# Patient Record
Sex: Male | Born: 1940 | Race: Black or African American | Hispanic: No | Smoking: Never smoker
Health system: Southern US, Community
[De-identification: ages and names within clinical notes are randomized; demographics above are authoritative.]

## PROBLEM LIST (undated history)

## (undated) DIAGNOSIS — Z992 Dependence on renal dialysis: Secondary | ICD-10-CM

## (undated) DIAGNOSIS — I10 Essential (primary) hypertension: Secondary | ICD-10-CM

## (undated) DIAGNOSIS — N189 Chronic kidney disease, unspecified: Secondary | ICD-10-CM

## (undated) DIAGNOSIS — N289 Disorder of kidney and ureter, unspecified: Secondary | ICD-10-CM

## (undated) DIAGNOSIS — C801 Malignant (primary) neoplasm, unspecified: Secondary | ICD-10-CM

## (undated) DIAGNOSIS — E119 Type 2 diabetes mellitus without complications: Secondary | ICD-10-CM

## (undated) DIAGNOSIS — A480 Gas gangrene: Principal | ICD-10-CM

## (undated) HISTORY — PX: PERITONEAL CATHETER INSERTION: SHX2223

## (undated) HISTORY — PX: EYE SURGERY: SHX253

## (undated) HISTORY — DX: Gas gangrene: A48.0

---

## 2011-01-28 ENCOUNTER — Inpatient Hospital Stay: Payer: Self-pay | Admitting: Internal Medicine

## 2011-12-01 ENCOUNTER — Ambulatory Visit: Payer: Self-pay | Admitting: Oncology

## 2011-12-06 LAB — CBC CANCER CENTER
Basophil #: 0.2 x10 3/mm — ABNORMAL HIGH (ref 0.0–0.1)
Basophil %: 0.7 %
Eosinophil #: 0.1 x10 3/mm (ref 0.0–0.7)
Eosinophil %: 0.4 %
HCT: 35.4 % — ABNORMAL LOW (ref 40.0–52.0)
HGB: 11.4 g/dL — ABNORMAL LOW (ref 13.0–18.0)
Lymphocyte #: 19.6 x10 3/mm — ABNORMAL HIGH (ref 1.0–3.6)
Lymphocyte %: 80.8 %
MCH: 28.5 pg (ref 26.0–34.0)
MCHC: 32.2 g/dL (ref 32.0–36.0)
MCV: 89 fL (ref 80–100)
Monocyte #: 0.9 x10 3/mm (ref 0.2–1.0)
Monocyte %: 3.7 %
Neutrophil #: 3.5 x10 3/mm (ref 1.4–6.5)
Neutrophil %: 14.4 %
Platelet: 114 x10 3/mm — ABNORMAL LOW (ref 150–440)
RBC: 4 10*6/uL — ABNORMAL LOW (ref 4.40–5.90)
RDW: 13.8 % (ref 11.5–14.5)
WBC: 24.3 x10 3/mm — ABNORMAL HIGH (ref 3.8–10.6)

## 2011-12-06 LAB — LACTATE DEHYDROGENASE: LDH: 320 U/L — ABNORMAL HIGH (ref 87–241)

## 2011-12-12 ENCOUNTER — Ambulatory Visit: Payer: Self-pay | Admitting: Nephrology

## 2011-12-12 LAB — COMPREHENSIVE METABOLIC PANEL
Albumin: 3.4 g/dL (ref 3.4–5.0)
Alkaline Phosphatase: 97 U/L (ref 50–136)
Anion Gap: 9 (ref 7–16)
BUN: 39 mg/dL — ABNORMAL HIGH (ref 7–18)
Bilirubin,Total: 0.3 mg/dL (ref 0.2–1.0)
Calcium, Total: 8.5 mg/dL (ref 8.5–10.1)
Chloride: 111 mmol/L — ABNORMAL HIGH (ref 98–107)
Co2: 22 mmol/L (ref 21–32)
Creatinine: 2.28 mg/dL — ABNORMAL HIGH (ref 0.60–1.30)
EGFR (African American): 32 — ABNORMAL LOW
EGFR (Non-African Amer.): 28 — ABNORMAL LOW
Glucose: 262 mg/dL — ABNORMAL HIGH (ref 65–99)
Osmolality: 302 (ref 275–301)
Potassium: 5 mmol/L (ref 3.5–5.1)
SGOT(AST): 36 U/L (ref 15–37)
SGPT (ALT): 54 U/L
Sodium: 142 mmol/L (ref 136–145)
Total Protein: 6.4 g/dL (ref 6.4–8.2)

## 2011-12-12 LAB — URINALYSIS, COMPLETE
Bacteria: NONE SEEN
Bilirubin,UR: NEGATIVE
Glucose,UR: 150 mg/dL (ref 0–75)
Granular Cast: 1
Hyaline Cast: 6
Ketone: NEGATIVE
Leukocyte Esterase: NEGATIVE
Nitrite: NEGATIVE
Ph: 5 (ref 4.5–8.0)
Protein: 100
RBC,UR: 4 /HPF (ref 0–5)
Specific Gravity: 1.011 (ref 1.003–1.030)
Squamous Epithelial: NONE SEEN
WBC UR: 1 /HPF (ref 0–5)

## 2011-12-12 LAB — CBC WITH DIFFERENTIAL/PLATELET
Basophil #: 0 10*3/uL (ref 0.0–0.1)
Basophil %: 0.1 %
Eosinophil #: 0.1 10*3/uL (ref 0.0–0.7)
Eosinophil %: 0.5 %
HCT: 34 % — ABNORMAL LOW (ref 40.0–52.0)
HGB: 10.8 g/dL — ABNORMAL LOW (ref 13.0–18.0)
Lymphocyte #: 21.2 10*3/uL — ABNORMAL HIGH (ref 1.0–3.6)
Lymphocyte %: 78.1 %
MCH: 28.2 pg (ref 26.0–34.0)
MCHC: 31.7 g/dL — ABNORMAL LOW (ref 32.0–36.0)
MCV: 89 fL (ref 80–100)
Monocyte #: 0.9 x10 3/mm (ref 0.2–1.0)
Monocyte %: 3.3 %
Neutrophil #: 4.9 10*3/uL (ref 1.4–6.5)
Neutrophil %: 18 %
Platelet: 115 10*3/uL — ABNORMAL LOW (ref 150–440)
RBC: 3.82 10*6/uL — ABNORMAL LOW (ref 4.40–5.90)
RDW: 13.6 % (ref 11.5–14.5)
WBC: 27.1 10*3/uL — ABNORMAL HIGH (ref 3.8–10.6)

## 2011-12-12 LAB — PROTEIN / CREATININE RATIO, URINE
Creatinine, Urine: 110.1 mg/dL (ref 30.0–125.0)
Protein, Random Urine: 136 mg/dL — ABNORMAL HIGH (ref 0–12)
Protein/Creat. Ratio: 1235 mg/gCREAT — ABNORMAL HIGH (ref 0–200)

## 2011-12-12 LAB — APTT: Activated PTT: 29.3 secs (ref 23.6–35.9)

## 2011-12-12 LAB — PROTIME-INR
INR: 1
Prothrombin Time: 13.5 secs (ref 11.5–14.7)

## 2011-12-14 ENCOUNTER — Observation Stay: Payer: Self-pay | Admitting: Nephrology

## 2011-12-14 LAB — HEMOGLOBIN: HGB: 10.1 g/dL — ABNORMAL LOW (ref 13.0–18.0)

## 2011-12-15 LAB — BASIC METABOLIC PANEL
Anion Gap: 10 (ref 7–16)
BUN: 52 mg/dL — ABNORMAL HIGH (ref 7–18)
Calcium, Total: 8 mg/dL — ABNORMAL LOW (ref 8.5–10.1)
Chloride: 116 mmol/L — ABNORMAL HIGH (ref 98–107)
Co2: 21 mmol/L (ref 21–32)
Creatinine: 2.76 mg/dL — ABNORMAL HIGH (ref 0.60–1.30)
EGFR (African American): 26 — ABNORMAL LOW
EGFR (Non-African Amer.): 22 — ABNORMAL LOW
Glucose: 157 mg/dL — ABNORMAL HIGH (ref 65–99)
Osmolality: 310 (ref 275–301)
Potassium: 5.6 mmol/L — ABNORMAL HIGH (ref 3.5–5.1)
Sodium: 147 mmol/L — ABNORMAL HIGH (ref 136–145)

## 2011-12-15 LAB — URINALYSIS, COMPLETE
Bacteria: NONE SEEN
Bilirubin,UR: NEGATIVE
Glucose,UR: 50 mg/dL (ref 0–75)
Ketone: NEGATIVE
Leukocyte Esterase: NEGATIVE
Nitrite: NEGATIVE
Ph: 5 (ref 4.5–8.0)
Protein: 100
RBC,UR: NONE SEEN /HPF (ref 0–5)
Specific Gravity: 1.01 (ref 1.003–1.030)
Squamous Epithelial: NONE SEEN
WBC UR: 1 /HPF (ref 0–5)

## 2011-12-15 LAB — CBC WITH DIFFERENTIAL/PLATELET
Comment - H1-Com1: NORMAL
Comment - H1-Com2: NORMAL
HCT: 28 % — ABNORMAL LOW (ref 40.0–52.0)
HGB: 9.2 g/dL — ABNORMAL LOW (ref 13.0–18.0)
Lymphocytes: 57 %
MCH: 28.9 pg (ref 26.0–34.0)
MCHC: 32.8 g/dL (ref 32.0–36.0)
MCV: 88 fL (ref 80–100)
Monocytes: 7 %
Platelet: 92 10*3/uL — ABNORMAL LOW (ref 150–440)
RBC: 3.17 10*6/uL — ABNORMAL LOW (ref 4.40–5.90)
RDW: 13.4 % (ref 11.5–14.5)
Segmented Neutrophils: 8 %
Variant Lymphocyte - H1-Rlymph: 28 %
WBC: 20.2 10*3/uL — ABNORMAL HIGH (ref 3.8–10.6)

## 2011-12-16 ENCOUNTER — Ambulatory Visit: Payer: Self-pay | Admitting: Oncology

## 2012-01-15 ENCOUNTER — Ambulatory Visit: Payer: Self-pay | Admitting: Oncology

## 2012-11-13 ENCOUNTER — Ambulatory Visit: Payer: Self-pay | Admitting: Oncology

## 2012-11-14 ENCOUNTER — Ambulatory Visit: Payer: Self-pay | Admitting: Oncology

## 2012-12-11 LAB — CANCER CENTER HEMOGLOBIN: HGB: 9.3 g/dL — ABNORMAL LOW (ref 13.0–18.0)

## 2012-12-11 LAB — IRON AND TIBC
Iron Bind.Cap.(Total): 272 ug/dL (ref 250–450)
Iron Saturation: 25 %
Iron: 69 ug/dL (ref 65–175)
Unbound Iron-Bind.Cap.: 203 ug/dL

## 2012-12-11 LAB — FOLATE: Folic Acid: 11 ng/mL (ref 3.1–100.0)

## 2012-12-11 LAB — FERRITIN: Ferritin (ARMC): 50 ng/mL (ref 8–388)

## 2012-12-15 ENCOUNTER — Ambulatory Visit: Payer: Self-pay | Admitting: Oncology

## 2012-12-29 LAB — BASIC METABOLIC PANEL
Anion Gap: 6 — ABNORMAL LOW (ref 7–16)
BUN: 48 mg/dL — ABNORMAL HIGH (ref 7–18)
Calcium, Total: 8.2 mg/dL — ABNORMAL LOW (ref 8.5–10.1)
Chloride: 116 mmol/L — ABNORMAL HIGH (ref 98–107)
Co2: 22 mmol/L (ref 21–32)
Creatinine: 3.65 mg/dL — ABNORMAL HIGH (ref 0.60–1.30)
EGFR (African American): 18 — ABNORMAL LOW
EGFR (Non-African Amer.): 16 — ABNORMAL LOW
Glucose: 139 mg/dL — ABNORMAL HIGH (ref 65–99)
Osmolality: 302 (ref 275–301)
Potassium: 5 mmol/L (ref 3.5–5.1)
Sodium: 144 mmol/L (ref 136–145)

## 2012-12-29 LAB — CBC
HCT: 29 % — ABNORMAL LOW (ref 40.0–52.0)
HGB: 9.4 g/dL — ABNORMAL LOW (ref 13.0–18.0)
MCH: 27.6 pg (ref 26.0–34.0)
MCHC: 32.3 g/dL (ref 32.0–36.0)
MCV: 86 fL (ref 80–100)
Platelet: 118 10*3/uL — ABNORMAL LOW (ref 150–440)
RBC: 3.39 10*6/uL — ABNORMAL LOW (ref 4.40–5.90)
RDW: 15.6 % — ABNORMAL HIGH (ref 11.5–14.5)
WBC: 20.3 10*3/uL — ABNORMAL HIGH (ref 3.8–10.6)

## 2012-12-29 LAB — TROPONIN I: Troponin-I: 0.06 ng/mL — ABNORMAL HIGH

## 2012-12-29 LAB — CK TOTAL AND CKMB (NOT AT ARMC)
CK, Total: 675 U/L — ABNORMAL HIGH (ref 35–232)
CK-MB: 13.6 ng/mL — ABNORMAL HIGH (ref 0.5–3.6)

## 2012-12-30 ENCOUNTER — Observation Stay: Payer: Self-pay | Admitting: Internal Medicine

## 2012-12-30 LAB — CBC WITH DIFFERENTIAL/PLATELET
Basophil #: 0.1 10*3/uL (ref 0.0–0.1)
Basophil %: 0.3 %
Eosinophil #: 0.1 10*3/uL (ref 0.0–0.7)
Eosinophil %: 0.6 %
HCT: 27.9 % — ABNORMAL LOW (ref 40.0–52.0)
HGB: 9 g/dL — ABNORMAL LOW (ref 13.0–18.0)
Lymphocyte #: 17.9 10*3/uL — ABNORMAL HIGH (ref 1.0–3.6)
Lymphocyte %: 77 %
MCH: 27.8 pg (ref 26.0–34.0)
MCHC: 32.4 g/dL (ref 32.0–36.0)
MCV: 86 fL (ref 80–100)
Monocyte #: 1.1 x10 3/mm — ABNORMAL HIGH (ref 0.2–1.0)
Monocyte %: 4.9 %
Neutrophil #: 4 10*3/uL (ref 1.4–6.5)
Neutrophil %: 17.2 %
Platelet: 110 10*3/uL — ABNORMAL LOW (ref 150–440)
RBC: 3.24 10*6/uL — ABNORMAL LOW (ref 4.40–5.90)
RDW: 15.5 % — ABNORMAL HIGH (ref 11.5–14.5)
WBC: 23.2 10*3/uL — ABNORMAL HIGH (ref 3.8–10.6)

## 2012-12-30 LAB — BASIC METABOLIC PANEL
Anion Gap: 6 — ABNORMAL LOW (ref 7–16)
Anion Gap: 6 — ABNORMAL LOW (ref 7–16)
BUN: 45 mg/dL — ABNORMAL HIGH (ref 7–18)
BUN: 41 mg/dL — ABNORMAL HIGH (ref 7–18)
Calcium, Total: 8.1 mg/dL — ABNORMAL LOW (ref 8.5–10.1)
Calcium, Total: 8.2 mg/dL — ABNORMAL LOW (ref 8.5–10.1)
Chloride: 117 mmol/L — ABNORMAL HIGH (ref 98–107)
Chloride: 116 mmol/L — ABNORMAL HIGH (ref 98–107)
Co2: 21 mmol/L (ref 21–32)
Co2: 20 mmol/L — ABNORMAL LOW (ref 21–32)
Creatinine: 3.42 mg/dL — ABNORMAL HIGH (ref 0.60–1.30)
Creatinine: 3.19 mg/dL — ABNORMAL HIGH (ref 0.60–1.30)
EGFR (African American): 20 — ABNORMAL LOW
EGFR (African American): 21 — ABNORMAL LOW
EGFR (Non-African Amer.): 17 — ABNORMAL LOW
EGFR (Non-African Amer.): 18 — ABNORMAL LOW
Glucose: 124 mg/dL — ABNORMAL HIGH (ref 65–99)
Glucose: 145 mg/dL — ABNORMAL HIGH (ref 65–99)
Osmolality: 300 (ref 275–301)
Osmolality: 296 (ref 275–301)
Potassium: 4.9 mmol/L (ref 3.5–5.1)
Potassium: 4.9 mmol/L (ref 3.5–5.1)
Sodium: 144 mmol/L (ref 136–145)
Sodium: 142 mmol/L (ref 136–145)

## 2012-12-30 LAB — URINALYSIS, COMPLETE
Bacteria: NONE SEEN
Bilirubin,UR: NEGATIVE
Glucose,UR: 50 mg/dL (ref 0–75)
Ketone: NEGATIVE
Leukocyte Esterase: NEGATIVE
Nitrite: NEGATIVE
Ph: 5 (ref 4.5–8.0)
Protein: >=500
RBC,UR: 6 /HPF (ref 0–5)
Specific Gravity: 1.012 (ref 1.003–1.030)
Squamous Epithelial: <1
WBC UR: <1 /HPF (ref 0–5)

## 2012-12-30 LAB — TROPONIN I
Troponin-I: 0.05 ng/mL
Troponin-I: 0.04 ng/mL

## 2012-12-30 LAB — CK TOTAL AND CKMB (NOT AT ARMC)
CK, Total: 616 U/L — ABNORMAL HIGH (ref 35–232)
CK, Total: 626 U/L — ABNORMAL HIGH (ref 35–232)
CK-MB: 13.3 ng/mL — ABNORMAL HIGH (ref 0.5–3.6)
CK-MB: 13.8 ng/mL — ABNORMAL HIGH (ref 0.5–3.6)

## 2012-12-31 LAB — BASIC METABOLIC PANEL
Anion Gap: 8 (ref 7–16)
Anion Gap: 5 — ABNORMAL LOW (ref 7–16)
BUN: 45 mg/dL — ABNORMAL HIGH (ref 7–18)
BUN: 43 mg/dL — ABNORMAL HIGH (ref 7–18)
Calcium, Total: 8.3 mg/dL — ABNORMAL LOW (ref 8.5–10.1)
Calcium, Total: 8.2 mg/dL — ABNORMAL LOW (ref 8.5–10.1)
Chloride: 114 mmol/L — ABNORMAL HIGH (ref 98–107)
Chloride: 114 mmol/L — ABNORMAL HIGH (ref 98–107)
Co2: 18 mmol/L — ABNORMAL LOW (ref 21–32)
Co2: 21 mmol/L (ref 21–32)
Creatinine: 3.34 mg/dL — ABNORMAL HIGH (ref 0.60–1.30)
Creatinine: 3.23 mg/dL — ABNORMAL HIGH (ref 0.60–1.30)
EGFR (African American): 20 — ABNORMAL LOW
EGFR (African American): 21 — ABNORMAL LOW
EGFR (Non-African Amer.): 17 — ABNORMAL LOW
EGFR (Non-African Amer.): 18 — ABNORMAL LOW
Glucose: 149 mg/dL — ABNORMAL HIGH (ref 65–99)
Glucose: 177 mg/dL — ABNORMAL HIGH (ref 65–99)
Osmolality: 294 (ref 275–301)
Osmolality: 295 (ref 275–301)
Potassium: 5.4 mmol/L — ABNORMAL HIGH (ref 3.5–5.1)
Potassium: 5.3 mmol/L — ABNORMAL HIGH (ref 3.5–5.1)
Sodium: 140 mmol/L (ref 136–145)
Sodium: 140 mmol/L (ref 136–145)

## 2012-12-31 LAB — PRO B NATRIURETIC PEPTIDE: B-Type Natriuretic Peptide: 1574 pg/mL — ABNORMAL HIGH (ref 0–125)

## 2013-01-01 LAB — BASIC METABOLIC PANEL
Anion Gap: 7 (ref 7–16)
BUN: 41 mg/dL — ABNORMAL HIGH (ref 7–18)
Calcium, Total: 8.2 mg/dL — ABNORMAL LOW (ref 8.5–10.1)
Chloride: 114 mmol/L — ABNORMAL HIGH (ref 98–107)
Co2: 23 mmol/L (ref 21–32)
Creatinine: 3.28 mg/dL — ABNORMAL HIGH (ref 0.60–1.30)
EGFR (African American): 21 — ABNORMAL LOW
EGFR (Non-African Amer.): 18 — ABNORMAL LOW
Glucose: 122 mg/dL — ABNORMAL HIGH (ref 65–99)
Osmolality: 298 (ref 275–301)
Potassium: 4.7 mmol/L (ref 3.5–5.1)
Sodium: 144 mmol/L (ref 136–145)

## 2013-01-14 ENCOUNTER — Ambulatory Visit: Payer: Self-pay | Admitting: Oncology

## 2013-01-27 ENCOUNTER — Observation Stay: Payer: Self-pay | Admitting: Internal Medicine

## 2013-01-27 LAB — URINALYSIS, COMPLETE
Bilirubin,UR: NEGATIVE
Glucose,UR: 150 mg/dL (ref 0–75)
Hyaline Cast: 22
Ketone: NEGATIVE
Leukocyte Esterase: NEGATIVE
Nitrite: NEGATIVE
Ph: 5 (ref 4.5–8.0)
Protein: >=500
RBC,UR: 15 /HPF (ref 0–5)
Specific Gravity: 1.013 (ref 1.003–1.030)
Squamous Epithelial: NONE SEEN
WBC UR: 2 /HPF (ref 0–5)

## 2013-01-27 LAB — COMPREHENSIVE METABOLIC PANEL
Albumin: 3 g/dL — ABNORMAL LOW (ref 3.4–5.0)
Alkaline Phosphatase: 70 U/L (ref 50–136)
Anion Gap: 3 — ABNORMAL LOW (ref 7–16)
BUN: 48 mg/dL — ABNORMAL HIGH (ref 7–18)
Bilirubin,Total: 0.3 mg/dL (ref 0.2–1.0)
Calcium, Total: 8.4 mg/dL — ABNORMAL LOW (ref 8.5–10.1)
Chloride: 109 mmol/L — ABNORMAL HIGH (ref 98–107)
Co2: 27 mmol/L (ref 21–32)
Creatinine: 4.28 mg/dL — ABNORMAL HIGH (ref 0.60–1.30)
EGFR (African American): 15 — ABNORMAL LOW
EGFR (Non-African Amer.): 13 — ABNORMAL LOW
Glucose: 205 mg/dL — ABNORMAL HIGH (ref 65–99)
Osmolality: 296 (ref 275–301)
Potassium: 5.6 mmol/L — ABNORMAL HIGH (ref 3.5–5.1)
SGOT(AST): 15 U/L (ref 15–37)
SGPT (ALT): 25 U/L (ref 12–78)
Sodium: 139 mmol/L (ref 136–145)
Total Protein: 5.9 g/dL — ABNORMAL LOW (ref 6.4–8.2)

## 2013-01-27 LAB — TROPONIN I: Troponin-I: 0.04 ng/mL

## 2013-01-27 LAB — CBC
HCT: 32.8 % — ABNORMAL LOW (ref 40.0–52.0)
HGB: 10.4 g/dL — ABNORMAL LOW (ref 13.0–18.0)
MCH: 26.9 pg (ref 26.0–34.0)
MCHC: 31.8 g/dL — ABNORMAL LOW (ref 32.0–36.0)
MCV: 85 fL (ref 80–100)
Platelet: 120 10*3/uL — ABNORMAL LOW (ref 150–440)
RBC: 3.87 10*6/uL — ABNORMAL LOW (ref 4.40–5.90)
RDW: 15.3 % — ABNORMAL HIGH (ref 11.5–14.5)
WBC: 22.3 10*3/uL — ABNORMAL HIGH (ref 3.8–10.6)

## 2013-01-27 LAB — HEMOGLOBIN A1C: Hemoglobin A1C: 7.5 % — ABNORMAL HIGH (ref 4.2–6.3)

## 2013-01-27 LAB — CK TOTAL AND CKMB (NOT AT ARMC)
CK, Total: 307 U/L — ABNORMAL HIGH (ref 35–232)
CK-MB: 7.3 ng/mL — ABNORMAL HIGH (ref 0.5–3.6)

## 2013-01-27 LAB — LIPASE, BLOOD: Lipase: 362 U/L (ref 73–393)

## 2013-01-28 LAB — BASIC METABOLIC PANEL
Anion Gap: 4 — ABNORMAL LOW (ref 7–16)
Anion Gap: 6 — ABNORMAL LOW (ref 7–16)
BUN: 49 mg/dL — ABNORMAL HIGH (ref 7–18)
BUN: 48 mg/dL — ABNORMAL HIGH (ref 7–18)
Calcium, Total: 7.9 mg/dL — ABNORMAL LOW (ref 8.5–10.1)
Calcium, Total: 8.1 mg/dL — ABNORMAL LOW (ref 8.5–10.1)
Chloride: 109 mmol/L — ABNORMAL HIGH (ref 98–107)
Chloride: 108 mmol/L — ABNORMAL HIGH (ref 98–107)
Co2: 26 mmol/L (ref 21–32)
Co2: 24 mmol/L (ref 21–32)
Creatinine: 4.13 mg/dL — ABNORMAL HIGH (ref 0.60–1.30)
Creatinine: 3.82 mg/dL — ABNORMAL HIGH (ref 0.60–1.30)
EGFR (African American): 16 — ABNORMAL LOW
EGFR (African American): 17 — ABNORMAL LOW
EGFR (Non-African Amer.): 13 — ABNORMAL LOW
EGFR (Non-African Amer.): 15 — ABNORMAL LOW
Glucose: 152 mg/dL — ABNORMAL HIGH (ref 65–99)
Glucose: 169 mg/dL — ABNORMAL HIGH (ref 65–99)
Osmolality: 293 (ref 275–301)
Osmolality: 292 (ref 275–301)
Potassium: 5.6 mmol/L — ABNORMAL HIGH (ref 3.5–5.1)
Potassium: 5.4 mmol/L — ABNORMAL HIGH (ref 3.5–5.1)
Sodium: 139 mmol/L (ref 136–145)
Sodium: 138 mmol/L (ref 136–145)

## 2013-01-28 LAB — CBC WITH DIFFERENTIAL/PLATELET
Comment - H1-Com2: NORMAL
Eosinophil: 2 %
HCT: 29 % — ABNORMAL LOW (ref 40.0–52.0)
HGB: 9.5 g/dL — ABNORMAL LOW (ref 13.0–18.0)
Lymphocytes: 77 %
MCH: 27.4 pg (ref 26.0–34.0)
MCHC: 32.6 g/dL (ref 32.0–36.0)
MCV: 84 fL (ref 80–100)
Monocytes: 2 %
Platelet: 100 10*3/uL — ABNORMAL LOW (ref 150–440)
RBC: 3.45 10*6/uL — ABNORMAL LOW (ref 4.40–5.90)
RDW: 15.2 % — ABNORMAL HIGH (ref 11.5–14.5)
Segmented Neutrophils: 15 %
Variant Lymphocyte - H1-Rlymph: 4 %
WBC: 18.9 10*3/uL — ABNORMAL HIGH (ref 3.8–10.6)

## 2013-01-28 LAB — MAGNESIUM: Magnesium: 2.2 mg/dL

## 2013-01-29 LAB — BASIC METABOLIC PANEL
Anion Gap: 5 — ABNORMAL LOW (ref 7–16)
BUN: 47 mg/dL — ABNORMAL HIGH (ref 7–18)
Calcium, Total: 8 mg/dL — ABNORMAL LOW (ref 8.5–10.1)
Chloride: 111 mmol/L — ABNORMAL HIGH (ref 98–107)
Co2: 25 mmol/L (ref 21–32)
Creatinine: 3.65 mg/dL — ABNORMAL HIGH (ref 0.60–1.30)
EGFR (African American): 18 — ABNORMAL LOW
EGFR (Non-African Amer.): 16 — ABNORMAL LOW
Glucose: 133 mg/dL — ABNORMAL HIGH (ref 65–99)
Osmolality: 295 (ref 275–301)
Potassium: 4.9 mmol/L (ref 3.5–5.1)
Sodium: 141 mmol/L (ref 136–145)

## 2013-02-13 ENCOUNTER — Ambulatory Visit: Payer: Self-pay | Admitting: Family Medicine

## 2013-05-06 ENCOUNTER — Ambulatory Visit: Payer: Self-pay | Admitting: Oncology

## 2013-05-06 LAB — CBC CANCER CENTER
Basophil #: 0 x10 3/mm (ref 0.0–0.1)
Basophil %: 0.3 %
Eosinophil #: 0.1 x10 3/mm (ref 0.0–0.7)
Eosinophil %: 0.9 %
HCT: 20.6 % — ABNORMAL LOW (ref 40.0–52.0)
HGB: 6.6 g/dL — ABNORMAL LOW (ref 13.0–18.0)
Lymphocyte #: 10.6 x10 3/mm — ABNORMAL HIGH (ref 1.0–3.6)
Lymphocyte %: 70.5 %
MCH: 27.9 pg (ref 26.0–34.0)
MCHC: 32.3 g/dL (ref 32.0–36.0)
MCV: 87 fL (ref 80–100)
Monocyte #: 1 x10 3/mm (ref 0.2–1.0)
Monocyte %: 6.4 %
Neutrophil #: 3.3 x10 3/mm (ref 1.4–6.5)
Neutrophil %: 21.9 %
Platelet: 134 x10 3/mm — ABNORMAL LOW (ref 150–440)
RBC: 2.38 10*6/uL — ABNORMAL LOW (ref 4.40–5.90)
RDW: 17.6 % — ABNORMAL HIGH (ref 11.5–14.5)
WBC: 15 x10 3/mm — ABNORMAL HIGH (ref 3.8–10.6)

## 2013-05-06 LAB — IRON AND TIBC
Iron Bind.Cap.(Total): 257 ug/dL (ref 250–450)
Iron Saturation: 23 %
Iron: 60 ug/dL — ABNORMAL LOW (ref 65–175)
Unbound Iron-Bind.Cap.: 197 ug/dL

## 2013-05-06 LAB — FERRITIN: Ferritin (ARMC): 120 ng/mL (ref 8–388)

## 2013-05-07 ENCOUNTER — Ambulatory Visit: Payer: Self-pay | Admitting: Family Medicine

## 2013-05-08 ENCOUNTER — Ambulatory Visit: Payer: Self-pay | Admitting: Vascular Surgery

## 2013-05-08 LAB — CBC
HCT: 24.7 % — ABNORMAL LOW (ref 40.0–52.0)
HGB: 8.1 g/dL — ABNORMAL LOW (ref 13.0–18.0)
MCH: 28 pg (ref 26.0–34.0)
MCHC: 32.7 g/dL (ref 32.0–36.0)
MCV: 85 fL (ref 80–100)
Platelet: 135 10*3/uL — ABNORMAL LOW (ref 150–440)
RBC: 2.89 10*6/uL — ABNORMAL LOW (ref 4.40–5.90)
RDW: 18.1 % — ABNORMAL HIGH (ref 11.5–14.5)
WBC: 17.4 10*3/uL — ABNORMAL HIGH (ref 3.8–10.6)

## 2013-05-08 LAB — BASIC METABOLIC PANEL
Anion Gap: 7 (ref 7–16)
BUN: 65 mg/dL — ABNORMAL HIGH (ref 7–18)
Calcium, Total: 8.5 mg/dL (ref 8.5–10.1)
Chloride: 112 mmol/L — ABNORMAL HIGH (ref 98–107)
Co2: 23 mmol/L (ref 21–32)
Creatinine: 4.79 mg/dL — ABNORMAL HIGH (ref 0.60–1.30)
EGFR (African American): 13 — ABNORMAL LOW
EGFR (Non-African Amer.): 11 — ABNORMAL LOW
Glucose: 190 mg/dL — ABNORMAL HIGH (ref 65–99)
Osmolality: 307 (ref 275–301)
Potassium: 5 mmol/L (ref 3.5–5.1)
Sodium: 142 mmol/L (ref 136–145)

## 2013-05-14 ENCOUNTER — Ambulatory Visit: Payer: Self-pay | Admitting: Vascular Surgery

## 2013-05-17 ENCOUNTER — Ambulatory Visit: Payer: Self-pay | Admitting: Oncology

## 2013-05-19 LAB — CBC CANCER CENTER
Basophil #: 0.1 x10 3/mm (ref 0.0–0.1)
Basophil %: 0.5 %
Eosinophil #: 0.1 x10 3/mm (ref 0.0–0.7)
Eosinophil %: 0.7 %
HCT: 24.1 % — ABNORMAL LOW (ref 40.0–52.0)
HGB: 7.9 g/dL — ABNORMAL LOW (ref 13.0–18.0)
Lymphocyte #: 11.6 x10 3/mm — ABNORMAL HIGH (ref 1.0–3.6)
Lymphocyte %: 67.3 %
MCH: 27.9 pg (ref 26.0–34.0)
MCHC: 32.5 g/dL (ref 32.0–36.0)
MCV: 86 fL (ref 80–100)
Monocyte #: 1 x10 3/mm (ref 0.2–1.0)
Monocyte %: 5.7 %
Neutrophil #: 4.5 x10 3/mm (ref 1.4–6.5)
Neutrophil %: 25.8 %
Platelet: 127 x10 3/mm — ABNORMAL LOW (ref 150–440)
RBC: 2.81 10*6/uL — ABNORMAL LOW (ref 4.40–5.90)
RDW: 16.7 % — ABNORMAL HIGH (ref 11.5–14.5)
WBC: 17.3 x10 3/mm — ABNORMAL HIGH (ref 3.8–10.6)

## 2013-06-03 ENCOUNTER — Inpatient Hospital Stay: Payer: Self-pay | Admitting: Internal Medicine

## 2013-06-03 LAB — COMPREHENSIVE METABOLIC PANEL
Albumin: 3 g/dL — ABNORMAL LOW (ref 3.4–5.0)
Alkaline Phosphatase: 75 U/L (ref 50–136)
Anion Gap: 7 (ref 7–16)
BUN: 83 mg/dL — ABNORMAL HIGH (ref 7–18)
Bilirubin,Total: 0.3 mg/dL (ref 0.2–1.0)
Calcium, Total: 8.1 mg/dL — ABNORMAL LOW (ref 8.5–10.1)
Chloride: 113 mmol/L — ABNORMAL HIGH (ref 98–107)
Co2: 20 mmol/L — ABNORMAL LOW (ref 21–32)
Creatinine: 6.3 mg/dL — ABNORMAL HIGH (ref 0.60–1.30)
EGFR (African American): 9 — ABNORMAL LOW
EGFR (Non-African Amer.): 8 — ABNORMAL LOW
Glucose: 186 mg/dL — ABNORMAL HIGH (ref 65–99)
Osmolality: 309 (ref 275–301)
Potassium: 6.7 mmol/L (ref 3.5–5.1)
SGOT(AST): 24 U/L (ref 15–37)
SGPT (ALT): 28 U/L (ref 12–78)
Sodium: 140 mmol/L (ref 136–145)
Total Protein: 5.7 g/dL — ABNORMAL LOW (ref 6.4–8.2)

## 2013-06-03 LAB — CBC WITH DIFFERENTIAL/PLATELET
Basophil #: 0.1 10*3/uL (ref 0.0–0.1)
Basophil %: 1.1 %
Eosinophil #: 0.1 10*3/uL (ref 0.0–0.7)
Eosinophil %: 0.9 %
HCT: 23.7 % — ABNORMAL LOW (ref 40.0–52.0)
HGB: 7.9 g/dL — ABNORMAL LOW (ref 13.0–18.0)
Lymphocyte #: 8.9 10*3/uL — ABNORMAL HIGH (ref 1.0–3.6)
Lymphocyte %: 67.4 %
MCH: 28.9 pg (ref 26.0–34.0)
MCHC: 33.4 g/dL (ref 32.0–36.0)
MCV: 87 fL (ref 80–100)
Monocyte #: 0.7 x10 3/mm (ref 0.2–1.0)
Monocyte %: 5.1 %
Neutrophil #: 3.4 10*3/uL (ref 1.4–6.5)
Neutrophil %: 25.5 %
Platelet: 119 10*3/uL — ABNORMAL LOW (ref 150–440)
RBC: 2.74 10*6/uL — ABNORMAL LOW (ref 4.40–5.90)
RDW: 17.3 % — ABNORMAL HIGH (ref 11.5–14.5)
WBC: 13.2 10*3/uL — ABNORMAL HIGH (ref 3.8–10.6)

## 2013-06-03 LAB — URINALYSIS, COMPLETE
Bacteria: NONE SEEN
Bilirubin,UR: NEGATIVE
Glucose,UR: 50 mg/dL (ref 0–75)
Hyaline Cast: 3
Ketone: NEGATIVE
Leukocyte Esterase: NEGATIVE
Nitrite: NEGATIVE
Ph: 5 (ref 4.5–8.0)
Protein: 100
RBC,UR: 2 /HPF (ref 0–5)
Specific Gravity: 1.009 (ref 1.003–1.030)
Squamous Epithelial: <1
WBC UR: 1 /HPF (ref 0–5)

## 2013-06-03 LAB — TROPONIN I
Troponin-I: 0.04 ng/mL
Troponin-I: 0.04 ng/mL

## 2013-06-03 LAB — POTASSIUM: Potassium: 6 mmol/L — ABNORMAL HIGH (ref 3.5–5.1)

## 2013-06-03 LAB — PRO B NATRIURETIC PEPTIDE: B-Type Natriuretic Peptide: 11011 pg/mL — ABNORMAL HIGH (ref 0–125)

## 2013-06-04 LAB — CBC WITH DIFFERENTIAL/PLATELET
Basophil #: 0.1 10*3/uL (ref 0.0–0.1)
Basophil %: 0.6 %
Eosinophil #: 0.1 10*3/uL (ref 0.0–0.7)
Eosinophil %: 0.6 %
HCT: 24.7 % — ABNORMAL LOW (ref 40.0–52.0)
HGB: 8 g/dL — ABNORMAL LOW (ref 13.0–18.0)
Lymphocyte #: 10.7 10*3/uL — ABNORMAL HIGH (ref 1.0–3.6)
Lymphocyte %: 68.4 %
MCH: 27.9 pg (ref 26.0–34.0)
MCHC: 32.2 g/dL (ref 32.0–36.0)
MCV: 87 fL (ref 80–100)
Monocyte #: 1 x10 3/mm (ref 0.2–1.0)
Monocyte %: 6.3 %
Neutrophil #: 3.8 10*3/uL (ref 1.4–6.5)
Neutrophil %: 24.1 %
Platelet: 120 10*3/uL — ABNORMAL LOW (ref 150–440)
RBC: 2.85 10*6/uL — ABNORMAL LOW (ref 4.40–5.90)
RDW: 16.9 % — ABNORMAL HIGH (ref 11.5–14.5)
WBC: 15.6 10*3/uL — ABNORMAL HIGH (ref 3.8–10.6)

## 2013-06-04 LAB — BASIC METABOLIC PANEL
Anion Gap: 7 (ref 7–16)
BUN: 81 mg/dL — ABNORMAL HIGH (ref 7–18)
Calcium, Total: 8.2 mg/dL — ABNORMAL LOW (ref 8.5–10.1)
Chloride: 114 mmol/L — ABNORMAL HIGH (ref 98–107)
Co2: 21 mmol/L (ref 21–32)
Creatinine: 6.27 mg/dL — ABNORMAL HIGH (ref 0.60–1.30)
EGFR (African American): 9 — ABNORMAL LOW
EGFR (Non-African Amer.): 8 — ABNORMAL LOW
Glucose: 120 mg/dL — ABNORMAL HIGH (ref 65–99)
Osmolality: 309 (ref 275–301)
Potassium: 5.8 mmol/L — ABNORMAL HIGH (ref 3.5–5.1)
Sodium: 142 mmol/L (ref 136–145)

## 2013-06-04 LAB — HEMOGLOBIN A1C: Hemoglobin A1C: 5.9 % (ref 4.2–6.3)

## 2013-06-04 LAB — LIPID PANEL
Cholesterol: 198 mg/dL (ref 0–200)
HDL Cholesterol: 44 mg/dL (ref 40–60)
Ldl Cholesterol, Calc: 137 mg/dL — ABNORMAL HIGH (ref 0–100)
Triglycerides: 87 mg/dL (ref 0–200)
VLDL Cholesterol, Calc: 17 mg/dL (ref 5–40)

## 2013-06-04 LAB — IRON AND TIBC
Iron Bind.Cap.(Total): 246 ug/dL — ABNORMAL LOW (ref 250–450)
Iron Saturation: 26 %
Iron: 63 ug/dL — ABNORMAL LOW (ref 65–175)
Unbound Iron-Bind.Cap.: 183 ug/dL

## 2013-06-04 LAB — FERRITIN: Ferritin (ARMC): 88 ng/mL (ref 8–388)

## 2013-06-04 LAB — PHOSPHORUS: Phosphorus: 5.5 mg/dL — ABNORMAL HIGH (ref 2.5–4.9)

## 2013-06-04 LAB — URIC ACID: Uric Acid: 6.7 mg/dL (ref 3.5–7.2)

## 2013-06-04 LAB — TROPONIN I: Troponin-I: 0.04 ng/mL

## 2013-06-05 LAB — BASIC METABOLIC PANEL
Anion Gap: 7 (ref 7–16)
BUN: 79 mg/dL — ABNORMAL HIGH (ref 7–18)
Calcium, Total: 8.3 mg/dL — ABNORMAL LOW (ref 8.5–10.1)
Chloride: 112 mmol/L — ABNORMAL HIGH (ref 98–107)
Co2: 23 mmol/L (ref 21–32)
Creatinine: 6.44 mg/dL — ABNORMAL HIGH (ref 0.60–1.30)
EGFR (African American): 9 — ABNORMAL LOW
EGFR (Non-African Amer.): 8 — ABNORMAL LOW
Glucose: 100 mg/dL — ABNORMAL HIGH (ref 65–99)
Osmolality: 307 (ref 275–301)
Potassium: 5.1 mmol/L (ref 3.5–5.1)
Sodium: 142 mmol/L (ref 136–145)

## 2013-06-16 ENCOUNTER — Ambulatory Visit: Payer: Self-pay | Admitting: Oncology

## 2013-07-07 ENCOUNTER — Ambulatory Visit: Payer: Self-pay | Admitting: Vascular Surgery

## 2013-07-17 HISTORY — PX: AV FISTULA PLACEMENT: SHX1204

## 2013-07-25 ENCOUNTER — Ambulatory Visit: Payer: Self-pay | Admitting: Vascular Surgery

## 2014-03-02 DIAGNOSIS — E11319 Type 2 diabetes mellitus with unspecified diabetic retinopathy without macular edema: Secondary | ICD-10-CM | POA: Insufficient documentation

## 2014-03-02 DIAGNOSIS — H4311 Vitreous hemorrhage, right eye: Secondary | ICD-10-CM | POA: Insufficient documentation

## 2014-04-11 ENCOUNTER — Inpatient Hospital Stay: Payer: Self-pay | Admitting: Internal Medicine

## 2014-04-11 LAB — URINALYSIS, COMPLETE
Bacteria: NONE SEEN
Bilirubin,UR: NEGATIVE
Glucose,UR: 150 mg/dL (ref 0–75)
Ketone: NEGATIVE
Leukocyte Esterase: NEGATIVE
Nitrite: NEGATIVE
Ph: 7 (ref 4.5–8.0)
Protein: >=500
RBC,UR: 7 /HPF (ref 0–5)
Specific Gravity: 1.012 (ref 1.003–1.030)
Squamous Epithelial: NONE SEEN
WBC UR: <1 /HPF (ref 0–5)

## 2014-04-11 LAB — CBC WITH DIFFERENTIAL/PLATELET
Basophil #: 0.2 10*3/uL — ABNORMAL HIGH (ref 0.0–0.1)
Basophil %: 0.8 %
Eosinophil #: 0.1 10*3/uL (ref 0.0–0.7)
Eosinophil %: 0.8 %
HCT: 38.1 % — ABNORMAL LOW (ref 40.0–52.0)
HGB: 12.1 g/dL — ABNORMAL LOW (ref 13.0–18.0)
Lymphocyte #: 11.8 10*3/uL — ABNORMAL HIGH (ref 1.0–3.6)
Lymphocyte %: 63.9 %
MCH: 29.7 pg (ref 26.0–34.0)
MCHC: 31.7 g/dL — ABNORMAL LOW (ref 32.0–36.0)
MCV: 94 fL (ref 80–100)
Monocyte #: 1.2 x10 3/mm — ABNORMAL HIGH (ref 0.2–1.0)
Monocyte %: 6.3 %
Neutrophil #: 5.2 10*3/uL (ref 1.4–6.5)
Neutrophil %: 28.2 %
Platelet: 149 10*3/uL — ABNORMAL LOW (ref 150–440)
RBC: 4.07 10*6/uL — ABNORMAL LOW (ref 4.40–5.90)
RDW: 19.2 % — ABNORMAL HIGH (ref 11.5–14.5)
WBC: 18.6 10*3/uL — ABNORMAL HIGH (ref 3.8–10.6)

## 2014-04-11 LAB — COMPREHENSIVE METABOLIC PANEL
Albumin: 3.2 g/dL — ABNORMAL LOW (ref 3.4–5.0)
Alkaline Phosphatase: 91 U/L
Anion Gap: 8 (ref 7–16)
BUN: 79 mg/dL — ABNORMAL HIGH (ref 7–18)
Bilirubin,Total: 0.6 mg/dL (ref 0.2–1.0)
Calcium, Total: 7.5 mg/dL — ABNORMAL LOW (ref 8.5–10.1)
Chloride: 109 mmol/L — ABNORMAL HIGH (ref 98–107)
Co2: 24 mmol/L (ref 21–32)
Creatinine: 10.4 mg/dL — ABNORMAL HIGH (ref 0.60–1.30)
EGFR (African American): 6 — ABNORMAL LOW
EGFR (Non-African Amer.): 5 — ABNORMAL LOW
Glucose: 113 mg/dL — ABNORMAL HIGH (ref 65–99)
Osmolality: 306 (ref 275–301)
Potassium: 5.1 mmol/L (ref 3.5–5.1)
SGOT(AST): 39 U/L — ABNORMAL HIGH (ref 15–37)
SGPT (ALT): 50 U/L
Sodium: 141 mmol/L (ref 136–145)
Total Protein: 6.4 g/dL (ref 6.4–8.2)

## 2014-04-11 LAB — CK-MB
CK-MB: 19.1 ng/mL — ABNORMAL HIGH (ref 0.5–3.6)
CK-MB: 16.8 ng/mL — ABNORMAL HIGH (ref 0.5–3.6)

## 2014-04-11 LAB — TROPONIN I
Troponin-I: 0.17 ng/mL — ABNORMAL HIGH
Troponin-I: 0.15 ng/mL — ABNORMAL HIGH

## 2014-04-11 LAB — LIPASE, BLOOD: Lipase: 173 U/L (ref 73–393)

## 2014-04-12 LAB — CBC WITH DIFFERENTIAL/PLATELET
Bands: 3 %
Comment - H1-Com2: NORMAL
Eosinophil: 1 %
HCT: 33.5 % — ABNORMAL LOW (ref 40.0–52.0)
HGB: 10.6 g/dL — ABNORMAL LOW (ref 13.0–18.0)
Lymphocytes: 69 %
MCH: 29.9 pg (ref 26.0–34.0)
MCHC: 31.8 g/dL — ABNORMAL LOW (ref 32.0–36.0)
MCV: 94 fL (ref 80–100)
Monocytes: 4 %
Platelet: 123 10*3/uL — ABNORMAL LOW (ref 150–440)
RBC: 3.56 10*6/uL — ABNORMAL LOW (ref 4.40–5.90)
RDW: 19.2 % — ABNORMAL HIGH (ref 11.5–14.5)
Segmented Neutrophils: 23 %
WBC: 17.6 10*3/uL — ABNORMAL HIGH (ref 3.8–10.6)

## 2014-04-12 LAB — BASIC METABOLIC PANEL
Anion Gap: 8 (ref 7–16)
BUN: 83 mg/dL — ABNORMAL HIGH (ref 7–18)
Calcium, Total: 7.2 mg/dL — ABNORMAL LOW (ref 8.5–10.1)
Chloride: 112 mmol/L — ABNORMAL HIGH (ref 98–107)
Co2: 22 mmol/L (ref 21–32)
Creatinine: 10.57 mg/dL — ABNORMAL HIGH (ref 0.60–1.30)
EGFR (African American): 6 — ABNORMAL LOW
EGFR (Non-African Amer.): 5 — ABNORMAL LOW
Glucose: 80 mg/dL (ref 65–99)
Osmolality: 307 (ref 275–301)
Potassium: 5.6 mmol/L — ABNORMAL HIGH (ref 3.5–5.1)
Sodium: 142 mmol/L (ref 136–145)

## 2014-04-12 LAB — CLOSTRIDIUM DIFFICILE(ARMC)

## 2014-04-12 LAB — TROPONIN I: Troponin-I: 0.16 ng/mL — ABNORMAL HIGH

## 2014-04-12 LAB — CK-MB: CK-MB: 13.7 ng/mL — ABNORMAL HIGH (ref 0.5–3.6)

## 2014-04-12 LAB — POTASSIUM: Potassium: 5.2 mmol/L — ABNORMAL HIGH (ref 3.5–5.1)

## 2014-04-13 LAB — BASIC METABOLIC PANEL
Anion Gap: 7 (ref 7–16)
BUN: 79 mg/dL — ABNORMAL HIGH (ref 7–18)
Calcium, Total: 6.8 mg/dL — CL (ref 8.5–10.1)
Chloride: 109 mmol/L — ABNORMAL HIGH (ref 98–107)
Co2: 24 mmol/L (ref 21–32)
Creatinine: 9.83 mg/dL — ABNORMAL HIGH (ref 0.60–1.30)
EGFR (African American): 7 — ABNORMAL LOW
EGFR (Non-African Amer.): 6 — ABNORMAL LOW
Glucose: 139 mg/dL — ABNORMAL HIGH (ref 65–99)
Osmolality: 305 (ref 275–301)
Potassium: 4.6 mmol/L (ref 3.5–5.1)
Sodium: 140 mmol/L (ref 136–145)

## 2014-04-15 LAB — STOOL CULTURE

## 2014-07-13 DIAGNOSIS — I10 Essential (primary) hypertension: Secondary | ICD-10-CM | POA: Insufficient documentation

## 2014-07-13 DIAGNOSIS — E1122 Type 2 diabetes mellitus with diabetic chronic kidney disease: Secondary | ICD-10-CM | POA: Insufficient documentation

## 2014-07-13 DIAGNOSIS — N186 End stage renal disease: Secondary | ICD-10-CM | POA: Insufficient documentation

## 2014-07-13 DIAGNOSIS — E1142 Type 2 diabetes mellitus with diabetic polyneuropathy: Secondary | ICD-10-CM | POA: Insufficient documentation

## 2014-07-13 DIAGNOSIS — N185 Chronic kidney disease, stage 5: Secondary | ICD-10-CM | POA: Insufficient documentation

## 2014-07-13 DIAGNOSIS — Z992 Dependence on renal dialysis: Secondary | ICD-10-CM

## 2014-11-06 NOTE — Consult Note (Signed)
Referring Physician:  Loletha Grayer :   Primary Care Physician:  [name] :   Reason for Consult: Admit Date: 03-Jun-2013  Chief Complaint: tremor  Reason for Consult: tremor   History of Present Illness: History of Present Illness:   74 year old man who is going to be started on peritoneal dialysis for ESRD presents with lethargy and fatigue and was found to have severe hyperkalemia with a potassium level at 6.7.  His Cr was also found to be elevated at 6.3.  Neurology asked to consult regarding tremor.  Patient says tremor in right hand worse than left.  Worse when using hand, such as using utensil or hold glass.  No resting tremor.  No head tremor.  Tremors come and go with activity.  Nothing makes them better in particular.  Each tremor is short lasting and lasts as long as the postural hand activity is going on for.  Denies family history of similar tremor.  No pain in the arms.  No recent arm injuries.  Not sure how long his tremor has been going on for.   MEDICAL HISTORY: End-stage renal disease. hyperparathyroidism, acidosis,shingles, of chronic disease.  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 with his wife who has cancer.  HISTORY: 61, living, with diabetes and hypertension. died at 26 of COPD. He was a smoker.  calcitriol 0.25 mcg daily, 12.5 mg twice a day, 0.3 mg twice a day, 20 mg daily,ER 90 mg at bedtime, 5/325 mg 1 to 2 tablets every 6 hours as needed, bicarb 650 mg 2 tablets twice a day.     ROS:  General denies complaints   HEENT no complaints   Lungs no complaints   Cardiac no complaints   GI no complaints   GU no complaints   Musculoskeletal no complaints   Extremities no complaints   Skin no complaints   Endocrine no complaints   Psych no complaints   Past Medical/Surgical Hx:  kidne:   elevated wbc:   ulcers:   anxiety:   HTN:   Diabetes:   Home Medications: Medication  Instructions Last Modified Date/Time  sodium bicarbonate 650 mg oral tablet 2 tab(s) orally 2 times a day 18-Nov-14 15:29  calcitriol 0.25 mcg oral capsule 1 cap(s) orally once a day (in the morning) 18-Nov-14 15:29  cloNIDine 0.3 mg oral tablet 1 tab(s) orally 2 times a day 18-Nov-14 15:29  NIFEdipine extended release 90 mg oral tablet, extended release 1 tab(s) orally every other day (at bedtime) 18-Nov-14 15:29  carvedilol 12.5 mg oral tablet 1 tab(s) orally 2 times a day 18-Nov-14 15:29  furosemide 20 mg oral tablet 1 tab(s) orally once a day (in the morning) 18-Nov-14 15:29   [facility] Current Meds:   Acetaminophen * tablet, ( Tylenol (325 mg) tablet)  650 mg Oral q4h PRN for pain or temp. greater than 100.4  - Indication: Pain/Fever  Ondansetron injection, ( Zofran injection )  4 mg, IV push, q4h PRN for Nausea/Vomiting  Indication: Nausea/ Vomiting  Acetaminophen-oxyCODONE 325/5 mg tablet, ( Tylox 5-325)  1 to 2 tablet(s) Oral q6h PRN for pain  - Indication: Pain  Instructions:  [Med Admin Window: 30 mins before or after scheduled dose]  cloNIDine  tablet, ( Catapres)  0.3 mg Oral bid  - Indication: Hypertension/ Withdrawal Symptoms  Sodium Bicarbonate tablet, ( Sod Bicarbonte)  1300 mg Oral bid  Calcitriol capsule, ( Rocaltrol)  0.25 mcg Oral daily  Carvedilol tablet, ( Coreg)  12.5 mg Oral bid  - Indication: CHF/ Hypertension  NIFEdipine ER tablet, ( Adalat CC)  90 mg Oral daily  - Indication: Angina/ Hypertension  Instructions:  DO NOT CRUSH  Furosemide injection,  ( Lasix injection )  80 mg, IV push, bid  Indication: Diuresis  Insulin SS -Novolog injection, Subcutaneous, FSBS before meals and at bedtime  0 units if FSBS 0-150     2 unit(s) if FSBS 151 - 200     4 unit(s) if FSBS 201 - 250     6 unit(s) if FSBS 251 - 300     8 unit(s) if FSBS 301 - 350     10 unit(s) if FSBS 351 - 400  Call MD if FSBS is greater than 400, [Waste Code: Black]  Nursing Saline Flush,  3 to 6 ml, IV push, Q1M PRN for IV Maintenance  ALPRAZolam tablet, 0.25 mg Oral q8h PRN for anxiety  - Indication: Anxiety/ Depression/ Panic  Instructions:  for anxiety  Epoetin Alfa injection,  ( Procrit injection )  20000 unit(s), Subcutaneous, once  Indication: Colony Stimulating Factor  Gentamicin 0.1% Cream, ( Garamycin 0.1% cream )  Apply to affected area once  Instructions: to be applied by PD nurse to PD cathter exit site.  Allergies:  losartan: Swelling  Vital Signs: **Vital Signs.:   19-Nov-14 13:06  Vital Signs Type Routine  Temperature Temperature (F) 97.7  Celsius 36.5  Pulse Pulse 68  Respirations Respirations 20  Systolic BP Systolic BP 294  Diastolic BP (mmHg) Diastolic BP (mmHg) 87  Mean BP 117  Pulse Ox % Pulse Ox % 98  Pulse Ox Activity Level  At rest  Oxygen Delivery 2L   EXAM: GENERAL: Pleasant.  NAD.  Normocephalic and atraumatic.  EYES: Funduscopic exam shows normal disc size, appearance and C/D ratio without clear evidence of papilledema.  CARDIOVASCULAR: S1 and S2 sounds are within normal limits, without murmurs, gallops, or rubs.  MUSCULOSKELETAL: Tremors - worse with posture and when doing fine motor skills, worse in RUE compared with LUE.  No resting tremor.  No head tremor.   Bulk - Normal Tone - Normal Pronator Drift - Absent bilaterally. Ambulation - Testing deferred as patient on falls precautions.  R/L 5/5    Shoulder abduction (deltoid/supraspinatus, axillary/suprascapular n, C5) 5/5    Elbow flexion (biceps brachii, musculoskeletal n, C5-6) 5/5    Elbow extension (triceps, radial n, C7) 5/5    Finger adduction (interossei, ulnar n, T1)   5/5    Hip flexion (iliopsoas, L1/L2) 5/5    Knee flexion (hamstrings, sciatic n, L5/S1) 5/5    Knee extension (quadriceps, femoral n, L3/4) 5/5    Ankle dorsiflexion (tibialis anterior, deep fibular n, L4/5) 5/5    Ankle plantarflexion (gastroc, tibial n, S1)  NEUROLOGICAL: MENTAL  STATUS: Patient is oriented to person, place and time.  Recent and remote memory are intact.  Attention span and concentration are intact.  Naming, repetition, comprehension and expressive speech are within normal limits.  Patient's fund of knowledge is within normal limits for educational level.  CRANIAL NERVES: Normal    CN II (normal visual acuity and visual fields) Normal    CN III, IV, VI (extraocular muscles are intact) Normal    CN V (facial sensation is intact bilaterally) Normal    CN VII (facial strength is intact bilaterally) Normal    CN VIII (hearing is intact bilaterally) Normal    CN IX/X (palate elevates  midline, normal phonation) Normal    CN XI (shoulder shrug strength is normal and symmetric) Normal    CN XII (tongue protrudes midline)   SENSATION: Intact to pain and temp bilaterally (spinothalamic tracts) Intact to position and vibration bilaterally (dorsal columns)   REFLEXES: R/L 2+/2+    Biceps 2+/2+    Brachioradialis   2+/2+    Patellar 2+/2+    Achilles   COORDINATION/CEREBELLAR: Finger to nose testing is within normal limits..  Lab Results:  Hepatic:  18-Nov-14 10:35   Bilirubin, Total 0.3  Alkaline Phosphatase 75  SGPT (ALT) 28  SGOT (AST) 24  Total Protein, Serum  5.7  Albumin, Serum  3.0  Routine Chem:  18-Nov-14 10:35   Result Comment POTASSIUM - RESULTS VERIFIED BY REPEAT TESTING.  - NOTIFIED OF CRITICAL VALUE  - C/MISTY GREEN AT 1113 06/03/13-DAS  - READ-BACK PROCESS PERFORMED.  Result(s) reported on 03 Jun 2013 at 12:25PM.  B-Type Natriuretic Peptide [facility])  11011 (Result(s) reported on 03 Jun 2013 at 11:11AM.)  19-Nov-14 05:45   Ferritin ([facility]) 88 (Result(s) reported on 04 Jun 2013 at 06:27AM.)  Iron Binding Capacity (TIBC)  246  Unbound Iron Binding Capacity 183  Iron, Serum  63  Iron Saturation 26 (Result(s) reported on 04 Jun 2013 at 07:17AM.)  Phosphorus, Serum  5.5 (Result(s) reported on 04 Jun 2013 at 06:27AM.)  Uric Acid,  Serum 6.7 (Result(s) reported on 04 Jun 2013 at 06:27AM.)  Hemoglobin A1c [facility]) 5.9 (The American Diabetes Association recommends that a primary goal of therapy should be <7% and that physicians should reevaluate the treatment regimen in patients with HbA1c values consistently >8%.)  Glucose, Serum  120  BUN  81  Creatinine (comp)  6.27  Sodium, Serum 142  Potassium, Serum  5.8  Chloride, Serum  114  CO2, Serum 21  Calcium (Total), Serum  8.2  Anion Gap 7  Osmolality (calc) 309  eGFR (African American)  9  eGFR (Non-African American)  8 (eGFR values <52m/min/1.73 m2 may be an indication of chronic kidney disease (CKD). Calculated eGFR is useful in patients with stable renal function. The eGFR calculation will not be reliable in acutely ill patients when serum creatinine is changing rapidly. It is not useful in  patients on dialysis. The eGFR calculation may not be applicable to patients at the low and high extremes of body sizes, pregnant women, and vegetarians.)  Cholesterol, Serum 198  Triglycerides, Serum 87  HDL (INHOUSE) 44  VLDL Cholesterol Calculated 17  LDL Cholesterol Calculated  137 (Result(s) reported on 04 Jun 2013 at 0[facility])  Cardiac:  18-Nov-14 23:36   Troponin I 0.04 (0.00-0.05 0.05 ng/mL or less: NEGATIVE  Repeat testing in 3-6 hrs  if clinically indicated. >0.05 ng/mL: POTENTIAL  MYOCARDIAL INJURY. Repeat  testing in 3-6 hrs if  clinically indicated. NOTE: An increase or decrease  of 30% or more on serial  testing suggests a  clinically important change)  Routine UA:  18-Nov-14 10:35   Color (UA) Straw  Clarity (UA) Clear  Glucose (UA) 50 mg/dL  Bilirubin (UA) Negative  Ketones (UA) Negative  Specific Gravity (UA) 1.009  Blood (UA) 2+  pH (UA) 5.0  Protein (UA) 100 mg/dL  Nitrite (UA) Negative  Leukocyte Esterase (UA) Negative (Result(s) reported on 03 Jun 2013 at 04:46PM.)  RBC (UA) 2 /HPF  WBC (UA) 1 /HPF  Bacteria (UA) NONE SEEN   Epithelial Cells (UA) <1 /HPF  Hyaline Cast (UA) 3 /LPF (Result(s) reported on 03 Jun 2013 at 04:46PM.)  Routine Hem:  19-Nov-14 05:45   WBC (CBC)  15.6  RBC (CBC)  2.85  Hemoglobin (CBC)  8.0  Hematocrit (CBC)  24.7  Platelet Count (CBC)  120  MCV 87  MCH 27.9  MCHC 32.2  RDW  16.9  Neutrophil % 24.1  Lymphocyte % 68.4  Monocyte % 6.3  Eosinophil % 0.6  Basophil % 0.6  Neutrophil # 3.8  Lymphocyte #  10.7  Monocyte # 1.0  Eosinophil # 0.1  Basophil # 0.1 (Result(s) reported on 04 Jun 2013 at 07:20AM.)   Impression/Recommendations: Recommendations:   74 year old man who is going to be started on peritoneal dialysis for ESRD presents with lethargy and fatigue and was found to have severe hyperkalemia with a potassium level at 6.7.  Neurology asked to consult regarding tremor.    Patients tremor symptoms most consistent with essential tremor, given bilateral nature and postural/action component.  Does not appear to represent negative myoclonus as sometimes seen in patients with liver injury or elevated ammonia levels.  No resting tremor to suggest parkinsons disease.  No meications to suggest medication induced tremor.  Since already on coreg, would not use propranolol.  Would give trial to use of low dose gabapentin, renally dosed per nephrology, to see if this improves his essential tremor symptoms.  If gabapentin is ineffective, then renally dosed primidone could also be considered instead. have reviewed the results of the most recent imaging studies, tests and labs as outlined above and answered all related questions.  and coordinated plan of care with hospitalist and nephrologist.   Electronic Signatures: Anabel Bene (MD)  (Signed 20-Nov-14 01:13)  Authored: REFERRING PHYSICIAN, Primary Care Physician, Consult, History of Present Illness, Review of Systems, PAST MEDICAL/SURGICAL HISTORY, HOME MEDICATIONS, Current Medications, ALLERGIES, NURSING VITAL SIGNS, Physical  Exam-, LAB RESULTS, Recommendations   Last Updated: 20-Nov-14 01:13 by Anabel Bene (MD)

## 2014-11-06 NOTE — Discharge Summary (Signed)
PATIENT NAME:  Jonathan Mcgrath, Jonathan Mcgrath MR#:  [id number] DATE OF BIRTH:  May 20, 1941  DATE OF ADMISSION:  12/30/2012 DATE OF DISCHARGE:  01/01/2013  PRIMARY CARE PHYSICIAN: Dr. [name].   NEPHROLOGIST: Dr. Anthonette Legato.   FINAL DIAGNOSES:  1. Chronic kidney disease stage IV, hyperkalemia.  2. Elevated troponin, left shoulder and neck pain. This was not a myocardial infarction.  3. Malignant hypertension.  4. Diabetes.  5. Chronic lymphocytic leukemia.  6. Anemia.   MEDICATIONS ON DISCHARGE: Include metoprolol ER 100 mg daily, doxazosin 4 mg daily, nifedipine ER 60 mg daily, calcitriol 0.25 mcg 1 tablet daily, sodium bicarb 650 mg twice a day, acetaminophen/oxycodone 325/5 one tablet every 6 hours as needed for pain, furosemide 20 mg 3 tablets daily, clonidine 0.2 mg twice a day. Stop hydrochlorothiazide. Stop enalapril. Stop glipizide.  DIET: Low-sodium diet, carbohydrate-controlled diet, regular consistency.   FOLLOWUP: Keep appointment with Dr. Holley Raring on Thursday, 1 to 2 weeks with Dr. [name].    HOSPITAL COURSE: The patient was admitted 12/30/2012 and discharged 01/01/2013. Came in with left arm and neck pain. Was found to have a borderline troponin. Was admitted as an observation.   LABORATORY AND RADIOLOGICAL DATA: Included a CPK of 675, CK-MB 13.6, troponin 0.06. Glucose 139, BUN 48, creatinine 3.65, sodium 144, potassium 5.0, chloride 116, CO2 22, calcium 8.2. White blood cell count 20.3, H and H 9.4 and 29.0, platelet count of 118. EKG showed a normal sinus rhythm, LVH. Chest x-ray negative. Next troponin normalized at 0.05. Next creatinine 3.42 and potassium 4.9. Urinalysis 2+ blood, 50 mg/dL of glucose, greater than 500 mg/dL of protein; otherwise negative. Next troponin normal at 0.04. STRESS TEST: Low risk scan, EF 60%, left ventricular global function normal. Creatinine in the evening of June 16th was 3.19, in the morning of June 17th was 3.34 with a potassium of 5.4. In the  evening of June 17th, potassium was 5.3 with a creatinine of 3.23. In the morning of June 18th, potassium 4.7 with a creatinine of 3.28.   HOSPITAL COURSE PER PROBLEM LIST:  1. For the patient's chronic kidney disease stage IV, the patient developed hyperkalemia while here. Initially, we thought this was acute renal failure, but in speaking with Dr. Candiss Norse, covering for Dr. Holley Raring, his creatinine as outpatient has been in the 3 range, so this is chronic kidney disease stage IV. The patient kept in the hospital longer than expected secondary to hyperkalemia which required repeated doses of Kayexalate while here. The patient's enalapril was stopped. The patient's hydrochlorothiazide was stopped. This is not a good medication with chronic kidney disease. The patient's Lasix dose was increased. Close clinical followup as outpatient with Dr. Holley Raring needed in order to avoid dialysis as long as possible. The patient was advised to stay on the sodium bicarb which may help out with the hyperkalemia. May end up needing intermittent Kayexalate doses with laboratory data.  2. Elevated troponin and left shoulder and neck pain. This is not a myocardial infarction. The patient's stress test was negative. The troponins actually normalized. The patient does have neck pain. I did give him a few doses of prednisone here which improved his pain.  3. Malignant hypertension at home. Blood pressure up in the 200s. Since I stopped the enalapril and the hydrochlorothiazide, I needed to go up to 0.2 mg twice a day on the clonidine. Blood pressure still a little bit borderline here, but I am hesitant on 3 medications that could actually slow heart rate  down. Close clinical followup as outpatient with Dr. Holley Raring.  4. Diabetes: With the patient's chronic kidney disease, I am hesitant on the glipizide which can cause prolonged hypoglycemia in people with chronic kidney disease. I stopped his glipizide. His sugars have been good without  medications here, so I told him to go with the diet. If his sugars go above 200, he can take 1/2 pill of glipizide.  5. CLL: He has a chronically elevated white blood cell count.  6. Anemia: I did give him a dose of Procrit while he was here.  7. Thrombocytopenia is chronic, also.   TIME SPENT ON DISCHARGE: 35 minutes.   ____________________________ Tana Conch. Leslye Peer, MD rjw:gb D: 01/01/2013 16:29:06 ET T: 01/01/2013 22:52:35 ET JOB#: [id number]  cc: Tana Conch. Leslye Peer, MD, <Dictator> Darla Lesches, MD Mun[place] Lilian Kapur, MD  Marisue Brooklyn MD ELECTRONICALLY SIGNED 01/02/2013 13:17

## 2014-11-06 NOTE — H&P (Signed)
PATIENT NAME:  Jonathan Mcgrath, BRUCKMAN MR#:  [id number] DATE OF BIRTH:  [birth year]  DATE OF ADMISSION:  06/03/2013  PRIMARY CARE PHYSICIAN: Dr. Derenda Mis [facility])  NEPHROLOGIST: Dr. Holley Raring.   CHIEF COMPLAINT: Sleeping a lot.   HISTORY OF PRESENT ILLNESS: This is a 74 year old man who recently had a PD catheter placed to start peritoneal dialysis 3 weeks ago. He has been sleeping a lot. He has been shaky and fatigued, and his feet are swelling, and he has not noticed any weight gain. He came to the ER for further evaluation and was found to have severe hyperkalemia with a potassium of 6.7, a creatinine up at 6.3. Hospitalist services were contacted for further evaluation. No complaints of shortness of breath or chest pain.   PAST MEDICAL HISTORY: Chronic kidney disease progressing towards end-stage renal disease. Was going to start PD as outpatient, hypertension, secondary hyperparathyroidism, metabolic acidosis, history of shingles, eye problem of the retina requiring injections, anemia of chronic disease.   PAST SURGICAL HISTORY: PD catheter 3 weeks ago, eye injections as per his ophthalmologist.   ALLERGIES: LOSARTAN.   MEDICATIONS: Include calcitriol 0.25 mcg daily, Carvedilol 12.5 mg twice a day, clonidine 0.3 mg twice a day, Lasix 20 mg daily, nifedipine ER 90 mg at bedtime, Percocet 5/325 mg 1 to 2 tablets every 6 hours as needed, sodium bicarb 650 mg 2 tablets twice a day.   SOCIAL HISTORY: Is a truck Geophysicist/field seismologist. No smoking. No alcohol. No drug use. Lives with his wife.   FAMILY HISTORY: Mother 68, living, with diabetes and hypertension. Father died at 50 of COPD. He was a smoker.   REVIEW OF SYSTEMS.  CONSTITUTIONAL: No fever. No chills. Positive for cold feeling. Positive for fatigue.  EYES: He does have visual problems requiring injections for his retina.  EARS, NOSE, MOUTH, AND THROAT: Decreased hearing. Positive for runny nose with the oxygen that was given.  CARDIOVASCULAR: No chest  pain. No palpitations.  RESPIRATORY: No shortness of breath. No cough. No sputum. No hemoptysis.  GASTROINTESTINAL: No nausea. No vomiting. No abdominal pain. No diarrhea. No constipation. No bright red blood per rectum. No melena.  GENITOURINARY: No burning on urination. No hematuria.  MUSCULOSKELETAL: No joint pain.  INTEGUMENT Positive for itching where his shingles was.  NEUROLOGICAL: Feeling sleepy a lot and shakiness.  ENDOCRINE: No thyroid problems.  HEMATOLOGIC AND LYMPHATIC: Positive for anemia of chronic disease.   PHYSICAL EXAMINATION:  VITAL SIGNS: Temperature 98.1, pulse 64, respirations 22, blood pressure 126/59, pulse ox 95% on room air.  GENERAL: No respiratory distress.  EYES: Conjunctivae on the left bloodshot. Conjunctivae on the right normal. Lids normal. Pupils equal, round, and reactive to light. Extraocular muscles intact. No nystagmus. EARS, NOSE, MOUTH, AND THROAT: Tympanic membranes: No erythema. Nasal mucosa: No erythema. Throat: No erythema. No exudate seen. Lips and gums: No lesions.  NECK: No JVD. No bruits. No lymphadenopathy. No thyromegaly. No thyroid nodules palpated.  RESPIRATORY: Lungs clear to auscultation. No use of accessory muscles to breathe. No rhonchi, rales, or wheeze heard.  CARDIOVASCULAR: S1 and S2 normal. No gallops, rubs, or murmurs heard. Carotid upstroke 2+ bilaterally. No bruits.  EXTREMITIES: Dorsalis pedis pulses 2+ bilaterally and 3+ edema bilateral lower extremities.  ABDOMEN: Soft, nontender. No organosplenomegaly. Normoactive bowel sounds. No masses felt.  LYMPHATIC: No lymph nodes in the neck.  MUSCULOSKELETAL: No clubbing, 3+ edema, no cyanosis.  SKIN: No ulcers seen on area of the back of previous shingles.  NEUROLOGIC: Cranial nerves II  through XII grossly intact. Deep tendon reflexes 1+ bilateral lower extremities.  PSYCHIATRIC: The patient is oriented to person, place, and time.   LABORATORY AND RADIOLOGICAL DATA: EKG shows a  normal sinus rhythm, flipped T waves laterally. White blood cell count 13.2, H and H 7.9 and 23.7, platelet count 119. Glucose 186, BUN 83, creatinine 6.3, sodium 140, potassium 6.7, chloride 113, CO2 20, calcium 8.1. Liver function tests: Albumin low at 3. BNP 11,011.   ASSESSMENT AND PLAN: 1.  Severe hyperkalemia. Treatment with calcium chloride, Kayexalate, and sodium bicarbonate. We will also add insulin and D50 and Lasix. Recheck a potassium a little bit later. The patient will be started on peritoneal dialysis, which should handle the high potassium. We will put on telemetry monitoring. This potassium is severely elevated and could be life-threatening.  2.  Chronic kidney disease progressing to end-stage renal disease, likely with uremic symptoms. Peritoneal dialysis and teaching to be initiated here in the hospital.  3.  Edema and fluid overload. I believe this is secondary to his chronic kidney disease progressing to end-stage renal. His lungs are clear. I do not believe this is congestive heart failure, but I will give IV Lasix to get rid of the fluid in the legs and also to help out with the high potassium.  4.  Anemia of chronic disease. Nephrology can consider Procrit.  5.  Hypertension. We will continue his usual medications.  6.  Secondary hyperparathyroidism. Continue Sensipar. 7.  Thrombocytopenia. Will continue to monitor while here.  8.  Impaired fasting glucose. Will check a Hemoglobin A1c and put on sliding scale for right now.  The patient has critically high potassium.   TIME SPENT ON ADMISSION: 55 minutes.   ____________________________ Tana Conch. Leslye Peer, MD rjw:sb D: 06/03/2013 13:28:43 ET T: 06/03/2013 13:49:29 ET JOB#: [id number]  cc: Tana Conch. Leslye Peer, MD, <Dictator> Darla Lesches, MD [place] Lilian Kapur, MD  Marisue Brooklyn MD ELECTRONICALLY SIGNED 06/03/2013 18:57

## 2014-11-06 NOTE — H&P (Signed)
PATIENT NAME:  Jonathan Mcgrath, Jonathan Mcgrath MR#:  [id number] DATE OF BIRTH:  12-23-1940  DATE OF ADMISSION:  01/27/2013  PRIMARY CARE PHYSICIAN:  Dr. [name].   NEPHROLOGY:  Dr. Anthonette Legato.   CHIEF COMPLAINT:  Weakness, dizziness, nausea, vomiting.   HISTORY OF PRESENT ILLNESS:  The patient is a 74 year old male with a known history of CKD stage IV, diabetes, CLL. Is being admitted for acute on chronic kidney disease stage IV, likely due to nausea, vomiting and poor p.o. intake. The patient claims losing about 30 pounds in the last 1 month, as he is not able to eat or drink much. He feels very nauseated and vomiting the last 2 to 3 weeks, which he claims due to valacyclovir which was prescribed for his shingles on his left shoulder, although he only took 1 pill and had to be stopped. We changed to famciclovir, which he finished about a 7-day course yesterday. He went to see his kidney doctor at [place] who requested him to come to the Emergency Department, as he was feeling very dizzy and weak. While in the ED he was found to have worsening renal failure with creatinine of 4.2 weight. The last creatinine on 18th of June was 3.28. The patient also has hyperkalemia with potassium of 5.6 and is being admitted for further evaluation and management.   PAST MEDICAL HISTORY:  Chronic kidney disease stage IV with a baseline creatinine of 2.5,  Diabetes, hypertension.   PAST SURGICAL HISTORY:  None.   ALLERGIES:  LOSARTAN.   SOCIAL HISTORY:  No smoking, alcohol or illicit drug. He is married, lives with his wife. He works as a Administrator.   FAMILY HISTORY:  Brother died of massive MI.   MEDICATIONS AT HOME: 1.  Acetaminophen/oxycodone 325/5 mg, one tablet every 6 hours as needed.  2.  Calcitriol 0.25 mcg once daily.  3.  Clonidine 0.2 mg p.o. b.i.d.  4.  Doxazosin 4 mg p.o. daily.  5.  Lasix 20 mg 2 tablets p.o. daily.  6.  Metoprolol 100 mg p.o. daily.  7.  Nifedipine 60 mg p.o.  daily.  8.  Sodium bicarbonate 650 mg 2 tablets p.o. b.i.d.  9.  Vitamin D2 50,000 International Units p.o. once a week.   REVIEW OF SYSTEM: CONSTITUTIONAL:  No fever. Positive for fatigue and weakness. Also, positive for weight loss of about 30 pounds in the last 1 month.  EYES:  No blurry or double vision.  ENT:  No tinnitus or ear pain.  RESPIRATORY:  No cough, wheezing, hemoptysis.  CARDIOVASCULAR:  No chest pain, orthopnea, edema.  GASTROINTESTINAL:  Positive for nausea and vomiting. No diarrhea. Has very poor p.o. intake.  GENITOURINARY:  Has no dysuria, hematuria. He is a predialysis stage, not yet dialysis patient. He is following with a kidney doctor for predialysis workup.  ENDOCRINE:  No polyuria or nocturia.  HEMATOLOGY:  Anemia of chronic disease present with stable hemodynamics. No easy bruising or bleeding.  SKIN:  No rash or lesion. He does have healing lesions of shingles on his left shoulder.  MUSCULOSKELETAL:  No arthritis or muscle cramp.  NEUROLOGICALLY:  No tingling, numbness, weakness.  PSYCHIATRIC:  No history of anxiety or depression.   PHYSICAL EXAMINATION: VITAL SIGNS:  Temperature 98.2, heart rate 65 per minute, respiration 18 per minute, blood pressure 121/63 mmHg. He is saturating 96% on room air.  GENERAL:  The patient is a 74 year old male lying in the bed comfortably without any acute  distress.  EYES:  Pupils equal, round, reactive to light and accommodation. No scleral icterus. Extraocular muscles intact.  HEENT:  Head atraumatic, normocephalic. Oropharynx and nasopharynx clear.  NECK:  Supple. No jugular venous distention. No thyroid enlargement or tenderness.  LUNGS:  Clear to auscultation bilaterally. No wheezing, rales or crepitation.  CARDIOVASCULAR:  S1, S2 normal. No murmurs.  ABDOMEN:  Soft, nontender, nondistended. Bowel sounds present. No organomegaly, masses.  EXTREMITIES:  No pretibial edema, cyanosis or clubbing.  SKIN:  He has a healing  black pigmented lesions on his left shoulder from previous shingle.   MUSCULOSKELETAL:  Good range of motion of all extremities. No joint effusion.  NEUROLOGIC:  Cranial nerve II through XII intact.  Muscle strength 5/5 in all extremities. Sensation intact.  PSYCHIATRIC:  The patient is alert and oriented x 3.   LABORATORY PANEL:  Sodium 139, potassium 5.6, chloride 109, CO2 of 27, BUN 48, creatinine 4.28, blood sugar 205. Lipase of 362. Normal liver function tests.   CK of 307. MB fraction 7.3. Troponin 0.04. CBC showed white count of 22.3, hemoglobin 10.4, hematocrit 32.8, platelet 120.   EKG shows normal sinus rhythm, LVH. No major ST-T changes.   IMPRESSION AND PLAN: 1.  Acute on chronic kidney disease, stage IV, with a baseline creatinine of 2.5 to 2.7. Now, creatinine is 3.2. Will consult nephrology. This is likely prerenal, avoiding any nephrotoxin. Hold off Lasix. Monitor his kidney function.  2.  Hyperkalemia, likely due to renal failure: He does not have any EKG changes from hyperkalemia. Will monitor his potassium. Consider Kayexalate if not better.  3.  Diabetes mellitus. Not on any medication. Will put him on sliding scale insulin. Check hemoglobin A1c.  4.  Nausea, vomiting and weakness with some 30-pound weight loss. Will monitor daily weights. We will hold off any narcotics. This could be viral illness also.               5.  CODE STATUS:  Full code.   Total time taking care of this patient is 55 minutes.   ____________________________ Lucina Mellow. Manuella Ghazi, MD vss:kc D: 01/27/2013 16:27:50 ET T: 01/27/2013 17:21:17 ET JOB#: [id number]  cc: Markeeta Scalf S. Manuella Ghazi, MD, <Dictator> Darla Lesches, MD [place] Lilian Kapur, MD  Lucina Mellow [facility] MD ELECTRONICALLY SIGNED 01/30/2013 16:07

## 2014-11-06 NOTE — Discharge Summary (Signed)
PATIENT NAME:  Jonathan Mcgrath, Jonathan Mcgrath MR#:  [id number] DATE OF BIRTH:  11-26-1940  DATE OF ADMISSION:  06/03/2013 DATE OF DISCHARGE:  06/05/2013  PRIMARY CARE PHYSICIAN: [name], MD, in [place].   NEPHROLOGIST: Dr. Holley Raring.   FINAL DIAGNOSES:  1.  Severe hyperkalemia.  2.  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 Started on peritoneal dialysis.  3.  Fluid overload and edema.  4.  Accelerated hypertension.  5.  Essential tremor.  6.  Vitamin D deficiency.  7.  Anemia of chronic disease.   MEDICATIONS ON DISCHARGE: Include calcitriol 0.25 mcg one capsule daily, clonidine 0.3 mg twice a day, gabapentin 100 mg at bedtime, Lasix 40 mg daily, vitamin D 50,000 units once a week, labetalol 200 mg twice a day.   DIET: Renal diet.   ACTIVITY: As tolerated.   REFERRAL: Peritoneal dialysis training daily at [facility] [place]. Follow-up in 1 to 2 weeks with Dr. [name].   HOSPITAL COURSE: The patient was admitted June 03, 2013, discharged on June 05, 2013. Came in with sleeping a lot; was admitted with severe hyperkalemia; was treated with calcium chloride, Kayexalate, sodium bicarb, insulin, D50, and Lasix; and started on peritoneal dialysis for his chronic kidney disease, progressed to end-stage renal disease with uremic symptoms. The patient was started on peritoneal dialysis for his edema and fluid overload, likely secondary to chronic kidney disease.   His lungs were clear.   For his anemia of chronic disease, he was given Procrit.   For his hypertension, initially continued on his usual medications.   For his secondary hyperparathyroidism, he was continued on Sensipar.   For his thrombocytopenia, we just monitored.   LABORATORY AND RADIOLOGICAL DATA DURING THE HOSPITAL COURSE: Urinalysis negative. BNP 11,011. Glucose 186, BUN 83, creatinine 6.3, sodium 140, potassium 6.7, chloride 113, CO2 20, calcium 8.1. Liver function tests normal range.  White blood cell count 13.2, H and H 7.9 and 23.7, platelet count 119.   EKG: Sinus, arrhythmia, first degree AV block,   CHEST X-RAY: Enlargement of the cardiac silhouette with pulmonary vascular congestion.   Troponin negative x3. Transferrin saturation 197. Vitamin D 16.4. Uric acid 6.7. Phosphorus 5.5. Parathyroid hormone intact 279. Ferritin 88. Hemoglobin A1c 5.9. LDL 137. Creatinine upon discharge 6.44, potassium upon discharge 5.1. Hemoglobin upon discharge 8.0.   HOSPITAL COURSE PER PROBLEM LIST:  1.  For the patient's severe hyperkalemia, this was treated in the Emergency Room by ER physician and me, started on peritoneal dialysis by Dr. Candiss Norse, nephrology. Potassium upon discharge is improved to 5.1.  2.  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 The patient started on peritoneal dialysis here in the hospital. Will continue with peritoneal dialysis on a daily basis. Teaching at the [place] dialysis center.  3.  For the patient's fluid overload and edema, he was started on IV Lasix here in the hospital and switched to Lasix orally upon discharge, 40 mg. His Adalat CC was stopped.  4.  For his malignant hypertension, the patient was started on hydralazine and he vomited up that pill right before discharge, so I did give him an extra dose of clonidine and I switched his Coreg over to labetalol. Blood pressure upon discharge 160/90. Continue to follow up as outpatient for hypertension and titration of meds.  5.  For essential tremor, neurology recommended. Neurontin low dose started.  6.  Vitamin D deficiency. Oral supplementation weekly.  7.  Anemia of chronic disease. Dr. Candiss Norse  gave a dose of Procrit in the hospital.  TIME SPENT ON DISCHARGE: 35 minutes.    ____________________________ Tana Conch. Leslye Peer, MD rjw:np D: 06/05/2013 17:27:21 ET T: 06/05/2013 21:39:25 ET JOB#: [id number]  cc: Tana Conch. Leslye Peer, MD, <Dictator> Darla Lesches, MD [place] Lilian Kapur,  MD [place] MD ELECTRONICALLY SIGNED 06/10/2013 14:15

## 2014-11-06 NOTE — H&P (Signed)
PATIENT NAME:  Jonathan Mcgrath, Jonathan Mcgrath MR#:  [id number] DATE OF BIRTH:  [birth year]  DATE OF ADMISSION:  12/30/2012  PRIMARY CARE PHYSICIAN: [name].   REFERRING PHYSICIAN: Dr. Lenise Arena.   CHIEF COMPLAINT: Left arm and neck pain.   HISTORY OF PRESENT ILLNESS: Jonathan Mcgrath is a 74 year old, pleasant, African American male with a history of hypertension, diabetes mellitus, chronic kidney disease. Presented to the Emergency Department with complaint of 2 days of neck, left shoulder pain. This is sharp in nature. No exacerbating or relieving factors. This is associated with diaphoresis and nausea. Has been having significant decreased appetite for the last 2 days. Concerning this, came to the Emergency Department. On workup in the Emergency Department, the patient was found to have elevated CK and CK-MB with mild elevation of the troponin of 0.09. The patient's baseline creatinine is about 2.5; however, it was significantly worsened to 3.7. The patient follows up with Dr. Holley Raring for the kidney function. The patient is well functional at baseline. The patient was also found to have anemia with a hemoglobin of 9.6.   PAST MEDICAL HISTORY:  1. Hypertension.  2. Diabetes mellitus.  3. Chronic kidney disease.   PAST SURGICAL HISTORY: None.   ALLERGIES: LOSARTAN.   HOME MEDICATIONS:  1. Metoprolol succinate 100 mg once a day.  2. Hydrochlorothiazide 25 mg once a day.  3. Glipizide 5 mg once a day.  4.  0.1 mg 2 tablets at bedtime.   SOCIAL HISTORY: No history of smoking, drinking alcohol or using illicit drugs. Married, lives with his wife. The patient works as a Administrator.   FAMILY HISTORY: Brother just died of massive MI about 1 week back.   REVIEW OF SYSTEMS:   CONSTITUTIONAL: Generalized weakness.  EYES: No change in vision.  EARS: No change in hearing. No tinnitus. No sore throat.  CHEST: Has no focal tenderness.  CARDIOVASCULAR: Shortness of breath. Concerned about chest  pain.  GASTROINTESTINAL: Has nausea. No diarrhea or vomiting.  GENITOURINARY: No dysuria or hematuria.   SKIN: No rash or lesions.  ENDOCRINE: Has diagnosis of diabetes mellitus.  NEUROLOGIC: The patient does not have any weakness or numbness in any part of the body.   PHYSICAL EXAMINATION:  GENERAL: This is a well-built, well-nourished, age-appropriate male lying down in the bed, not in distress.  VITAL SIGNS: Temperature 98, pulse 71, blood pressure 145/72, respiratory rate of 20, oxygen saturation is 95% on room air.  HEENT: Head normocephalic, atraumatic. There is no sclerae icterus. Conjunctivae normal. Pupils equal and react to light. Mucous membranes moist.  NECK: Supple. No lymphadenopathy. No JVD. No carotid bruit. No thyromegaly.  CHEST: Has palpable tenderness on the left shoulder and left side of the neck. Lungs bilaterally clear to auscultation.  HEART: S1 and S2 regular. No murmurs are heard. No pedal edema. Pulses 2+.  ABDOMEN: Bowel sounds present. Soft, nontender, nondistended. No hepatosplenomegaly  SKIN: No rash or lesions.   MUSCULOSKELETAL: Good range of motion in all of the extremities.  NEUROLOGIC: The patient is alert, oriented to place, person and time. Cranial nerves II through XII intact. No motor and sensory deficits.   LABS: CBC: WBC of 20.3, hemoglobin 9.4, platelet count of 118. CMP: BUN 48, creatinine of 3.65, potassium 5, bicarb 22. The rest of the values are within normal limits. Troponin . CK 675. CK-MB 13.6.   EKG, 12-LEAD: Normal sinus rhythm. No ST-T wave abnormalities. Occasional PVCs.   ASSESSMENT AND PLAN: Jonathan Mcgrath is a 74 year old  male who comes to the Emergency Department with 2 days of left arm pain.  1. Left arm pain: Concern about the patient's associated symptoms of diaphoresis, nausea, wobbliness. Admit the patient to a monitored bed. Continue to cycle cardiac enzymes x3. If negative, the patient could be discharged home. Will also keep  the patient on aspirin and lipid profile.  2. Chronic kidney disease: Significant worsening from the baseline. The patient is on hydrochlorothiazide. Will hold it. Continue with gentle hydration and follow up.  3. Elevated troponin: This is secondary to elevated CK and CK-MB.  4. Diabetes mellitus: Will hold the glipizide. Continue sliding scale insulin.  5. Give the patient deep vein thrombosis prophylaxis with heparin.   TIME SPENT: 40 minutes.    ____________________________ Monica Becton, MD pv:gb D: 12/29/2012 23:43:16 ET T: 12/30/2012 00:56:06 ET JOB#: [id number]  cc: Monica Becton, MD, <Dictator> Darla Lesches, MD Monica Becton MD ELECTRONICALLY SIGNED 12/31/2012 0:50

## 2014-11-06 NOTE — Discharge Summary (Signed)
PATIENT NAME:  Jonathan Mcgrath, Jonathan Mcgrath MR#:  [id number] DATE OF BIRTH:  Mar 26, 1941  DATE OF ADMISSION:  01/27/2013 DATE OF DISCHARGE:  01/29/2013  DISCHARGE DIAGNOSES: 1.  Acute on chronic kidney disease, stage IV at baseline.  2.  Hyperkalemia, now resolved.  3.  Hypotension, likely due to dehydration, now resolved with IV fluids.  4.  Nausea and vomiting to the medication, now resolved.  5.  Right lower lobe atelectasis.  6.  Shingles pain started on leg, improving.   SECONDARY DIAGNOSES: 1.  Chronic kidney disease stage IV.  2.  Diabetes.  3.  Hypertension.   CONSULTANTS: Nephrology, Dr. Candiss Norse.   LABORATORY, DIAGNOSTIC AND RADIOLOGIC DATA: Chest x-ray on July 15, showed elevated left side hemidiaphragm with patchy density, concerning for an infiltrate or atelectasis.   Chest x-ray on July 16, showed persistent atelectasis in the left lower hemithorax.   Bilateral  kidney ultrasound on July 15, showed simple left renal cyst in the upper pole. Posterior enlargement. No obstructive uropathy.   Major laboratory panel: Urinalysis on admission showed trace bacteria and 2 WBCs, otherwise negative.   HISTORY AND SHORT HOSPITAL COURSE: The patient is a 74 year old male with above-mentioned medical problems, who was admitted for acute on chronic kidney disease stage IV with hyperkalemia. Nephrology consultation was obtained with Dr. Candiss Norse, who recommended hydration and monitoring blood pressure as the patient was found to be hypotensive at nephrology office. The patient's potassium improved and hypotension also resolved. He was feeling much better and was discharged home in stable condition on July 16. On the date of discharge, his vital signs are as follows: Temperature 98.1, heart rate 68 per minute, respirations 20 per minute, blood pressure 162/82 and he was saturating 92% on room air.   PERTINENT PHYSICAL EXAMINATION ON THE DATE OF DISCHARGE:  CARDIOVASCULAR: S1, S2 normal. No murmur, rubs or  gallops.  LUNGS: Clear to auscultation bilaterally. No wheezing, rales, rhonchi, or crepitation.  ABDOMEN: Soft, benign.  NEUROLOGIC: Nonfocal examination.  All other physical examination remained at baseline.   DISCHARGE MEDICATIONS: 1.  Metoprolol 100 mg p.o. daily.  2.  Nifedipine 60 mg p.o. daily.  3.  Calcitrol 0.25 mcg p.o. daily.  4.  Sodium bicarbonate 650 mg 2 tablets p.o. b.i.d.  5.  Vitamin D2 50,000 international units once a week.  6.  Clonidine 0.2 mg p.o. b.i.d.  7.  Lyrica 75 mg p.o. b.i.d.  8.  Protonix 40 mg p.o. daily.   DISCHARGE DIET: Low sodium, low fat, low cholesterol, 1800 ADA.   DISCHARGE ACTIVITY: As tolerated.   Discharge instructions and follow-up: The patient was instructed to follow up with his primary care physician, Dr. [name] in 2 to 3 days. He will need follow-up with Dr. Anthonette Legato  in 1 to 2 weeks.   TOTAL TIME DISCHARGING THIS PATIENT: 56.    and decided to come to [place] [facility] he has Senokot.   M.D. [facility]   ____________________________ Lucina Mellow. Manuella Ghazi, MD vss:cc D: 01/29/2013 16:09:26 ET T: 01/29/2013 17:39:10 ET JOB#: [id number]  cc: Latoshia Monrroy S. Manuella Ghazi, MD, <Dictator> Darla Lesches, MD [place] Lilian Kapur, MD Lucina Mellow [facility] MD ELECTRONICALLY SIGNED 01/30/2013 16:21

## 2014-11-06 NOTE — Op Note (Signed)
PATIENT NAME:  Jonathan Mcgrath, Jonathan Mcgrath MR#:  [id number] DATE OF BIRTH:  1941-04-20  DATE OF PROCEDURE:  05/14/2013  PREOPERATIVE DIAGNOSES: Stage IV renal insufficiency.   POSTOPERATIVE DIAGNOSIS:  Stage IV renal insufficiency.  PROCEDURE PERFORMED:  Laparoscopic-assisted insertion of a peritoneal dialysis catheter.   SURGEON:  Dr. Delana Meyer.  ANESTHESIA: General by endotracheal intubation.   FLUIDS: Per anesthesia record.   ESTIMATED BLOOD LOSS: Minimal.   SPECIMEN: None.   INDICATIONS: Mr. Guzzetta is a 74 year old gentleman who will be requiring hemodialysis and has elected to proceed with peritoneal dialysis. The risks and benefits for catheter insertion were reviewed. All questions answered. The patient has agreed to proceed.   DESCRIPTION OF PROCEDURE: The patient is taken to the operating room, placed in supine position. After adequate general anesthesia is induced and appropriate invasive monitors are placed, he is positioned supine, and his abdomen is prepped and draped in a sterile fashion. Appropriate timeout is called.   Vertical incision is made supraumbilically in the midline, carried down to the fascia, which is grasped with a bone hook, elevated, and a Veress needle is introduced without difficulty. Saline test verifies peritoneal placement, and low flow insufflation is performed. High flow insufflation is then performed to a total abdominal pressure of 15 mmHg.  The viscera is then inspected. No significant abnormalities are identified. No scarring is identified. Visualized portions all appear normal. Area in the left lower quadrant is selected, and a small incision is created. Dissection is carried down to expose the fascia. A Veress needle is introduced under direct laparoscopic visualization, and the wire, followed by the dilator and peel-away sheath, is inserted. Peritoneal catheter is then advanced under direct visualization and positioned deeply within the pelvis on the left  side. The cuff is then advanced deep to the anterior sheath and is secured with a pursestring 0 Vicryl suture. The catheter is then pulled subcutaneously and exiting out an incision 2 fingerbreadths below the costal margin; 300 mL of saline is infused easily and the return of over 200 is noted. The lower quadrant incision is then closed in several layers with running 3-0 Vicryl, followed by 4-0 Monocryl subcuticular. The midline incision is closed with a figure-of-eight 0 Vicryl for fascia, followed by several layers of 3-0 Vicryl, followed by 4-0 Monocryl subcuticular. Dermabond is applied. Sterile dressing is applied to the exit site.   The patient tolerated the procedure well. There were no immediate complications. Sponge and needle counts were correct, and he was taken to the recovery area in excellent condition.     ____________________________ Katha Cabal, MD ggs:dmm D: 05/14/2013 13:06:44 ET T: 05/14/2013 13:23:05 ET JOB#: [id number]  cc: Katha Cabal, MD, <Dictator> Katha Cabal MD ELECTRONICALLY SIGNED 05/23/2013 8:15

## 2014-11-07 NOTE — Discharge Summary (Signed)
PATIENT NAME:  Jonathan Mcgrath, PALLEY MR#:  [id number] DATE OF BIRTH:  May 14, 1941  DATE OF ADMISSION:  04/11/2014 DATE OF DISCHARGE:  04/13/2014  PRIMARY CARE PHYSICIAN: Dr. Derenda Mis.   PRIMARY NEPHROLOGIST: Dr. Holley Raring.   CHIEF COMPLAINT AT THE TIME OF ADMISSION: Nausea and vomiting.   ADMITTING DIAGNOSES:  1. Acute gastroenteritis, probably viral.  2. Hyperkalemia.  3. End-stage renal disease.   DISCHARGE DIAGNOSES:  1. Acute gastroenteritis, probably viral gastroenteritis versus food poisoning from salmon.  2. Hyperkalemia resolved.  3. End-stage renal disease. Continue peritoneal dialysis.  4. 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 The patient is asymptomatic. 5. Hypertension, elevated.   CONSULTATIONS: Nephrology.   PROCEDURES: Peritoneal dialysis was continued.   BRIEF HISTORY OF PRESENT ILLNESS AND HOSPITAL COURSE: The patient is a 74 year old African American male, who came into the ED with a chief complaint of nausea and vomiting. The patient was having a 24 hour history of nausea, vomiting and diarrhea. Please review the history and physical for details. The patient was admitted to the hospital with a chief complaint of acute gastroenteritis.    For acute gastroenteritis, the patient was initially made n.p.o. A peritoneal dialysis was held the day before because of his acute gastroenteritis. The patient admitted that he ate some salmon which could have caused acute gastroenteritis. Stool for Clostridium difficile toxin was negative. The patient was given gentle hydration. Clear liquid diet was started, and slowly it was advanced as the patient was feeling better with gentle hydration with IV Zofran. Stool tests were negative and his symptoms have subsequently resolved in no time. The patient started feeling better.   End-stage renal disease. Peritoneal dialysis was resumed yesterday night.   Hyperkalemia. One dose of Kayexalate was given. Gentle hydration was  given, and after peritoneal dialysis last night, his potassium trended down to 4.6.   Elevated troponin with no significant trend. This elevated troponin was deemed to be from uncontrolled hypertension. The patient was asymptomatic with no chest pain or shortness of breath. Aspirin and statin were continued. The plan is to continue Lipitor 10 mg p.o. once daily at bedtime.   Hypertension, elevated. The plan is to continue labetalol and his home medication, losartan, was resumed and the dose is increased to 100 mg p.o. at bedtime. Further management in titrating medications by nephrology.   Neurontin 300 mg p.o. at bedtime is added for neuropathy.   OVERALL CONDITION: Stable.   DISCHARGE INSTRUCTIONS: We will discharge the patient home. Continue peritoneal dialysis as recommended by nephrology.   FOLLOW-UP APPOINTMENTS: With primary care physician in a week and nephrology in a week.   ACTIVITY: As tolerated.   DIET: Renal diet.   LABORATORY AND IMAGING STUDIES: Glucose 159, BUN 39, creatinine 9.83. Sodium, potassium are normal.   Troponin 0.17, 0.15, 0.16.   White count is trending down, initially 18.6, subsequently 17.6. The patient is clinically afebrile.   Stool for Clostridium difficile toxin is negative.   DISCHARGE MEDICATIONS: 1. Calcitriol 0.25 mcg p.o. once daily. 2. Labetalol 200 mg p.o. b.i.d. 3. Furosemide 40 mg p.o. once a day in a.m.  4. Clonidine 0.3 mg p.o. 2 times a day. 5. Clonazepam 0.5 mg half-tablet 2 times a day as needed.  6. Norco 325/5 one to two tablets p.o. every 6 hours as needed for pain. 7. Gabapentin 300 mg p.o. at bedtime. 8. Aspirin 325 mg once daily. 9. Lipitor 10 mg p.o. at bedtime. 10. Losartan 100 mg p.o. once daily at bedtime.  The plan of care was discussed in detail with the patient and his wife at bedside. They both verbalized understanding of the plan.   TOTAL TIME SPENT: 45 minutes.    ____________________________ Nicholes Mango,  MD ag:JT D: 04/13/2014 13:16:29 ET T: 04/13/2014 14:55:13 ET JOB#: [id number]  cc: Nicholes Mango, MD, <Dictator> Darla Lesches, MD [place] Lilian Kapur, MD Nicholes Mango MD ELECTRONICALLY SIGNED 04/17/2014 11:17

## 2014-11-07 NOTE — H&P (Signed)
PATIENT NAME:  Jonathan Mcgrath, Jonathan Mcgrath MR#:  [id number] DATE OF BIRTH:  1940/08/30  DATE OF ADMISSION:  04/11/2014  EMERGENCY ROOM PHYSICIAN:  Conni Slipper, MD.  PRIMARY CARE PHYSICIAN: Darla Lesches, MD.  PRIMARY NEPHROLOGIST:  Dr. Candiss Norse.   CHIEF COMPLAINT: Nausea and vomiting.   HISTORY OF PRESENT ILLNESS: This very pleasant 74 year old man with end-stage renal disease and hypertension presents today with greater than 24 hours of nausea, vomiting and diarrhea. He reports that he has lost count of how many episodes he has had. He has not been able to keep anything down for 24 hours. He skipped peritoneal dialysis last night due to these symptoms. He did not take his antihypertensive medications this morning due to these symptoms. He denies any fevers or chills. He cannot identify any foods that may have triggered his symptoms. He has had no hematemesis or hematochezia. He has had no abdominal pain or chest pain. No shortness of breath. He had a brief cough prior to starting vomiting.   PAST MEDICAL HISTORY:  1.  End-stage renal disease on peritoneal dialysis, follows with Dr. Candiss Norse.  2.  Hypertension  3.  Secondary hyperparathyroidism.  4.  Anemia of chronic disease.   PAST SURGICAL HISTORY:  1.  Peritoneal dialysis catheter placement.  2.  Eye injections.  3.  Creation of left arm brachiocephalic fistula January [id number].   ALLERGIES:  LOSARTAN.   MEDICATIONS:   1.  Norco 325/5 mg oral tablet 1-2 tablets by mouth every 6 hours as needed for pain.  2.  Labetalol 200 mg 1 tablet twice a day.  3.  Gabapentin 100 mg 1 capsule orally once a day at bedtime.  4.  Furosemide 40 mg 1 tablet once a day in the morning.  5.  Clonidine 0.3 mg 1 tablet twice a day.  6.  Clonazepam 0.5 mg 0.5-1 tablet 2 times a day as needed for anxiety.  7.  Calcitriol 0.25 mg 1 tablet once a day in the morning.   FAMILY HISTORY: Mother with diabetes and hypertension. Father had COPD, was a smoker.   SOCIAL HISTORY:  The patient is a Manufacturing systems engineer. He does not smoke cigarettes or drink alcohol. He lives with his wife. He does not use any home equipment for ambulation. He is on peritoneal dialysis.   REVIEW OF SYSTEMS: GENERAL: Positive for weakness and fatigue. Negative for weight change, fevers or chills.  HEENT: No pain in the eyes, ears. No change in vision or hearing. No pain in the mouth, no difficulty swallowing.  PULMONARY: No shortness of breath, wheezing, cough, sputum, hemoptysis.  CARDIOVASCULAR: No chest pain, palpitations, orthopnea, syncope.  ABDOMEN: Positive as noted above for nausea, vomiting, diarrhea. No abdominal pain, no hematochezia, melena, hematemesis.  MUSCULOSKELETAL: No tender or swollen joints. No change in range of motion. No weakness.  NEUROLOGIC: No focal numbness or weakness. No confusion, no syncope, seizure, headache.  GENITOURINARY: No frequency or dysuria, the patient reports that he does make a significant amount of urine daily.  PSYCHIATRIC: No uncontrolled anxiety or depression.   PHYSICAL EXAMINATION:  VITAL SIGNS: Temperature 98.1, pulse 81, respirations 18, blood pressure 181/81, oxygenation 97% on room air.  GENERAL: The patient is uncomfortable, very still in the exam bed.  HEENT: Pupils are equal, round, and reactive to light. Conjunctivae are clear with no icterus, no injection. Extraocular motion is intact. Oral mucous membranes are pink and dry. Oropharynx is clear with no lesions, no exudate, no edema. Trachea is  midline. There is no cervical lymphadenopathy. Thyroid is nontender.  PULMONARY: Lungs are clear to auscultation bilaterally with good air movement.  CARDIOVASCULAR: Regular rate and rhythm, no murmurs, rubs, or gallops, no peripheral edema. Peripheral pulses are 1+. ABDOMEN: Bowel sounds are increased. Abdomen is distended, there is no abdominal tenderness, no hepatosplenomegaly. No guarding, no rebound, no mass.  SKIN: No rash, no wounds  noted.  MUSCULOSKELETAL: No tender or swollen joints. No effusions. Range of motion is normal, strength is 5 out of 5 throughout.  NEUROLOGIC: Cranial nerves II-through II-XII are grossly intact. Sensation and strength are intact, nonfocal neurologic examination.  PSYCHIATRIC: The patient is calm, alert and oriented x 4. Good insight. No signs of uncontrolled depression or anxiety.   LABORATORY DATA: Troponin is 0.17, white blood cell count 18.6, hemoglobin 12.1, platelets 149,000, MCV is 94, sodium 141, potassium 5.1, chloride 109, CO2 24, BUN 79, creatinine 10.4, glucose 113, LFTs are normal, serum albumin is low at 3.2, lipase is 173.  IMAGING: No imaging.    ASSESSMENT AND PLAN:  1.  Nausea and vomiting with leukocytosis: Likely viral gastroenteritis. We will start with Zofran and gentle hydration. We will check stool cultures for Clostridium difficile and general culture.  Clear diet advance as tolerated.  3.  End-stage renal disease on peritoneal dialysis. I have discussed the patient with Dr. Holley Raring tonight. We will hold peritoneal dialysis again due to ongoing fluid loss.  4.  Hyperkalemia is mild at this point. We will monitor the patient on telemetry and recheck in the morning. Gentle hydration will help.  5.  Elevated troponin: This rise is likely due to strain of uncontrolled hypertension in the setting of end-stage renal disease and dehydration. The patient has had no chest pain. We will start aspirin, statin and will have nitroglycerin available p.r.n. If cardiac enzymes increase, EKG change or chest pain develops would consult cardiology.  6.  Leukocytosis: Most likely due to problem #1.   7.  Prophylaxis:  Heparin.   Time spent on admission: 45 minutes.    ____________________________ Earleen Newport. Volanda Napoleon, MD cpw:lt D: 04/11/2014 22:15:03 ET T: 04/11/2014 23:39:22 ET JOB#: [id number]  cc: Earleen Newport. Volanda Napoleon, MD, <Dictator> Aldean Jewett MD ELECTRONICALLY SIGNED 04/12/2014  15:11

## 2014-11-07 NOTE — Op Note (Signed)
PATIENT NAME:  MONTERRIO, GAUGH MR#:  [id number] DATE OF BIRTH:  1941/06/16  DATE OF PROCEDURE:  07/25/2013  PREOPERATIVE DIAGNOSES:  1.  End-stage renal disease requiring hemodialysis.  2.  Hypertension.    POSTOPERATIVE DIAGNOSIS:  1.  End-stage renal disease requiring hemodialysis.  2.  Hypertension.   PROCEDURE PERFORMED: Creation of a left arm brachiocephalic fistula.   SURGEON: Hortencia Pilar, MD.   ANESTHESIA: General by LMA.   FLUIDS: Per anesthesia record.   ESTIMATED BLOOD LOSS: 25 mL.   SPECIMEN: None.   INDICATIONS: Mr. Kuligowski is a 74 year old gentleman who is transitioning from peritoneal dialysis to hemodialysis. He, therefore, requires upper extremity access. Vein mapping as well as arterial studies have demonstrated adequate potential in the left arm. He is right hand dominant and therefore has elected to proceed with creation of a fistula. The risks and benefits were reviewed. All questions answered. The patient agrees to proceed.   DESCRIPTION OF PROCEDURE: The patient is taken to the operating room and placed in the supine position. After adequate general anesthesia is induced and appropriate invasive monitors are placed, he is positioned supine with his left arm extended palm upward. The left arm is prepped and draped in a sterile fashion. Appropriate timeout is called.   The cephalic vein is easily visible after induction of anesthesia, and this is marked on the skin with a surgical marker. Brachial impulse is noted and a curvilinear incision is created across the antecubital fossa. Dissection is carried down and the brachial artery is identified. It is looped proximally and distally with Silastic vessel loops. The vein is then exposed through the same incision and dissected circumferentially proximally and distally. Several tributaries are ligated with 3-0 silk ties. The vein is then marked with a surgical marker.   The vein is transected distally after  ligating with a 2-0 silk tie. It is then dilated and irrigated with a Marx tip using heparinized saline and a bulldog is applied proximally. It is approximated to the artery and found to be adequate length.   The artery is then controlled proximally and distally with Silastic vessel loops. Arteriotomy is made and an 11 blade extended with Potts scissors. Stay suture of 6-0 Prolene is placed and subsequently an end vein to side brachial artery anastomosis is fashioned using running 6-0 Prolene. Flushing maneuvers are performed and flow was established through the fistula and then to the hand. A 2+ radial pulse is noted. Excellent thrill is noted with good dilation of the vein.   The wound is irrigated and then closed in layers using interrupted 3-0 Vicryl, followed by 4-0 Monocryl subcuticular and Dermabond. The patient tolerated the procedure well and there were no immediate complications. Sponge and needle counts are correct and he was taken to the recovery area in excellent condition.   ____________________________ Katha Cabal, MD ggs:dp D: 07/25/2013 09:07:31 ET T: 07/25/2013 09:49:04 ET JOB#: [id number]  cc: Katha Cabal, MD, <Dictator> Darla Lesches, MD [place] MD ELECTRONICALLY SIGNED 07/31/2013 9:47

## 2014-12-17 ENCOUNTER — Telehealth: Payer: Self-pay | Admitting: *Deleted

## 2014-12-17 NOTE — Telephone Encounter (Signed)
Will not interfere with CLL Jonathan Mcgrath informed

## 2015-01-07 ENCOUNTER — Inpatient Hospital Stay: Admission: RE | Admit: 2015-01-07 | Payer: Self-pay | Source: Ambulatory Visit

## 2015-01-12 ENCOUNTER — Encounter
Admission: RE | Admit: 2015-01-12 | Discharge: 2015-01-12 | Disposition: A | Discharge status: Home / Self Care | Payer: Medicare HMO | Source: Ambulatory Visit | Attending: Vascular Surgery | Admitting: Vascular Surgery

## 2015-01-12 DIAGNOSIS — I120 Hypertensive chronic kidney disease with stage 5 chronic kidney disease or end stage renal disease: Secondary | ICD-10-CM | POA: Diagnosis not present

## 2015-01-12 DIAGNOSIS — N186 End stage renal disease: Secondary | ICD-10-CM | POA: Diagnosis not present

## 2015-01-12 DIAGNOSIS — Z992 Dependence on renal dialysis: Secondary | ICD-10-CM | POA: Diagnosis not present

## 2015-01-12 DIAGNOSIS — Z4902 Encounter for fitting and adjustment of peritoneal dialysis catheter: Secondary | ICD-10-CM | POA: Diagnosis not present

## 2015-01-12 DIAGNOSIS — Z87891 Personal history of nicotine dependence: Secondary | ICD-10-CM | POA: Diagnosis not present

## 2015-01-12 DIAGNOSIS — Z79899 Other long term (current) drug therapy: Secondary | ICD-10-CM | POA: Diagnosis not present

## 2015-01-12 DIAGNOSIS — E1122 Type 2 diabetes mellitus with diabetic chronic kidney disease: Secondary | ICD-10-CM | POA: Diagnosis not present

## 2015-01-12 HISTORY — DX: Essential (primary) hypertension: I10

## 2015-01-12 HISTORY — DX: Chronic kidney disease, unspecified: N18.9

## 2015-01-12 LAB — PROTIME-INR
INR: 1.13
Prothrombin Time: 14.7 seconds (ref 11.4–15.0)

## 2015-01-12 LAB — BASIC METABOLIC PANEL
Anion gap: 10 (ref 5–15)
BUN: 40 mg/dL — ABNORMAL HIGH (ref 6–20)
CO2: 28 mmol/L (ref 22–32)
Calcium: 7.5 mg/dL — ABNORMAL LOW (ref 8.9–10.3)
Chloride: 100 mmol/L — ABNORMAL LOW (ref 101–111)
Creatinine, Ser: 5.94 mg/dL — ABNORMAL HIGH (ref 0.61–1.24)
GFR calc Af Amer: 10 mL/min — ABNORMAL LOW (ref >60)
GFR calc non Af Amer: 8 mL/min — ABNORMAL LOW (ref >60)
Glucose, Bld: 224 mg/dL — ABNORMAL HIGH (ref 65–99)
Potassium: 4.6 mmol/L (ref 3.5–5.1)
Sodium: 138 mmol/L (ref 135–145)

## 2015-01-12 LAB — APTT: aPTT: 34 seconds (ref 24–36)

## 2015-01-12 LAB — CBC
HCT: 32 % — ABNORMAL LOW (ref 40.0–52.0)
Hemoglobin: 10.3 g/dL — ABNORMAL LOW (ref 13.0–18.0)
MCH: 30.3 pg (ref 26.0–34.0)
MCHC: 32.1 g/dL (ref 32.0–36.0)
MCV: 94.5 fL (ref 80.0–100.0)
Platelets: 169 10*3/uL (ref 150–440)
RBC: 3.38 MIL/uL — ABNORMAL LOW (ref 4.40–5.90)
RDW: 17.9 % — ABNORMAL HIGH (ref 11.5–14.5)
WBC: 16.8 10*3/uL — ABNORMAL HIGH (ref 3.8–10.6)

## 2015-01-12 NOTE — Patient Instructions (Signed)
  Your procedure is scheduled on: Thursday January 14, 2015. Report to Same Day Surgery. To find out your arrival time please call [phone] between 1PM - 3PM on Wednesday January 13, 2015.  Remember: Instructions that are not followed completely may result in serious medical risk, up to and including death, or upon the discretion of your surgeon and anesthesiologist your surgery may need to be rescheduled.    __x__ 1. Do not eat food or drink liquids after midnight. No gum chewing or hard candies.     ____ 2. No Alcohol for 24 hours before or after surgery.   ____ 3. Bring all medications with you on the day of surgery if instructed.    __x__ 4. Notify your doctor if there is any change in your medical condition     (cold, fever, infections).     Do not wear jewelry, make-up, hairpins, clips or nail polish.  Do not wear lotions, powders, or perfumes. You may wear deodorant.  Do not shave 48 hours prior to surgery. Men may shave face and neck.  Do not bring valuables to the hospital.    [facility] is not responsible for any belongings or valuables.               Contacts, dentures or bridgework may not be worn into surgery.  Leave your suitcase in the car. After surgery it may be brought to your room.  For patients admitted to the hospital, discharge time is determined by your  treatment team.   Patients discharged the day of surgery will not be allowed to drive home.    Please read over the following fact sheets that you were given:   [facility] Preparing for Surgery  _x_ Take these medicines the morning of surgery with A SIP OF WATER:    1. cloNIDine (CATAPRES)  2. labetalol (NORMODYNE)  3. cinacalcet (SENSIPAR)   ____ Fleet Enema (as directed)   _x___ Use CHG Soap as directed  ____ Use inhalers on the day of surgery  ____ Stop metformin 2 days prior to surgery    ____ Take 1/2 of usual insulin dose the night before surgery and none on the morning of surgery.   ____  Stop Coumadin/Plavix/aspirin on does not apply.  ____ Stop Anti-inflammatories on does not apply.  Tylenol OK to take for pain.   ____ Stop supplements until after surgery.    ____ Bring C-Pap to the hospital.

## 2015-01-14 ENCOUNTER — Encounter
Admission: RE | Disposition: A | Discharge status: Home / Self Care | Payer: Self-pay | Source: Ambulatory Visit | Attending: Vascular Surgery

## 2015-01-14 ENCOUNTER — Ambulatory Visit: Payer: Medicare HMO | Admitting: Anesthesiology

## 2015-01-14 ENCOUNTER — Encounter: Payer: Self-pay | Admitting: Vascular Surgery

## 2015-01-14 ENCOUNTER — Ambulatory Visit
Admission: RE | Admit: 2015-01-14 | Discharge: 2015-01-14 | Disposition: A | Discharge status: Home / Self Care | Payer: Medicare HMO | Source: Ambulatory Visit | Attending: Vascular Surgery | Admitting: Vascular Surgery

## 2015-01-14 DIAGNOSIS — Z79899 Other long term (current) drug therapy: Secondary | ICD-10-CM | POA: Insufficient documentation

## 2015-01-14 DIAGNOSIS — Z4902 Encounter for fitting and adjustment of peritoneal dialysis catheter: Secondary | ICD-10-CM | POA: Diagnosis not present

## 2015-01-14 DIAGNOSIS — N186 End stage renal disease: Secondary | ICD-10-CM | POA: Insufficient documentation

## 2015-01-14 DIAGNOSIS — I120 Hypertensive chronic kidney disease with stage 5 chronic kidney disease or end stage renal disease: Secondary | ICD-10-CM | POA: Insufficient documentation

## 2015-01-14 DIAGNOSIS — Z992 Dependence on renal dialysis: Secondary | ICD-10-CM | POA: Insufficient documentation

## 2015-01-14 DIAGNOSIS — E1122 Type 2 diabetes mellitus with diabetic chronic kidney disease: Secondary | ICD-10-CM | POA: Insufficient documentation

## 2015-01-14 DIAGNOSIS — Z87891 Personal history of nicotine dependence: Secondary | ICD-10-CM | POA: Insufficient documentation

## 2015-01-14 HISTORY — PX: REMOVAL OF A DIALYSIS CATHETER: SHX6053

## 2015-01-14 LAB — GLUCOSE, CAPILLARY: Glucose-Capillary: 99 mg/dL (ref 65–99)

## 2015-01-14 LAB — POTASSIUM: Potassium, serum: 4.4

## 2015-01-14 SURGERY — REMOVAL, DIALYSIS CATHETER
Anesthesia: General | Wound class: Clean

## 2015-01-14 MED ORDER — FAMOTIDINE 20 MG PO TABS
ORAL_TABLET | ORAL | Status: AC
  Filled 2015-01-14: qty 1

## 2015-01-14 MED ORDER — FAMOTIDINE 20 MG PO TABS
20.0000 mg | ORAL_TABLET | Freq: Once | ORAL | Status: AC
  Administered 2015-01-14: 20 mg via ORAL

## 2015-01-14 MED ORDER — PROPOFOL 10 MG/ML IV BOLUS
INTRAVENOUS | Status: DC | PRN
  Administered 2015-01-14: 20 mg via INTRAVENOUS

## 2015-01-14 MED ORDER — LIDOCAINE HCL (PF) 1 % IJ SOLN
INTRAMUSCULAR | Status: AC
  Filled 2015-01-14: qty 30

## 2015-01-14 MED ORDER — BUPIVACAINE HCL (PF) 0.5 % IJ SOLN
INTRAMUSCULAR | Status: AC
  Filled 2015-01-14: qty 30

## 2015-01-14 MED ORDER — CEFAZOLIN SODIUM 1-5 GM-% IV SOLN
1.0000 g | Freq: Once | INTRAVENOUS | Status: AC
  Administered 2015-01-14: 1 g via INTRAVENOUS

## 2015-01-14 MED ORDER — FENTANYL CITRATE (PF) 100 MCG/2ML IJ SOLN
INTRAMUSCULAR | Status: DC | PRN
  Administered 2015-01-14: 100 ug via INTRAVENOUS

## 2015-01-14 MED ORDER — SODIUM CHLORIDE 0.9 % IV SOLN
INTRAVENOUS | Status: DC
  Administered 2015-01-14: 13:00:00 via INTRAVENOUS

## 2015-01-14 MED ORDER — CEFAZOLIN SODIUM 1-5 GM-% IV SOLN
INTRAVENOUS | Status: AC
  Filled 2015-01-14: qty 50

## 2015-01-14 MED ORDER — HYDROCODONE-ACETAMINOPHEN 5-325 MG PO TABS: 1.0000 | ORAL_TABLET | Freq: Four times a day (QID) | ORAL | Status: DC | PRN

## 2015-01-14 MED ORDER — ONDANSETRON HCL 4 MG/2ML IJ SOLN: 4.0000 mg | Freq: Once | INTRAMUSCULAR | Status: DC | PRN

## 2015-01-14 MED ORDER — FENTANYL CITRATE (PF) 100 MCG/2ML IJ SOLN: 25.0000 ug | INTRAMUSCULAR | Status: DC | PRN

## 2015-01-14 MED ORDER — LIDOCAINE HCL (PF) 1 % IJ SOLN
INTRAMUSCULAR | Status: DC | PRN
  Administered 2015-01-14: 20 mL

## 2015-01-14 MED ORDER — MIDAZOLAM HCL 2 MG/2ML IJ SOLN
INTRAMUSCULAR | Status: DC | PRN
  Administered 2015-01-14: 1 mg via INTRAVENOUS

## 2015-01-14 SURGICAL SUPPLY — 21 items
CANISTER SUCT 1200ML W/VALVE (MISCELLANEOUS) ×2 IMPLANT
CHLORAPREP W/TINT 26ML (MISCELLANEOUS) ×2 IMPLANT
DRAPE LAPAROTOMY TRNSV 106X77 (MISCELLANEOUS) ×2 IMPLANT
DRSG TEGADERM 4X4.75 (GAUZE/BANDAGES/DRESSINGS) ×2 IMPLANT
DRSG VAC ATS MED SENSATRAC (GAUZE/BANDAGES/DRESSINGS) ×2 IMPLANT
GAUZE SPONGE 4X4 12PLY STRL (GAUZE/BANDAGES/DRESSINGS) ×2 IMPLANT
GLOVE BIO SURGEON STRL SZ7 (GLOVE) ×8 IMPLANT
GOWN STRL REUS W/ TWL LRG LVL3 (GOWN DISPOSABLE) ×3 IMPLANT
GOWN STRL REUS W/ TWL XL LVL3 (GOWN DISPOSABLE) ×1 IMPLANT
GOWN STRL REUS W/TWL LRG LVL3 (GOWN DISPOSABLE) ×3
GOWN STRL REUS W/TWL XL LVL3 (GOWN DISPOSABLE) ×1
KIT RM TURNOVER STRD PROC AR (KITS) ×2 IMPLANT
LABEL OR SOLS (LABEL) ×2 IMPLANT
LIQUID BAND (GAUZE/BANDAGES/DRESSINGS) ×2 IMPLANT
NDL SAFETY 25GX1.5 (NEEDLE) ×2 IMPLANT
NS IRRIG 500ML POUR BTL (IV SOLUTION) ×2 IMPLANT
PACK BASIN MINOR ARMC (MISCELLANEOUS) ×2 IMPLANT
PAD GROUND ADULT SPLIT (MISCELLANEOUS) ×2 IMPLANT
SUT MNCRL AB 4-0 PS2 18 (SUTURE) ×2 IMPLANT
SUT VICRYL+ 3-0 36IN CT-1 (SUTURE) ×2 IMPLANT
SYRINGE 10CC LL (SYRINGE) ×2 IMPLANT

## 2015-01-14 NOTE — Op Note (Signed)
[facility]   OPERATIVE NOTE  DATE: 01/14/2015  PRE-OPERATIVE DIAGNOSIS: End-stage renal disease now using a fistula for hemodialysis no longer doing peritoneal dialysis  POST-OPERATIVE DIAGNOSIS: Same as above  PROCEDURE: 1.   Removal of peritoneal dialysis catheter  SURGEON: Elin Seats  ASSISTANT(S): Hezzie Bump PAC  ANESTHESIA: Mac  ESTIMATED BLOOD LOSS: Minimal cc  FINDING(S): 1.  None  SPECIMEN(S):  Picture of peritoneal dialysis catheter taken as removed explant  INDICATIONS:   Jonathan Mcgrath is a 74 y.o. male who presents with end-stage renal disease no longer using his peritoneal dialysis catheter. He is now getting hemodialysis through his fistula and this is working well. He desires to have his peritoneal dialysis catheter removed.  DESCRIPTION: After obtaining full informed written consent, the patient was brought back to the operating room and placed supine upon the operating table.  The patient received IV antibiotics prior to induction.  After obtaining adequate anesthesia, the patient was prepped and draped in the standard fashion for removal of his catheter. We anesthetized the area copiously with a combination of 1% lidocaine and quarter percent Marcaine. Hemostats were then used to help dissect out the superficial cuff from around the exit site. The catheter was then removed with gentle traction without difficulty from the exit site without counter incision. Sterile dressing was placed. The patient was taken to the recovery room in stable condition having tolerated the procedure well.  COMPLICATIONS: None  CONDITION: Stable  Mishayla Sliwinski  01/14/2015, 3:07 PM

## 2015-01-14 NOTE — Discharge Instructions (Addendum)
No heavy lifting or driving for 3 days.       Call or contact our office with fever >101,  wound redness or drainage, severe pain, or other issues.AMBULATORY SURGERY     DISCHARGE INSTRUCTIONS   1) The drugs that you were given will stay in your system until tomorrow so for the next 24 hours you should not:  A) Drive an automobile B) Make any legal decisions C) Drink any alcoholic beverage   2) You may resume regular meals tomorrow.  Today it is better to start with liquids and gradually work up to solid foods.  You may eat anything you prefer, but it is better to start with liquids, then soup and crackers, and gradually work up to solid foods.   3) Please notify your doctor immediately if you have any unusual bleeding, trouble breathing, redness and pain at the surgery site, drainage, fever, or pain not relieved by medication. 4)   5) Your post-operative visit with Dr.                                     is: Date:                        Time:    Please call to schedule your post-operative visit.  6) Additional Instructions:

## 2015-01-14 NOTE — H&P (Signed)
[facility] History & Physical Update  The patient was interviewed and re-examined.  The patient's previous History and Physical has been reviewed and is unchanged.  There is no change in the plan of care. We plan to proceed with the scheduled procedure.  Lyle Niblett, MD  01/14/2015, 2:02 PM

## 2015-01-14 NOTE — Anesthesia Preprocedure Evaluation (Addendum)
Anesthesia Evaluation  Patient identified by MRN, date of birth, ID band Patient awake    Reviewed: Allergy & Precautions, NPO status , Patient's Chart, lab work & pertinent test results  History of Anesthesia Complications Negative for: history of anesthetic complications  Airway Mallampati: III  TM Distance: >3 FB Neck ROM: Full    Dental  (+) Partial Upper   Pulmonary neg pulmonary ROS,  breath sounds clear to auscultation  Pulmonary exam normal       Cardiovascular Exercise Tolerance: Good hypertension, Pt. on medications negative cardio ROS Normal cardiovascular examRhythm:Regular Rate:Normal     Neuro/Psych negative neurological ROS  negative psych ROS   GI/Hepatic negative GI ROS, Neg liver ROS,   Endo/Other  negative endocrine ROS  Renal/GU CRFRenal disease  negative genitourinary   Musculoskeletal negative musculoskeletal ROS (+)   Abdominal   Peds negative pediatric ROS (+)  Hematology negative hematology ROS (+)   Anesthesia Other Findings   Reproductive/Obstetrics negative OB ROS                            Anesthesia Physical Anesthesia Plan  ASA: III  Anesthesia Plan: General   Post-op Pain Management:    Induction: Intravenous  Airway Management Planned: Simple Face Mask  Additional Equipment:   Intra-op Plan:   Post-operative Plan:   Informed Consent: I have reviewed the patients History and Physical, chart, labs and discussed the procedure including the risks, benefits and alternatives for the proposed anesthesia with the patient or authorized representative who has indicated his/her understanding and acceptance.   Dental advisory given  Plan Discussed with: CRNA and Surgeon  Anesthesia Plan Comments:         Anesthesia Quick Evaluation

## 2015-01-14 NOTE — Anesthesia Postprocedure Evaluation (Signed)
  Anesthesia Post-op Note  [name]  Procedure(s) Performed: Procedure(s): REMOVAL OF A  PERITONEAL DIALYSIS CATHETER (N/A)  Anesthesia type:General  Patient location: PACU  Post pain: Pain level controlled  Post assessment: Post-op Vital signs reviewed, Patient's Cardiovascular Status Stable, Respiratory Function Stable, Patent Airway and No signs of Nausea or vomiting  Post vital signs: Reviewed and stable  Last Vitals:  Filed Vitals:   01/14/15 1533  BP:   Pulse:   Temp: 36.7 C  Resp:     Level of consciousness: awake, alert  and patient cooperative  Complications: No apparent anesthesia complications

## 2015-01-14 NOTE — Transfer of Care (Signed)
Immediate Anesthesia Transfer of Care Note  [name]  Procedure(s) Performed: Procedure(s): REMOVAL OF A DIALYSIS CATHETER (N/A)  Patient Location: PACU  Anesthesia Type:MAC  Level of Consciousness: awake, alert , oriented and patient cooperative  Airway & Oxygen Therapy: Patient Spontanous Breathing and Patient connected to nasal cannula oxygen  Post-op Assessment: Report given to RN and Post -op Vital signs reviewed and stable  Post vital signs: Reviewed and stable  Last Vitals:  Filed Vitals:   01/14/15 1515  BP: 164/76  Pulse: 69  Temp: 36.7 C  Resp: 16    Complications: No apparent anesthesia complications

## 2015-02-21 ENCOUNTER — Emergency Department (HOSPITAL_COMMUNITY)
Admission: EM | Admit: 2015-02-21 | Discharge: 2015-02-21 | Disposition: A | Discharge status: Home / Self Care | Payer: Medicare HMO | Attending: Emergency Medicine | Admitting: Emergency Medicine

## 2015-02-21 ENCOUNTER — Encounter (HOSPITAL_COMMUNITY): Payer: Self-pay | Admitting: Emergency Medicine

## 2015-02-21 ENCOUNTER — Emergency Department (HOSPITAL_COMMUNITY): Payer: Medicare HMO

## 2015-02-21 DIAGNOSIS — I120 Hypertensive chronic kidney disease with stage 5 chronic kidney disease or end stage renal disease: Secondary | ICD-10-CM | POA: Insufficient documentation

## 2015-02-21 DIAGNOSIS — Z992 Dependence on renal dialysis: Secondary | ICD-10-CM | POA: Diagnosis not present

## 2015-02-21 DIAGNOSIS — R55 Syncope and collapse: Secondary | ICD-10-CM | POA: Diagnosis not present

## 2015-02-21 DIAGNOSIS — N186 End stage renal disease: Secondary | ICD-10-CM | POA: Diagnosis not present

## 2015-02-21 DIAGNOSIS — Z79899 Other long term (current) drug therapy: Secondary | ICD-10-CM | POA: Diagnosis not present

## 2015-02-21 LAB — BASIC METABOLIC PANEL
Anion gap: 12 (ref 5–15)
BUN: 54 mg/dL — ABNORMAL HIGH (ref 6–20)
CO2: 28 mmol/L (ref 22–32)
Calcium: 8.4 mg/dL — ABNORMAL LOW (ref 8.9–10.3)
Chloride: 98 mmol/L — ABNORMAL LOW (ref 101–111)
Creatinine, Ser: 8.61 mg/dL — ABNORMAL HIGH (ref 0.61–1.24)
GFR calc Af Amer: 6 mL/min — ABNORMAL LOW (ref >60)
GFR calc non Af Amer: 5 mL/min — ABNORMAL LOW (ref >60)
Glucose, Bld: 162 mg/dL — ABNORMAL HIGH (ref 65–99)
Potassium: 4.6 mmol/L (ref 3.5–5.1)
Sodium: 138 mmol/L (ref 135–145)

## 2015-02-21 LAB — CBC WITH DIFFERENTIAL/PLATELET
Basophils Absolute: 0 10*3/uL (ref 0.0–0.1)
Basophils Relative: 0 % (ref 0–1)
Eosinophils Absolute: 0.2 10*3/uL (ref 0.0–0.7)
Eosinophils Relative: 1 % (ref 0–5)
HCT: 36.2 % — ABNORMAL LOW (ref 39.0–52.0)
Hemoglobin: 12 g/dL — ABNORMAL LOW (ref 13.0–17.0)
Lymphocytes Relative: 74 % — ABNORMAL HIGH (ref 12–46)
Lymphs Abs: 11.4 10*3/uL — ABNORMAL HIGH (ref 0.7–4.0)
MCH: 30.2 pg (ref 26.0–34.0)
MCHC: 33.1 g/dL (ref 30.0–36.0)
MCV: 91 fL (ref 78.0–100.0)
Monocytes Absolute: 0.8 10*3/uL (ref 0.1–1.0)
Monocytes Relative: 5 % (ref 3–12)
Neutro Abs: 3.1 10*3/uL (ref 1.7–7.7)
Neutrophils Relative %: 20 % — ABNORMAL LOW (ref 43–77)
Platelets: 113 10*3/uL — ABNORMAL LOW (ref 150–400)
RBC: 3.98 MIL/uL — ABNORMAL LOW (ref 4.22–5.81)
RDW: 14.6 % (ref 11.5–15.5)
WBC: 15.5 10*3/uL — ABNORMAL HIGH (ref 4.0–10.5)

## 2015-02-21 LAB — I-STAT TROPONIN, ED: Troponin i, poc: 0.03 ng/mL (ref 0.00–0.08)

## 2015-02-21 MED ORDER — SODIUM CHLORIDE 0.9 % IV BOLUS (SEPSIS)
1000.0000 mL | Freq: Once | INTRAVENOUS | Status: AC
  Administered 2015-02-21: 1000 mL via INTRAVENOUS

## 2015-02-21 NOTE — ED Notes (Signed)
OSVS obtained prior to administration of saline

## 2015-02-21 NOTE — Discharge Instructions (Signed)
Follow up with your Primary Care Physician.    Near-Syncope Near-syncope (commonly known as near fainting) is sudden weakness, dizziness, or feeling like you might pass out. During an episode of near-syncope, you may also develop pale skin, have tunnel vision, or feel sick to your stomach (nauseous). Near-syncope may occur when getting up after sitting or while standing for a long time. It is caused by a sudden decrease in blood flow to the brain. This decrease can result from various causes or triggers, most of which are not serious. However, because near-syncope can sometimes be a sign of something serious, a medical evaluation is required. The specific cause is often not determined. HOME CARE INSTRUCTIONS  Monitor your condition for any changes. The following actions may help to alleviate any discomfort you are experiencing:  Have someone stay with you until you feel stable.  Lie down right away and prop your feet up if you start feeling like you might faint. Breathe deeply and steadily. Wait until all the symptoms have passed. Most of these episodes last only a few minutes. You may feel tired for several hours.   Drink enough fluids to keep your urine clear or pale yellow.   If you are taking blood pressure or heart medicine, get up slowly when seated or lying down. Take several minutes to sit and then stand. This can reduce dizziness.  Follow up with your health care provider as directed. SEEK IMMEDIATE MEDICAL CARE IF:   You have a severe headache.   You have unusual pain in the chest, abdomen, or back.   You are bleeding from the mouth or rectum, or you have black or tarry stool.   You have an irregular or very fast heartbeat.   You have repeated fainting or have seizure-like jerking during an episode.   You faint when sitting or lying down.   You have confusion.   You have difficulty walking.   You have severe weakness.   You have vision problems.  MAKE SURE  YOU:   Understand these instructions.  Will watch your condition.  Will get help right away if you are not doing well or get worse. Document Released: 07/03/2005 Document Revised: 07/08/2013 Document Reviewed: 12/06/2012 [facility] Patient Information 2015 [place], [place]. This information is not intended to replace advice given to you by your health care provider. Make sure you discuss any questions you have with your health care provider.

## 2015-02-21 NOTE — ED Provider Notes (Signed)
CSN: [id number]     Arrival date & time 02/21/15  1154 History   First MD Initiated Contact with Patient 02/21/15 1157     Chief Complaint  Patient presents with  . Near Syncope     (Consider location/radiation/quality/duration/timing/severity/associated sxs/prior Treatment) HPI Comments: Patient presents today with a chief complaint of near syncope.  He states that he was in his normal state of health this morning and went to church.  While at church when he stood up he felt lightheaded and fell to the ground.  He reports falling unto his buttocks and states that he did not hit his head.  He denies LOC.  He denies any HA, chest pain, SOB, back pain, neck pain, vision changes, focal weakness, numbness, tingling, facial asymmetry, or difficulty swallowing.  He reports that his dizziness has resolved at this time.  He states that he currently takes Labetalol 200 mg and Clonidine 0.3 mg  for HTN.  He reports taking both of these medications this morning.  He states that she had been taking the medications twice daily, but his dose was recently decreased to once daily 1 week ago due to hypotension.  He is a dialysis patient.  He reports last dialysis was two days ago.  The history is provided by the patient.    Past Medical History  Diagnosis Date  . Chronic kidney disease   . Hypertension    Past Surgical History  Procedure Laterality Date  . Av fistula placement Left 2015    arm  . Peritoneal catheter insertion N/A   . Removal of a dialysis catheter N/A 01/14/2015    Procedure: REMOVAL OF A  PERITONEAL DIALYSIS CATHETER;  Surgeon: Algernon Huxley, MD;  Location: [facility] ORS;  Service: Vascular;  Laterality: N/A;   No family history on file. History  Substance Use Topics  . Smoking status: Never Smoker   . Smokeless tobacco: Not on file  . Alcohol Use: No    Review of Systems  All other systems reviewed and are negative.     Allergies  Review of patient's allergies indicates no known  allergies.  Home Medications   Prior to Admission medications   Medication Sig Start Date End Date Taking? Authorizing Provider  cinacalcet (SENSIPAR) 30 MG tablet Take 30 mg by mouth every morning.    Historical Provider, MD  cloNIDine (CATAPRES) 0.3 MG tablet Take 0.3 mg by mouth 2 (two) times daily.    Historical Provider, MD  furosemide (LASIX) 20 MG tablet Take 20 mg by mouth every morning.    Historical Provider, MD  HYDROcodone-acetaminophen (NORCO) 5-325 MG per tablet Take 1 tablet by mouth every 6 (six) hours as needed for moderate pain. 01/14/15   Algernon Huxley, MD  labetalol (NORMODYNE) 200 MG tablet Take 200 mg by mouth every morning.    Historical Provider, MD   BP 138/66 mmHg  Pulse 69  Temp(Src) 97.5 F (36.4 C) (Axillary)  Resp 15  Wt 200 lb 9.9 oz (91 kg)  SpO2 100% Physical Exam  Constitutional: He appears well-developed and well-nourished.  HENT:  Head: Normocephalic and atraumatic.  Mouth/Throat: Oropharynx is clear and moist.  Eyes: EOM are normal. Pupils are equal, round, and reactive to light.  Neck: Normal range of motion. Neck supple.  Cardiovascular: Normal rate, regular rhythm and normal heart sounds.   Pulmonary/Chest: Effort normal and breath sounds normal.  Abdominal: Soft. There is no tenderness.  Musculoskeletal: Normal range of motion.  Full ROM of  all extremities without pain No spinal tenderness to palpation.  Neurological: He is alert. He has normal strength. No cranial nerve deficit or sensory deficit. Coordination and gait normal.  Skin: Skin is warm and dry.  Psychiatric: He has a normal mood and affect.  Nursing note and vitals reviewed.   ED Course  Procedures (including critical care time) Labs Review Labs Reviewed  CBC WITH DIFFERENTIAL/PLATELET  BASIC METABOLIC PANEL  Randolm Idol, ED    Imaging Review Dg Chest 2 View  02/21/2015   CLINICAL DATA:  Near syncopal episode. History of hypertension and chronic kidney disease.  Initial encounter.  EXAM: CHEST  2 VIEW  COMPARISON:  None.  FINDINGS: The heart is mildly enlarged. The mediastinal contours are normal. The lungs are clear. There is no pleural effusion or pneumothorax. No acute osseous findings demonstrated. Asymmetric glenohumeral degenerative changes are present on the right. Multiple telemetry leads overlie the chest.  IMPRESSION: Cardiomegaly.  No acute cardiopulmonary process.   Electronically Signed   By: Richardean Sale M.D.   On: 02/21/2015 13:19     EKG Interpretation None     2:00 PM Reassessed patient.  Patient stood up and ambulated without dizziness or difficulty. MDM   Final diagnoses:  None   Patient presents today with near syncope that occurred while at church this morning.  He states that he felt dizzy and then fell to the ground, but never completely loss consciousness.  Patient is a dialysis patient.  He was found to orthostatic initially in the ED.  Symptoms improved after given IVF.  Normal neurological exam.  No signs of head trauma.  Therefore, do not feel that a CT head is indicated.  He denies any chest pain or any other pain.  Full ROM of all extremities.  Labs today unremarkable.  No ischemic changes on EKG. Initially a code STEMI was called by EMS in the field.  However, this was canceled by Dr. Ellyn Hack with Cardiology.  Patient with no chest pain, SOB, jaw pain, neck pain, or arm pain.   Troponin negative.  Patient able to ambulate in the ED.  Feel that the patient is stable for discharge.  Return precautions given.  Patient also evaluated by Dr. Venora Maples who is in agreement with the plan.    Hyman Bible, PA-C 02/21/15 [place], MD 02/21/15 [phone]

## 2015-02-21 NOTE — ED Notes (Signed)
PA at bedside.

## 2015-02-21 NOTE — ED Notes (Signed)
Patient transported to X-ray 

## 2015-02-21 NOTE — ED Notes (Signed)
Cancelled Stemi per Dr. Ellyn Hack

## 2015-02-21 NOTE — ED Notes (Signed)
Per [facility]: near syncopal episode, never lost conciousness,  Was at church, just had stood up felt dizzy and had fallen, denies hitting head, denies pain.  Brought in as a possible stemi because of ecg changes.  Stemi was cancelled upon arrival to facility.  Had dialysis on Friday, full treatment.  91 kg dry weight.   Has felt weak since that dialysis treatment.  No changes other than the weakness, denies cp, arm pain, back pain, neck pain, nausea, sob.  VSS, CBG wnl.

## 2015-02-23 LAB — PATHOLOGIST SMEAR REVIEW

## 2016-01-06 ENCOUNTER — Other Ambulatory Visit: Payer: Self-pay | Admitting: Vascular Surgery

## 2016-01-12 ENCOUNTER — Ambulatory Visit
Admission: RE | Admit: 2016-01-12 | Discharge: 2016-01-12 | Disposition: A | Discharge status: Home / Self Care | Payer: Medicare HMO | Source: Ambulatory Visit | Attending: Vascular Surgery | Admitting: Vascular Surgery

## 2016-01-12 ENCOUNTER — Encounter
Admission: RE | Disposition: A | Discharge status: Home / Self Care | Payer: Self-pay | Source: Ambulatory Visit | Attending: Vascular Surgery

## 2016-01-12 DIAGNOSIS — I120 Hypertensive chronic kidney disease with stage 5 chronic kidney disease or end stage renal disease: Secondary | ICD-10-CM | POA: Insufficient documentation

## 2016-01-12 DIAGNOSIS — Y832 Surgical operation with anastomosis, bypass or graft as the cause of abnormal reaction of the patient, or of later complication, without mention of misadventure at the time of the procedure: Secondary | ICD-10-CM | POA: Insufficient documentation

## 2016-01-12 DIAGNOSIS — Z992 Dependence on renal dialysis: Secondary | ICD-10-CM | POA: Diagnosis not present

## 2016-01-12 DIAGNOSIS — T82858A Stenosis of vascular prosthetic devices, implants and grafts, initial encounter: Secondary | ICD-10-CM | POA: Insufficient documentation

## 2016-01-12 DIAGNOSIS — N186 End stage renal disease: Secondary | ICD-10-CM | POA: Diagnosis not present

## 2016-01-12 HISTORY — PX: PERIPHERAL VASCULAR CATHETERIZATION: SHX172C

## 2016-01-12 LAB — POTASSIUM (ARMC VASCULAR LAB ONLY): Potassium (ARMC vascular lab): 3.4 — ABNORMAL LOW (ref 3.5–5.1)

## 2016-01-12 SURGERY — A/V SHUNTOGRAM/FISTULAGRAM
Anesthesia: Moderate Sedation | Site: Arm Upper

## 2016-01-12 MED ORDER — FENTANYL CITRATE (PF) 100 MCG/2ML IJ SOLN
INTRAMUSCULAR | Status: AC
Start: 2016-01-12 — End: 2016-01-12
  Filled 2016-01-12: qty 4

## 2016-01-12 MED ORDER — HEPARIN (PORCINE) IN NACL 2-0.9 UNIT/ML-% IJ SOLN
INTRAMUSCULAR | Status: AC
  Filled 2016-01-12: qty 500

## 2016-01-12 MED ORDER — DEXTROSE 5 % IV SOLN
1.5000 g | INTRAVENOUS | Status: AC
  Administered 2016-01-12: 1.5 g via INTRAVENOUS

## 2016-01-12 MED ORDER — MIDAZOLAM HCL 5 MG/5ML IJ SOLN
INTRAMUSCULAR | Status: AC
  Filled 2016-01-12: qty 5

## 2016-01-12 MED ORDER — FENTANYL CITRATE (PF) 100 MCG/2ML IJ SOLN
INTRAMUSCULAR | Status: DC | PRN
  Administered 2016-01-12: 25 ug via INTRAVENOUS
  Administered 2016-01-12: 50 ug via INTRAVENOUS

## 2016-01-12 MED ORDER — ONDANSETRON HCL 4 MG/2ML IJ SOLN: 4.0000 mg | Freq: Four times a day (QID) | INTRAMUSCULAR | Status: DC | PRN

## 2016-01-12 MED ORDER — OXYCODONE-ACETAMINOPHEN 5-325 MG PO TABS: 1.0000 | ORAL_TABLET | ORAL | Status: DC | PRN

## 2016-01-12 MED ORDER — ACETAMINOPHEN 325 MG RE SUPP
325.0000 mg | RECTAL | Status: DC | PRN
  Filled 2016-01-12: qty 2

## 2016-01-12 MED ORDER — MIDAZOLAM HCL 2 MG/2ML IJ SOLN
INTRAMUSCULAR | Status: DC | PRN
  Administered 2016-01-12: 1 mg via INTRAVENOUS
  Administered 2016-01-12: 2 mg via INTRAVENOUS

## 2016-01-12 MED ORDER — HYDRALAZINE HCL 20 MG/ML IJ SOLN: 5.0000 mg | INTRAMUSCULAR | Status: DC | PRN

## 2016-01-12 MED ORDER — HEPARIN SODIUM (PORCINE) 1000 UNIT/ML IJ SOLN
INTRAMUSCULAR | Status: AC
Start: 2016-01-12 — End: 2016-01-12
  Filled 2016-01-12: qty 1

## 2016-01-12 MED ORDER — ALUM & MAG HYDROXIDE-SIMETH 200-200-20 MG/5ML PO SUSP: 15.0000 mL | ORAL | Status: DC | PRN

## 2016-01-12 MED ORDER — GUAIFENESIN-DM 100-10 MG/5ML PO SYRP: 15.0000 mL | ORAL_SOLUTION | ORAL | Status: DC | PRN

## 2016-01-12 MED ORDER — HEPARIN SODIUM (PORCINE) 1000 UNIT/ML IJ SOLN
INTRAMUSCULAR | Status: DC | PRN
  Administered 2016-01-12: 3000 [IU] via INTRAVENOUS

## 2016-01-12 MED ORDER — LABETALOL HCL 5 MG/ML IV SOLN: 10.0000 mg | INTRAVENOUS | Status: DC | PRN

## 2016-01-12 MED ORDER — LIDOCAINE-EPINEPHRINE (PF) 1 %-1:200000 IJ SOLN
INTRAMUSCULAR | Status: AC
Start: 2016-01-12 — End: 2016-01-12
  Filled 2016-01-12: qty 30

## 2016-01-12 MED ORDER — SODIUM CHLORIDE 0.9 % IV SOLN: INTRAVENOUS | Status: DC

## 2016-01-12 MED ORDER — FAMOTIDINE 20 MG PO TABS: 40.0000 mg | ORAL_TABLET | ORAL | Status: DC | PRN

## 2016-01-12 MED ORDER — ACETAMINOPHEN 325 MG PO TABS: 325.0000 mg | ORAL_TABLET | ORAL | Status: DC | PRN

## 2016-01-12 MED ORDER — HYDROMORPHONE HCL 1 MG/ML IJ SOLN: 1.0000 mg | Freq: Once | INTRAMUSCULAR | Status: DC

## 2016-01-12 MED ORDER — IOPAMIDOL (ISOVUE-300) INJECTION 61%
INTRAVENOUS | Status: DC | PRN
  Administered 2016-01-12: 50 mL via INTRA_ARTERIAL

## 2016-01-12 MED ORDER — METOPROLOL TARTRATE 5 MG/5ML IV SOLN: 2.0000 mg | INTRAVENOUS | Status: DC | PRN

## 2016-01-12 MED ORDER — PHENOL 1.4 % MT LIQD: 1.0000 | OROMUCOSAL | Status: DC | PRN

## 2016-01-12 MED ORDER — MORPHINE SULFATE (PF) 4 MG/ML IV SOLN: 2.0000 mg | INTRAVENOUS | Status: DC | PRN

## 2016-01-12 MED ORDER — METHYLPREDNISOLONE SODIUM SUCC 125 MG IJ SOLR: 125.0000 mg | INTRAMUSCULAR | Status: DC | PRN

## 2016-01-12 MED ORDER — SODIUM CHLORIDE 0.9 % IV SOLN
500.0000 mL | Freq: Once | INTRAVENOUS | Status: DC | PRN
Start: 2016-01-12 — End: 2016-01-12

## 2016-01-12 SURGICAL SUPPLY — 15 items
BALLN DORADO 10X40X80 (BALLOONS) ×3
BALLN DORADO 7X60X80 (BALLOONS) ×3
BALLN ULTRVRSE 10X60X75 (BALLOONS) ×3
BALLOON DORADO 10X40X80 (BALLOONS) ×2 IMPLANT
BALLOON DORADO 7X60X80 (BALLOONS) ×2 IMPLANT
BALLOON ULTRVRSE 10X60X75 (BALLOONS) ×2 IMPLANT
CANNULA 5F STIFF (CANNULA) ×3 IMPLANT
DEVICE PRESTO INFLATION (MISCELLANEOUS) ×3 IMPLANT
DRAPE BRACHIAL (DRAPES) ×3 IMPLANT
PACK ANGIOGRAPHY (CUSTOM PROCEDURE TRAY) ×3 IMPLANT
SHEATH 9FRX11 (SHEATH) ×3 IMPLANT
SHEATH BRITE TIP 6FRX5.5 (SHEATH) ×3 IMPLANT
STENT VIABAHN 9X50X120 (Permanent Stent) ×6 IMPLANT
TOWEL OR 17X26 4PK STRL BLUE (TOWEL DISPOSABLE) ×3 IMPLANT
WIRE MAGIC TOR.035 180C (WIRE) ×3 IMPLANT

## 2016-01-12 NOTE — H&P (Signed)
[facility] Admission History & Physical  MRN : [id number]  Jonathan Mcgrath is a 75 y.o. ([birth year]) male who presents with chief complaint of No chief complaint on file. Marland Kitchen  History of Present Illness: Patient is sent over from his dialysis access center due to poor flows and diminished function of his left arm access. He has not had any systemic complaints. He has no fevers or chills. He has no chest pain or shortness of breath. He reports no prolonged bleeding or difficulties with gaining access.  Current Facility-Administered Medications  Medication Dose Route Frequency Provider Last Rate Last Dose  . 0.9 %  sodium chloride infusion   Intravenous Continuous Kimberly A Stegmayer, PA-C      . cefUROXime (ZINACEF) 1.5 g in dextrose 5 % 50 mL IVPB  1.5 g Intravenous 30 min Pre-Op Kimberly A Stegmayer, PA-C      . famotidine (PEPCID) tablet 40 mg  40 mg Oral PRN Janalyn Harder Stegmayer, PA-C      . HYDROmorphone (DILAUDID) injection 1 mg  1 mg Intravenous Once [employer], PA-C      . methylPREDNISolone sodium succinate (SOLU-MEDROL) 125 mg/2 mL injection 125 mg  125 mg Intravenous PRN Kimberly A Stegmayer, PA-C      . ondansetron (ZOFRAN) injection 4 mg  4 mg Intravenous Q6H PRN [name], PA-C        Past Medical History  Diagnosis Date  . Chronic kidney disease   . Hypertension     Past Surgical History  Procedure Laterality Date  . Av fistula placement Left 2015    arm  . Peritoneal catheter insertion N/A   . Removal of a dialysis catheter N/A 01/14/2015    Procedure: REMOVAL OF A  PERITONEAL DIALYSIS CATHETER;  Surgeon: Algernon Huxley, MD;  Location: [facility] ORS;  Service: Vascular;  Laterality: N/A;    Social History Social History  Substance Use Topics  . Smoking status: Never Smoker   . Smokeless tobacco: Not on file  . Alcohol Use: No  No IVDU  Family History No bleeding disorders, clotting disorders, aneurysms, or autoimmune  diseases  No Known Allergies   REVIEW OF SYSTEMS (Negative unless checked)  Constitutional: [] Weight loss  [] Fever  [] Chills Cardiac: [] Chest pain   [] Chest pressure   [] Palpitations   [] Shortness of breath when laying flat   [] Shortness of breath at rest   [] Shortness of breath with exertion. Vascular:  [] Pain in legs with walking   [] Pain in legs at rest   [] Pain in legs when laying flat   [] Claudication   [] Pain in feet when walking  [] Pain in feet at rest  [] Pain in feet when laying flat   [] History of DVT   [] Phlebitis   [] Swelling in legs   [] Varicose veins   [] Non-healing ulcers Pulmonary:   [] Uses home oxygen   [] Productive cough   [] Hemoptysis   [] Wheeze  [] COPD   [] Asthma Neurologic:  [] Dizziness  [] Blackouts   [] Seizures   [] History of stroke   [] History of TIA  [] Aphasia   [] Temporary blindness   [] Dysphagia   [] Weakness or numbness in arms   [] Weakness or numbness in legs Musculoskeletal:  [] Arthritis   [] Joint swelling   [] Joint pain   [] Low back pain Hematologic:  [] Easy bruising  [] Easy bleeding   [] Hypercoagulable state   [] Anemic  [] Hepatitis Gastrointestinal:  [] Blood in stool   [] Vomiting blood  [] Gastroesophageal reflux/heartburn   [] Difficulty swallowing. Genitourinary:  [  x]Chronic kidney disease   [] Difficult urination  [] Frequent urination  [] Burning with urination   [] Blood in urine Skin:  [] Rashes   [] Ulcers   [] Wounds Psychological:  [] History of anxiety   []  History of major depression.  Physical Examination  Filed Vitals:   01/12/16 1214  BP: 174/75  Pulse: 69  Temp: 97.7 F (36.5 C)  Resp: 16  Height: 6\' 1"  (1.854 m)  Weight: 95.255 kg (210 lb)  SpO2: 100%   Body mass index is 27.71 kg/(m^2). Gen: WD/WN, NAD Head: [place]/AT, No temporalis wasting. Prominent temp pulse not noted. Ear/Nose/Throat: Hearing grossly intact, nares w/o erythema or drainage, oropharynx w/o Erythema/Exudate,  Eyes: PERRLA, EOMI.  Neck: Supple, no nuchal rigidity.  No JVD.   Pulmonary:  Good air movement, equal bilaterally, no use of accessory muscles.  Cardiac: RRR, normal S1, S2 Vascular: Thrill and bruit present in access in the left arm Vessel Right Left  Radial Palpable Palpable                                   Gastrointestinal: soft, non-tender/non-distended. No guarding/reflex.  Musculoskeletal: M/S 5/5 throughout.  Extremities without ischemic changes.  No deformity or atrophy.  Neurologic: CN 2-12 intact. Pain and light touch intact in extremities.  Symmetrical.  Speech is fluent. Motor exam as listed above. Psychiatric: Judgment intact, Mood & affect appropriate for pt's clinical situation. Dermatologic: No rashes or ulcers noted.  No cellulitis or open wounds. Lymph : No Cervical, Axillary, or Inguinal lymphadenopathy.      CBC Lab Results  Component Value Date   WBC 15.5* 02/21/2015   HGB 12.0* 02/21/2015   HCT 36.2* 02/21/2015   MCV 91.0 02/21/2015   PLT 113* 02/21/2015    BMET    Component Value Date/Time   NA 138 02/21/2015 1224   NA 140 04/13/2014 0507   K 4.6 02/21/2015 1224   K 4.6 04/13/2014 0507   CL 98* 02/21/2015 1224   CL 109* 04/13/2014 0507   CO2 28 02/21/2015 1224   CO2 24 04/13/2014 0507   GLUCOSE 162* 02/21/2015 1224   GLUCOSE 139* 04/13/2014 0507   BUN 54* 02/21/2015 1224   BUN 79* 04/13/2014 0507   CREATININE 8.61* 02/21/2015 1224   CREATININE 9.83* 04/13/2014 0507   CALCIUM 8.4* 02/21/2015 1224   CALCIUM 6.8* 04/13/2014 0507   GFRNONAA 5* 02/21/2015 1224   GFRNONAA 6* 04/13/2014 0507   GFRNONAA 8* 06/05/2013 0604   GFRAA 6* 02/21/2015 1224   GFRAA 7* 04/13/2014 0507   GFRAA 9* 06/05/2013 0604   CrCl cannot be calculated (Patient has no serum creatinine result on file.).  COAG Lab Results  Component Value Date   INR 1.13 01/12/2015   INR 1.0 12/12/2011    Radiology No results found.    Assessment/Plan 1. Dysfunction of dialysis access. Dialysis center has requested a fistulogram  which we will perform today. Risks and benefits discussed. 2. End-stage renal disease. Try to improve his access function as above. 3. Hypertension. Stable outpatient medications.   Corsica Franson, MD  01/12/2016 12:25 PM

## 2016-01-12 NOTE — Op Note (Signed)
[facility]    OPERATIVE NOTE   PROCEDURE: 1.   Left brachiocephalic arteriovenous fistula cannulation under ultrasound guidance 2.   Left arm fistulagram including central venogram 3.   Percutaneous transluminal angioplasty of the mid upper arm cephalic vein with 7 mm and 10 mm diameter high pressure angioplasty balloon 4.   Percutaneous transluminal angioplasty of the cephalic vein/subclavian vein confluence 7 mm and 10 mm diameter high pressure angioplasty balloons 5.   Viabahn covered stent placement to the cephalic vein subclavian vein confluence with 29 mm diameter by 5 cm length balloons for high-grade residual stenosis after angioplasty  PRE-OPERATIVE DIAGNOSIS: 1. ESRD 2. Poorly functional left brachiocephalic AVF  POST-OPERATIVE DIAGNOSIS: same as above   SURGEON: Leotis Pain, MD  ANESTHESIA: local with MCS  ESTIMATED BLOOD LOSS: 30 cc  FINDING(S): 1. 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 male who presents with malfunctioning  left brachiocephalic arteriovenous fistula.  The patient is scheduled for  left arm fistulagram.  The patient is aware the risks include but are not limited to: bleeding, infection, thrombosis of the cannulated access, and possible anaphylactic reaction to the contrast.  The patient is aware of the risks of the procedure and elects to proceed forward.  DESCRIPTION: After full informed written consent was obtained, the patient was brought back to the angiography suite and placed supine upon the angiography table.  The patient was connected to monitoring equipment. Moderate conscious sedation was administered with a face to face  encounter with the patient throughout the procedure with my supervision of the RN administering medicines and monitoring the patient's vital signs and mental status throughout from the start of the procedure until the patient was taken to the recovery room. The  left arm was prepped and draped in the standard fashion for a percutaneous access intervention.  Under ultrasound guidance, the  left brachiocephalic arteriovenous fistula was cannulated with a micropuncture needle under direct ultrasound guidance and a permanent image was performed.  The microwire was advanced into the fistula and the needle was exchanged for the a microsheath.  I then upsized to a 6 Fr Sheath and imaging was performed.  Hand injections were completed to image the access including the central venous system. 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  Based on the images, this patient will need intervention to both areas. I then gave the patient 3000 units of intravenous heparin.  I then crossed the stenoses with a Magic Tourqe wire.  Based on the imaging, a 7 mm x 6 cm  high pressure angioplasty balloon was selected.  The balloon was centered around the mid upper arm cephalic vein stenosis and inflated to 20 ATM for 1 minute(s).  This was quite undersized, and upsized to a 10 mm diameter by 4 cm length high pressure angioplasty balloon for this area. This was inflated to 16 atm for 1 minute. On completion imaging, a 30-35 % residual stenosis was present.   I started by treating the cephalic vein subclavian vein confluence stenosis as well with a 7 mm diameter by 6 cm  length high pressure angioplasty balloon. This was inflated to 20 atm but was clearly undersized. I then upsized to the 10 mm diameter by 4 cm length high pressure angioplasty balloon. The distal tip of the balloon was in the cephalic vein with the proximal portion into the  subclavian vein. This was inflated to 16 atm for 1 minute. Completion angiogram showed slight extravasation but high-grade residual stenosis that had not really improved with intervention and so I had to proceed with stent placement. This required upsizing to a 9 [place] sheath. A 9 mm diameter by 5 cm length Viabahn covered stent at the proximal portion put about a half centimeter into the subclavian vein and encompassing the distal cephalic vein. This did not entirely encompass the lesion and a second 9 mm diameter by 5 cm length stent had to be extended about 2 cm towards the shoulder to entirely treat the lesion. About 3 cm of overlap were present. The stents were postdilated with a 10 mm balloon with excellent angiographic completion result and no significant residual stenosis.  Based on the completion imaging, no further intervention is necessary.  The wire and balloon were removed from the sheath.  A 4-0 Monocryl purse-string suture was sewn around the sheath.  The sheath was removed while tying down the suture.  A sterile bandage was applied to the puncture site.  COMPLICATIONS: None  CONDITION: Stable   Jonathan Mcgrath  01/12/2016 2:09 PM

## 2016-01-12 NOTE — OR Nursing (Signed)
Upon arrival post procedure"+ bruit + thrill present. Site stable with bandaide on

## 2016-01-13 ENCOUNTER — Encounter: Payer: Self-pay | Admitting: Vascular Surgery

## 2016-08-12 ENCOUNTER — Emergency Department
Admission: EM | Admit: 2016-08-12 | Discharge: 2016-08-12 | Disposition: A | Discharge status: Home / Self Care | Payer: Medicare HMO | Attending: Emergency Medicine | Admitting: Emergency Medicine

## 2016-08-12 ENCOUNTER — Encounter: Payer: Self-pay | Admitting: *Deleted

## 2016-08-12 DIAGNOSIS — R0981 Nasal congestion: Secondary | ICD-10-CM | POA: Diagnosis present

## 2016-08-12 DIAGNOSIS — Z79899 Other long term (current) drug therapy: Secondary | ICD-10-CM | POA: Diagnosis not present

## 2016-08-12 DIAGNOSIS — J069 Acute upper respiratory infection, unspecified: Secondary | ICD-10-CM | POA: Diagnosis not present

## 2016-08-12 DIAGNOSIS — N189 Chronic kidney disease, unspecified: Secondary | ICD-10-CM | POA: Insufficient documentation

## 2016-08-12 DIAGNOSIS — I129 Hypertensive chronic kidney disease with stage 1 through stage 4 chronic kidney disease, or unspecified chronic kidney disease: Secondary | ICD-10-CM | POA: Insufficient documentation

## 2016-08-12 MED ORDER — BENZONATATE 200 MG PO CAPS: 200.0000 mg | ORAL_CAPSULE | Freq: Three times a day (TID) | ORAL | 0 refills | Status: DC | PRN

## 2016-08-12 MED ORDER — ONDANSETRON 4 MG PO TBDP: 4.0000 mg | ORAL_TABLET | Freq: Three times a day (TID) | ORAL | 0 refills | Status: DC | PRN

## 2016-08-12 MED ORDER — FLUTICASONE PROPIONATE 50 MCG/ACT NA SUSP: 1.0000 | Freq: Two times a day (BID) | NASAL | 0 refills | Status: DC

## 2016-08-12 NOTE — ED Provider Notes (Signed)
[facility] Emergency Department Provider Note  ____________________________________________  Time seen: Approximately 5:03 PM  I have reviewed the triage vital signs and the nursing notes.   HISTORY  Chief Complaint Cough and Nausea    HPI Jonathan Mcgrath is a 76 y.o. male who presents emergency department complaining of nasal congestion, scratchy throat, dry nonproductive cough, and nausea times one day. Patient reports that symptoms began gradually and are mild in nature. Patient does have a history of chronic any disease and is on dialysis. Patient states that he mentions symptoms yesterday to staff at dialysis and they were unconcerned.He did have extensive laboratory yesterday and states that all results were in the normal range. Patient states that he has had no headache, visual changes, neck stiffness, chest pain, shortness of breath, abdominal pain, vomiting, diarrhea, constipation. Patient reports that symptoms of nasal congestion, scratchy throat, cough are very mild in nature. He will experience mild nausea after a coughing spell.   Past Medical History:  Diagnosis Date  . Chronic kidney disease   . Hypertension     There are no active problems to display for this patient.   Past Surgical History:  Procedure Laterality Date  . AV FISTULA PLACEMENT Left 2015   arm  . PERIPHERAL VASCULAR CATHETERIZATION Left 01/12/2016   Procedure: A/V Shuntogram/Fistulagram;  Surgeon: Algernon Huxley, MD;  Location: [place];  Service: Cardiovascular;  Laterality: Left;  . PERIPHERAL VASCULAR CATHETERIZATION N/A 01/12/2016   Procedure: A/V Shunt Intervention;  Surgeon: Algernon Huxley, MD;  Location: [place] [facility];  Service: Cardiovascular;  Laterality: N/A;  . PERITONEAL CATHETER INSERTION N/A   . REMOVAL OF A DIALYSIS CATHETER N/A 01/14/2015   Procedure: REMOVAL OF A  PERITONEAL DIALYSIS CATHETER;  Surgeon: Algernon Huxley, MD;  Location: [facility];   Service: Vascular;  Laterality: N/A;    Prior to Admission medications   Medication Sig Start Date End Date Taking? Authorizing Provider  benzonatate (TESSALON) 200 MG capsule Take 1 capsule (200 mg total) by mouth 3 (three) times daily as needed for cough. 08/12/16   Charline Bills Cuthriell, PA-C  cinacalcet (SENSIPAR) 30 MG tablet Take 30 mg by mouth every morning.    Historical Provider, MD  fluticasone (FLONASE) 50 MCG/ACT nasal spray Place 1 spray into both nostrils 2 (two) times daily. 08/12/16   Charline Bills Cuthriell, PA-C  multivitamin (RENA-VIT) TABS tablet Take 1 tablet by mouth daily. 10/12/15   Historical Provider, MD  ondansetron (ZOFRAN-ODT) 4 MG disintegrating tablet Take 1 tablet (4 mg total) by mouth every 8 (eight) hours as needed for nausea or vomiting. 08/12/16   Charline Bills Cuthriell, PA-C  RENVELA 800 MG tablet Take 1,600 mg by mouth daily. 10/12/15   Historical Provider, MD    Allergies Patient has no known allergies.  History reviewed. No pertinent family history.  Social History Social History  Substance Use Topics  . Smoking status: Never Smoker  . Smokeless tobacco: Never Used  . Alcohol use No     Review of Systems  Constitutional: No fever/chills Eyes: No visual changes. No discharge ENT: Positive for nasal congestion and scratchy throat. Cardiovascular: no chest pain. Respiratory: Intermittent dry cough. No SOB. Gastrointestinal: No abdominal pain.  Intermittent nausea but no vomiting.  No diarrhea.  No constipation. Musculoskeletal: Negative for musculoskeletal pain. Skin: Negative for rash, abrasions, lacerations, ecchymosis. Neurological: Negative for headaches, focal weakness or numbness. 10-point ROS otherwise negative.  ____________________________________________   PHYSICAL EXAM:  VITAL SIGNS: ED Triage Vitals [08/12/16 1631]  Enc Vitals Group     BP (!) 139/58     Pulse Rate 79     Resp 18     Temp 99.9 F (37.7 C)     Temp Source Oral      SpO2 98 %     Weight 210 lb (95.3 kg)     Height 6\' 1"  (1.854 m)     Head Circumference      Peak Flow      Pain Score      Pain Loc      Pain Edu?      Excl. in [place]?      Constitutional: Alert and oriented. Well appearing and in no acute distress. Eyes: Conjunctivae are normal. PERRL. EOMI. Head: Atraumatic. ENT:      Ears: EACs and TMs are unremarkable bilaterally.      Nose: Mild clear congestion/rhinnorhea.      Mouth/Throat: Mucous membranes are moist. Her pharynx is nonerythematous and nonedematous. Uvula is midline.  Neck: No stridor. Neck is supple with full range of motion Hematological/Lymphatic/Immunilogical: Scattered, nontender, mobile, anterior cervical lymphadenopathy. Cardiovascular: Normal rate, regular rhythm. Normal S1 and S2.  Good peripheral circulation. Respiratory: Normal respiratory effort without tachypnea or retractions. Lungs CTAB. Good air entry to the bases with no decreased or absent breath sounds. Gastrointestinal: Bowel sounds 4 quadrants. Soft and nontender to palpation. No guarding or rigidity. No palpable masses. No distention. No CVA tenderness Musculoskeletal: Full range of motion to all extremities. No gross deformities appreciated. Neurologic:  Normal speech and language. No gross focal neurologic deficits are appreciated.  Skin:  Skin is warm, dry and intact. No rash noted. Psychiatric: Mood and affect are normal. Speech and behavior are normal. Patient exhibits appropriate insight and judgement.   ____________________________________________   LABS (all labs ordered are listed, but only abnormal results are displayed)  Labs Reviewed - No data to display ____________________________________________  EKG   ____________________________________________  RADIOLOGY   No results found.  ____________________________________________    PROCEDURES  Procedure(s) performed:    Procedures    Medications - No data to  display   ____________________________________________   INITIAL IMPRESSION / ASSESSMENT AND PLAN / ED COURSE  Pertinent labs & imaging results that were available during my care of the patient were reviewed by me and considered in my medical decision making (see chart for details).  Review of the [place] CSRS was performed in accordance of the [place] prior to dispensing any controlled drugs.     Patient's diagnosis is consistent with viral upper respiratory illness. Patient's symptoms are consistent with viral upper respiratory illness, however due to patient's medical history, I offered patient repeat labs, chest x-ray, influenza testing. Patient reports that at this time "if I could just get medications to help with the symptoms I'd be okay." Patient declines any further testing of this time. Again, symptoms are mild and are consistent with viral upper respiratory illness and at this time no imaging or labs will be ordered. Exam is reassuring.. Patient will be discharged home with prescriptions for symptom control. Patient is to follow up with nephrologist, primary care as needed or otherwise directed. Patient is given ED precautions to return to the ED for any worsening or new symptoms.     ____________________________________________  FINAL CLINICAL IMPRESSION(S) / ED DIAGNOSES  Final diagnoses:  Viral upper respiratory tract infection      NEW MEDICATIONS STARTED DURING THIS VISIT:  New  Prescriptions   BENZONATATE (TESSALON) 200 MG CAPSULE    Take 1 capsule (200 mg total) by mouth 3 (three) times daily as needed for cough.   FLUTICASONE (FLONASE) 50 MCG/ACT NASAL SPRAY    Place 1 spray into both nostrils 2 (two) times daily.   ONDANSETRON (ZOFRAN-ODT) 4 MG DISINTEGRATING TABLET    Take 1 tablet (4 mg total) by mouth every 8 (eight) hours as needed for nausea or vomiting.        This chart was dictated using voice recognition software/Dragon. Despite best efforts to proofread,  errors can occur which can change the meaning. Any change was purely unintentional.    Darletta Moll, PA-C 08/12/16 [place] Quigley, MD 08/12/16 [phone]

## 2016-08-12 NOTE — ED Notes (Signed)
Pt verbalized understanding of discharge instructions. NAD at this time. 

## 2016-08-12 NOTE — ED Triage Notes (Addendum)
PT arrived to ED reporting having a sore throat and non productive cough beginning yesterday and nausea beginning today. No vomiting or diarrhea reported. No SOB or abd pain reported. Pt in NAD at this time and verbalized, "I just want to be checked out"

## 2016-08-16 ENCOUNTER — Encounter: Payer: Self-pay | Admitting: Emergency Medicine

## 2016-08-16 ENCOUNTER — Inpatient Hospital Stay
Admission: EM | Admit: 2016-08-16 | Discharge: 2016-08-18 | DRG: 193 | Disposition: A | Discharge status: Home / Self Care | Payer: Medicare HMO | Attending: Internal Medicine | Admitting: Internal Medicine

## 2016-08-16 ENCOUNTER — Emergency Department: Payer: Medicare HMO

## 2016-08-16 DIAGNOSIS — E1122 Type 2 diabetes mellitus with diabetic chronic kidney disease: Secondary | ICD-10-CM | POA: Diagnosis present

## 2016-08-16 DIAGNOSIS — J159 Unspecified bacterial pneumonia: Secondary | ICD-10-CM | POA: Diagnosis not present

## 2016-08-16 DIAGNOSIS — Z8249 Family history of ischemic heart disease and other diseases of the circulatory system: Secondary | ICD-10-CM

## 2016-08-16 DIAGNOSIS — N186 End stage renal disease: Secondary | ICD-10-CM | POA: Diagnosis present

## 2016-08-16 DIAGNOSIS — J189 Pneumonia, unspecified organism: Secondary | ICD-10-CM

## 2016-08-16 DIAGNOSIS — K219 Gastro-esophageal reflux disease without esophagitis: Secondary | ICD-10-CM | POA: Diagnosis present

## 2016-08-16 DIAGNOSIS — Z833 Family history of diabetes mellitus: Secondary | ICD-10-CM

## 2016-08-16 DIAGNOSIS — R05 Cough: Secondary | ICD-10-CM | POA: Diagnosis not present

## 2016-08-16 DIAGNOSIS — D631 Anemia in chronic kidney disease: Secondary | ICD-10-CM | POA: Diagnosis present

## 2016-08-16 DIAGNOSIS — Z79899 Other long term (current) drug therapy: Secondary | ICD-10-CM

## 2016-08-16 DIAGNOSIS — I120 Hypertensive chronic kidney disease with stage 5 chronic kidney disease or end stage renal disease: Secondary | ICD-10-CM | POA: Diagnosis present

## 2016-08-16 DIAGNOSIS — Z992 Dependence on renal dialysis: Secondary | ICD-10-CM

## 2016-08-16 DIAGNOSIS — N2581 Secondary hyperparathyroidism of renal origin: Secondary | ICD-10-CM | POA: Diagnosis present

## 2016-08-16 HISTORY — DX: Dependence on renal dialysis: Z99.2

## 2016-08-16 LAB — COMPREHENSIVE METABOLIC PANEL
ALT: 20 U/L (ref 17–63)
AST: 28 U/L (ref 15–41)
Albumin: 3.3 g/dL — ABNORMAL LOW (ref 3.5–5.0)
Alkaline Phosphatase: 42 U/L (ref 38–126)
Anion gap: 10 (ref 5–15)
BUN: 25 mg/dL — ABNORMAL HIGH (ref 6–20)
CO2: 30 mmol/L (ref 22–32)
Calcium: 8.3 mg/dL — ABNORMAL LOW (ref 8.9–10.3)
Chloride: 98 mmol/L — ABNORMAL LOW (ref 101–111)
Creatinine, Ser: 6.75 mg/dL — ABNORMAL HIGH (ref 0.61–1.24)
GFR calc Af Amer: 8 mL/min — ABNORMAL LOW (ref >60)
GFR calc non Af Amer: 7 mL/min — ABNORMAL LOW (ref >60)
Glucose, Bld: 100 mg/dL — ABNORMAL HIGH (ref 65–99)
Potassium: 3.7 mmol/L (ref 3.5–5.1)
Sodium: 138 mmol/L (ref 135–145)
Total Bilirubin: 0.8 mg/dL (ref 0.3–1.2)
Total Protein: 6.1 g/dL — ABNORMAL LOW (ref 6.5–8.1)

## 2016-08-16 LAB — CBC WITH DIFFERENTIAL/PLATELET
Basophils Absolute: 0.1 10*3/uL (ref 0–0.1)
Basophils Relative: 1 %
Eosinophils Absolute: 0 10*3/uL (ref 0–0.7)
Eosinophils Relative: 0 %
HCT: 32.7 % — ABNORMAL LOW (ref 40.0–52.0)
Hemoglobin: 11 g/dL — ABNORMAL LOW (ref 13.0–18.0)
Lymphocytes Relative: 52 %
Lymphs Abs: 6.9 10*3/uL — ABNORMAL HIGH (ref 1.0–3.6)
MCH: 30.9 pg (ref 26.0–34.0)
MCHC: 33.7 g/dL (ref 32.0–36.0)
MCV: 91.7 fL (ref 80.0–100.0)
Monocytes Absolute: 0.8 10*3/uL (ref 0.2–1.0)
Monocytes Relative: 6 %
Neutro Abs: 5.5 10*3/uL (ref 1.4–6.5)
Neutrophils Relative %: 41 %
Platelets: 88 10*3/uL — ABNORMAL LOW (ref 150–440)
RBC: 3.57 MIL/uL — ABNORMAL LOW (ref 4.40–5.90)
RDW: 13.2 % (ref 11.5–14.5)
WBC: 13.3 10*3/uL — ABNORMAL HIGH (ref 3.8–10.6)

## 2016-08-16 LAB — INFLUENZA PANEL BY PCR (TYPE A & B)
Influenza A By PCR: NEGATIVE
Influenza B By PCR: NEGATIVE

## 2016-08-16 MED ORDER — IPRATROPIUM-ALBUTEROL 0.5-2.5 (3) MG/3ML IN SOLN
3.0000 mL | Freq: Once | RESPIRATORY_TRACT | Status: AC
  Administered 2016-08-17: 3 mL via RESPIRATORY_TRACT
  Filled 2016-08-16: qty 3

## 2016-08-16 NOTE — ED Notes (Addendum)
Patient reports that for a few days he has had chills and a "pins and needle pain" in his back. Patient is a dialysis patient. Patient also has a cough x 1 week worse with laying down. Also reports vomiting that started today. Reports 2 episodes today.  Patient had dialysis today and thought the chills was from the facility being so cold but he states he just can't get warm.

## 2016-08-16 NOTE — ED Notes (Signed)
ED Provider at bedside. 

## 2016-08-16 NOTE — ED Provider Notes (Signed)
[facility] Emergency Department Provider Note   First MD Initiated Contact with Patient 08/16/16 2256     (approximate)  I have reviewed the triage vital signs and the nursing notes.   HISTORY  Chief Complaint Chills; Back Pain; and Cough   HPI Jonathan Mcgrath is a 76 y.o. male with bullous chronic medical conditions presents to the emergency department with chills cough congestion pins and needles in his back progress over the course of the week. Patient also admits to one episode of nonbloody emesis today. Patient denies any abdominal pain along with the vomiting.   Past Medical History:  Diagnosis Date  . Chronic kidney disease   . Hemodialysis access site with arteriovenous graft ([place])   . Hypertension     There are no active problems to display for this patient.   Past Surgical History:  Procedure Laterality Date  . AV FISTULA PLACEMENT Left 2015   arm  . PERIPHERAL VASCULAR CATHETERIZATION Left 01/12/2016   Procedure: A/V Shuntogram/Fistulagram;  Surgeon: Algernon Huxley, MD;  Location: [place] [facility];  Service: Cardiovascular;  Laterality: Left;  . PERIPHERAL VASCULAR CATHETERIZATION N/A 01/12/2016   Procedure: A/V Shunt Intervention;  Surgeon: Algernon Huxley, MD;  Location: [place] [facility];  Service: Cardiovascular;  Laterality: N/A;  . PERITONEAL CATHETER INSERTION N/A   . REMOVAL OF A DIALYSIS CATHETER N/A 01/14/2015   Procedure: REMOVAL OF A  PERITONEAL DIALYSIS CATHETER;  Surgeon: Algernon Huxley, MD;  Location: [facility];  Service: Vascular;  Laterality: N/A;    Prior to Admission medications   Medication Sig Start Date End Date Taking? Authorizing Provider  benzonatate (TESSALON) 200 MG capsule Take 1 capsule (200 mg total) by mouth 3 (three) times daily as needed for cough. 08/12/16   Charline Bills Cuthriell, PA-C  cinacalcet (SENSIPAR) 30 MG tablet Take 30 mg by mouth every morning.    Historical Provider, MD  fluticasone (FLONASE)  50 MCG/ACT nasal spray Place 1 spray into both nostrils 2 (two) times daily. 08/12/16   Charline Bills Cuthriell, PA-C  multivitamin (RENA-VIT) TABS tablet Take 1 tablet by mouth daily. 10/12/15   Historical Provider, MD  ondansetron (ZOFRAN-ODT) 4 MG disintegrating tablet Take 1 tablet (4 mg total) by mouth every 8 (eight) hours as needed for nausea or vomiting. 08/12/16   Charline Bills Cuthriell, PA-C  RENVELA 800 MG tablet Take 1,600 mg by mouth daily. 10/12/15   Historical Provider, MD    Allergies Patient has no known allergies.  History reviewed. No pertinent family history.  Social History Social History  Substance Use Topics  . Smoking status: Never Smoker  . Smokeless tobacco: Never Used  . Alcohol use No    Review of Systems Constitutional: No fever/chills Eyes: No visual changes. ENT: No sore throat. Cardiovascular: Denies chest pain. Respiratory: Denies shortness of breath. Gastrointestinal: No abdominal pain.  No nausea, no vomiting.  No diarrhea.  No constipation. Genitourinary: Negative for dysuria. Musculoskeletal: Negative for back pain. Skin: Negative for rash. Neurological: Negative for headaches, focal weakness or numbness.  10-point ROS otherwise negative.  ____________________________________________   PHYSICAL EXAM:  VITAL SIGNS: ED Triage Vitals [08/16/16 1829]  Enc Vitals Group     BP (!) 131/55     Pulse Rate 86     Resp 18     Temp 99.1 F (37.3 C)     Temp Source Oral     SpO2 (!) 89 %     Weight  210 lb (95.3 kg)     Height 6\' 1"  (1.854 m)     Head Circumference      Peak Flow      Pain Score 2     Pain Loc      Pain Edu?      Excl. in [place]?     Constitutional: Alert and oriented. Well appearing and in no acute distress. Eyes: Conjunctivae are normal. PERRL. EOMI. Head: Atraumatic. Ears:  Healthy appearing ear canals and TMs bilaterally Nose: No congestion/rhinnorhea. Mouth/Throat: Mucous membranes are moist.  Oropharynx  non-erythematous. Neck: No stridor. Cardiovascular: Normal rate, regular rhythm. Good peripheral circulation. Grossly normal heart sounds. Respiratory: Normal respiratory effort.  No retractions. Bibasilar rhonchi Gastrointestinal: Soft and nontender. No distention.  Musculoskeletal: No lower extremity tenderness nor edema. No gross deformities of extremities. Neurologic:  Normal speech and language. No gross focal neurologic deficits are appreciated.  Skin:  Skin is warm, dry and intact. No rash noted. Psychiatric: Mood and affect are normal. Speech and behavior are normal.  ____________________________________________   LABS (all labs ordered are listed, but only abnormal results are displayed)  Labs Reviewed  CBC WITH DIFFERENTIAL/PLATELET - Abnormal; Notable for the following:       Result Value   WBC 13.3 (*)    RBC 3.57 (*)    Hemoglobin 11.0 (*)    HCT 32.7 (*)    Platelets 88 (*)    Lymphs Abs 6.9 (*)    All other components within normal limits  COMPREHENSIVE METABOLIC PANEL - Abnormal; Notable for the following:    Chloride 98 (*)    Glucose, Bld 100 (*)    BUN 25 (*)    Creatinine, Ser 6.75 (*)    Calcium 8.3 (*)    Total Protein 6.1 (*)    Albumin 3.3 (*)    GFR calc non Af Amer 7 (*)    GFR calc Af Amer 8 (*)    All other components within normal limits  INFLUENZA PANEL BY PCR (TYPE A & B)   ____________________________________________  EKG  ED ECG REPORT I, [place] N BROWN, the attending physician, personally viewed and interpreted this ECG.   Date: 08/16/2016  EKG Time: 6:45 PM  Rate: 84  Rhythm: Normal sinus rhythm  Axis: Normal  Intervals: Normal  ST&T Change: None  ____________________________________________  RADIOLOGY I, [place] N BROWN, personally viewed and evaluated these images (plain radiographs) as part of my medical decision making, as well as reviewing the written report by the radiologist.  Dg Chest 2 View  Result Date:  08/16/2016 CLINICAL DATA:  Sore throat and nonproductive cough onset yesterday. Nausea today. EXAM: CHEST  2 VIEW COMPARISON:  02/21/2015 FINDINGS: Minor linear lung base opacities likely represent minimal atelectasis, accentuated by a shallow degree of inspiration. The lungs are otherwise clear. There is no pleural effusion. The pulmonary vasculature is normal. Heart size is normal. Hilar and mediastinal contours are unremarkable and unchanged. IMPRESSION: Minimal linear atelectatic opacities in the lung bases, left greater than right. No consolidation or effusion. Electronically Signed   By: Andreas Newport M.D.   On: 08/16/2016 19:12     Procedures     INITIAL IMPRESSION / ASSESSMENT AND PLAN / ED COURSE  Pertinent labs & imaging results that were available during my care of the patient were reviewed by me and considered in my medical decision making (see chart for details).  76 year old male presenting with history of physical exam concerning for possible pneumonia.  Chest x-ray revealed bilateral lower lobe atelectatic opacities. Rhonchi noted in this area on exam. Patient is hypoxic with an oxygen saturation 89% on room air despite DuoNeb treatment in the emergency department. Patient will receive Levaquin 500 milligrams. Patient discussed with Dr.pyreddy or hospital admission for immediate quadrant pneumonia with hypoxia      ____________________________________________  FINAL CLINICAL IMPRESSION(S) / ED DIAGNOSES  Final diagnoses:  Community acquired pneumonia, unspecified laterality     MEDICATIONS GIVEN DURING THIS VISIT:  Medications  ipratropium-albuterol (DUONEB) 0.5-2.5 (3) MG/3ML nebulizer solution 3 mL (not administered)     NEW OUTPATIENT MEDICATIONS STARTED DURING THIS VISIT:  New Prescriptions   No medications on file    Modified Medications   No medications on file    Discontinued Medications   No medications on file     Note:  This document was  prepared using Dragon voice recognition software and may include unintentional dictation errors.    Gregor Hams, MD 08/17/16 [phone]

## 2016-08-16 NOTE — ED Notes (Signed)
Pt placed on 2L via [place], O2 sats 93%.

## 2016-08-17 ENCOUNTER — Encounter: Payer: Self-pay | Admitting: Internal Medicine

## 2016-08-17 DIAGNOSIS — E1122 Type 2 diabetes mellitus with diabetic chronic kidney disease: Secondary | ICD-10-CM | POA: Diagnosis present

## 2016-08-17 DIAGNOSIS — Z79899 Other long term (current) drug therapy: Secondary | ICD-10-CM | POA: Diagnosis not present

## 2016-08-17 DIAGNOSIS — K219 Gastro-esophageal reflux disease without esophagitis: Secondary | ICD-10-CM | POA: Diagnosis present

## 2016-08-17 DIAGNOSIS — J189 Pneumonia, unspecified organism: Secondary | ICD-10-CM | POA: Diagnosis present

## 2016-08-17 DIAGNOSIS — R05 Cough: Secondary | ICD-10-CM | POA: Diagnosis present

## 2016-08-17 DIAGNOSIS — D631 Anemia in chronic kidney disease: Secondary | ICD-10-CM | POA: Diagnosis present

## 2016-08-17 DIAGNOSIS — I120 Hypertensive chronic kidney disease with stage 5 chronic kidney disease or end stage renal disease: Secondary | ICD-10-CM | POA: Diagnosis present

## 2016-08-17 DIAGNOSIS — J159 Unspecified bacterial pneumonia: Secondary | ICD-10-CM | POA: Diagnosis present

## 2016-08-17 DIAGNOSIS — Z833 Family history of diabetes mellitus: Secondary | ICD-10-CM | POA: Diagnosis not present

## 2016-08-17 DIAGNOSIS — N186 End stage renal disease: Secondary | ICD-10-CM | POA: Diagnosis present

## 2016-08-17 DIAGNOSIS — Z992 Dependence on renal dialysis: Secondary | ICD-10-CM | POA: Diagnosis not present

## 2016-08-17 DIAGNOSIS — N2581 Secondary hyperparathyroidism of renal origin: Secondary | ICD-10-CM | POA: Diagnosis present

## 2016-08-17 DIAGNOSIS — Z8249 Family history of ischemic heart disease and other diseases of the circulatory system: Secondary | ICD-10-CM | POA: Diagnosis not present

## 2016-08-17 MED ORDER — SODIUM CHLORIDE 0.9% FLUSH: 3.0000 mL | INTRAVENOUS | Status: DC | PRN

## 2016-08-17 MED ORDER — ACETAMINOPHEN 325 MG PO TABS: 650.0000 mg | ORAL_TABLET | Freq: Four times a day (QID) | ORAL | Status: DC | PRN

## 2016-08-17 MED ORDER — SODIUM CHLORIDE 0.9 % IV SOLN: 250.0000 mL | INTRAVENOUS | Status: DC | PRN

## 2016-08-17 MED ORDER — DEXTROSE 5 % IV SOLN
500.0000 mg | INTRAVENOUS | Status: DC
  Administered 2016-08-17: 500 mg via INTRAVENOUS
  Filled 2016-08-17 (×2): qty 500

## 2016-08-17 MED ORDER — LEVOFLOXACIN IN D5W 500 MG/100ML IV SOLN
500.0000 mg | Freq: Once | INTRAVENOUS | Status: AC
  Administered 2016-08-17: 500 mg via INTRAVENOUS
  Filled 2016-08-17: qty 100

## 2016-08-17 MED ORDER — HEPARIN SODIUM (PORCINE) 5000 UNIT/ML IJ SOLN
5000.0000 [IU] | Freq: Three times a day (TID) | INTRAMUSCULAR | Status: DC
  Administered 2016-08-17 – 2016-08-18 (×4): 5000 [IU] via SUBCUTANEOUS
  Filled 2016-08-17 (×4): qty 1

## 2016-08-17 MED ORDER — ONDANSETRON HCL 4 MG/2ML IJ SOLN
4.0000 mg | Freq: Four times a day (QID) | INTRAMUSCULAR | Status: DC | PRN
  Administered 2016-08-18: 4 mg via INTRAVENOUS
  Filled 2016-08-17: qty 2

## 2016-08-17 MED ORDER — ENOXAPARIN SODIUM 100 MG/ML ~~LOC~~ SOLN
SUBCUTANEOUS | Status: AC
  Filled 2016-08-17: qty 1

## 2016-08-17 MED ORDER — SEVELAMER CARBONATE 800 MG PO TABS
1600.0000 mg | ORAL_TABLET | Freq: Every day | ORAL | Status: DC
  Administered 2016-08-18: 1600 mg via ORAL
  Filled 2016-08-17: qty 2

## 2016-08-17 MED ORDER — SEVELAMER CARBONATE 800 MG PO TABS: 1600.0000 mg | ORAL_TABLET | Freq: Every day | ORAL | Status: DC

## 2016-08-17 MED ORDER — ACETAMINOPHEN 650 MG RE SUPP: 650.0000 mg | Freq: Four times a day (QID) | RECTAL | Status: DC | PRN

## 2016-08-17 MED ORDER — ONDANSETRON HCL 4 MG PO TABS
4.0000 mg | ORAL_TABLET | Freq: Four times a day (QID) | ORAL | Status: DC | PRN
  Administered 2016-08-18: 4 mg via ORAL
  Filled 2016-08-17: qty 1

## 2016-08-17 MED ORDER — CEFTRIAXONE SODIUM-DEXTROSE 2-2.22 GM-% IV SOLR
2.0000 g | INTRAVENOUS | Status: DC
  Administered 2016-08-17: 2 g via INTRAVENOUS
  Filled 2016-08-17 (×3): qty 50

## 2016-08-17 MED ORDER — SODIUM CHLORIDE 0.9% FLUSH
3.0000 mL | Freq: Two times a day (BID) | INTRAVENOUS | Status: DC
  Administered 2016-08-17 – 2016-08-18 (×3): 3 mL via INTRAVENOUS

## 2016-08-17 MED ORDER — CINACALCET HCL 30 MG PO TABS
30.0000 mg | ORAL_TABLET | Freq: Every day | ORAL | Status: DC
  Administered 2016-08-17 – 2016-08-18 (×2): 30 mg via ORAL
  Filled 2016-08-17 (×2): qty 1

## 2016-08-17 NOTE — Progress Notes (Signed)
Pharmacy Antibiotic Note  Jonathan Mcgrath is a 76 y.o. male admitted on 08/16/2016 with CAP.  Pharmacy has been consulted for ceftriaxone dosing.  Plan: Ceftriaxone 2 gm IV Q24H  Height: 6\' 1"  (185.4 cm) Weight: 210 lb (95.3 kg) IBW/kg (Calculated) : 79.9  Temp (24hrs), Avg:98.8 F (37.1 C), Min:98.5 F (36.9 C), Max:99.1 F (37.3 C)   Recent Labs Lab 08/16/16 1848  WBC 13.3*  CREATININE 6.75*    Estimated Creatinine Clearance: 10.5 mL/min (by C-G formula based on SCr of 6.75 mg/dL (H)).    No Known Allergies  Thank you for allowing pharmacy to be a part of this patient's care.  Laural Benes, Pharm.D., BCPS Clinical Pharmacist 08/17/2016 3:07 AM

## 2016-08-17 NOTE — Progress Notes (Signed)
[facility] NAME: Jonathan Mcgrath    MR#:  [id number]  DATE OF BIRTH:  10-01-1940  SUBJECTIVE:  CHIEF COMPLAINT:  Patient is reporting cough and congestion  REVIEW OF SYSTEMS:  CONSTITUTIONAL: No fever, fatigue or weakness.  EYES: No blurred or double vision.  EARS, NOSE, AND THROAT: No tinnitus or ear pain.  RESPIRATORY: Reports some cough, shortness of breath, denies wheezing or hemoptysis.  CARDIOVASCULAR: No chest pain, orthopnea, edema.  GASTROINTESTINAL: No nausea, vomiting, diarrhea or abdominal pain.  GENITOURINARY: No dysuria, hematuria.  ENDOCRINE: No polyuria, nocturia,  HEMATOLOGY: No anemia, easy bruising or bleeding SKIN: No rash or lesion. MUSCULOSKELETAL: No joint pain or arthritis.   NEUROLOGIC: No tingling, numbness, weakness.  PSYCHIATRY: No anxiety or depression.   DRUG ALLERGIES:  No Known Allergies  VITALS:  Blood pressure (!) 150/69, pulse 66, temperature 98.2 F (36.8 C), temperature source Oral, resp. rate 18, height 6\' 1"  (1.854 m), weight 95.3 kg (210 lb), SpO2 94 %.  PHYSICAL EXAMINATION:  GENERAL:  76 y.o.-year-old patient lying in the bed with no acute distress.  EYES: Pupils equal, round, reactive to light and accommodation. No scleral icterus. Extraocular muscles intact.  HEENT: Head atraumatic, normocephalic. Oropharynx and nasopharynx clear.  NECK:  Supple, no jugular venous distention. No thyroid enlargement, no tenderness.  LUNGS: Mod breath sounds bilaterally, no wheezing, rales,rhonchi or crepitation. No use of accessory muscles of respiration.  CARDIOVASCULAR: S1, S2 normal. No murmurs, rubs, or gallops.  ABDOMEN: Soft, nontender, nondistended. Bowel sounds present. No organomegaly or mass.  EXTREMITIES: No pedal edema, cyanosis, or clubbing.  NEUROLOGIC: Cranial nerves II through XII are intact. Muscle strength 5/5 in all extremities. Sensation intact. Gait not checked.   PSYCHIATRIC: The patient is alert and oriented x 3.  SKIN: No obvious rash, lesion, or ulcer.    LABORATORY PANEL:   CBC  Recent Labs Lab 08/16/16 1848  WBC 13.3*  HGB 11.0*  HCT 32.7*  PLT 88*   ------------------------------------------------------------------------------------------------------------------  Chemistries   Recent Labs Lab 08/16/16 1848  NA 138  K 3.7  CL 98*  CO2 30  GLUCOSE 100*  BUN 25*  CREATININE 6.75*  CALCIUM 8.3*  AST 28  ALT 20  ALKPHOS 42  BILITOT 0.8   ------------------------------------------------------------------------------------------------------------------  Cardiac Enzymes No results for input(s): TROPONINI in the last 168 hours. ------------------------------------------------------------------------------------------------------------------  RADIOLOGY:  Dg Chest 2 View  Result Date: 08/16/2016 CLINICAL DATA:  Sore throat and nonproductive cough onset yesterday. Nausea today. EXAM: CHEST  2 VIEW COMPARISON:  02/21/2015 FINDINGS: Minor linear lung base opacities likely represent minimal atelectasis, accentuated by a shallow degree of inspiration. The lungs are otherwise clear. There is no pleural effusion. The pulmonary vasculature is normal. Heart size is normal. Hilar and mediastinal contours are unremarkable and unchanged. IMPRESSION: Minimal linear atelectatic opacities in the lung bases, left greater than right. No consolidation or effusion. Electronically Signed   By: Andreas Newport M.D.   On: 08/16/2016 19:12    EKG:   Orders placed or performed during the hospital encounter of 08/16/16  . ED EKG  . ED EKG  . EKG 12-Lead  . EKG 12-Lead    ASSESSMENT AND PLAN:    #Community acquired pneumonia Clinically improving. Continue IV Rocephin and azithromycin. Flu test is negative  #End stage renal disease on hemodialysis on Monday, Wednesday and Friday. Follow-up by nephrology. Anticipating dialysis in a.m.  Tomorrow  #Essential hypertension-blood pressure elevated Continue home  medications and titrate as needed  #Leukocytosis from pneumonia monitor closely    All the records are reviewed and case discussed with Care Management/Social Workerr. Management plans discussed with the patient, family and they are in agreement.  CODE STATUS: fc   TOTAL TIME TAKING CARE OF THIS PATIENT: 45  minutes.   POSSIBLE D/C IN am DAYS, DEPENDING ON CLINICAL CONDITION.  Note: This dictation was prepared with Dragon dictation along with smaller phrase technology. Any transcriptional errors that result from this process are unintentional.   Nicholes Mango M.D on 08/17/2016 at 3:48 PM  Between 7am to 6pm - Pager - [phone] After 6pm go to [email or web address] - password [email or web address] [facility]  Office  [phone]  CC: Primary care physician; [name], MD

## 2016-08-17 NOTE — ED Notes (Signed)
Transporting patient to room 203-2C with EDT Judson Roch

## 2016-08-17 NOTE — ED Notes (Signed)
Patient given meal tray and ginger ale

## 2016-08-17 NOTE — Progress Notes (Signed)
[facility]  ROUNDING NOTE   Subjective:  Patient well known to [place] as an outpatient. He presents now with pneumonia. He's had a cough at home that has been rather nonproductive. He denies having fevers. He did go to dialysis yesterday.   Objective:  Vital signs in last 24 hours:  Temp:  [98.1 F (36.7 C)-99.1 F (37.3 C)] 98.2 F (36.8 C) (02/01 0806) Pulse Rate:  [64-86] 66 (02/01 0806) Resp:  [18] 18 (02/01 0806) BP: (131-168)/(55-97) 150/69 (02/01 0806) SpO2:  [89 %-97 %] 94 % (02/01 0806) Weight:  [95.3 kg (210 lb)] 95.3 kg (210 lb) (01/31 1829)  Weight change:  Filed Weights   08/16/16 1829  Weight: 95.3 kg (210 lb)    Intake/Output: I/O last 3 completed shifts: In: 100 [IV Piggyback:100] Out: -    Intake/Output this shift:  Total I/O In: 120 [P.O.:120] Out: 200 [Urine:200]  Physical Exam: General: No acute distress  Head: Normocephalic, atraumatic. Moist oral mucosal membranes  Eyes: Anicteric  Neck: Supple, trachea midline  Lungs:  Bibasilar rales, normal effort   Heart: S1S2 no rubs  Abdomen:  Soft, nontender, Bowel sounds present   Extremities: No peripheral edema.  Neurologic: Nonfocal, moving all four extremities  Skin: No lesions  Access: LUE AVF    Basic Metabolic Panel:  Recent Labs Lab 08/16/16 1848  NA 138  K 3.7  CL 98*  CO2 30  GLUCOSE 100*  BUN 25*  CREATININE 6.75*  CALCIUM 8.3*    Liver Function Tests:  Recent Labs Lab 08/16/16 1848  AST 28  ALT 20  ALKPHOS 42  BILITOT 0.8  PROT 6.1*  ALBUMIN 3.3*   No results for input(s): LIPASE, AMYLASE in the last 168 hours. No results for input(s): AMMONIA in the last 168 hours.  CBC:  Recent Labs Lab 08/16/16 1848  WBC 13.3*  NEUTROABS 5.5  HGB 11.0*  HCT 32.7*  MCV 91.7  PLT 88*    Cardiac Enzymes: No results for input(s): CKTOTAL, CKMB, CKMBINDEX, TROPONINI in the last 168 hours.  BNP: Invalid input(s): POCBNP  CBG: No results for  input(s): GLUCAP in the last 168 hours.  Microbiology: Results for orders placed or performed in visit on 04/11/14  Stool culture     Status: None   Collection Time: 04/12/14 12:49 PM  Result Value Ref Range Status   Micro Text Report   Final       COMMENT                   NO SALMONELLA OR SHIGELLA ISOLATED   COMMENT                   NO PATHOGENIC E.COLI DETECTED   COMMENT                   NO CAMPYLOBACTER ANTIGEN DETECTED   ANTIBIOTIC                                                      Clostridium Difficile [facility])     Status: None   Collection Time: 04/12/14 12:49 PM  Result Value Ref Range Status   Micro Text Report   Final       C.DIFFICILE ANTIGEN       C.DIFFICILE GDH ANTIGEN : NEGATIVE  C.DIFFICILE TOXIN A/B     C.DIFFICILE TOXINS A AND B : NEGATIVE   INTERPRETATION            Negative for C. difficile.    ANTIBIOTIC                                                        Coagulation Studies: No results for input(s): LABPROT, INR in the last 72 hours.  Urinalysis: No results for input(s): COLORURINE, LABSPEC, PHURINE, GLUCOSEU, HGBUR, BILIRUBINUR, KETONESUR, PROTEINUR, UROBILINOGEN, NITRITE, LEUKOCYTESUR in the last 72 hours.  Invalid input(s): APPERANCEUR    Imaging: Dg Chest 2 View  Result Date: 08/16/2016 CLINICAL DATA:  Sore throat and nonproductive cough onset yesterday. Nausea today. EXAM: CHEST  2 VIEW COMPARISON:  02/21/2015 FINDINGS: Minor linear lung base opacities likely represent minimal atelectasis, accentuated by a shallow degree of inspiration. The lungs are otherwise clear. There is no pleural effusion. The pulmonary vasculature is normal. Heart size is normal. Hilar and mediastinal contours are unremarkable and unchanged. IMPRESSION: Minimal linear atelectatic opacities in the lung bases, left greater than right. No consolidation or effusion. Electronically Signed   By: Andreas Newport M.D.   On: 08/16/2016 19:12     Medications:    .  azithromycin  500 mg Intravenous Q24H  . cefTRIAXone  2 g Intravenous Q24H  . cinacalcet  30 mg Oral Q breakfast  . enoxaparin      . heparin  5,000 Units Subcutaneous Q8H  . sevelamer carbonate  1,600 mg Oral Daily  . sodium chloride flush  3 mL Intravenous Q12H   sodium chloride, acetaminophen **OR** acetaminophen, ondansetron **OR** ondansetron (ZOFRAN) IV, sodium chloride flush  Assessment/ Plan:  76 y.o. male with past medical history of ESRD, hypertension, diabetes mellitus, GERD, lower extremity edema, anemia of CKD, SHPTH, transitioned off PD to HD 6/16.  [facility] [place]/MWF  1. ESRD on HD MWF.  Patient completed hemodialysis yesterday. No acute indication for dialysis today. We will plan for dialysis again tomorrow.  2. Anemia of chronic kidney disease. Hemoglobin currently 11.0 and at target. Hold off on Epogen for now.  3. Secondary hyperparathyroidism. Patient will be maintained on Renvela as well as Sensipar 30 mg by mouth daily. Continue to monitor bone mineral metabolism parameters as an inpatient.  4. Bacterial pneumonia. Patient has been started on ceftriaxone as well as azithromycin. Continue to monitor his respiratory status.     LOS: 0  Brinsley Wence 2/1/201811:13 AM

## 2016-08-17 NOTE — H&P (Addendum)
[facility] [place] NAME: Jonathan Mcgrath    MR#:  [id number]  DATE OF BIRTH:  10/23/1940  DATE OF ADMISSION:  08/16/2016  PRIMARY CARE PHYSICIAN: [name], MD   REQUESTING/REFERRING PHYSICIAN:   CHIEF COMPLAINT:   Chief Complaint  Patient presents with  . Chills  . Back Pain  . Cough    HISTORY OF PRESENT ILLNESS: Camillo Quadros  is a 76 y.o. male with a known history of End-stage renal disease on dialysis, hypertension presented to the emergency room with cough and low-grade fever. Patient had cough productive relation phlegm since yesterday morning. He also had generalized body aches and chills. Patient gets dialyzed on Monday Wednesday and Friday and follows up with nephrology in the community clinic. No complaints of any chest pain. Patient was evaluated in the emergency room was found to have pneumonia and was started on IV antibiotics. No history of any recent travel. No history of any fall or head injury. Hospitalist service was consulted for further care of the patient.  PAST MEDICAL HISTORY:   Past Medical History:  Diagnosis Date  . Chronic kidney disease   . Hemodialysis access site with arteriovenous graft ([place])   . Hypertension     PAST SURGICAL HISTORY: Past Surgical History:  Procedure Laterality Date  . AV FISTULA PLACEMENT Left 2015   arm  . PERIPHERAL VASCULAR CATHETERIZATION Left 01/12/2016   Procedure: A/V Shuntogram/Fistulagram;  Surgeon: Algernon Huxley, MD;  Location: [facility];  Service: Cardiovascular;  Laterality: Left;  . PERIPHERAL VASCULAR CATHETERIZATION N/A 01/12/2016   Procedure: A/V Shunt Intervention;  Surgeon: Algernon Huxley, MD;  Location: [facility];  Service: Cardiovascular;  Laterality: N/A;  . PERITONEAL CATHETER INSERTION N/A   . REMOVAL OF A DIALYSIS CATHETER N/A 01/14/2015   Procedure: REMOVAL OF A  PERITONEAL DIALYSIS CATHETER;  Surgeon: Algernon Huxley, MD;  Location:  [facility];  Service: Vascular;  Laterality: N/A;    SOCIAL HISTORY:  Social History  Substance Use Topics  . Smoking status: Never Smoker  . Smokeless tobacco: Never Used  . Alcohol use No    FAMILY HISTORY:  Family History  Problem Relation Age of Onset  . Diabetes Mother   . Heart disease Father     DRUG ALLERGIES: No Known Allergies  REVIEW OF SYSTEMS:   CONSTITUTIONAL: Has fever,  weakness.  EYES: No blurred or double vision.  EARS, NOSE, AND THROAT: No tinnitus or ear pain.  RESPIRATORY: Has cough, shortness of breath  No wheezing or hemoptysis.  CARDIOVASCULAR: No chest pain, orthopnea, edema.  GASTROINTESTINAL: No nausea, vomiting, diarrhea or abdominal pain.  GENITOURINARY: No dysuria, hematuria.  ENDOCRINE: No polyuria, nocturia,  HEMATOLOGY: No anemia, easy bruising or bleeding SKIN: No rash or lesion. MUSCULOSKELETAL: No joint pain or arthritis.   NEUROLOGIC: No tingling, numbness, weakness.  PSYCHIATRY: No anxiety or depression.   MEDICATIONS AT HOME:  Prior to Admission medications   Medication Sig Start Date End Date Taking? Authorizing Provider  benzonatate (TESSALON) 200 MG capsule Take 1 capsule (200 mg total) by mouth 3 (three) times daily as needed for cough. 08/12/16  Yes Roderic Palau D Cuthriell, PA-C  cinacalcet (SENSIPAR) 30 MG tablet Take 30 mg by mouth every morning.   Yes Historical Provider, MD  multivitamin (RENA-VIT) TABS tablet Take 1 tablet by mouth daily. 10/12/15  Yes Historical Provider, MD  ondansetron (ZOFRAN-ODT) 4 MG disintegrating tablet Take 1 tablet (4 mg  total) by mouth every 8 (eight) hours as needed for nausea or vomiting. 08/12/16   Charline Bills Cuthriell, PA-C  RENVELA 800 MG tablet Take 1,600 mg by mouth daily. 10/12/15   Historical Provider, MD      PHYSICAL EXAMINATION:   VITAL SIGNS: Blood pressure 139/62, pulse 68, temperature 98.5 F (36.9 C), temperature source Oral, resp. rate 18, height 6\' 1"  (1.854 m), weight 95.3 kg (210  lb), SpO2 96 %.  GENERAL:  76 y.o.-year-old patient lying in the bed with no acute distress.  EYES: Pupils equal, round, reactive to light and accommodation. No scleral icterus. Extraocular muscles intact.  HEENT: Head atraumatic, normocephalic. Oropharynx dry and nasopharynx clear.  NECK:  Supple, no jugular venous distention. No thyroid enlargement, no tenderness.  LUNGS: Decreased breath sounds bilaterally, no wheezing, rales heard in left lung . No use of accessory muscles of respiration.  CARDIOVASCULAR: S1, S2 normal. No murmurs, rubs, or gallops.  ABDOMEN: Soft, nontender, nondistended. Bowel sounds present. No organomegaly or mass.  EXTREMITIES: No pedal edema, cyanosis, or clubbing.  NEUROLOGIC: Cranial nerves II through XII are intact. Muscle strength 5/5 in all extremities. Sensation intact. Gait not checked.  PSYCHIATRIC: The patient is alert and oriented x 3.  SKIN: No obvious rash, lesion, or ulcer.   LABORATORY PANEL:   CBC  Recent Labs Lab 08/16/16 1848  WBC 13.3*  HGB 11.0*  HCT 32.7*  PLT 88*  MCV 91.7  MCH 30.9  MCHC 33.7  RDW 13.2  LYMPHSABS 6.9*  MONOABS 0.8  EOSABS 0.0  BASOSABS 0.1   ------------------------------------------------------------------------------------------------------------------  Chemistries   Recent Labs Lab 08/16/16 1848  NA 138  K 3.7  CL 98*  CO2 30  GLUCOSE 100*  BUN 25*  CREATININE 6.75*  CALCIUM 8.3*  AST 28  ALT 20  ALKPHOS 42  BILITOT 0.8   ------------------------------------------------------------------------------------------------------------------ estimated creatinine clearance is 10.5 mL/min (by C-G formula based on SCr of 6.75 mg/dL (H)). ------------------------------------------------------------------------------------------------------------------ No results for input(s): TSH, T4TOTAL, T3FREE, THYROIDAB in the last 72 hours.  Invalid input(s): FREET3   Coagulation profile No results for  input(s): INR, PROTIME in the last 168 hours. ------------------------------------------------------------------------------------------------------------------- No results for input(s): DDIMER in the last 72 hours. -------------------------------------------------------------------------------------------------------------------  Cardiac Enzymes No results for input(s): CKMB, TROPONINI, MYOGLOBIN in the last 168 hours.  Invalid input(s): CK ------------------------------------------------------------------------------------------------------------------ Invalid input(s): POCBNP  ---------------------------------------------------------------------------------------------------------------  Urinalysis    Component Value Date/Time   COLORURINE Yellow 04/11/2014 2115   APPEARANCEUR Clear 04/11/2014 2115   LABSPEC 1.012 04/11/2014 2115   PHURINE 7.0 04/11/2014 2115   GLUCOSEU 150 mg/dL 04/11/2014 2115   HGBUR 2+ 04/11/2014 2115   BILIRUBINUR Negative 04/11/2014 2115   KETONESUR Negative 04/11/2014 2115   PROTEINUR >=500 04/11/2014 2115   NITRITE Negative 04/11/2014 2115   LEUKOCYTESUR Negative 04/11/2014 2115     RADIOLOGY: Dg Chest 2 View  Result Date: 08/16/2016 CLINICAL DATA:  Sore throat and nonproductive cough onset yesterday. Nausea today. EXAM: CHEST  2 VIEW COMPARISON:  02/21/2015 FINDINGS: Minor linear lung base opacities likely represent minimal atelectasis, accentuated by a shallow degree of inspiration. The lungs are otherwise clear. There is no pleural effusion. The pulmonary vasculature is normal. Heart size is normal. Hilar and mediastinal contours are unremarkable and unchanged. IMPRESSION: Minimal linear atelectatic opacities in the lung bases, left greater than right. No consolidation or effusion. Electronically Signed   By: Andreas Newport M.D.   On: 08/16/2016 19:12    EKG: Orders placed or performed during the  hospital encounter of 08/16/16  . ED EKG  . ED  EKG  . EKG 12-Lead  . EKG 12-Lead    IMPRESSION AND PLAN: 76 year old male patient with history of hypertension, end-stage renal disease on dialysis presented to the emergency room with low-grade fever, chills and cough. Admitting diagnosis 1. Community-acquired pneumonia 2. Leukocytosis secondary to pneumonia 3. End-stage renal disease on dialysis 4. Hypertension Treatment plan Admit patient to medical floor Start patient on IV Rocephin and Zithromax IV antibiotics Follow-up WBC count Nephrology consultation for dialysis Heparin subcutaneously for DVT prophylaxis Supportive care. All the records are reviewed and case discussed with ED provider. Management plans discussed with the patient, family and they are in agreement.  CODE STATUS:FULL CODE Surrogate decision maker : Daughter Code Status History    Date Active Date Inactive Code Status Order ID Comments User Context   01/12/2016  2:17 PM 01/12/2016  5:54 PM Full Code [id number]  Algernon Huxley, MD Inpatient       TOTAL TIME TAKING CARE OF THIS PATIENT: 54 minutes.    Saundra Shelling M.D on 08/17/2016 at 3:07 AM  Between 7am to 6pm - Pager - [phone]  After 6pm go to [email or web address] - password [email or web address] [facility]  Office  [phone]  CC: Primary care physician; [name], MD

## 2016-08-18 LAB — CBC
HCT: 30.1 % — ABNORMAL LOW (ref 40.0–52.0)
Hemoglobin: 10.1 g/dL — ABNORMAL LOW (ref 13.0–18.0)
MCH: 31 pg (ref 26.0–34.0)
MCHC: 33.7 g/dL (ref 32.0–36.0)
MCV: 92.2 fL (ref 80.0–100.0)
Platelets: 94 10*3/uL — ABNORMAL LOW (ref 150–440)
RBC: 3.27 MIL/uL — ABNORMAL LOW (ref 4.40–5.90)
RDW: 13.2 % (ref 11.5–14.5)
WBC: 12.7 10*3/uL — ABNORMAL HIGH (ref 3.8–10.6)

## 2016-08-18 LAB — PHOSPHORUS: Phosphorus: 3.1 mg/dL (ref 2.5–4.6)

## 2016-08-18 MED ORDER — ALBUTEROL SULFATE HFA 108 (90 BASE) MCG/ACT IN AERS: 2.0000 | INHALATION_SPRAY | Freq: Four times a day (QID) | RESPIRATORY_TRACT | 2 refills | Status: DC | PRN

## 2016-08-18 MED ORDER — AMOXICILLIN-POT CLAVULANATE 875-125 MG PO TABS: 1.0000 | ORAL_TABLET | Freq: Two times a day (BID) | ORAL | 0 refills | Status: DC

## 2016-08-18 MED ORDER — SODIUM CHLORIDE 0.9 % IV SOLN: 100.0000 mL | INTRAVENOUS | Status: DC | PRN

## 2016-08-18 MED ORDER — SACCHAROMYCES BOULARDII 250 MG PO CAPS: 250.0000 mg | ORAL_CAPSULE | Freq: Two times a day (BID) | ORAL | 0 refills | Status: DC

## 2016-08-18 MED ORDER — AZITHROMYCIN 250 MG PO TABS: ORAL_TABLET | ORAL | 0 refills | Status: DC

## 2016-08-18 MED ORDER — ACETAMINOPHEN 325 MG PO TABS: 650.0000 mg | ORAL_TABLET | Freq: Four times a day (QID) | ORAL | Status: DC | PRN

## 2016-08-18 MED ORDER — PENTAFLUOROPROP-TETRAFLUOROETH EX AERO: 1.0000 "application " | INHALATION_SPRAY | CUTANEOUS | Status: DC | PRN

## 2016-08-18 MED ORDER — LIDOCAINE-PRILOCAINE 2.5-2.5 % EX CREA: 1.0000 "application " | TOPICAL_CREAM | CUTANEOUS | Status: DC | PRN

## 2016-08-18 MED ORDER — ALTEPLASE 2 MG IJ SOLR: 2.0000 mg | Freq: Once | INTRAMUSCULAR | Status: DC | PRN

## 2016-08-18 MED ORDER — HEPARIN SODIUM (PORCINE) 1000 UNIT/ML DIALYSIS: 1000.0000 [IU] | INTRAMUSCULAR | Status: DC | PRN

## 2016-08-18 MED ORDER — LIDOCAINE HCL (PF) 1 % IJ SOLN: 5.0000 mL | INTRAMUSCULAR | Status: DC | PRN

## 2016-08-18 NOTE — Progress Notes (Signed)
HD STARTED  

## 2016-08-18 NOTE — Progress Notes (Signed)
Patient discharged to home, Presciptions given to patient as ordered. Vitals signs stable follow up apointments given as ordered.

## 2016-08-18 NOTE — Progress Notes (Signed)
PRE DIALYSIS ASSESSMENT 

## 2016-08-18 NOTE — Progress Notes (Signed)
HD COMPLETED  

## 2016-08-18 NOTE — Discharge Summary (Signed)
[place] [facility] [place] NAME: Jonathan Mcgrath    MR#:  [id number]  DATE OF BIRTH:  1941-01-09  DATE OF ADMISSION:  08/16/2016 ADMITTING PHYSICIAN: Saundra Shelling, MD  DATE OF DISCHARGE: 08/18/16 PRIMARY CARE PHYSICIAN: [name], MD    ADMISSION DIAGNOSIS:  Community acquired pneumonia, unspecified laterality [J18.9]  DISCHARGE DIAGNOSIS:  Principal Problem:   CAP (community acquired pneumonia)  ESRD -HD - M/W/F SECONDARY DIAGNOSIS:   Past Medical History:  Diagnosis Date  . Chronic kidney disease   . Hemodialysis access site with arteriovenous graft ([place])   . Hypertension     HOSPITAL COURSE:  HISTORY OF PRESENT ILLNESS: Jonathan Mcgrath  is a 76 y.o. male with a known history of End-stage renal disease on dialysis, hypertension presented to the emergency room with cough and low-grade fever. Patient had cough productive relation phlegm since yesterday morning. He also had generalized body aches and chills. Patient gets dialyzed on Monday Wednesday and Friday and follows up with nephrology in the community clinic. No complaints of any chest pain. Patient was evaluated in the emergency room was found to have pneumonia and was started on IV antibiotics. No history of any recent travel. No history of any fall or head injury. Hospitalist service was consulted for further care of the patient.  Hospital course   #Community acquired pneumonia Clinically improving. Improving with IV Rocephin and azithromycin.Marland Kitchen Discharge patient home with by mouth Augmentin and azithromycin after hemodialysis today Flu test is negative  #End stage renal disease on hemodialysis on Monday, Wednesday and Friday. Follow-up by nephrology. Hemodialysis today  #Essential hypertension-blood pressure elevated Continue home medications and titrate as needed  #Leukocytosis from pneumonia monitor closely, trending down     DISCHARGE CONDITIONS:  STABLE    CONSULTS OBTAINED:  Treatment Team:  Munsoor Holley Raring, MD   PROCEDURES NONE   DRUG ALLERGIES:  No Known Allergies  DISCHARGE MEDICATIONS:   Current Discharge Medication List    START taking these medications   Details  acetaminophen (TYLENOL) 325 MG tablet Take 2 tablets (650 mg total) by mouth every 6 (six) hours as needed for mild pain (or Fever >/= 101).    albuterol (PROVENTIL HFA;VENTOLIN HFA) 108 (90 Base) MCG/ACT inhaler Inhale 2 puffs into the lungs every 6 (six) hours as needed for wheezing or shortness of breath. Qty: 1 Inhaler, Refills: 2    amoxicillin-clavulanate (AUGMENTIN) 875-125 MG tablet Take 1 tablet by mouth 2 (two) times daily. Qty: 14 tablet, Refills: 0    azithromycin (ZITHROMAX) 250 MG tablet Take one tab po once daily Qty: 4 each, Refills: 0    saccharomyces boulardii (FLORASTOR) 250 MG capsule Take 1 capsule (250 mg total) by mouth 2 (two) times daily. Qty: 30 capsule, Refills: 0      CONTINUE these medications which have NOT CHANGED   Details  benzonatate (TESSALON) 200 MG capsule Take 1 capsule (200 mg total) by mouth 3 (three) times daily as needed for cough. Qty: 30 capsule, Refills: 0    cinacalcet (SENSIPAR) 30 MG tablet Take 30 mg by mouth every morning.    multivitamin (RENA-VIT) TABS tablet Take 1 tablet by mouth daily. Refills: 11    ondansetron (ZOFRAN-ODT) 4 MG disintegrating tablet Take 1 tablet (4 mg total) by mouth every 8 (eight) hours as needed for nausea or vomiting. Qty: 20 tablet, Refills: 0    RENVELA 800 MG tablet Take 1,600 mg by mouth daily. Refills: 12  DISCHARGE INSTRUCTIONS:   Follow-up with primary care physician in a week Continue hemodialysis on Monday, Wednesday and Friday Follow-up with nephrology in 2-3 days  DIET:  Renal diet  DISCHARGE CONDITION:  Stable  ACTIVITY:  Activity as tolerated  OXYGEN:  Home Oxygen: No.   Oxygen Delivery: room air  DISCHARGE LOCATION:  home   If  you experience worsening of your admission symptoms, develop shortness of breath, life threatening emergency, suicidal or homicidal thoughts you must seek medical attention immediately by calling 911 or calling your MD immediately  if symptoms less severe.  You Must read complete instructions/literature along with all the possible adverse reactions/side effects for all the Medicines you take and that have been prescribed to you. Take any new Medicines after you have completely understood and accpet all the possible adverse reactions/side effects.   Please note  You were cared for by a hospitalist during your hospital stay. If you have any questions about your discharge medications or the care you received while you were in the hospital after you are discharged, you can call the unit and asked to speak with the hospitalist on call if the hospitalist that took care of you is not available. Once you are discharged, your primary care physician will handle any further medical issues. Please note that NO REFILLS for any discharge medications will be authorized once you are discharged, as it is imperative that you return to your primary care physician (or establish a relationship with a primary care physician if you do not have one) for your aftercare needs so that they can reassess your need for medications and monitor your lab values.     Today  Chief Complaint  Patient presents with  . Chills  . Back Pain  . Cough   Patient is feeling much better. Shortness of breath resolved. Ambulating in the hallway without any difficulty Wants to go home after dialysis today  ROS: CONSTITUTIONAL: Denies fevers, chills. Denies any fatigue, weakness.  EYES: Denies blurry vision, double vision, eye pain. EARS, NOSE, THROAT: Denies tinnitus, ear pain, hearing loss. RESPIRATORY: Denies cough, wheeze, shortness of breath.  CARDIOVASCULAR: Denies chest pain, palpitations, edema.  GASTROINTESTINAL: Denies nausea,  vomiting, diarrhea, abdominal pain. Denies bright red blood per rectum. GENITOURINARY: Denies dysuria, hematuria. ENDOCRINE: Denies nocturia or thyroid problems. HEMATOLOGIC AND LYMPHATIC: Denies easy bruising or bleeding. SKIN: Denies rash or lesion. MUSCULOSKELETAL: Denies pain in neck, back, shoulder, knees, hips or arthritic symptoms.  NEUROLOGIC: Denies paralysis, paresthesias.  PSYCHIATRIC: Denies anxiety or depressive symptoms.   VITAL SIGNS:  Blood pressure (!) 147/72, pulse 65, temperature 98.1 F (36.7 C), temperature source Oral, resp. rate 18, height 6\' 1"  (1.854 m), weight 95.3 kg (210 lb), SpO2 100 %.  I/O:    Intake/Output Summary (Last 24 hours) at 08/18/16 1051 Last data filed at 08/18/16 0947  Gross per 24 hour  Intake              830 ml  Output              250 ml  Net              580 ml    PHYSICAL EXAMINATION:  GENERAL:  76 y.o.-year-old patient lying in the bed with no acute distress.  EYES: Pupils equal, round, reactive to light and accommodation. No scleral icterus. Extraocular muscles intact.  HEENT: Head atraumatic, normocephalic. Oropharynx and nasopharynx clear.  NECK:  Supple, no jugular venous distention. No thyroid enlargement, no  tenderness.  LUNGS: nml breath sounds bilaterally, no wheezing, rales,rhonchi or crepitation. No use of accessory muscles of respiration.  CARDIOVASCULAR: S1, S2 normal. No murmurs, rubs, or gallops.  ABDOMEN: Soft, non-tender, non-distended. Bowel sounds present. No organomegaly or mass.  EXTREMITIES: No pedal edema, cyanosis, or clubbing.  NEUROLOGIC: Cranial nerves II through XII are intact. Muscle strength 5/5 in all extremities. Sensation intact. Gait not checked.  PSYCHIATRIC: The patient is alert and oriented x 3.  SKIN: No obvious rash, lesion, or ulcer.   DATA REVIEW:   CBC  Recent Labs Lab 08/18/16 0414  WBC 12.7*  HGB 10.1*  HCT 30.1*  PLT 94*    Chemistries   Recent Labs Lab 08/16/16 1848   NA 138  K 3.7  CL 98*  CO2 30  GLUCOSE 100*  BUN 25*  CREATININE 6.75*  CALCIUM 8.3*  AST 28  ALT 20  ALKPHOS 42  BILITOT 0.8    Cardiac Enzymes No results for input(s): TROPONINI in the last 168 hours.  Microbiology Results  Results for orders placed or performed in visit on 04/11/14  Stool culture     Status: None   Collection Time: 04/12/14 12:49 PM  Result Value Ref Range Status   Micro Text Report   Final       COMMENT                   NO SALMONELLA OR SHIGELLA ISOLATED   COMMENT                   NO PATHOGENIC E.COLI DETECTED   COMMENT                   NO CAMPYLOBACTER ANTIGEN DETECTED   ANTIBIOTIC                                                      Clostridium Difficile [facility])     Status: None   Collection Time: 04/12/14 12:49 PM  Result Value Ref Range Status   Micro Text Report   Final       C.DIFFICILE ANTIGEN       C.DIFFICILE GDH ANTIGEN : NEGATIVE   C.DIFFICILE TOXIN A/B     C.DIFFICILE TOXINS A AND B : NEGATIVE   INTERPRETATION            Negative for C. difficile.    ANTIBIOTIC                                                        RADIOLOGY:  Dg Chest 2 View  Result Date: 08/16/2016 CLINICAL DATA:  Sore throat and nonproductive cough onset yesterday. Nausea today. EXAM: CHEST  2 VIEW COMPARISON:  02/21/2015 FINDINGS: Minor linear lung base opacities likely represent minimal atelectasis, accentuated by a shallow degree of inspiration. The lungs are otherwise clear. There is no pleural effusion. The pulmonary vasculature is normal. Heart size is normal. Hilar and mediastinal contours are unremarkable and unchanged. IMPRESSION: Minimal linear atelectatic opacities in the lung bases, left greater than right. No consolidation or effusion. Electronically Signed   By: Andreas Newport M.D.   On:  08/16/2016 19:12    EKG:   Orders placed or performed during the hospital encounter of 08/16/16  . ED EKG  . ED EKG  . EKG 12-Lead  . EKG 12-Lead       Management plans discussed with the patient, family and they are in agreement.  CODE STATUS:     Code Status Orders        Start     Ordered   08/17/16 0353  Full code  Continuous     08/17/16 0352    Code Status History    Date Active Date Inactive Code Status Order ID Comments User Context   01/12/2016  2:17 PM 01/12/2016  5:54 PM Full Code [id number]  Algernon Huxley, MD Inpatient      TOTAL TIME TAKING CARE OF THIS PATIENT: 45  minutes.   Note: This dictation was prepared with Dragon dictation along with smaller phrase technology. Any transcriptional errors that result from this process are unintentional.   @[facility] @  on 08/18/2016 at 10:51 AM  Between 7am to 6pm - Pager - [phone]  After 6pm go to [email or web address] - password [email or web address] [facility]  Office  [phone]  CC: Primary care physician; [name], MD

## 2016-08-18 NOTE — Care Management (Signed)
HD info faxed to [facility] HD liaison

## 2016-08-18 NOTE — Progress Notes (Signed)
Patient off floor to dialysis, patient is alert and oriented, no acute distress noted.

## 2016-08-18 NOTE — Progress Notes (Signed)
POST DIALYSIS ASSESSMENT 

## 2016-08-18 NOTE — Care Management (Signed)
[facility] notified of discharge

## 2016-08-18 NOTE — Progress Notes (Signed)
[facility]  ROUNDING NOTE   Subjective:  Patient overall feeling better. He is due for hemodialysis today. Patient's son at bedside.   Objective:  Vital signs in last 24 hours:  Temp:  [98 F (36.7 C)-98.3 F (36.8 C)] 98.1 F (36.7 C) (02/02 0835) Pulse Rate:  [65-70] 65 (02/02 0835) Resp:  [16-18] 18 (02/02 0835) BP: (147-149)/(62-72) 147/72 (02/02 0835) SpO2:  [92 %-100 %] 100 % (02/02 0835)  Weight change:  Filed Weights   08/16/16 1829  Weight: 95.3 kg (210 lb)    Intake/Output: I/O last 3 completed shifts: In: 930 [P.O.:480; IV Piggyback:450] Out: 450 [Urine:450]   Intake/Output this shift:  Total I/O In: 120 [P.O.:120] Out: -   Physical Exam: General: No acute distress  Head: Normocephalic, atraumatic. Moist oral mucosal membranes  Eyes: Anicteric  Neck: Supple, trachea midline  Lungs:  Bibasilar rales, normal effort   Heart: S1S2 no rubs  Abdomen:  Soft, nontender, Bowel sounds present   Extremities: No peripheral edema.  Neurologic: Nonfocal, moving all four extremities  Skin: No lesions  Access: LUE AVF    Basic Metabolic Panel:  Recent Labs Lab 08/16/16 1848  NA 138  K 3.7  CL 98*  CO2 30  GLUCOSE 100*  BUN 25*  CREATININE 6.75*  CALCIUM 8.3*    Liver Function Tests:  Recent Labs Lab 08/16/16 1848  AST 28  ALT 20  ALKPHOS 42  BILITOT 0.8  PROT 6.1*  ALBUMIN 3.3*   No results for input(s): LIPASE, AMYLASE in the last 168 hours. No results for input(s): AMMONIA in the last 168 hours.  CBC:  Recent Labs Lab 08/16/16 1848 08/18/16 0414  WBC 13.3* 12.7*  NEUTROABS 5.5  --   HGB 11.0* 10.1*  HCT 32.7* 30.1*  MCV 91.7 92.2  PLT 88* 94*    Cardiac Enzymes: No results for input(s): CKTOTAL, CKMB, CKMBINDEX, TROPONINI in the last 168 hours.  BNP: Invalid input(s): POCBNP  CBG: No results for input(s): GLUCAP in the last 168 hours.  Microbiology: Results for orders placed or performed in visit on  04/11/14  Stool culture     Status: None   Collection Time: 04/12/14 12:49 PM  Result Value Ref Range Status   Micro Text Report   Final       COMMENT                   NO SALMONELLA OR SHIGELLA ISOLATED   COMMENT                   NO PATHOGENIC E.COLI DETECTED   COMMENT                   NO CAMPYLOBACTER ANTIGEN DETECTED   ANTIBIOTIC                                                      Clostridium Difficile [facility])     Status: None   Collection Time: 04/12/14 12:49 PM  Result Value Ref Range Status   Micro Text Report   Final       C.DIFFICILE ANTIGEN       C.DIFFICILE GDH ANTIGEN : NEGATIVE   C.DIFFICILE TOXIN A/B     C.DIFFICILE TOXINS A AND B : NEGATIVE   INTERPRETATION  Negative for C. difficile.    ANTIBIOTIC                                                        Coagulation Studies: No results for input(s): LABPROT, INR in the last 72 hours.  Urinalysis: No results for input(s): COLORURINE, LABSPEC, PHURINE, GLUCOSEU, HGBUR, BILIRUBINUR, KETONESUR, PROTEINUR, UROBILINOGEN, NITRITE, LEUKOCYTESUR in the last 72 hours.  Invalid input(s): APPERANCEUR    Imaging: Dg Chest 2 View  Result Date: 08/16/2016 CLINICAL DATA:  Sore throat and nonproductive cough onset yesterday. Nausea today. EXAM: CHEST  2 VIEW COMPARISON:  02/21/2015 FINDINGS: Minor linear lung base opacities likely represent minimal atelectasis, accentuated by a shallow degree of inspiration. The lungs are otherwise clear. There is no pleural effusion. The pulmonary vasculature is normal. Heart size is normal. Hilar and mediastinal contours are unremarkable and unchanged. IMPRESSION: Minimal linear atelectatic opacities in the lung bases, left greater than right. No consolidation or effusion. Electronically Signed   By: Andreas Newport M.D.   On: 08/16/2016 19:12     Medications:    . azithromycin  500 mg Intravenous Q24H  . cefTRIAXone  2 g Intravenous Q24H  . cinacalcet  30 mg Oral Q  breakfast  . heparin  5,000 Units Subcutaneous Q8H  . sevelamer carbonate  1,600 mg Oral Q breakfast  . sodium chloride flush  3 mL Intravenous Q12H   sodium chloride, sodium chloride, sodium chloride, acetaminophen **OR** acetaminophen, alteplase, heparin, lidocaine (PF), lidocaine-prilocaine, ondansetron **OR** ondansetron (ZOFRAN) IV, pentafluoroprop-tetrafluoroeth, sodium chloride flush  Assessment/ Plan:  76 y.o. male with past medical history of ESRD, hypertension, diabetes mellitus, GERD, lower extremity edema, anemia of CKD, SHPTH, transitioned off PD to HD 6/16.  [facility]/MWF  1. ESRD on HD MWF.  Patient due for hemodialysis today per his normal schedule. Orders have been prepared.  2. Anemia of chronic kidney disease. Hemoglobin currently 10.1. Likely dilutional. Continue to monitor as an outpatient.  3. Secondary hyperparathyroidism. Continue Renvela and Sensipar. Measure serum phosphorus with dialysis today.  4. Bacterial pneumonia. Patient currently maintained on ceftriaxone and azithromycin. Defer outpatient antibiotic selection to hospitalist.     LOS: 1  Murphy Bundick 2/2/201811:33 AM

## 2016-08-19 LAB — PARATHYROID HORMONE, INTACT (NO CA): PTH: 141 pg/mL — ABNORMAL HIGH (ref 15–65)

## 2016-08-31 ENCOUNTER — Other Ambulatory Visit (INDEPENDENT_AMBULATORY_CARE_PROVIDER_SITE_OTHER): Payer: Self-pay | Admitting: Vascular Surgery

## 2016-08-31 ENCOUNTER — Ambulatory Visit (INDEPENDENT_AMBULATORY_CARE_PROVIDER_SITE_OTHER): Payer: Self-pay | Admitting: Vascular Surgery

## 2016-08-31 ENCOUNTER — Ambulatory Visit (INDEPENDENT_AMBULATORY_CARE_PROVIDER_SITE_OTHER): Payer: Medicare HMO

## 2016-08-31 DIAGNOSIS — Z992 Dependence on renal dialysis: Secondary | ICD-10-CM | POA: Diagnosis not present

## 2016-08-31 DIAGNOSIS — N186 End stage renal disease: Secondary | ICD-10-CM

## 2016-08-31 DIAGNOSIS — T829XXA Unspecified complication of cardiac and vascular prosthetic device, implant and graft, initial encounter: Secondary | ICD-10-CM

## 2017-04-30 DIAGNOSIS — D689 Coagulation defect, unspecified: Secondary | ICD-10-CM | POA: Insufficient documentation

## 2017-04-30 DIAGNOSIS — Z992 Dependence on renal dialysis: Secondary | ICD-10-CM | POA: Insufficient documentation

## 2017-04-30 DIAGNOSIS — N189 Chronic kidney disease, unspecified: Secondary | ICD-10-CM | POA: Insufficient documentation

## 2017-04-30 DIAGNOSIS — D631 Anemia in chronic kidney disease: Secondary | ICD-10-CM | POA: Insufficient documentation

## 2017-07-17 DIAGNOSIS — C801 Malignant (primary) neoplasm, unspecified: Secondary | ICD-10-CM

## 2017-07-17 HISTORY — DX: Malignant (primary) neoplasm, unspecified: C80.1

## 2017-08-17 HISTORY — PX: CATARACT EXTRACTION W/ INTRAOCULAR LENS  IMPLANT, BILATERAL: SHX1307

## 2017-08-27 ENCOUNTER — Ambulatory Visit: Payer: Medicare HMO | Admitting: Physical Therapy

## 2017-09-10 DIAGNOSIS — Z111 Encounter for screening for respiratory tuberculosis: Secondary | ICD-10-CM | POA: Insufficient documentation

## 2017-09-24 ENCOUNTER — Emergency Department
Admission: EM | Admit: 2017-09-24 | Discharge: 2017-09-24 | Disposition: A | Discharge status: Home / Self Care | Payer: Medicare HMO | Attending: Emergency Medicine | Admitting: Emergency Medicine

## 2017-09-24 ENCOUNTER — Other Ambulatory Visit: Payer: Self-pay

## 2017-09-24 ENCOUNTER — Emergency Department: Payer: Medicare HMO

## 2017-09-24 DIAGNOSIS — I129 Hypertensive chronic kidney disease with stage 1 through stage 4 chronic kidney disease, or unspecified chronic kidney disease: Secondary | ICD-10-CM | POA: Diagnosis not present

## 2017-09-24 DIAGNOSIS — R05 Cough: Secondary | ICD-10-CM | POA: Diagnosis not present

## 2017-09-24 DIAGNOSIS — N189 Chronic kidney disease, unspecified: Secondary | ICD-10-CM | POA: Diagnosis not present

## 2017-09-24 DIAGNOSIS — R112 Nausea with vomiting, unspecified: Secondary | ICD-10-CM | POA: Diagnosis present

## 2017-09-24 DIAGNOSIS — J09X9 Influenza due to identified novel influenza A virus with other manifestations: Secondary | ICD-10-CM | POA: Insufficient documentation

## 2017-09-24 DIAGNOSIS — Z79899 Other long term (current) drug therapy: Secondary | ICD-10-CM | POA: Diagnosis not present

## 2017-09-24 DIAGNOSIS — R197 Diarrhea, unspecified: Secondary | ICD-10-CM | POA: Insufficient documentation

## 2017-09-24 DIAGNOSIS — Z992 Dependence on renal dialysis: Secondary | ICD-10-CM | POA: Insufficient documentation

## 2017-09-24 DIAGNOSIS — R059 Cough, unspecified: Secondary | ICD-10-CM

## 2017-09-24 DIAGNOSIS — J101 Influenza due to other identified influenza virus with other respiratory manifestations: Secondary | ICD-10-CM

## 2017-09-24 LAB — LIPASE, BLOOD: Lipase: 33 U/L (ref 11–51)

## 2017-09-24 LAB — CBC
HCT: 33.4 % — ABNORMAL LOW (ref 40.0–52.0)
Hemoglobin: 11 g/dL — ABNORMAL LOW (ref 13.0–18.0)
MCH: 30.5 pg (ref 26.0–34.0)
MCHC: 33 g/dL (ref 32.0–36.0)
MCV: 92.3 fL (ref 80.0–100.0)
Platelets: 111 10*3/uL — ABNORMAL LOW (ref 150–440)
RBC: 3.61 MIL/uL — ABNORMAL LOW (ref 4.40–5.90)
RDW: 14.1 % (ref 11.5–14.5)
WBC: 14.1 10*3/uL — ABNORMAL HIGH (ref 3.8–10.6)

## 2017-09-24 LAB — COMPREHENSIVE METABOLIC PANEL
ALT: 19 U/L (ref 17–63)
AST: 28 U/L (ref 15–41)
Albumin: 4.1 g/dL (ref 3.5–5.0)
Alkaline Phosphatase: 135 U/L — ABNORMAL HIGH (ref 38–126)
Anion gap: 16 — ABNORMAL HIGH (ref 5–15)
BUN: 48 mg/dL — ABNORMAL HIGH (ref 6–20)
CO2: 25 mmol/L (ref 22–32)
Calcium: 8 mg/dL — ABNORMAL LOW (ref 8.9–10.3)
Chloride: 101 mmol/L (ref 101–111)
Creatinine, Ser: 9.71 mg/dL — ABNORMAL HIGH (ref 0.61–1.24)
GFR calc Af Amer: 5 mL/min — ABNORMAL LOW (ref >60)
GFR calc non Af Amer: 5 mL/min — ABNORMAL LOW (ref >60)
Glucose, Bld: 123 mg/dL — ABNORMAL HIGH (ref 65–99)
Potassium: 4 mmol/L (ref 3.5–5.1)
Sodium: 142 mmol/L (ref 135–145)
Total Bilirubin: 0.9 mg/dL (ref 0.3–1.2)
Total Protein: 6.9 g/dL (ref 6.5–8.1)

## 2017-09-24 LAB — TROPONIN I
Troponin I: 0.08 ng/mL (ref <0.03)
Troponin I: 0.09 ng/mL (ref <0.03)

## 2017-09-24 LAB — INFLUENZA PANEL BY PCR (TYPE A & B)
Influenza A By PCR: POSITIVE — AB
Influenza B By PCR: NEGATIVE

## 2017-09-24 MED ORDER — METOCLOPRAMIDE HCL 5 MG/ML IJ SOLN
INTRAMUSCULAR | Status: AC
  Administered 2017-09-24: 10 mg
  Filled 2017-09-24: qty 2

## 2017-09-24 MED ORDER — SODIUM CHLORIDE 0.9 % IV BOLUS (SEPSIS)
500.0000 mL | Freq: Once | INTRAVENOUS | Status: AC
  Administered 2017-09-24: 500 mL via INTRAVENOUS

## 2017-09-24 MED ORDER — ONDANSETRON HCL 4 MG/2ML IJ SOLN
4.0000 mg | Freq: Once | INTRAMUSCULAR | Status: AC
  Administered 2017-09-24: 4 mg via INTRAVENOUS
  Filled 2017-09-24: qty 2

## 2017-09-24 MED ORDER — METOCLOPRAMIDE HCL 10 MG PO TABS: 10.0000 mg | ORAL_TABLET | Freq: Three times a day (TID) | ORAL | 0 refills | Status: DC | PRN

## 2017-09-24 MED ORDER — OSELTAMIVIR PHOSPHATE 30 MG PO CAPS: 30.0000 mg | ORAL_CAPSULE | Freq: Every day | ORAL | 0 refills | Status: AC

## 2017-09-24 MED ORDER — OSELTAMIVIR PHOSPHATE 30 MG PO CAPS
30.0000 mg | ORAL_CAPSULE | Freq: Once | ORAL | Status: AC
  Administered 2017-09-24: 30 mg via ORAL
  Filled 2017-09-24: qty 1

## 2017-09-24 NOTE — ED Notes (Signed)
Pt up to bathroom and back to bed without assistance. Daughter at bedside.

## 2017-09-24 NOTE — ED Notes (Signed)
Dialysis on Monday, Wednesday, and Friday in [place]. Patient presents with vomiting and chills. Presents with son.

## 2017-09-24 NOTE — ED Notes (Signed)
Pharm called for med 

## 2017-09-24 NOTE — ED Provider Notes (Signed)
Second troponin not significantly changed, appropriate for discharge at this point   Lavonia Drafts, MD 09/24/17 [phone]

## 2017-09-24 NOTE — ED Notes (Signed)
Patient transported to X-ray 

## 2017-09-24 NOTE — ED Triage Notes (Signed)
Patient reports having vomiting, cough and chills.

## 2017-09-24 NOTE — ED Provider Notes (Signed)
[facility] Emergency Department Provider Note   ____________________________________________   First MD Initiated Contact with Patient 09/24/17 [phone]     (approximate)  I have reviewed the triage vital signs and the nursing notes.   HISTORY  Chief Complaint Emesis and Cough    HPI Jonathan Mcgrath is a 77 y.o. male who comes into the hospital today with some vomiting and chills.  The patient states that it started on Saturday morning around 2 AM.  He has vomited at least 12 times.  He states that his emesis is dark in appearance and has been whatever food he tried to eat.  The patient denies any abdominal pain or fevers but he has had some chills.  The patient states that he has had a little bit of diarrhea.  He has had no known sick contacts.  He is also had a cough which started as well.  The patient denies any shortness of breath chest pain dizziness or lightheadedness.  The patient is here today for evaluation and treatment of his symptoms.  Past Medical History:  Diagnosis Date  . Chronic kidney disease   . Hemodialysis access site with arteriovenous graft ([place])   . Hypertension     Patient Active Problem List   Diagnosis Date Noted  . CAP (community acquired pneumonia) 08/17/2016    Past Surgical History:  Procedure Laterality Date  . AV FISTULA PLACEMENT Left 2015   arm  . PERIPHERAL VASCULAR CATHETERIZATION Left 01/12/2016   Procedure: A/V Shuntogram/Fistulagram;  Surgeon: Algernon Huxley, MD;  Location: [place] [facility];  Service: Cardiovascular;  Laterality: Left;  . PERIPHERAL VASCULAR CATHETERIZATION N/A 01/12/2016   Procedure: A/V Shunt Intervention;  Surgeon: Algernon Huxley, MD;  Location: [place] [facility];  Service: Cardiovascular;  Laterality: N/A;  . PERITONEAL CATHETER INSERTION N/A   . REMOVAL OF A DIALYSIS CATHETER N/A 01/14/2015   Procedure: REMOVAL OF A  PERITONEAL DIALYSIS CATHETER;  Surgeon: Algernon Huxley, MD;  Location: [facility]  ORS;  Service: Vascular;  Laterality: N/A;    Prior to Admission medications   Medication Sig Start Date End Date Taking? Authorizing Provider  acetaminophen (TYLENOL) 325 MG tablet Take 2 tablets (650 mg total) by mouth every 6 (six) hours as needed for mild pain (or Fever >/= 101). 08/18/16   Gouru, Illene Silver, MD  albuterol (PROVENTIL HFA;VENTOLIN HFA) 108 (90 Base) MCG/ACT inhaler Inhale 2 puffs into the lungs every 6 (six) hours as needed for wheezing or shortness of breath. 08/18/16   Nicholes Mango, MD  amoxicillin-clavulanate (AUGMENTIN) 875-125 MG tablet Take 1 tablet by mouth 2 (two) times daily. 08/18/16   Nicholes Mango, MD  azithromycin (ZITHROMAX) 250 MG tablet Take one tab po once daily 08/18/16   Gouru, Aruna, MD  benzonatate (TESSALON) 200 MG capsule Take 1 capsule (200 mg total) by mouth 3 (three) times daily as needed for cough. 08/12/16   Cuthriell, Charline Bills, PA-C  cinacalcet (SENSIPAR) 30 MG tablet Take 30 mg by mouth every morning.    [provider]  metoCLOPramide (REGLAN) 10 MG tablet Take 1 tablet (10 mg total) by mouth every 8 (eight) hours as needed. 09/24/17   Loney Hering, MD  multivitamin (RENA-VIT) TABS tablet Take 1 tablet by mouth daily. 10/12/15   [provider]  ondansetron (ZOFRAN-ODT) 4 MG disintegrating tablet Take 1 tablet (4 mg total) by mouth every 8 (eight) hours as needed for nausea or vomiting. 08/12/16   Cuthriell, Roderic Palau  D, PA-C  oseltamivir (TAMIFLU) 30 MG capsule Take 1 capsule (30 mg total) by mouth daily for 5 days. 09/24/17 09/29/17  Loney Hering, MD  RENVELA 800 MG tablet Take 1,600 mg by mouth daily. 10/12/15   [provider]  saccharomyces boulardii (FLORASTOR) 250 MG capsule Take 1 capsule (250 mg total) by mouth 2 (two) times daily. 08/18/16   Nicholes Mango, MD    Allergies Patient has no known allergies.  Family History  Problem Relation Age of Onset  . Diabetes Mother   . Heart disease Father     Social  History Social History   Tobacco Use  . Smoking status: Never Smoker  . Smokeless tobacco: Never Used  Substance Use Topics  . Alcohol use: No  . Drug use: No    Review of Systems  Constitutional: chills Eyes: No visual changes. ENT: No sore throat. Cardiovascular: Denies chest pain. Respiratory: Denies shortness of breath. Gastrointestinal: Nausea vomiting and diarrhea with no abdominal pain.  No constipation. Genitourinary: Negative for dysuria. Musculoskeletal: Negative for back pain. Skin: Negative for rash. Neurological: Negative for headaches, focal weakness or numbness.   ____________________________________________   PHYSICAL EXAM:  VITAL SIGNS: ED Triage Vitals  Enc Vitals Group     BP 09/24/17 0511 (!) 169/75     Pulse Rate 09/24/17 0511 95     Resp 09/24/17 0511 20     Temp 09/24/17 0511 98.7 F (37.1 C)     Temp Source 09/24/17 0511 Oral     SpO2 09/24/17 0511 98 %     Weight 09/24/17 0509 210 lb (95.3 kg)     Height 09/24/17 0509 6\' 1"  (1.854 m)     Head Circumference --      Peak Flow --      Pain Score --      Pain Loc --      Pain Edu? --      Excl. in [place]? --     Constitutional: Alert and oriented. Well appearing and in mild distress. Eyes: Conjunctivae are normal. PERRL. EOMI. Head: Atraumatic. Nose: No congestion/rhinnorhea. Mouth/Throat: Mucous membranes are moist.  Oropharynx non-erythematous. Cardiovascular: Normal rate, regular rhythm.  Systolic murmur good peripheral circulation. Respiratory: Normal respiratory effort.  No retractions. Lungs CTAB. Gastrointestinal: Soft and nontender. No distention.  Positive bowel sounds Musculoskeletal: No lower extremity tenderness nor edema.   Neurologic:  Normal speech and language.  Skin:  Skin is warm, dry and intact.  Psychiatric: Mood and affect are normal.   ____________________________________________   LABS (all labs ordered are listed, but only abnormal results are  displayed)  Labs Reviewed  COMPREHENSIVE METABOLIC PANEL - Abnormal; Notable for the following components:      Result Value   Glucose, Bld 123 (*)    BUN 48 (*)    Creatinine, Ser 9.71 (*)    Calcium 8.0 (*)    Alkaline Phosphatase 135 (*)    GFR calc non Af Amer 5 (*)    GFR calc Af Amer 5 (*)    Anion gap 16 (*)    All other components within normal limits  CBC - Abnormal; Notable for the following components:   WBC 14.1 (*)    RBC 3.61 (*)    Hemoglobin 11.0 (*)    HCT 33.4 (*)    Platelets 111 (*)    All other components within normal limits  INFLUENZA PANEL BY PCR (TYPE A & B) - Abnormal; Notable for the following components:  Influenza A By PCR POSITIVE (*)    All other components within normal limits  TROPONIN I - Abnormal; Notable for the following components:   Troponin I 0.08 (*)    All other components within normal limits  LIPASE, BLOOD  TROPONIN I   ____________________________________________  EKG  none ____________________________________________  RADIOLOGY  ED MD interpretation:  CXR: Cardiomegaly and pulmonary vascular congestion, no focal consolidation.  Official radiology report(s): Dg Chest 2 View  Result Date: 09/24/2017 CLINICAL DATA:  77 y/o M; vomiting dark fluid, cough, and chills for 24 hours. EXAM: CHEST - 2 VIEW COMPARISON:  08/16/2016 chest radiograph. FINDINGS: Mild cardiomegaly. Aortic atherosclerosis with calcification. Pulmonary venous hypertension. No focal consolidation. No pleural effusion or pneumothorax. No acute osseous abnormality is evident. IMPRESSION: Cardiomegaly and pulmonary vascular congestion. No focal consolidation. Electronically Signed   By: Kristine Garbe M.D.   On: 09/24/2017 06:19    ____________________________________________   PROCEDURES  Procedure(s) performed: None  Procedures  Critical Care performed: No  ____________________________________________   INITIAL IMPRESSION / ASSESSMENT  AND PLAN / ED COURSE  As part of my medical decision making, I reviewed the following data within the electronic MEDICAL RECORD NUMBER Notes from prior ED visits and [place] Controlled Substance Database   This is a 77 year old male who comes into the hospital today with some vomiting and chills.  He has been having this for about 24 hours.  My differential diagnosis includes influenza, pneumonia, gastroenteritis  We did check a CBC and a CMP on the patient.  We also check a lipase.  The patient's anion gap is 16 and his creatinine is 9.71.  His potassium is normal and he is due for dialysis today.  The patient has a white blood cell count of 14.1.  We at this time are awaiting the results of the chest x-ray and the influenza test.  I will give the patient a 500 mm bolus of normal saline as well as some Zofran.  He will be reassessed.     I did receive the results of the patient's blood work.  It appears that he does have a positive influenza A.  The patient's CMP is at his baseline and he does have a white blood cell count of 14,000.  I did check a troponin the patient's troponin was 0.08.  I gave the patient 500 mL's of normal saline.  He is not having any respiratory distress any tachypnea any hypoxia or any other concerns.  The patient's chest x-ray is negative.  Did give the patient a dose of Tamiflu 30 mg which is the hemodialysis dosage.  I will repeat the patient's troponin and 30 minutes and if his troponin is the same he will be discharged to follow-up with his primary care physician.  The patient should still attempt to receive dialysis today.  If any of his vitals change or there is any other concern for his state we will admit him to the hospital.  The patient's care will be signed out to Dr. Corky Downs ____________________________________________   FINAL CLINICAL IMPRESSION(S) / ED DIAGNOSES  Final diagnoses:  Cough  Nausea vomiting and diarrhea  Influenza A     ED Discharge Orders         Ordered    oseltamivir (TAMIFLU) 30 MG capsule  Daily     09/24/17 0730    metoCLOPramide (REGLAN) 10 MG tablet  Every 8 hours PRN     09/24/17 0730       Note:  This document was prepared using Dragon voice recognition software and may include unintentional dictation errors.    Loney Hering, MD 09/24/17 [phone]

## 2017-09-24 NOTE — ED Notes (Signed)
Pt taken to [place] via Wheelchair with daughter. VSS. NAD. Pt was steady on feet while ambulating to WC. Discharge instructions, RX and follow up discussed.

## 2017-09-24 NOTE — Discharge Instructions (Signed)
Please follow-up with your primary care physician.  You need to take your Tamiflu daily after dialysis these return with any fevers, shortness of breath, weakness dizziness syncope or any other concerns.

## 2017-09-24 NOTE — ED Notes (Signed)
Pt reports vomiting dark fluids 10-12 in the last 24 hours with cough and chills, pt denies Diarrhea, abdominal pain, fever, SOB, CP or dizziness  Pt is dialysis pt M, W and F, left upper arm fistula thrilling

## 2018-04-12 ENCOUNTER — Emergency Department: Payer: Medicare HMO

## 2018-04-12 ENCOUNTER — Inpatient Hospital Stay
Admission: EM | Admit: 2018-04-12 | Discharge: 2018-04-20 | DRG: 871 | Disposition: A | Discharge status: Home Health | Payer: Medicare HMO | Attending: Internal Medicine | Admitting: Internal Medicine

## 2018-04-12 ENCOUNTER — Encounter: Payer: Self-pay | Admitting: Emergency Medicine

## 2018-04-12 ENCOUNTER — Other Ambulatory Visit: Payer: Self-pay

## 2018-04-12 DIAGNOSIS — A414 Sepsis due to anaerobes: Secondary | ICD-10-CM | POA: Diagnosis present

## 2018-04-12 DIAGNOSIS — N2581 Secondary hyperparathyroidism of renal origin: Secondary | ICD-10-CM | POA: Diagnosis present

## 2018-04-12 DIAGNOSIS — R109 Unspecified abdominal pain: Secondary | ICD-10-CM | POA: Diagnosis present

## 2018-04-12 DIAGNOSIS — D122 Benign neoplasm of ascending colon: Secondary | ICD-10-CM

## 2018-04-12 DIAGNOSIS — C189 Malignant neoplasm of colon, unspecified: Secondary | ICD-10-CM | POA: Diagnosis present

## 2018-04-12 DIAGNOSIS — R011 Cardiac murmur, unspecified: Secondary | ICD-10-CM | POA: Diagnosis present

## 2018-04-12 DIAGNOSIS — D125 Benign neoplasm of sigmoid colon: Secondary | ICD-10-CM

## 2018-04-12 DIAGNOSIS — Z992 Dependence on renal dialysis: Secondary | ICD-10-CM | POA: Diagnosis not present

## 2018-04-12 DIAGNOSIS — K8020 Calculus of gallbladder without cholecystitis without obstruction: Secondary | ICD-10-CM | POA: Diagnosis present

## 2018-04-12 DIAGNOSIS — Z833 Family history of diabetes mellitus: Secondary | ICD-10-CM

## 2018-04-12 DIAGNOSIS — D649 Anemia, unspecified: Secondary | ICD-10-CM | POA: Diagnosis not present

## 2018-04-12 DIAGNOSIS — I120 Hypertensive chronic kidney disease with stage 5 chronic kidney disease or end stage renal disease: Secondary | ICD-10-CM | POA: Diagnosis present

## 2018-04-12 DIAGNOSIS — D123 Benign neoplasm of transverse colon: Secondary | ICD-10-CM

## 2018-04-12 DIAGNOSIS — K6389 Other specified diseases of intestine: Secondary | ICD-10-CM

## 2018-04-12 DIAGNOSIS — R700 Elevated erythrocyte sedimentation rate: Secondary | ICD-10-CM | POA: Diagnosis present

## 2018-04-12 DIAGNOSIS — N186 End stage renal disease: Secondary | ICD-10-CM | POA: Diagnosis present

## 2018-04-12 DIAGNOSIS — R7982 Elevated C-reactive protein (CRP): Secondary | ICD-10-CM | POA: Diagnosis present

## 2018-04-12 DIAGNOSIS — R197 Diarrhea, unspecified: Secondary | ICD-10-CM

## 2018-04-12 DIAGNOSIS — Z8249 Family history of ischemic heart disease and other diseases of the circulatory system: Secondary | ICD-10-CM | POA: Diagnosis not present

## 2018-04-12 DIAGNOSIS — E1122 Type 2 diabetes mellitus with diabetic chronic kidney disease: Secondary | ICD-10-CM | POA: Diagnosis present

## 2018-04-12 DIAGNOSIS — D696 Thrombocytopenia, unspecified: Secondary | ICD-10-CM | POA: Diagnosis present

## 2018-04-12 DIAGNOSIS — M25562 Pain in left knee: Secondary | ICD-10-CM | POA: Diagnosis present

## 2018-04-12 DIAGNOSIS — Z66 Do not resuscitate: Secondary | ICD-10-CM | POA: Diagnosis present

## 2018-04-12 DIAGNOSIS — B967 Clostridium perfringens [C. perfringens] as the cause of diseases classified elsewhere: Secondary | ICD-10-CM | POA: Diagnosis not present

## 2018-04-12 DIAGNOSIS — D490 Neoplasm of unspecified behavior of digestive system: Secondary | ICD-10-CM

## 2018-04-12 DIAGNOSIS — K219 Gastro-esophageal reflux disease without esophagitis: Secondary | ICD-10-CM | POA: Diagnosis present

## 2018-04-12 DIAGNOSIS — D631 Anemia in chronic kidney disease: Secondary | ICD-10-CM | POA: Diagnosis present

## 2018-04-12 DIAGNOSIS — R1084 Generalized abdominal pain: Secondary | ICD-10-CM | POA: Diagnosis not present

## 2018-04-12 DIAGNOSIS — Z79899 Other long term (current) drug therapy: Secondary | ICD-10-CM

## 2018-04-12 DIAGNOSIS — A419 Sepsis, unspecified organism: Secondary | ICD-10-CM

## 2018-04-12 DIAGNOSIS — R1900 Intra-abdominal and pelvic swelling, mass and lump, unspecified site: Secondary | ICD-10-CM | POA: Diagnosis not present

## 2018-04-12 DIAGNOSIS — B9620 Unspecified Escherichia coli [E. coli] as the cause of diseases classified elsewhere: Secondary | ICD-10-CM | POA: Diagnosis present

## 2018-04-12 DIAGNOSIS — K529 Noninfective gastroenteritis and colitis, unspecified: Secondary | ICD-10-CM | POA: Diagnosis present

## 2018-04-12 DIAGNOSIS — A048 Other specified bacterial intestinal infections: Secondary | ICD-10-CM | POA: Diagnosis not present

## 2018-04-12 DIAGNOSIS — D72829 Elevated white blood cell count, unspecified: Secondary | ICD-10-CM | POA: Diagnosis not present

## 2018-04-12 DIAGNOSIS — K3189 Other diseases of stomach and duodenum: Secondary | ICD-10-CM

## 2018-04-12 DIAGNOSIS — A4189 Other specified sepsis: Secondary | ICD-10-CM | POA: Diagnosis not present

## 2018-04-12 DIAGNOSIS — I776 Arteritis, unspecified: Secondary | ICD-10-CM | POA: Diagnosis present

## 2018-04-12 DIAGNOSIS — R7881 Bacteremia: Secondary | ICD-10-CM | POA: Diagnosis not present

## 2018-04-12 DIAGNOSIS — A0472 Enterocolitis due to Clostridium difficile, not specified as recurrent: Secondary | ICD-10-CM | POA: Diagnosis not present

## 2018-04-12 DIAGNOSIS — K635 Polyp of colon: Secondary | ICD-10-CM

## 2018-04-12 HISTORY — DX: Disorder of kidney and ureter, unspecified: N28.9

## 2018-04-12 LAB — COMPREHENSIVE METABOLIC PANEL
ALT: 17 U/L (ref 0–44)
AST: 24 U/L (ref 15–41)
Albumin: 3.7 g/dL (ref 3.5–5.0)
Alkaline Phosphatase: 77 U/L (ref 38–126)
Anion gap: 17 — ABNORMAL HIGH (ref 5–15)
BUN: 50 mg/dL — ABNORMAL HIGH (ref 8–23)
CO2: 27 mmol/L (ref 22–32)
Calcium: 8.8 mg/dL — ABNORMAL LOW (ref 8.9–10.3)
Chloride: 97 mmol/L — ABNORMAL LOW (ref 98–111)
Creatinine, Ser: 10.26 mg/dL — ABNORMAL HIGH (ref 0.61–1.24)
GFR calc Af Amer: 5 mL/min — ABNORMAL LOW (ref >60)
GFR calc non Af Amer: 4 mL/min — ABNORMAL LOW (ref >60)
Glucose, Bld: 114 mg/dL — ABNORMAL HIGH (ref 70–99)
Potassium: 4.5 mmol/L (ref 3.5–5.1)
Sodium: 141 mmol/L (ref 135–145)
Total Bilirubin: 1.2 mg/dL (ref 0.3–1.2)
Total Protein: 6.7 g/dL (ref 6.5–8.1)

## 2018-04-12 LAB — CBC WITH DIFFERENTIAL/PLATELET
Basophils Absolute: 0 10*3/uL (ref 0–0.1)
Basophils Relative: 0 %
Eosinophils Absolute: 0 10*3/uL (ref 0–0.7)
Eosinophils Relative: 0 %
HCT: 34.3 % — ABNORMAL LOW (ref 40.0–52.0)
Hemoglobin: 11.7 g/dL — ABNORMAL LOW (ref 13.0–18.0)
Lymphocytes Relative: 45 %
Lymphs Abs: 11 10*3/uL — ABNORMAL HIGH (ref 1.0–3.6)
MCH: 31 pg (ref 26.0–34.0)
MCHC: 34.1 g/dL (ref 32.0–36.0)
MCV: 90.9 fL (ref 80.0–100.0)
Monocytes Absolute: 1.3 10*3/uL — ABNORMAL HIGH (ref 0.2–1.0)
Monocytes Relative: 5 %
Neutro Abs: 12 10*3/uL — ABNORMAL HIGH (ref 1.4–6.5)
Neutrophils Relative %: 50 %
Platelets: 135 10*3/uL — ABNORMAL LOW (ref 150–440)
RBC: 3.77 MIL/uL — ABNORMAL LOW (ref 4.40–5.90)
RDW: 14.4 % (ref 11.5–14.5)
WBC: 24.3 10*3/uL — ABNORMAL HIGH (ref 3.8–10.6)

## 2018-04-12 LAB — GASTROINTESTINAL PANEL BY PCR, STOOL (REPLACES STOOL CULTURE)

## 2018-04-12 LAB — LACTIC ACID, PLASMA
Lactic Acid, Venous: 2.4 mmol/L (ref 0.5–1.9)
Lactic Acid, Venous: 1.3 mmol/L (ref 0.5–1.9)

## 2018-04-12 LAB — C DIFFICILE QUICK SCREEN W PCR REFLEX
C Diff antigen: POSITIVE — AB
C Diff toxin: NEGATIVE

## 2018-04-12 LAB — PROTIME-INR
INR: 1.34
Prothrombin Time: 16.5 seconds — ABNORMAL HIGH (ref 11.4–15.2)

## 2018-04-12 LAB — APTT: aPTT: 37 seconds — ABNORMAL HIGH (ref 24–36)

## 2018-04-12 LAB — MRSA PCR SCREENING: MRSA by PCR: NEGATIVE

## 2018-04-12 LAB — HEPARIN LEVEL (UNFRACTIONATED)
Heparin Unfractionated: <0.1 IU/mL — ABNORMAL LOW (ref 0.30–0.70)
Heparin Unfractionated: 0.33 IU/mL (ref 0.30–0.70)

## 2018-04-12 LAB — CLOSTRIDIUM DIFFICILE BY PCR, REFLEXED: Toxigenic C. Difficile by PCR: NEGATIVE

## 2018-04-12 LAB — PHOSPHORUS: Phosphorus: 5.5 mg/dL — ABNORMAL HIGH (ref 2.5–4.6)

## 2018-04-12 LAB — TSH: TSH: 1.539 u[IU]/mL (ref 0.350–4.500)

## 2018-04-12 MED ORDER — ACETAMINOPHEN 650 MG RE SUPP: 650.0000 mg | Freq: Four times a day (QID) | RECTAL | Status: DC | PRN

## 2018-04-12 MED ORDER — ONDANSETRON HCL 4 MG/2ML IJ SOLN
4.0000 mg | Freq: Once | INTRAMUSCULAR | Status: AC
  Administered 2018-04-12: 4 mg via INTRAVENOUS
  Filled 2018-04-12: qty 2

## 2018-04-12 MED ORDER — HYDROCODONE-ACETAMINOPHEN 5-325 MG PO TABS
1.0000 | ORAL_TABLET | ORAL | Status: DC | PRN
  Filled 2018-04-12: qty 1

## 2018-04-12 MED ORDER — HEPARIN BOLUS VIA INFUSION
6000.0000 [IU] | Freq: Once | INTRAVENOUS | Status: AC
  Administered 2018-04-12: 6000 [IU] via INTRAVENOUS
  Filled 2018-04-12: qty 6000

## 2018-04-12 MED ORDER — CHLORHEXIDINE GLUCONATE CLOTH 2 % EX PADS
6.0000 | MEDICATED_PAD | Freq: Every day | CUTANEOUS | Status: DC
  Administered 2018-04-13 – 2018-04-18 (×4): 6 via TOPICAL
  Filled 2018-04-12: qty 6

## 2018-04-12 MED ORDER — METRONIDAZOLE IN NACL 5-0.79 MG/ML-% IV SOLN: 500.0000 mg | Freq: Three times a day (TID) | INTRAVENOUS | Status: DC

## 2018-04-12 MED ORDER — SODIUM CHLORIDE 0.9 % IV SOLN
2.0000 g | Freq: Once | INTRAVENOUS | Status: AC
  Administered 2018-04-12: 2 g via INTRAVENOUS
  Filled 2018-04-12: qty 2

## 2018-04-12 MED ORDER — HYDRALAZINE HCL 20 MG/ML IJ SOLN
10.0000 mg | Freq: Four times a day (QID) | INTRAMUSCULAR | Status: DC | PRN
  Filled 2018-04-12 (×2): qty 1

## 2018-04-12 MED ORDER — ACETAMINOPHEN 500 MG PO TABS
1000.0000 mg | ORAL_TABLET | Freq: Once | ORAL | Status: AC
  Administered 2018-04-12: 1000 mg via ORAL
  Filled 2018-04-12: qty 2

## 2018-04-12 MED ORDER — SODIUM CHLORIDE 0.9% FLUSH
3.0000 mL | Freq: Two times a day (BID) | INTRAVENOUS | Status: DC
  Administered 2018-04-12 – 2018-04-20 (×12): 3 mL via INTRAVENOUS

## 2018-04-12 MED ORDER — METRONIDAZOLE IN NACL 5-0.79 MG/ML-% IV SOLN
500.0000 mg | Freq: Three times a day (TID) | INTRAVENOUS | Status: DC
  Administered 2018-04-12 – 2018-04-13 (×4): 500 mg via INTRAVENOUS
  Filled 2018-04-12 (×9): qty 100

## 2018-04-12 MED ORDER — ACETAMINOPHEN 325 MG PO TABS: 650.0000 mg | ORAL_TABLET | Freq: Four times a day (QID) | ORAL | Status: DC | PRN

## 2018-04-12 MED ORDER — ONDANSETRON HCL 4 MG/2ML IJ SOLN
4.0000 mg | Freq: Four times a day (QID) | INTRAMUSCULAR | Status: DC | PRN
  Administered 2018-04-12 – 2018-04-17 (×5): 4 mg via INTRAVENOUS
  Filled 2018-04-12 (×5): qty 2

## 2018-04-12 MED ORDER — SODIUM CHLORIDE 0.9% FLUSH: 3.0000 mL | INTRAVENOUS | Status: DC | PRN

## 2018-04-12 MED ORDER — ONDANSETRON HCL 4 MG PO TABS
4.0000 mg | ORAL_TABLET | Freq: Four times a day (QID) | ORAL | Status: DC | PRN
  Administered 2018-04-14: 4 mg via ORAL
  Filled 2018-04-12: qty 1

## 2018-04-12 MED ORDER — HEPARIN (PORCINE) IN NACL 100-0.45 UNIT/ML-% IJ SOLN
1800.0000 [IU]/h | INTRAMUSCULAR | Status: DC
  Administered 2018-04-12 (×2): 1600 [IU]/h via INTRAVENOUS
  Filled 2018-04-12 (×3): qty 250

## 2018-04-12 MED ORDER — VANCOMYCIN HCL IN DEXTROSE 1-5 GM/200ML-% IV SOLN
1000.0000 mg | Freq: Once | INTRAVENOUS | Status: AC
  Administered 2018-04-12: 1000 mg via INTRAVENOUS
  Filled 2018-04-12: qty 200

## 2018-04-12 MED ORDER — SODIUM CHLORIDE 0.9 % IV SOLN
250.0000 mL | INTRAVENOUS | Status: DC | PRN
  Administered 2018-04-14 – 2018-04-15 (×2): 250 mL via INTRAVENOUS
  Administered 2018-04-19: 08:00:00 via INTRAVENOUS

## 2018-04-12 NOTE — Progress Notes (Signed)
[place], [place] 04/12/18  Subjective:   Patient known to our practice from outpatient dialysis.  He presents to the hospital this time for 3 days of nausea, vomiting and diarrhea.  Today he reports left knee pain.  Appetite is poor.  He also felt like he was shivering.  He received his flu shot this past Wednesday.  Given empiric treatment with vancomycin He also complains of abdominal pain.  Noncontrast CT scan of the abdomen showed inflammatory changes surrounding the inferior mesenteric artery and inferior mesenteric vein.  Localized venous thrombosis was suspected.  Vascular surgery evaluation is pending.  Objective:  Vital signs in last 24 hours:  Temp:  [98 F (36.7 C)-102.1 F (38.9 C)] 98.1 F (36.7 C) (09/27 1311) Pulse Rate:  [84-104] 85 (09/27 1311) Resp:  [12-25] 17 (09/27 1100) BP: (128-182)/(58-73) 159/73 (09/27 1311) SpO2:  [76 %-100 %] 97 % (09/27 1311) Weight:  [90.8 kg] 90.8 kg (09/27 0628)  Weight change:  Filed Weights   04/12/18 0628  Weight: 90.8 kg    Intake/Output:   No intake or output data in the 24 hours ending 04/12/18 1442   Physical Exam: General:  No acute distress, laying in the bed  HEENT  anicteric, moist oral mucous membranes  Neck  supple  Pulm/lungs  normal breathing effort, room air  CVS/Heart  regular  Abdomen:  , Nontender  Extremities:  Peripheral edema  Neurologic:  Alert, oriented  Skin:  No acute rashes  Access:  Left forearm AV fistula       Basic Metabolic Panel:  Recent Labs  Lab 04/12/18 0639 04/12/18 1323  NA 141  --   K 4.5  --   CL 97*  --   CO2 27  --   GLUCOSE 114*  --   BUN 50*  --   CREATININE 10.26*  --   CALCIUM 8.8*  --   PHOS  --  5.5*     CBC: Recent Labs  Lab 04/12/18 0639  WBC 24.3*  NEUTROABS 12.0*  HGB 11.7*  HCT 34.3*  MCV 90.9  PLT 135*     No results found for: HEPBSAG, HEPBSAB, HEPBIGM    Microbiology:  No results found for this or any  previous visit (from the past 240 hour(s)).  Coagulation Studies: Recent Labs    04/12/18 0803  LABPROT 16.5*  INR 1.34    Urinalysis: No results for input(s): COLORURINE, LABSPEC, PHURINE, GLUCOSEU, HGBUR, BILIRUBINUR, KETONESUR, PROTEINUR, UROBILINOGEN, NITRITE, LEUKOCYTESUR in the last 72 hours.  Invalid input(s): APPERANCEUR    Imaging: Ct Abdomen Pelvis Wo Contrast  Result Date: 04/12/2018 CLINICAL DATA:  Fever and chills, nausea and diarrhea EXAM: CT ABDOMEN AND PELVIS WITHOUT CONTRAST TECHNIQUE: Multidetector CT imaging of the abdomen and pelvis was performed following the standard protocol without IV contrast. COMPARISON:  01/28/2011. FINDINGS: Lower chest: Lung bases are clear. Moderate-sized hiatal hernia is noted. Hepatobiliary: Tiny dependent gallstones are noted. The liver is within normal limits. Pancreas: Unremarkable. No pancreatic ductal dilatation or surrounding inflammatory changes. Spleen: Normal in size without focal abnormality. Adrenals/Urinary Tract: Adrenal glands are within normal limits. The kidneys are well visualized and demonstrate tiny renal calculi bilaterally. Some vascular calcifications are noted as well. In the upper pole of the left kidney there is a vague hypodensity which measures 3.2 cm in greatest dimension. This was seen on a prior CT examination measuring 2.6 cm. Bladder is well distended. No obstructive changes are seen. Stomach/Bowel: Minimal diverticular change  of the colon is noted. No obstructive or inflammatory changes of the colon are seen. The appendix is well visualized and within normal limits. No obstructive small bowel changes are seen. Vascular/Lymphatic: Aortic calcifications are noted. No significant lymphadenopathy is identified. Reproductive: Prostate is unremarkable. Other: No free fluid is noted within the abdomen or pelvis. Along the anterior aspect of the distal aorta and proximal common iliac artery there is some vague inflammatory  changes surrounding the mesenteric vessels. No significant lymphadenopathy is noted. No focal fluid collection is seen. Musculoskeletal: Degenerative changes of lumbar spine are noted. IMPRESSION: Mild inflammatory changes surrounding the inferior mesenteric artery and inferior mesenteric vein along the anterior aspect of the aorta and left common iliac artery. No focal fluid collection is noted. The etiology is uncertain as no inflamed loops of bowel are noted adjacent to these changes. This may be related to some localized venous thrombosis. Moderate-sized hiatal hernia. Cholelithiasis without complicating factors. Hypodensity in the upper pole of the left kidney which is larger than that seen in 2012 but likely represents a complicated cyst. Nonemergent ultrasound may be helpful for further evaluation. Electronically Signed   By: Inez Catalina M.D.   On: 04/12/2018 07:52   Dg Chest 2 View  Result Date: 04/12/2018 CLINICAL DATA:  77 year old male with a history of nausea vomiting EXAM: CHEST - 2 VIEW COMPARISON:  09/24/2017 FINDINGS: Cardiomediastinal silhouette unchanged in size and contour. Mild interlobular septal thickening. No pleural effusion or pneumothorax. No confluent airspace disease. Stent within left upper extremity vasculature. No displaced fracture.  Degenerative changes of the spine. IMPRESSION: Mild interlobular septal thickening may represent chronic changes in the setting of prior episodes of CHF, and/or mild acute edema. Electronically Signed   By: Corrie Mckusick D.O.   On: 04/12/2018 07:08     Medications:   . sodium chloride    . heparin 1,600 Units/hr (04/12/18 0921)  . metronidazole 500 mg (04/12/18 1410)   . Chlorhexidine Gluconate Cloth  6 each Topical Q0600  . sodium chloride flush  3 mL Intravenous Q12H   sodium chloride, acetaminophen **OR** acetaminophen, hydrALAZINE, HYDROcodone-acetaminophen, ondansetron **OR** ondansetron (ZOFRAN) IV, sodium chloride  flush  Assessment/ Plan:  77 y.o. male  ESRD, hypertension, diabetes mellitus, GERD, anemia of CKD, SHPTH, transitioned off PD to HD 6/16.  [facility]  1.  End-stage renal disease -Arrange for hemodialysis today.  Patient appears euvolemic.  No UF  2.  Anemia of CKD -Hemoglobin 11.7, above goal -Hold Epogen for now  3.  Secondary hyperparathyroidism -Monitor phosphorus, currently 5.5 -Continue home dose of binders when able to eat a normal diet  4. Acute Gastroenteritis - appears to be viral- Nausea, vomiting, diarrhea.  Patient currently on C. difficile precautions. 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 Not definitive.     LOS: 0 Vonzell Lindblad Candiss Norse 9/27/20192:42 PM  [place], [place]  Note: This note was prepared with Dragon dictation. Any transcription errors are unintentional

## 2018-04-12 NOTE — ED Notes (Signed)
Pt A&Ox3; reports went to dialysis this am "shivering" and was sent to hospital prior to receiving such; st has had N/V/D since Wed after receiving a flu shot; denies any pain; resp even/unlab, lungs clear, apical audible & regular, +BS, abd soft/nondist/nontender, +PP, -edema; card monitor in place; Dr Owens Shark at bedside

## 2018-04-12 NOTE — Plan of Care (Signed)
Toxogenic PCR CDIFF screen was negative. Removing contact precautions.

## 2018-04-12 NOTE — Progress Notes (Signed)
Hd completed 

## 2018-04-12 NOTE — Progress Notes (Signed)
Hd started  

## 2018-04-12 NOTE — Progress Notes (Signed)
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  No Known Allergies  Patient Measurements: Height: 6\' 1"  (185.4 cm) Weight: 200 lb 3.2 oz (90.8 kg) IBW/kg (Calculated) : 79.9  Vital Signs: Temp: 100 F (37.8 C) (09/27 1700) Temp Source: Oral (09/27 1700) BP: 160/73 (09/27 1830) Pulse Rate: 90 (09/27 1830)  Labs: Recent Labs    04/12/18 0639 04/12/18 0803 04/12/18 0828 04/12/18 1759  HGB 11.7*  --   --   --   HCT 34.3*  --   --   --   PLT 135*  --   --   --   APTT  --  37*  --   --   LABPROT  --  16.5*  --   --   INR  --  1.34  --   --   HEPARINUNFRC  --   --  <0.10* 0.33  CREATININE 10.26*  --   --   --     Estimated Creatinine Clearance: 6.8 mL/min (A) (by C-G formula based on SCr of 10.26 mg/dL (H)).   Assessment: 77 year old male w/ ESRD on HD w/ possible mesenteric artery and inferior mesenteric vein inflammation that may be due to venous thrombosis. Dr Posey Pronto is discussing with the radiologist to see if there is any other further evaluations can be done to evaluate. Until ruled out we will dose appropriately. Except for aPTT, baseline labs have resulted: H/H 11.7/34.3, INR 1.34 (recent CT shows no chronic liver abnormalities). He is on no anticoagulants PTA  Heparin Course: 9/27: Give 6000 units bolus x 1, Start heparin infusion at 1600 units/hr   Goal of Therapy:  Heparin level 0.3-0.7 units/ml Monitor platelets by anticoagulation protocol: Yes   Plan:  HL at 1800 = 0.33, therapeutic. Continue heparin drip at current rate. Recheck HL in 8h. CBC in AM. Continue to monitor H&H and platelets  Pharmacy will continue to follow  Rocky Morel, PharmD 04/12/2018,6:37 PM

## 2018-04-12 NOTE — ED Triage Notes (Signed)
Pt to room 4 via EMS from dialysis; N/V/D since Wed

## 2018-04-12 NOTE — ED Notes (Addendum)
Pt to xray via stretcher accomp by xray tech; verified with Dr Owens Shark, no IVFs to be admin at this time

## 2018-04-12 NOTE — ED Provider Notes (Signed)
[facility] Emergency Department Provider Note    First MD Initiated Contact with Patient 04/12/18 [phone]     (approximate)  I have reviewed the triage vital signs and the nursing notes.   HISTORY  Chief Complaint Emesis and Diarrhea    HPI Jonathan Mcgrath is a 77 y.o. male with history of chronic kidney disease currently on dialysis Monday Wednesday Friday presents to the emergency department with a 3-day history of nausea vomiting and diarrhea.  Patient presented via EMS with concern for possible code sepsis given fever and end-tidal CO2 of 25.  On arrival to the emergency department patient's temperature 102.1 tachypneic with respiratory rate of 22 however hypertensive.   Past Medical History:  Diagnosis Date  . Chronic kidney disease   . Hemodialysis access site with arteriovenous graft ([place])   . Hypertension     Patient Active Problem List   Diagnosis Date Noted  . CAP (community acquired pneumonia) 08/17/2016    Past Surgical History:  Procedure Laterality Date  . AV FISTULA PLACEMENT Left 2015   arm  . PERIPHERAL VASCULAR CATHETERIZATION Left 01/12/2016   Procedure: A/V Shuntogram/Fistulagram;  Surgeon: Algernon Huxley, MD;  Location: [facility];  Service: Cardiovascular;  Laterality: Left;  . PERIPHERAL VASCULAR CATHETERIZATION N/A 01/12/2016   Procedure: A/V Shunt Intervention;  Surgeon: Algernon Huxley, MD;  Location: [facility];  Service: Cardiovascular;  Laterality: N/A;  . PERITONEAL CATHETER INSERTION N/A   . REMOVAL OF A DIALYSIS CATHETER N/A 01/14/2015   Procedure: REMOVAL OF A  PERITONEAL DIALYSIS CATHETER;  Surgeon: Algernon Huxley, MD;  Location: [facility];  Service: Vascular;  Laterality: N/A;    Prior to Admission medications   Medication Sig Start Date End Date Taking? Authorizing Provider  acetaminophen (TYLENOL) 325 MG tablet Take 2 tablets (650 mg total) by mouth every 6 (six) hours as needed for mild pain (or  Fever >/= 101). 08/18/16   Gouru, Illene Silver, MD  albuterol (PROVENTIL HFA;VENTOLIN HFA) 108 (90 Base) MCG/ACT inhaler Inhale 2 puffs into the lungs every 6 (six) hours as needed for wheezing or shortness of breath. 08/18/16   Nicholes Mango, MD  amoxicillin-clavulanate (AUGMENTIN) 875-125 MG tablet Take 1 tablet by mouth 2 (two) times daily. 08/18/16   Nicholes Mango, MD  azithromycin (ZITHROMAX) 250 MG tablet Take one tab po once daily 08/18/16   Gouru, Aruna, MD  benzonatate (TESSALON) 200 MG capsule Take 1 capsule (200 mg total) by mouth 3 (three) times daily as needed for cough. 08/12/16   Cuthriell, Charline Bills, PA-C  cinacalcet (SENSIPAR) 30 MG tablet Take 30 mg by mouth every morning.    [provider]  metoCLOPramide (REGLAN) 10 MG tablet Take 1 tablet (10 mg total) by mouth every 8 (eight) hours as needed. 09/24/17   Loney Hering, MD  multivitamin (RENA-VIT) TABS tablet Take 1 tablet by mouth daily. 10/12/15   [provider]  ondansetron (ZOFRAN-ODT) 4 MG disintegrating tablet Take 1 tablet (4 mg total) by mouth every 8 (eight) hours as needed for nausea or vomiting. 08/12/16   Cuthriell, Charline Bills, PA-C  RENVELA 800 MG tablet Take 1,600 mg by mouth daily. 10/12/15   [provider]  saccharomyces boulardii (FLORASTOR) 250 MG capsule Take 1 capsule (250 mg total) by mouth 2 (two) times daily. 08/18/16   Nicholes Mango, MD    Allergies No known drug allergies  Family History  Problem Relation Age of Onset  .  Diabetes Mother   . Heart disease Father     Social History Social History   Tobacco Use  . Smoking status: Never Smoker  . Smokeless tobacco: Never Used  Substance Use Topics  . Alcohol use: No  . Drug use: No    Review of Systems Constitutional: No fever/chills Eyes: No visual changes. ENT: No sore throat. Cardiovascular: Denies chest pain. Respiratory: Denies shortness of breath. Gastrointestinal: No abdominal pain.  No nausea, no vomiting.  No  diarrhea.  No constipation. Genitourinary: Negative for dysuria. Musculoskeletal: Negative for neck pain.  Negative for back pain. Integumentary: Negative for rash. Neurological: Negative for headaches, focal weakness or numbness.   ____________________________________________   PHYSICAL EXAM:  VITAL SIGNS: ED Triage Vitals  Enc Vitals Group     BP 04/12/18 0700 (!) 182/70     Pulse Rate 04/12/18 0627 (!) 104     Resp 04/12/18 0627 (!) 22     Temp 04/12/18 0627 98 F (36.7 C)     Temp Source 04/12/18 0627 Oral     SpO2 04/12/18 0627 100 %     Weight 04/12/18 0628 90.8 kg (200 lb 3.2 oz)     Height 04/12/18 0628 1.854 m (6\' 1" )     Head Circumference --      Peak Flow --      Pain Score 04/12/18 0626 0     Pain Loc --      Pain Edu? --      Excl. in [place]? --     Constitutional: Alert and oriented. Well appearing and in no acute distress. Eyes: Conjunctivae are normal. PERRL. EOMI. Head: Atraumatic. Mouth/Throat: Mucous membranes are moist. Oropharynx non-erythematous. Neck: No stridor.   Cardiovascular: Tachycardia, regular rhythm. Good peripheral circulation. Grossly normal heart sounds. Respiratory: Normal respiratory effort.  No retractions. Lungs CTAB. Gastrointestinal: Soft and nontender. No distention.  Musculoskeletal: No lower extremity tenderness nor edema. No gross deformities of extremities. Neurologic:  Normal speech and language. No gross focal neurologic deficits are appreciated.  Skin:  Skin is hot, dry and intact. No rash noted. Psychiatric: Mood and affect are normal. Speech and behavior are normal.  ____________________________________________   LABS (all labs ordered are listed, but only abnormal results are displayed)  Labs Reviewed  COMPREHENSIVE METABOLIC PANEL - Abnormal; Notable for the following components:      Result Value   Chloride 97 (*)    Glucose, Bld 114 (*)    BUN 50 (*)    Creatinine, Ser 10.26 (*)    Calcium 8.8 (*)    GFR  calc non Af Amer 4 (*)    GFR calc Af Amer 5 (*)    Anion gap 17 (*)    All other components within normal limits  LACTIC ACID, PLASMA - Abnormal; Notable for the following components:   Lactic Acid, Venous 2.4 (*)    All other components within normal limits  CBC WITH DIFFERENTIAL/PLATELET - Abnormal; Notable for the following components:   WBC 24.3 (*)    RBC 3.77 (*)    Hemoglobin 11.7 (*)    HCT 34.3 (*)    Platelets 135 (*)    Neutro Abs 12.0 (*)    Lymphs Abs 11.0 (*)    Monocytes Absolute 1.3 (*)    All other components within normal limits  CULTURE, BLOOD (ROUTINE X 2)  CULTURE, BLOOD (ROUTINE X 2)  GASTROINTESTINAL PANEL BY PCR, STOOL (REPLACES STOOL CULTURE)  C DIFFICILE QUICK SCREEN W PCR  REFLEX  LACTIC ACID, PLASMA  URINALYSIS, COMPLETE (UACMP) WITH MICROSCOPIC  PROTIME-INR   ____________________________________________  EKG  ED ECG REPORT I, [place] Tarius Stangelo, the attending physician, personally viewed and interpreted this ECG.   Date: 04/12/2018  EKG Time: 6:28 AM  Rate: 105  Rhythm: Sinus tachycardia  Axis: Normal  Intervals: Normal  ST&T Change: None  ____________________________________________  RADIOLOGY I,  N Kaelynne Christley, personally viewed and evaluated these images (plain radiographs) as part of my medical decision making, as well as reviewing the written report by the radiologist.  ED MD interpretation:    Official radiology report(s): Ct Abdomen Pelvis Wo Contrast  Result Date: 04/12/2018 CLINICAL DATA:  Fever and chills, nausea and diarrhea EXAM: CT ABDOMEN AND PELVIS WITHOUT CONTRAST TECHNIQUE: Multidetector CT imaging of the abdomen and pelvis was performed following the standard protocol without IV contrast. COMPARISON:  01/28/2011. FINDINGS: Lower chest: Lung bases are clear. Moderate-sized hiatal hernia is noted. Hepatobiliary: Tiny dependent gallstones are noted. The liver is within normal limits. Pancreas: Unremarkable. No pancreatic  ductal dilatation or surrounding inflammatory changes. Spleen: Normal in size without focal abnormality. Adrenals/Urinary Tract: Adrenal glands are within normal limits. The kidneys are well visualized and demonstrate tiny renal calculi bilaterally. Some vascular calcifications are noted as well. In the upper pole of the left kidney there is a vague hypodensity which measures 3.2 cm in greatest dimension. This was seen on a prior CT examination measuring 2.6 cm. Bladder is well distended. No obstructive changes are seen. Stomach/Bowel: Minimal diverticular change of the colon is noted. No obstructive or inflammatory changes of the colon are seen. The appendix is well visualized and within normal limits. No obstructive small bowel changes are seen. Vascular/Lymphatic: Aortic calcifications are noted. No significant lymphadenopathy is identified. Reproductive: Prostate is unremarkable. Other: No free fluid is noted within the abdomen or pelvis. Along the anterior aspect of the distal aorta and proximal common iliac artery there is some vague inflammatory changes surrounding the mesenteric vessels. No significant lymphadenopathy is noted. No focal fluid collection is seen. Musculoskeletal: Degenerative changes of lumbar spine are noted. IMPRESSION: Mild inflammatory changes surrounding the inferior mesenteric artery and inferior mesenteric vein along the anterior aspect of the aorta and left common iliac artery. No focal fluid collection is noted. The etiology is uncertain as no inflamed loops of bowel are noted adjacent to these changes. This may be related to some localized venous thrombosis. Moderate-sized hiatal hernia. Cholelithiasis without complicating factors. Hypodensity in the upper pole of the left kidney which is larger than that seen in 2012 but likely represents a complicated cyst. Nonemergent ultrasound may be helpful for further evaluation. Electronically Signed   By: Inez Catalina M.D.   On: 04/12/2018  07:52   Dg Chest 2 View  Result Date: 04/12/2018 CLINICAL DATA:  77 year old male with a history of nausea vomiting EXAM: CHEST - 2 VIEW COMPARISON:  09/24/2017 FINDINGS: Cardiomediastinal silhouette unchanged in size and contour. Mild interlobular septal thickening. No pleural effusion or pneumothorax. No confluent airspace disease. Stent within left upper extremity vasculature. No displaced fracture.  Degenerative changes of the spine. IMPRESSION: Mild interlobular septal thickening may represent chronic changes in the setting of prior episodes of CHF, and/or mild acute edema. Electronically Signed   By: Corrie Mckusick D.O.   On: 04/12/2018 07:08    _______________ Critical Care performed:  .Critical Care Performed by: Gregor Hams, MD Authorized by: Gregor Hams, MD   Critical care provider statement:  Critical care time (minutes):  45   Critical care time was exclusive of:  Separately billable procedures and treating other patients and teaching time   Critical care was necessary to treat or prevent imminent or life-threatening deterioration of the following conditions:  Sepsis   Critical care was time spent personally by me on the following activities:  Development of treatment plan with patient or surrogate, discussions with consultants, evaluation of patient's response to treatment, examination of patient, obtaining history from patient or surrogate, ordering and performing treatments and interventions, ordering and review of laboratory studies, ordering and review of radiographic studies, pulse oximetry, re-evaluation of patient's condition and review of old charts   I assumed direction of critical care for this patient from another provider in my specialty: no       ____________________________________________   INITIAL IMPRESSION / ASSESSMENT AND PLAN / ED COURSE  As part of my medical decision making, I reviewed the following data within the electronic MEDICAL RECORD NUMBER    77 year old male presenting to the emergency department with history and physical exam concerning for sepsis.  Code sepsis initiated patient given broad-spectrum antibiotic coverage.  Patient tachycardic tachypneic febrile with a leukocytosis of 24.  Awaiting CT scan results as well as stool cultures.  Patient's care transferred to Dr. Clearnce Hasten with plan for admission. ____________________________________________  FINAL CLINICAL IMPRESSION(S) / ED DIAGNOSES  Final diagnoses:  Sepsis, due to unspecified organism     MEDICATIONS GIVEN DURING THIS VISIT:  Medications  metroNIDAZOLE (FLAGYL) IVPB 500 mg (500 mg Intravenous New Bag/Given 04/12/18 0759)  ceFEPIme (MAXIPIME) 2 g in sodium chloride 0.9 % 100 mL IVPB (0 g Intravenous Stopped 04/12/18 0755)  vancomycin (VANCOCIN) IVPB 1000 mg/200 mL premix (0 mg Intravenous Stopped 04/12/18 0756)  acetaminophen (TYLENOL) tablet 1,000 mg (1,000 mg Oral Given 04/12/18 0705)  ondansetron (ZOFRAN) injection 4 mg (4 mg Intravenous Given 04/12/18 8101)     ED Discharge Orders    None       Note:  This document was prepared using Dragon voice recognition software and may include unintentional dictation errors.    Gregor Hams, MD 04/12/18 [id number]

## 2018-04-12 NOTE — ED Notes (Signed)
Blue top tube recollected and sent to lab

## 2018-04-12 NOTE — Progress Notes (Signed)
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   Medical History: Past Medical History:  Diagnosis Date  . Chronic kidney disease   . Hemodialysis access site with arteriovenous graft ([place])   . 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 Dr Posey Pronto is discussing with the radiologist to see if there is any other further evaluations can be done to evaluate. Until ruled out we will dose appropriately. Except for aPTT, baseline labs have resulted: H/H 11.7/34.3, INR 1.34 (recent CT shows no chronic liver abnormalities). He is on no anticoagulants PTA  Goal of Therapy:  Heparin level 0.3-0.7 units/ml Monitor platelets by anticoagulation protocol: Yes   Plan:  Give 6000 units bolus x 1 Start heparin infusion at 1600 units/hr Check anti-Xa level in 8 hours and daily while on heparin Continue to monitor H&H and platelets  Dallie Piles, PharmD 04/12/2018,8:28 AM

## 2018-04-12 NOTE — ED Notes (Signed)
Called bed placement to assign bed  1221

## 2018-04-12 NOTE — ED Notes (Signed)
Dr. Patel at bedside 

## 2018-04-12 NOTE — ED Notes (Signed)
Heparin bolus and dose verified by this RN

## 2018-04-12 NOTE — Consult Note (Signed)
[facility] Consult Note  MRN : [id number]  Jonathan Mcgrath is a 77 y.o. (01/04/1941) male who presents with chief complaint of  Chief Complaint  Patient presents with  . Emesis  . Diarrhea   History of Present Illness:  The patient is a 77 year old male with past medical history of end-stage renal disease on chronic hemodialysis and hypertension who presented to the [facility] emergency department with a chief complaint of abdominal pain.  The patient endorses a progressively worsening 3-day history of nausea, nonbilious vomiting and nonbloody diarrhea.  The patient also notes a fever of approximately 102.1.  The patient also noted experiencing shortness of breath and felt his heart racing which prompted him to call EMS.  EMS then transferred him to [facility] ED for further evaluation.  During the patient's work-up, a CT of the abdomen without contrast was notable for "mild inflammatory changes surrounding the inferior mesenteric artery and inferior mesenteric vein along the anterior aspect of the aorta and left common iliac artery. No focal fluid collection is noted. The etiology is uncertain as no inflamed loops of bowel are noted adjacent to these changes. This may be related to some localized venous thrombosis."  Vascular surgery was consulted by Dr. Posey Pronto for evaluation of possible venous thrombosis.  Current Facility-Administered Medications  Medication Dose Route Frequency Provider Last Rate Last Dose  . Chlorhexidine Gluconate Cloth 2 % PADS 6 each  6 each Topical Q0600 Singh, Harmeet, MD      . heparin ADULT infusion 100 units/mL (25000 units/240mL sodium chloride 0.45%)  1,600 Units/hr Intravenous Continuous Dustin Flock, MD 16 mL/hr at 04/12/18 0921 1,600 Units/hr at 04/12/18 0921  . hydrALAZINE (APRESOLINE) injection 10 mg  10 mg Intravenous Q6H PRN Dustin Flock, MD      . metroNIDAZOLE  (FLAGYL) IVPB 500 mg  500 mg Intravenous Q8H Gregor Hams, MD   Stopped at 04/12/18 0932   Current Outpatient Medications  Medication Sig Dispense Refill  . cinacalcet (SENSIPAR) 30 MG tablet Take 30 mg by mouth every morning.    . ferric citrate (AURYXIA) 1 GM 210 MG(Fe) tablet Take 1 tablet by mouth daily.    . multivitamin (RENA-VIT) TABS tablet Take 1 tablet by mouth daily.  11  . acetaminophen (TYLENOL) 325 MG tablet Take 2 tablets (650 mg total) by mouth every 6 (six) hours as needed for mild pain (or Fever >/= 101). (Patient not taking: Reported on 04/12/2018)    . albuterol (PROVENTIL HFA;VENTOLIN HFA) 108 (90 Base) MCG/ACT inhaler Inhale 2 puffs into the lungs every 6 (six) hours as needed for wheezing or shortness of breath. (Patient not taking: Reported on 04/12/2018) 1 Inhaler 2  . benzonatate (TESSALON) 200 MG capsule Take 1 capsule (200 mg total) by mouth 3 (three) times daily as needed for cough. (Patient not taking: Reported on 04/12/2018) 30 capsule 0  . metoCLOPramide (REGLAN) 10 MG tablet Take 1 tablet (10 mg total) by mouth every 8 (eight) hours as needed. (Patient not taking: Reported on 04/12/2018) 20 tablet 0  . ondansetron (ZOFRAN-ODT) 4 MG disintegrating tablet Take 1 tablet (4 mg total) by mouth every 8 (eight) hours as needed for nausea or vomiting. (Patient not taking: Reported on 04/12/2018) 20 tablet 0  . saccharomyces boulardii (FLORASTOR) 250 MG capsule Take 1 capsule (250 mg total) by mouth 2 (two) times daily. (Patient not taking: Reported on 04/12/2018) 30 capsule 0   Past Medical History:  Diagnosis Date  . Chronic kidney disease   . Hemodialysis access site with arteriovenous graft ([place])   . Hypertension    Past Surgical History:  Procedure Laterality Date  . AV FISTULA PLACEMENT Left 2015   arm  . PERIPHERAL VASCULAR CATHETERIZATION Left 01/12/2016   Procedure: A/V Shuntogram/Fistulagram;  Surgeon: Algernon Huxley, MD;  Location: [facility];   Service: Cardiovascular;  Laterality: Left;  . PERIPHERAL VASCULAR CATHETERIZATION N/A 01/12/2016   Procedure: A/V Shunt Intervention;  Surgeon: Algernon Huxley, MD;  Location: [facility] [facility];  Service: Cardiovascular;  Laterality: N/A;  . PERITONEAL CATHETER INSERTION N/A   . REMOVAL OF A DIALYSIS CATHETER N/A 01/14/2015   Procedure: REMOVAL OF A  PERITONEAL DIALYSIS CATHETER;  Surgeon: Algernon Huxley, MD;  Location: [facility];  Service: Vascular;  Laterality: N/A;   Social History Social History   Tobacco Use  . Smoking status: Never Smoker  . Smokeless tobacco: Never Used  Substance Use Topics  . Alcohol use: No  . Drug use: No   Family History Family History  Problem Relation Age of Onset  . Diabetes Mother   . Heart disease Father   Denies a family history of any peripheral artery disease, venous disease or bleeding/clotting disorders.  No Known Allergies  REVIEW OF SYSTEMS (Negative unless checked)  Constitutional: [] Weight loss  [x] Fever  [x] Chills Cardiac: [] Chest pain   [] Chest pressure   [x] Palpitations   [x] Shortness of breath when laying flat   [x] Shortness of breath at rest   [x] Shortness of breath with exertion. Vascular:  [] Pain in legs with walking   [] Pain in legs at rest   [] Pain in legs when laying flat   [] Claudication   [] Pain in feet when walking  [] Pain in feet at rest  [] Pain in feet when laying flat   [] History of DVT   [] Phlebitis   [] Swelling in legs   [] Varicose veins   [] Non-healing ulcers Pulmonary:   [] Uses home oxygen   [] Productive cough   [] Hemoptysis   [] Wheeze  [] COPD   [] Asthma Neurologic:  [] Dizziness  [] Blackouts   [] Seizures   [] History of stroke   [] History of TIA  [] Aphasia   [] Temporary blindness   [] Dysphagia   [] Weakness or numbness in arms   [] Weakness or numbness in legs Musculoskeletal:  [] Arthritis   [] Joint swelling   [] Joint pain   [] Low back pain Hematologic:  [] Easy bruising  [] Easy bleeding   [] Hypercoagulable state   [] Anemic   [] Hepatitis Gastrointestinal:  [] Blood in stool   [] Vomiting blood  [] Gastroesophageal reflux/heartburn   [] Difficulty swallowing. Genitourinary:  [x] Chronic kidney disease   [] Difficult urination  [] Frequent urination  [] Burning with urination   [] Blood in urine Skin:  [] Rashes   [] Ulcers   [] Wounds Psychological:  [] History of anxiety   []  History of major depression.  Physical Examination  Vitals:   04/12/18 0916 04/12/18 0927 04/12/18 0930 04/12/18 1100  BP:   136/68 (!) 152/71  Pulse:   86 87  Resp:   12 17  Temp:  (!) 100.9 F (38.3 C)    TempSrc:  Rectal    SpO2: 97%  98% 97%  Weight:      Height:       Body mass index is 26.41 kg/m. Gen:  WD/WN, NAD Head: /AT, No temporalis wasting. Prominent temp pulse not noted. Ear/Nose/Throat: Hearing grossly intact, nares w/o erythema or drainage, oropharynx w/o Erythema/Exudate Eyes: Sclera non-icteric, conjunctiva clear Neck: Trachea midline.  No JVD.  Pulmonary:  Good air movement, respirations not labored, equal bilaterally.  Cardiac: RRR, normal S1, S2. Vascular:  Vessel Right Left  Radial Palpable Palpable  Ulnar Palpable Palpable  Brachial Palpable Palpable  Carotid Palpable, without bruit Palpable, without bruit  Aorta Not palpable N/A  Femoral Palpable Palpable  Popliteal Palpable Palpable  PT Faint Faint  DP Faint Faint   Left Upper Extremity: Good bruit and thrill. Skin intact.   Gastrointestinal: Soft, mild distention, mildly tender to palpation, (+) bowel sounds  Musculoskeletal: M/S 5/5 throughout.  Extremities without ischemic changes.  No deformity or atrophy. Mild edema. Neurologic: Sensation grossly intact in extremities.  Symmetrical.  Speech is fluent. Motor exam as listed above. Psychiatric: Judgment intact, Mood & affect appropriate for pt's clinical situation. Dermatologic: No rashes or ulcers noted.  No cellulitis or open wounds. Lymph : No Cervical, Axillary, or Inguinal  lymphadenopathy.  CBC Lab Results  Component Value Date   WBC 24.3 (H) 04/12/2018   HGB 11.7 (L) 04/12/2018   HCT 34.3 (L) 04/12/2018   MCV 90.9 04/12/2018   PLT 135 (L) 04/12/2018   BMET    Component Value Date/Time   NA 141 04/12/2018 0639   NA 140 04/13/2014 0507   K 4.5 04/12/2018 0639   K 4.6 04/13/2014 0507   CL 97 (L) 04/12/2018 0639   CL 109 (H) 04/13/2014 0507   CO2 27 04/12/2018 0639   CO2 24 04/13/2014 0507   GLUCOSE 114 (H) 04/12/2018 0639   GLUCOSE 139 (H) 04/13/2014 0507   BUN 50 (H) 04/12/2018 0639   BUN 79 (H) 04/13/2014 0507   CREATININE 10.26 (H) 04/12/2018 0639   CREATININE 9.83 (H) 04/13/2014 0507   CALCIUM 8.8 (L) 04/12/2018 0639   CALCIUM 6.8 (LL) 04/13/2014 0507   GFRNONAA 4 (L) 04/12/2018 0639   GFRNONAA 6 (L) 04/13/2014 0507   GFRNONAA 8 (L) 06/05/2013 0604   GFRAA 5 (L) 04/12/2018 0639   GFRAA 7 (L) 04/13/2014 0507   GFRAA 9 (L) 06/05/2013 0604   Estimated Creatinine Clearance: 6.8 mL/min (A) (by C-G formula based on SCr of 10.26 mg/dL (H)).  COAG Lab Results  Component Value Date   INR 1.34 04/12/2018   INR 1.13 01/12/2015   INR 1.0 12/12/2011   Radiology Ct Abdomen Pelvis Wo Contrast  Result Date: 04/12/2018 CLINICAL DATA:  Fever and chills, nausea and diarrhea EXAM: CT ABDOMEN AND PELVIS WITHOUT CONTRAST TECHNIQUE: Multidetector CT imaging of the abdomen and pelvis was performed following the standard protocol without IV contrast. COMPARISON:  01/28/2011. FINDINGS: Lower chest: Lung bases are clear. Moderate-sized hiatal hernia is noted. Hepatobiliary: Tiny dependent gallstones are noted. The liver is within normal limits. Pancreas: Unremarkable. No pancreatic ductal dilatation or surrounding inflammatory changes. Spleen: Normal in size without focal abnormality. Adrenals/Urinary Tract: Adrenal glands are within normal limits. The kidneys are well visualized and demonstrate tiny renal calculi bilaterally. Some vascular calcifications are  noted as well. In the upper pole of the left kidney there is a vague hypodensity which measures 3.2 cm in greatest dimension. This was seen on a prior CT examination measuring 2.6 cm. Bladder is well distended. No obstructive changes are seen. Stomach/Bowel: Minimal diverticular change of the colon is noted. No obstructive or inflammatory changes of the colon are seen. The appendix is well visualized and within normal limits. No obstructive small bowel changes are seen. Vascular/Lymphatic: Aortic calcifications are noted. No significant lymphadenopathy is identified. Reproductive: Prostate is unremarkable. Other: No free fluid  is noted within the abdomen or pelvis. Along the anterior aspect of the distal aorta and proximal common iliac artery there is some vague inflammatory changes surrounding the mesenteric vessels. No significant lymphadenopathy is noted. No focal fluid collection is seen. Musculoskeletal: Degenerative changes of lumbar spine are noted. IMPRESSION: Mild inflammatory changes surrounding the inferior mesenteric artery and inferior mesenteric vein along the anterior aspect of the aorta and left common iliac artery. No focal fluid collection is noted. The etiology is uncertain as no inflamed loops of bowel are noted adjacent to these changes. This may be related to some localized venous thrombosis. Moderate-sized hiatal hernia. Cholelithiasis without complicating factors. Hypodensity in the upper pole of the left kidney which is larger than that seen in 2012 but likely represents a complicated cyst. Nonemergent ultrasound may be helpful for further evaluation. Electronically Signed   By: Inez Catalina M.D.   On: 04/12/2018 07:52   Dg Chest 2 View  Result Date: 04/12/2018 CLINICAL DATA:  77 year old male with a history of nausea vomiting EXAM: CHEST - 2 VIEW COMPARISON:  09/24/2017 FINDINGS: Cardiomediastinal silhouette unchanged in size and contour. Mild interlobular septal thickening. No pleural  effusion or pneumothorax. No confluent airspace disease. Stent within left upper extremity vasculature. No displaced fracture.  Degenerative changes of the spine. IMPRESSION: Mild interlobular septal thickening may represent chronic changes in the setting of prior episodes of CHF, and/or mild acute edema. Electronically Signed   By: Corrie Mckusick D.O.   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  Would consider anti-inflammatories and antibiotics.  Recommend imaging in the next few days with a CTA.  Vascular surgery will sign off at this time. 2. ESRD: The patient denies any issues with his dialysis access at this time.  He is able to dialyze appropriately and adequately.  No indication for endovascular/surgical intervention at this time. 3. Hypertension: Encouraged good control as its slows the progression of atherosclerotic disease  Discussed with Dr. Francene Castle, PA-C  04/12/2018 12:23 PM  This note was created with Dragon medical transcription system.  Any error is purely unintentional.

## 2018-04-12 NOTE — ED Notes (Signed)
Patient transported to CT 

## 2018-04-12 NOTE — Progress Notes (Signed)
Advanced care plan.  Purpose of the Encounter: CODE STATUS  Parties in Attendance: Patient himself  Patient's Decision Capacity: Intact  Subjective/Patient's story: Patient is a 77 year old with end-stage renal disease admitted with abdominal pain    Objective/Medical story I discussed with the patient regarding his desires for cardiac and pulmonary resuscitation   Goals of care determination:  Patient states that he would like to be DNR.  Not want cardiac or pulmonary resuscitation   CODE STATUS:  DNR  Time spent discussing advanced care planning: 16 minutes

## 2018-04-12 NOTE — H&P (Signed)
[place] [facility] [place] NAME: Jonathan Mcgrath    MR#:  [id number]  DATE OF BIRTH:  [birth year]  DATE OF ADMISSION:  04/12/2018  PRIMARY CARE PHYSICIAN: [name], MD   REQUESTING/REFERRING PHYSICIAN: Gregor Hams, MD  CHIEF COMPLAINT:   Chief Complaint  Patient presents with  . Emesis  . Diarrhea    HISTORY OF PRESENT ILLNESS: Jonathan Mcgrath  is a 77 y.o. male with a known history of end-stage renal disease, hypertension who is presenting to the hospital complaining of diarrhea and emesis.  Patient also noticed to have fever in the emergency room.  He is also having some epigastric discomfort.  Patient has a history of constipation and took a Fleet enema on Monday.  In the ER patient is noted to have possible mesenteric thrombosis as well as inflammation noted.  He otherwise denies any chest pains palpitations.  He is noted to have diarrhea which he states that is loose but not foul-smelling.  PAST MEDICAL HISTORY:   Past Medical History:  Diagnosis Date  . Chronic kidney disease   . Hemodialysis access site with arteriovenous graft ([place])   . Hypertension     PAST SURGICAL HISTORY:  Past Surgical History:  Procedure Laterality Date  . AV FISTULA PLACEMENT Left 2015   arm  . PERIPHERAL VASCULAR CATHETERIZATION Left 01/12/2016   Procedure: A/V Shuntogram/Fistulagram;  Surgeon: Algernon Huxley, MD;  Location: [place] [facility];  Service: Cardiovascular;  Laterality: Left;  . PERIPHERAL VASCULAR CATHETERIZATION N/A 01/12/2016   Procedure: A/V Shunt Intervention;  Surgeon: Algernon Huxley, MD;  Location: [place];  Service: Cardiovascular;  Laterality: N/A;  . PERITONEAL CATHETER INSERTION N/A   . REMOVAL OF A DIALYSIS CATHETER N/A 01/14/2015   Procedure: REMOVAL OF A  PERITONEAL DIALYSIS CATHETER;  Surgeon: Algernon Huxley, MD;  Location: [facility];  Service: Vascular;  Laterality: N/A;    SOCIAL HISTORY:  Social History    Tobacco Use  . Smoking status: Never Smoker  . Smokeless tobacco: Never Used  Substance Use Topics  . Alcohol use: No    FAMILY HISTORY:  Family History  Problem Relation Age of Onset  . Diabetes Mother   . Heart disease Father     DRUG ALLERGIES: No Known Allergies  REVIEW OF SYSTEMS:   CONSTITUTIONAL: No fever, fatigue or weakness.  EYES: No blurred or double vision.  EARS, NOSE, AND THROAT: No tinnitus or ear pain.  RESPIRATORY: No cough, shortness of breath, wheezing or hemoptysis.  CARDIOVASCULAR: No chest pain, orthopnea, edema.  GASTROINTESTINAL: No nausea, positive vomiting, positive diarrhea or abdominal pain.  GENITOURINARY: No dysuria, hematuria.  ENDOCRINE: No polyuria, nocturia,  HEMATOLOGY: No anemia, easy bruising or bleeding SKIN: No rash or lesion. MUSCULOSKELETAL: No joint pain or arthritis.   NEUROLOGIC: No tingling, numbness, weakness.  PSYCHIATRY: No anxiety or depression.   MEDICATIONS AT HOME:  Prior to Admission medications   Medication Sig Start Date End Date Taking? Authorizing Provider  acetaminophen (TYLENOL) 325 MG tablet Take 2 tablets (650 mg total) by mouth every 6 (six) hours as needed for mild pain (or Fever >/= 101). 08/18/16   Gouru, Illene Silver, MD  albuterol (PROVENTIL HFA;VENTOLIN HFA) 108 (90 Base) MCG/ACT inhaler Inhale 2 puffs into the lungs every 6 (six) hours as needed for wheezing or shortness of breath. 08/18/16   Nicholes Mango, MD  amoxicillin-clavulanate (AUGMENTIN) 875-125 MG tablet Take 1 tablet by mouth 2 (two) times  daily. 08/18/16   Nicholes Mango, MD  azithromycin (ZITHROMAX) 250 MG tablet Take one tab po once daily 08/18/16   Gouru, Aruna, MD  benzonatate (TESSALON) 200 MG capsule Take 1 capsule (200 mg total) by mouth 3 (three) times daily as needed for cough. 08/12/16   Cuthriell, Charline Bills, PA-C  cinacalcet (SENSIPAR) 30 MG tablet Take 30 mg by mouth every morning.    [provider]  metoCLOPramide (REGLAN) 10 MG tablet  Take 1 tablet (10 mg total) by mouth every 8 (eight) hours as needed. 09/24/17   Loney Hering, MD  multivitamin (RENA-VIT) TABS tablet Take 1 tablet by mouth daily. 10/12/15   [provider]  ondansetron (ZOFRAN-ODT) 4 MG disintegrating tablet Take 1 tablet (4 mg total) by mouth every 8 (eight) hours as needed for nausea or vomiting. 08/12/16   Cuthriell, Charline Bills, PA-C  RENVELA 800 MG tablet Take 1,600 mg by mouth daily. 10/12/15   [provider]  saccharomyces boulardii (FLORASTOR) 250 MG capsule Take 1 capsule (250 mg total) by mouth 2 (two) times daily. 08/18/16   Gouru, Illene Silver, MD      PHYSICAL EXAMINATION:   VITAL SIGNS: Blood pressure (!) 182/70, pulse 99, temperature (!) 102.1 F (38.9 C), temperature source Rectal, resp. rate 17, height 6\' 1"  (1.854 m), weight 90.8 kg, SpO2 99 %.  GENERAL:  77 y.o.-year-old patient lying in the bed with no acute distress.  EYES: Pupils equal, round, reactive to light and accommodation. No scleral icterus. Extraocular muscles intact.  HEENT: Head atraumatic, normocephalic. Oropharynx and nasopharynx clear.  NECK:  Supple, no jugular venous distention. No thyroid enlargement, no tenderness.  LUNGS: Normal breath sounds bilaterally, no wheezing, rales,rhonchi or crepitation. No use of accessory muscles of respiration.  CARDIOVASCULAR: S1, S2 normal. No murmurs, rubs, or gallops.  ABDOMEN: Soft, nontender, nondistended. Bowel sounds present. No organomegaly or mass.  EXTREMITIES: No pedal edema, cyanosis, or clubbing.  NEUROLOGIC: Cranial nerves II through XII are intact. Muscle strength 5/5 in all extremities. Sensation intact. Gait not checked.  PSYCHIATRIC: The patient is alert and oriented x 3.  SKIN: No obvious rash, lesion, or ulcer.   LABORATORY PANEL:   CBC No results for input(s): WBC, HGB, HCT, PLT, MCV, MCH, MCHC, RDW, LYMPHSABS, MONOABS, EOSABS, BASOSABS, BANDABS in the last 168 hours.  Invalid input(s): NEUTRABS,  BANDSABD ------------------------------------------------------------------------------------------------------------------  Chemistries  No results for input(s): NA, K, CL, CO2, GLUCOSE, BUN, CREATININE, CALCIUM, MG, AST, ALT, ALKPHOS, BILITOT in the last 168 hours.  Invalid input(s): GFRCGP ------------------------------------------------------------------------------------------------------------------ CrCl cannot be calculated (Patient's most recent lab result is older than the maximum 21 days allowed.). ------------------------------------------------------------------------------------------------------------------ No results for input(s): TSH, T4TOTAL, T3FREE, THYROIDAB in the last 72 hours.  Invalid input(s): FREET3   Coagulation profile No results for input(s): INR, PROTIME in the last 168 hours. ------------------------------------------------------------------------------------------------------------------- No results for input(s): DDIMER in the last 72 hours. -------------------------------------------------------------------------------------------------------------------  Cardiac Enzymes No results for input(s): CKMB, TROPONINI, MYOGLOBIN in the last 168 hours.  Invalid input(s): CK ------------------------------------------------------------------------------------------------------------------ Invalid input(s): POCBNP  ---------------------------------------------------------------------------------------------------------------  Urinalysis    Component Value Date/Time   COLORURINE Yellow 04/11/2014 2115   APPEARANCEUR Clear 04/11/2014 2115   LABSPEC 1.012 04/11/2014 2115   PHURINE 7.0 04/11/2014 2115   GLUCOSEU 150 mg/dL 04/11/2014 2115   HGBUR 2+ 04/11/2014 2115   BILIRUBINUR Negative 04/11/2014 2115   KETONESUR Negative 04/11/2014 2115   PROTEINUR >=500 04/11/2014 2115   NITRITE Negative 04/11/2014 2115   LEUKOCYTESUR Negative 04/11/2014 2115  RADIOLOGY: Dg Chest 2 View  Result Date: 04/12/2018 CLINICAL DATA:  77 year old male with a history of nausea vomiting EXAM: CHEST - 2 VIEW COMPARISON:  09/24/2017 FINDINGS: Cardiomediastinal silhouette unchanged in size and contour. Mild interlobular septal thickening. No pleural effusion or pneumothorax. No confluent airspace disease. Stent within left upper extremity vasculature. No displaced fracture.  Degenerative changes of the spine. IMPRESSION: Mild interlobular septal thickening may represent chronic changes in the setting of prior episodes of CHF, and/or mild acute edema. Electronically Signed   By: Corrie Mckusick D.O.   On: 04/12/2018 07:08    EKG: Orders placed or performed during the hospital encounter of 04/12/18  . EKG 12-Lead  . EKG 12-Lead  . EKG 12-Lead  . EKG 12-Lead  . ED EKG 12-Lead  . ED EKG 12-Lead    IMPRESSION AND PLAN: Patient 77 year old presenting with diarrhea nausea vomiting  1.  Diarrhea nausea vomiting with possible mesenteric artery and inferior mesenteric vein inflammation this may be due to venous thrombosis I will place patient on heparin drip for now.  I will discuss with the radiologist to see if there is any other further evaluations can be done to evaluate  2.  Leukocytosis-stool has been sent for C. Difficile as well as GI panel to further evaluate I will continue Flagyl for now  3.  End-stage renal disease hemodialysis Monday Wednesday Friday  4.  Hypertension accelerated use PRN IV hydralazine will obtain patient's home medication list  5.  Miscellaneous heparin DVT prophylaxis     All the records are reviewed and case discussed with ED provider. Management plans discussed with the patient, family and they are in agreement.  CODE STATUS: Code Status History    Date Active Date Inactive Code Status Order ID Comments User Context   08/17/2016 [id number] 08/18/2016 2236 Full Code [id number]  Saundra Shelling, MD Inpatient   01/12/2016 1417  01/12/2016 1754 Full Code [id number]  Algernon Huxley, MD Inpatient       TOTAL TIME TAKING CARE OF THIS PATIENT: 55 minutes.    Dustin Flock M.D on 04/12/2018 at 7:54 AM  Between 7am to 6pm - Pager - [phone]  After 6pm go to [email or web address] - password [email or web address]  [facility] Office  [phone]  CC: Primary care physician; [name], MD

## 2018-04-12 NOTE — ED Notes (Signed)
Pt returned from CT °

## 2018-04-12 NOTE — Progress Notes (Signed)
CODE SEPSIS - PHARMACY COMMUNICATION  **Broad Spectrum Antibiotics should be administered within 1 hour of Sepsis diagnosis**  Time Code Sepsis Called/Page Received: 0629  Antibiotics Ordered: vanc/cefepime  Time of 1st antibiotic administration: [phone]  Additional action taken by pharmacy:   If necessary, Name of Provider/Nurse Contacted:     Tobie Lords ,PharmD Clinical Pharmacist  04/12/2018  7:05 AM

## 2018-04-13 LAB — BASIC METABOLIC PANEL
Anion gap: 9 (ref 5–15)
BUN: 23 mg/dL (ref 8–23)
CO2: 32 mmol/L (ref 22–32)
Calcium: 8.4 mg/dL — ABNORMAL LOW (ref 8.9–10.3)
Chloride: 98 mmol/L (ref 98–111)
Creatinine, Ser: 5.89 mg/dL — ABNORMAL HIGH (ref 0.61–1.24)
GFR calc Af Amer: 10 mL/min — ABNORMAL LOW (ref >60)
GFR calc non Af Amer: 8 mL/min — ABNORMAL LOW (ref >60)
Glucose, Bld: 98 mg/dL (ref 70–99)
Potassium: 3.8 mmol/L (ref 3.5–5.1)
Sodium: 139 mmol/L (ref 135–145)

## 2018-04-13 LAB — CBC
HCT: 32.5 % — ABNORMAL LOW (ref 40.0–52.0)
HCT: 31.3 % — ABNORMAL LOW (ref 40.0–52.0)
Hemoglobin: 10.8 g/dL — ABNORMAL LOW (ref 13.0–18.0)
Hemoglobin: 10.6 g/dL — ABNORMAL LOW (ref 13.0–18.0)
MCH: 30.7 pg (ref 26.0–34.0)
MCH: 31.1 pg (ref 26.0–34.0)
MCHC: 33.3 g/dL (ref 32.0–36.0)
MCHC: 33.9 g/dL (ref 32.0–36.0)
MCV: 91.9 fL (ref 80.0–100.0)
MCV: 91.8 fL (ref 80.0–100.0)
Platelets: 118 10*3/uL — ABNORMAL LOW (ref 150–440)
Platelets: 116 10*3/uL — ABNORMAL LOW (ref 150–440)
RBC: 3.54 MIL/uL — ABNORMAL LOW (ref 4.40–5.90)
RBC: 3.41 MIL/uL — ABNORMAL LOW (ref 4.40–5.90)
RDW: 14.1 % (ref 11.5–14.5)
RDW: 14.5 % (ref 11.5–14.5)
WBC: 20.9 10*3/uL — ABNORMAL HIGH (ref 3.8–10.6)
WBC: 21.9 10*3/uL — ABNORMAL HIGH (ref 3.8–10.6)

## 2018-04-13 LAB — HEPARIN LEVEL (UNFRACTIONATED): Heparin Unfractionated: 0.22 IU/mL — ABNORMAL LOW (ref 0.30–0.70)

## 2018-04-13 MED ORDER — VANCOMYCIN 50 MG/ML ORAL SOLUTION
125.0000 mg | Freq: Four times a day (QID) | ORAL | Status: DC
  Administered 2018-04-13 (×2): 125 mg via ORAL
  Filled 2018-04-13 (×3): qty 2.5

## 2018-04-13 MED ORDER — VANCOMYCIN HCL IN DEXTROSE 1-5 GM/200ML-% IV SOLN
1000.0000 mg | INTRAVENOUS | Status: DC
  Administered 2018-04-15: 1000 mg via INTRAVENOUS
  Filled 2018-04-13 (×2): qty 200

## 2018-04-13 MED ORDER — RENA-VITE PO TABS
1.0000 | ORAL_TABLET | Freq: Every day | ORAL | Status: DC
  Administered 2018-04-14 – 2018-04-20 (×5): 1 via ORAL
  Filled 2018-04-13 (×7): qty 1

## 2018-04-13 MED ORDER — CINACALCET HCL 30 MG PO TABS
30.0000 mg | ORAL_TABLET | ORAL | Status: DC
  Administered 2018-04-14: 30 mg via ORAL
  Filled 2018-04-13: qty 1

## 2018-04-13 MED ORDER — DEXTROSE IN LACTATED RINGERS 5 % IV SOLN
INTRAVENOUS | Status: AC
  Administered 2018-04-13: 14:00:00 via INTRAVENOUS

## 2018-04-13 MED ORDER — HEPARIN BOLUS VIA INFUSION
1350.0000 [IU] | Freq: Once | INTRAVENOUS | Status: AC
  Administered 2018-04-13: 1350 [IU] via INTRAVENOUS
  Filled 2018-04-13: qty 1350

## 2018-04-13 MED ORDER — VANCOMYCIN HCL 10 G IV SOLR
2000.0000 mg | Freq: Once | INTRAVENOUS | Status: AC
  Administered 2018-04-14: 2000 mg via INTRAVENOUS
  Filled 2018-04-13 (×2): qty 2000

## 2018-04-13 MED ORDER — CIPROFLOXACIN IN D5W 400 MG/200ML IV SOLN
400.0000 mg | INTRAVENOUS | Status: DC
  Administered 2018-04-13 – 2018-04-17 (×5): 400 mg via INTRAVENOUS
  Filled 2018-04-13 (×5): qty 200

## 2018-04-13 NOTE — Progress Notes (Signed)
[place], [place] 04/13/18  Subjective:   Patient known to our practice from outpatient dialysis.  He presents to the hospital this time for 3 days of nausea, vomiting and diarrhea.  Stool studies show infection.  Enteropathogenic E. coli.  He is getting treated with broad-spectrum antibiotics.  Today, he reports that he is able to eat some but still gets nauseous when he gets up.  Potassium 3.8. Dialysis done yesterday.  Tolerated well  Objective:  Vital signs in last 24 hours:  Temp:  [98.8 F (37.1 C)-100 F (37.8 C)] 98.8 F (37.1 C) (09/28 0810) Pulse Rate:  [87-97] 90 (09/28 0810) Resp:  [16-18] 16 (09/28 0810) BP: (153-187)/(67-82) 168/82 (09/28 0810) SpO2:  [97 %-100 %] 99 % (09/28 0810)  Weight change:  Filed Weights   04/12/18 0628  Weight: 90.8 kg    Intake/Output:    Intake/Output Summary (Last 24 hours) at 04/13/2018 1321 Last data filed at 04/13/2018 0609 Gross per 24 hour  Intake 596.14 ml  Output 0 ml  Net 596.14 ml     Physical Exam: General:  No acute distress, laying in the bed  HEENT  anicteric, moist oral mucous membranes  Neck  supple  Pulm/lungs  normal breathing effort, room air  CVS/Heart  regular  Abdomen:  , Nontender  Extremities:  Peripheral edema  Neurologic:  Alert, oriented  Skin:  No acute rashes  Access:  Left forearm AV fistula       Basic Metabolic Panel:  Recent Labs  Lab 04/12/18 0639 04/12/18 1323 04/13/18 0156  NA 141  --  139  K 4.5  --  3.8  CL 97*  --  98  CO2 27  --  32  GLUCOSE 114*  --  98  BUN 50*  --  23  CREATININE 10.26*  --  5.89*  CALCIUM 8.8*  --  8.4*  PHOS  --  5.5*  --      CBC: Recent Labs  Lab 04/12/18 0639 04/13/18 0156 04/13/18 0511  WBC 24.3* 20.9* 21.9*  NEUTROABS 12.0*  --   --   HGB 11.7* 10.8* 10.6*  HCT 34.3* 32.5* 31.3*  MCV 90.9 91.9 91.8  PLT 135* 118* 116*     No results found for: HEPBSAG, HEPBSAB,  HEPBIGM    Microbiology:  Recent Results (from the past 240 hour(s))  Culture, blood (Routine x 2)     Status: None (Preliminary result)   Collection Time: 04/12/18  6:37 AM  Result Value Ref Range Status   Specimen Description BLOOD RIGHT ARM  Final   Special Requests   Final    BOTTLES DRAWN AEROBIC AND ANAEROBIC Blood Culture adequate volume   Culture  Setup Time   Final    GRAM POSITIVE RODS ANAEROBIC BOTTLE ONLY CRITICAL RESULT CALLED TO, READ BACK BY AND VERIFIED WITH: DAVID BESANTI AT 2358 04/12/18.PMH Performed at [facility], [address] [place], [place] [address]    Culture GRAM POSITIVE RODS  Final   Report Status PENDING  Incomplete  Culture, blood (Routine x 2)     Status: None (Preliminary result)   Collection Time: 04/12/18  6:39 AM  Result Value Ref Range Status   Specimen Description BLOOD RIGHT HAND  Final   Special Requests   Final    BOTTLES DRAWN AEROBIC AND ANAEROBIC Blood Culture adequate volume   Culture  Setup Time   Final    ANAEROBIC BOTTLE ONLY GRAM POSITIVE RODS CRITICAL  VALUE NOTED.  VALUE IS CONSISTENT WITH PREVIOUSLY REPORTED AND CALLED VALUE. Performed at [facility], [address] [place], [place] [address]    Culture GRAM POSITIVE RODS  Final   Report Status PENDING  Incomplete  Gastrointestinal Panel by PCR , Stool     Status: Abnormal   Collection Time: 04/12/18  7:02 AM  Result Value Ref Range Status   Campylobacter species NOT DETECTED NOT DETECTED Final   Plesimonas shigelloides NOT DETECTED NOT DETECTED Final   Salmonella species NOT DETECTED NOT DETECTED Final   Yersinia enterocolitica NOT DETECTED NOT DETECTED Final   Vibrio species NOT DETECTED NOT DETECTED Final   Vibrio cholerae NOT DETECTED NOT DETECTED Final   Enteroaggregative E coli (EAEC) NOT DETECTED NOT DETECTED Final   Enteropathogenic E coli (EPEC) DETECTED (A) NOT DETECTED Final    Comment: RESULT CALLED TO, READ BACK BY AND VERIFIED  WITH: MATTHEW WRIGHT 04/12/18 @ 1751  MLK    Enterotoxigenic E coli (ETEC) NOT DETECTED NOT DETECTED Final   Shiga like toxin producing E coli (STEC) NOT DETECTED NOT DETECTED Final   Shigella/Enteroinvasive E coli (EIEC) NOT DETECTED NOT DETECTED Final   Cryptosporidium NOT DETECTED NOT DETECTED Final   Cyclospora cayetanensis NOT DETECTED NOT DETECTED Final   Entamoeba histolytica NOT DETECTED NOT DETECTED Final   Giardia lamblia NOT DETECTED NOT DETECTED Final   Adenovirus F40/41 NOT DETECTED NOT DETECTED Final   Astrovirus NOT DETECTED NOT DETECTED Final   Norovirus GI/GII NOT DETECTED NOT DETECTED Final   Rotavirus A NOT DETECTED NOT DETECTED Final   Sapovirus (I, II, IV, and V) NOT DETECTED NOT DETECTED Final    Comment: Performed at [facility], [address] [place], [place] [address]  C difficile quick scan w PCR reflex     Status: Abnormal   Collection Time: 04/12/18  7:02 AM  Result Value Ref Range Status   C Diff antigen POSITIVE (A) NEGATIVE Final   C Diff toxin NEGATIVE NEGATIVE Final   C Diff interpretation Results are indeterminate. See PCR results.  Final    Comment: Performed at [facility], [address] [place], [place] [address]  C. Diff by PCR, Reflexed     Status: None   Collection Time: 04/12/18  7:02 AM  Result Value Ref Range Status   Toxigenic C. Difficile by PCR NEGATIVE NEGATIVE Final    Comment: Patient is colonized with non toxigenic C. difficile. May not need treatment unless significant symptoms are present. Performed at [facility], [address] [place], [place] [address]   MRSA PCR Screening     Status: None   Collection Time: 04/12/18  7:00 PM  Result Value Ref Range Status   MRSA by PCR NEGATIVE NEGATIVE Final    Comment:        The GeneXpert MRSA Assay (FDA approved for NASAL specimens only), is one component of a comprehensive MRSA colonization surveillance program. It is not intended to diagnose  MRSA infection nor to guide or monitor treatment for MRSA infections. Performed at [facility], [address] [place], [place] [address]     Coagulation Studies: Recent Labs    04/12/18 0803  LABPROT 16.5*  INR 1.34    Urinalysis: No results for input(s): COLORURINE, LABSPEC, PHURINE, GLUCOSEU, HGBUR, BILIRUBINUR, KETONESUR, PROTEINUR, UROBILINOGEN, NITRITE, LEUKOCYTESUR in the last 72 hours.  Invalid input(s): APPERANCEUR    Imaging: Ct Abdomen Pelvis Wo Contrast  Result Date: 04/12/2018 CLINICAL DATA:  Fever and chills, nausea and diarrhea EXAM: CT ABDOMEN AND PELVIS WITHOUT CONTRAST TECHNIQUE: Multidetector CT imaging of the abdomen and pelvis was performed following the standard protocol without IV contrast. COMPARISON:  01/28/2011. FINDINGS: Lower chest: Lung bases are clear. Moderate-sized hiatal hernia is noted. Hepatobiliary: Tiny dependent gallstones are noted. The liver is within normal limits. Pancreas: Unremarkable. No pancreatic ductal dilatation or surrounding inflammatory changes. Spleen: Normal in size without focal abnormality. Adrenals/Urinary Tract: Adrenal glands are within normal limits. The kidneys are well visualized and demonstrate tiny renal calculi bilaterally. Some vascular calcifications are noted as well. In the upper pole of the left kidney there is a vague hypodensity which measures 3.2 cm in greatest dimension. This was seen on a prior CT examination measuring 2.6 cm. Bladder is well distended. No obstructive changes are seen. Stomach/Bowel: Minimal diverticular change of the colon is noted. No obstructive or inflammatory changes of the colon are seen. The appendix is well visualized and within normal limits. No obstructive small bowel changes are seen. Vascular/Lymphatic: Aortic calcifications are noted. No significant lymphadenopathy is identified. Reproductive: Prostate is unremarkable. Other: No free fluid is noted within the abdomen or  pelvis. Along the anterior aspect of the distal aorta and proximal common iliac artery there is some vague inflammatory changes surrounding the mesenteric vessels. No significant lymphadenopathy is noted. No focal fluid collection is seen. Musculoskeletal: Degenerative changes of lumbar spine are noted. IMPRESSION: Mild inflammatory changes surrounding the inferior mesenteric artery and inferior mesenteric vein along the anterior aspect of the aorta and left common iliac artery. No focal fluid collection is noted. The etiology is uncertain as no inflamed loops of bowel are noted adjacent to these changes. This may be related to some localized venous thrombosis. Moderate-sized hiatal hernia. Cholelithiasis without complicating factors. Hypodensity in the upper pole of the left kidney which is larger than that seen in 2012 but likely represents a complicated cyst. Nonemergent ultrasound may be helpful for further evaluation. Electronically Signed   By: Inez Catalina M.D.   On: 04/12/2018 07:52   Dg Chest 2 View  Result Date: 04/12/2018 CLINICAL DATA:  77 year old male with a history of nausea vomiting EXAM: CHEST - 2 VIEW COMPARISON:  09/24/2017 FINDINGS: Cardiomediastinal silhouette unchanged in size and contour. Mild interlobular septal thickening. No pleural effusion or pneumothorax. No confluent airspace disease. Stent within left upper extremity vasculature. No displaced fracture.  Degenerative changes of the spine. IMPRESSION: Mild interlobular septal thickening may represent chronic changes in the setting of prior episodes of CHF, and/or mild acute edema. Electronically Signed   By: Corrie Mckusick D.O.   On: 04/12/2018 07:08     Medications:   . sodium chloride    . ciprofloxacin 400 mg (04/13/18 0930)  . dextrose 5% lactated ringers     . Chlorhexidine Gluconate Cloth  6 each Topical Q0600  . [START ON 04/14/2018] cinacalcet  30 mg Oral BH-q7a  . [START ON 04/14/2018] multivitamin  1 tablet Oral  Daily  . sodium chloride flush  3 mL Intravenous Q12H   sodium chloride, acetaminophen **OR** acetaminophen, hydrALAZINE, HYDROcodone-acetaminophen, ondansetron **OR** ondansetron (ZOFRAN) IV, sodium chloride flush  Assessment/ Plan:  77 y.o. male  ESRD, hypertension, diabetes mellitus, GERD, anemia of CKD, SHPTH, transitioned off PD to HD 6/16.  [facility]MWF  1.  End-stage renal disease -We will continue hemodialysis on his regular schedule during inpatient stay -No fluid removed yesterday with dialysis  2.  Anemia of CKD -Hemoglobin 11.7, above goal -Hold  Epogen for now  3.  Secondary hyperparathyroidism -Monitor phosphorus, currently 5.5 -Continue home dose of binders when able to eat a normal diet  4. Acute Gastroenteritis -C. difficile antigen positive but toxin negative. Stool positive for enteropathogenic E. coli Treated with Cipro    LOS: 1 Shiann Kam 9/28/20191:21 PM  [place], [place]  Note: This note was prepared with Dragon dictation. Any transcription errors are unintentional

## 2018-04-13 NOTE — Consult Note (Addendum)
Pharmacy Antibiotic Note  Jonathan Mcgrath is a 77 y.o. male admitted on 04/12/2018 with Intra-Abdominal Infection .  GI Panel by PCR showing Enteropathogenic E Coli (EPEC) Pharmacy has been consulted for Cipro dosing. Patient has PMH of ESRD.  Plan: Start Ciprofloxacin 400mg  every 24 hours.   Height: 6\' 1"  (185.4 cm) Weight: 200 lb 3.2 oz (90.8 kg) IBW/kg (Calculated) : 79.9  Temp (24hrs), Avg:99.5 F (37.5 C), Min:98.1 F (36.7 C), Max:100.9 F (38.3 C)  Recent Labs  Lab 04/12/18 0639 04/12/18 0842 04/13/18 0156 04/13/18 0511  WBC 24.3*  --  20.9* 21.9*  CREATININE 10.26*  --  5.89*  --   LATICACIDVEN 2.4* 1.3  --   --     Estimated Creatinine Clearance: 11.9 mL/min (A) (by C-G formula based on SCr of 5.89 mg/dL (H)).    No Known Allergies  Antimicrobials this admission: 9/28 ciprofloxacin >>    Microbiology results: 9/27  BCx: GPR 1 of 4  9/27 GI Panel by NOT:RRNH 9/27 MRSA PCR: negative   Thank you for allowing pharmacy to be a part of this patient's care.  Pernell Dupre, PharmD, BCPS Clinical Pharmacist 04/13/2018 7:37 AM

## 2018-04-13 NOTE — Progress Notes (Signed)
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26*  --   --   --  5.89*    Estimated Creatinine Clearance: 11.9 mL/min (A) (by C-G formula based on SCr of 5.89 mg/dL (H)).   Assessment: 77 year old male w/ ESRD on HD w/ possible mesenteric artery and inferior mesenteric vein inflammation that may be due to venous thrombosis. Dr Posey Pronto is discussing with the radiologist to see if there is any other further evaluations can be done to evaluate. Until ruled out we will dose appropriately. Except for aPTT, baseline labs have resulted: H/H 11.7/34.3, INR 1.34 (recent CT shows no chronic liver abnormalities). He is on no anticoagulants PTA  Heparin Course: 9/27: Give 6000 units bolus x 1, Start heparin infusion at 1600 units/hr   Goal of Therapy:  Heparin level 0.3-0.7 units/ml Monitor platelets by anticoagulation protocol: Yes   Plan:  09/28 @ 0200 HL 0.22 subtherapeutic. Will rebolus w/ heparin 1350 units IV x 1 and increase rate to 1800 units/hr and will recheck HL @ 1200, CBC has been trending down, will monitor w/ am labs.  Tobie Lords, PharmD, BCPS Clinical Pharmacist 04/13/2018

## 2018-04-13 NOTE — Progress Notes (Signed)
Assumed care of patient at 1300. Patient slept the entire shift. Family here to visit patient. No BMs. Enteric precautions maintained.

## 2018-04-13 NOTE — Progress Notes (Signed)
PHARMACY - PHYSICIAN COMMUNICATION CRITICAL VALUE ALERT - BLOOD CULTURE IDENTIFICATION (BCID)  Jonathan Mcgrath is an 77 y.o. male who presented to [facility] on 04/12/2018 with a chief complaint of emesis, diarrhea  Assessment:  Tmax 102.1, WBC 24.3,  CT abdomen:  Mild inflammatory changes surrounding the inferior mesenteric artery and inferior mesenteric vein along the anterior aspect of the aorta and left common iliac artery. No focal fluid collection is noted. The etiology is uncertain as no inflamed loops of bowel are noted adjacent to these changes. This may be related to some localized venous thrombosis.  GI panel: Enterpathogenic E. Coli + C. Diff antigen +, PCR -, Toxin -  1/4 GPR BCID not run on  Name of physician (or Provider) Contacted: Amelia Jo  Current antibiotics: metronidazole IV 500 mg q8h  Changes to prescribed antibiotics recommended:  Recommendations accepted by provider -- result is likely a contaminant, but given the symptoms and meeting 3/4 SIRS initially will add PO vancomycin 125 mg q6h for C. Diff.  No results found for this or any previous visit.  Tobie Lords, PharmD, BCPS Clinical Pharmacist 04/13/2018

## 2018-04-13 NOTE — Progress Notes (Signed)
Pharmacy Antibiotic Note  Jonathan Mcgrath is a 77 y.o. male admitted on 04/12/2018 with GPR growing in blood.    Pharmacy has been consulted for Vancomycin dosing.  Plan: Pt on HD MWF.  Pt received 1 gm IV vancomycin on 9/27 @ 0800 but nothing since then. Will order Vancomycin 2 gm IV X 1 loading dose for 9/28 followed by vancomycin 1 gm IV Q MWF - HD. Will draw 1st Vanc trough before 3rd HD session on 10/4.   Height: 6\' 1"  (185.4 cm) Weight: 200 lb 3.2 oz (90.8 kg) IBW/kg (Calculated) : 79.9  Temp (24hrs), Avg:99 F (37.2 C), Min:98.8 F (37.1 C), Max:99.4 F (37.4 C)  Recent Labs  Lab 04/12/18 0639 04/12/18 0842 04/13/18 0156 04/13/18 0511  WBC 24.3*  --  20.9* 21.9*  CREATININE 10.26*  --  5.89*  --   LATICACIDVEN 2.4* 1.3  --   --     Estimated Creatinine Clearance: 11.9 mL/min (A) (by C-G formula based on SCr of 5.89 mg/dL (H)).    No Known Allergies  Antimicrobials this admission:   >>    >>   Dose adjustments this admission:   Microbiology results:  BCx:   GPR in 1 of 4 bottles (09/28)   UCx:    Sputum:    MRSA PCR:   Thank you for allowing pharmacy to be a part of this patient's care.  Kallon Caylor D 04/13/2018 6:16 PM

## 2018-04-13 NOTE — Progress Notes (Signed)
Patient ID: Jonathan Mcgrath, male   DOB: July 15, 1941, 77 y.o.   MRN: [id number]  [facility] PROGRESS NOTE  Jonathan Mcgrath:[id number] DOB: 24-Nov-1940 DOA: 04/12/2018 PCP: [name], MD  HPI/Subjective: Patient feeling a little bit better.  Having some abdominal pain but less than yesterday.  Previously had nausea vomiting and diarrhea but no further nausea or vomiting.  Bowel movements starting to form.  Objective: Vitals:   04/13/18 0810 04/13/18 1635  BP: (!) 168/82 (!) 155/75  Pulse: 90 81  Resp: 16 18  Temp: 98.8 F (37.1 C) 98.9 F (37.2 C)  SpO2: 99% 95%    Filed Weights   04/12/18 0628  Weight: 90.8 kg    ROS: Review of Systems  Constitutional: Negative for chills and fever.  Eyes: Negative for blurred vision.  Respiratory: Negative for cough and shortness of breath.   Cardiovascular: Negative for chest pain.  Gastrointestinal: Positive for abdominal pain. Negative for constipation, diarrhea, nausea and vomiting.  Genitourinary: Negative for dysuria.  Musculoskeletal: Negative for joint pain.  Neurological: Negative for dizziness and headaches.   Exam: Physical Exam  Constitutional: He is oriented to person, place, and time.  HENT:  Nose: No mucosal edema.  Mouth/Throat: No oropharyngeal exudate or posterior oropharyngeal edema.  Eyes: Pupils are equal, round, and reactive to light. Conjunctivae, EOM and lids are normal.  Neck: No JVD present. Carotid bruit is not present. No edema present. No thyroid mass and no thyromegaly present.  Cardiovascular: S1 normal and S2 normal. Exam reveals no gallop.  No murmur heard. Pulses:      Dorsalis pedis pulses are 2+ on the right side, and 2+ on the left side.  Respiratory: No respiratory distress. He has no wheezes. He has no rhonchi. He has no rales.  GI: Soft. Bowel sounds are normal. There is tenderness.  Musculoskeletal:       Right ankle: He exhibits swelling.       Left ankle: He exhibits no  swelling.  Lymphadenopathy:    He has no cervical adenopathy.  Neurological: He is alert and oriented to person, place, and time. No cranial nerve deficit.  Skin: Skin is warm. No rash noted. Nails show no clubbing.  Psychiatric: He has a normal mood and affect.      Data Reviewed: Basic Metabolic Panel: Recent Labs  Lab 04/12/18 0639 04/12/18 1323 04/13/18 0156  NA 141  --  139  K 4.5  --  3.8  CL 97*  --  98  CO2 27  --  32  GLUCOSE 114*  --  98  BUN 50*  --  23  CREATININE 10.26*  --  5.89*  CALCIUM 8.8*  --  8.4*  PHOS  --  5.5*  --    Liver Function Tests: Recent Labs  Lab 04/12/18 0639  AST 24  ALT 17  ALKPHOS 77  BILITOT 1.2  PROT 6.7  ALBUMIN 3.7   No results for input(s): LIPASE, AMYLASE in the last 168 hours. No results for input(s): AMMONIA in the last 168 hours. CBC: Recent Labs  Lab 04/12/18 0639 04/13/18 0156 04/13/18 0511  WBC 24.3* 20.9* 21.9*  NEUTROABS 12.0*  --   --   HGB 11.7* 10.8* 10.6*  HCT 34.3* 32.5* 31.3*  MCV 90.9 91.9 91.8  PLT 135* 118* 116*     Recent Results (from the past 240 hour(s))  Culture, blood (Routine x 2)     Status: None (Preliminary result)   Collection  Time: 04/12/18  6:37 AM  Result Value Ref Range Status   Specimen Description BLOOD RIGHT ARM  Final   Special Requests   Final    BOTTLES DRAWN AEROBIC AND ANAEROBIC Blood Culture adequate volume   Culture  Setup Time   Final    GRAM POSITIVE RODS ANAEROBIC BOTTLE ONLY CRITICAL RESULT CALLED TO, READ BACK BY AND VERIFIED WITH: DAVID BESANTI AT 2358 04/12/18.PMH Performed at [facility], [address] [place], [place] [address]    Culture GRAM POSITIVE RODS  Final   Report Status PENDING  Incomplete  Culture, blood (Routine x 2)     Status: None (Preliminary result)   Collection Time: 04/12/18  6:39 AM  Result Value Ref Range Status   Specimen Description BLOOD RIGHT HAND  Final   Special Requests   Final    BOTTLES DRAWN AEROBIC AND  ANAEROBIC Blood Culture adequate volume   Culture  Setup Time   Final    ANAEROBIC BOTTLE ONLY GRAM POSITIVE RODS CRITICAL VALUE NOTED.  VALUE IS CONSISTENT WITH PREVIOUSLY REPORTED AND CALLED VALUE. Performed at [facility], [address] [place], [place] [address]    Culture GRAM POSITIVE RODS  Final   Report Status PENDING  Incomplete  Gastrointestinal Panel by PCR , Stool     Status: Abnormal   Collection Time: 04/12/18  7:02 AM  Result Value Ref Range Status   Campylobacter species NOT DETECTED NOT DETECTED Final   Plesimonas shigelloides NOT DETECTED NOT DETECTED Final   Salmonella species NOT DETECTED NOT DETECTED Final   Yersinia enterocolitica NOT DETECTED NOT DETECTED Final   Vibrio species NOT DETECTED NOT DETECTED Final   Vibrio cholerae NOT DETECTED NOT DETECTED Final   Enteroaggregative E coli (EAEC) NOT DETECTED NOT DETECTED Final   Enteropathogenic E coli (EPEC) DETECTED (A) NOT DETECTED Final    Comment: RESULT CALLED TO, READ BACK BY AND VERIFIED WITH: MATTHEW WRIGHT 04/12/18 @ 1751  MLK    Enterotoxigenic E coli (ETEC) NOT DETECTED NOT DETECTED Final   Shiga like toxin producing E coli (STEC) NOT DETECTED NOT DETECTED Final   Shigella/Enteroinvasive E coli (EIEC) NOT DETECTED NOT DETECTED Final   Cryptosporidium NOT DETECTED NOT DETECTED Final   Cyclospora cayetanensis NOT DETECTED NOT DETECTED Final   Entamoeba histolytica NOT DETECTED NOT DETECTED Final   Giardia lamblia NOT DETECTED NOT DETECTED Final   Adenovirus F40/41 NOT DETECTED NOT DETECTED Final   Astrovirus NOT DETECTED NOT DETECTED Final   Norovirus GI/GII NOT DETECTED NOT DETECTED Final   Rotavirus A NOT DETECTED NOT DETECTED Final   Sapovirus (I, II, IV, and V) NOT DETECTED NOT DETECTED Final    Comment: Performed at [facility], [address] [place], [place] [address]  C difficile quick scan w PCR reflex     Status: Abnormal   Collection Time: 04/12/18  7:02 AM  Result  Value Ref Range Status   C Diff antigen POSITIVE (A) NEGATIVE Final   C Diff toxin NEGATIVE NEGATIVE Final   C Diff interpretation Results are indeterminate. See PCR results.  Final    Comment: Performed at [facility], [address] [place], [place] [address]  C. Diff by PCR, Reflexed     Status: None   Collection Time: 04/12/18  7:02 AM  Result Value Ref Range Status   Toxigenic C. Difficile by PCR NEGATIVE NEGATIVE Final    Comment: Patient is colonized with non toxigenic C. difficile. May not  need treatment unless significant symptoms are present. Performed at [facility], [address] [place], [place] [address]   MRSA PCR Screening     Status: None   Collection Time: 04/12/18  7:00 PM  Result Value Ref Range Status   MRSA by PCR NEGATIVE NEGATIVE Final    Comment:        The GeneXpert MRSA Assay (FDA approved for NASAL specimens only), is one component of a comprehensive MRSA colonization surveillance program. It is not intended to diagnose MRSA infection nor to guide or monitor treatment for MRSA infections. Performed at [facility], [address] [place], [place] [address]      Studies: Ct Abdomen Pelvis Wo Contrast  Result Date: 04/12/2018 CLINICAL DATA:  Fever and chills, nausea and diarrhea EXAM: CT ABDOMEN AND PELVIS WITHOUT CONTRAST TECHNIQUE: Multidetector CT imaging of the abdomen and pelvis was performed following the standard protocol without IV contrast. COMPARISON:  01/28/2011. FINDINGS: Lower chest: Lung bases are clear. Moderate-sized hiatal hernia is noted. Hepatobiliary: Tiny dependent gallstones are noted. The liver is within normal limits. Pancreas: Unremarkable. No pancreatic ductal dilatation or surrounding inflammatory changes. Spleen: Normal in size without focal abnormality. Adrenals/Urinary Tract: Adrenal glands are within normal limits. The kidneys are well visualized and demonstrate tiny renal calculi  bilaterally. Some vascular calcifications are noted as well. In the upper pole of the left kidney there is a vague hypodensity which measures 3.2 cm in greatest dimension. This was seen on a prior CT examination measuring 2.6 cm. Bladder is well distended. No obstructive changes are seen. Stomach/Bowel: Minimal diverticular change of the colon is noted. No obstructive or inflammatory changes of the colon are seen. The appendix is well visualized and within normal limits. No obstructive small bowel changes are seen. Vascular/Lymphatic: Aortic calcifications are noted. No significant lymphadenopathy is identified. Reproductive: Prostate is unremarkable. Other: No free fluid is noted within the abdomen or pelvis. Along the anterior aspect of the distal aorta and proximal common iliac artery there is some vague inflammatory changes surrounding the mesenteric vessels. No significant lymphadenopathy is noted. No focal fluid collection is seen. Musculoskeletal: Degenerative changes of lumbar spine are noted. IMPRESSION: Mild inflammatory changes surrounding the inferior mesenteric artery and inferior mesenteric vein along the anterior aspect of the aorta and left common iliac artery. No focal fluid collection is noted. The etiology is uncertain as no inflamed loops of bowel are noted adjacent to these changes. This may be related to some localized venous thrombosis. Moderate-sized hiatal hernia. Cholelithiasis without complicating factors. Hypodensity in the upper pole of the left kidney which is larger than that seen in 2012 but likely represents a complicated cyst. Nonemergent ultrasound may be helpful for further evaluation. Electronically Signed   By: Inez Catalina M.D.   On: 04/12/2018 07:52   Dg Chest 2 View  Result Date: 04/12/2018 CLINICAL DATA:  77 year old male with a history of nausea vomiting EXAM: CHEST - 2 VIEW COMPARISON:  09/24/2017 FINDINGS: Cardiomediastinal silhouette unchanged in size and contour.  Mild interlobular septal thickening. No pleural effusion or pneumothorax. No confluent airspace disease. Stent within left upper extremity vasculature. No displaced fracture.  Degenerative changes of the spine. IMPRESSION: Mild interlobular septal thickening may represent chronic changes in the setting of prior episodes of CHF, and/or mild acute edema. Electronically Signed   By: Corrie Mckusick D.O.   On: 04/12/2018 07:08    Scheduled Meds: . Chlorhexidine Gluconate Cloth  6 each Topical Q0600  . [START  ON 04/14/2018] cinacalcet  30 mg Oral [id number]  . [START ON 04/14/2018] multivitamin  1 tablet Oral Daily  . sodium chloride flush  3 mL Intravenous Q12H   Continuous Infusions: . sodium chloride    . ciprofloxacin 400 mg (04/13/18 0930)  . dextrose 5% lactated ringers 50 mL/hr at 04/13/18 1349    Assessment/Plan:  1. Abdominal pain.  Stool culture growing out enteropathogenic E. Coli.  I switched antibiotics over to Cipro this morning. 2. Positive blood culture of gram positive rod and leukocytosis.  Unclear what to make of this at this point.  Cover with IV vancomycin. 3. End-stage renal disease on hemodialysis Monday Wednesday Friday 4. 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  Vascular surgery recommended anti-inflammatories and/or antibiotics and recommend imaging with a CTA in a few days.  Code Status:     Code Status Orders  (From admission, onward)         Start     Ordered   04/12/18 1311  Do not attempt resuscitation (DNR)  Continuous    Question Answer Comment  In the event of cardiac or respiratory ARREST Do not call a "code blue"   In the event of cardiac or respiratory ARREST Do not perform Intubation, CPR, defibrillation or ACLS   In the event of cardiac or respiratory ARREST Use medication by any route, position, wound care, and other measures to relive pain and suffering. May use oxygen, suction and manual treatment of airway obstruction as needed for comfort.       04/12/18 1310        Code Status History    Date Active Date Inactive Code Status Order ID Comments User Context   04/12/2018 0827 04/12/2018 1310 DNR [id number]  Dustin Flock, MD ED   08/17/2016 0352 08/18/2016 2236 Full Code [id number]  Saundra Shelling, MD Inpatient   01/12/2016 1417 01/12/2016 1754 Full Code [id number]  Algernon Huxley, MD Inpatient      Disposition Plan: To be determined based on clinical course  Consultants:  Vascular surgery  Nephrology  Time spent: 28 minutes  [place]

## 2018-04-14 LAB — CBC
HCT: 29.9 % — ABNORMAL LOW (ref 40.0–52.0)
Hemoglobin: 10.7 g/dL — ABNORMAL LOW (ref 13.0–18.0)
MCH: 32.4 pg (ref 26.0–34.0)
MCHC: 35.6 g/dL (ref 32.0–36.0)
MCV: 91.1 fL (ref 80.0–100.0)
Platelets: 112 10*3/uL — ABNORMAL LOW (ref 150–440)
RBC: 3.29 MIL/uL — ABNORMAL LOW (ref 4.40–5.90)
RDW: 14.2 % (ref 11.5–14.5)
WBC: 17.4 10*3/uL — ABNORMAL HIGH (ref 3.8–10.6)

## 2018-04-14 MED ORDER — EPOETIN ALFA 4000 UNIT/ML IJ SOLN
4000.0000 [IU] | INTRAMUSCULAR | Status: DC
  Administered 2018-04-17: 4000 [IU] via INTRAVENOUS
  Filled 2018-04-14 (×3): qty 1

## 2018-04-14 MED ORDER — FAMOTIDINE IN NACL 20-0.9 MG/50ML-% IV SOLN
20.0000 mg | INTRAVENOUS | Status: DC
  Administered 2018-04-14 – 2018-04-18 (×4): 20 mg via INTRAVENOUS
  Filled 2018-04-14 (×5): qty 50

## 2018-04-14 NOTE — Progress Notes (Signed)
Patient ID: Jonathan Mcgrath, male   DOB: 12-10-1940, 77 y.o.   MRN: [id number]  [facility] PROGRESS NOTE  Jonathan Mcgrath MCN:[id number] DOB: [birth year] DOA: 04/12/2018 PCP: [name], MD  HPI/Subjective: Patient feeling a little bit better.  Having some abdominal pain but less than yesterday.  Previously had nausea vomiting and diarrhea but no further nausea or vomiting.  Bowel movements starting to form.  Objective: Vitals:   04/14/18 0508 04/14/18 0802  BP: (!) 152/71 (!) 166/81  Pulse: 79 75  Resp: 18   Temp: 99 F (37.2 C) 98.4 F (36.9 C)  SpO2: 93% 96%    Filed Weights   04/12/18 0628  Weight: 90.8 kg    ROS: Review of Systems  Constitutional: Negative for chills and fever.  Eyes: Negative for blurred vision.  Respiratory: Negative for cough and shortness of breath.   Cardiovascular: Negative for chest pain.  Gastrointestinal: Positive for abdominal pain and nausea. Negative for constipation, diarrhea and vomiting.  Genitourinary: Negative for dysuria.  Musculoskeletal: Negative for joint pain.  Neurological: Negative for dizziness and headaches.   Exam: Physical Exam  Constitutional: He is oriented to person, place, and time.  HENT:  Nose: No mucosal edema.  Mouth/Throat: No oropharyngeal exudate or posterior oropharyngeal edema.  Eyes: Pupils are equal, round, and reactive to light. Conjunctivae, EOM and lids are normal.  Neck: No JVD present. Carotid bruit is not present. No edema present. No thyroid mass and no thyromegaly present.  Cardiovascular: S1 normal and S2 normal. Exam reveals no gallop.  No murmur heard. Pulses:      Dorsalis pedis pulses are 2+ on the right side, and 2+ on the left side.  Respiratory: No respiratory distress. He has no wheezes. He has no rhonchi. He has no rales.  GI: Soft. Bowel sounds are normal. There is tenderness.  Musculoskeletal:       Right ankle: He exhibits swelling.       Left ankle: He exhibits no  swelling.  Lymphadenopathy:    He has no cervical adenopathy.  Neurological: He is alert and oriented to person, place, and time. No cranial nerve deficit.  Skin: Skin is warm. No rash noted. Nails show no clubbing.  Psychiatric: He has a normal mood and affect.      Data Reviewed: Basic Metabolic Panel: Recent Labs  Lab 04/12/18 0639 04/12/18 1323 04/13/18 0156  NA 141  --  139  K 4.5  --  3.8  CL 97*  --  98  CO2 27  --  32  GLUCOSE 114*  --  98  BUN 50*  --  23  CREATININE 10.26*  --  5.89*  CALCIUM 8.8*  --  8.4*  PHOS  --  5.5*  --    Liver Function Tests: Recent Labs  Lab 04/12/18 0639  AST 24  ALT 17  ALKPHOS 77  BILITOT 1.2  PROT 6.7  ALBUMIN 3.7   No results for input(s): LIPASE, AMYLASE in the last 168 hours. No results for input(s): AMMONIA in the last 168 hours. CBC: Recent Labs  Lab 04/12/18 0639 04/13/18 0156 04/13/18 0511 04/14/18 0457  WBC 24.3* 20.9* 21.9* 17.4*  NEUTROABS 12.0*  --   --   --   HGB 11.7* 10.8* 10.6* 10.7*  HCT 34.3* 32.5* 31.3* 29.9*  MCV 90.9 91.9 91.8 91.1  PLT 135* 118* 116* 112*     Recent Results (from the past 240 hour(s))  Culture, blood (Routine x 2)  Status: None (Preliminary result)   Collection Time: 04/12/18  6:37 AM  Result Value Ref Range Status   Specimen Description   Final    BLOOD RIGHT ARM Performed at [facility], [address], [place], [place] [address]    Special Requests   Final    BOTTLES DRAWN AEROBIC AND ANAEROBIC Blood Culture adequate volume Performed at [facility], [address] [place], [place] [address]    Culture  Setup Time   Final    GRAM POSITIVE RODS ANAEROBIC BOTTLE ONLY CRITICAL RESULT CALLED TO, READ BACK BY AND VERIFIED WITH: DAVID BESANTI AT 2358 04/12/18.PMH Performed at [facility], [address] [place], [place] [address]    Culture   Final    Lonell Grandchild POSITIVE RODS IDENTIFICATION TO FOLLOW Performed at [place], [place] [address] [place], [place] [address]    Report Status PENDING  Incomplete  Culture, blood (Routine x 2)     Status: None (Preliminary result)   Collection Time: 04/12/18  6:39 AM  Result Value Ref Range Status   Specimen Description BLOOD RIGHT HAND  Final   Special Requests   Final    BOTTLES DRAWN AEROBIC AND ANAEROBIC Blood Culture adequate volume   Culture  Setup Time   Final    ANAEROBIC BOTTLE ONLY GRAM POSITIVE RODS CRITICAL VALUE NOTED.  VALUE IS CONSISTENT WITH PREVIOUSLY REPORTED AND CALLED VALUE. Performed at [facility], [address] [place], [place] [address]    Culture GRAM POSITIVE RODS  Final   Report Status PENDING  Incomplete  Gastrointestinal Panel by PCR , Stool     Status: Abnormal   Collection Time: 04/12/18  7:02 AM  Result Value Ref Range Status   Campylobacter species NOT DETECTED NOT DETECTED Final   Plesimonas shigelloides NOT DETECTED NOT DETECTED Final   Salmonella species NOT DETECTED NOT DETECTED Final   Yersinia enterocolitica NOT DETECTED NOT DETECTED Final   Vibrio species NOT DETECTED NOT DETECTED Final   Vibrio cholerae NOT DETECTED NOT DETECTED Final   Enteroaggregative E coli (EAEC) NOT DETECTED NOT DETECTED Final   Enteropathogenic E coli (EPEC) DETECTED (A) NOT DETECTED Final    Comment: RESULT CALLED TO, READ BACK BY AND VERIFIED WITH: MATTHEW WRIGHT 04/12/18 @ 1751  MLK    Enterotoxigenic E coli (ETEC) NOT DETECTED NOT DETECTED Final   Shiga like toxin producing E coli (STEC) NOT DETECTED NOT DETECTED Final   Shigella/Enteroinvasive E coli (EIEC) NOT DETECTED NOT DETECTED Final   Cryptosporidium NOT DETECTED NOT DETECTED Final   Cyclospora cayetanensis NOT DETECTED NOT DETECTED Final   Entamoeba histolytica NOT DETECTED NOT DETECTED Final   Giardia lamblia NOT DETECTED NOT DETECTED Final   Adenovirus F40/41 NOT DETECTED NOT DETECTED Final   Astrovirus NOT DETECTED NOT DETECTED Final   Norovirus GI/GII NOT DETECTED  NOT DETECTED Final   Rotavirus A NOT DETECTED NOT DETECTED Final   Sapovirus (I, II, IV, and V) NOT DETECTED NOT DETECTED Final    Comment: Performed at [facility], [address] [place],  [address]  C difficile quick scan w PCR reflex     Status: Abnormal   Collection Time: 04/12/18  7:02 AM  Result Value Ref Range Status   C Diff antigen POSITIVE (A) NEGATIVE Final   C Diff toxin NEGATIVE NEGATIVE Final   C Diff interpretation Results are indeterminate. See PCR results.  Final    Comment: Performed at [facility], [address], [place], [place] [address]  C. Diff by PCR, Reflexed     Status: None   Collection Time: 04/12/18  7:02 AM  Result Value Ref Range Status   Toxigenic C. Difficile by PCR NEGATIVE NEGATIVE Final    Comment: Patient is colonized with non toxigenic C. difficile. May not need treatment unless significant symptoms are present. Performed at [facility], [address] [place], [place] [address]   MRSA PCR Screening     Status: None   Collection Time: 04/12/18  7:00 PM  Result Value Ref Range Status   MRSA by PCR NEGATIVE NEGATIVE Final    Comment:        The GeneXpert MRSA Assay (FDA approved for NASAL specimens only), is one component of a comprehensive MRSA colonization surveillance program. It is not intended to diagnose MRSA infection nor to guide or monitor treatment for MRSA infections. Performed at [facility], [address] [place], [place] [address]       Scheduled Meds: . Chlorhexidine Gluconate Cloth  6 each Topical Q0600  . [START ON 04/15/2018] epoetin (EPOGEN/PROCRIT) injection  4,000 Units Intravenous Q M,W,F-HD  . multivitamin  1 tablet Oral Daily  . sodium chloride flush  3 mL Intravenous Q12H   Continuous Infusions: . sodium chloride    . ciprofloxacin 400 mg (04/14/18 0741)  . vancomycin    . [START ON 04/15/2018] vancomycin      Assessment/Plan:  1. Abdominal pain,  nausea.  Stool culture growing out enteropathogenic E. Coli.  I switched antibiotics over to Cipro.  Empiric Pepcid. 2. Positive blood culture of gram positive rod and leukocytosis.  Unclear what to make of this at this point.  Cover with IV vancomycin. 3. End-stage renal disease on hemodialysis Monday Wednesday Friday 4. 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  Vascular surgery recommended anti-inflammatories and/or antibiotics and recommend imaging with a CTA in a few days.  Code Status:     Code Status Orders  (From admission, onward)         Start     Ordered   04/12/18 1311  Do not attempt resuscitation (DNR)  Continuous    Question Answer Comment  In the event of cardiac or respiratory ARREST Do not call a "code blue"   In the event of cardiac or respiratory ARREST Do not perform Intubation, CPR, defibrillation or ACLS   In the event of cardiac or respiratory ARREST Use medication by any route, position, wound care, and other measures to relive pain and suffering. May use oxygen, suction and manual treatment of airway obstruction as needed for comfort.      04/12/18 1310        Code Status History    Date Active Date Inactive Code Status Order ID Comments User Context   04/12/2018 0827 04/12/2018 1310 DNR [id number]  Dustin Flock, MD ED   08/17/2016 0352 08/18/2016 2236 Full Code [id number]  Saundra Shelling, MD Inpatient   01/12/2016 1417 01/12/2016 1754 Full Code [id number]  Algernon Huxley, MD Inpatient      Disposition Plan: To be determined based on clinical course  Consultants:  Vascular surgery  Nephrology  Time spent: 27 minutes. Spoke with patient's son and daughter on the phone  [place]

## 2018-04-14 NOTE — Progress Notes (Signed)
Received report from [place], [place]. Pt resting in bed, no complaints of pain. No needs at this time.

## 2018-04-14 NOTE — Progress Notes (Signed)
[place], [place] 04/14/18  Subjective:   Patient known to our practice from outpatient dialysis.  He presents to the hospital this time for 3 days of nausea, vomiting and diarrhea.  Stool studies show infection with Enteropathogenic E. coli.  He is getting treated with broad-spectrum antibiotics.  Today, he reports that he is able to eat some.  He reports that he had loose stools in the middle of night.  Objective:  Vital signs in last 24 hours:  Temp:  [98.4 F (36.9 C)-99 F (37.2 C)] 98.4 F (36.9 C) (09/29 0802) Pulse Rate:  [75-81] 75 (09/29 0802) Resp:  [18] 18 (09/29 0508) BP: (152-169)/(60-81) 166/81 (09/29 0802) SpO2:  [93 %-97 %] 96 % (09/29 0802)  Weight change:  Filed Weights   04/12/18 0628  Weight: 90.8 kg    Intake/Output:    Intake/Output Summary (Last 24 hours) at 04/14/2018 1257 Last data filed at 04/13/2018 2356 Gross per 24 hour  Intake 708.5 ml  Output -  Net 708.5 ml     Physical Exam: General:  No acute distress, laying in the bed  HEENT  anicteric, moist oral mucous membranes  Neck  supple  Pulm/lungs  normal breathing effort, room air  CVS/Heart  regular  Abdomen:   Nontender  Extremities:  Peripheral edema  Neurologic:  Alert, oriented  Skin:  No acute rashes  Access:  Left forearm AV fistula       Basic Metabolic Panel:  Recent Labs  Lab 04/12/18 0639 04/12/18 1323 04/13/18 0156  NA 141  --  139  K 4.5  --  3.8  CL 97*  --  98  CO2 27  --  32  GLUCOSE 114*  --  98  BUN 50*  --  23  CREATININE 10.26*  --  5.89*  CALCIUM 8.8*  --  8.4*  PHOS  --  5.5*  --      CBC: Recent Labs  Lab 04/12/18 0639 04/13/18 0156 04/13/18 0511 04/14/18 0457  WBC 24.3* 20.9* 21.9* 17.4*  NEUTROABS 12.0*  --   --   --   HGB 11.7* 10.8* 10.6* 10.7*  HCT 34.3* 32.5* 31.3* 29.9*  MCV 90.9 91.9 91.8 91.1  PLT 135* 118* 116* 112*     No results found for: HEPBSAG, HEPBSAB,  HEPBIGM    Microbiology:  Recent Results (from the past 240 hour(s))  Culture, blood (Routine x 2)     Status: None (Preliminary result)   Collection Time: 04/12/18  6:37 AM  Result Value Ref Range Status   Specimen Description   Final    BLOOD RIGHT ARM Performed at [facility], [address], [place], [place] [address]    Special Requests   Final    BOTTLES DRAWN AEROBIC AND ANAEROBIC Blood Culture adequate volume Performed at [facility] , [address] [place], [place] [address]    Culture  Setup Time   Final    GRAM POSITIVE RODS ANAEROBIC BOTTLE ONLY CRITICAL RESULT CALLED TO, READ BACK BY AND VERIFIED WITH: DAVID BESANTI AT 2358 04/12/18.PMH Performed at [facility], [address], [place], [place] [address]    Culture   Final    Lonell Grandchild POSITIVE RODS IDENTIFICATION TO FOLLOW Performed at [facility], [place] [address], [place],  [address]    Report Status PENDING  Incomplete  Culture, blood (Routine x 2)     Status: None (Preliminary result)   Collection Time: 04/12/18  6:39 AM  Result Value Ref Range Status   Specimen Description BLOOD RIGHT HAND  Final   Special Requests   Final    BOTTLES DRAWN AEROBIC AND ANAEROBIC Blood Culture adequate volume   Culture  Setup Time   Final    ANAEROBIC BOTTLE ONLY GRAM POSITIVE RODS CRITICAL VALUE NOTED.  VALUE IS CONSISTENT WITH PREVIOUSLY REPORTED AND CALLED VALUE. Performed at [facility], [address] [place], [place] [address]    Culture GRAM POSITIVE RODS  Final   Report Status PENDING  Incomplete  Gastrointestinal Panel by PCR , Stool     Status: Abnormal   Collection Time: 04/12/18  7:02 AM  Result Value Ref Range Status   Campylobacter species NOT DETECTED NOT DETECTED Final   Plesimonas shigelloides NOT DETECTED NOT DETECTED Final   Salmonella species NOT DETECTED NOT DETECTED Final   Yersinia enterocolitica NOT DETECTED NOT DETECTED Final   Vibrio  species NOT DETECTED NOT DETECTED Final   Vibrio cholerae NOT DETECTED NOT DETECTED Final   Enteroaggregative E coli (EAEC) NOT DETECTED NOT DETECTED Final   Enteropathogenic E coli (EPEC) DETECTED (A) NOT DETECTED Final    Comment: RESULT CALLED TO, READ BACK BY AND VERIFIED WITH: MATTHEW WRIGHT 04/12/18 @ 1751  MLK    Enterotoxigenic E coli (ETEC) NOT DETECTED NOT DETECTED Final   Shiga like toxin producing E coli (STEC) NOT DETECTED NOT DETECTED Final   Shigella/Enteroinvasive E coli (EIEC) NOT DETECTED NOT DETECTED Final   Cryptosporidium NOT DETECTED NOT DETECTED Final   Cyclospora cayetanensis NOT DETECTED NOT DETECTED Final   Entamoeba histolytica NOT DETECTED NOT DETECTED Final   Giardia lamblia NOT DETECTED NOT DETECTED Final   Adenovirus F40/41 NOT DETECTED NOT DETECTED Final   Astrovirus NOT DETECTED NOT DETECTED Final   Norovirus GI/GII NOT DETECTED NOT DETECTED Final   Rotavirus A NOT DETECTED NOT DETECTED Final   Sapovirus (I, II, IV, and V) NOT DETECTED NOT DETECTED Final    Comment: Performed at [facility], [address] [place], [place] [address]  C difficile quick scan w PCR reflex     Status: Abnormal   Collection Time: 04/12/18  7:02 AM  Result Value Ref Range Status   C Diff antigen POSITIVE (A) NEGATIVE Final   C Diff toxin NEGATIVE NEGATIVE Final   C Diff interpretation Results are indeterminate. See PCR results.  Final    Comment: Performed at [facility], [address] [place], [place] [address]  C. Diff by PCR, Reflexed     Status: None   Collection Time: 04/12/18  7:02 AM  Result Value Ref Range Status   Toxigenic C. Difficile by PCR NEGATIVE NEGATIVE Final    Comment: Patient is colonized with non toxigenic C. difficile. May not need treatment unless significant symptoms are present. Performed at [facility], [address] [place], [place] [address]   MRSA PCR Screening     Status: None   Collection Time: 04/12/18   7:00 PM  Result Value Ref Range Status   MRSA by PCR NEGATIVE NEGATIVE Final    Comment:        The GeneXpert MRSA Assay (FDA approved for NASAL specimens only), is one component of a comprehensive MRSA colonization surveillance program. It is not intended to diagnose MRSA infection nor to guide or monitor treatment for MRSA infections. Performed at [facility] [address] [place], [place] [address]     Coagulation Studies: Recent Labs  04/12/18 0803  LABPROT 16.5*  INR 1.34    Urinalysis: No results for input(s): COLORURINE, LABSPEC, PHURINE, GLUCOSEU, HGBUR, BILIRUBINUR, KETONESUR, PROTEINUR, UROBILINOGEN, NITRITE, LEUKOCYTESUR in the last 72 hours.  Invalid input(s): APPERANCEUR    Imaging: No results found.   Medications:   . sodium chloride    . ciprofloxacin 400 mg (04/14/18 0741)  . vancomycin    . [START ON 04/15/2018] vancomycin     . Chlorhexidine Gluconate Cloth  6 each Topical Q0600  . cinacalcet  30 mg Oral BH-q7a  . multivitamin  1 tablet Oral Daily  . sodium chloride flush  3 mL Intravenous Q12H   sodium chloride, acetaminophen **OR** acetaminophen, hydrALAZINE, HYDROcodone-acetaminophen, ondansetron **OR** ondansetron (ZOFRAN) IV, sodium chloride flush  Assessment/ Plan:  77 y.o. male  ESRD, hypertension, diabetes mellitus, GERD, anemia of CKD, SHPTH, transitioned off PD to HD 6/16.  [facility]  1.  End-stage renal disease -We will continue hemodialysis on his regular schedule during inpatient stay - no uf with HD as euvolemic  2.  Anemia of CKD -Hemoglobin 10.7 -  Epogen with HD  3.  Secondary hyperparathyroidism -Monitor phosphorus, currently 5.5 -Resume cinacalcet and binders at home dose when able to eat a normal diet Hold for now  4. Acute Gastroenteritis -C. difficile antigen positive but toxin negative. Stool positive for enteropathogenic E. coli Treated with Cipro    LOS: 2 Deloyd Handy  Candiss Norse 9/29/201912:57 PM  [place], [place]  Note: This note was prepared with Dragon dictation. Any transcription errors are unintentional

## 2018-04-15 ENCOUNTER — Inpatient Hospital Stay: Payer: Medicare HMO

## 2018-04-15 DIAGNOSIS — A419 Sepsis, unspecified organism: Secondary | ICD-10-CM

## 2018-04-15 LAB — PHOSPHORUS: Phosphorus: 5.5 mg/dL — ABNORMAL HIGH (ref 2.5–4.6)

## 2018-04-15 LAB — CBC
HCT: 31 % — ABNORMAL LOW (ref 40.0–52.0)
Hemoglobin: 10.3 g/dL — ABNORMAL LOW (ref 13.0–18.0)
MCH: 30.9 pg (ref 26.0–34.0)
MCHC: 33.1 g/dL (ref 32.0–36.0)
MCV: 93.4 fL (ref 80.0–100.0)
Platelets: 130 10*3/uL — ABNORMAL LOW (ref 150–440)
RBC: 3.31 MIL/uL — ABNORMAL LOW (ref 4.40–5.90)
RDW: 14.6 % — ABNORMAL HIGH (ref 11.5–14.5)
WBC: 14.6 10*3/uL — ABNORMAL HIGH (ref 3.8–10.6)

## 2018-04-15 LAB — SEDIMENTATION RATE: Sed Rate: 63 mm/hr — ABNORMAL HIGH (ref 0–20)

## 2018-04-15 MED ORDER — METRONIDAZOLE IN NACL 5-0.79 MG/ML-% IV SOLN
500.0000 mg | Freq: Three times a day (TID) | INTRAVENOUS | Status: DC
  Administered 2018-04-15 – 2018-04-17 (×6): 500 mg via INTRAVENOUS
  Filled 2018-04-15 (×8): qty 100

## 2018-04-15 MED ORDER — AMLODIPINE BESYLATE 5 MG PO TABS
5.0000 mg | ORAL_TABLET | Freq: Every day | ORAL | Status: DC
  Administered 2018-04-15 – 2018-04-20 (×6): 5 mg via ORAL
  Filled 2018-04-15 (×6): qty 1

## 2018-04-15 MED ORDER — IOPAMIDOL (ISOVUE-370) INJECTION 76%
100.0000 mL | Freq: Once | INTRAVENOUS | Status: AC | PRN
  Administered 2018-04-15: 100 mL via INTRAVENOUS

## 2018-04-15 NOTE — Progress Notes (Signed)
HD tx end    04/15/18 1930  Vital Signs  Pulse Rate 81  Pulse Rate Source Monitor  Resp 19  BP (!) 134/104  BP Location Right Arm  BP Method Automatic  Patient Position (if appropriate) Lying  Oxygen Therapy  SpO2 96 %  O2 Device Room Air  During Hemodialysis Assessment  Dialysis Fluid Bolus Normal Saline  Bolus Amount (mL) 250 mL  Intra-Hemodialysis Comments Tx completed

## 2018-04-15 NOTE — Progress Notes (Signed)
Post HD assessment    04/15/18 1937  Neurological  Level of Consciousness Alert  Orientation Level Oriented X4  Respiratory  Respiratory Pattern Regular;Unlabored  Chest Assessment Chest expansion symmetrical  Cardiac  Pulse Regular  ECG Monitor Yes  Cardiac Rhythm NSR  Vascular  R Radial Pulse +2  L Radial Pulse +2  Edema Generalized;Left lower extremity  Integumentary  Integumentary (WDL) X  Skin Color Appropriate for ethnicity  Musculoskeletal  Musculoskeletal (WDL) X  Generalized Weakness Yes  Assistive Device None  GU Assessment  Genitourinary (WDL) X  Genitourinary Symptoms  (HD)  Psychosocial  Psychosocial (WDL) WDL

## 2018-04-15 NOTE — Progress Notes (Signed)
HD tx start    04/15/18 1550  Vital Signs  Pulse Rate 71  Pulse Rate Source Monitor  Resp 15  BP (!) 163/78  BP Location Right Arm  BP Method Automatic  Patient Position (if appropriate) Lying  Oxygen Therapy  SpO2 99 %  O2 Device Room Air  During Hemodialysis Assessment  Blood Flow Rate (mL/min) 400 mL/min  Arterial Pressure (mmHg) -140 mmHg  Venous Pressure (mmHg) 230 mmHg  Transmembrane Pressure (mmHg) 50 mmHg  Ultrafiltration Rate (mL/min) 140 mL/min  Dialysate Flow Rate (mL/min) 500 ml/min  Conductivity: Machine  13.5  HD Safety Checks Performed Yes  Dialysis Fluid Bolus Normal Saline  Bolus Amount (mL) 250 mL  Intra-Hemodialysis Comments Tx initiated  Fistula / Graft Left Upper arm  No Placement Date or Time found.   Orientation: Left  Access Location: Upper arm  Status Accessed  Needle Size 15

## 2018-04-15 NOTE — Progress Notes (Signed)
[facility], [place] 04/15/18  Subjective:  Patient due for hemodialysis today.   Sitting up in chair at the moment.   Objective:  Vital signs in last 24 hours:  Temp:  [97.8 F (36.6 C)-98.9 F (37.2 C)] 97.8 F (36.6 C) (09/30 0804) Pulse Rate:  [72-78] 76 (09/30 0804) Resp:  [18] 18 (09/30 0804) BP: (155-196)/(72-83) 171/79 (09/30 0804) SpO2:  [93 %-97 %] 93 % (09/30 0804)  Weight change:  Filed Weights   04/12/18 0628  Weight: 90.8 kg    Intake/Output:    Intake/Output Summary (Last 24 hours) at 04/15/2018 1418 Last data filed at 04/15/2018 1300 Gross per 24 hour  Intake 304.08 ml  Output -  Net 304.08 ml     Physical Exam: General:  No acute distress  HEENT  anicteric, moist oral mucous membranes  Neck  supple  Pulm/lungs  normal breathing effort, room air, CTAB  CVS/Heart  regular  Abdomen:   Nontender, BS present  Extremities:  trace peripheral edema  Neurologic:  Alert, oriented, follows commands  Skin:  No acute rashes  Access:  Left forearm AV fistula       Basic Metabolic Panel:  Recent Labs  Lab 04/12/18 0639 04/12/18 1323 04/13/18 0156  NA 141  --  139  K 4.5  --  3.8  CL 97*  --  98  CO2 27  --  32  GLUCOSE 114*  --  98  BUN 50*  --  23  CREATININE 10.26*  --  5.89*  CALCIUM 8.8*  --  8.4*  PHOS  --  5.5*  --      CBC: Recent Labs  Lab 04/12/18 0639 04/13/18 0156 04/13/18 0511 04/14/18 0457  WBC 24.3* 20.9* 21.9* 17.4*  NEUTROABS 12.0*  --   --   --   HGB 11.7* 10.8* 10.6* 10.7*  HCT 34.3* 32.5* 31.3* 29.9*  MCV 90.9 91.9 91.8 91.1  PLT 135* 118* 116* 112*     No results found for: HEPBSAG, HEPBSAB, HEPBIGM    Microbiology:  Recent Results (from the past 240 hour(s))  Culture, blood (Routine x 2)     Status: None (Preliminary result)   Collection Time: 04/12/18  6:37 AM  Result Value Ref Range Status   Specimen Description   Final    BLOOD RIGHT ARM Performed at [facility], [place] [address], [place], [place] [address]    Special Requests   Final    BOTTLES DRAWN AEROBIC AND ANAEROBIC Blood Culture adequate volume Performed at [facility], [address] [place], [place] [address]    Culture  Setup Time   Final    GRAM POSITIVE RODS ANAEROBIC BOTTLE ONLY CRITICAL RESULT CALLED TO, READ BACK BY AND VERIFIED WITH: DAVID BESANTI AT 2358 04/12/18.PMH Performed at [facility], [address] [place], [place] [address]    Culture   Final    Lonell Grandchild POSITIVE RODS IDENTIFICATION TO FOLLOW Performed at [facility], [place] [address], [place], [place] [address]    Report Status PENDING  Incomplete  Culture, blood (Routine x 2)     Status: None (Preliminary result)   Collection Time: 04/12/18  6:39 AM  Result Value Ref Range Status   Specimen Description BLOOD RIGHT HAND  Final   Special Requests   Final    BOTTLES DRAWN AEROBIC AND ANAEROBIC Blood Culture adequate volume   Culture  Setup Time   Final  ANAEROBIC BOTTLE ONLY GRAM POSITIVE RODS CRITICAL VALUE NOTED.  VALUE IS CONSISTENT WITH PREVIOUSLY REPORTED AND CALLED VALUE. Performed at [facility], [address] [place], [place] [address]    Culture GRAM POSITIVE RODS  Final   Report Status PENDING  Incomplete  Gastrointestinal Panel by PCR , Stool     Status: Abnormal   Collection Time: 04/12/18  7:02 AM  Result Value Ref Range Status   Campylobacter species NOT DETECTED NOT DETECTED Final   Plesimonas shigelloides NOT DETECTED NOT DETECTED Final   Salmonella species NOT DETECTED NOT DETECTED Final   Yersinia enterocolitica NOT DETECTED NOT DETECTED Final   Vibrio species NOT DETECTED NOT DETECTED Final   Vibrio cholerae NOT DETECTED NOT DETECTED Final   Enteroaggregative E coli (EAEC) NOT DETECTED NOT DETECTED Final   Enteropathogenic E coli (EPEC) DETECTED (A) NOT DETECTED Final    Comment: RESULT CALLED TO, READ BACK BY AND VERIFIED WITH: MATTHEW WRIGHT  04/12/18 @ 1751  MLK    Enterotoxigenic E coli (ETEC) NOT DETECTED NOT DETECTED Final   Shiga like toxin producing E coli (STEC) NOT DETECTED NOT DETECTED Final   Shigella/Enteroinvasive E coli (EIEC) NOT DETECTED NOT DETECTED Final   Cryptosporidium NOT DETECTED NOT DETECTED Final   Cyclospora cayetanensis NOT DETECTED NOT DETECTED Final   Entamoeba histolytica NOT DETECTED NOT DETECTED Final   Giardia lamblia NOT DETECTED NOT DETECTED Final   Adenovirus F40/41 NOT DETECTED NOT DETECTED Final   Astrovirus NOT DETECTED NOT DETECTED Final   Norovirus GI/GII NOT DETECTED NOT DETECTED Final   Rotavirus A NOT DETECTED NOT DETECTED Final   Sapovirus (I, II, IV, and V) NOT DETECTED NOT DETECTED Final    Comment: Performed at [facility], [address] [place], [place] [address]  C difficile quick scan w PCR reflex     Status: Abnormal   Collection Time: 04/12/18  7:02 AM  Result Value Ref Range Status   C Diff antigen POSITIVE (A) NEGATIVE Final   C Diff toxin NEGATIVE NEGATIVE Final   C Diff interpretation Results are indeterminate. See PCR results.  Final    Comment: Performed at [facility], [address] [place], [place] [address]  C. Diff by PCR, Reflexed     Status: None   Collection Time: 04/12/18  7:02 AM  Result Value Ref Range Status   Toxigenic C. Difficile by PCR NEGATIVE NEGATIVE Final    Comment: Patient is colonized with non toxigenic C. difficile. May not need treatment unless significant symptoms are present. Performed at [facility], [address] [place], [place] [address]   MRSA PCR Screening     Status: None   Collection Time: 04/12/18  7:00 PM  Result Value Ref Range Status   MRSA by PCR NEGATIVE NEGATIVE Final    Comment:        The GeneXpert MRSA Assay (FDA approved for NASAL specimens only), is one component of a comprehensive MRSA colonization surveillance program. It is not intended to diagnose MRSA infection nor to  guide or monitor treatment for MRSA infections. Performed at [facility], [address] [place], [place] [address]     Coagulation Studies: No results for input(s): LABPROT, INR in the last 72 hours.  Urinalysis: No results for input(s): COLORURINE, LABSPEC, PHURINE, GLUCOSEU, HGBUR, BILIRUBINUR, KETONESUR, PROTEINUR, UROBILINOGEN, NITRITE, LEUKOCYTESUR in the last 72 hours.  Invalid input(s): APPERANCEUR    Imaging: Ct Angio Abd/pel W/ And/or W/o  Result Date:  04/15/2018 CLINICAL DATA:  77 year old male with nausea, vomiting, diarrhea and stool cultures positive for enteropathogenic E coli. EXAM: CT ANGIOGRAPHY ABDOMEN AND PELVIS WITH CONTRAST AND WITHOUT CONTRAST TECHNIQUE: Multidetector CT imaging of the abdomen and pelvis was performed using the standard protocol during bolus administration of intravenous contrast. Multiplanar reconstructed images and MIPs were obtained and reviewed to evaluate the vascular anatomy. CONTRAST:  166mL ISOVUE-370 IOPAMIDOL (ISOVUE-370) INJECTION 76% COMPARISON:  CT scan of the abdomen and pelvis 04/12/2018 FINDINGS: VASCULAR Aorta: Mild heterogeneous atherosclerotic plaque. No evidence of aneurysm or dissection. Celiac: Patent without evidence of aneurysm, dissection, vasculitis or significant stenosis. SMA: Patent without evidence of aneurysm, dissection, vasculitis or significant stenosis. Renals: Both renal arteries are patent without evidence of aneurysm, dissection, vasculitis, fibromuscular dysplasia or significant stenosis. IMA: Patent without evidence of aneurysm, dissection, vasculitis or significant stenosis. Inflow: Circumferential arterial wall thickening beginning at the aortic bifurcation and extending along both common iliac arteries, particularly the left. On the right, the inflammatory changes. Prior to the bifurcation. On the left, the inflammatory changes extend beyond the bifurcation and along the external iliac artery for a short  distance. No significant stenosis, dissection or aneurysm. There is mild atherosclerotic plaque and irregularity along the left common iliac artery. Moderate focal plaque along the right common iliac artery. The external iliac arteries are relatively spared from disease. Proximal Outflow: Bilateral common femoral and visualized portions of the superficial and profunda femoral arteries are patent without evidence of aneurysm, dissection, vasculitis or significant stenosis. Veins: Venous opacification of the IVC and iliac veins is relatively limited on the portal venous phase imaging. No definite thrombus or asymmetry identified. The hepatic, portal and renal veins are all widely patent as are the splanchnic veins. Review of the MIP images confirms the above findings. NON-VASCULAR Lower chest: Incompletely imaged aortic valvular calcifications. Cardiomegaly. No pericardial effusion. Trace left-sided pleural effusion and associated mild atelectasis. No pulmonary edema or focal airspace consolidation. Mild scarring in the inferior right middle lobe. Moderately large sliding hiatal hernia. Hepatobiliary: Geographic hypoattenuation in the left hemi-liver adjacent to the fissure for the falciform ligament is nonspecific but most suggestive of benign focal fatty infiltration. Otherwise, no solid hepatic lesion. Subtle gallstones were better visualized on the recent noncontrast enhanced CT scan. No intra or extrahepatic biliary ductal dilatation. Pancreas: Unremarkable. No pancreatic ductal dilatation or surrounding inflammatory changes. Spleen: Normal in size without focal abnormality. Adrenals/Urinary Tract: Normal adrenal glands. 2.3 cm nonenhancing simple cyst centrally in the upper pole of the left kidney. Punctate nonobstructing right lower pole nephrolithiasis again noted. No evidence of hydronephrosis. The left ureter passes through the inflammatory changes overlying the left iliac vessels without evidence of  external compression. The right ureter is unremarkable. The bladder is within normal limits. Stomach/Bowel: No evidence of obstruction or focal bowel wall thickening. Normal appendix in the right lower quadrant. The terminal ileum is unremarkable. Lymphatic: No suspicious lymphadenopathy. Reproductive: Prostatomegaly. Other: Small fat containing umbilical hernia. a No abdominopelvic ascites. Musculoskeletal: No acute fracture or aggressive appearing lytic or blastic osseous lesion. Multilevel degenerative disc disease. There is a linear tract extending from the skin surface inferiorly over the left anterior abdominal wall. A small peripherally high density nodular region is present just superficial to the left rectus abdominus muscle. Given the configuration, this likely represents chronic changes related to the presence of a prior peritoneal dialysis catheter. IMPRESSION: VASCULAR 1. Circumferential arterial wall thickening beginning at the aortic bifurcation and extending along the left greater than right  common iliac arteries. There are associated inflammatory changes with inflammatory stranding in the immediately adjacent retroperitoneal fat and left pelvic sidewall. 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 Recommend vascular surgery consultation if not already obtained. 2. No evidence of deep venous thrombosis at this time. 3. Aortic Atherosclerosis (ICD10-170.0). 4. Cardiomegaly. 5. Aortic valvular calcifications. NON-VASCULAR 1. No acute nonvascular abnormality within the abdomen or pelvis. 2. Nonobstructing right lower pole nephrolithiasis. 3. Left upper pole simple renal cyst. 4. Trace left-sided pleural effusion. 5. Multilevel degenerative disc disease. 6. Small umbilical hernia. 7. Moderate hiatal hernia. 8. Additional ancillary findings as above. Signed, Criselda Peaches, MD, [facility] Radiology Electronically Signed   By: Jacqulynn Cadet M.D.   On: 04/15/2018 09:55     Medications:   . sodium chloride Stopped (04/15/18 0300)  . ciprofloxacin 400 mg (04/15/18 0940)  . famotidine (PEPCID) IV 20 mg (04/14/18 1412)  . vancomycin 1,000 mg (04/15/18 1308)   . amLODipine  5 mg Oral Daily  . Chlorhexidine Gluconate Cloth  6 each Topical Q0600  . epoetin (EPOGEN/PROCRIT) injection  4,000 Units Intravenous Q M,W,F-HD  . multivitamin  1 tablet Oral Daily  . sodium chloride flush  3 mL Intravenous Q12H   sodium chloride, acetaminophen **OR** acetaminophen, hydrALAZINE, HYDROcodone-acetaminophen, ondansetron **OR** ondansetron (ZOFRAN) IV, sodium chloride flush  Assessment/ Plan:  77 y.o. male  ESRD, hypertension, diabetes mellitus, GERD, anemia of CKD, SHPTH, transitioned off PD to HD 6/16.  [facility]  1.  End-stage renal disease -Patient due for hemodialysis today.  Orders have been prepared.  2.  Anemia of CKD -Continue Epogen 4000 units IV with dialysis.  3.  Secondary hyperparathyroidism -Continue to monitor bone mineral metabolism parameters.  Recently has not been taking binders or Sensipar as an outpatient.  4. Acute Gastroenteritis -C. difficile antigen positive but toxin negative. Stool positive for enteropathogenic E. coli Treated with Cipro    LOS: 3 Kahiau Schewe 9/30/20192:18 PM  [place], [place]  Note: This note was prepared with Dragon dictation. Any transcription errors are unintentional

## 2018-04-15 NOTE — Progress Notes (Signed)
PT Cancellation Note  Patient Details Name: Jonathan Mcgrath MRN: [id number] DOB: July 25, 1940   Cancelled Treatment:     Pt out of room for imaging at this time. Will re-attempt at a later time when pt is available.    Ernie Avena, SPT 04/15/2018, 9:09 AM

## 2018-04-15 NOTE — Progress Notes (Signed)
Pre HD assessment    04/15/18 1539  Vital Signs  Temp 98.6 F (37 C)  Temp Source Oral  Pulse Rate 71  Pulse Rate Source Monitor  Resp 17  BP (!) 177/73  BP Location Right Arm  BP Method Automatic  Patient Position (if appropriate) Lying  Oxygen Therapy  SpO2 97 %  O2 Device Room Air  Pain Assessment  Pain Scale 0-10  Pain Score 0  Dialysis Weight  Weight 95.8 kg  Type of Weight Pre-Dialysis  Time-Out for Hemodialysis  What Procedure? HD  Pt Identifiers(min of two) First/Last Name;MRN/Account#  Correct Site? Yes  Correct Side? Yes  Correct Procedure? Yes  Consents Verified? Yes  Rad Studies Available? N/A  Safety Precautions Reviewed? Yes  Engineer, civil (consulting) Number  (3A)  Station Number 3  UF/Alarm Test Passed  Conductivity: Meter 13.6  Conductivity: Machine  13.5  pH 7.6  Reverse Osmosis main  Normal Saline Lot Number [id number]  Dialyzer Lot Number [id number]  Disposable Set Lot Number [id number]  Machine Temperature 98.6 F (37 C)  Musician and Audible Yes  Blood Lines Intact and Secured Yes  Pre Treatment Patient Checks  Vascular access used during treatment Fistula  Hepatitis B Surface Antigen Results Negative  Date Hepatitis B Surface Antigen Drawn 09/05/17  Hepatitis B Surface Antibody 336 (>10)  Date Hepatitis B Surface Antibody Drawn 09/05/17  Hemodialysis Consent Verified Yes  Hemodialysis Standing Orders Initiated Yes  ECG (Telemetry) Monitor On Yes  Prime Ordered Normal Saline  Length of  DialysisTreatment -hour(s) 3.5 Hour(s)  Dialyzer Elisio 17H NR  Dialysate 3K, 2.5 Ca  Dialysis Anticoagulant None  Dialysate Flow Ordered 800  Blood Flow Rate Ordered 400 mL/min  Ultrafiltration Goal 0 Liters  Pre Treatment Labs Phosphorus;CBC  Dialysis Blood Pressure Support Ordered Normal Saline  Education / Care Plan  Dialysis Education Provided Yes  Documented Education in Care Plan Yes  Fistula / Graft Left Upper arm  No Placement Date or  Time found.   Orientation: Left  Access Location: Upper arm  Site Condition No complications  Fistula / Graft Assessment Present;Thrill;Bruit  Drainage Description None

## 2018-04-15 NOTE — Progress Notes (Signed)
ID Pt was in dialysis when I went to see him Chart reviewed Admitted with diarrhea and fever Clostridium species in blood X 2 sets Concerning for colon ca Add metronidazole IV Will see the patient tomorrow

## 2018-04-15 NOTE — Progress Notes (Signed)
Pt to Dialysis

## 2018-04-15 NOTE — Care Management Important Message (Signed)
Important Message  Patient Details  Name: Jonathan Mcgrath MRN: [id number] Date of Birth: July 11, 1941   Medicare Important Message Given:  Yes    [name] 04/15/2018, 2:10 PM

## 2018-04-15 NOTE — Evaluation (Signed)
Physical Therapy Evaluation Patient Details Name: Jonathan Mcgrath MRN: [id number] DOB: 03/11/1941 Today's Date: 04/15/2018   History of Present Illness  Pt is a 77 y.o. male with history of chronic kidney disease currently on dialysis. Pt presents to the emergency department with a 3-day history of nausea vomiting and diarrhea with generalized weakness.   Clinical Impression  Upon arrival pt awake and enthused to work with PT indicating he would enjoy getting up and moving. Pt currently requiring no physical assistance to perform basic bed mobility and was quick to get into sitting from supine. Pt has previously used no AD and was able to ambulate 160 feet with 1 UE on IV pole, however minimal use of IV pole for support. Pt demonstrating occasional staggering and sway due to imbalance, however no lightheadedness or dizziness noted. Pt also demonstrating difficulty during balance testing as noted below. Minimal ability to maintain feet together eyes closed and tandem stance before LOB. HR in mid 80's beginning of session and reaching mid 90's with exertion. Pt would benefit from a [facility] for stability and safety in addition to HHPT to improve overall balance and generalized weakness caused by most recent hospital stay.     Follow Up Recommendations Home health PT    Equipment Recommendations  Cane    Recommendations for Other Services       Precautions / Restrictions Precautions Precautions: Fall Restrictions Weight Bearing Restrictions: No      Mobility  Bed Mobility Overal bed mobility: Independent                Transfers Overall transfer level: Independent                  Ambulation/Gait Ambulation/Gait assistance: Min guard Gait Distance (Feet): 160 Feet Assistive device: IV Pole       General Gait Details: Occasional staggering of gait and mild deviation noted, pt reporting feeling unsteady, normal gait speed, minimal use of IV pole, no physical assist  needed to maintain balance  Stairs            Wheelchair Mobility    Modified Rankin (Stroke Patients Only)       Balance Overall balance assessment: Needs assistance   Sitting balance-Leahy Scale: Normal       Standing balance-Leahy Scale: Fair Standing balance comment: tandem and feet together eyes closed pt having most difficulty with     Tandem Stance - Right Leg: 3(LOB) Tandem Stance - Left Leg: 5(LOB) Rhomberg - Eyes Opened: 20 Rhomberg - Eyes Closed: 10(LOB)                 Pertinent Vitals/Pain Pain Assessment: No/denies pain(pt reports some mild abdominal pain this morning but not currently)    Home Living Family/patient expects to be discharged to:: Private residence Living Arrangements: Children Available Help at Discharge: Family Type of Home: House Home Access: Stairs to enter Entrance Stairs-Rails: Right Entrance Stairs-Number of Steps: 2 Home Layout: One level Home Equipment: None      Prior Function Level of Independence: Independent               Hand Dominance        Extremity/Trunk Assessment   Upper Extremity Assessment Upper Extremity Assessment: Overall WFL for tasks assessed    Lower Extremity Assessment Lower Extremity Assessment: Overall WFL for tasks assessed       Communication   Communication: No difficulties  Cognition Arousal/Alertness: Awake/alert Behavior During Therapy: WFL for tasks assessed/performed  Overall Cognitive Status: Within Functional Limits for tasks assessed                                        General Comments      Exercises     Assessment/Plan    PT Assessment Patient needs continued PT services  PT Problem List Decreased activity tolerance;Decreased balance;Decreased mobility;Decreased knowledge of use of DME;Decreased safety awareness;Decreased strength       PT Treatment Interventions DME instruction;Balance training;Gait training;Functional mobility  training;Therapeutic activities;Therapeutic exercise;Patient/family education    PT Goals (Current goals can be found in the Care Plan section)  Acute Rehab PT Goals Patient Stated Goal: improve balance  PT Goal Formulation: With patient/family Time For Goal Achievement: 04/29/18 Potential to Achieve Goals: Good    Frequency Min 2X/week   Barriers to discharge        Co-evaluation               AM-PAC PT "6 Clicks" Daily Activity  Outcome Measure Difficulty turning over in bed (including adjusting bedclothes, sheets and blankets)?: None Difficulty moving from lying on back to sitting on the side of the bed? : None Difficulty sitting down on and standing up from a chair with arms (e.g., wheelchair, bedside commode, etc,.)?: A Little Help needed moving to and from a bed to chair (including a wheelchair)?: None Help needed walking in hospital room?: A Little Help needed climbing 3-5 steps with a railing? : A Little 6 Click Score: 21    End of Session Equipment Utilized During Treatment: Gait belt Activity Tolerance: Patient tolerated treatment well Patient left: in chair;with call bell/phone within reach;with chair alarm set Nurse Communication: Mobility status PT Visit Diagnosis: Unsteadiness on feet (R26.81);Other abnormalities of gait and mobility (R26.89);Muscle weakness (generalized) (M62.81)    Time: 9357-0177 PT Time Calculation (min) (ACUTE ONLY): 23 min   Charges:              Ernie Avena, SPT 04/15/2018, 12:54 PM

## 2018-04-15 NOTE — Progress Notes (Signed)
Post HD assessment. Pt tolerated tx well without c/o or complication. Net UF 8ml, goal met.    04/15/18 1938  Vital Signs  Temp 98.6 F (37 C)  Temp Source Oral  Pulse Rate 83  Pulse Rate Source Monitor  Resp 19  BP (!) 191/81  BP Location Right Arm  BP Method Automatic  Patient Position (if appropriate) Lying  Oxygen Therapy  SpO2 96 %  O2 Device Room Air  Dialysis Weight  Weight 96 kg  Type of Weight Post-Dialysis  Post-Hemodialysis Assessment  Rinseback Volume (mL) 250 mL  KECN 72.2 V  Dialyzer Clearance Lightly streaked  Duration of HD Treatment -hour(s) 3.5 hour(s)  Hemodialysis Intake (mL) 500 mL  UF Total -Machine (mL) 508 mL  Net UF (mL) 8 mL  Tolerated HD Treatment Yes  AVG/AVF Arterial Site Held (minutes) 10 minutes  AVG/AVF Venous Site Held (minutes) 10 minutes  Education / Care Plan  Dialysis Education Provided Yes  Documented Education in Care Plan Yes  Fistula / Graft Left Upper arm  No Placement Date or Time found.   Orientation: Left  Access Location: Upper arm  Site Condition No complications  Fistula / Graft Assessment Present;Thrill;Bruit  Status Deaccessed  Drainage Description None   

## 2018-04-15 NOTE — Progress Notes (Signed)
Patient ID: Jonathan Mcgrath, male   DOB: 07/15/1941, 77 y.o.   MRN: [id number]  [employer] PROGRESS NOTE  Jonathan Mcgrath TGG:[id number] DOB: February 17, 1941 DOA: 04/12/2018 PCP: [name], MD  HPI/Subjective: Patient feeling a little bit better.  Complains of slight discomfort in his abdomen and a little nausea.  No vomiting.  Tolerating diet.  Objective: Vitals:   04/14/18 2333 04/15/18 0804  BP: (!) 155/76 (!) 171/79  Pulse: 78 76  Resp: 18 18  Temp: 98.9 F (37.2 C) 97.8 F (36.6 C)  SpO2: 97% 93%    Filed Weights   04/12/18 0628  Weight: 90.8 kg    ROS: Review of Systems  Constitutional: Negative for chills and fever.  Eyes: Negative for blurred vision.  Respiratory: Negative for cough and shortness of breath.   Cardiovascular: Negative for chest pain.  Gastrointestinal: Positive for abdominal pain and nausea. Negative for constipation, diarrhea and vomiting.  Genitourinary: Negative for dysuria.  Musculoskeletal: Negative for joint pain.  Neurological: Negative for dizziness and headaches.   Exam: Physical Exam  Constitutional: He is oriented to person, place, and time.  HENT:  Nose: No mucosal edema.  Mouth/Throat: No oropharyngeal exudate or posterior oropharyngeal edema.  Eyes: Pupils are equal, round, and reactive to light. Conjunctivae, EOM and lids are normal.  Neck: No JVD present. Carotid bruit is not present. No edema present. No thyroid mass and no thyromegaly present.  Cardiovascular: S1 normal and S2 normal. Exam reveals no gallop.  No murmur heard. Pulses:      Dorsalis pedis pulses are 2+ on the right side, and 2+ on the left side.  Respiratory: No respiratory distress. He has no wheezes. He has no rhonchi. He has no rales.  GI: Soft. Bowel sounds are normal. There is tenderness.  Musculoskeletal:       Right ankle: He exhibits swelling.       Left ankle: He exhibits swelling.  Lymphadenopathy:    He has no cervical adenopathy.   Neurological: He is alert and oriented to person, place, and time. No cranial nerve deficit.  Skin: Skin is warm. No rash noted. Nails show no clubbing.  Psychiatric: He has a normal mood and affect.      Data Reviewed: Basic Metabolic Panel: Recent Labs  Lab 04/12/18 0639 04/12/18 1323 04/13/18 0156  NA 141  --  139  K 4.5  --  3.8  CL 97*  --  98  CO2 27  --  32  GLUCOSE 114*  --  98  BUN 50*  --  23  CREATININE 10.26*  --  5.89*  CALCIUM 8.8*  --  8.4*  PHOS  --  5.5*  --    Liver Function Tests: Recent Labs  Lab 04/12/18 0639  AST 24  ALT 17  ALKPHOS 77  BILITOT 1.2  PROT 6.7  ALBUMIN 3.7   CBC: Recent Labs  Lab 04/12/18 0639 04/13/18 0156 04/13/18 0511 04/14/18 0457  WBC 24.3* 20.9* 21.9* 17.4*  NEUTROABS 12.0*  --   --   --   HGB 11.7* 10.8* 10.6* 10.7*  HCT 34.3* 32.5* 31.3* 29.9*  MCV 90.9 91.9 91.8 91.1  PLT 135* 118* 116* 112*     Recent Results (from the past 240 hour(s))  Culture, blood (Routine x 2)     Status: Abnormal (Preliminary result)   Collection Time: 04/12/18  6:37 AM  Result Value Ref Range Status   Specimen Description   Final  BLOOD RIGHT ARM Performed at [facility], [place] [address] [place], [place] [address]    Special Requests   Final    BOTTLES DRAWN AEROBIC AND ANAEROBIC Blood Culture adequate volume Performed at [facility], [address] [place], [place] [address]    Culture  Setup Time   Final    GRAM POSITIVE RODS ANAEROBIC BOTTLE ONLY CRITICAL RESULT CALLED TO, READ BACK BY AND VERIFIED WITH: DAVID BESANTI AT 2358 04/12/18.PMH Performed at [facility], [address] [place], [place] [address]    Culture CLOSTRIDIUM SPECIES (A)  Final   Report Status PENDING  Incomplete  Culture, blood (Routine x 2)     Status: None (Preliminary result)   Collection Time: 04/12/18  6:39 AM  Result Value Ref Range Status   Specimen Description BLOOD RIGHT HAND  Final   Special Requests    Final    BOTTLES DRAWN AEROBIC AND ANAEROBIC Blood Culture adequate volume   Culture  Setup Time   Final    ANAEROBIC BOTTLE ONLY GRAM POSITIVE RODS CRITICAL VALUE NOTED.  VALUE IS CONSISTENT WITH PREVIOUSLY REPORTED AND CALLED VALUE. Performed at [facility], [address] [place], [place] [address]    Culture GRAM POSITIVE RODS  Final   Report Status PENDING  Incomplete  Gastrointestinal Panel by PCR , Stool     Status: Abnormal   Collection Time: 04/12/18  7:02 AM  Result Value Ref Range Status   Campylobacter species NOT DETECTED NOT DETECTED Final   Plesimonas shigelloides NOT DETECTED NOT DETECTED Final   Salmonella species NOT DETECTED NOT DETECTED Final   Yersinia enterocolitica NOT DETECTED NOT DETECTED Final   Vibrio species NOT DETECTED NOT DETECTED Final   Vibrio cholerae NOT DETECTED NOT DETECTED Final   Enteroaggregative E coli (EAEC) NOT DETECTED NOT DETECTED Final   Enteropathogenic E coli (EPEC) DETECTED (A) NOT DETECTED Final    Comment: RESULT CALLED TO, READ BACK BY AND VERIFIED WITH: MATTHEW WRIGHT 04/12/18 @ 1751  MLK    Enterotoxigenic E coli (ETEC) NOT DETECTED NOT DETECTED Final   Shiga like toxin producing E coli (STEC) NOT DETECTED NOT DETECTED Final   Shigella/Enteroinvasive E coli (EIEC) NOT DETECTED NOT DETECTED Final   Cryptosporidium NOT DETECTED NOT DETECTED Final   Cyclospora cayetanensis NOT DETECTED NOT DETECTED Final   Entamoeba histolytica NOT DETECTED NOT DETECTED Final   Giardia lamblia NOT DETECTED NOT DETECTED Final   Adenovirus F40/41 NOT DETECTED NOT DETECTED Final   Astrovirus NOT DETECTED NOT DETECTED Final   Norovirus GI/GII NOT DETECTED NOT DETECTED Final   Rotavirus A NOT DETECTED NOT DETECTED Final   Sapovirus (I, II, IV, and V) NOT DETECTED NOT DETECTED Final    Comment: Performed at [facility], [address] [place], [place] [address]  C difficile quick scan w PCR reflex     Status: Abnormal    Collection Time: 04/12/18  7:02 AM  Result Value Ref Range Status   C Diff antigen POSITIVE (A) NEGATIVE Final   C Diff toxin NEGATIVE NEGATIVE Final   C Diff interpretation Results are indeterminate. See PCR results.  Final    Comment: Performed at [facility], [address] [place], [place] [address]  C. Diff by PCR, Reflexed     Status: None   Collection Time: 04/12/18  7:02 AM  Result Value Ref Range Status   Toxigenic C. Difficile by PCR NEGATIVE NEGATIVE Final    Comment:  Patient is colonized with non toxigenic C. difficile. May not need treatment unless significant symptoms are present. Performed at [facility], [address] [place], [place] [address]   MRSA PCR Screening     Status: None   Collection Time: 04/12/18  7:00 PM  Result Value Ref Range Status   MRSA by PCR NEGATIVE NEGATIVE Final    Comment:        The GeneXpert MRSA Assay (FDA approved for NASAL specimens only), is one component of a comprehensive MRSA colonization surveillance program. It is not intended to diagnose MRSA infection nor to guide or monitor treatment for MRSA infections. Performed at [facility], [address] [place], [place] [address]       Scheduled Meds: . amLODipine  5 mg Oral Daily  . Chlorhexidine Gluconate Cloth  6 each Topical Q0600  . epoetin (EPOGEN/PROCRIT) injection  4,000 Units Intravenous Q M,W,F-HD  . multivitamin  1 tablet Oral Daily  . sodium chloride flush  3 mL Intravenous Q12H   Continuous Infusions: . sodium chloride Stopped (04/15/18 0300)  . ciprofloxacin 400 mg (04/15/18 0940)  . famotidine (PEPCID) IV 20 mg (04/14/18 1412)  . vancomycin 1,000 mg (04/15/18 1308)    Assessment/Plan:  1. Abdominal pain, nausea.  Stool culture growing out enteropathogenic E. Coli.  Patient on Cipro.  Empiric Pepcid. 2. Positive blood culture of gram positive rod and leukocytosis.  Cover with IV vancomycin.  Infectious disease consultation  today 3. End-stage renal disease on hemodialysis Monday Wednesday Friday 4. Inflammation seen on the lower aorta and iliac arteries.  Case discussed with Dr. Precious Reel and he mentioned treat infection and then reassess down the line.  No blood clots seen on CT scan.  Code Status:     Code Status Orders  (From admission, onward)         Start     Ordered   04/12/18 1311  Do not attempt resuscitation (DNR)  Continuous    Question Answer Comment  In the event of cardiac or respiratory ARREST Do not call a "code blue"   In the event of cardiac or respiratory ARREST Do not perform Intubation, CPR, defibrillation or ACLS   In the event of cardiac or respiratory ARREST Use medication by any route, position, wound care, and other measures to relive pain and suffering. May use oxygen, suction and manual treatment of airway obstruction as needed for comfort.      04/12/18 1310        Code Status History    Date Active Date Inactive Code Status Order ID Comments User Context   04/12/2018 0827 04/12/2018 1310 DNR [id number]  Dustin Flock, MD ED   08/17/2016 0352 08/18/2016 2236 Full Code [id number]  Saundra Shelling, MD Inpatient   01/12/2016 1417 01/12/2016 1754 Full Code [id number]  Algernon Huxley, MD Inpatient      Disposition Plan: To be determined based on clinical course  Consultants:  Vascular surgery  Nephrology  Infectious disease  Time spent: 27 minutes. Spoke with patient's daughter on the phone  Donella Pascarella [employer]

## 2018-04-15 NOTE — Progress Notes (Signed)
Pre HD assessment    04/15/18 1540  Neurological  Level of Consciousness Alert  Orientation Level Oriented X4  Respiratory  Respiratory Pattern Regular;Unlabored  Chest Assessment Chest expansion symmetrical  Cardiac  Pulse Regular  ECG Monitor Yes  Cardiac Rhythm NSR  Vascular  R Radial Pulse +2  L Radial Pulse +2  Edema Generalized;Left lower extremity  Integumentary  Integumentary (WDL) X  Skin Color Appropriate for ethnicity  Musculoskeletal  Musculoskeletal (WDL) X  Generalized Weakness Yes  Assistive Device None  GU Assessment  Genitourinary (WDL) X  Genitourinary Symptoms  (HD)  Psychosocial  Psychosocial (WDL) WDL

## 2018-04-16 DIAGNOSIS — R011 Cardiac murmur, unspecified: Secondary | ICD-10-CM

## 2018-04-16 DIAGNOSIS — A048 Other specified bacterial intestinal infections: Secondary | ICD-10-CM

## 2018-04-16 DIAGNOSIS — E1122 Type 2 diabetes mellitus with diabetic chronic kidney disease: Secondary | ICD-10-CM

## 2018-04-16 DIAGNOSIS — I776 Arteritis, unspecified: Secondary | ICD-10-CM

## 2018-04-16 DIAGNOSIS — A0472 Enterocolitis due to Clostridium difficile, not specified as recurrent: Secondary | ICD-10-CM

## 2018-04-16 LAB — BASIC METABOLIC PANEL
Anion gap: 9 (ref 5–15)
BUN: 26 mg/dL — ABNORMAL HIGH (ref 8–23)
CO2: 32 mmol/L (ref 22–32)
Calcium: 8 mg/dL — ABNORMAL LOW (ref 8.9–10.3)
Chloride: 97 mmol/L — ABNORMAL LOW (ref 98–111)
Creatinine, Ser: 6.59 mg/dL — ABNORMAL HIGH (ref 0.61–1.24)
GFR calc Af Amer: 8 mL/min — ABNORMAL LOW (ref >60)
GFR calc non Af Amer: 7 mL/min — ABNORMAL LOW (ref >60)
Glucose, Bld: 108 mg/dL — ABNORMAL HIGH (ref 70–99)
Potassium: 3.8 mmol/L (ref 3.5–5.1)
Sodium: 138 mmol/L (ref 135–145)

## 2018-04-16 LAB — CULTURE, BLOOD (ROUTINE X 2)
Special Requests: ADEQUATE
Special Requests: ADEQUATE

## 2018-04-16 LAB — CBC
HCT: 29.4 % — ABNORMAL LOW (ref 40.0–52.0)
Hemoglobin: 10.2 g/dL — ABNORMAL LOW (ref 13.0–18.0)
MCH: 32.3 pg (ref 26.0–34.0)
MCHC: 34.6 g/dL (ref 32.0–36.0)
MCV: 93.3 fL (ref 80.0–100.0)
Platelets: 113 10*3/uL — ABNORMAL LOW (ref 150–440)
RBC: 3.15 MIL/uL — ABNORMAL LOW (ref 4.40–5.90)
RDW: 14.3 % (ref 11.5–14.5)
WBC: 12.2 10*3/uL — ABNORMAL HIGH (ref 3.8–10.6)

## 2018-04-16 LAB — C-REACTIVE PROTEIN: CRP: 11.4 mg/dL — ABNORMAL HIGH (ref <1.0)

## 2018-04-16 MED ORDER — CALCIUM CARBONATE ANTACID 500 MG PO CHEW
1.0000 | CHEWABLE_TABLET | Freq: Two times a day (BID) | ORAL | Status: DC | PRN
  Filled 2018-04-16: qty 1

## 2018-04-16 NOTE — Progress Notes (Signed)
Patient ID: Jonathan Mcgrath, male   DOB: 28-Nov-1940, 77 y.o.   MRN: [id number]  [employer] [facility] PROGRESS NOTE  Jonathan Mcgrath:[id number] DOB: Mar 14, 1941 DOA: 04/12/2018 PCP: [name], MD  HPI/Subjective: Patient still having a little abdominal pain.  Still having some diarrhea.  Some nausea.  But was tolerating diet.  Objective: Vitals:   04/16/18 0503 04/16/18 0808  BP: (!) 150/73 (!) 161/82  Pulse: 80 79  Resp:  20  Temp: 98.8 F (37.1 C) 97.8 F (36.6 C)  SpO2: 100% 99%    Filed Weights   04/12/18 0628 04/15/18 1539 04/15/18 1938  Weight: 90.8 kg 95.8 kg 96 kg    ROS: Review of Systems  Constitutional: Negative for chills and fever.  Eyes: Negative for blurred vision.  Respiratory: Negative for cough and shortness of breath.   Cardiovascular: Negative for chest pain.  Gastrointestinal: Positive for abdominal pain, diarrhea and nausea. Negative for constipation and vomiting.  Genitourinary: Negative for dysuria.  Musculoskeletal: Negative for joint pain.  Neurological: Negative for dizziness and headaches.   Exam: Physical Exam  Constitutional: He is oriented to person, place, and time.  HENT:  Nose: No mucosal edema.  Mouth/Throat: No oropharyngeal exudate or posterior oropharyngeal edema.  Eyes: Pupils are equal, round, and reactive to light. Conjunctivae, EOM and lids are normal.  Neck: No JVD present. Carotid bruit is not present. No edema present. No thyroid mass and no thyromegaly present.  Cardiovascular: S1 normal and S2 normal. Exam reveals no gallop.  No murmur heard. Pulses:      Dorsalis pedis pulses are 2+ on the right side, and 2+ on the left side.  Respiratory: No respiratory distress. He has no wheezes. He has no rhonchi. He has no rales.  GI: Soft. Bowel sounds are normal. There is tenderness.  Musculoskeletal:       Right ankle: He exhibits swelling.       Left ankle: He exhibits swelling.  Lymphadenopathy:    He has no cervical  adenopathy.  Neurological: He is alert and oriented to person, place, and time. No cranial nerve deficit.  Skin: Skin is warm. No rash noted. Nails show no clubbing.  Psychiatric: He has a normal mood and affect.      Data Reviewed: Basic Metabolic Panel: Recent Labs  Lab 04/12/18 0639 04/12/18 1323 04/13/18 0156 04/15/18 1614 04/16/18 0319  NA 141  --  139  --  138  K 4.5  --  3.8  --  3.8  CL 97*  --  98  --  97*  CO2 27  --  32  --  32  GLUCOSE 114*  --  98  --  108*  BUN 50*  --  23  --  26*  CREATININE 10.26*  --  5.89*  --  6.59*  CALCIUM 8.8*  --  8.4*  --  8.0*  PHOS  --  5.5*  --  5.5*  --    Liver Function Tests: Recent Labs  Lab 04/12/18 0639  AST 24  ALT 17  ALKPHOS 77  BILITOT 1.2  PROT 6.7  ALBUMIN 3.7   CBC: Recent Labs  Lab 04/12/18 0639 04/13/18 0156 04/13/18 0511 04/14/18 0457 04/15/18 1614 04/16/18 0319  WBC 24.3* 20.9* 21.9* 17.4* 14.6* 12.2*  NEUTROABS 12.0*  --   --   --   --   --   HGB 11.7* 10.8* 10.6* 10.7* 10.3* 10.2*  HCT 34.3* 32.5* 31.3* 29.9* 31.0* 29.4*  MCV 90.9 91.9 91.8 91.1 93.4 93.3  PLT 135* 118* 116* 112* 130* 113*     Recent Results (from the past 240 hour(s))  Culture, blood (Routine x 2)     Status: Abnormal   Collection Time: 04/12/18  6:37 AM  Result Value Ref Range Status   Specimen Description   Final    BLOOD RIGHT ARM Performed at [facility], [place] [address] [place], [place] [address]    Special Requests   Final    BOTTLES DRAWN AEROBIC AND ANAEROBIC Blood Culture adequate volume Performed at [facility], [address] [place], [place] [address]    Culture  Setup Time   Final    GRAM POSITIVE RODS ANAEROBIC BOTTLE ONLY CRITICAL RESULT CALLED TO, READ BACK BY AND VERIFIED WITH: DAVID BESANTI AT 2358 04/12/18.PMH Performed at [facility], [address] [place], [place] [address]    Culture CLOSTRIDIUM SPECIES (A)  Final   Report Status 04/16/2018 FINAL  Final   Culture, blood (Routine x 2)     Status: Abnormal   Collection Time: 04/12/18  6:39 AM  Result Value Ref Range Status   Specimen Description   Final    BLOOD RIGHT HAND Performed at [facility], [address], [place], [place] [address]    Special Requests   Final    BOTTLES DRAWN AEROBIC AND ANAEROBIC Blood Culture adequate volume Performed at [facility], [address] [place], [place] [address]    Culture  Setup Time   Final    ANAEROBIC BOTTLE ONLY GRAM POSITIVE RODS CRITICAL VALUE NOTED.  VALUE IS CONSISTENT WITH PREVIOUSLY REPORTED AND CALLED VALUE. Performed at [facility], [address] [place], [place] [address]    Culture CLOSTRIDIUM SEPTICUM (A)  Final   Report Status 04/16/2018 FINAL  Final  Gastrointestinal Panel by PCR , Stool     Status: Abnormal   Collection Time: 04/12/18  7:02 AM  Result Value Ref Range Status   Campylobacter species NOT DETECTED NOT DETECTED Final   Plesimonas shigelloides NOT DETECTED NOT DETECTED Final   Salmonella species NOT DETECTED NOT DETECTED Final   Yersinia enterocolitica NOT DETECTED NOT DETECTED Final   Vibrio species NOT DETECTED NOT DETECTED Final   Vibrio cholerae NOT DETECTED NOT DETECTED Final   Enteroaggregative E coli (EAEC) NOT DETECTED NOT DETECTED Final   Enteropathogenic E coli (EPEC) DETECTED (A) NOT DETECTED Final    Comment: RESULT CALLED TO, READ BACK BY AND VERIFIED WITH: MATTHEW WRIGHT 04/12/18 @ 1751  MLK    Enterotoxigenic E coli (ETEC) NOT DETECTED NOT DETECTED Final   Shiga like toxin producing E coli (STEC) NOT DETECTED NOT DETECTED Final   Shigella/Enteroinvasive E coli (EIEC) NOT DETECTED NOT DETECTED Final   Cryptosporidium NOT DETECTED NOT DETECTED Final   Cyclospora cayetanensis NOT DETECTED NOT DETECTED Final   Entamoeba histolytica NOT DETECTED NOT DETECTED Final   Giardia lamblia NOT DETECTED NOT DETECTED Final   Adenovirus F40/41 NOT DETECTED NOT DETECTED Final    Astrovirus NOT DETECTED NOT DETECTED Final   Norovirus GI/GII NOT DETECTED NOT DETECTED Final   Rotavirus A NOT DETECTED NOT DETECTED Final   Sapovirus (I, II, IV, and V) NOT DETECTED NOT DETECTED Final    Comment: Performed at [facility], [address] [place], [place] [address]  C difficile quick scan w PCR reflex     Status: Abnormal   Collection Time: 04/12/18  7:02 AM  Result Value Ref Range Status   C Diff antigen POSITIVE (A) NEGATIVE Final   C Diff toxin NEGATIVE NEGATIVE Final   C Diff interpretation Results are indeterminate. See PCR results.  Final    Comment: Performed at [facility], [address] [place], [place] [address]  C. Diff by PCR, Reflexed     Status: None   Collection Time: 04/12/18  7:02 AM  Result Value Ref Range Status   Toxigenic C. Difficile by PCR NEGATIVE NEGATIVE Final    Comment: Patient is colonized with non toxigenic C. difficile. May not need treatment unless significant symptoms are present. Performed at [facility], [address] [place], [place] [address]   MRSA PCR Screening     Status: None   Collection Time: 04/12/18  7:00 PM  Result Value Ref Range Status   MRSA by PCR NEGATIVE NEGATIVE Final    Comment:        The GeneXpert MRSA Assay (FDA approved for NASAL specimens only), is one component of a comprehensive MRSA colonization surveillance program. It is not intended to diagnose MRSA infection nor to guide or monitor treatment for MRSA infections. Performed at [facility], [address] [place], [place] [address]       Scheduled Meds: . amLODipine  5 mg Oral Daily  . Chlorhexidine Gluconate Cloth  6 each Topical Q0600  . epoetin (EPOGEN/PROCRIT) injection  4,000 Units Intravenous Q M,W,F-HD  . multivitamin  1 tablet Oral Daily  . sodium chloride flush  3 mL Intravenous Q12H   Continuous Infusions: . sodium chloride 250 mL (04/15/18 2113)  . ciprofloxacin 400 mg (04/16/18  0849)  . famotidine (PEPCID) IV 20 mg (04/14/18 1412)  . metronidazole 500 mg (04/16/18 0526)  . vancomycin 1,000 mg (04/15/18 1308)    Assessment/Plan:  1. Clostridium bacteremia.  Leukocytosis has improved.  Awaiting ID full consultation.  Patient on Flagyl and vancomycin currently.  I consulted gastroenterology for GI work-up and place the patient on clear liquid diet.   2. Abdominal pain, nausea.  Stool culture growing out enteropathogenic E. Coli.  Patient on Cipro.  Empiric Pepcid. 3. End-stage renal disease on hemodialysis Monday Wednesday Friday 4. Inflammation seen on the lower aorta and iliac arteries.  Case discussed with Dr. Precious Reel and he mentioned treat infection and then reassess down the line.  No blood clots seen on CT scan.  Code Status:     Code Status Orders  (From admission, onward)         Start     Ordered   04/12/18 1311  Do not attempt resuscitation (DNR)  Continuous    Question Answer Comment  In the event of cardiac or respiratory ARREST Do not call a "code blue"   In the event of cardiac or respiratory ARREST Do not perform Intubation, CPR, defibrillation or ACLS   In the event of cardiac or respiratory ARREST Use medication by any route, position, wound care, and other measures to relive pain and suffering. May use oxygen, suction and manual treatment of airway obstruction as needed for comfort.      04/12/18 1310        Code Status History    Date Active Date Inactive Code Status Order ID Comments User Context   04/12/2018 0827 04/12/2018 1310 DNR [id number]  Dustin Flock, MD ED   08/17/2016 0352 08/18/2016 2236 Full Code [id number]  Saundra Shelling, MD Inpatient   01/12/2016 1417 01/12/2016 1754 Full Code [id number]  Algernon Huxley, MD Inpatient      Disposition Plan: To be determined based infectious disease specialist recommendations and gastroenterology recommendations  Consultants:  Vascular surgery  Nephrology  Infectious  disease  Gastroenterology  Time spent: 26 minutes. Spoke with patient's daughter on the phone  Antwain Caliendo [employer]

## 2018-04-16 NOTE — Progress Notes (Signed)
Pharmacy Antibiotic Note  Jonathan Mcgrath is a 77 y.o. male admitted on 04/12/2018 with GPR growing in blood.    Pharmacy has been consulted for Vancomycin dosing.  Plan: Pt on HD MWF.  Pt received 1 gm IV vancomycin on 9/27 @ 0800  Will order Vancomycin 2 gm IV X 1 loading dose for 9/28 - given 9/29.  Continue vancomycin 1 g IV Q MWF - HD. Will draw 1st Vanc trough before 3rd HD session on 10/4.   ID consult pending  Height: 6\' 1"  (185.4 cm) Weight: 211 lb 10.3 oz (96 kg) IBW/kg (Calculated) : 79.9  Temp (24hrs), Avg:98.5 F (36.9 C), Min:97.8 F (36.6 C), Max:98.8 F (37.1 C)  Recent Labs  Lab 04/12/18 0639 04/12/18 0842 04/13/18 0156 04/13/18 0511 04/14/18 0457 04/15/18 1614 04/16/18 0319  WBC 24.3*  --  20.9* 21.9* 17.4* 14.6* 12.2*  CREATININE 10.26*  --  5.89*  --   --   --  6.59*  LATICACIDVEN 2.4* 1.3  --   --   --   --   --     Estimated Creatinine Clearance: 11.5 mL/min (A) (by C-G formula based on SCr of 6.59 mg/dL (H)).    No Known Allergies     Thank you for allowing pharmacy to be a part of this patient's care.  Rocky Morel 04/16/2018 9:11 AM

## 2018-04-16 NOTE — Progress Notes (Signed)
[facility], [place] 04/16/18  Subjective:  Patient seen at bedside. Patient will be due for dialysis again tomorrow. Tolerated dialysis well yesterday.   Objective:  Vital signs in last 24 hours:  Temp:  [97.8 F (36.6 C)-98.8 F (37.1 C)] 97.8 F (36.6 C) (10/01 0808) Pulse Rate:  [70-83] 79 (10/01 0808) Resp:  [15-26] 20 (10/01 0808) BP: (134-191)/(68-104) 161/82 (10/01 0808) SpO2:  [93 %-100 %] 99 % (10/01 0808) Weight:  [95.8 kg-96 kg] 96 kg (09/30 1938)  Weight change:  Filed Weights   04/12/18 0628 04/15/18 1539 04/15/18 1938  Weight: 90.8 kg 95.8 kg 96 kg    Intake/Output:    Intake/Output Summary (Last 24 hours) at 04/16/2018 1422 Last data filed at 04/15/2018 2117 Gross per 24 hour  Intake 10 ml  Output 8 ml  Net 2 ml     Physical Exam: General:  No acute distress  HEENT  anicteric, moist oral mucous membranes  Neck  supple  Pulm/lungs  normal breathing effort, room air, CTAB  CVS/Heart  regular  Abdomen:   Nontender, BS present  Extremities:  trace peripheral edema  Neurologic:  Alert, oriented, follows commands  Skin:  No acute rashes  Access:  Left forearm AV fistula       Basic Metabolic Panel:  Recent Labs  Lab 04/12/18 0639 04/12/18 1323 04/13/18 0156 04/15/18 1614 04/16/18 0319  NA 141  --  139  --  138  K 4.5  --  3.8  --  3.8  CL 97*  --  98  --  97*  CO2 27  --  32  --  32  GLUCOSE 114*  --  98  --  108*  BUN 50*  --  23  --  26*  CREATININE 10.26*  --  5.89*  --  6.59*  CALCIUM 8.8*  --  8.4*  --  8.0*  PHOS  --  5.5*  --  5.5*  --      CBC: Recent Labs  Lab 04/12/18 0639 04/13/18 0156 04/13/18 0511 04/14/18 0457 04/15/18 1614 04/16/18 0319  WBC 24.3* 20.9* 21.9* 17.4* 14.6* 12.2*  NEUTROABS 12.0*  --   --   --   --   --   HGB 11.7* 10.8* 10.6* 10.7* 10.3* 10.2*  HCT 34.3* 32.5* 31.3* 29.9* 31.0* 29.4*  MCV 90.9 91.9 91.8 91.1 93.4 93.3  PLT 135* 118* 116* 112* 130* 113*     No  results found for: HEPBSAG, HEPBSAB, HEPBIGM    Microbiology:  Recent Results (from the past 240 hour(s))  Culture, blood (Routine x 2)     Status: Abnormal   Collection Time: 04/12/18  6:37 AM  Result Value Ref Range Status   Specimen Description   Final    BLOOD RIGHT ARM Performed at [facility], [address], [place], [place] [address]    Special Requests   Final    BOTTLES DRAWN AEROBIC AND ANAEROBIC Blood Culture adequate volume Performed at [facility], [address] [place], [place] [address]    Culture  Setup Time   Final    GRAM POSITIVE RODS ANAEROBIC BOTTLE ONLY CRITICAL RESULT CALLED TO, READ BACK BY AND VERIFIED WITH: DAVID BESANTI AT 2358 04/12/18.PMH Performed at [facility], [address] [place], [place] [address]    Culture CLOSTRIDIUM SPECIES (A)  Final   Report Status 04/16/2018 FINAL  Final  Culture, blood (Routine x 2)  Status: Abnormal   Collection Time: 04/12/18  6:39 AM  Result Value Ref Range Status   Specimen Description   Final    BLOOD RIGHT HAND Performed at [facility], [address] [place], [place] [address]    Special Requests   Final    BOTTLES DRAWN AEROBIC AND ANAEROBIC Blood Culture adequate volume Performed at [facility], [address] [place], [place] [address]    Culture  Setup Time   Final    ANAEROBIC BOTTLE ONLY GRAM POSITIVE RODS CRITICAL VALUE NOTED.  VALUE IS CONSISTENT WITH PREVIOUSLY REPORTED AND CALLED VALUE. Performed at [facility], [address] [place], [place] [address]    Culture CLOSTRIDIUM SEPTICUM (A)  Final   Report Status 04/16/2018 FINAL  Final  Gastrointestinal Panel by PCR , Stool     Status: Abnormal   Collection Time: 04/12/18  7:02 AM  Result Value Ref Range Status   Campylobacter species NOT DETECTED NOT DETECTED Final   Plesimonas shigelloides NOT DETECTED NOT DETECTED Final   Salmonella species NOT DETECTED NOT DETECTED  Final   Yersinia enterocolitica NOT DETECTED NOT DETECTED Final   Vibrio species NOT DETECTED NOT DETECTED Final   Vibrio cholerae NOT DETECTED NOT DETECTED Final   Enteroaggregative E coli (EAEC) NOT DETECTED NOT DETECTED Final   Enteropathogenic E coli (EPEC) DETECTED (A) NOT DETECTED Final    Comment: RESULT CALLED TO, READ BACK BY AND VERIFIED WITH: MATTHEW WRIGHT 04/12/18 @ 1751  MLK    Enterotoxigenic E coli (ETEC) NOT DETECTED NOT DETECTED Final   Shiga like toxin producing E coli (STEC) NOT DETECTED NOT DETECTED Final   Shigella/Enteroinvasive E coli (EIEC) NOT DETECTED NOT DETECTED Final   Cryptosporidium NOT DETECTED NOT DETECTED Final   Cyclospora cayetanensis NOT DETECTED NOT DETECTED Final   Entamoeba histolytica NOT DETECTED NOT DETECTED Final   Giardia lamblia NOT DETECTED NOT DETECTED Final   Adenovirus F40/41 NOT DETECTED NOT DETECTED Final   Astrovirus NOT DETECTED NOT DETECTED Final   Norovirus GI/GII NOT DETECTED NOT DETECTED Final   Rotavirus A NOT DETECTED NOT DETECTED Final   Sapovirus (I, II, IV, and V) NOT DETECTED NOT DETECTED Final    Comment: Performed at [facility] [address] [place], [place] [address]  C difficile quick scan w PCR reflex     Status: Abnormal   Collection Time: 04/12/18  7:02 AM  Result Value Ref Range Status   C Diff antigen POSITIVE (A) NEGATIVE Final   C Diff toxin NEGATIVE NEGATIVE Final   C Diff interpretation Results are indeterminate. See PCR results.  Final    Comment: Performed at [facility], [address] [place], [place] [address]  C. Diff by PCR, Reflexed     Status: None   Collection Time: 04/12/18  7:02 AM  Result Value Ref Range Status   Toxigenic C. Difficile by PCR NEGATIVE NEGATIVE Final    Comment: Patient is colonized with non toxigenic C. difficile. May not need treatment unless significant symptoms are present. Performed at [facility], [address] [place],   [address]   MRSA PCR Screening     Status: None   Collection Time: 04/12/18  7:00 PM  Result Value Ref Range Status   MRSA by PCR NEGATIVE NEGATIVE Final    Comment:        The GeneXpert MRSA Assay (FDA approved for NASAL specimens only), is one component of a comprehensive MRSA colonization  surveillance program. It is not intended to diagnose MRSA infection nor to guide or monitor treatment for MRSA infections. Performed at [facility], [address] [place], [place] [address]     Coagulation Studies: No results for input(s): LABPROT, INR in the last 72 hours.  Urinalysis: No results for input(s): COLORURINE, LABSPEC, PHURINE, GLUCOSEU, HGBUR, BILIRUBINUR, KETONESUR, PROTEINUR, UROBILINOGEN, NITRITE, LEUKOCYTESUR in the last 72 hours.  Invalid input(s): APPERANCEUR    Imaging: Ct Angio Abd/pel W/ And/or W/o  Result Date: 04/15/2018 CLINICAL DATA:  77 year old male with nausea, vomiting, diarrhea and stool cultures positive for enteropathogenic E coli. EXAM: CT ANGIOGRAPHY ABDOMEN AND PELVIS WITH CONTRAST AND WITHOUT CONTRAST TECHNIQUE: Multidetector CT imaging of the abdomen and pelvis was performed using the standard protocol during bolus administration of intravenous contrast. Multiplanar reconstructed images and MIPs were obtained and reviewed to evaluate the vascular anatomy. CONTRAST:  16mL ISOVUE-370 IOPAMIDOL (ISOVUE-370) INJECTION 76% COMPARISON:  CT scan of the abdomen and pelvis 04/12/2018 FINDINGS: VASCULAR Aorta: Mild heterogeneous atherosclerotic plaque. No evidence of aneurysm or dissection. Celiac: Patent without evidence of aneurysm, dissection, vasculitis or significant stenosis. SMA: Patent without evidence of aneurysm, dissection, vasculitis or significant stenosis. Renals: Both renal arteries are patent without evidence of aneurysm, dissection, vasculitis, fibromuscular dysplasia or significant stenosis. IMA: Patent without evidence of aneurysm,  dissection, vasculitis or significant stenosis. Inflow: Circumferential arterial wall thickening beginning at the aortic bifurcation and extending along both common iliac arteries, particularly the left. On the right, the inflammatory changes. Prior to the bifurcation. On the left, the inflammatory changes extend beyond the bifurcation and along the external iliac artery for a short distance. No significant stenosis, dissection or aneurysm. There is mild atherosclerotic plaque and irregularity along the left common iliac artery. Moderate focal plaque along the right common iliac artery. The external iliac arteries are relatively spared from disease. Proximal Outflow: Bilateral common femoral and visualized portions of the superficial and profunda femoral arteries are patent without evidence of aneurysm, dissection, vasculitis or significant stenosis. Veins: Venous opacification of the IVC and iliac veins is relatively limited on the portal venous phase imaging. No definite thrombus or asymmetry identified. The hepatic, portal and renal veins are all widely patent as are the splanchnic veins. Review of the MIP images confirms the above findings. NON-VASCULAR Lower chest: Incompletely imaged aortic valvular calcifications. Cardiomegaly. No pericardial effusion. Trace left-sided pleural effusion and associated mild atelectasis. No pulmonary edema or focal airspace consolidation. Mild scarring in the inferior right middle lobe. Moderately large sliding hiatal hernia. Hepatobiliary: Geographic hypoattenuation in the left hemi-liver adjacent to the fissure for the falciform ligament is nonspecific but most suggestive of benign focal fatty infiltration. Otherwise, no solid hepatic lesion. Subtle gallstones were better visualized on the recent noncontrast enhanced CT scan. No intra or extrahepatic biliary ductal dilatation. Pancreas: Unremarkable. No pancreatic ductal dilatation or surrounding inflammatory changes. Spleen:  Normal in size without focal abnormality. Adrenals/Urinary Tract: Normal adrenal glands. 2.3 cm nonenhancing simple cyst centrally in the upper pole of the left kidney. Punctate nonobstructing right lower pole nephrolithiasis again noted. No evidence of hydronephrosis. The left ureter passes through the inflammatory changes overlying the left iliac vessels without evidence of external compression. The right ureter is unremarkable. The bladder is within normal limits. Stomach/Bowel: No evidence of obstruction or focal bowel wall thickening. Normal appendix in the right lower quadrant. The terminal ileum is unremarkable. Lymphatic: No suspicious lymphadenopathy. Reproductive: Prostatomegaly. Other: Small fat containing umbilical hernia. a No abdominopelvic ascites. Musculoskeletal:  No acute fracture or aggressive appearing lytic or blastic osseous lesion. Multilevel degenerative disc disease. There is a linear tract extending from the skin surface inferiorly over the left anterior abdominal wall. A small peripherally high density nodular region is present just superficial to the left rectus abdominus muscle. Given the configuration, this likely represents chronic changes related to the presence of a prior peritoneal dialysis catheter. IMPRESSION: VASCULAR 1. Circumferential arterial wall thickening beginning at the aortic bifurcation and extending along the left greater than right common iliac arteries. There are associated inflammatory changes with inflammatory stranding in the immediately adjacent retroperitoneal fat and left pelvic sidewall. 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 Recommend vascular surgery consultation if not already obtained. 2. No evidence of deep venous thrombosis at this time. 3. Aortic Atherosclerosis (ICD10-170.0). 4. Cardiomegaly. 5. Aortic valvular calcifications. NON-VASCULAR 1. No acute nonvascular  abnormality within the abdomen or pelvis. 2. Nonobstructing right lower pole nephrolithiasis. 3. Left upper pole simple renal cyst. 4. Trace left-sided pleural effusion. 5. Multilevel degenerative disc disease. 6. Small umbilical hernia. 7. Moderate hiatal hernia. 8. Additional ancillary findings as above. Signed, Criselda Peaches, MD, [facility] Electronically Signed   By: Jacqulynn Cadet M.D.   On: 04/15/2018 09:55     Medications:   . sodium chloride 250 mL (04/15/18 2113)  . ciprofloxacin 400 mg (04/16/18 0849)  . famotidine (PEPCID) IV 20 mg (04/14/18 1412)  . metronidazole 500 mg (04/16/18 0526)  . vancomycin 1,000 mg (04/15/18 1308)   . amLODipine  5 mg Oral Daily  . Chlorhexidine Gluconate Cloth  6 each Topical Q0600  . epoetin (EPOGEN/PROCRIT) injection  4,000 Units Intravenous Q M,W,F-HD  . multivitamin  1 tablet Oral Daily  . sodium chloride flush  3 mL Intravenous Q12H   sodium chloride, acetaminophen **OR** acetaminophen, calcium carbonate, hydrALAZINE, HYDROcodone-acetaminophen, ondansetron **OR** ondansetron (ZOFRAN) IV, sodium chloride flush  Assessment/ Plan:  77 y.o. male  ESRD, hypertension, diabetes mellitus, GERD, anemia of CKD, SHPTH, transitioned off PD to HD 6/16.  [facility]  1.  End-stage renal disease -patient had hemodialysis yesterday.  No acute indication for dialysis today.  We will plan for dialysis again tomorrow if the patient is still here.  Otherwise he will resume normal outpatient dialysis  2.  Anemia of CKD -emoglobin stable at 10.2.  Maintain the patient on Epogen..  3.  Secondary hyperparathyroidism -phosphorus 5.5 yesterday and at target.  Suspect that dietary nonadherence has lead to high phosphorus as an outpatient.  4. Acute Gastroenteritis -C. difficile antigen positive but toxin negative. Stool positive for enteropathogenic E. coli Treated with Cipro     LOS: 4 Jonathan Mcgrath 10/1/20192:22 PM  [facility] [place], [place]  Note: This note was prepared with Dragon dictation. Any transcription errors are unintentional

## 2018-04-16 NOTE — Consult Note (Signed)
Vonda Antigua, MD [address] [place], [place], [place], [address] [address], [place], [place], [place], [address] Phone: [phone]  Fax: [phone]  Consultation  Referring Provider:     Dr. Earleen Newport Primary Care Physician:  [name], MD Reason for Consultation:    Positive Clostridium and blood cultures  Date of Admission:  04/12/2018 Date of Consultation:  04/16/2018         HPI:   JAKIAH GOREE is a 77 y.o. male admitted with diarrhea and fever and found to have positive Clostridium septicum on blood cultures and GI consulted for evaluation of colonoscopy to rule out colonic malignancy.  Stool testing positive for C. difficile antigen, negative for toxin and PCR.  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  Vascular surgery evaluated the patient and does not recommend endovascular intervention and has signed off. Patient denies any previous history of EGD or colonoscopy.  Denies any family history of colon cancer.  Patient also is in end-stage renal disease patient on hemodialysis, Monday Wednesday Friday.  Past Medical History:  Diagnosis Date  . Chronic kidney disease   . Hemodialysis access site with arteriovenous graft ([place])   . Hypertension   . Renal insufficiency     Past Surgical History:  Procedure Laterality Date  . AV FISTULA PLACEMENT Left 2015   arm  . PERIPHERAL VASCULAR CATHETERIZATION Left 01/12/2016   Procedure: A/V Shuntogram/Fistulagram;  Surgeon: Algernon Huxley, MD;  Location: [facility];  Service: Cardiovascular;  Laterality: Left;  . PERIPHERAL VASCULAR CATHETERIZATION N/A 01/12/2016   Procedure: A/V Shunt Intervention;  Surgeon: Algernon Huxley, MD;  Location: [facility];  Service: Cardiovascular;  Laterality: N/A;  . PERITONEAL CATHETER INSERTION N/A   . REMOVAL OF A DIALYSIS CATHETER N/A 01/14/2015   Procedure: REMOVAL OF A  PERITONEAL  DIALYSIS CATHETER;  Surgeon: Algernon Huxley, MD;  Location: [facility];  Service: Vascular;  Laterality: N/A;    Prior to Admission medications   Medication Sig Start Date End Date Taking? Authorizing Provider  cinacalcet (SENSIPAR) 30 MG tablet Take 30 mg by mouth every morning.   Yes [provider]  ferric citrate (AURYXIA) 1 GM 210 MG(Fe) tablet Take 1 tablet by mouth daily.   Yes [provider]  multivitamin (RENA-VIT) TABS tablet Take 1 tablet by mouth daily. 10/12/15  Yes [provider]    Family History  Problem Relation Age of Onset  . Diabetes Mother   . Heart disease Father      Social History   Tobacco Use  . Smoking status: Never Smoker  . Smokeless tobacco: Never Used  Substance Use Topics  . Alcohol use: No  . Drug use: No    Allergies as of 04/12/2018  . (No Known Allergies)    Review of Systems:    All systems reviewed and negative except where noted in HPI.   Physical Exam:  Vital signs in last 24 hours: Vitals:   04/15/18 1938 04/15/18 2000 04/16/18 0503 04/16/18 0808  BP: (!) 191/81 (!) 172/74 (!) 150/73 (!) 161/82  Pulse: 83  80 79  Resp: 19   20  Temp: 98.6 F (37 C)  98.8 F (37.1 C) 97.8 F (36.6 C)  TempSrc: Oral   Oral  SpO2: 96%  100% 99%  Weight: 96 kg     Height:       Last BM Date:  04/15/18 General:   Pleasant, cooperative in NAD Head:  Normocephalic and atraumatic. Eyes:   No icterus.   Conjunctiva pink. PERRLA. Ears:  Normal auditory acuity. Neck:  Supple; no masses or thyroidomegaly Lungs: Respirations even and unlabored. Lungs clear to auscultation bilaterally.   No wheezes, crackles, or rhonchi.  Abdomen:  Soft, nondistended, nontender. Normal bowel sounds. No appreciable masses or hepatomegaly.  No rebound or guarding.  Neurologic:  Alert and oriented x3;  grossly normal neurologically. Skin:  Intact without significant lesions or rashes. Cervical Nodes:  No significant cervical adenopathy. Psych:   Alert and cooperative. Normal affect.  LAB RESULTS: Recent Labs    04/14/18 0457 04/15/18 1614 04/16/18 0319  WBC 17.4* 14.6* 12.2*  HGB 10.7* 10.3* 10.2*  HCT 29.9* 31.0* 29.4*  PLT 112* 130* 113*   BMET Recent Labs    04/16/18 0319  NA 138  K 3.8  CL 97*  CO2 32  GLUCOSE 108*  BUN 26*  CREATININE 6.59*  CALCIUM 8.0*   LFT No results for input(s): PROT, ALBUMIN, AST, ALT, ALKPHOS, BILITOT, BILIDIR, IBILI in the last 72 hours. PT/INR No results for input(s): LABPROT, INR in the last 72 hours.  STUDIES: Ct Angio Abd/pel W/ And/or W/o  Result Date: 04/15/2018 CLINICAL DATA:  77 year old male with nausea, vomiting, diarrhea and stool cultures positive for enteropathogenic E coli. EXAM: CT ANGIOGRAPHY ABDOMEN AND PELVIS WITH CONTRAST AND WITHOUT CONTRAST TECHNIQUE: Multidetector CT imaging of the abdomen and pelvis was performed using the standard protocol during bolus administration of intravenous contrast. Multiplanar reconstructed images and MIPs were obtained and reviewed to evaluate the vascular anatomy. CONTRAST:  153m ISOVUE-370 IOPAMIDOL (ISOVUE-370) INJECTION 76% COMPARISON:  CT scan of the abdomen and pelvis 04/12/2018 FINDINGS: VASCULAR Aorta: Mild heterogeneous atherosclerotic plaque. No evidence of aneurysm or dissection. Celiac: Patent without evidence of aneurysm, dissection, vasculitis or significant stenosis. SMA: Patent without evidence of aneurysm, dissection, vasculitis or significant stenosis. Renals: Both renal arteries are patent without evidence of aneurysm, dissection, vasculitis, fibromuscular dysplasia or significant stenosis. IMA: Patent without evidence of aneurysm, dissection, vasculitis or significant stenosis. Inflow: Circumferential arterial wall thickening beginning at the aortic bifurcation and extending along both common iliac arteries, particularly the left. On the right, the inflammatory changes. Prior to the bifurcation. On the left, the  inflammatory changes extend beyond the bifurcation and along the external iliac artery for a short distance. No significant stenosis, dissection or aneurysm. There is mild atherosclerotic plaque and irregularity along the left common iliac artery. Moderate focal plaque along the right common iliac artery. The external iliac arteries are relatively spared from disease. Proximal Outflow: Bilateral common femoral and visualized portions of the superficial and profunda femoral arteries are patent without evidence of aneurysm, dissection, vasculitis or significant stenosis. Veins: Venous opacification of the IVC and iliac veins is relatively limited on the portal venous phase imaging. No definite thrombus or asymmetry identified. The hepatic, portal and renal veins are all widely patent as are the splanchnic veins. Review of the MIP images confirms the above findings. NON-VASCULAR Lower chest: Incompletely imaged aortic valvular calcifications. Cardiomegaly. No pericardial effusion. Trace left-sided pleural effusion and associated mild atelectasis. No pulmonary edema or focal airspace consolidation. Mild scarring in the inferior right middle lobe. Moderately large sliding hiatal hernia. Hepatobiliary: Geographic hypoattenuation in the left hemi-liver adjacent to the fissure for the falciform ligament is nonspecific but most suggestive of benign focal fatty infiltration. Otherwise, no solid hepatic lesion. Subtle gallstones were better visualized on the  recent noncontrast enhanced CT scan. No intra or extrahepatic biliary ductal dilatation. Pancreas: Unremarkable. No pancreatic ductal dilatation or surrounding inflammatory changes. Spleen: Normal in size without focal abnormality. Adrenals/Urinary Tract: Normal adrenal glands. 2.3 cm nonenhancing simple cyst centrally in the upper pole of the left kidney. Punctate nonobstructing right lower pole nephrolithiasis again noted. No evidence of hydronephrosis. The left ureter  passes through the inflammatory changes overlying the left iliac vessels without evidence of external compression. The right ureter is unremarkable. The bladder is within normal limits. Stomach/Bowel: No evidence of obstruction or focal bowel wall thickening. Normal appendix in the right lower quadrant. The terminal ileum is unremarkable. Lymphatic: No suspicious lymphadenopathy. Reproductive: Prostatomegaly. Other: Small fat containing umbilical hernia. a No abdominopelvic ascites. Musculoskeletal: No acute fracture or aggressive appearing lytic or blastic osseous lesion. Multilevel degenerative disc disease. There is a linear tract extending from the skin surface inferiorly over the left anterior abdominal wall. A small peripherally high density nodular region is present just superficial to the left rectus abdominus muscle. Given the configuration, this likely represents chronic changes related to the presence of a prior peritoneal dialysis catheter. IMPRESSION: VASCULAR 1. Circumferential arterial wall thickening beginning at the aortic bifurcation and extending along the left greater than right common iliac arteries. There are associated inflammatory changes with inflammatory stranding in the immediately adjacent retroperitoneal fat and left pelvic sidewall. 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 Recommend vascular surgery consultation if not already obtained. 2. No evidence of deep venous thrombosis at this time. 3. Aortic Atherosclerosis (ICD10-170.0). 4. Cardiomegaly. 5. Aortic valvular calcifications. NON-VASCULAR 1. No acute nonvascular abnormality within the abdomen or pelvis. 2. Nonobstructing right lower pole nephrolithiasis. 3. Left upper pole simple renal cyst. 4. Trace left-sided pleural effusion. 5. Multilevel degenerative disc disease. 6. Small umbilical hernia. 7. Moderate hiatal hernia. 8. Additional ancillary  findings as above. Signed, Criselda Peaches, MD, [facility] Electronically Signed   By: Jacqulynn Cadet M.D.   On: 04/15/2018 09:55      Impression / Plan:   SANDY BLOUCH is a 77 y.o. y/o male with diarrhea, positive C. difficile antigen on stool testing, positive Clostridium septicum on blood culture and GI consulted for evaluation for colonoscopy to rule out colonic malignancy given positive blood cultures  Patient has never had an EGD or colonoscopy Therefore, endoscopy indicated to rule out GI malignancy given positive sodium septicum This was discussed with the patient and he is in agreement with the procedures  However, patient is a dialysis patient and is scheduled for hemodialysis tomorrow Our anesthesiologists who sedate the patient do not recommend procedures to be done after dialysis, therefore based on this, his procedures will be scheduled for day after tomorrow and also so they do not interfere with his dialysis schedule  We will plan for EGD and colonoscopy day after tomorrow Okay to start solid food diet today Clear liquid diet all day tomorrow We will order colonoscopy prep tomorrow  I have discussed alternative options, risks & benefits,  which include, but are not limited to, bleeding, infection, perforation,respiratory complication & drug reaction.  The patient agrees with this plan & written consent will be obtained.     Thank you for involving me in the care of this patient.      LOS: 4 days   Virgel Manifold, MD  04/16/2018, 4:08 PM

## 2018-04-16 NOTE — Consult Note (Signed)
Pharmacy Antibiotic Note  Jonathan Mcgrath is a 77 y.o. male admitted on 04/12/2018 with Intra-Abdominal Infection .  GI Panel by PCR showing Enteropathogenic E Coli (EPEC) Pharmacy has been consulted for Cipro dosing. Patient has PMH of ESRD.  Plan: Continue Ciprofloxacin 400mg  every 24 hours.   Height: 6\' 1"  (185.4 cm) Weight: 211 lb 10.3 oz (96 kg) IBW/kg (Calculated) : 79.9  Temp (24hrs), Avg:98.5 F (36.9 C), Min:97.8 F (36.6 C), Max:98.8 F (37.1 C)  Recent Labs  Lab 04/12/18 0639 04/12/18 0842 04/13/18 0156 04/13/18 0511 04/14/18 0457 04/15/18 1614 04/16/18 0319  WBC 24.3*  --  20.9* 21.9* 17.4* 14.6* 12.2*  CREATININE 10.26*  --  5.89*  --   --   --  6.59*  LATICACIDVEN 2.4* 1.3  --   --   --   --   --     Estimated Creatinine Clearance: 11.5 mL/min (A) (by C-G formula based on SCr of 6.59 mg/dL (H)).    No Known Allergies  Antimicrobials this admission: 9/28 ciprofloxacin >>   Flagyl 9/30 >> vanc 9/27 >>  Microbiology results: 9/27  BCx: clostridium  9/27 GI Panel by PCR: EPEC 9/27 MRSA PCR: negative   Thank you for allowing pharmacy to be a part of this patient's care.  Rocky Morel, PharmD, BCPS Clinical Pharmacist 04/16/2018 9:09 AM

## 2018-04-16 NOTE — Progress Notes (Signed)
Assumed care of patient at 2230. Patient rested during the night. Up to bathroom this morning with BM. Tums given at patient's request for gas. Daughter called this morning for updates.

## 2018-04-16 NOTE — Consult Note (Signed)
NAME: Jonathan Mcgrath  DOB: Dec 22, 1940  MRN: [id number]  Date/Time: 04/16/2018 4:28 PM  wieting Subjective:  REASON FOR CONSULT: bacteremia ? Jonathan Mcgrath is a 77 y.o. with a history of ESRD, HTN , DM2 Admitted  due to nausea and vomiitng, diarrhea and fever with chills.  Pt was doing well on wed morning when he went for dialysis, got the flu vaccine, when he returned home he has some nausea and vomiting but had to attend a funeral. He then developed diarrhea. Stayed home on Thursday and on Friday  he went back to  dialysis and  Was shivering Found to have a temp and sent to ED. Temp of 102.1 in the ED  Had Stool tests which showed enteropathogenic ecoli, and cdiff antigen positive but negative toxin and Neg PCR. Blood cultures were sent and he was started on vanco and cefepime and flagyl .Marland Kitchen He had a CT abdomen which showed Mild inflammatory changes surrounding the inferior mesenteric artery and inferior mesenteric vein along the anterior aspect of the aorta and left common iliac artery. No focal fluid collection is noted. He had CTA on 9/30 which showed Circumferential arterial wall thickening beginning at the aortic bifurcation and extending along the left greater than right common iliac arteries. There are associated inflammatory changes with inflammatory stranding in the immediately adjacent retroperitoneal fat and left pelvic sidewall. 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 No recent travel, no recent antibiotics No hardware  PMH ESRD HTN Cataract H/o diabetes no meds since he lost weight    Past Surgical History:  Procedure Laterality Date  . AV FISTULA PLACEMENT Left 2015   arm  .  PERIPHERAL VASCULAR CATHETERIZATION Left 01/12/2016   Procedure: A/V Shuntogram/Fistulagram;  Surgeon: Algernon Huxley, MD;  Location: [facility];  Service: Cardiovascular;  Laterality: Left;  . PERIPHERAL VASCULAR CATHETERIZATION N/A 01/12/2016   Procedure: A/V Shunt Intervention;  Surgeon: Algernon Huxley, MD;  Location: [facility];  Service: Cardiovascular;  Laterality: N/A;  . PERITONEAL CATHETER INSERTION N/A   . REMOVAL OF A DIALYSIS CATHETER N/A 01/14/2015   Procedure: REMOVAL OF A  PERITONEAL DIALYSIS CATHETER;  Surgeon: Algernon Huxley, MD;  Location: [facility];  Service: Vascular;  Laterality: N/A;    SH Non smoker Son lives with him     Family History  Problem Relation Age of Onset  . Diabetes Mother   . Heart disease Father    No Known Allergies  ? Current Facility-Administered Medications  Medication Dose Route Frequency Provider Last Rate Last Dose  . 0.9 %  sodium chloride infusion  250 mL Intravenous PRN Loletha Grayer, MD 5 mL/hr at 04/15/18 2113 250 mL at 04/15/18 2113  . acetaminophen (TYLENOL) tablet 650 mg  650 mg Oral Q6H PRN Loletha Grayer, MD       Or  . acetaminophen (TYLENOL) suppository 650 mg  650 mg Rectal Q6H PRN Wieting, Richard, MD      . amLODipine (NORVASC) tablet 5 mg  5 mg Oral Daily Loletha Grayer, MD   5 mg at 04/16/18 0842  . calcium carbonate (TUMS - dosed in mg elemental calcium) chewable tablet 200-400 mg of elemental calcium  1-2 tablet  Oral BID PRN Arta Silence, MD      . Chlorhexidine Gluconate Cloth 2 % PADS 6 each  6 each Topical Q0600 Loletha Grayer, MD   6 each at 04/14/18 0615  . ciprofloxacin (CIPRO) IVPB 400 mg  400 mg Intravenous Q24H Loletha Grayer, MD 200 mL/hr at 04/16/18 0849 400 mg at 04/16/18 0849  . epoetin alfa (EPOGEN,PROCRIT) injection 4,000 Units  4,000 Units Intravenous Q M,W,F-HD Candiss Norse, Harmeet, MD      . famotidine (PEPCID) IVPB 20 mg premix  20 mg Intravenous Q24H Loletha Grayer, MD 100 mL/hr  at 04/14/18 1412 20 mg at 04/14/18 1412  . hydrALAZINE (APRESOLINE) injection 10 mg  10 mg Intravenous Q6H PRN Wieting, Richard, MD      . HYDROcodone-acetaminophen (NORCO/VICODIN) 5-325 MG per tablet 1-2 tablet  1-2 tablet Oral Q4H PRN Wieting, Richard, MD      . metroNIDAZOLE (FLAGYL) IVPB 500 mg  500 mg Intravenous Q8H Tambria Pfannenstiel, Joellyn Quails, MD 100 mL/hr at 04/16/18 1530 500 mg at 04/16/18 1530  . multivitamin (RENA-VIT) tablet 1 tablet  1 tablet Oral Daily Loletha Grayer, MD   1 tablet at 04/16/18 [phone]  . ondansetron (ZOFRAN) tablet 4 mg  4 mg Oral Q6H PRN Loletha Grayer, MD   4 mg at 04/14/18 0748   Or  . ondansetron (ZOFRAN) injection 4 mg  4 mg Intravenous Q6H PRN Loletha Grayer, MD   4 mg at 04/16/18 1521  . sodium chloride flush (NS) 0.9 % injection 3 mL  3 mL Intravenous Q12H Loletha Grayer, MD   3 mL at 04/16/18 1000  . sodium chloride flush (NS) 0.9 % injection 3 mL  3 mL Intravenous PRN Loletha Grayer, MD      . vancomycin (VANCOCIN) IVPB 1000 mg/200 mL premix  1,000 mg Intravenous Q M,W,F-HD Loletha Grayer, MD 200 mL/hr at 04/15/18 1308 1,000 mg at 04/15/18 1308    REVIEW OF SYSTEMS:  Const:  fever,  chills, negative weight loss Eyes: negative diplopia or visual changes, negative eye pain ENT: negative coryza, negative sore throat Resp: negative cough, hemoptysis, dyspnea Cards: negative for chest pain, palpitations, lower extremity edema GU: negative for frequency, dysuria and hematuria GI: abdominal discomfort , constipation, now diarrhea, No bleeding,  Skin: negative for rash and pruritus Heme: negative for easy bruising and gum/nose bleeding MS: negative for myalgias, arthralgias, back pain and muscle weakness Neurolo:negative for headaches, dizziness, vertigo, memory problems  Psych: negative for feelings of anxiety, depression  Endocrine: h/o DM -once he lost significant weight 20 yrs ago he has not been on any medication Allergy/Immunology- negative for any  medication or food allergies ? Pertinent Positives include : Objective:  VITALS:  BP (!) 161/82 (BP Location: Right Arm)   Pulse 79   Temp 97.8 F (36.6 C) (Oral)   Resp 20   Ht '6\' 1"'$  (1.854 m)   Wt 96 kg   SpO2 99%   BMI 27.92 kg/m  PHYSICAL EXAM:  General: Alert, cooperative, no distress, appears stated age.  Head: Normocephalic, without obvious abnormality, atraumatic. Eyes: Conjunctivae clear, anicteric sclerae. Pupils are equal ENT Nares normal. No drainage or sinus tenderness. Lips, mucosa, and tongue normal. No Thrush Neck: Supple, symmetrical, no adenopathy, thyroid: non tender no carotid bruit and no JVD. Back: No CVA tenderness. Lungs: Clear to auscultation bilaterally. No Wheezing or Rhonchi. No rales. Heart: s1s2, murmur heard on the rt sternal border Abdomen: Soft, non-tender,not distended. Bowel sounds normal. No masses Extremities: atraumatic, no cyanosis. No edema.  No clubbing Skin: No rashes or lesions. Or bruising Lymph: Cervical, supraclavicular normal. Neurologic: Grossly non-focal   Pertinent Labs CBC Latest Ref Rng & Units 04/16/2018 04/15/2018 04/14/2018  WBC 3.8 - 10.6 K/uL 12.2(H) 14.6(H) 17.4(H)  Hemoglobin 13.0 - 18.0 g/dL 10.2(L) 10.3(L) 10.7(L)  Hematocrit 40.0 - 52.0 % 29.4(L) 31.0(L) 29.9(L)  Platelets 150 - 440 K/uL 113(L) 130(L) 112(L)   CMP Latest Ref Rng & Units 04/16/2018 04/13/2018 04/12/2018  Glucose 70 - 99 mg/dL 108(H) 98 114(H)  BUN 8 - 23 mg/dL 26(H) 23 50(H)  Creatinine 0.61 - 1.24 mg/dL 6.59(H) 5.89(H) 10.26(H)  Sodium 135 - 145 mmol/L 138 139 141  Potassium 3.5 - 5.1 mmol/L 3.8 3.8 4.5  Chloride 98 - 111 mmol/L 97(L) 98 97(L)  CO2 22 - 32 mmol/L 32 32 27  Calcium 8.9 - 10.3 mg/dL 8.0(L) 8.4(L) 8.8(L)  Total Protein 6.5 - 8.1 g/dL - - 6.7  Total Bilirubin 0.3 - 1.2 mg/dL - - 1.2  Alkaline Phos 38 - 126 U/L - - 77  AST 15 - 41 U/L - - 24  ALT 0 - 44 U/L - - 17   bc- 9/27 clostridium septicum  IMAGING RESULTS: CT angio  abdomen and pelvis ?Circumferential arterial wall thickening beginning at the aortic bifurcation and extending along the left greater than right common iliac arteries. There are associated inflammatory changes with inflammatory stranding in the immediately adjacent retroperitoneal fat and left pelvic sidewall. 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   Impression/Recommendation ?Admitted  due to nausea and vomiitng, diarrhea and fever with chills.  Pt was doing well on wed morning when he went for dialysis, got the flu vaccine, when he returned home he has some nausea and vomiting but had to attend a funeral. He then developed diarrhea. Stayed home on Thursday and on Friday  he went back to  dialysis and  Was shivering Found to have a temp and sent to ED.  Clostridium septicum bacteremia- this is a GI organism and associated with CRC in 25% of cases. He also has aortitis- (Circumferential arterial wall thickening beginning at the aortic bifurcation and extending along the left greater than right common iliac arteries.).This is an unusual presentation.  Wonder whether him giving enema weekly has anything to do with this  No evidence of gas gangrene ( as this organism can cause spontaneous gas gangrene) This organism is not related to dialysis.  Currently on Cipro and flagyl.  May change to Unasyn  The enteropathogenic ecoli in the stool PCR may have no  Significance or along with clostridium could cause a systemic infection by the way of translocation   Underlying leucocytosis since 2013 ( lymphocytosis)- could he have CLL?  Anemia- could be due to ESRD  Thrombocytopenia since 2018  All three lines are affected -could he have MDS? ? Need for bone marrow biopsy  Murmur - will need TEE  He will need long term antibiotic because of the endovascular infection  ESRD on  dialysis   ___________________________________________________ Discussed with patient and  requesting provider

## 2018-04-17 ENCOUNTER — Inpatient Hospital Stay
Admit: 2018-04-17 | Discharge: 2018-04-17 | Disposition: A | Discharge status: Home / Self Care | Payer: Medicare HMO | Attending: Cardiology | Admitting: Cardiology

## 2018-04-17 DIAGNOSIS — R7881 Bacteremia: Secondary | ICD-10-CM

## 2018-04-17 LAB — ECHOCARDIOGRAM COMPLETE
Height: 73 in
Weight: 3386.27 oz

## 2018-04-17 MED ORDER — PEG 3350-KCL-NA BICARB-NACL 420 G PO SOLR
4000.0000 mL | Freq: Once | ORAL | Status: AC
  Administered 2018-04-17: 4000 mL via ORAL
  Filled 2018-04-17: qty 4000

## 2018-04-17 MED ORDER — SODIUM CHLORIDE 0.9 % IV SOLN
INTRAVENOUS | Status: DC
  Administered 2018-04-17 – 2018-04-18 (×2): via INTRAVENOUS

## 2018-04-17 MED ORDER — BISACODYL 5 MG PO TBEC: 10.0000 mg | DELAYED_RELEASE_TABLET | Freq: Every day | ORAL | Status: DC | PRN

## 2018-04-17 MED ORDER — SODIUM CHLORIDE 0.9 % IV SOLN: INTRAVENOUS | Status: DC

## 2018-04-17 MED ORDER — SODIUM CHLORIDE 0.9 % IV SOLN
3.0000 g | Freq: Two times a day (BID) | INTRAVENOUS | Status: DC
  Administered 2018-04-17 – 2018-04-20 (×5): 3 g via INTRAVENOUS
  Filled 2018-04-17 (×8): qty 3

## 2018-04-17 NOTE — Progress Notes (Signed)
HD tx start   04/17/18 1745  Vital Signs  Pulse Rate 73  Pulse Rate Source Monitor  Resp 18  BP (!) 172/69  BP Location Right Arm  BP Method Automatic  Patient Position (if appropriate) Lying  Oxygen Therapy  SpO2 91 %  O2 Device Room Air  During Hemodialysis Assessment  Blood Flow Rate (mL/min) 400 mL/min  Arterial Pressure (mmHg) -80 mmHg  Venous Pressure (mmHg) 140 mmHg  Transmembrane Pressure (mmHg) 60 mmHg  Ultrafiltration Rate (mL/min) 430 mL/min  Dialysate Flow Rate (mL/min) 500 ml/min  Conductivity: Machine  13.9  HD Safety Checks Performed Yes  Dialysis Fluid Bolus Normal Saline  Bolus Amount (mL) 250 mL  Intra-Hemodialysis Comments Tx initiated  Fistula / Graft Left Upper arm  No Placement Date or Time found.   Orientation: Left  Access Location: Upper arm  Status Accessed  Needle Size 15

## 2018-04-17 NOTE — Progress Notes (Signed)
Patient returned from dialysis with no acute distress. Has started drinking the golytely for the colonoscopy procedure tomorrow. Patient voiced his concern to the RN at dialysis that he was nervous about drinking the golytely tonight. Education and reassurance offered by this RN. Patient verbalized relief after  receiving information. Will continue to monitor.

## 2018-04-17 NOTE — Progress Notes (Signed)
HD tx end    04/17/18 2123  Vital Signs  Pulse Rate 81  Pulse Rate Source Monitor  Resp 17  BP (!) 141/76  BP Location Right Arm  BP Method Automatic  Patient Position (if appropriate) Lying  Oxygen Therapy  SpO2 98 %  O2 Device Room Air  During Hemodialysis Assessment  Dialysis Fluid Bolus Normal Saline  Bolus Amount (mL) 250 mL  Intra-Hemodialysis Comments Tx completed

## 2018-04-17 NOTE — Progress Notes (Signed)
Physical Therapy Treatment Patient Details Name: Jonathan Mcgrath MRN: [id number] DOB: 1940-10-07 Today's Date: 04/17/2018    History of Present Illness Pt is a 77 y.o. male with history of chronic kidney disease currently on dialysis. Pt presents to the emergency department with a 3-day history of nausea vomiting and diarrhea with generalized weakness.     PT Comments    Patient noted with decreased balance control/stability noted this date; requiring bilat UE support for optimal safety at all times.  Improved stability and overall safety with use of RW (vs SPC) this date.  Completes 10' walk time in 10 seconds with RW (unable/unsafe to complete with SPC), indicative of increased fall risk with community mobilization.  Do recommend continued use of RW for all functional mobility at this time. Patient voiced agreement/understanding.    Follow Up Recommendations  Home health PT     Equipment Recommendations  Rolling walker with 5" wheels    Recommendations for Other Services       Precautions / Restrictions Precautions Precautions: Fall Precaution Comments: No BP L UE Restrictions Weight Bearing Restrictions: No    Mobility  Bed Mobility Overal bed mobility: Independent                Transfers Overall transfer level: Modified independent               General transfer comment: multiple attempts required with initial stand; improving with repetition  Ambulation/Gait Ambulation/Gait assistance: Min guard Gait Distance (Feet): 10 Feet Assistive device: Straight cane       General Gait Details: inconsistent step height/length and foot placement, marked instability; patient consistently reaching for walls/furniture for additional stabilization.  Subjectively verbalizing uncertainty with balance using SPC.   Stairs             Wheelchair Mobility    Modified Rankin (Stroke Patients Only)       Balance Overall balance assessment: Needs  assistance Sitting-balance support: No upper extremity supported;Feet supported Sitting balance-Leahy Scale: Good     Standing balance support: No upper extremity supported Standing balance-Leahy Scale: Fair                              Cognition Arousal/Alertness: Awake/alert Behavior During Therapy: WFL for tasks assessed/performed Overall Cognitive Status: Within Functional Limits for tasks assessed                                 General Comments: mild processing difficulties at times?      Exercises Other Exercises Other Exercises: 180' with RW, cga/close sup-improved postural extension, improved and more consistent/symmetrical stepping pattern.  Improved stability and overall safety.  Completes 10' walk time in 10 seconds with RW (unable/unsafe to complete with SPC), indicative of increased fall risk with community mobilization.  Do recommend continued use of RW for all functional mobility at this time. Patient voiced agreement/understanding. Other Exercises: Toilet transfer, ambulatory with RW, cga/close sup; min cuing for walker management with surface changes and narrowed spaces. Other Exercises: Standing LE therex, 1x12 with RW, cga/min assist: heel raises, mini squats, marching and hip flex/ext/abduct/adduct.  Min assist for safety/stability in periods of modified SLS; does require UE support at all times for safety.    General Comments        Pertinent Vitals/Pain Pain Assessment: No/denies pain    Home Living  Prior Function            PT Goals (current goals can now be found in the care plan section) Acute Rehab PT Goals Patient Stated Goal: improve balance  PT Goal Formulation: With patient/family Time For Goal Achievement: 04/29/18 Potential to Achieve Goals: Good Progress towards PT goals: Progressing toward goals    Frequency    Min 2X/week      PT Plan Current plan remains appropriate     Co-evaluation              AM-PAC PT "6 Clicks" Daily Activity  Outcome Measure  Difficulty turning over in bed (including adjusting bedclothes, sheets and blankets)?: None Difficulty moving from lying on back to sitting on the side of the bed? : None Difficulty sitting down on and standing up from a chair with arms (e.g., wheelchair, bedside commode, etc,.)?: Unable Help needed moving to and from a bed to chair (including a wheelchair)?: A Little Help needed walking in hospital room?: A Little Help needed climbing 3-5 steps with a railing? : A Little 6 Click Score: 18    End of Session Equipment Utilized During Treatment: Gait belt Activity Tolerance: Patient tolerated treatment well Patient left: with call bell/phone within reach(patient in bathroom; CNA informed/aware and to attend to patient once complete.) Nurse Communication: Mobility status PT Visit Diagnosis: Unsteadiness on feet (R26.81);Other abnormalities of gait and mobility (R26.89);Muscle weakness (generalized) (M62.81)     Time: 3220-2542 PT Time Calculation (min) (ACUTE ONLY): 16 min  Charges:  $Gait Training: 8-22 mins                     Jonathan Mcgrath, PT, DPT, NCS 04/17/18, 3:52 PM [phone]

## 2018-04-17 NOTE — Progress Notes (Signed)
Vonda Antigua, MD [address], [place], [place], [place], [address] [address], [place], [place], [place], [address] Phone: [phone]  Fax: [phone]   Subjective:  Patient plan to go to hemodialysis today.  Patient will start prep today as well.  No acute events overnight.  Objective: Exam: Vital signs in last 24 hours: Vitals:   04/16/18 0503 04/16/18 0808 04/17/18 0112 04/17/18 0813  BP: (!) 150/73 (!) 161/82 (!) 151/58 (!) 159/72  Pulse: 80 79 78 71  Resp:  20 17 18   Temp: 98.8 F (37.1 C) 97.8 F (36.6 C) 98.8 F (37.1 C) 97.9 F (36.6 C)  TempSrc:  Oral Oral Oral  SpO2: 100% 99% 94% 95%  Weight:      Height:       Weight change:   Intake/Output Summary (Last 24 hours) at 04/17/2018 1033 Last data filed at 04/17/2018 1021 Gross per 24 hour  Intake 680 ml  Output -  Net 680 ml    General: No acute distress, AAO x3 Abd: Soft, NT/ND, No HSM Skin: Warm, no rashes Neck: Supple, Trachea midline   Lab Results: Lab Results  Component Value Date   WBC 12.2 (H) 04/16/2018   HGB 10.2 (L) 04/16/2018   HCT 29.4 (L) 04/16/2018   MCV 93.3 04/16/2018   PLT 113 (L) 04/16/2018   Micro Results: Recent Results (from the past 240 hour(s))  Culture, blood (Routine x 2)     Status: Abnormal   Collection Time: 04/12/18  6:37 AM  Result Value Ref Range Status   Specimen Description   Final    BLOOD RIGHT ARM Performed at [facility], [address], [place], [place] [address]    Special Requests   Final    BOTTLES DRAWN AEROBIC AND ANAEROBIC Blood Culture adequate volume Performed at [facility], [address] [place], [place] [address]    Culture  Setup Time   Final    GRAM POSITIVE RODS ANAEROBIC BOTTLE ONLY CRITICAL RESULT CALLED TO, READ BACK BY AND VERIFIED WITH: DAVID BESANTI AT 2358 04/12/18.PMH Performed at [facility], [address] [place], [place] [address]    Culture CLOSTRIDIUM SPECIES (A)  Final   Report Status 04/16/2018 FINAL  Final  Culture, blood (Routine x 2)     Status: Abnormal   Collection Time: 04/12/18  6:39 AM  Result Value Ref Range Status   Specimen Description   Final    BLOOD RIGHT HAND Performed at [facility], [address] [place], [place] [address]    Special Requests   Final    BOTTLES DRAWN AEROBIC AND ANAEROBIC Blood Culture adequate volume Performed at [facility], [address] [place], [place] [address]    Culture  Setup Time   Final    ANAEROBIC BOTTLE ONLY GRAM POSITIVE RODS CRITICAL VALUE NOTED.  VALUE IS CONSISTENT WITH PREVIOUSLY REPORTED AND CALLED VALUE. Performed at [facility], [address] [place], [place] [address]    Culture CLOSTRIDIUM SEPTICUM (A)  Final   Report Status 04/16/2018 FINAL  Final  Gastrointestinal Panel by PCR , Stool     Status: Abnormal   Collection Time: 04/12/18  7:02 AM  Result Value Ref Range Status   Campylobacter species NOT DETECTED NOT DETECTED Final   Plesimonas shigelloides NOT DETECTED NOT DETECTED Final   Salmonella species NOT DETECTED NOT DETECTED Final   Yersinia enterocolitica NOT DETECTED NOT DETECTED Final   Vibrio species NOT  DETECTED NOT DETECTED Final   Vibrio cholerae NOT DETECTED NOT DETECTED Final   Enteroaggregative E coli (EAEC) NOT DETECTED NOT DETECTED Final   Enteropathogenic E coli (EPEC) DETECTED (A) NOT DETECTED Final    Comment: RESULT CALLED TO, READ BACK BY AND VERIFIED WITH: MATTHEW WRIGHT 04/12/18 @ 1751  [place] E coli (ETEC) NOT DETECTED NOT DETECTED Final   Shiga like toxin producing E coli (STEC) NOT DETECTED NOT DETECTED Final   Shigella/Enteroinvasive E coli (EIEC) NOT DETECTED NOT DETECTED Final   Cryptosporidium NOT DETECTED NOT DETECTED Final   Cyclospora cayetanensis NOT DETECTED NOT DETECTED Final   Entamoeba histolytica NOT DETECTED NOT DETECTED Final   Giardia lamblia NOT DETECTED NOT DETECTED Final   Adenovirus  F40/41 NOT DETECTED NOT DETECTED Final   Astrovirus NOT DETECTED NOT DETECTED Final   Norovirus GI/GII NOT DETECTED NOT DETECTED Final   Rotavirus A NOT DETECTED NOT DETECTED Final   Sapovirus (I, II, IV, and V) NOT DETECTED NOT DETECTED Final    Comment: Performed at [facility], [address] [place], [place] [address]  C difficile quick scan w PCR reflex     Status: Abnormal   Collection Time: 04/12/18  7:02 AM  Result Value Ref Range Status   C Diff antigen POSITIVE (A) NEGATIVE Final   C Diff toxin NEGATIVE NEGATIVE Final   C Diff interpretation Results are indeterminate. See PCR results.  Final    Comment: Performed at [facility], [address] [place], [place] [address]  C. Diff by PCR, Reflexed     Status: None   Collection Time: 04/12/18  7:02 AM  Result Value Ref Range Status   Toxigenic C. Difficile by PCR NEGATIVE NEGATIVE Final    Comment: Patient is colonized with non toxigenic C. difficile. May not need treatment unless significant symptoms are present. Performed at [facility], [address] [place], [place] [address]   MRSA PCR Screening     Status: None   Collection Time: 04/12/18  7:00 PM  Result Value Ref Range Status   MRSA by PCR NEGATIVE NEGATIVE Final    Comment:        The GeneXpert MRSA Assay (FDA approved for NASAL specimens only), is one component of a comprehensive MRSA colonization surveillance program. It is not intended to diagnose MRSA infection nor to guide or monitor treatment for MRSA infections. Performed at [facility], [address] [place], [place] [address]   Culture, blood (Routine X 2) w Reflex to ID Panel     Status: None (Preliminary result)   Collection Time: 04/17/18 12:53 AM  Result Value Ref Range Status   Specimen Description BLOOD BLOOD RIGHT ARM  Final   Special Requests   Final    BOTTLES DRAWN AEROBIC AND ANAEROBIC Blood Culture adequate volume   Culture   Final    NO  GROWTH < 12 HOURS Performed at [facility], [address], [place], [place] [address]    Report Status PENDING  Incomplete  Culture, blood (Routine X 2) w Reflex to ID Panel     Status: None (Preliminary result)   Collection Time: 04/17/18 12:59 AM  Result Value Ref Range Status   Specimen Description BLOOD BLOOD RIGHT WRIST  Final   Special Requests   Final    BOTTLES DRAWN AEROBIC AND ANAEROBIC Blood Culture adequate volume   Culture   Final    NO GROWTH < 12 HOURS  Performed at [facility], [address], [place], [place] [address]    Report Status PENDING  Incomplete   Studies/Results: No results found. Medications:  Scheduled Meds: . amLODipine  5 mg Oral Daily  . Chlorhexidine Gluconate Cloth  6 each Topical Q0600  . epoetin (EPOGEN/PROCRIT) injection  4,000 Units Intravenous Q M,W,F-HD  . multivitamin  1 tablet Oral Daily  . polyethylene glycol-electrolytes  4,000 mL Oral Once  . sodium chloride flush  3 mL Intravenous Q12H   Continuous Infusions: . sodium chloride 250 mL (04/15/18 2113)  . ciprofloxacin 400 mg (04/17/18 0915)  . famotidine (PEPCID) IV 20 mg (04/16/18 1815)  . metronidazole 500 mg (04/17/18 0459)   PRN Meds:.sodium chloride, acetaminophen **OR** acetaminophen, bisacodyl, calcium carbonate, hydrALAZINE, HYDROcodone-acetaminophen, ondansetron **OR** ondansetron (ZOFRAN) IV, sodium chloride flush   Assessment: 77 year old male with no prior history of EGD or colonoscopy with positive Clostridium septicum on blood cultures   Plan: We will plan on EGD and colonoscopy tomorrow after patient completes colonoscopy prep today for further evaluation and ruling out GI malignancy given positive blood cultures  Blood cultures from today with no growth <12 hrs so far. ID following  Clear liquid diet today N.p.o. after 5 AM tomorrow morning  I have discussed alternative options, risks & benefits,  which include, but are not limited to,  bleeding, infection, perforation,respiratory complication & drug reaction.  The patient agrees with this plan & written consent will be obtained.      LOS: 5 days   Vonda Antigua, MD 04/17/2018, 10:33 AM

## 2018-04-17 NOTE — Care Management Important Message (Signed)
Important Message  Patient Details  Name: Jonathan Mcgrath MRN: [id number] Date of Birth: 1941-02-25   Medicare Important Message Given:  Yes    [name] 04/17/2018, 11:51 AM

## 2018-04-17 NOTE — Progress Notes (Signed)
Patient ID: Jonathan Mcgrath, male   DOB: [birth year], 77 y.o.   MRN: [id number]  [employer] [facility] PROGRESS NOTE  KHYRI HINZMAN DPO:[id number] DOB: 03-Jun-1941 DOA: 04/12/2018 PCP: [name], MD  HPI/Subjective: Patient feeling a little bit better.  Still with some nausea.  Some left abdominal and left lower quadrant abdominal pain.  Some diarrhea.  Objective: Vitals:   04/17/18 0112 04/17/18 0813  BP: (!) 151/58 (!) 159/72  Pulse: 78 71  Resp: 17 18  Temp: 98.8 F (37.1 C) 97.9 F (36.6 C)  SpO2: 94% 95%    Filed Weights   04/12/18 0628 04/15/18 1539 04/15/18 1938  Weight: 90.8 kg 95.8 kg 96 kg    ROS: Review of Systems  Constitutional: Negative for chills and fever.  Eyes: Negative for blurred vision.  Respiratory: Negative for cough and shortness of breath.   Cardiovascular: Negative for chest pain.  Gastrointestinal: Positive for abdominal pain, diarrhea and nausea. Negative for constipation and vomiting.  Genitourinary: Negative for dysuria.  Musculoskeletal: Negative for joint pain.  Neurological: Negative for dizziness and headaches.   Exam: Physical Exam  Constitutional: He is oriented to person, place, and time.  HENT:  Nose: No mucosal edema.  Mouth/Throat: No oropharyngeal exudate or posterior oropharyngeal edema.  Eyes: Pupils are equal, round, and reactive to light. Conjunctivae, EOM and lids are normal.  Neck: No JVD present. Carotid bruit is not present. No edema present. No thyroid mass and no thyromegaly present.  Cardiovascular: S1 normal and S2 normal. Exam reveals no gallop.  No murmur heard. Pulses:      Dorsalis pedis pulses are 2+ on the right side, and 2+ on the left side.  Respiratory: No respiratory distress. He has no wheezes. He has no rhonchi. He has no rales.  GI: Soft. Bowel sounds are normal. There is tenderness in the left lower quadrant.  Musculoskeletal:       Right ankle: He exhibits swelling.       Left ankle: He exhibits  swelling.  Lymphadenopathy:    He has no cervical adenopathy.  Neurological: He is alert and oriented to person, place, and time. No cranial nerve deficit.  Skin: Skin is warm. No rash noted. Nails show no clubbing.  Psychiatric: He has a normal mood and affect.      Data Reviewed: Basic Metabolic Panel: Recent Labs  Lab 04/12/18 0639 04/12/18 1323 04/13/18 0156 04/15/18 1614 04/16/18 0319  NA 141  --  139  --  138  K 4.5  --  3.8  --  3.8  CL 97*  --  98  --  97*  CO2 27  --  32  --  32  GLUCOSE 114*  --  98  --  108*  BUN 50*  --  23  --  26*  CREATININE 10.26*  --  5.89*  --  6.59*  CALCIUM 8.8*  --  8.4*  --  8.0*  PHOS  --  5.5*  --  5.5*  --    Liver Function Tests: Recent Labs  Lab 04/12/18 0639  AST 24  ALT 17  ALKPHOS 77  BILITOT 1.2  PROT 6.7  ALBUMIN 3.7   CBC: Recent Labs  Lab 04/12/18 0639 04/13/18 0156 04/13/18 0511 04/14/18 0457 04/15/18 1614 04/16/18 0319  WBC 24.3* 20.9* 21.9* 17.4* 14.6* 12.2*  NEUTROABS 12.0*  --   --   --   --   --   HGB 11.7* 10.8* 10.6* 10.7* 10.3* 10.2*  HCT 34.3* 32.5* 31.3* 29.9* 31.0* 29.4*  MCV 90.9 91.9 91.8 91.1 93.4 93.3  PLT 135* 118* 116* 112* 130* 113*     Recent Results (from the past 240 hour(s))  Culture, blood (Routine x 2)     Status: Abnormal   Collection Time: 04/12/18  6:37 AM  Result Value Ref Range Status   Specimen Description   Final    BLOOD RIGHT ARM Performed at [facility], [place] [address], [place], [place] [address]    Special Requests   Final    BOTTLES DRAWN AEROBIC AND ANAEROBIC Blood Culture adequate volume Performed at [facility], [address] [place], [place] [address]    Culture  Setup Time   Final    GRAM POSITIVE RODS ANAEROBIC BOTTLE ONLY CRITICAL RESULT CALLED TO, READ BACK BY AND VERIFIED WITH: DAVID BESANTI AT 2358 04/12/18.PMH Performed at [facility], [address] [place], [place] [address]    Culture CLOSTRIDIUM SPECIES (A)   Final   Report Status 04/16/2018 FINAL  Final  Culture, blood (Routine x 2)     Status: Abnormal   Collection Time: 04/12/18  6:39 AM  Result Value Ref Range Status   Specimen Description   Final    BLOOD RIGHT HAND Performed at [facility], [address] [place], [place] [address]    Special Requests   Final    BOTTLES DRAWN AEROBIC AND ANAEROBIC Blood Culture adequate volume Performed at [facility], [address] [place], [place] [address]    Culture  Setup Time   Final    ANAEROBIC BOTTLE ONLY GRAM POSITIVE RODS CRITICAL VALUE NOTED.  VALUE IS CONSISTENT WITH PREVIOUSLY REPORTED AND CALLED VALUE. Performed at [facility], [address] [place], [place] [address]    Culture CLOSTRIDIUM SEPTICUM (A)  Final   Report Status 04/16/2018 FINAL  Final  Gastrointestinal Panel by PCR , Stool     Status: Abnormal   Collection Time: 04/12/18  7:02 AM  Result Value Ref Range Status   Campylobacter species NOT DETECTED NOT DETECTED Final   Plesimonas shigelloides NOT DETECTED NOT DETECTED Final   Salmonella species NOT DETECTED NOT DETECTED Final   Yersinia enterocolitica NOT DETECTED NOT DETECTED Final   Vibrio species NOT DETECTED NOT DETECTED Final   Vibrio cholerae NOT DETECTED NOT DETECTED Final   Enteroaggregative E coli (EAEC) NOT DETECTED NOT DETECTED Final   Enteropathogenic E coli (EPEC) DETECTED (A) NOT DETECTED Final    Comment: RESULT CALLED TO, READ BACK BY AND VERIFIED WITH: MATTHEW WRIGHT 04/12/18 @ 1751  MLK    Enterotoxigenic E coli (ETEC) NOT DETECTED NOT DETECTED Final   Shiga like toxin producing E coli (STEC) NOT DETECTED NOT DETECTED Final   Shigella/Enteroinvasive E coli (EIEC) NOT DETECTED NOT DETECTED Final   Cryptosporidium NOT DETECTED NOT DETECTED Final   Cyclospora cayetanensis NOT DETECTED NOT DETECTED Final   Entamoeba histolytica NOT DETECTED NOT DETECTED Final   Giardia lamblia NOT DETECTED NOT DETECTED Final    Adenovirus F40/41 NOT DETECTED NOT DETECTED Final   Astrovirus NOT DETECTED NOT DETECTED Final   Norovirus GI/GII NOT DETECTED NOT DETECTED Final   Rotavirus A NOT DETECTED NOT DETECTED Final   Sapovirus (I, II, IV, and V) NOT DETECTED NOT DETECTED Final    Comment: Performed at [facility], [address] [place], [place] [address]  C difficile quick scan w PCR reflex     Status: Abnormal  Collection Time: 04/12/18  7:02 AM  Result Value Ref Range Status   C Diff antigen POSITIVE (A) NEGATIVE Final   C Diff toxin NEGATIVE NEGATIVE Final   C Diff interpretation Results are indeterminate. See PCR results.  Final    Comment: Performed at [facility], [address] [place], [place] [address]  C. Diff by PCR, Reflexed     Status: None   Collection Time: 04/12/18  7:02 AM  Result Value Ref Range Status   Toxigenic C. Difficile by PCR NEGATIVE NEGATIVE Final    Comment: Patient is colonized with non toxigenic C. difficile. May not need treatment unless significant symptoms are present. Performed at [facility], [address] [place], [place] [address]   MRSA PCR Screening     Status: None   Collection Time: 04/12/18  7:00 PM  Result Value Ref Range Status   MRSA by PCR NEGATIVE NEGATIVE Final    Comment:        The GeneXpert MRSA Assay (FDA approved for NASAL specimens only), is one component of a comprehensive MRSA colonization surveillance program. It is not intended to diagnose MRSA infection nor to guide or monitor treatment for MRSA infections. Performed at [facility], [address] [place], [place] [address]   Culture, blood (Routine X 2) w Reflex to ID Panel     Status: None (Preliminary result)   Collection Time: 04/17/18 12:53 AM  Result Value Ref Range Status   Specimen Description BLOOD BLOOD RIGHT ARM  Final   Special Requests   Final    BOTTLES DRAWN AEROBIC AND ANAEROBIC Blood Culture adequate volume   Culture    Final    NO GROWTH < 12 HOURS Performed at [facility], [address] [place], [place] [address]    Report Status PENDING  Incomplete  Culture, blood (Routine X 2) w Reflex to ID Panel     Status: None (Preliminary result)   Collection Time: 04/17/18 12:59 AM  Result Value Ref Range Status   Specimen Description BLOOD BLOOD RIGHT WRIST  Final   Special Requests   Final    BOTTLES DRAWN AEROBIC AND ANAEROBIC Blood Culture adequate volume   Culture   Final    NO GROWTH < 12 HOURS Performed at [facility], [address] [place], [place] [address]    Report Status PENDING  Incomplete      Scheduled Meds: . amLODipine  5 mg Oral Daily  . Chlorhexidine Gluconate Cloth  6 each Topical Q0600  . epoetin (EPOGEN/PROCRIT) injection  4,000 Units Intravenous Q M,W,F-HD  . multivitamin  1 tablet Oral Daily  . polyethylene glycol-electrolytes  4,000 mL Oral Once  . sodium chloride flush  3 mL Intravenous Q12H   Continuous Infusions: . sodium chloride Stopped (04/16/18 0700)  . sodium chloride 20 mL/hr at 04/17/18 1219  . sodium chloride    . ampicillin-sulbactam (UNASYN) IV    . famotidine (PEPCID) IV Stopped (04/16/18 1845)    Assessment/Plan:  1. Clostridium bacteremia, aortitis.  Case discussed with infectious disease and she recommended at least 4 weeks of IV antibiotics.  She will try to dose things with dialysis if possible.  Colonoscopy and endoscopy tomorrow for ruling out GI malignancy.  TEE to be done to rule out endocarditis.  Patient currently on IV unasyn. 2. Abdominal pain, nausea.  Stool culture growing out enteropathogenic E. Coli.  Patient on Cipro.  Empiric Pepcid. 3. End-stage renal disease on hemodialysis  Monday Wednesday Friday. 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  Case discussed with Dr. Precious Reel rheumatology and he mentioned treat infection and then reassess down the line.  No blood clots seen  on CT scan.  Code Status:     Code Status Orders  (From admission, onward)         Start     Ordered   04/12/18 1311  Do not attempt resuscitation (DNR)  Continuous    Question Answer Comment  In the event of cardiac or respiratory ARREST Do not call a "code blue"   In the event of cardiac or respiratory ARREST Do not perform Intubation, CPR, defibrillation or ACLS   In the event of cardiac or respiratory ARREST Use medication by any route, position, wound care, and other measures to relive pain and suffering. May use oxygen, suction and manual treatment of airway obstruction as needed for comfort.      04/12/18 1310        Code Status History    Date Active Date Inactive Code Status Order ID Comments User Context   04/12/2018 0827 04/12/2018 1310 DNR [id number]  Dustin Flock, MD ED   08/17/2016 0352 08/18/2016 2236 Full Code [id number]  Saundra Shelling, MD Inpatient   01/12/2016 1417 01/12/2016 1754 Full Code [id number]  Algernon Huxley, MD Inpatient      Disposition Plan: Hopefully will have studies done in the next couple days and will be able to discharge with a plan for outpatient antibiotics.  Consultants:  Vascular surgery  Nephrology  Infectious disease  Gastroenterology  Time spent: 27 minutes. Spoke with patient's daughter on the phone.  She gave me a note to call her brother starting tomorrow at [phone]   Haileyann Staiger  [employer]

## 2018-04-17 NOTE — Progress Notes (Signed)
Post HD assessment. Pt tolerated tx well without c/o or complication. Net UF 1039, goal met.   04/17/18 2130  Vital Signs  Temp 98.3 F (36.8 C)  Temp Source Oral  Pulse Rate 78  Pulse Rate Source Monitor  Resp 19  BP (!) 176/67  BP Location Right Arm  BP Method Automatic  Patient Position (if appropriate) Lying  Oxygen Therapy  SpO2 98 %  O2 Device Room Air  Dialysis Weight  Weight 96.4 kg  Type of Weight Post-Dialysis  Post-Hemodialysis Assessment  Rinseback Volume (mL) 250 mL  KECN 87.9 V  Dialyzer Clearance Lightly streaked  Duration of HD Treatment -hour(s) 3.5 hour(s)  Hemodialysis Intake (mL) 500 mL  UF Total -Machine (mL) 1539 mL  Net UF (mL) 1039 mL  Tolerated HD Treatment Yes  AVG/AVF Arterial Site Held (minutes) 10 minutes  AVG/AVF Venous Site Held (minutes) 10 minutes  Education / Care Plan  Dialysis Education Provided Yes  Documented Education in Care Plan Yes  Fistula / Graft Left Upper arm  No Placement Date or Time found.   Orientation: Left  Access Location: Upper arm  Site Condition No complications  Fistula / Graft Assessment Present;Thrill;Bruit  Status Deaccessed  Drainage Description None

## 2018-04-17 NOTE — Progress Notes (Signed)
Pre HD assessment    04/17/18 1734  Vital Signs  Temp (!) 97.3 F (36.3 C)  Temp Source Oral  Pulse Rate 72  Pulse Rate Source Monitor  Resp 17  BP (!) 156/67  BP Location Right Arm  BP Method Automatic  Patient Position (if appropriate) Lying  Oxygen Therapy  SpO2 94 %  O2 Device Room Air  Pain Assessment  Pain Scale 0-10  Pain Score 0  Dialysis Weight  Weight 97.3 kg  Type of Weight Pre-Dialysis  Time-Out for Hemodialysis  What Procedure? HD  Pt Identifiers(min of two) First/Last Name;MRN/Account#  Correct Site? Yes  Correct Side? Yes  Correct Procedure? Yes  Consents Verified? Yes  Rad Studies Available? N/A  Safety Precautions Reviewed? Yes  Engineer, civil (consulting) Number  (2A)  Station Number 1  UF/Alarm Test Passed  Conductivity: Meter 13.8  Conductivity: Machine  14  pH 7.4  Reverse Osmosis main  Normal Saline Lot Number [id number]  Dialyzer Lot Number [id number]  Disposable Set Lot Number [id number]  Machine Temperature 98.6 F (37 C)  Musician and Audible Yes  Blood Lines Intact and Secured Yes  Pre Treatment Patient Checks  Vascular access used during treatment Fistula  Hepatitis B Surface Antigen Results Negative  Date Hepatitis B Surface Antigen Drawn 09/05/17  Hepatitis B Surface Antibody  (>10)  Date Hepatitis B Surface Antibody Drawn 09/05/17  Hemodialysis Consent Verified Yes  Hemodialysis Standing Orders Initiated Yes  ECG (Telemetry) Monitor On Yes  Prime Ordered Normal Saline  Length of  DialysisTreatment -hour(s) 3.5 Hour(s)  Dialyzer Elisio 17H NR  Dialysate 3K, 2.5 Ca  Dialysis Anticoagulant None  Dialysate Flow Ordered 500  Blood Flow Rate Ordered 400 mL/min  Ultrafiltration Goal 1 Liters  Pre Treatment Labs Phosphorus  Dialysis Blood Pressure Support Ordered Normal Saline  Education / Care Plan  Dialysis Education Provided Yes  Documented Education in Care Plan Yes  Fistula / Graft Left Upper arm  No Placement Date or Time  found.   Orientation: Left  Access Location: Upper arm  Site Condition No complications  Fistula / Graft Assessment Present;Thrill;Bruit  Drainage Description None

## 2018-04-17 NOTE — Progress Notes (Signed)
Post HD assessment    04/17/18 2131  Neurological  Level of Consciousness Alert  Orientation Level Oriented X4  Respiratory  Respiratory Pattern Regular;Unlabored  Chest Assessment Chest expansion symmetrical  Cardiac  ECG Monitor Yes  Vascular  R Radial Pulse +2  L Radial Pulse +2  Edema Generalized  Integumentary  Integumentary (WDL) X  Skin Color Appropriate for ethnicity  Musculoskeletal  Musculoskeletal (WDL) X  Generalized Weakness Yes  Assistive Device None  GU Assessment  Genitourinary (WDL) X  Genitourinary Symptoms  (HD)  Psychosocial  Psychosocial (WDL) X  Patient Behaviors Cooperative;Calm;Anxious (worried about procedure tomorrow. )

## 2018-04-17 NOTE — Progress Notes (Signed)
*  PRELIMINARY RESULTS* Echocardiogram 2D Echocardiogram has been performed.  Jonathan Mcgrath 04/17/2018, 9:35 AM

## 2018-04-17 NOTE — Progress Notes (Signed)
1st attempt to get report pre HD tx, RN unavailable to give report.    04/17/18 1650  Hand-Off documentation  Report given to (Full Name) Stark Bray  Report received from (Full Name) Charmian Muff

## 2018-04-17 NOTE — Progress Notes (Signed)
Pre HD assessment    04/17/18 1735  Neurological  Level of Consciousness Alert  Orientation Level Oriented X4  Respiratory  Respiratory Pattern Regular;Unlabored  Chest Assessment Chest expansion symmetrical  Cardiac  ECG Monitor Yes  Vascular  R Radial Pulse +2  L Radial Pulse +2  Edema Generalized  Integumentary  Integumentary (WDL) X  Skin Color Appropriate for ethnicity  Musculoskeletal  Musculoskeletal (WDL) X  Generalized Weakness Yes  Assistive Device None  GU Assessment  Genitourinary (WDL) X  Genitourinary Symptoms  (HD)  Psychosocial  Psychosocial (WDL) WDL

## 2018-04-17 NOTE — Progress Notes (Signed)
Jonathan Mcgrath is a 77 y.o. with a history of ESRD, HTN , DM2 Admitted  due to nausea and vomiitng, diarrhea and fever with chills.  Pt was doing well on wed morning when he went for dialysis, got the flu vaccine, when he returned home he has some nausea and vomiting but had to attend a funeral. He then developed diarrhea. Stayed home on Thursday and on Friday  he went back to  dialysis and  Was shivering Found to have a temp and sent to ED. Temp of 102.1 in the ED  Had Stool tests which showed enteropathogenic ecoli, and cdiff antigen positive but negative toxin and Neg PCR. Blood cultures were sent and he was started on vanco and cefepime and flagyl .Marland Kitchen He had a CT abdomen which showed Mild inflammatory changes surrounding the inferior mesenteric artery and inferior mesenteric vein along the anterior aspect of the aorta and left common iliac artery. No focal fluid collection is noted. He had CTA on 9/30 which showed Circumferential arterial wall thickening beginning at the aortic bifurcation and extending along the left greater than right common iliac arteries. There are associated inflammatory changes with inflammatory stranding in the immediately adjacent retroperitoneal fat and left pelvic sidewall. 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 rate 16, height '6\' 1"'  (1.854 m), weight 97.3 kg, SpO2 94 %.   PHYSICAL EXAM:  General: Alert, cooperative, no distress, appears stated age.  Head: Normocephalic, without obvious abnormality, atraumatic. Eyes: Conjunctivae clear, anicteric sclerae. Pupils are equal ENT Nares normal. No drainage or sinus tenderness. Lips, mucosa, and tongue normal. No Thrush Neck:  Supple, symmetrical, no adenopathy, thyroid: non tender no carotid bruit and no JVD. Back: No CVA tenderness. Lungs: Clear to auscultation bilaterally. No Wheezing or Rhonchi. No rales. Heart: s1s2, murmur heard on the rt sternal border Abdomen: Soft, non-tender,not distended. Bowel sounds normal. No masses Extremities: atraumatic, no cyanosis. No edema. No clubbing Skin: No rashes or lesions. Or bruising Lymph: Cervical, supraclavicular normal. Neurologic: Grossly non-focal   Pertinent Labs CBC Latest Ref Rng & Units 04/16/2018 04/15/2018 04/14/2018  WBC 3.8 - 10.6 K/uL 12.2(H) 14.6(H) 17.4(H)  Hemoglobin 13.0 - 18.0 g/dL 10.2(L) 10.3(L) 10.7(L)  Hematocrit 40.0 - 52.0 % 29.4(L) 31.0(L) 29.9(L)  Platelets 150 - 440 K/uL 113(L) 130(L) 112(L)    CMP Latest Ref Rng & Units 04/16/2018 04/13/2018 04/12/2018  Glucose 70 - 99 mg/dL 108(H) 98 114(H)  BUN 8 - 23 mg/dL 26(H) 23 50(H)  Creatinine 0.61 - 1.24 mg/dL 6.59(H) 5.89(H) 10.26(H)  Sodium 135 - 145 mmol/L 138 139 141  Potassium 3.5 - 5.1 mmol/L 3.8 3.8 4.5  Chloride 98 - 111 mmol/L 97(L) 98 97(L)  CO2 22 - 32 mmol/L 32 32 27  Calcium 8.9 - 10.3 mg/dL 8.0(L) 8.4(L) 8.8(L)  Total Protein 6.5 - 8.1 g/dL - - 6.7  Total Bilirubin 0.3 - 1.2 mg/dL - - 1.2  Alkaline Phos 38 - 126 U/L - - 77  AST 15 - 41 U/L - - 24  ALT 0 - 44 U/L - - 17   bc- 9/27 clostridium septicum  IMAGING RESULTS: CT angio abdomen and pelvis ?Circumferential arterial wall thickening beginning at the aortic bifurcation and extending along the left greater than  right common iliac arteries. There are associated inflammatory changes with inflammatory stranding in the immediately adjacent retroperitoneal fat and left pelvic sidewall. 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     Impression/Recommendation ?Admitted  due to nausea and vomiitng, diarrhea and fever with chills.  Pt was  doing well on wed morning when he went for dialysis, got the flu vaccine, when he returned home he has some nausea and vomiting but had to attend a funeral. He then developed diarrhea. Stayed home on Thursday and on Friday  he went back to  dialysis and  Was shivering Found to have a temp and sent to ED.  Clostridium septicum bacteremia- this is a GI organism and associated with CRC in 25% of cases. He also has aortitis- (Circumferential arterial wall thickening beginning at the aortic bifurcation and extending along the left greater than right common iliac arteries.). Films reviewed with radiologist [facility] whether him giving enema weekly has anything to do with this  No evidence of gas gangrene ( as this organism can cause spontaneous gas gangrene) This organism is not related to dialysis.  Currently on Cipro and flagyl.  changed to Unasyn  The enteropathogenic ecoli in the stool PCR may have no  Significance or along with clostridium could cause a systemic infection by the way of translocation   Underlying leucocytosis since 2013 ( lymphocytosis)- could he have CLL?  Anemia- could be due to ESRD  Thrombocytopenia since 2018  All three lines are affected -could he have MDS? ? Need for bone marrow biopsy  Murmur - will need TEE  He will need long term antibiotics ( 4 weeks)  because of the vascular inflammation Will need unasyn IV q 12  ESRD on dialysis  He will be having colonoscopy tomorrow- TEE planned for this evening  Discussed the management with the patient

## 2018-04-17 NOTE — Progress Notes (Signed)
[facility], [place] 04/17/18  Subjective:  Pt had 2D echo today. Due for endoscopy tomorrow. Pt also due for HD today.    Objective:  Vital signs in last 24 hours:  Temp:  [97.9 F (36.6 C)-98.8 F (37.1 C)] 97.9 F (36.6 C) (10/02 0813) Pulse Rate:  [71-78] 71 (10/02 0813) Resp:  [17-18] 18 (10/02 0813) BP: (151-159)/(58-72) 159/72 (10/02 0813) SpO2:  [94 %-95 %] 95 % (10/02 0813)  Weight change:  Filed Weights   04/12/18 0628 04/15/18 1539 04/15/18 1938  Weight: 90.8 kg 95.8 kg 96 kg    Intake/Output:    Intake/Output Summary (Last 24 hours) at 04/17/2018 1003 Last data filed at 04/17/2018 0656 Gross per 24 hour  Intake 440 ml  Output -  Net 440 ml     Physical Exam: General:  No acute distress  HEENT  anicteric, moist oral mucous membranes  Neck  supple  Pulm/lungs  normal breathing effort, room air, CTAB  CVS/Heart  regular  Abdomen:   Nontender, BS present  Extremities:  trace peripheral edema  Neurologic:  Alert, oriented, follows commands  Skin:  No acute rashes  Access:  Left forearm AV fistula       Basic Metabolic Panel:  Recent Labs  Lab 04/12/18 0639 04/12/18 1323 04/13/18 0156 04/15/18 1614 04/16/18 0319  NA 141  --  139  --  138  K 4.5  --  3.8  --  3.8  CL 97*  --  98  --  97*  CO2 27  --  32  --  32  GLUCOSE 114*  --  98  --  108*  BUN 50*  --  23  --  26*  CREATININE 10.26*  --  5.89*  --  6.59*  CALCIUM 8.8*  --  8.4*  --  8.0*  PHOS  --  5.5*  --  5.5*  --      CBC: Recent Labs  Lab 04/12/18 0639 04/13/18 0156 04/13/18 0511 04/14/18 0457 04/15/18 1614 04/16/18 0319  WBC 24.3* 20.9* 21.9* 17.4* 14.6* 12.2*  NEUTROABS 12.0*  --   --   --   --   --   HGB 11.7* 10.8* 10.6* 10.7* 10.3* 10.2*  HCT 34.3* 32.5* 31.3* 29.9* 31.0* 29.4*  MCV 90.9 91.9 91.8 91.1 93.4 93.3  PLT 135* 118* 116* 112* 130* 113*     No results found for: HEPBSAG, HEPBSAB, HEPBIGM    Microbiology:  Recent  Results (from the past 240 hour(s))  Culture, blood (Routine x 2)     Status: Abnormal   Collection Time: 04/12/18  6:37 AM  Result Value Ref Range Status   Specimen Description   Final    BLOOD RIGHT ARM Performed at [facility], [address], [place], [place] [address]    Special Requests   Final    BOTTLES DRAWN AEROBIC AND ANAEROBIC Blood Culture adequate volume Performed at [facility], [address] [place], [place] [address]    Culture  Setup Time   Final    GRAM POSITIVE RODS ANAEROBIC BOTTLE ONLY CRITICAL RESULT CALLED TO, READ BACK BY AND VERIFIED WITH: DAVID BESANTI AT 2358 04/12/18.PMH Performed at [facility], [address] [place], [place] [address]    Culture CLOSTRIDIUM SPECIES (A)  Final   Report Status 04/16/2018 FINAL  Final  Culture, blood (Routine x 2)     Status: Abnormal   Collection Time: 04/12/18  6:39  AM  Result Value Ref Range Status   Specimen Description   Final    BLOOD RIGHT HAND Performed at [facility], [address], [place], [place] [address]    Special Requests   Final    BOTTLES DRAWN AEROBIC AND ANAEROBIC Blood Culture adequate volume Performed at [facility], [address] [place], [place] [address]    Culture  Setup Time   Final    ANAEROBIC BOTTLE ONLY GRAM POSITIVE RODS CRITICAL VALUE NOTED.  VALUE IS CONSISTENT WITH PREVIOUSLY REPORTED AND CALLED VALUE. Performed at [facility], [address] [place], [place] [address]    Culture CLOSTRIDIUM SEPTICUM (A)  Final   Report Status 04/16/2018 FINAL  Final  Gastrointestinal Panel by PCR , Stool     Status: Abnormal   Collection Time: 04/12/18  7:02 AM  Result Value Ref Range Status   Campylobacter species NOT DETECTED NOT DETECTED Final   Plesimonas shigelloides NOT DETECTED NOT DETECTED Final   Salmonella species NOT DETECTED NOT DETECTED Final   Yersinia enterocolitica NOT DETECTED NOT DETECTED Final   Vibrio  species NOT DETECTED NOT DETECTED Final   Vibrio cholerae NOT DETECTED NOT DETECTED Final   Enteroaggregative E coli (EAEC) NOT DETECTED NOT DETECTED Final   Enteropathogenic E coli (EPEC) DETECTED (A) NOT DETECTED Final    Comment: RESULT CALLED TO, READ BACK BY AND VERIFIED WITH: MATTHEW WRIGHT 04/12/18 @ 1751  MLK    Enterotoxigenic E coli (ETEC) NOT DETECTED NOT DETECTED Final   Shiga like toxin producing E coli (STEC) NOT DETECTED NOT DETECTED Final   Shigella/Enteroinvasive E coli (EIEC) NOT DETECTED NOT DETECTED Final   Cryptosporidium NOT DETECTED NOT DETECTED Final   Cyclospora cayetanensis NOT DETECTED NOT DETECTED Final   Entamoeba histolytica NOT DETECTED NOT DETECTED Final   Giardia lamblia NOT DETECTED NOT DETECTED Final   Adenovirus F40/41 NOT DETECTED NOT DETECTED Final   Astrovirus NOT DETECTED NOT DETECTED Final   Norovirus GI/GII NOT DETECTED NOT DETECTED Final   Rotavirus A NOT DETECTED NOT DETECTED Final   Sapovirus (I, II, IV, and V) NOT DETECTED NOT DETECTED Final    Comment: Performed at [facility], [address] [place], [place] [address]  C difficile quick scan w PCR reflex     Status: Abnormal   Collection Time: 04/12/18  7:02 AM  Result Value Ref Range Status   C Diff antigen POSITIVE (A) NEGATIVE Final   C Diff toxin NEGATIVE NEGATIVE Final   C Diff interpretation Results are indeterminate. See PCR results.  Final    Comment: Performed at [facility], [address] [place], [place] [address]  C. Diff by PCR, Reflexed     Status: None   Collection Time: 04/12/18  7:02 AM  Result Value Ref Range Status   Toxigenic C. Difficile by PCR NEGATIVE NEGATIVE Final    Comment: Patient is colonized with non toxigenic C. difficile. May not need treatment unless significant symptoms are present. Performed at [facility], [address] [place],  [address]   MRSA PCR Screening     Status: None   Collection Time: 04/12/18   7:00 PM  Result Value Ref Range Status   MRSA by PCR NEGATIVE NEGATIVE Final    Comment:        The GeneXpert MRSA Assay (FDA approved for NASAL specimens only), is one component of a comprehensive MRSA colonization surveillance program. It is not intended to diagnose MRSA  infection nor to guide or monitor treatment for MRSA infections. Performed at [facility], [address] [place], [place] [address]   Culture, blood (Routine X 2) w Reflex to ID Panel     Status: None (Preliminary result)   Collection Time: 04/17/18 12:53 AM  Result Value Ref Range Status   Specimen Description BLOOD BLOOD RIGHT ARM  Final   Special Requests   Final    BOTTLES DRAWN AEROBIC AND ANAEROBIC Blood Culture adequate volume   Culture   Final    NO GROWTH < 12 HOURS Performed at [facility], [address], [place], [place] [address]    Report Status PENDING  Incomplete  Culture, blood (Routine X 2) w Reflex to ID Panel     Status: None (Preliminary result)   Collection Time: 04/17/18 12:59 AM  Result Value Ref Range Status   Specimen Description BLOOD BLOOD RIGHT WRIST  Final   Special Requests   Final    BOTTLES DRAWN AEROBIC AND ANAEROBIC Blood Culture adequate volume   Culture   Final    NO GROWTH < 12 HOURS Performed at [facility], [address] [place], [place] [address]    Report Status PENDING  Incomplete    Coagulation Studies: No results for input(s): LABPROT, INR in the last 72 hours.  Urinalysis: No results for input(s): COLORURINE, LABSPEC, PHURINE, GLUCOSEU, HGBUR, BILIRUBINUR, KETONESUR, PROTEINUR, UROBILINOGEN, NITRITE, LEUKOCYTESUR in the last 72 hours.  Invalid input(s): APPERANCEUR    Imaging: No results found.   Medications:   . sodium chloride 250 mL (04/15/18 2113)  . ciprofloxacin 400 mg (04/17/18 0915)  . famotidine (PEPCID) IV 20 mg (04/16/18 1815)  . metronidazole 500 mg (04/17/18 0459)   . amLODipine  5 mg Oral Daily   . Chlorhexidine Gluconate Cloth  6 each Topical Q0600  . epoetin (EPOGEN/PROCRIT) injection  4,000 Units Intravenous Q M,W,F-HD  . multivitamin  1 tablet Oral Daily  . polyethylene glycol-electrolytes  4,000 mL Oral Once  . sodium chloride flush  3 mL Intravenous Q12H   sodium chloride, acetaminophen **OR** acetaminophen, bisacodyl, calcium carbonate, hydrALAZINE, HYDROcodone-acetaminophen, ondansetron **OR** ondansetron (ZOFRAN) IV, sodium chloride flush  Assessment/ Plan:  77 y.o. male  ESRD, hypertension, diabetes mellitus, GERD, anemia of CKD, SHPTH, transitioned off PD to HD 6/16.  [facility]  1.  End-stage renal disease -Patient due for HD today, continue MWF schedule.   2.  Anemia of CKD -Administer epogen 4000 units IV with HD today.   3.  Secondary hyperparathyroidism -recheck serum phos today with HD.   4. Acute Gastroenteritis -C. difficile antigen positive but toxin negative. Stool positive for enteropathogenic E. coli Treated with Cipro    LOS: 5 Ezzard Ditmer 10/2/201910:03 AM  [place], [place]  Note: This note was prepared with Dragon dictation. Any transcription errors are unintentional

## 2018-04-17 NOTE — Progress Notes (Signed)
Patient with bacteremia.  Had breakfast this morning.  EGD/colonoscopy scheduled for tomorrow.  Will plan transesophageal echo on Friday morning.  Further recommendations after this is complete.  Orders have been placed.

## 2018-04-18 ENCOUNTER — Inpatient Hospital Stay: Payer: Medicare HMO

## 2018-04-18 ENCOUNTER — Inpatient Hospital Stay: Payer: Medicare HMO | Admitting: Anesthesiology

## 2018-04-18 ENCOUNTER — Inpatient Hospital Stay: Payer: Self-pay

## 2018-04-18 ENCOUNTER — Encounter
Admission: EM | Disposition: A | Discharge status: Home Health | Payer: Self-pay | Source: Home / Self Care | Attending: Internal Medicine

## 2018-04-18 DIAGNOSIS — K3189 Other diseases of stomach and duodenum: Secondary | ICD-10-CM

## 2018-04-18 DIAGNOSIS — I120 Hypertensive chronic kidney disease with stage 5 chronic kidney disease or end stage renal disease: Secondary | ICD-10-CM

## 2018-04-18 DIAGNOSIS — K6389 Other specified diseases of intestine: Secondary | ICD-10-CM

## 2018-04-18 DIAGNOSIS — A4189 Other specified sepsis: Secondary | ICD-10-CM

## 2018-04-18 DIAGNOSIS — D72829 Elevated white blood cell count, unspecified: Secondary | ICD-10-CM

## 2018-04-18 DIAGNOSIS — D696 Thrombocytopenia, unspecified: Secondary | ICD-10-CM

## 2018-04-18 DIAGNOSIS — D125 Benign neoplasm of sigmoid colon: Secondary | ICD-10-CM

## 2018-04-18 DIAGNOSIS — D122 Benign neoplasm of ascending colon: Secondary | ICD-10-CM

## 2018-04-18 DIAGNOSIS — R1084 Generalized abdominal pain: Secondary | ICD-10-CM

## 2018-04-18 DIAGNOSIS — K635 Polyp of colon: Secondary | ICD-10-CM

## 2018-04-18 DIAGNOSIS — Z992 Dependence on renal dialysis: Secondary | ICD-10-CM

## 2018-04-18 DIAGNOSIS — D123 Benign neoplasm of transverse colon: Secondary | ICD-10-CM

## 2018-04-18 DIAGNOSIS — R1900 Intra-abdominal and pelvic swelling, mass and lump, unspecified site: Secondary | ICD-10-CM

## 2018-04-18 DIAGNOSIS — D490 Neoplasm of unspecified behavior of digestive system: Secondary | ICD-10-CM

## 2018-04-18 DIAGNOSIS — N186 End stage renal disease: Secondary | ICD-10-CM

## 2018-04-18 DIAGNOSIS — D649 Anemia, unspecified: Secondary | ICD-10-CM

## 2018-04-18 DIAGNOSIS — R197 Diarrhea, unspecified: Secondary | ICD-10-CM

## 2018-04-18 HISTORY — PX: ESOPHAGOGASTRODUODENOSCOPY: SHX5428

## 2018-04-18 HISTORY — PX: COLONOSCOPY: SHX5424

## 2018-04-18 LAB — CBC
HCT: 28.7 % — ABNORMAL LOW (ref 40.0–52.0)
Hemoglobin: 10 g/dL — ABNORMAL LOW (ref 13.0–18.0)
MCH: 32.6 pg (ref 26.0–34.0)
MCHC: 34.8 g/dL (ref 32.0–36.0)
MCV: 93.7 fL (ref 80.0–100.0)
Platelets: 161 10*3/uL (ref 150–440)
RBC: 3.07 MIL/uL — ABNORMAL LOW (ref 4.40–5.90)
RDW: 14.2 % (ref 11.5–14.5)
WBC: 12.2 10*3/uL — ABNORMAL HIGH (ref 3.8–10.6)

## 2018-04-18 LAB — HIV ANTIBODY (ROUTINE TESTING W REFLEX): HIV Screen 4th Generation wRfx: NONREACTIVE

## 2018-04-18 SURGERY — EGD (ESOPHAGOGASTRODUODENOSCOPY)
Anesthesia: General

## 2018-04-18 MED ORDER — PROPOFOL 500 MG/50ML IV EMUL
INTRAVENOUS | Status: AC
  Filled 2018-04-18: qty 50

## 2018-04-18 MED ORDER — PROPOFOL 10 MG/ML IV BOLUS
INTRAVENOUS | Status: DC | PRN
  Administered 2018-04-18 (×2): 80 mg via INTRAVENOUS

## 2018-04-18 MED ORDER — PROPOFOL 10 MG/ML IV BOLUS
INTRAVENOUS | Status: AC
  Filled 2018-04-18: qty 20

## 2018-04-18 MED ORDER — LIDOCAINE HCL (PF) 2 % IJ SOLN
INTRAMUSCULAR | Status: AC
  Filled 2018-04-18: qty 10

## 2018-04-18 MED ORDER — CHLORHEXIDINE GLUCONATE CLOTH 2 % EX PADS: 6.0000 | MEDICATED_PAD | Freq: Once | CUTANEOUS | Status: DC

## 2018-04-18 MED ORDER — PROPOFOL 500 MG/50ML IV EMUL
INTRAVENOUS | Status: DC | PRN
  Administered 2018-04-18: 120 ug/kg/min via INTRAVENOUS

## 2018-04-18 MED ORDER — LIDOCAINE HCL (CARDIAC) PF 100 MG/5ML IV SOSY
PREFILLED_SYRINGE | INTRAVENOUS | Status: DC | PRN
  Administered 2018-04-18: 100 mg via INTRAVENOUS

## 2018-04-18 MED ORDER — IOHEXOL 300 MG/ML  SOLN
75.0000 mL | Freq: Once | INTRAMUSCULAR | Status: AC | PRN
  Administered 2018-04-18: 75 mL via INTRAVENOUS

## 2018-04-18 NOTE — Consult Note (Signed)
Subjective:   CC: colon mass  HPI:  Jonathan Mcgrath is a 77 y.o. male who is consulted by [facility] for evaluation of above cc.  Noted on recent endoscopy during workup for diarrhea, rule out GI malignancy.  Denies melena, hematochizia.  Associated with diarrhea, otherwise no other GI symptoms.  Currently receiving abx for his recent clostridium infection.   Past Medical History:  has a past medical history of Chronic kidney disease, Hemodialysis access site with arteriovenous graft ([place]), Hypertension, and Renal insufficiency.  Past Surgical History:  has a past surgical history that includes AV fistula placement (Left, 2015); Peritoneal catheter insertion (N/A); Removal of a dialysis catheter (N/A, 01/14/2015); Cardiac catheterization (Left, 01/12/2016); and Cardiac catheterization (N/A, 01/12/2016).  Family History: family history includes Diabetes in his mother; Heart disease in his father.  Social History:  reports that he has never smoked. He has never used smokeless tobacco. He reports that he does not drink alcohol or use drugs.  Current Medications:  Medications Prior to Admission  Medication Sig Dispense Refill  . cinacalcet (SENSIPAR) 30 MG tablet Take 30 mg by mouth every morning.    . ferric citrate (AURYXIA) 1 GM 210 MG(Fe) tablet Take 1 tablet by mouth daily.    . multivitamin (RENA-VIT) TABS tablet Take 1 tablet by mouth daily.  11    Allergies:  Allergies as of 04/12/2018  . (No Known Allergies)    ROS:  A 15 point review of systems was performed and pertinent positives and negatives noted in HPI    Objective:     BP (!) 167/72 (BP Location: Right Arm)   Pulse 85   Temp 98.3 F (36.8 C) (Oral)   Resp 18   Ht 6\' 1"  (1.854 m)   Wt 96.4 kg   SpO2 95%   BMI 28.04 kg/m    Constitutional :  alert, cooperative, appears stated age and no distress  Lymphatics/Throat:  no asymmetry, masses, or scars  Respiratory:  clear to auscultation bilaterally   Cardiovascular:  regular rate and rhythm  Gastrointestinal: soft, non-tender; bowel sounds normal; no masses,  no organomegaly.   Musculoskeletal: Steady gait and movement  Skin: Cool and moist  Psychiatric: Normal affect, non-agitated, not confused       LABS:  CMP Latest Ref Rng & Units 04/16/2018 04/13/2018 04/12/2018  Glucose 70 - 99 mg/dL 108(H) 98 114(H)  BUN 8 - 23 mg/dL 26(H) 23 50(H)  Creatinine 0.61 - 1.24 mg/dL 6.59(H) 5.89(H) 10.26(H)  Sodium 135 - 145 mmol/L 138 139 141  Potassium 3.5 - 5.1 mmol/L 3.8 3.8 4.5  Chloride 98 - 111 mmol/L 97(L) 98 97(L)  CO2 22 - 32 mmol/L 32 32 27  Calcium 8.9 - 10.3 mg/dL 8.0(L) 8.4(L) 8.8(L)  Total Protein 6.5 - 8.1 g/dL - - 6.7  Total Bilirubin 0.3 - 1.2 mg/dL - - 1.2  Alkaline Phos 38 - 126 U/L - - 77  AST 15 - 41 U/L - - 24  ALT 0 - 44 U/L - - 17   CBC Latest Ref Rng & Units 04/18/2018 04/16/2018 04/15/2018  WBC 3.8 - 10.6 K/uL 12.2(H) 12.2(H) 14.6(H)  Hemoglobin 13.0 - 18.0 g/dL 10.0(L) 10.2(L) 10.3(L)  Hematocrit 40.0 - 52.0 % 28.7(L) 29.4(L) 31.0(L)  Platelets 150 - 440 K/uL 161 113(L) 130(L)     RADS: cscope results - Rule out malignancy, tumor in the ascending colon and in the cecum. Biopsied. (Sample sent as STAT to pathology) - Two 4 to 7 mm,  non-bleeding polyps in the ascending colon, removed with a cold snare and removed with a hot snare. Resected and retrieved. (See above description for details of the contents of this jar) - One 10 mm polyp in the ascending colon, removed with a hot snare. Resected and retrieved. - Thickened folds of the mucosa at the hepatic flexure. Biopsied. - Nine 4 to 7 mm, non-bleeding polyps in the transverse colon, removed with a cold snare and removed with a hot snare. Resected and retrieved. - One 7 mm polyp in the sigmoid colon, removed with a hot snare. Resected and retrieved. - The examination was otherwise normal. - The distal rectum and anal verge are normal on retroflexion  view. Assessment:      Colon mass, multiple polyps  Plan:      Await biopsy results, likely cancer.  Will discuss with patient in more detail surgical options, pre-operative workup required once path results make it official.  No need to continue inhouse stay to proceed with like elective colon resection surgery since it is a non-obstructing mass with no sign of active bleeding per cscope report.  If still inhouse when path results come back and confirms adenoCA, recommend CEA, and likely genetic testing if >10 polyps are adenomas on path report.  If patient is discharged prior to final path, he can followup in office.  Will also need to discuss with ID timing of surgery while he is receiving treatment for bacteremia.  Surgery will peripherally follow.

## 2018-04-18 NOTE — Progress Notes (Signed)
Jonathan Antigua, MD [address] [place], [place], [place], [address] [address], [place], [place], [place], [address] Phone: [phone]  Fax: [phone]   Subjective: Pt completed entire prep. No melena or hematochezia.  No abdominal pain.  No nausea or vomiting.   Objective: Exam: Vital signs in last 24 hours: Vitals:   04/17/18 2123 04/17/18 2130 04/17/18 2342 04/18/18 0743  BP: (!) 141/76 (!) 176/67 (!) 152/81 135/75  Pulse: 81 78 92 82  Resp: 17 19 17    Temp:  98.3 F (36.8 C) 97.9 F (36.6 C) 98.1 F (36.7 C)  TempSrc:  Oral Oral Oral  SpO2: 98% 98% 94% 98%  Weight:  96.4 kg    Height:       Weight change:   Intake/Output Summary (Last 24 hours) at 04/18/2018 0950 Last data filed at 04/18/2018 0500 Gross per 24 hour  Intake 910.95 ml  Output 1042 ml  Net -131.05 ml    General: No acute distress, AAO x3 Abd: Soft, NT/ND, No HSM Skin: Warm, no rashes Neck: Supple, Trachea midline   Lab Results: Lab Results  Component Value Date   WBC 12.2 (H) 04/16/2018   HGB 10.2 (L) 04/16/2018   HCT 29.4 (L) 04/16/2018   MCV 93.3 04/16/2018   PLT 113 (L) 04/16/2018   Micro Results: Recent Results (from the past 240 hour(s))  Culture, blood (Routine x 2)     Status: Abnormal   Collection Time: 04/12/18  6:37 AM  Result Value Ref Range Status   Specimen Description   Final    BLOOD RIGHT ARM Performed at [facility], [address] [place], [place] [address]    Special Requests   Final    BOTTLES DRAWN AEROBIC AND ANAEROBIC Blood Culture adequate volume Performed at [facility], [address] [place], [place] [address]    Culture  Setup Time   Final    GRAM POSITIVE RODS ANAEROBIC BOTTLE ONLY CRITICAL RESULT CALLED TO, READ BACK BY AND VERIFIED WITH: DAVID BESANTI AT 2358 04/12/18.PMH Performed at [facility], [address] [place], [place] [address]    Culture CLOSTRIDIUM SPECIES (A)  Final   Report Status  04/16/2018 FINAL  Final  Culture, blood (Routine x 2)     Status: Abnormal   Collection Time: 04/12/18  6:39 AM  Result Value Ref Range Status   Specimen Description   Final    BLOOD RIGHT HAND Performed at [facility], [address] [place], [place] [address]    Special Requests   Final    BOTTLES DRAWN AEROBIC AND ANAEROBIC Blood Culture adequate volume Performed at [facility], [address] [place], [place] [address]    Culture  Setup Time   Final    ANAEROBIC BOTTLE ONLY GRAM POSITIVE RODS CRITICAL VALUE NOTED.  VALUE IS CONSISTENT WITH PREVIOUSLY REPORTED AND CALLED VALUE. Performed at [facility], [address] [place], [place] [address]    Culture CLOSTRIDIUM SEPTICUM (A)  Final   Report Status 04/16/2018 FINAL  Final  Gastrointestinal Panel by PCR , Stool     Status: Abnormal   Collection Time: 04/12/18  7:02 AM  Result Value Ref Range Status   Campylobacter species NOT DETECTED NOT DETECTED Final   Plesimonas shigelloides NOT DETECTED NOT DETECTED Final   Salmonella species NOT DETECTED NOT DETECTED Final   Yersinia enterocolitica NOT DETECTED NOT DETECTED Final   Vibrio species NOT DETECTED NOT DETECTED Final  Vibrio cholerae NOT DETECTED NOT DETECTED Final   Enteroaggregative E coli (EAEC) NOT DETECTED NOT DETECTED Final   Enteropathogenic E coli (EPEC) DETECTED (A) NOT DETECTED Final    Comment: RESULT CALLED TO, READ BACK BY AND VERIFIED WITH: MATTHEW WRIGHT 04/12/18 @ 1751  [place] E coli (ETEC) NOT DETECTED NOT DETECTED Final   Shiga like toxin producing E coli (STEC) NOT DETECTED NOT DETECTED Final   Shigella/Enteroinvasive E coli (EIEC) NOT DETECTED NOT DETECTED Final   Cryptosporidium NOT DETECTED NOT DETECTED Final   Cyclospora cayetanensis NOT DETECTED NOT DETECTED Final   Entamoeba histolytica NOT DETECTED NOT DETECTED Final   Giardia lamblia NOT DETECTED NOT DETECTED Final   Adenovirus F40/41 NOT  DETECTED NOT DETECTED Final   Astrovirus NOT DETECTED NOT DETECTED Final   Norovirus GI/GII NOT DETECTED NOT DETECTED Final   Rotavirus A NOT DETECTED NOT DETECTED Final   Sapovirus (I, II, IV, and V) NOT DETECTED NOT DETECTED Final    Comment: Performed at [facility], [address] [place], [place] [address]  C difficile quick scan w PCR reflex     Status: Abnormal   Collection Time: 04/12/18  7:02 AM  Result Value Ref Range Status   C Diff antigen POSITIVE (A) NEGATIVE Final   C Diff toxin NEGATIVE NEGATIVE Final   C Diff interpretation Results are indeterminate. See PCR results.  Final    Comment: Performed at [facility], [address] [place], [place] [address]  C. Diff by PCR, Reflexed     Status: None   Collection Time: 04/12/18  7:02 AM  Result Value Ref Range Status   Toxigenic C. Difficile by PCR NEGATIVE NEGATIVE Final    Comment: Patient is colonized with non toxigenic C. difficile. May not need treatment unless significant symptoms are present. Performed at [facility], [address] [place], [place] [address]   MRSA PCR Screening     Status: None   Collection Time: 04/12/18  7:00 PM  Result Value Ref Range Status   MRSA by PCR NEGATIVE NEGATIVE Final    Comment:        The GeneXpert MRSA Assay (FDA approved for NASAL specimens only), is one component of a comprehensive MRSA colonization surveillance program. It is not intended to diagnose MRSA infection nor to guide or monitor treatment for MRSA infections. Performed at [facility], [address] [place], [place] [address]   Culture, blood (Routine X 2) w Reflex to ID Panel     Status: None (Preliminary result)   Collection Time: 04/17/18 12:53 AM  Result Value Ref Range Status   Specimen Description BLOOD BLOOD RIGHT ARM  Final   Special Requests   Final    BOTTLES DRAWN AEROBIC AND ANAEROBIC Blood Culture adequate volume   Culture   Final    NO GROWTH 1  DAY Performed at [facility], [address], [place], [place] [address]    Report Status PENDING  Incomplete  Culture, blood (Routine X 2) w Reflex to ID Panel     Status: None (Preliminary result)   Collection Time: 04/17/18 12:59 AM  Result Value Ref Range Status   Specimen Description BLOOD BLOOD RIGHT WRIST  Final   Special Requests   Final    BOTTLES DRAWN AEROBIC AND ANAEROBIC Blood Culture adequate volume   Culture   Final    NO GROWTH 1 DAY Performed at [facility], [address] [place], [place] [address]    Report Status PENDING  Incomplete   Studies/Results: No results found. Medications:  Scheduled Meds: . [MAR Hold] amLODipine  5 mg Oral Daily  . [MAR Hold] Chlorhexidine Gluconate Cloth  6 each Topical Q0600  . [MAR Hold] Chlorhexidine Gluconate Cloth  6 each Topical Once  . [MAR Hold] epoetin (EPOGEN/PROCRIT) injection  4,000 Units Intravenous Q M,W,F-HD  . [MAR Hold] multivitamin  1 tablet Oral Daily  . [MAR Hold] sodium chloride flush  3 mL Intravenous Q12H   Continuous Infusions: . [MAR Hold] sodium chloride Stopped (04/16/18 0700)  . sodium chloride Stopped (04/17/18 1223)  . sodium chloride    . [MAR Hold] ampicillin-sulbactam (UNASYN) IV 3 g (04/18/18 0029)  . [MAR Hold] famotidine (PEPCID) IV Stopped (04/17/18 1532)   PRN Meds:.[MAR Hold] sodium chloride, [MAR Hold] acetaminophen **OR** [MAR Hold] acetaminophen, [MAR Hold] bisacodyl, [MAR Hold] calcium carbonate, [MAR Hold] hydrALAZINE, [MAR Hold] HYDROcodone-acetaminophen, [MAR Hold] ondansetron **OR** [MAR Hold] ondansetron (ZOFRAN) IV, [MAR Hold] sodium chloride flush   Assessment: 77 year old male with positive blood cultures with Clostridium septicum and no prior colonoscopy   Plan: Patient willing to proceed with EGD and colonoscopy today to rule out GI malignancy given blood culture findings Recent repeat blood culture from 10/2, with no growth so far ID  following Patient's fever has resolved at this time, and 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  I have discussed alternative options, risks & benefits,  which include, but are not limited to, bleeding, infection, perforation,respiratory complication & drug reaction.  The patient agrees with this plan & written consent will be obtained.      LOS: 6 days   Jonathan Antigua, MD 04/18/2018, 9:50 AM

## 2018-04-18 NOTE — Progress Notes (Signed)
Jonathan Mcgrath is a 77 y.o. with a history of ESRD, HTN , DM2 Admitted  due to nausea and vomiitng, diarrhea and fever with chills.  Pt was doing well on wed morning when he went for dialysis, got the flu vaccine, when he returned home he has some nausea and vomiting but had to attend a funeral. He then developed diarrhea. Stayed home on Thursday and on Friday  he went back to  dialysis and  Was shivering Found to have a temp and sent to ED. Temp of 102.1 in the ED  Had Stool tests which showed enteropathogenic ecoli, and cdiff antigen positive but negative toxin and Neg PCR. Blood cultures were sent and he was started on vanco and cefepime and flagyl .Marland Kitchen He had a CT abdomen which showed Mild inflammatory changes surrounding the inferior mesenteric artery and inferior mesenteric vein along the anterior aspect of the aorta and left common iliac artery. No focal fluid collection is noted. He had CTA on 9/30 which showed Circumferential arterial wall thickening beginning at the aortic bifurcation and extending along the left greater than right common iliac arteries. There are associated inflammatory changes with inflammatory stranding in the immediately adjacent retroperitoneal fat and left pelvic sidewall. 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 which may be infectious, or inflammatory in etiology   ?Subjective Had colonoscopy today Objective:  VITALS:  Patient Vitals for the past 24 hrs:  BP Temp Temp src Pulse Resp SpO2 Weight  04/18/18 1624 (!) 167/72 98.3 F (36.8 C) Oral 85 - 91 % -  04/18/18 1245 (!) 167/79 - - 82 18 92 % -  04/18/18 1156 (!) 181/51 - - - - - -  04/18/18 1146 (!) 174/47 - - - - - -  04/18/18 1136 (!) 157/46 (!) 97.3 F (36.3 C) Tympanic 75 17 99 % -  04/18/18 0743 135/75 98.1 F (36.7 C) Oral 82 - 98 % -  04/17/18 2342 (!) 152/81 97.9 F (36.6 C) Oral 92 17 94 % -  04/17/18 2130 (!) 176/67 98.3 F (36.8 C) Oral 78 19 98 % 96.4 kg    04/17/18 2123 (!) 141/76 - - 81 17 98 % -  04/17/18 2100 (!) 162/80 - - 77 17 94 % -  04/17/18 2045 (!) 180/67 - - 78 16 96 % -  04/17/18 2030 (!) 172/69 - - 76 (!) 21 94 % -  04/17/18 2015 (!) 175/65 - - 76 18 96 % -  04/17/18 2000 (!) 187/67 - - 77 17 91 % -  04/17/18 1945 (!) 168/72 - - 81 14 98 % -  04/17/18 1930 (!) 171/66 - - 79 (!) 21 95 % -  04/17/18 1915 (!) 167/66 - - 74 16 96 % -  04/17/18 1900 (!) 165/64 - - 71 17 93 % -  04/17/18 1845 (!) 166/67 - - 73 17 95 % -  04/17/18 1830 (!) 167/68 - - 72 19 93 % -  04/17/18 1815 (!) 161/71 - - 73 16 94 % -  04/17/18 1800 (!) 157/68 - - 72 18 95 % -  04/17/18 1745 (!) 172/69 - - 73 18 91 % -  04/17/18 1734 (!) 156/67 (!) 97.3 F (36.3 C) Oral 72 17 94 % 97.3 kg     PHYSICAL EXAM:  General: Alert, cooperative, no distress, appears stated age.  Head: Normocephalic, without obvious abnormality, atraumatic. Eyes: Conjunctivae clear, anicteric sclerae. Pupils are equal ENT Nares normal. No  drainage or sinus tenderness. Lips, mucosa, and tongue normal. No Thrush Neck: Supple, symmetrical, no adenopathy, thyroid: non tender no carotid bruit and no JVD. Back: No CVA tenderness. Lungs: Clear to auscultation bilaterally. No Wheezing or Rhonchi. No rales. Heart: s1s2, murmur heard on the rt sternal border Abdomen: Soft, non-tender,not distended. Bowel sounds normal. No masses Extremities: atraumatic, no cyanosis. No edema. No clubbing Skin: No rashes or lesions. Or bruising Lymph: Cervical, supraclavicular normal. Neurologic: Grossly non-focal   Pertinent Labs CBC Latest Ref Rng & Units 04/18/2018 04/16/2018 04/15/2018  WBC 3.8 - 10.6 K/uL 12.2(H) 12.2(H) 14.6(H)  Hemoglobin 13.0 - 18.0 g/dL 10.0(L) 10.2(L) 10.3(L)  Hematocrit 40.0 - 52.0 % 28.7(L) 29.4(L) 31.0(L)  Platelets 150 - 440 K/uL 161 113(L) 130(L)    CMP Latest Ref Rng & Units 04/16/2018 04/13/2018 04/12/2018  Glucose 70 - 99 mg/dL 108(H) 98 114(H)  BUN 8 - 23 mg/dL  26(H) 23 50(H)  Creatinine 0.61 - 1.24 mg/dL 6.59(H) 5.89(H) 10.26(H)  Sodium 135 - 145 mmol/L 138 139 141  Potassium 3.5 - 5.1 mmol/L 3.8 3.8 4.5  Chloride 98 - 111 mmol/L 97(L) 98 97(L)  CO2 22 - 32 mmol/L 32 32 27  Calcium 8.9 - 10.3 mg/dL 8.0(L) 8.4(L) 8.8(L)  Total Protein 6.5 - 8.1 g/dL - - 6.7  Total Bilirubin 0.3 - 1.2 mg/dL - - 1.2  Alkaline Phos 38 - 126 U/L - - 77  AST 15 - 41 U/L - - 24  ALT 0 - 44 U/L - - 17   bc- 9/27 clostridium septicum  IMAGING RESULTS: CT angio abdomen and pelvis ?Circumferential arterial wall thickening beginning at the aortic bifurcation and extending along the left greater than right common iliac arteries. There are associated inflammatory changes with inflammatory stranding in the immediately adjacent retroperitoneal fat and left pelvic sidewall. 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     Impression/Recommendation ?Admitted  due to nausea and vomiitng, diarrhea and fever with chills.  Pt was doing well on wed morning when he went for dialysis, got the flu vaccine, when he returned home he has some nausea and vomiting but had to attend a funeral. He then developed diarrhea. Stayed home on Thursday and on Friday  he went back to  dialysis and  Was shivering Found to have a temp and sent to ED.  Clostridium septicum bacteremia- this is a GI organism and associated with CRC in 25% of cases. He also has aortitis- (Circumferential arterial wall thickening beginning at the aortic bifurcation and extending along the left greater than right common iliac arteries.). Films reviewed with radiologist colonoscopy showed ascending colon and caecum circumferential mass. Repeat culture has been sent and if it remains neg  Then undergoing surgery should not be an issue  No evidence of gas gangrene ( as this organism can cause spontaneous gas gangrene) Continue unasyn No  fever or leucocytosis  The enteropathogenic ecoli in the stool PCR may have no  Significance or along with clostridium could cause a systemic infection by the way of translocation   Underlying leucocytosis since 2013 ( lymphocytosis)- could he have CLL?  Anemia- could be due to ESRD  Thrombocytopenia since 2018  All three lines are affected -could he have MDS? ? Need for bone marrow biopsy  Murmur - will need TEE  He will need long term antibiotics ( 4 weeks)  because of the vascular inflammation Will need unasyn IV q 12  ESRD on dialysis  Discussed the management with the patient and Dr.Wieting

## 2018-04-18 NOTE — Consult Note (Signed)
Hematology/Oncology Consult note [facility] Telephone:([phone] [phone]  Patient Care Team: [name], MD as PCP - General (Family Medicine)   Name of the [name]  [id number]  1941-05-28    Reason for consult: colon mass   Referring physician- Dr. Leslye Peer  Date of visit: 04/18/2018    History of presenting illness- Patient is a 77 yr old male with a PMH significant for HTN and ESRD on HD. He presented with fever, diarrhea and vomiting. Blood cultures showed clostridium septicum. CT abdomen showed aorto iliac arteritis. ID was consulted and they recommended colonoscopy as it can be associated with colon cancer. He has never had EGD or colonoscopy before. Colonoscopy showed non obstructing mass in the ascending colon. It has been biopsied and results are pending. CT chest abdomen pelvis did not reveal evidence of metastatic disease.   Patient currently reports left sided flank pain. Reports some fatigue. Denies other complaints  ECOG PS- 1-2  Pain scale- 3   Review of systems- Review of Systems  Constitutional: Positive for malaise/fatigue. Negative for chills, fever and weight loss.  HENT: Negative for congestion, ear discharge and nosebleeds.   Eyes: Negative for blurred vision.  Respiratory: Negative for cough, hemoptysis, sputum production, shortness of breath and wheezing.   Cardiovascular: Negative for chest pain, palpitations, orthopnea and claudication.  Gastrointestinal: Negative for abdominal pain, blood in stool, constipation, diarrhea, heartburn, melena, nausea and vomiting.       Left flank pain  Genitourinary: Negative for dysuria, flank pain, frequency, hematuria and urgency.  Musculoskeletal: Negative for back pain, joint pain and myalgias.  Skin: Negative for rash.  Neurological: Negative for dizziness, tingling, focal weakness, seizures, weakness and headaches.  Endo/Heme/Allergies: Does not  bruise/bleed easily.  Psychiatric/Behavioral: Negative for depression and suicidal ideas. The patient does not have insomnia.     No Known Allergies  Patient Active Problem List   Diagnosis Date Noted  . Benign neoplasm of ascending colon   . Benign neoplasm of transverse colon   . Polyp of sigmoid colon   . Colon neoplasm   . Mass of cecum   . Diarrhea   . Stomach irritation   . Abdominal pain 04/12/2018  . CAP (community acquired pneumonia) 08/17/2016     Past Medical History:  Diagnosis Date  . Chronic kidney disease   . Hemodialysis access site with arteriovenous graft ([place])   . Hypertension   . Renal insufficiency      Past Surgical History:  Procedure Laterality Date  . AV FISTULA PLACEMENT Left 2015   arm  . PERIPHERAL VASCULAR CATHETERIZATION Left 01/12/2016   Procedure: A/V Shuntogram/Fistulagram;  Surgeon: Algernon Huxley, MD;  Location: [place] [facility];  Service: Cardiovascular;  Laterality: Left;  . PERIPHERAL VASCULAR CATHETERIZATION N/A 01/12/2016   Procedure: A/V Shunt Intervention;  Surgeon: Algernon Huxley, MD;  Location: [place] [facility];  Service: Cardiovascular;  Laterality: N/A;  . PERITONEAL CATHETER INSERTION N/A   . REMOVAL OF A DIALYSIS CATHETER N/A 01/14/2015   Procedure: REMOVAL OF A  PERITONEAL DIALYSIS CATHETER;  Surgeon: Algernon Huxley, MD;  Location: [facility];  Service: Vascular;  Laterality: N/A;    Social History   Socioeconomic History  . Marital status: Married    Spouse name: Not on file  . Number of children: Not on file  . Years of education: Not on file  . Highest education level: Not on file  Occupational History  . Occupation: retired  Social Needs  . Financial resource strain: Not on file  . Food insecurity:    Worry: Not on file    Inability: Not on file  . Transportation needs:    Medical: Not on file    Non-medical: Not on file  Tobacco Use  . Smoking status: Never Smoker  . Smokeless tobacco: Never Used  Substance  and Sexual Activity  . Alcohol use: No  . Drug use: No  . Sexual activity: Not on file  Lifestyle  . Physical activity:    Days per week: Not on file    Minutes per session: Not on file  . Stress: Not on file  Relationships  . Social connections:    Talks on phone: Not on file    Gets together: Not on file    Attends religious service: Not on file    Active member of club or organization: Not on file    Attends meetings of clubs or organizations: Not on file    Relationship status: Not on file  . Intimate partner violence:    Fear of current or ex partner: Not on file    Emotionally abused: Not on file    Physically abused: Not on file    Forced sexual activity: Not on file  Other Topics Concern  . Not on file  Social History Narrative  . Not on file     Family History  Problem Relation Age of Onset  . Diabetes Mother   . Heart disease Father      Current Facility-Administered Medications:  .  0.9 %  sodium chloride infusion, 250 mL, Intravenous, PRN, Loletha Grayer, MD, Stopped at 04/16/18 0700 .  acetaminophen (TYLENOL) tablet 650 mg, 650 mg, Oral, Q6H PRN **OR** acetaminophen (TYLENOL) suppository 650 mg, 650 mg, Rectal, Q6H PRN, Wieting, Richard, MD .  amLODipine (NORVASC) tablet 5 mg, 5 mg, Oral, Daily, Wieting, Richard, MD, 5 mg at 04/18/18 1315 .  Ampicillin-Sulbactam (UNASYN) 3 g in sodium chloride 0.9 % 100 mL IVPB, 3 g, Intravenous, Q12H, Ravishankar, Jayashree, MD, Last Rate: 200 mL/hr at 04/18/18 1318, 3 g at 04/18/18 1318 .  bisacodyl (DULCOLAX) EC tablet 10 mg, 10 mg, Oral, Daily PRN, Vonda Antigua B, MD .  calcium carbonate (TUMS - dosed in mg elemental calcium) chewable tablet 200-400 mg of elemental calcium, 1-2 tablet, Oral, BID PRN, Arta Silence, MD .  Chlorhexidine Gluconate Cloth 2 % PADS 6 each, 6 each, Topical, Q0600, Loletha Grayer, MD, 6 each at 04/18/18 0001 .  Chlorhexidine Gluconate Cloth 2 % PADS 6 each, 6 each, Topical, Once,  Wieting, Richard, MD .  epoetin alfa (EPOGEN,PROCRIT) injection 4,000 Units, 4,000 Units, Intravenous, Q M,W,F-HD, Murlean Iba, MD, 4,000 Units at 04/17/18 1906 .  famotidine (PEPCID) IVPB 20 mg premix, 20 mg, Intravenous, Q24H, Wieting, Richard, MD, Last Rate: 100 mL/hr at 04/18/18 1509, 20 mg at 04/18/18 1509 .  hydrALAZINE (APRESOLINE) injection 10 mg, 10 mg, Intravenous, Q6H PRN, Wieting, Richard, MD .  HYDROcodone-acetaminophen (NORCO/VICODIN) 5-325 MG per tablet 1-2 tablet, 1-2 tablet, Oral, Q4H PRN, Leslye Peer, Richard, MD .  multivitamin (RENA-VIT) tablet 1 tablet, 1 tablet, Oral, Daily, Loletha Grayer, MD, 1 tablet at 04/18/18 1315 .  ondansetron (ZOFRAN) tablet 4 mg, 4 mg, Oral, Q6H PRN, 4 mg at 04/14/18 0748 **OR** ondansetron (ZOFRAN) injection 4 mg, 4 mg, Intravenous, Q6H PRN, Loletha Grayer, MD, 4 mg at 04/17/18 0455 .  sodium chloride flush (NS) 0.9 % injection 3 mL, 3 mL, Intravenous, Q12H,  Loletha Grayer, MD, 3 mL at 04/17/18 2245 .  sodium chloride flush (NS) 0.9 % injection 3 mL, 3 mL, Intravenous, PRN, Loletha Grayer, MD   Physical exam:  Vitals:   04/18/18 1146 04/18/18 1156 04/18/18 1245 04/18/18 1624  BP: (!) 174/47 (!) 181/51 (!) 167/79 (!) 167/72  Pulse:   82 85  Resp:   18   Temp:    98.3 F (36.8 C)  TempSrc:    Oral  SpO2:   92% 91%  Weight:      Height:       Physical Exam  Constitutional: He is oriented to person, place, and time. He appears well-developed and well-nourished.  HENT:  Head: Normocephalic and atraumatic.  Eyes: Pupils are equal, round, and reactive to light. EOM are normal.  Neck: Normal range of motion.  Cardiovascular: Normal rate and regular rhythm.  systolic murmur +  Pulmonary/Chest: Effort normal and breath sounds normal.  Abdominal: Soft. Bowel sounds are normal.  Neurological: He is alert and oriented to person, place, and time.  Skin: Skin is warm and dry.       CMP Latest Ref Rng & Units 04/16/2018  Glucose 70 - 99  mg/dL 108(H)  BUN 8 - 23 mg/dL 26(H)  Creatinine 0.61 - 1.24 mg/dL 6.59(H)  Sodium 135 - 145 mmol/L 138  Potassium 3.5 - 5.1 mmol/L 3.8  Chloride 98 - 111 mmol/L 97(L)  CO2 22 - 32 mmol/L 32  Calcium 8.9 - 10.3 mg/dL 8.0(L)  Total Protein 6.5 - 8.1 g/dL -  Total Bilirubin 0.3 - 1.2 mg/dL -  Alkaline Phos 38 - 126 U/L -  AST 15 - 41 U/L -  ALT 0 - 44 U/L -   CBC Latest Ref Rng & Units 04/18/2018  WBC 3.8 - 10.6 K/uL 12.2(H)  Hemoglobin 13.0 - 18.0 g/dL 10.0(L)  Hematocrit 40.0 - 52.0 % 28.7(L)  Platelets 150 - 440 K/uL 161    @IMAGES @  Ct Abdomen Pelvis Wo Contrast  Result Date: 04/12/2018 CLINICAL DATA:  Fever and chills, nausea and diarrhea EXAM: CT ABDOMEN AND PELVIS WITHOUT CONTRAST TECHNIQUE: Multidetector CT imaging of the abdomen and pelvis was performed following the standard protocol without IV contrast. COMPARISON:  01/28/2011. FINDINGS: Lower chest: Lung bases are clear. Moderate-sized hiatal hernia is noted. Hepatobiliary: Tiny dependent gallstones are noted. The liver is within normal limits. Pancreas: Unremarkable. No pancreatic ductal dilatation or surrounding inflammatory changes. Spleen: Normal in size without focal abnormality. Adrenals/Urinary Tract: Adrenal glands are within normal limits. The kidneys are well visualized and demonstrate tiny renal calculi bilaterally. Some vascular calcifications are noted as well. In the upper pole of the left kidney there is a vague hypodensity which measures 3.2 cm in greatest dimension. This was seen on a prior CT examination measuring 2.6 cm. Bladder is well distended. No obstructive changes are seen. Stomach/Bowel: Minimal diverticular change of the colon is noted. No obstructive or inflammatory changes of the colon are seen. The appendix is well visualized and within normal limits. No obstructive small bowel changes are seen. Vascular/Lymphatic: Aortic calcifications are noted. No significant lymphadenopathy is identified.  Reproductive: Prostate is unremarkable. Other: No free fluid is noted within the abdomen or pelvis. Along the anterior aspect of the distal aorta and proximal common iliac artery there is some vague inflammatory changes surrounding the mesenteric vessels. No significant lymphadenopathy is noted. No focal fluid collection is seen. Musculoskeletal: Degenerative changes of lumbar spine are noted. IMPRESSION: Mild inflammatory changes surrounding the inferior  mesenteric artery and inferior mesenteric vein along the anterior aspect of the aorta and left common iliac artery. No focal fluid collection is noted. The etiology is uncertain as no inflamed loops of bowel are noted adjacent to these changes. This may be related to some localized venous thrombosis. Moderate-sized hiatal hernia. Cholelithiasis without complicating factors. Hypodensity in the upper pole of the left kidney which is larger than that seen in 2012 but likely represents a complicated cyst. Nonemergent ultrasound may be helpful for further evaluation. Electronically Signed   By: Inez Catalina M.D.   On: 04/12/2018 07:52   Dg Chest 2 View  Result Date: 04/12/2018 CLINICAL DATA:  77 year old male with a history of nausea vomiting EXAM: CHEST - 2 VIEW COMPARISON:  09/24/2017 FINDINGS: Cardiomediastinal silhouette unchanged in size and contour. Mild interlobular septal thickening. No pleural effusion or pneumothorax. No confluent airspace disease. Stent within left upper extremity vasculature. No displaced fracture.  Degenerative changes of the spine. IMPRESSION: Mild interlobular septal thickening may represent chronic changes in the setting of prior episodes of CHF, and/or mild acute edema. Electronically Signed   By: Corrie Mckusick D.O.   On: 04/12/2018 07:08   Ct Chest W Contrast  Result Date: 04/18/2018 CLINICAL DATA:  Recent diagnosis of colon cancer.  Staging exam. EXAM: CT CHEST WITH CONTRAST TECHNIQUE: Multidetector CT imaging of the chest was  performed during intravenous contrast administration. CONTRAST:  75 mL OMNIPAQUE IOHEXOL 300 MG/ML  SOLN COMPARISON:  PA and lateral chest 04/12/2018 and 08/16/2016. FINDINGS: Cardiovascular: Heart size is normal. No pericardial effusion. Scattered calcific aortic and coronary atherosclerotic calcifications are identified. Calcification of the aortic valve is also noted. Mediastinum/Nodes: No enlarged mediastinal, hilar, or axillary lymph nodes. Thyroid gland, trachea, and esophagus demonstrate no significant findings. Small to moderate hiatal hernia is identified. Lungs/Pleura: No pulmonary nodule, mass or consolidative process is identified. Minimal dependent atelectasis is noted. Trace left pleural effusion is seen. Upper Abdomen: Contrast material in the gallbladder consistent with vicarious excretion is noted. No acute abnormality is identified that cyst in the upper pole of the left kidney is unchanged. No acute abnormality is identified. Musculoskeletal: No lytic or sclerotic lesion is identified. Bilateral gynecomastia is seen. IMPRESSION: Negative for metastatic disease. Very small left pleural effusion. Small to moderate hiatal hernia. Calcific coronary artery disease. Gynecomastia. Aortic Atherosclerosis (ICD10-I70.0). Electronically Signed   By: Inge Rise M.D.   On: 04/18/2018 14:44   [place] Ekg Site Rite  Result Date: 04/18/2018 If Site Rite image not attached, placement could not be confirmed due to current cardiac rhythm.  Ct Angio Abd/pel W/ And/or W/o  Result Date: 04/15/2018 CLINICAL DATA:  77 year old male with nausea, vomiting, diarrhea and stool cultures positive for enteropathogenic E coli. EXAM: CT ANGIOGRAPHY ABDOMEN AND PELVIS WITH CONTRAST AND WITHOUT CONTRAST TECHNIQUE: Multidetector CT imaging of the abdomen and pelvis was performed using the standard protocol during bolus administration of intravenous contrast. Multiplanar reconstructed images and MIPs were obtained and  reviewed to evaluate the vascular anatomy. CONTRAST:  140mL ISOVUE-370 IOPAMIDOL (ISOVUE-370) INJECTION 76% COMPARISON:  CT scan of the abdomen and pelvis 04/12/2018 FINDINGS: VASCULAR Aorta: Mild heterogeneous atherosclerotic plaque. No evidence of aneurysm or dissection. Celiac: Patent without evidence of aneurysm, dissection, vasculitis or significant stenosis. SMA: Patent without evidence of aneurysm, dissection, vasculitis or significant stenosis. Renals: Both renal arteries are patent without evidence of aneurysm, dissection, vasculitis, fibromuscular dysplasia or significant stenosis. IMA: Patent without evidence of aneurysm, dissection, vasculitis or significant stenosis. Inflow: Circumferential  arterial wall thickening beginning at the aortic bifurcation and extending along both common iliac arteries, particularly the left. On the right, the inflammatory changes. Prior to the bifurcation. On the left, the inflammatory changes extend beyond the bifurcation and along the external iliac artery for a short distance. No significant stenosis, dissection or aneurysm. There is mild atherosclerotic plaque and irregularity along the left common iliac artery. Moderate focal plaque along the right common iliac artery. The external iliac arteries are relatively spared from disease. Proximal Outflow: Bilateral common femoral and visualized portions of the superficial and profunda femoral arteries are patent without evidence of aneurysm, dissection, vasculitis or significant stenosis. Veins: Venous opacification of the IVC and iliac veins is relatively limited on the portal venous phase imaging. No definite thrombus or asymmetry identified. The hepatic, portal and renal veins are all widely patent as are the splanchnic veins. Review of the MIP images confirms the above findings. NON-VASCULAR Lower chest: Incompletely imaged aortic valvular calcifications. Cardiomegaly. No pericardial effusion. Trace left-sided pleural  effusion and associated mild atelectasis. No pulmonary edema or focal airspace consolidation. Mild scarring in the inferior right middle lobe. Moderately large sliding hiatal hernia. Hepatobiliary: Geographic hypoattenuation in the left hemi-liver adjacent to the fissure for the falciform ligament is nonspecific but most suggestive of benign focal fatty infiltration. Otherwise, no solid hepatic lesion. Subtle gallstones were better visualized on the recent noncontrast enhanced CT scan. No intra or extrahepatic biliary ductal dilatation. Pancreas: Unremarkable. No pancreatic ductal dilatation or surrounding inflammatory changes. Spleen: Normal in size without focal abnormality. Adrenals/Urinary Tract: Normal adrenal glands. 2.3 cm nonenhancing simple cyst centrally in the upper pole of the left kidney. Punctate nonobstructing right lower pole nephrolithiasis again noted. No evidence of hydronephrosis. The left ureter passes through the inflammatory changes overlying the left iliac vessels without evidence of external compression. The right ureter is unremarkable. The bladder is within normal limits. Stomach/Bowel: No evidence of obstruction or focal bowel wall thickening. Normal appendix in the right lower quadrant. The terminal ileum is unremarkable. Lymphatic: No suspicious lymphadenopathy. Reproductive: Prostatomegaly. Other: Small fat containing umbilical hernia. a No abdominopelvic ascites. Musculoskeletal: No acute fracture or aggressive appearing lytic or blastic osseous lesion. Multilevel degenerative disc disease. There is a linear tract extending from the skin surface inferiorly over the left anterior abdominal wall. A small peripherally high density nodular region is present just superficial to the left rectus abdominus muscle. Given the configuration, this likely represents chronic changes related to the presence of a prior peritoneal dialysis catheter. IMPRESSION: VASCULAR 1. Circumferential arterial  wall thickening beginning at the aortic bifurcation and extending along the left greater than right common iliac arteries. There are associated inflammatory changes with inflammatory stranding in the immediately adjacent retroperitoneal fat and left pelvic sidewall. 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 Recommend vascular surgery consultation if not already obtained. 2. No evidence of deep venous thrombosis at this time. 3. Aortic Atherosclerosis (ICD10-170.0). 4. Cardiomegaly. 5. Aortic valvular calcifications. NON-VASCULAR 1. No acute nonvascular abnormality within the abdomen or pelvis. 2. Nonobstructing right lower pole nephrolithiasis. 3. Left upper pole simple renal cyst. 4. Trace left-sided pleural effusion. 5. Multilevel degenerative disc disease. 6. Small umbilical hernia. 7. Moderate hiatal hernia. 8. Additional ancillary findings as above. Signed, Criselda Peaches, MD, [facility] Electronically Signed   By: Jacqulynn Cadet M.D.   On: 04/15/2018 09:55    Assessment and plan-  Patient is a 77 y.o. male with ESRD on HD, HTN admitted for nausea/ vomiting found to have clostridium septicum bacteremia and colon mass  1. Colon mass: no evidence of metastatic disease. Despite being on hemodialysis, patient is independent of his ADL's. He does not have evidence of metastatic disease. Recommend surgical consult after biopsy results are finalized for resection of colon mass which is highly suspicious for colon cancer. Need clarification from ID as to when he can go for surgery if he needs IV unasyn. I did speak to ID Dr. Delaine Lame and she said he could go for surgery anytime from his bacteremia perspective.   I will see him 3 weeks from now in my office to discuss final pathology results and discussion about adjuvant chemotherapy - if he needs it at all  depending on path results  2. Normocytic anemia: likely secondary to ESRD. Will check iron studies, b12, folate  3. Patient also noted to have leucocytosis/ lymphocytosis and mild thrombocytopenia. I will order flow cytometry to rule out CLL   Thank you for this kind referral and the opportunity to participate in the care of this patient   Visit Diagnosis 1. Sepsis, due to unspecified organism                                                                                 2. Colon mass                                                                                3. lymphocytosis  Dr. Randa Evens, MD, MPH [facility] at [facility] [phone] 04/18/2018  4:45 PM

## 2018-04-18 NOTE — Anesthesia Preprocedure Evaluation (Addendum)
Anesthesia Evaluation  Patient identified by MRN, date of birth, ID band Patient awake    Reviewed: Allergy & Precautions, H&P , NPO status , reviewed documented beta blocker date and time   Airway Mallampati: II  TM Distance: >3 FB Neck ROM: full    Dental  (+) Chipped, Missing, Caps, Poor Dentition   Pulmonary pneumonia,    Pulmonary exam normal        Cardiovascular hypertension, Normal cardiovascular exam  ECHO 04/12/18 Study Conclusions  - Left ventricle: Wall thickness was increased in a pattern of   moderate LVH. Systolic function was normal. The estimated   ejection fraction was in the range of 60% to 65%. Doppler   parameters are consistent with abnormal left ventricular   relaxation (grade 1 diastolic dysfunction). - Aortic valve: There was trivial regurgitation. Valve area (VTI):   2.28 cm^2. Valve area (Vmax): 2.37 cm^2. Valve area (Vmean): 2.14   cm^2. - Mitral valve: There was mild regurgitation. Valve area by   continuity equation (using LVOT flow): 3.88 cm^2. - Right ventricle: Systolic pressure was increased. - Pulmonic valve: Peak gradient (S): 11 mm Hg. - Pulmonary arteries: PA peak pressure: 47 mm Hg (S).  Impressions:  - There was no evidence of a vegetation.   Neuro/Psych    GI/Hepatic   Endo/Other    Renal/GU Renal disease     Musculoskeletal   Abdominal   Peds  Hematology   Anesthesia Other Findings Past Medical History: No date: Chronic kidney disease No date: Hemodialysis access site with arteriovenous graft ([place]) No date: Hypertension No date: Renal insufficiency  HD done yesterday  Past Surgical History: 2015: AV FISTULA PLACEMENT; Left     Comment:  arm 01/12/2016: PERIPHERAL VASCULAR CATHETERIZATION; Left     Comment:  Procedure: A/V Shuntogram/Fistulagram;  Surgeon: Algernon Huxley, MD;  Location: [place] [facility];  Service:               Cardiovascular;   Laterality: Left; 01/12/2016: PERIPHERAL VASCULAR CATHETERIZATION; N/A     Comment:  Procedure: A/V Shunt Intervention;  Surgeon: Algernon Huxley, MD;  Location: [place] [facility];  Service:               Cardiovascular;  Laterality: N/A; No date: PERITONEAL CATHETER INSERTION; N/A 01/14/2015: REMOVAL OF A DIALYSIS CATHETER; N/A     Comment:  Procedure: REMOVAL OF A  PERITONEAL DIALYSIS CATHETER;                Surgeon: Algernon Huxley, MD;  Location: [facility];  Service:               Vascular;  Laterality: N/A;  BMI    Body Mass Index:  28.04 kg/m      Reproductive/Obstetrics                            Anesthesia Physical Anesthesia Plan  ASA: IV  Anesthesia Plan: General   Post-op Pain Management:    Induction: Intravenous  PONV Risk Score and Plan: 2 and Treatment may vary due to age or medical condition and TIVA  Airway Management Planned: Nasal Cannula and Natural Airway  Additional Equipment:   Intra-op Plan:   Post-operative Plan:   Informed Consent: I have reviewed the patients  History and Physical, chart, labs and discussed the procedure including the risks, benefits and alternatives for the proposed anesthesia with the patient or authorized representative who has indicated his/her understanding and acceptance.   Dental Advisory Given  Plan Discussed with:   Anesthesia Plan Comments:        Anesthesia Quick Evaluation

## 2018-04-18 NOTE — Progress Notes (Signed)
PT Progress Note:  Chart reviewed for attempt at treatment session.  Patient currently off unit for diagnostic testing.  Will re-attempt at later time/date as medically appropriate and available.  Jozi Malachi H. Owens Shark, PT, DPT, NCS 04/18/18, 9:34 AM [phone]

## 2018-04-18 NOTE — Transfer of Care (Signed)
Immediate Anesthesia Transfer of Care Note  [name]  Procedure(s) Performed: ESOPHAGOGASTRODUODENOSCOPY (EGD) (N/A ) COLONOSCOPY (N/A )  Patient Location: PACU  Anesthesia Type:General  Level of Consciousness: sedated  Airway & Oxygen Therapy: Patient Spontanous Breathing and Patient connected to nasal cannula oxygen  Post-op Assessment: Report given to RN and Post -op Vital signs reviewed and stable  Post vital signs: Reviewed and stable  Last Vitals:  Vitals Value Taken Time  BP    Temp    Pulse 78 04/18/2018 11:38 AM  Resp 15 04/18/2018 11:38 AM  SpO2 96 % 04/18/2018 11:38 AM  Vitals shown include unvalidated device data.  Last Pain:  Vitals:   04/18/18 0743  TempSrc: Oral  PainSc:          Complications: No apparent anesthesia complications

## 2018-04-18 NOTE — Consult Note (Signed)
[facility] Vascular Consult Note  Addendum to 04/12/18 Consult Note  Radiology Ct Abdomen Pelvis Wo Contrast  Result Date: 04/12/2018 CLINICAL DATA:  Fever and chills, nausea and diarrhea EXAM: CT ABDOMEN AND PELVIS WITHOUT CONTRAST TECHNIQUE: Multidetector CT imaging of the abdomen and pelvis was performed following the standard protocol without IV contrast. COMPARISON:  01/28/2011. FINDINGS: Lower chest: Lung bases are clear. Moderate-sized hiatal hernia is noted. Hepatobiliary: Tiny dependent gallstones are noted. The liver is within normal limits. Pancreas: Unremarkable. No pancreatic ductal dilatation or surrounding inflammatory changes. Spleen: Normal in size without focal abnormality. Adrenals/Urinary Tract: Adrenal glands are within normal limits. The kidneys are well visualized and demonstrate tiny renal calculi bilaterally. Some vascular calcifications are noted as well. In the upper pole of the left kidney there is a vague hypodensity which measures 3.2 cm in greatest dimension. This was seen on a prior CT examination measuring 2.6 cm. Bladder is well distended. No obstructive changes are seen. Stomach/Bowel: Minimal diverticular change of the colon is noted. No obstructive or inflammatory changes of the colon are seen. The appendix is well visualized and within normal limits. No obstructive small bowel changes are seen. Vascular/Lymphatic: Aortic calcifications are noted. No significant lymphadenopathy is identified. Reproductive: Prostate is unremarkable. Other: No free fluid is noted within the abdomen or pelvis. Along the anterior aspect of the distal aorta and proximal common iliac artery there is some vague inflammatory changes surrounding the mesenteric vessels. No significant lymphadenopathy is noted. No focal fluid collection is seen. Musculoskeletal: Degenerative changes of lumbar spine are noted. IMPRESSION: Mild inflammatory changes surrounding the inferior  mesenteric artery and inferior mesenteric vein along the anterior aspect of the aorta and left common iliac artery. No focal fluid collection is noted. The etiology is uncertain as no inflamed loops of bowel are noted adjacent to these changes. This may be related to some localized venous thrombosis. Moderate-sized hiatal hernia. Cholelithiasis without complicating factors. Hypodensity in the upper pole of the left kidney which is larger than that seen in 2012 but likely represents a complicated cyst. Nonemergent ultrasound may be helpful for further evaluation. Electronically Signed   By: Inez Catalina M.D.   On: 04/12/2018 07:52   Dg Chest 2 View  Result Date: 04/12/2018 CLINICAL DATA:  77 year old male with a history of nausea vomiting EXAM: CHEST - 2 VIEW COMPARISON:  09/24/2017 FINDINGS: Cardiomediastinal silhouette unchanged in size and contour. Mild interlobular septal thickening. No pleural effusion or pneumothorax. No confluent airspace disease. Stent within left upper extremity vasculature. No displaced fracture.  Degenerative changes of the spine. IMPRESSION: Mild interlobular septal thickening may represent chronic changes in the setting of prior episodes of CHF, and/or mild acute edema. Electronically Signed   By: Corrie Mckusick D.O.   On: 04/12/2018 07:08   Ct Chest W Contrast  Result Date: 04/18/2018 CLINICAL DATA:  Recent diagnosis of colon cancer.  Staging exam. EXAM: CT CHEST WITH CONTRAST TECHNIQUE: Multidetector CT imaging of the chest was performed during intravenous contrast administration. CONTRAST:  75 mL OMNIPAQUE IOHEXOL 300 MG/ML  SOLN COMPARISON:  PA and lateral chest 04/12/2018 and 08/16/2016. FINDINGS: Cardiovascular: Heart size is normal. No pericardial effusion. Scattered calcific aortic and coronary atherosclerotic calcifications are identified. Calcification of the aortic valve is also noted. Mediastinum/Nodes: No enlarged mediastinal, hilar, or axillary lymph nodes. Thyroid  gland, trachea, and esophagus demonstrate no significant findings. Small to moderate hiatal hernia is identified. Lungs/Pleura: No pulmonary nodule, mass or consolidative process is identified.  Minimal dependent atelectasis is noted. Trace left pleural effusion is seen. Upper Abdomen: Contrast material in the gallbladder consistent with vicarious excretion is noted. No acute abnormality is identified that cyst in the upper pole of the left kidney is unchanged. No acute abnormality is identified. Musculoskeletal: No lytic or sclerotic lesion is identified. Bilateral gynecomastia is seen. IMPRESSION: Negative for metastatic disease. Very small left pleural effusion. Small to moderate hiatal hernia. Calcific coronary artery disease. Gynecomastia. Aortic Atherosclerosis (ICD10-I70.0). Electronically Signed   By: Inge Rise M.D.   On: 04/18/2018 14:44   Ct Angio Abd/pel W/ And/or W/o  Result Date: 04/15/2018 CLINICAL DATA:  77 year old male with nausea, vomiting, diarrhea and stool cultures positive for enteropathogenic E coli. EXAM: CT ANGIOGRAPHY ABDOMEN AND PELVIS WITH CONTRAST AND WITHOUT CONTRAST TECHNIQUE: Multidetector CT imaging of the abdomen and pelvis was performed using the standard protocol during bolus administration of intravenous contrast. Multiplanar reconstructed images and MIPs were obtained and reviewed to evaluate the vascular anatomy. CONTRAST:  152mL ISOVUE-370 IOPAMIDOL (ISOVUE-370) INJECTION 76% COMPARISON:  CT scan of the abdomen and pelvis 04/12/2018 FINDINGS: VASCULAR Aorta: Mild heterogeneous atherosclerotic plaque. No evidence of aneurysm or dissection. Celiac: Patent without evidence of aneurysm, dissection, vasculitis or significant stenosis. SMA: Patent without evidence of aneurysm, dissection, vasculitis or significant stenosis. Renals: Both renal arteries are patent without evidence of aneurysm, dissection, vasculitis, fibromuscular dysplasia or significant stenosis. IMA:  Patent without evidence of aneurysm, dissection, vasculitis or significant stenosis. Inflow: Circumferential arterial wall thickening beginning at the aortic bifurcation and extending along both common iliac arteries, particularly the left. On the right, the inflammatory changes. Prior to the bifurcation. On the left, the inflammatory changes extend beyond the bifurcation and along the external iliac artery for a short distance. No significant stenosis, dissection or aneurysm. There is mild atherosclerotic plaque and irregularity along the left common iliac artery. Moderate focal plaque along the right common iliac artery. The external iliac arteries are relatively spared from disease. Proximal Outflow: Bilateral common femoral and visualized portions of the superficial and profunda femoral arteries are patent without evidence of aneurysm, dissection, vasculitis or significant stenosis. Veins: Venous opacification of the IVC and iliac veins is relatively limited on the portal venous phase imaging. No definite thrombus or asymmetry identified. The hepatic, portal and renal veins are all widely patent as are the splanchnic veins. Review of the MIP images confirms the above findings. NON-VASCULAR Lower chest: Incompletely imaged aortic valvular calcifications. Cardiomegaly. No pericardial effusion. Trace left-sided pleural effusion and associated mild atelectasis. No pulmonary edema or focal airspace consolidation. Mild scarring in the inferior right middle lobe. Moderately large sliding hiatal hernia. Hepatobiliary: Geographic hypoattenuation in the left hemi-liver adjacent to the fissure for the falciform ligament is nonspecific but most suggestive of benign focal fatty infiltration. Otherwise, no solid hepatic lesion. Subtle gallstones were better visualized on the recent noncontrast enhanced CT scan. No intra or extrahepatic biliary ductal dilatation. Pancreas: Unremarkable. No pancreatic ductal dilatation or  surrounding inflammatory changes. Spleen: Normal in size without focal abnormality. Adrenals/Urinary Tract: Normal adrenal glands. 2.3 cm nonenhancing simple cyst centrally in the upper pole of the left kidney. Punctate nonobstructing right lower pole nephrolithiasis again noted. No evidence of hydronephrosis. The left ureter passes through the inflammatory changes overlying the left iliac vessels without evidence of external compression. The right ureter is unremarkable. The bladder is within normal limits. Stomach/Bowel: No evidence of obstruction or focal bowel wall thickening. Normal appendix in the right lower quadrant. The terminal ileum  is unremarkable. Lymphatic: No suspicious lymphadenopathy. Reproductive: Prostatomegaly. Other: Small fat containing umbilical hernia. a No abdominopelvic ascites. Musculoskeletal: No acute fracture or aggressive appearing lytic or blastic osseous lesion. Multilevel degenerative disc disease. There is a linear tract extending from the skin surface inferiorly over the left anterior abdominal wall. A small peripherally high density nodular region is present just superficial to the left rectus abdominus muscle. Given the configuration, this likely represents chronic changes related to the presence of a prior peritoneal dialysis catheter. IMPRESSION: VASCULAR 1. Circumferential arterial wall thickening beginning at the aortic bifurcation and extending along the left greater than right common iliac arteries. There are associated inflammatory changes with inflammatory stranding in the immediately adjacent retroperitoneal fat and left pelvic sidewall. 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 Recommend vascular surgery consultation if not already obtained. 2. No evidence of deep venous thrombosis at this time. 3. Aortic Atherosclerosis (ICD10-170.0). 4. Cardiomegaly. 5. Aortic valvular calcifications.  NON-VASCULAR 1. No acute nonvascular abnormality within the abdomen or pelvis. 2. Nonobstructing right lower pole nephrolithiasis. 3. Left upper pole simple renal cyst. 4. Trace left-sided pleural effusion. 5. Multilevel degenerative disc disease. 6. Small umbilical hernia. 7. Moderate hiatal hernia. 8. Additional ancillary findings as above. Signed, Criselda Peaches, MD, [facility] Radiology Electronically Signed   By: Jacqulynn Cadet M.D.   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 Aorto Bi-Iliac Arteritis: Reviewed images with Dr. Lucky Cowboy. No indication for endovascular / surgical intervention. Recommend blood cultures (already done), ABX to treat positive blood cultures (already done), Sed Rate, CRP and possible consult to Rheumatology. The patient is at an increased risk for developing an inflammatory abdominal aortic aneurysm. We see the patient back in the office for an aortic duplex in three months. At this time, vascular surgery will sign off. Please reconsult if needed. Thank you.   Discussed with Dr. Mayme Genta, PA-C  04/18/2018 3:02 PM  This note was created with Dragon medical transcription system.  Any error is purely unintentional.

## 2018-04-18 NOTE — Op Note (Signed)
[facility] Patient Name: Jonathan Mcgrath Procedure Date: 04/18/2018 9:51 AM MRN: [id number] Account #: [id number] Date of Birth: 28-Sep-1940 Admit Type: Inpatient Age: 77 Room: [place] ENDO ROOM 2 Gender: Male Note Status: Finalized Procedure:            Upper GI endoscopy Indications:          Diarrhea, Rule out GI malignancy (positive clostridium                        species in blood culture) Providers:            Dillinger Aston B. Bonna Gains MD, MD Referring MD:         Forest Gleason Md, MD (Referring MD) Medicines:            Monitored Anesthesia Care Complications:        No immediate complications. Procedure:            Pre-Anesthesia Assessment:                       - The risks and benefits of the procedure and the                        sedation options and risks were discussed with the                        patient. All questions were answered and informed                        consent was obtained.                       - Patient identification and proposed procedure were                        verified prior to the procedure.                       - ASA Grade Assessment: II - A patient with mild                        systemic disease.                       After obtaining informed consent, the endoscope was                        passed under direct vision. Throughout the procedure,                        the patient's blood pressure, pulse, and oxygen                        saturations were monitored continuously. The Endoscope                        was introduced through the mouth, and advanced to the                        second part of duodenum. The upper GI endoscopy was  accomplished with ease. The patient tolerated the                        procedure well. Findings:      The gastroesophageal junction and examined esophagus were normal.      A single 4 mm papule (nodule) with no bleeding and no stigmata of recent        bleeding was found in the stomach. Biopsies were taken with a cold       forceps for histology.      The exam of the stomach was otherwise normal.      The examined duodenum was normal. Impression:           - Normal gastroesophageal junction and esophagus.                       - A single papule (nodule) found in the stomach.                        Biopsied.                       - Normal examined duodenum. Recommendation:       - Await pathology results.                       - Continue present medications.                       - Perform a colonoscopy today.                       - The findings and recommendations were discussed with                        the patient. Procedure Code(s):    --- Professional ---                       [phone], Esophagogastroduodenoscopy, flexible, transoral;                        with biopsy, single or multiple Diagnosis Code(s):    --- Professional ---                       K31.89, Other diseases of stomach and duodenum                       R19.7, Diarrhea, unspecified CPT copyright 2017 [employer]. All rights reserved. The codes documented in this report are preliminary and upon coder review may  be revised to meet current compliance requirements.  Vonda Antigua, MD Margretta Sidle B. Bonna Gains MD, MD 04/18/2018 10:15:40 AM This report has been signed electronically. Number of Addenda: 0 Note Initiated On: 04/18/2018 9:51 AM Estimated Blood Loss: Estimated blood loss: none.      [facility]

## 2018-04-18 NOTE — Progress Notes (Signed)
Patient ID: SAVIER TRICKETT, male   DOB: Oct 12, 1940, 77 y.o.   MRN: [id number]  [facility] PROGRESS NOTE  DARIYON URQUILLA QVZ:[id number] DOB: [birth year] DOA: 04/12/2018 PCP: [name], MD  HPI/Subjective: Patient still having a little abdominal discomfort.  Had colonoscopy today which revealed a colon mass.  Spoke with patient and son at the bedside and answered all questions I possibly could.  Objective: Vitals:   04/18/18 1156 04/18/18 1245  BP: (!) 181/51 (!) 167/79  Pulse:  82  Resp:  18  Temp:    SpO2:  92%    Filed Weights   04/15/18 1938 04/17/18 1734 04/17/18 2130  Weight: 96 kg 97.3 kg 96.4 kg    ROS: Review of Systems  Constitutional: Negative for chills and fever.  Eyes: Negative for blurred vision.  Respiratory: Negative for cough and shortness of breath.   Cardiovascular: Negative for chest pain.  Gastrointestinal: Positive for abdominal pain, diarrhea and nausea. Negative for constipation and vomiting.  Genitourinary: Negative for dysuria.  Musculoskeletal: Negative for joint pain.  Neurological: Negative for dizziness and headaches.   Exam: Physical Exam  Constitutional: He is oriented to person, place, and time.  HENT:  Nose: No mucosal edema.  Mouth/Throat: No oropharyngeal exudate or posterior oropharyngeal edema.  Eyes: Pupils are equal, round, and reactive to light. Conjunctivae, EOM and lids are normal.  Neck: No JVD present. Carotid bruit is not present. No edema present. No thyroid mass and no thyromegaly present.  Cardiovascular: S1 normal and S2 normal. Exam reveals no gallop.  No murmur heard. Pulses:      Dorsalis pedis pulses are 2+ on the right side, and 2+ on the left side.  Respiratory: No respiratory distress. He has no wheezes. He has no rhonchi. He has no rales.  GI: Soft. Bowel sounds are normal. There is tenderness in the left lower quadrant.  Musculoskeletal:       Right ankle: He exhibits swelling.       Left ankle:  He exhibits swelling.  Lymphadenopathy:    He has no cervical adenopathy.  Neurological: He is alert and oriented to person, place, and time. No cranial nerve deficit.  Skin: Skin is warm. No rash noted. Nails show no clubbing.  Psychiatric: He has a normal mood and affect.      Data Reviewed: Basic Metabolic Panel: Recent Labs  Lab 04/12/18 0639 04/12/18 1323 04/13/18 0156 04/15/18 1614 04/16/18 0319  NA 141  --  139  --  138  K 4.5  --  3.8  --  3.8  CL 97*  --  98  --  97*  CO2 27  --  32  --  32  GLUCOSE 114*  --  98  --  108*  BUN 50*  --  23  --  26*  CREATININE 10.26*  --  5.89*  --  6.59*  CALCIUM 8.8*  --  8.4*  --  8.0*  PHOS  --  5.5*  --  5.5*  --    Liver Function Tests: Recent Labs  Lab 04/12/18 0639  AST 24  ALT 17  ALKPHOS 77  BILITOT 1.2  PROT 6.7  ALBUMIN 3.7   CBC: Recent Labs  Lab 04/12/18 0639  04/13/18 0511 04/14/18 0457 04/15/18 1614 04/16/18 0319 04/18/18 1356  WBC 24.3*   < > 21.9* 17.4* 14.6* 12.2* 12.2*  NEUTROABS 12.0*  --   --   --   --   --   --  HGB 11.7*   < > 10.6* 10.7* 10.3* 10.2* 10.0*  HCT 34.3*   < > 31.3* 29.9* 31.0* 29.4* 28.7*  MCV 90.9   < > 91.8 91.1 93.4 93.3 93.7  PLT 135*   < > 116* 112* 130* 113* 161   < > = values in this interval not displayed.     Recent Results (from the past 240 hour(s))  Culture, blood (Routine x 2)     Status: Abnormal   Collection Time: 04/12/18  6:37 AM  Result Value Ref Range Status   Specimen Description   Final    BLOOD RIGHT ARM Performed at [facility], [address], [place], [place] [address]    Special Requests   Final    BOTTLES DRAWN AEROBIC AND ANAEROBIC Blood Culture adequate volume Performed at [facility], [address] [place], [place] [address]    Culture  Setup Time   Final    GRAM POSITIVE RODS ANAEROBIC BOTTLE ONLY CRITICAL RESULT CALLED TO, READ BACK BY AND VERIFIED WITH: DAVID BESANTI AT 2358 04/12/18.PMH Performed at [facility], [address] [place], [place] [address]    Culture CLOSTRIDIUM SPECIES (A)  Final   Report Status 04/16/2018 FINAL  Final  Culture, blood (Routine x 2)     Status: Abnormal   Collection Time: 04/12/18  6:39 AM  Result Value Ref Range Status   Specimen Description   Final    BLOOD RIGHT HAND Performed at [facility], [address] [place], [place] [address]    Special Requests   Final    BOTTLES DRAWN AEROBIC AND ANAEROBIC Blood Culture adequate volume Performed at [facility], [address], [place], [place] [address]    Culture  Setup Time   Final    ANAEROBIC BOTTLE ONLY GRAM POSITIVE RODS CRITICAL VALUE NOTED.  VALUE IS CONSISTENT WITH PREVIOUSLY REPORTED AND CALLED VALUE. Performed at [facility], [address] [place], [place] [address]    Culture CLOSTRIDIUM SEPTICUM (A)  Final   Report Status 04/16/2018 FINAL  Final  Gastrointestinal Panel by PCR , Stool     Status: Abnormal   Collection Time: 04/12/18  7:02 AM  Result Value Ref Range Status   Campylobacter species NOT DETECTED NOT DETECTED Final   Plesimonas shigelloides NOT DETECTED NOT DETECTED Final   Salmonella species NOT DETECTED NOT DETECTED Final   Yersinia enterocolitica NOT DETECTED NOT DETECTED Final   Vibrio species NOT DETECTED NOT DETECTED Final   Vibrio cholerae NOT DETECTED NOT DETECTED Final   Enteroaggregative E coli (EAEC) NOT DETECTED NOT DETECTED Final   Enteropathogenic E coli (EPEC) DETECTED (A) NOT DETECTED Final    Comment: RESULT CALLED TO, READ BACK BY AND VERIFIED WITH: MATTHEW WRIGHT 04/12/18 @ 1751  MLK    Enterotoxigenic E coli (ETEC) NOT DETECTED NOT DETECTED Final   Shiga like toxin producing E coli (STEC) NOT DETECTED NOT DETECTED Final   Shigella/Enteroinvasive E coli (EIEC) NOT DETECTED NOT DETECTED Final   Cryptosporidium NOT DETECTED NOT DETECTED Final   Cyclospora cayetanensis NOT DETECTED NOT DETECTED Final   Entamoeba  histolytica NOT DETECTED NOT DETECTED Final   Giardia lamblia NOT DETECTED NOT DETECTED Final   Adenovirus F40/41 NOT DETECTED NOT DETECTED Final   Astrovirus NOT DETECTED NOT DETECTED Final   Norovirus GI/GII NOT DETECTED NOT DETECTED Final   Rotavirus A NOT DETECTED NOT DETECTED Final   Sapovirus (I, II, IV, and V)  NOT DETECTED NOT DETECTED Final    Comment: Performed at [facility], [address] [place], [place] [address]  C difficile quick scan w PCR reflex     Status: Abnormal   Collection Time: 04/12/18  7:02 AM  Result Value Ref Range Status   C Diff antigen POSITIVE (A) NEGATIVE Final   C Diff toxin NEGATIVE NEGATIVE Final   C Diff interpretation Results are indeterminate. See PCR results.  Final    Comment: Performed at [facility], [address] [place], [place] [address]  C. Diff by PCR, Reflexed     Status: None   Collection Time: 04/12/18  7:02 AM  Result Value Ref Range Status   Toxigenic C. Difficile by PCR NEGATIVE NEGATIVE Final    Comment: Patient is colonized with non toxigenic C. difficile. May not need treatment unless significant symptoms are present. Performed at [facility], [address] [place], [place] [address]   MRSA PCR Screening     Status: None   Collection Time: 04/12/18  7:00 PM  Result Value Ref Range Status   MRSA by PCR NEGATIVE NEGATIVE Final    Comment:        The GeneXpert MRSA Assay (FDA approved for NASAL specimens only), is one component of a comprehensive MRSA colonization surveillance program. It is not intended to diagnose MRSA infection nor to guide or monitor treatment for MRSA infections. Performed at [facility], [address] [place], [place] [address]   Culture, blood (Routine X 2) w Reflex to ID Panel     Status: None (Preliminary result)   Collection Time: 04/17/18 12:53 AM  Result Value Ref Range Status   Specimen Description BLOOD BLOOD RIGHT ARM  Final   Special  Requests   Final    BOTTLES DRAWN AEROBIC AND ANAEROBIC Blood Culture adequate volume   Culture   Final    NO GROWTH 1 DAY Performed at [facility], [address], [place], [place] [address]    Report Status PENDING  Incomplete  Culture, blood (Routine X 2) w Reflex to ID Panel     Status: None (Preliminary result)   Collection Time: 04/17/18 12:59 AM  Result Value Ref Range Status   Specimen Description BLOOD BLOOD RIGHT WRIST  Final   Special Requests   Final    BOTTLES DRAWN AEROBIC AND ANAEROBIC Blood Culture adequate volume   Culture   Final    NO GROWTH 1 DAY Performed at [facility], [address], [place], [place] [address]    Report Status PENDING  Incomplete      Scheduled Meds: . amLODipine  5 mg Oral Daily  . Chlorhexidine Gluconate Cloth  6 each Topical Q0600  . Chlorhexidine Gluconate Cloth  6 each Topical Once  . epoetin (EPOGEN/PROCRIT) injection  4,000 Units Intravenous Q M,W,F-HD  . multivitamin  1 tablet Oral Daily  . sodium chloride flush  3 mL Intravenous Q12H   Continuous Infusions: . sodium chloride Stopped (04/16/18 0700)  . ampicillin-sulbactam (UNASYN) IV 3 g (04/18/18 1318)  . famotidine (PEPCID) IV 20 mg (04/18/18 1509)    Assessment/Plan:  1. Clostridium septicum bacteremia, aortitis.  Case discussed with infectious disease and she recommended at least 4 weeks of IV antibiotics.  TEE to be done to rule out endocarditis tomorrow morning.  Okay to place PICC line today as per ID. 2. Colon mass.  Biopsies to rule out cancerous process.  Clostridium septicum bacteremia can  go along with a GI malignancy.  Sent off CEA.  Case discussed with general surgery and oncology.  CT scan of the chest to complete metastatic work-up.  CAT scan of the abdomen did not show any large metastases but did show aortitis.  Shunt is unclear whether he wants to do anything with this colon mass but he will speak with his family and get back with me.  He  is okay with speaking with the specialist. 3. Abdominal pain, nausea.  Stool culture growing out enteropathogenic E. Coli.  Empiric Pepcid. 4. End-stage renal disease on hemodialysis Monday Wednesday Friday. 5. Regional aortitis.  Inflammation seen on the lower aorta and iliac arteries.  This is likely secondary to infection rather than rheumatological process.  Follow-up as outpatient.  Code Status:     Code Status Orders  (From admission, onward)         Start     Ordered   04/12/18 1311  Do not attempt resuscitation (DNR)  Continuous    Question Answer Comment  In the event of cardiac or respiratory ARREST Do not call a "code blue"   In the event of cardiac or respiratory ARREST Do not perform Intubation, CPR, defibrillation or ACLS   In the event of cardiac or respiratory ARREST Use medication by any route, position, wound care, and other measures to relive pain and suffering. May use oxygen, suction and manual treatment of airway obstruction as needed for comfort.      04/12/18 1310        Code Status History    Date Active Date Inactive Code Status Order ID Comments User Context   04/12/2018 0827 04/12/2018 1310 DNR [id number]  Dustin Flock, MD ED   08/17/2016 0352 08/18/2016 2236 Full Code [id number]  Saundra Shelling, MD Inpatient   01/12/2016 1417 01/12/2016 1754 Full Code [id number]  Algernon Huxley, MD Inpatient      Disposition Plan: To be determined based on what he wants to do with the colon mass  Consultants:  Vascular surgery  Nephrology  Infectious disease  Gastroenterology  General surgery  Oncology  Time spent: 45 minutes, including ACP time.  Spoke with patient's son at the bedside. Case discussed with infectious disease, general surgery, nephrology and oncology  Shantay Sonn [employer]

## 2018-04-18 NOTE — Op Note (Signed)
[facility] Gastroenterology Patient Name: Jonathan Mcgrath Procedure Date: 04/18/2018 9:48 AM MRN: [id number] Account #: [id number] Date of Birth: 1940-11-19 Admit Type: Inpatient Age: 77 Room: [facility] ENDO ROOM 2 Gender: Male Note Status: Finalized Procedure:            Colonoscopy Indications:          Suspected colorectal cancer, Rule out colon malignancy                        due to positive clostridium on blood culture. Providers:            Lennette Bihari. Bonna Gains MD, MD Referring MD:         Forest Gleason Md, MD (Referring MD) Medicines:            Monitored Anesthesia Care Complications:        No immediate complications. Procedure:            Pre-Anesthesia Assessment:                       - ASA Grade Assessment: III - A patient with severe                        systemic disease.                       - Prior to the procedure, a History and Physical was                        performed, and patient medications, allergies and                        sensitivities were reviewed. The patient's tolerance of                        previous anesthesia was reviewed.                       - The risks and benefits of the procedure and the                        sedation options and risks were discussed with the                        patient. All questions were answered and informed                        consent was obtained.                       - Patient identification and proposed procedure were                        verified prior to the procedure by the physician, the                        nurse, the anesthesiologist, the anesthetist and the                        technician. The procedure was verified in the procedure  room.                       After obtaining informed consent, the colonoscope was                        passed under direct vision. Throughout the procedure,                        the patient's blood pressure, pulse, and  oxygen                        saturations were monitored continuously. The                        Colonoscope was introduced through the anus and                        advanced to the the cecum, identified by appendiceal                        orifice and ileocecal valve. The colonoscopy was                        performed with ease. The patient tolerated the                        procedure well. The quality of the bowel preparation                        was good. Findings:      The perianal and digital rectal examinations were normal.      A polypoid non-obstructing large mass was found in the ascending colon       and in the cecum. The mass was partially circumferential (involving       two-thirds of the lumen circumference). No bleeding was present. This       was biopsied with a cold forceps for histology.      Two sessile, non-bleeding polyps were found in the ascending colon. The       polyps were 4 to 7 mm in size. The polyp was removed with a hot snare       and The polyp was removed with a cold snare. Resection and retrieval       were complete. (The 11mm polyp removed with a hot snare was located just       1 fold distal to the cecal mass. When the snare was opened to capture       the polyp, it touched the cecal mass and some tissue from the cecal mass       may have come off to the tip of the snare. As soon as this was noted,       the endoscopy tech in the room (Crystal) was asked to remove the snare       and clean it before the polyp was removed. However, the tech reports       that she may have placed some of this tissue from the snare, which was       supposed to be cleaned, into the Jar with these polyps instead of just       cleaning the snare outside of any jars. Therefore there  is a possibility       of this Jar having some tissue from the colon mass).      A 10 mm polyp was found in the ascending colon. The polyp was sessile.       The polyp was removed with a hot  snare. Resection and retrieval were       complete.      A localized area of mildly thickened folds of the mucosa was found at       the hepatic flexure. Biopsies were taken with a cold forceps for       histology.      Nine sessile, non-bleeding polyps were found in the transverse colon.       The polyps were 4 to 7 mm in size. The polyp was removed with a hot       snare and The polyp was removed with a cold snare. Resection and       retrieval were complete.      A 7 mm polyp was found in the sigmoid colon. The polyp was sessile. The       polyp was removed with a hot snare. Resection and retrieval were       complete.      The exam was otherwise without abnormality.      The retroflexed view of the distal rectum and anal verge was normal and       showed no anal or rectal abnormalities. Impression:           - Rule out malignancy, tumor in the ascending colon and                        in the cecum. Biopsied. (Sample sent as STAT to                        pathology)                       - Two 4 to 7 mm, non-bleeding polyps in the ascending                        colon, removed with a cold snare and removed with a hot                        snare. Resected and retrieved. (See above description                        for details of the contents of this jar)                       - One 10 mm polyp in the ascending colon, removed with                        a hot snare. Resected and retrieved.                       - Thickened folds of the mucosa at the hepatic flexure.                        Biopsied.                       -  Nine 4 to 7 mm, non-bleeding polyps in the transverse                        colon, removed with a cold snare and removed with a hot                        snare. Resected and retrieved.                       - One 7 mm polyp in the sigmoid colon, removed with a                        hot snare. Resected and retrieved.                       - The examination was  otherwise normal.                       - The distal rectum and anal verge are normal on                        retroflexion view. Recommendation:       - Await pathology results.                       - Refer to a surgeon today.                       - Refer to an oncologist.                       - Advance diet as tolerated.                       - Return patient to hospital ward for ongoing care.                       - Continue present medications.                       - Repeat colonoscopy in 1 year for surveillance.                       - The findings and recommendations were discussed with                        the patient.                       - The findings and recommendations were discussed with                        the patient's family.                       - Return to primary care physician as previously                        scheduled. Procedure Code(s):    --- Professional ---                       [phone], Colonoscopy, flexible; with removal of  tumor(s),                        polyp(s), or other lesion(s) by snare technique                       [id number], 59, Colonoscopy, flexible; with biopsy, single                        or multiple Diagnosis Code(s):    --- Professional ---                       D49.0, Neoplasm of unspecified behavior of digestive                        system                       D12.2, Benign neoplasm of ascending colon                       D12.3, Benign neoplasm of transverse colon (hepatic                        flexure or splenic flexure)                       D12.5, Benign neoplasm of sigmoid colon                       K63.89, Other specified diseases of intestine CPT copyright 2017 American Medical Association. All rights reserved. The codes documented in this report are preliminary and upon coder review may  be revised to meet current compliance requirements.  Vonda Antigua, MD Margretta Sidle B. Bonna Gains MD, MD 04/18/2018 11:47:51  AM This report has been signed electronically. Number of Addenda: 0 Note Initiated On: 04/18/2018 9:48 AM Scope Withdrawal Time: 0 hours 56 minutes 33 seconds  Total Procedure Duration: 1 hour 6 minutes 19 seconds  Estimated Blood Loss: Estimated blood loss: none.      [facility]

## 2018-04-18 NOTE — Progress Notes (Signed)
Patient ID: Jonathan Mcgrath, male   DOB: 23-Aug-1940, 77 y.o.   MRN: [id number]  PICC line team is unable to place a PICC line on a dialysis patient.  Spoke with interventional radiology and they also deferred to the vascular team.  Put a message in for Dr. Lucky Cowboy about an access for this patient for IV antibiotics at home.  Dr. Loletha Grayer

## 2018-04-18 NOTE — Anesthesia Post-op Follow-up Note (Signed)
Anesthesia QCDR form completed.        

## 2018-04-18 NOTE — Treatment Plan (Signed)
Diagnosis: Clostridium septicum bacteremia and aortitis Baseline Creatinine ESRD on dilaysis   No Known Allergies  OPAT Orders Discharge antibiotics: Unasyn 3 grams IV every 12 hours until 05/12/18  Care of central catheter  Labs weekly while on IV antibiotics: _X_ CBC with differential  _X_ CMP   _central line will have to be removed by IR/VAscular at the end of treatment  Fax weekly labs to [phone]  Clinic Follow Up Appt in 3 weeks Call [phone] to schedule appt   @

## 2018-04-18 NOTE — Progress Notes (Signed)
Spoke with primary RN in regards to PICC placement , due to patient being a renal and having HD a PICC is not an appropriate line . Referred to IR for a tunneled catheter on chest to preserve the veins in the arm.

## 2018-04-18 NOTE — Progress Notes (Signed)
Patient ID: Jonathan Mcgrath, male   DOB: Apr 14, 1941, 77 y.o.   MRN: [id number]  ACP note  Patient and son at the bedside  Diagnosis: Clostridium septicum bacteremia and aortitis, colon mass concerning for colon cancer, abdominal pain and nausea, end-stage renal disease.  Plan.  Patient will talk with family about what to do with this colon mass.  He is okay with treating the infection with antibiotics.  He is okay with speaking with the general surgeon and oncologist.  Depending on what he wants to do will depend on when I can discharge him home.  He will need home health for IV antibiotics and PICC line.  CT scan of the chest for completion of metastatic work-up.  Patient is already a DNR  Time spent on ACP discussion 25 minutes Dr. Loletha Grayer

## 2018-04-19 ENCOUNTER — Encounter: Payer: Self-pay | Admitting: Anesthesiology

## 2018-04-19 ENCOUNTER — Inpatient Hospital Stay
Admit: 2018-04-19 | Discharge: 2018-04-19 | Disposition: A | Discharge status: Home / Self Care | Payer: Medicare HMO | Attending: Cardiology | Admitting: Cardiology

## 2018-04-19 ENCOUNTER — Encounter: Payer: Self-pay | Admitting: *Deleted

## 2018-04-19 ENCOUNTER — Encounter
Admission: EM | Disposition: A | Discharge status: Home Health | Payer: Self-pay | Source: Home / Self Care | Attending: Internal Medicine

## 2018-04-19 DIAGNOSIS — Z992 Dependence on renal dialysis: Secondary | ICD-10-CM

## 2018-04-19 DIAGNOSIS — B967 Clostridium perfringens [C. perfringens] as the cause of diseases classified elsewhere: Secondary | ICD-10-CM

## 2018-04-19 DIAGNOSIS — A4189 Other specified sepsis: Secondary | ICD-10-CM

## 2018-04-19 DIAGNOSIS — N186 End stage renal disease: Secondary | ICD-10-CM

## 2018-04-19 HISTORY — PX: CENTRAL LINE INSERTION-TUNNELED: CATH118291

## 2018-04-19 HISTORY — PX: TEE WITHOUT CARDIOVERSION: SHX5443

## 2018-04-19 LAB — CEA: CEA: 4.7 ng/mL (ref 0.0–4.7)

## 2018-04-19 LAB — FERRITIN: Ferritin: 845 ng/mL — ABNORMAL HIGH (ref 24–336)

## 2018-04-19 LAB — FOLATE: Folate: 19.5 ng/mL (ref >5.9)

## 2018-04-19 LAB — IRON AND TIBC
Iron: 29 ug/dL — ABNORMAL LOW (ref 45–182)
Saturation Ratios: 26 % (ref 17.9–39.5)
TIBC: 112 ug/dL — ABNORMAL LOW (ref 250–450)
UIBC: 83 ug/dL

## 2018-04-19 LAB — PHOSPHORUS: Phosphorus: 5.4 mg/dL — ABNORMAL HIGH (ref 2.5–4.6)

## 2018-04-19 LAB — VITAMIN B12: Vitamin B-12: 944 pg/mL — ABNORMAL HIGH (ref 180–914)

## 2018-04-19 SURGERY — ECHOCARDIOGRAM, TRANSESOPHAGEAL
Anesthesia: Moderate Sedation

## 2018-04-19 SURGERY — CENTRAL LINE INSERTION-TUNNELED
Anesthesia: Moderate Sedation

## 2018-04-19 MED ORDER — LIDOCAINE-EPINEPHRINE (PF) 1 %-1:200000 IJ SOLN
INTRAMUSCULAR | Status: AC
  Filled 2018-04-19: qty 30

## 2018-04-19 MED ORDER — CEFAZOLIN SODIUM-DEXTROSE 1-4 GM/50ML-% IV SOLN
1.0000 g | Freq: Once | INTRAVENOUS | Status: AC
  Administered 2018-04-19: 1 g via INTRAVENOUS

## 2018-04-19 MED ORDER — LIDOCAINE VISCOUS HCL 2 % MT SOLN
OROMUCOSAL | Status: AC
  Administered 2018-04-19: 15 mL
  Filled 2018-04-19: qty 15

## 2018-04-19 MED ORDER — FENTANYL CITRATE (PF) 100 MCG/2ML IJ SOLN
INTRAMUSCULAR | Status: AC | PRN
  Administered 2018-04-19: 50 ug via INTRAVENOUS
  Administered 2018-04-19: 25 ug via INTRAVENOUS

## 2018-04-19 MED ORDER — MIDAZOLAM HCL 5 MG/5ML IJ SOLN
INTRAMUSCULAR | Status: AC
  Filled 2018-04-19: qty 5

## 2018-04-19 MED ORDER — MIDAZOLAM HCL 2 MG/2ML IJ SOLN
INTRAMUSCULAR | Status: AC | PRN
  Administered 2018-04-19: 2 mg via INTRAVENOUS
  Administered 2018-04-19 (×2): 1 mg via INTRAVENOUS

## 2018-04-19 MED ORDER — SODIUM CHLORIDE FLUSH 0.9 % IV SOLN
INTRAVENOUS | Status: AC
  Filled 2018-04-19: qty 10

## 2018-04-19 MED ORDER — BUTAMBEN-TETRACAINE-BENZOCAINE 2-2-14 % EX AERO
INHALATION_SPRAY | CUTANEOUS | Status: AC
  Administered 2018-04-19: 08:00:00
  Filled 2018-04-19: qty 5

## 2018-04-19 MED ORDER — LABETALOL HCL 5 MG/ML IV SOLN
INTRAVENOUS | Status: DC | PRN
  Administered 2018-04-19 (×2): 10 mg via INTRAVENOUS

## 2018-04-19 MED ORDER — MIDAZOLAM HCL 2 MG/2ML IJ SOLN
INTRAMUSCULAR | Status: DC | PRN
  Administered 2018-04-19: 3 mg via INTRAVENOUS

## 2018-04-19 MED ORDER — HEPARIN (PORCINE) IN NACL 1000-0.9 UT/500ML-% IV SOLN
INTRAVENOUS | Status: AC
  Filled 2018-04-19: qty 500

## 2018-04-19 MED ORDER — LABETALOL HCL 5 MG/ML IV SOLN
INTRAVENOUS | Status: AC
  Filled 2018-04-19: qty 4

## 2018-04-19 MED ORDER — FENTANYL CITRATE (PF) 100 MCG/2ML IJ SOLN
INTRAMUSCULAR | Status: AC
  Filled 2018-04-19: qty 2

## 2018-04-19 SURGICAL SUPPLY — 9 items
DERMABOND ADVANCED (GAUZE/BANDAGES/DRESSINGS) ×1
DERMABOND ADVANCED .7 DNX12 (GAUZE/BANDAGES/DRESSINGS) ×1 IMPLANT
DRAPE INCISE IOBAN 66X45 STRL (DRAPES) ×2 IMPLANT
KIT SINGLE LUMEN POWERLINE 5FR (CATHETERS) ×2 IMPLANT
PACK ANGIOGRAPHY (CUSTOM PROCEDURE TRAY) ×2 IMPLANT
SUT MNCRL 4-0 (SUTURE) ×1
SUT MNCRL 4-0 27XMFL (SUTURE) ×1
SUT SILK 0 FSL (SUTURE) ×2 IMPLANT
SUTURE MNCRL 4-0 27XMF (SUTURE) ×1 IMPLANT

## 2018-04-19 NOTE — Progress Notes (Signed)
Jonathan Antigua, MD [address] [place], [place], [place], [address] [address], [place], [place], [place], [address] Phone: [phone]  Fax: [phone]   Subjective: Patient denies any abdominal pain.  No nausea or vomiting.  No melena or hematochezia.   Objective: Exam: Vital signs in last 24 hours: Vitals:   04/19/18 1415 04/19/18 1430 04/19/18 1445 04/19/18 1500  BP: (!) 164/73 (!) 177/82 (!) 176/72 (!) 170/79  Pulse: 78 71 76 81  Resp: 19 16 18 15   Temp:      TempSrc:      SpO2: 98% 96% 98% 99%  Weight:      Height:       Weight change: -2.181 kg  Intake/Output Summary (Last 24 hours) at 04/19/2018 1515 Last data filed at 04/19/2018 0400 Gross per 24 hour  Intake 390.03 ml  Output -  Net 390.03 ml    General: No acute distress, AAO x3 Abd: Soft, NT/ND, No HSM Skin: Warm, no rashes Neck: Supple, Trachea midline   Lab Results: Lab Results  Component Value Date   WBC 12.2 (H) 04/18/2018   HGB 10.0 (L) 04/18/2018   HCT 28.7 (L) 04/18/2018   MCV 93.7 04/18/2018   PLT 161 04/18/2018   Micro Results: Recent Results (from the past 240 hour(s))  Culture, blood (Routine x 2)     Status: Abnormal   Collection Time: 04/12/18  6:37 AM  Result Value Ref Range Status   Specimen Description   Final    BLOOD RIGHT ARM Performed at [facility], [address], [place], [place] [address]    Special Requests   Final    BOTTLES DRAWN AEROBIC AND ANAEROBIC Blood Culture adequate volume Performed at [facility], [address] [place], [place] [address]    Culture  Setup Time   Final    GRAM POSITIVE RODS ANAEROBIC BOTTLE ONLY CRITICAL RESULT CALLED TO, READ BACK BY AND VERIFIED WITH: DAVID BESANTI AT 2358 04/12/18.PMH Performed at [facility], [address] [place], [place] [address]    Culture CLOSTRIDIUM SPECIES (A)  Final   Report Status 04/16/2018 FINAL  Final  Culture, blood (Routine x 2)     Status: Abnormal   Collection Time: 04/12/18  6:39 AM  Result Value Ref Range Status   Specimen Description   Final    BLOOD RIGHT HAND Performed at [facility], [address], [place], [place] [address]    Special Requests   Final    BOTTLES DRAWN AEROBIC AND ANAEROBIC Blood Culture adequate volume Performed at [facility], [address], [place], [place] [address]    Culture  Setup Time   Final    ANAEROBIC BOTTLE ONLY GRAM POSITIVE RODS CRITICAL VALUE NOTED.  VALUE IS CONSISTENT WITH PREVIOUSLY REPORTED AND CALLED VALUE. Performed at [facility], [address] [place], [place] [address]    Culture CLOSTRIDIUM SEPTICUM (A)  Final   Report Status 04/16/2018 FINAL  Final  Gastrointestinal Panel by PCR , Stool     Status: Abnormal   Collection Time: 04/12/18  7:02 AM  Result Value Ref Range Status   Campylobacter species NOT DETECTED NOT DETECTED Final   Plesimonas shigelloides NOT DETECTED NOT DETECTED Final   Salmonella species NOT DETECTED NOT DETECTED Final   Yersinia enterocolitica NOT DETECTED NOT DETECTED Final   Vibrio species NOT DETECTED NOT DETECTED Final   Vibrio cholerae NOT DETECTED NOT DETECTED Final   Enteroaggregative E  coli (EAEC) NOT DETECTED NOT DETECTED Final   Enteropathogenic E coli (EPEC) DETECTED (A) NOT DETECTED Final    Comment: RESULT CALLED TO, READ BACK BY AND VERIFIED WITH: MATTHEW WRIGHT 04/12/18 @ 1751  [place] E coli (ETEC) NOT DETECTED NOT DETECTED Final   Shiga like toxin producing E coli (STEC) NOT DETECTED NOT DETECTED Final   Shigella/Enteroinvasive E coli (EIEC) NOT DETECTED NOT DETECTED Final   Cryptosporidium NOT DETECTED NOT DETECTED Final   Cyclospora cayetanensis NOT DETECTED NOT DETECTED Final   Entamoeba histolytica NOT DETECTED NOT DETECTED Final   Giardia lamblia NOT DETECTED NOT DETECTED Final   Adenovirus F40/41 NOT DETECTED NOT DETECTED Final   Astrovirus NOT DETECTED NOT DETECTED Final    Norovirus GI/GII NOT DETECTED NOT DETECTED Final   Rotavirus A NOT DETECTED NOT DETECTED Final   Sapovirus (I, II, IV, and V) NOT DETECTED NOT DETECTED Final    Comment: Performed at [facility], [address] [place], [place] [address]  C difficile quick scan w PCR reflex     Status: Abnormal   Collection Time: 04/12/18  7:02 AM  Result Value Ref Range Status   C Diff antigen POSITIVE (A) NEGATIVE Final   C Diff toxin NEGATIVE NEGATIVE Final   C Diff interpretation Results are indeterminate. See PCR results.  Final    Comment: Performed at [facility], [address] [place], [place] [address]  C. Diff by PCR, Reflexed     Status: None   Collection Time: 04/12/18  7:02 AM  Result Value Ref Range Status   Toxigenic C. Difficile by PCR NEGATIVE NEGATIVE Final    Comment: Patient is colonized with non toxigenic C. difficile. May not need treatment unless significant symptoms are present. Performed at [facility], [address] [place], [place] [address]   MRSA PCR Screening     Status: None   Collection Time: 04/12/18  7:00 PM  Result Value Ref Range Status   MRSA by PCR NEGATIVE NEGATIVE Final    Comment:        The GeneXpert MRSA Assay (FDA approved for NASAL specimens only), is one component of a comprehensive MRSA colonization surveillance program. It is not intended to diagnose MRSA infection nor to guide or monitor treatment for MRSA infections. Performed at [facility], [address] [place], [place] [address]   Culture, blood (Routine X 2) w Reflex to ID Panel     Status: None (Preliminary result)   Collection Time: 04/17/18 12:53 AM  Result Value Ref Range Status   Specimen Description BLOOD BLOOD RIGHT ARM  Final   Special Requests   Final    BOTTLES DRAWN AEROBIC AND ANAEROBIC Blood Culture adequate volume   Culture   Final    NO GROWTH 2 DAYS Performed at [facility], [address], [place], [place]  [address]    Report Status PENDING  Incomplete  Culture, blood (Routine X 2) w Reflex to ID Panel     Status: None (Preliminary result)   Collection Time: 04/17/18 12:59 AM  Result Value Ref Range Status   Specimen Description BLOOD BLOOD RIGHT WRIST  Final   Special Requests   Final    BOTTLES DRAWN AEROBIC AND ANAEROBIC Blood Culture adequate volume   Culture   Final    NO GROWTH 2 DAYS Performed at [facility], [address] [place], [place] [address]    Report Status PENDING  Incomplete   Studies/Results: Ct Chest W Contrast  Result Date: 04/18/2018 CLINICAL DATA:  Recent diagnosis of colon cancer.  Staging exam. EXAM: CT CHEST WITH CONTRAST TECHNIQUE: Multidetector CT imaging of the chest was performed during intravenous contrast administration. CONTRAST:  75 mL OMNIPAQUE IOHEXOL 300 MG/ML  SOLN COMPARISON:  PA and lateral chest 04/12/2018 and 08/16/2016. FINDINGS: Cardiovascular: Heart size is normal. No pericardial effusion. Scattered calcific aortic and coronary atherosclerotic calcifications are identified. Calcification of the aortic valve is also noted. Mediastinum/Nodes: No enlarged mediastinal, hilar, or axillary lymph nodes. Thyroid gland, trachea, and esophagus demonstrate no significant findings. Small to moderate hiatal hernia is identified. Lungs/Pleura: No pulmonary nodule, mass or consolidative process is identified. Minimal dependent atelectasis is noted. Trace left pleural effusion is seen. Upper Abdomen: Contrast material in the gallbladder consistent with vicarious excretion is noted. No acute abnormality is identified that cyst in the upper pole of the left kidney is unchanged. No acute abnormality is identified. Musculoskeletal: No lytic or sclerotic lesion is identified. Bilateral gynecomastia is seen. IMPRESSION: Negative for metastatic disease. Very small left pleural effusion. Small to moderate hiatal hernia. Calcific coronary artery disease. Gynecomastia.  Aortic Atherosclerosis (ICD10-I70.0). Electronically Signed   By: Inge Rise M.D.   On: 04/18/2018 14:44   [place] Ekg Site Rite  Result Date: 04/18/2018 If Site Rite image not attached, placement could not be confirmed due to current cardiac rhythm.  Medications:  Scheduled Meds: . amLODipine  5 mg Oral Daily  . Chlorhexidine Gluconate Cloth  6 each Topical Q0600  . Chlorhexidine Gluconate Cloth  6 each Topical Once  . fentaNYL      . midazolam      . multivitamin  1 tablet Oral Daily  . sodium chloride flush  3 mL Intravenous Q12H  . sodium chloride flush       Continuous Infusions: . sodium chloride 10 mL/hr at 04/19/18 0753  . ampicillin-sulbactam (UNASYN) IV 3 g (04/19/18 0140)  . famotidine (PEPCID) IV Stopped (04/18/18 1539)   PRN Meds:.sodium chloride, acetaminophen **OR** acetaminophen, bisacodyl, calcium carbonate, hydrALAZINE, HYDROcodone-acetaminophen, ondansetron **OR** ondansetron (ZOFRAN) IV, sodium chloride flush   Assessment: Active Problems:   Abdominal pain   Benign neoplasm of ascending colon   Benign neoplasm of transverse colon   Polyp of sigmoid colon   Colon neoplasm   Mass of cecum   Diarrhea   Stomach irritation    Plan: As per Dr. 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 Dr. Earleen Newport informed of above results Patient informed of above results Follow-up with oncology and surgery as an inpatient Follow-up in GI clinic Follow-up with PCP on discharge  GI service will sign off, please page with any questions   LOS: 7 days   Jonathan Antigua, MD 04/19/2018, 3:15 PM

## 2018-04-19 NOTE — Progress Notes (Signed)
[facility], [place] 04/19/18  Subjective:  Patient resting in bed comfortably. Due for hemodialysis today. Found to have colonic mass.   Objective:  Vital signs in last 24 hours:  Temp:  [98 F (36.7 C)-99.5 F (37.5 C)] 98.1 F (36.7 C) (10/04 0921) Pulse Rate:  [77-97] 80 (10/04 0921) Resp:  [13-22] 18 (10/04 0905) BP: (138-191)/(56-80) 147/65 (10/04 0921) SpO2:  [91 %-98 %] 98 % (10/04 1158) Weight:  [95.1 kg] 95.1 kg (10/04 0743)  Weight change: -2.181 kg Filed Weights   04/17/18 2130 04/19/18 0306 04/19/18 0743  Weight: 96.4 kg 95.1 kg 95.1 kg    Intake/Output:    Intake/Output Summary (Last 24 hours) at 04/19/2018 1213 Last data filed at 04/19/2018 0400 Gross per 24 hour  Intake 390.03 ml  Output -  Net 390.03 ml     Physical Exam: General:  No acute distress  HEENT  anicteric, moist oral mucous membranes  Neck  supple  Pulm/lungs  normal breathing effort, room air, CTAB  CVS/Heart  regular  Abdomen:   Nontender, BS present  Extremities:  trace peripheral edema  Neurologic:  Alert, oriented, follows commands  Skin:  No acute rashes  Access:  Left forearm AV fistula       Basic Metabolic Panel:  Recent Labs  Lab 04/12/18 1323 04/13/18 0156 04/15/18 1614 04/16/18 0319  NA  --  139  --  138  K  --  3.8  --  3.8  CL  --  98  --  97*  CO2  --  32  --  32  GLUCOSE  --  98  --  108*  BUN  --  23  --  26*  CREATININE  --  5.89*  --  6.59*  CALCIUM  --  8.4*  --  8.0*  PHOS 5.5*  --  5.5*  --      CBC: Recent Labs  Lab 04/13/18 0511 04/14/18 0457 04/15/18 1614 04/16/18 0319 04/18/18 1356  WBC 21.9* 17.4* 14.6* 12.2* 12.2*  HGB 10.6* 10.7* 10.3* 10.2* 10.0*  HCT 31.3* 29.9* 31.0* 29.4* 28.7*  MCV 91.8 91.1 93.4 93.3 93.7  PLT 116* 112* 130* 113* 161     No results found for: HEPBSAG, HEPBSAB, HEPBIGM    Microbiology:  Recent Results (from the past 240 hour(s))  Culture, blood (Routine x 2)      Status: Abnormal   Collection Time: 04/12/18  6:37 AM  Result Value Ref Range Status   Specimen Description   Final    BLOOD RIGHT ARM Performed at [facility], [address], [place], [place] [address]    Special Requests   Final    BOTTLES DRAWN AEROBIC AND ANAEROBIC Blood Culture adequate volume Performed at [facility], [address] [place], [place] [address]    Culture  Setup Time   Final    GRAM POSITIVE RODS ANAEROBIC BOTTLE ONLY CRITICAL RESULT CALLED TO, READ BACK BY AND VERIFIED WITH: DAVID BESANTI AT 2358 04/12/18.PMH Performed at [facility], [address] [place],  [address]    Culture CLOSTRIDIUM SPECIES (A)  Final   Report Status 04/16/2018 FINAL  Final  Culture, blood (Routine x 2)     Status: Abnormal   Collection Time: 04/12/18  6:39 AM  Result Value Ref Range Status   Specimen Description   Final    BLOOD RIGHT HAND Performed at [facility], [place], [place]  [address]    Special Requests   Final    BOTTLES DRAWN AEROBIC AND ANAEROBIC Blood Culture adequate volume Performed at [facility], [address] [place], [place] [address]    Culture  Setup Time   Final    ANAEROBIC BOTTLE ONLY GRAM POSITIVE RODS CRITICAL VALUE NOTED.  VALUE IS CONSISTENT WITH PREVIOUSLY REPORTED AND CALLED VALUE. Performed at [facility], [address] [place], [place] [address]    Culture CLOSTRIDIUM SEPTICUM (A)  Final   Report Status 04/16/2018 FINAL  Final  Gastrointestinal Panel by PCR , Stool     Status: Abnormal   Collection Time: 04/12/18  7:02 AM  Result Value Ref Range Status   Campylobacter species NOT DETECTED NOT DETECTED Final   Plesimonas shigelloides NOT DETECTED NOT DETECTED Final   Salmonella species NOT DETECTED NOT DETECTED Final   Yersinia enterocolitica NOT DETECTED NOT DETECTED Final   Vibrio species NOT DETECTED NOT DETECTED Final   Vibrio cholerae NOT DETECTED  NOT DETECTED Final   Enteroaggregative E coli (EAEC) NOT DETECTED NOT DETECTED Final   Enteropathogenic E coli (EPEC) DETECTED (A) NOT DETECTED Final    Comment: RESULT CALLED TO, READ BACK BY AND VERIFIED WITH: MATTHEW WRIGHT 04/12/18 @ 1751  MLK    Enterotoxigenic E coli (ETEC) NOT DETECTED NOT DETECTED Final   Shiga like toxin producing E coli (STEC) NOT DETECTED NOT DETECTED Final   Shigella/Enteroinvasive E coli (EIEC) NOT DETECTED NOT DETECTED Final   Cryptosporidium NOT DETECTED NOT DETECTED Final   Cyclospora cayetanensis NOT DETECTED NOT DETECTED Final   Entamoeba histolytica NOT DETECTED NOT DETECTED Final   Giardia lamblia NOT DETECTED NOT DETECTED Final   Adenovirus F40/41 NOT DETECTED NOT DETECTED Final   Astrovirus NOT DETECTED NOT DETECTED Final   Norovirus GI/GII NOT DETECTED NOT DETECTED Final   Rotavirus A NOT DETECTED NOT DETECTED Final   Sapovirus (I, II, IV, and V) NOT DETECTED NOT DETECTED Final    Comment: Performed at [facility], [address] [place], [place] [address]  C difficile quick scan w PCR reflex     Status: Abnormal   Collection Time: 04/12/18  7:02 AM  Result Value Ref Range Status   C Diff antigen POSITIVE (A) NEGATIVE Final   C Diff toxin NEGATIVE NEGATIVE Final   C Diff interpretation Results are indeterminate. See PCR results.  Final    Comment: Performed at [facility], [address] [place], [place] [address]  C. Diff by PCR, Reflexed     Status: None   Collection Time: 04/12/18  7:02 AM  Result Value Ref Range Status   Toxigenic C. Difficile by PCR NEGATIVE NEGATIVE Final    Comment: Patient is colonized with non toxigenic C. difficile. May not need treatment unless significant symptoms are present. Performed at [facility], [address] [place], [place] [address]   MRSA PCR Screening     Status: None   Collection Time: 04/12/18  7:00 PM  Result Value Ref Range Status   MRSA by PCR NEGATIVE NEGATIVE  Final    Comment:        The GeneXpert MRSA Assay (FDA approved for NASAL specimens only), is one component of a comprehensive MRSA colonization surveillance program. It is not intended to diagnose MRSA infection nor to guide or monitor treatment for MRSA infections. Performed at [facility], [address] [place],  [address]   Culture, blood (Routine X 2) w Reflex  to ID Panel     Status: None (Preliminary result)   Collection Time: 04/17/18 12:53 AM  Result Value Ref Range Status   Specimen Description BLOOD BLOOD RIGHT ARM  Final   Special Requests   Final    BOTTLES DRAWN AEROBIC AND ANAEROBIC Blood Culture adequate volume   Culture   Final    NO GROWTH 2 DAYS Performed at [facility], [address] [place], [place] [address]    Report Status PENDING  Incomplete  Culture, blood (Routine X 2) w Reflex to ID Panel     Status: None (Preliminary result)   Collection Time: 04/17/18 12:59 AM  Result Value Ref Range Status   Specimen Description BLOOD BLOOD RIGHT WRIST  Final   Special Requests   Final    BOTTLES DRAWN AEROBIC AND ANAEROBIC Blood Culture adequate volume   Culture   Final    NO GROWTH 2 DAYS Performed at [facility], [address], [place], [place] [address]    Report Status PENDING  Incomplete    Coagulation Studies: No results for input(s): LABPROT, INR in the last 72 hours.  Urinalysis: No results for input(s): COLORURINE, LABSPEC, PHURINE, GLUCOSEU, HGBUR, BILIRUBINUR, KETONESUR, PROTEINUR, UROBILINOGEN, NITRITE, LEUKOCYTESUR in the last 72 hours.  Invalid input(s): APPERANCEUR    Imaging: Ct Chest W Contrast  Result Date: 04/18/2018 CLINICAL DATA:  Recent diagnosis of colon cancer.  Staging exam. EXAM: CT CHEST WITH CONTRAST TECHNIQUE: Multidetector CT imaging of the chest was performed during intravenous contrast administration. CONTRAST:  75 mL OMNIPAQUE IOHEXOL 300 MG/ML  SOLN COMPARISON:  PA and lateral  chest 04/12/2018 and 08/16/2016. FINDINGS: Cardiovascular: Heart size is normal. No pericardial effusion. Scattered calcific aortic and coronary atherosclerotic calcifications are identified. Calcification of the aortic valve is also noted. Mediastinum/Nodes: No enlarged mediastinal, hilar, or axillary lymph nodes. Thyroid gland, trachea, and esophagus demonstrate no significant findings. Small to moderate hiatal hernia is identified. Lungs/Pleura: No pulmonary nodule, mass or consolidative process is identified. Minimal dependent atelectasis is noted. Trace left pleural effusion is seen. Upper Abdomen: Contrast material in the gallbladder consistent with vicarious excretion is noted. No acute abnormality is identified that cyst in the upper pole of the left kidney is unchanged. No acute abnormality is identified. Musculoskeletal: No lytic or sclerotic lesion is identified. Bilateral gynecomastia is seen. IMPRESSION: Negative for metastatic disease. Very small left pleural effusion. Small to moderate hiatal hernia. Calcific coronary artery disease. Gynecomastia. Aortic Atherosclerosis (ICD10-I70.0). Electronically Signed   By: Inge Rise M.D.   On: 04/18/2018 14:44   [place] Ekg Site Rite  Result Date: 04/18/2018 If Site Rite image not attached, placement could not be confirmed due to current cardiac rhythm.    Medications:   . sodium chloride 10 mL/hr at 04/19/18 0753  . ampicillin-sulbactam (UNASYN) IV 3 g (04/19/18 0140)  . famotidine (PEPCID) IV Stopped (04/18/18 1539)   . amLODipine  5 mg Oral Daily  . Chlorhexidine Gluconate Cloth  6 each Topical Q0600  . Chlorhexidine Gluconate Cloth  6 each Topical Once  . epoetin (EPOGEN/PROCRIT) injection  4,000 Units Intravenous Q M,W,F-HD  . fentaNYL      . midazolam      . multivitamin  1 tablet Oral Daily  . sodium chloride flush  3 mL Intravenous Q12H  . sodium chloride flush       sodium chloride, acetaminophen **OR** acetaminophen,  bisacodyl, calcium carbonate, hydrALAZINE, HYDROcodone-acetaminophen, ondansetron **OR** ondansetron (ZOFRAN) IV, sodium chloride flush  Assessment/  Plan:  77 y.o. male  ESRD, hypertension, diabetes mellitus, GERD, anemia of CKD, SHPTH, transitioned off PD to HD 6/16, colonic mass 10/19  [facility]/MWF  1.  End-stage renal disease -Patient is due for hemodialysis today.  Orders have been prepared.  Thereafter next dialysis treatment will be on Monday.  2.  Anemia of CKD -Discontinue Epogen at this time given new colonic mass.  3.  Secondary hyperparathyroidism -We plan to check serum phosphorus today.  4. Acute Gastroenteritis -C. difficile antigen positive but toxin negative. Stool positive for enteropathogenic E. coli Treated with Cipro    LOS: 7 Jonathan Mcgrath 10/4/201912:13 PM  [place], [place]  Note: This note was prepared with Dragon dictation. Any transcription errors are unintentional

## 2018-04-19 NOTE — Progress Notes (Signed)
Chaplain responded to a page form nurse. Pt brother was concerned about Pt being depressed. Chaplain presented with brother at beside. Pt was alert and smiling. Pt seem to wanted to be viewed as "think everything is ok" . Chaplin initiated humor to see the Pt reaction. Pt reacted normally to humor. Chaplain will follow up.    04/19/18 1100  Clinical Encounter Type  Visited With Patient and family together  Visit Type Initial  Referral From Family  Spiritual Encounters  Spiritual Needs Emotional

## 2018-04-19 NOTE — Procedures (Signed)
   TRANSESOPHAGEAL ECHOCARDIOGRAM   NAME:  Jonathan Mcgrath   MRN: [id number] DOB:  Oct 19, 1940   ADMIT DATE: 04/12/2018  INDICATIONS:   PROCEDURE:   Informed consent was obtained prior to the procedure. The risks, benefits and alternatives for the procedure were discussed and the patient comprehended these risks.  Risks include, but are not limited to, cough, sore throat, vomiting, nausea, somnolence, esophageal and stomach trauma or perforation, bleeding, low blood pressure, aspiration, pneumonia, infection, trauma to the teeth and death.    After a procedural time-out, the patient was given 4 mg versed and 75 mcg fentanyl to achieve moderate sedation for 25 min.  The oropharynx was anesthetized 3 cc of topical 1% viscous lidocaine.  The transesophageal probe was inserted in the esophagus and stomach without difficulty and multiple views were obtained. I was present for the entire procedure.    COMPLICATIONS:    There were no immediate complications.  FINDINGS:  LEFT VENTRICLE: EF = 60%. No regional wall motion abnormalities.  RIGHT VENTRICLE: Normal size and function.   LEFT ATRIUM: Left atrium borderline enlarged.  Left atrial appendage intact.  No thrombus.  LEFT ATRIAL APPENDAGE: No thrombus.  Good Doppler flow.  RIGHT ATRIUM: Borderline enlarged.  No thrombus.  AORTIC VALVE:  Trileaflet.  No vegetations.  Trivial AI.  MITRAL VALVE:    Normal.  Mild to moderate MR.  No vegetations.  TRICUSPID VALVE: Normal.  Mild TR.  No vegetations  PULMONIC VALVE: Grossly normal.  No vegetations noted.  INTERATRIAL SEPTUM: No PFO or ASD. Agitated saline contrast was used.   PERICARDIUM: No effusion  DESCENDING AORTA: No evidence of significant calcification or plaque.  CONCLUSION: No evidence of vegetations on TEE.  This does not rule out endocarditis.  Mild to moderate MR trivial AI and mild TR.  No ASD or PFO noted by contrast saline.

## 2018-04-19 NOTE — Care Management Important Message (Signed)
Important Message  Patient Details  Name: Jonathan Mcgrath MRN: [id number] Date of Birth: [birth year]   Medicare Important Message Given:  Yes    Juliann Pulse A Josiah Nieto 04/19/2018, 11:42 AM

## 2018-04-19 NOTE — Progress Notes (Signed)
Patient was taken to dialysis on 2L O2.

## 2018-04-19 NOTE — Progress Notes (Signed)
Post HD Treatment  Patient's treatment terminated early. All blood was rinsed back to patient. His net UF was 510 and his BVP was 54.1. Hospitalist at the bedside discussing plan of care with patient.    04/19/18 1600  Vital Signs  Temp 98.6 F (37 C)  Temp Source Oral  Pulse Rate 82  Pulse Rate Source Monitor  Resp 20  BP (!) 174/69  BP Location Right Arm  BP Method Automatic  Patient Position (if appropriate) Lying  Oxygen Therapy  SpO2 100 %  O2 Device Room Air  Pain Assessment  Pain Scale 0-10  Pain Score 0  Dialysis Weight  Weight 94.3 kg  Type of Weight Post-Dialysis  Post-Hemodialysis Assessment  Rinseback Volume (mL) 250 mL  Dialyzer Clearance Lightly streaked  Duration of HD Treatment -hour(s) 2.5 hour(s)  Hemodialysis Intake (mL) 500 mL  UF Total -Machine (mL) 1010 mL  Net UF (mL) 510 mL  Tolerated HD Treatment Yes  Post-Hemodialysis Comments  (Treatment terminated early per MD)  AVG/AVF Arterial Site Held (minutes) 15 minutes  AVG/AVF Venous Site Held (minutes) 10 minutes  Fistula / Graft Left Upper arm  No Placement Date or Time found.   Orientation: Left  Access Location: Upper arm  Site Condition No complications  Fistula / Graft Assessment Present;Thrill;Bruit  Drainage Description None

## 2018-04-19 NOTE — Plan of Care (Signed)
  Problem: Education: Goal: Knowledge of General Education information will improve Description Including pain rating scale, medication(s)/side effects and non-pharmacologic comfort measures Outcome: Progressing   Problem: Health Behavior/Discharge Planning: Goal: Ability to manage health-related needs will improve Outcome: Progressing   Problem: Clinical Measurements: Goal: Ability to maintain clinical measurements within normal limits will improve Outcome: Progressing Goal: Will remain free from infection Outcome: Progressing Goal: Diagnostic test results will improve Outcome: Progressing Goal: Respiratory complications will improve Outcome: Progressing Goal: Cardiovascular complication will be avoided Outcome: Progressing   Problem: Coping: Goal: Level of anxiety will decrease Outcome: Progressing   Problem: Nutrition: Goal: Adequate nutrition will be maintained Outcome: Progressing   Problem: Elimination: Goal: Will not experience complications related to bowel motility Outcome: Progressing Goal: Will not experience complications related to urinary retention Outcome: Progressing

## 2018-04-19 NOTE — Progress Notes (Signed)
HD Treatment Initiated    04/19/18 1307  Vital Signs  Pulse Rate 83  Pulse Rate Source Monitor  Resp (!) 23  Oxygen Therapy  SpO2 100 %  O2 Device Nasal Cannula  O2 Flow Rate (L/min) 2 L/min  During Hemodialysis Assessment  Blood Flow Rate (mL/min) 400 mL/min  Arterial Pressure (mmHg) -100 mmHg  Venous Pressure (mmHg) 200 mmHg  Transmembrane Pressure (mmHg) 80 mmHg  Ultrafiltration Rate (mL/min) 430 mL/min  Dialysate Flow Rate (mL/min) 600 ml/min  Conductivity: Machine  14  HD Safety Checks Performed Yes  Dialysis Fluid Bolus Normal Saline  Bolus Amount (mL) 250 mL  Intra-Hemodialysis Comments Tx initiated;Progressing as prescribed  Fistula / Graft Left Upper arm  No Placement Date or Time found.   Orientation: Left  Access Location: Upper arm  Status Accessed  Needle Size 15

## 2018-04-19 NOTE — Progress Notes (Signed)
Patient returned from specials. A&O x4, but sleepy. Oxygen tubing on patient, but not hooked up to wall.  Tele in place. Tried to call dialysis for appointment time, but no one answered at this time. Bed alarm in place

## 2018-04-19 NOTE — Op Note (Signed)
    OPERATIVE NOTE   PROCEDURE: Placement right IJ Hickman catheter  PRE-OPERATIVE DIAGNOSIS: Sepsis  POST-OPERATIVE DIAGNOSIS: Same  SURGEON: Hortencia Pilar  ASSISTANT(S): Ms. Hezzie Bump  ANESTHESIA: Sedation with Versed intravenous only  ESTIMATED BLOOD LOSS: Less than 10 cc cc  FINDING(S): Normal jugular vein  SPECIMEN(S): None  INDICATIONS:   Jonathan Mcgrath is a 77 y.o. male who presents with Clostridium sepsis.:  Mass has been identified as a likely source.  Patient will require a long course of parenteral antibiotics and therefore a Hickman catheter is being placed.  He is an end-stage renal patient on hemodialysis and therefore it is felt that a PICC line is not appropriate.  Risks and benefits were reviewed with the patient he agrees to proceed.  DESCRIPTION: After full informed written consent was obtained from the patient, the patient was brought back to the operating room and placed supine upon the operating table.  Prior to induction, the patient received IV antibiotics.   After obtaining adequate anesthesia, the patient was then prepped and draped in the standard fashion for a right Hickman catheter.  1% lidocaine with epinephrine is infiltrated in the soft tissues at the base of the neck as well as on the chest wall.  Ultrasound is placed in sterile sleeve ultrasound is utilized secondary lack of appropriate landmarks and to avoid vascular injury.  Under direct ultrasound visualization the right internal jugular vein is accessed with a micropuncture needle.  Images recorded for the permanent record.  Microwire was then advanced under fluoroscopic guidance.  A exit site is selected and a small incision was made with 11 blade scalpel.  Counterincision was made at the wire insertion site as well.  The tunneling device is then used to pull the Hickman catheter from the exit site to the neck counterincision.  The catheter was then approximated on the chest wall  and trimmed to the appropriate length.  It is transected at the 23 cm mark.  The dilator peel-away sheath was then inserted over the wire wire and dilator removed and the catheter was advanced through the peel-away sheath.  Peel-away sheath is removed.  Catheter was then verified for proper position it is noted to be free of kinks in good position and therefore is flushed with heparinized saline and a sterile caps applied.  Neck counterincision was closed with a 4-0 Monocryl subcuticular the catheter secured to the chest wall with silk sutures.  Sterile dressing is applied.    Patient tolerated procedure well and there were no immediate comp occasions.    COMPLICATIONS: None  CONDITION: Margaretmary Dys [facility]  Office: [phone]   04/19/2018, 5:28 PM

## 2018-04-19 NOTE — Progress Notes (Signed)
Pre HD Assessment    04/19/18 1301  Neurological  Level of Consciousness Alert  Orientation Level Oriented X4  Respiratory  Respiratory Pattern Regular;Unlabored;Symmetrical  Chest Assessment Chest expansion symmetrical  Bilateral Breath Sounds Clear  Cardiac  Pulse Regular  Heart Sounds S1, S2  Jugular Venous Distention (JVD) No  ECG Monitor Yes  Cardiac Rhythm NSR  Vascular  R Radial Pulse +2  L Radial Pulse +2  R Dorsalis Pedis Pulse +2  L Dorsalis Pedis Pulse +2  Edema Generalized  Integumentary  Integumentary (WDL) X  Skin Color Appropriate for ethnicity  Skin Condition Dry  Skin Integrity Intact  Musculoskeletal  Musculoskeletal (WDL) X  Generalized Weakness Yes  GU Assessment  Genitourinary (WDL) WDL  Psychosocial  Psychosocial (WDL) WDL

## 2018-04-19 NOTE — Progress Notes (Signed)
PT Cancellation Note  Patient Details Name: Jonathan Mcgrath MRN: [id number] DOB: [birth year]   Cancelled Treatment:    Reason Eval/Treat Not Completed: Patient at procedure or test/unavailable Pt out of room for dialysis, unable to participate with PT this date.   Kreg Shropshire, DPT 04/19/2018, 2:00 PM

## 2018-04-19 NOTE — Progress Notes (Signed)
Pre HD Treatment    04/19/18 1300  Vital Signs  Temp 98.3 F (36.8 C)  Temp Source Oral  Pulse Rate 84  Pulse Rate Source Monitor  Resp 16  BP (!) 176/80  BP Location Right Arm  BP Method Automatic  Patient Position (if appropriate) Lying  Oxygen Therapy  SpO2 98 %  O2 Device Nasal Cannula  O2 Flow Rate (L/min) 2 L/min  Pain Assessment  Pain Scale 0-10  Pain Score 0  Dialysis Weight  Weight 95.1 kg  Type of Weight Pre-Dialysis  Time-Out for Hemodialysis  What Procedure? HD  Pt Identifiers(min of two) First/Last Name;MRN/Account#;Pt's DOB(use if MRN/Acct# not available  Correct Site? Yes  Correct Side? Yes  Correct Procedure? Yes  Consents Verified? Yes  Rad Studies Available? N/A  Safety Precautions Reviewed? Yes  Engineer, civil (consulting) Number  (3A)  Station Number 1  UF/Alarm Test Passed  Conductivity: Meter 13  Conductivity: Machine  14  pH 7.4  Reverse Osmosis Main  Normal Saline Lot Number [id number]  Dialyzer Lot Number [id number]  Disposable Set Lot Number [id number]  Machine Temperature 98.6 F (37 C)  Musician and Audible Yes  Blood Lines Intact and Secured Yes  Pre Treatment Patient Checks  Vascular access used during treatment Fistula  Hepatitis B Surface Antigen Results Negative  Date Hepatitis B Surface Antigen Drawn 09/05/17  Hepatitis B Surface Antibody  (>10)  Date Hepatitis B Surface Antibody Drawn 09/05/17  Hemodialysis Consent Verified Yes  Hemodialysis Standing Orders Initiated Yes  ECG (Telemetry) Monitor On Yes  Prime Ordered Normal Saline  Length of  DialysisTreatment -hour(s) 3.5 Hour(s)  Dialyzer Elisio 17H NR  Dialysate 2K, 2.5 Ca  Dialysis Anticoagulant None  Dialysate Flow Ordered 500  Blood Flow Rate Ordered 400 mL/min  Ultrafiltration Goal 1 Liters  Pre Treatment Labs Phosphorus;Other (Comment)  Dialysis Blood Pressure Support Ordered Normal Saline  Education / Care Plan  Dialysis Education Provided Yes  Documented  Education in Care Plan Yes  Fistula / Graft Left Upper arm  No Placement Date or Time found.   Orientation: Left  Access Location: Upper arm  Site Condition No complications  Fistula / Graft Assessment Present;Thrill;Bruit  Drainage Description None

## 2018-04-19 NOTE — H&P (Signed)
[facility] History & Physical Update  The patient was interviewed and re-examined.  The patient's previous History and Physical has been reviewed and is unchanged.  There is no change in the plan of care. We plan to proceed with the scheduled procedure.  Hortencia Pilar, MD  04/19/2018, 5:02 PM

## 2018-04-19 NOTE — Progress Notes (Signed)
*  PRELIMINARY RESULTS* Echocardiogram Echocardiogram Transesophageal has been performed.  Sherrie Sport 04/19/2018, 8:53 AM

## 2018-04-19 NOTE — Progress Notes (Signed)
Dr. Holley Raring phoned and states to end patient's treatment now so that patient may have procedure completed.  Specials recovery PA phoned to follow-up.   HD Treatment Ended    04/19/18 1545  Vital Signs  Pulse Rate 87  Pulse Rate Source Monitor  Resp 15  BP (!) 170/67  BP Location Right Arm  BP Method Automatic  Patient Position (if appropriate) Lying  Oxygen Therapy  SpO2 96 %  O2 Device Room Air  During Hemodialysis Assessment  Blood Flow Rate (mL/min) 400 mL/min  Arterial Pressure (mmHg) -180 mmHg  Venous Pressure (mmHg) 180 mmHg  Transmembrane Pressure (mmHg) 60 mmHg  Ultrafiltration Rate (mL/min) 430 mL/min  Dialysate Flow Rate (mL/min) 600 ml/min  Conductivity: Machine  13.6  HD Safety Checks Performed Yes  Intra-Hemodialysis Comments See progress note;Tx completed  Fistula / Graft Left Upper arm  No Placement Date or Time found.   Orientation: Left  Access Location: Upper arm  Status Deaccessed

## 2018-04-19 NOTE — Progress Notes (Signed)
Patient ID: Jonathan Mcgrath, male   DOB: 1940-11-30, 77 y.o.   MRN: [id number]  [employer] PROGRESS NOTE  Jonathan Mcgrath SWN:[id number] DOB: 22-Feb-1941 DOA: 04/12/2018 PCP: [name], MD  HPI/Subjective: Patient feeling okay.  Still with occasional nausea and occasional abdominal pain.  Some diarrhea.  Objective: Vitals:   04/19/18 1415 04/19/18 1430  BP: (!) 164/73 (!) 177/82  Pulse: 78 71  Resp: 19 16  Temp:    SpO2: 98% 96%    Filed Weights   04/19/18 0306 04/19/18 0743 04/19/18 1300  Weight: 95.1 kg 95.1 kg 95.1 kg    ROS: Review of Systems  Constitutional: Negative for chills and fever.  Eyes: Negative for blurred vision.  Respiratory: Negative for cough and shortness of breath.   Cardiovascular: Negative for chest pain.  Gastrointestinal: Positive for abdominal pain, diarrhea and nausea. Negative for constipation and vomiting.  Genitourinary: Negative for dysuria.  Musculoskeletal: Negative for joint pain.  Neurological: Negative for dizziness and headaches.   Exam: Physical Exam  Constitutional: He is oriented to person, place, and time.  HENT:  Nose: No mucosal edema.  Mouth/Throat: No oropharyngeal exudate or posterior oropharyngeal edema.  Eyes: Pupils are equal, round, and reactive to light. Conjunctivae, EOM and lids are normal.  Neck: No JVD present. Carotid bruit is not present. No edema present. No thyroid mass and no thyromegaly present.  Cardiovascular: S1 normal and S2 normal. Exam reveals no gallop.  No murmur heard. Pulses:      Dorsalis pedis pulses are 2+ on the right side, and 2+ on the left side.  Respiratory: No respiratory distress. He has no wheezes. He has no rhonchi. He has no rales.  GI: Soft. Bowel sounds are normal. There is tenderness in the left lower quadrant.  Musculoskeletal:       Right ankle: He exhibits swelling.       Left ankle: He exhibits swelling.  Lymphadenopathy:    He has no cervical adenopathy.   Neurological: He is alert and oriented to person, place, and time. No cranial nerve deficit.  Skin: Skin is warm. No rash noted. Nails show no clubbing.  Psychiatric: He has a normal mood and affect.      Data Reviewed: Basic Metabolic Panel: Recent Labs  Lab 04/13/18 0156 04/15/18 1614 04/16/18 0319 04/19/18 1230  NA 139  --  138  --   K 3.8  --  3.8  --   CL 98  --  97*  --   CO2 32  --  32  --   GLUCOSE 98  --  108*  --   BUN 23  --  26*  --   CREATININE 5.89*  --  6.59*  --   CALCIUM 8.4*  --  8.0*  --   PHOS  --  5.5*  --  5.4*   CBC: Recent Labs  Lab 04/13/18 0511 04/14/18 0457 04/15/18 1614 04/16/18 0319 04/18/18 1356  WBC 21.9* 17.4* 14.6* 12.2* 12.2*  HGB 10.6* 10.7* 10.3* 10.2* 10.0*  HCT 31.3* 29.9* 31.0* 29.4* 28.7*  MCV 91.8 91.1 93.4 93.3 93.7  PLT 116* 112* 130* 113* 161     Recent Results (from the past 240 hour(s))  Culture, blood (Routine x 2)     Status: Abnormal   Collection Time: 04/12/18  6:37 AM  Result Value Ref Range Status   Specimen Description   Final    BLOOD RIGHT ARM Performed at [facility], [place]  [address] [place], [place] [address]    Special Requests   Final    BOTTLES DRAWN AEROBIC AND ANAEROBIC Blood Culture adequate volume Performed at [facility], [address] [place], [place] [address]    Culture  Setup Time   Final    GRAM POSITIVE RODS ANAEROBIC BOTTLE ONLY CRITICAL RESULT CALLED TO, READ BACK BY AND VERIFIED WITH: DAVID BESANTI AT 2358 04/12/18.PMH Performed at [facility], [address] [place], [place] [address]    Culture CLOSTRIDIUM SPECIES (A)  Final   Report Status 04/16/2018 FINAL  Final  Culture, blood (Routine x 2)     Status: Abnormal   Collection Time: 04/12/18  6:39 AM  Result Value Ref Range Status   Specimen Description   Final    BLOOD RIGHT HAND Performed at [facility], [address], [place], [place] [address]    Special Requests   Final     BOTTLES DRAWN AEROBIC AND ANAEROBIC Blood Culture adequate volume Performed at [facility], [address], [place], [place] [address]    Culture  Setup Time   Final    ANAEROBIC BOTTLE ONLY GRAM POSITIVE RODS CRITICAL VALUE NOTED.  VALUE IS CONSISTENT WITH PREVIOUSLY REPORTED AND CALLED VALUE. Performed at [facility], [address] [place], [place] [address]    Culture CLOSTRIDIUM SEPTICUM (A)  Final   Report Status 04/16/2018 FINAL  Final  Gastrointestinal Panel by PCR , Stool     Status: Abnormal   Collection Time: 04/12/18  7:02 AM  Result Value Ref Range Status   Campylobacter species NOT DETECTED NOT DETECTED Final   Plesimonas shigelloides NOT DETECTED NOT DETECTED Final   Salmonella species NOT DETECTED NOT DETECTED Final   Yersinia enterocolitica NOT DETECTED NOT DETECTED Final   Vibrio species NOT DETECTED NOT DETECTED Final   Vibrio cholerae NOT DETECTED NOT DETECTED Final   Enteroaggregative E coli (EAEC) NOT DETECTED NOT DETECTED Final   Enteropathogenic E coli (EPEC) DETECTED (A) NOT DETECTED Final    Comment: RESULT CALLED TO, READ BACK BY AND VERIFIED WITH: MATTHEW WRIGHT 04/12/18 @ 1751  MLK    Enterotoxigenic E coli (ETEC) NOT DETECTED NOT DETECTED Final   Shiga like toxin producing E coli (STEC) NOT DETECTED NOT DETECTED Final   Shigella/Enteroinvasive E coli (EIEC) NOT DETECTED NOT DETECTED Final   Cryptosporidium NOT DETECTED NOT DETECTED Final   Cyclospora cayetanensis NOT DETECTED NOT DETECTED Final   Entamoeba histolytica NOT DETECTED NOT DETECTED Final   Giardia lamblia NOT DETECTED NOT DETECTED Final   Adenovirus F40/41 NOT DETECTED NOT DETECTED Final   Astrovirus NOT DETECTED NOT DETECTED Final   Norovirus GI/GII NOT DETECTED NOT DETECTED Final   Rotavirus A NOT DETECTED NOT DETECTED Final   Sapovirus (I, II, IV, and V) NOT DETECTED NOT DETECTED Final    Comment: Performed at [facility], [address]  [place], [place] [address]  C difficile quick scan w PCR reflex     Status: Abnormal   Collection Time: 04/12/18  7:02 AM  Result Value Ref Range Status   C Diff antigen POSITIVE (A) NEGATIVE Final   C Diff toxin NEGATIVE NEGATIVE Final   C Diff interpretation Results are indeterminate. See PCR results.  Final    Comment: Performed at [facility], [address] [place], [place] [address]  C. Diff by PCR, Reflexed     Status: None   Collection Time: 04/12/18  7:02 AM  Result Value Ref Range Status   Toxigenic C. Difficile by PCR NEGATIVE NEGATIVE Final    Comment: Patient is colonized with non toxigenic C. difficile. May not need treatment unless significant symptoms are present. Performed at [facility], [address] [place], [place] [address]   MRSA PCR Screening     Status: None   Collection Time: 04/12/18  7:00 PM  Result Value Ref Range Status   MRSA by PCR NEGATIVE NEGATIVE Final    Comment:        The GeneXpert MRSA Assay (FDA approved for NASAL specimens only), is one component of a comprehensive MRSA colonization surveillance program. It is not intended to diagnose MRSA infection nor to guide or monitor treatment for MRSA infections. Performed at [facility], [address] [place], [place] [address]   Culture, blood (Routine X 2) w Reflex to ID Panel     Status: None (Preliminary result)   Collection Time: 04/17/18 12:53 AM  Result Value Ref Range Status   Specimen Description BLOOD BLOOD RIGHT ARM  Final   Special Requests   Final    BOTTLES DRAWN AEROBIC AND ANAEROBIC Blood Culture adequate volume   Culture   Final    NO GROWTH 2 DAYS Performed at [facility], [address], [place], [place] [address]    Report Status PENDING  Incomplete  Culture, blood (Routine X 2) w Reflex to ID Panel     Status: None (Preliminary result)   Collection Time: 04/17/18 12:59 AM  Result Value Ref Range Status   Specimen Description  BLOOD BLOOD RIGHT WRIST  Final   Special Requests   Final    BOTTLES DRAWN AEROBIC AND ANAEROBIC Blood Culture adequate volume   Culture   Final    NO GROWTH 2 DAYS Performed at [facility], [address], [place], [place] [address]    Report Status PENDING  Incomplete      Scheduled Meds: . amLODipine  5 mg Oral Daily  . Chlorhexidine Gluconate Cloth  6 each Topical Q0600  . Chlorhexidine Gluconate Cloth  6 each Topical Once  . fentaNYL      . midazolam      . multivitamin  1 tablet Oral Daily  . sodium chloride flush  3 mL Intravenous Q12H  . sodium chloride flush       Continuous Infusions: . sodium chloride 10 mL/hr at 04/19/18 0753  . ampicillin-sulbactam (UNASYN) IV 3 g (04/19/18 0140)  . famotidine (PEPCID) IV Stopped (04/18/18 1539)    Assessment/Plan:  1. Clostridium septicum bacteremia, aortitis. Infectious disease and she recommended at least 4 weeks of IV antibiotics.  TEE negative for endocarditis.  Vascular surgery needs to place an access prior to disposition. (PICC line team will not touch a dialysis patient and the IR team deferred to vascular surgery).  The vascular team potentially can get to him today (but patient down to dialysis this afternoon) versus next week. 2. Colon mass.  Biopsies to rule out cancerous process.  Clostridium septicum bacteremia can go along with a GI malignancy.  Sent off CEA.  CT scan of the chest abdomen and pelvis did not show any signs of metastases.  Follow-up with oncology and general surgery as outpatient.  Pathology still pending. 3. Abdominal pain, nausea.  Stool culture growing out enteropathogenic E. Coli.  Empiric Pepcid. 4. End-stage renal disease on hemodialysis Monday Wednesday Friday.  Currently down at dialysis. 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  This is likely secondary to infection rather than rheumatological process.  Follow-up as outpatient.  Code Status:      Code Status Orders  (From admission, onward)         Start     Ordered   04/12/18 1311  Do not attempt resuscitation (DNR)  Continuous    Question Answer Comment  In the event of cardiac or respiratory ARREST Do not call a "code blue"   In the event of cardiac or respiratory ARREST Do not perform Intubation, CPR, defibrillation or ACLS   In the event of cardiac or respiratory ARREST Use medication by any route, position, wound care, and other measures to relive pain and suffering. May use oxygen, suction and manual treatment of airway obstruction as needed for comfort.      04/12/18 1310        Code Status History    Date Active Date Inactive Code Status Order ID Comments User Context   04/12/2018 0827 04/12/2018 1310 DNR [id number]  Dustin Flock, MD ED   08/17/2016 0352 08/18/2016 2236 Full Code [id number]  Saundra Shelling, MD Inpatient   01/12/2016 1417 01/12/2016 1754 Full Code [id number]  Algernon Huxley, MD Inpatient      Disposition Plan: To be determined based on when patient gets his vascular access for home antibiotics.  Consultants:  Vascular surgery  Nephrology  Infectious disease  Gastroenterology  General surgery  Oncology  Time spent: 28 minutes  [place]

## 2018-04-20 ENCOUNTER — Encounter: Payer: Self-pay | Admitting: Gastroenterology

## 2018-04-20 MED ORDER — SODIUM CHLORIDE 0.9 % IV SOLN: 3.0000 g | Freq: Two times a day (BID) | INTRAVENOUS | 0 refills | Status: DC

## 2018-04-20 MED ORDER — AMLODIPINE BESYLATE 5 MG PO TABS: 5.0000 mg | ORAL_TABLET | Freq: Every day | ORAL | 0 refills | Status: DC

## 2018-04-20 NOTE — Discharge Summary (Signed)
[place] at [facility], 77 y.o., DOB [birth year], MRN [id number]. Admission date: 04/12/2018 Discharge Date 04/20/2018 Primary MD [name], MD Admitting Physician Dustin Flock, MD  Admission Diagnosis  Sepsis, due to unspecified organism [A41.9]  Discharge Diagnosis   Active Problems: Clostridium septicum bacteremia, aortitis.  Colon mass ESRD  Regional aortitis    Hospital Course PER BEAGLEY is a 77 y.o. with a history of ESRD, HTN , DM2;Admitted  due to nausea and vomiitng, diarrhea and fever with chills. Pt was doing well on wed morning when he went for dialysis, got the flu vaccine, when he returned home he has some nausea and vomiting but had to attend a funeral. He then developed diarrhea. Had Stool tests which showed enteropathogenic ecoli, and cdiff antigen positive but negative toxin and Neg PCR. Blood cultures were sent and he was started on vanco and cefepime and flagyl .Marland Kitchen He had a CT abdomen which showed Mild inflammatory changes surrounding the inferior mesenteric artery and inferior mesenteric vein along the anterior aspect of the aorta and left common iliac artery. No focal fluid collection is noted. He had CTA on 9/30 which showed Circumferential arterial wall thickening beginning at the aortic bifurcation and extending along the left greater than right common iliac arteries. There are associated inflammatory changes with inflammatory stranding in the immediately adjacent retroperitoneal fat and left pelvic sidewall. 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 which may be infectious, or inflammatory in etiology patient is blood cultures came back positive for Clostridium septicemia he was recommended to be treated with Unasyn twice daily.  He had to have another access.  To get these antibiotics.  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           Consults  GI, id  Significant Tests:  See full reports for all details     Ct Abdomen Pelvis Wo Contrast  Result Date: 04/12/2018 CLINICAL DATA:  Fever and chills, nausea and diarrhea EXAM: CT ABDOMEN AND PELVIS WITHOUT CONTRAST TECHNIQUE: Multidetector CT imaging of the abdomen and pelvis was performed following the standard protocol without IV contrast. COMPARISON:  01/28/2011. FINDINGS: Lower chest: Lung bases are clear. Moderate-sized hiatal hernia is noted. Hepatobiliary: Tiny dependent gallstones are noted. The liver is within normal limits. Pancreas: Unremarkable. No pancreatic ductal dilatation or surrounding inflammatory changes. Spleen: Normal in size without focal abnormality. Adrenals/Urinary Tract: Adrenal glands are within normal limits. The kidneys are well visualized and demonstrate tiny renal calculi bilaterally. Some vascular calcifications are noted as well. In the upper pole of the left kidney there is a vague hypodensity which measures 3.2 cm in greatest dimension. This was seen on a prior CT examination measuring 2.6 cm. Bladder is well distended. No obstructive changes are seen. Stomach/Bowel: Minimal diverticular change of the colon is noted. No obstructive or inflammatory changes of the colon are seen. The appendix is well visualized and within normal limits. No obstructive small bowel changes are seen. Vascular/Lymphatic: Aortic calcifications are noted. No significant lymphadenopathy is identified. Reproductive: Prostate is unremarkable. Other: No free fluid is noted within the abdomen or pelvis. Along the anterior aspect of the distal aorta and proximal common iliac artery there is some vague inflammatory changes surrounding the mesenteric vessels. No significant lymphadenopathy is noted. No focal fluid collection is seen. Musculoskeletal: Degenerative changes of lumbar spine are noted. IMPRESSION: Mild inflammatory changes surrounding the  inferior mesenteric artery and inferior  mesenteric vein along the anterior aspect of the aorta and left common iliac artery. No focal fluid collection is noted. The etiology is uncertain as no inflamed loops of bowel are noted adjacent to these changes. This may be related to some localized venous thrombosis. Moderate-sized hiatal hernia. Cholelithiasis without complicating factors. Hypodensity in the upper pole of the left kidney which is larger than that seen in 2012 but likely represents a complicated cyst. Nonemergent ultrasound may be helpful for further evaluation. Electronically Signed   By: Inez Catalina M.D.   On: 04/12/2018 07:52   Dg Chest 2 View  Result Date: 04/12/2018 CLINICAL DATA:  77 year old male with a history of nausea vomiting EXAM: CHEST - 2 VIEW COMPARISON:  09/24/2017 FINDINGS: Cardiomediastinal silhouette unchanged in size and contour. Mild interlobular septal thickening. No pleural effusion or pneumothorax. No confluent airspace disease. Stent within left upper extremity vasculature. No displaced fracture.  Degenerative changes of the spine. IMPRESSION: Mild interlobular septal thickening may represent chronic changes in the setting of prior episodes of CHF, and/or mild acute edema. Electronically Signed   By: Corrie Mckusick D.O.   On: 04/12/2018 07:08   Ct Chest W Contrast  Result Date: 04/18/2018 CLINICAL DATA:  Recent diagnosis of colon cancer.  Staging exam. EXAM: CT CHEST WITH CONTRAST TECHNIQUE: Multidetector CT imaging of the chest was performed during intravenous contrast administration. CONTRAST:  75 mL OMNIPAQUE IOHEXOL 300 MG/ML  SOLN COMPARISON:  PA and lateral chest 04/12/2018 and 08/16/2016. FINDINGS: Cardiovascular: Heart size is normal. No pericardial effusion. Scattered calcific aortic and coronary atherosclerotic calcifications are identified. Calcification of the aortic valve is also noted. Mediastinum/Nodes: No enlarged mediastinal, hilar, or axillary lymph nodes.  Thyroid gland, trachea, and esophagus demonstrate no significant findings. Small to moderate hiatal hernia is identified. Lungs/Pleura: No pulmonary nodule, mass or consolidative process is identified. Minimal dependent atelectasis is noted. Trace left pleural effusion is seen. Upper Abdomen: Contrast material in the gallbladder consistent with vicarious excretion is noted. No acute abnormality is identified that cyst in the upper pole of the left kidney is unchanged. No acute abnormality is identified. Musculoskeletal: No lytic or sclerotic lesion is identified. Bilateral gynecomastia is seen. IMPRESSION: Negative for metastatic disease. Very small left pleural effusion. Small to moderate hiatal hernia. Calcific coronary artery disease. Gynecomastia. Aortic Atherosclerosis (ICD10-I70.0). Electronically Signed   By: Inge Rise M.D.   On: 04/18/2018 14:44   [place] Ekg Site Rite  Result Date: 04/18/2018 If Site Rite image not attached, placement could not be confirmed due to current cardiac rhythm.  Ct Angio Abd/pel W/ And/or W/o  Result Date: 04/15/2018 CLINICAL DATA:  77 year old male with nausea, vomiting, diarrhea and stool cultures positive for enteropathogenic E coli. EXAM: CT ANGIOGRAPHY ABDOMEN AND PELVIS WITH CONTRAST AND WITHOUT CONTRAST TECHNIQUE: Multidetector CT imaging of the abdomen and pelvis was performed using the standard protocol during bolus administration of intravenous contrast. Multiplanar reconstructed images and MIPs were obtained and reviewed to evaluate the vascular anatomy. CONTRAST:  159mL ISOVUE-370 IOPAMIDOL (ISOVUE-370) INJECTION 76% COMPARISON:  CT scan of the abdomen and pelvis 04/12/2018 FINDINGS: VASCULAR Aorta: Mild heterogeneous atherosclerotic plaque. No evidence of aneurysm or dissection. Celiac: Patent without evidence of aneurysm, dissection, vasculitis or significant stenosis. SMA: Patent without evidence of aneurysm, dissection, vasculitis or significant stenosis.  Renals: Both renal arteries are patent without evidence of aneurysm, dissection, vasculitis, fibromuscular dysplasia or significant stenosis. IMA: Patent without evidence of aneurysm, dissection, vasculitis or significant stenosis. Inflow: Circumferential arterial wall thickening beginning  at the aortic bifurcation and extending along both common iliac arteries, particularly the left. On the right, the inflammatory changes. Prior to the bifurcation. On the left, the inflammatory changes extend beyond the bifurcation and along the external iliac artery for a short distance. No significant stenosis, dissection or aneurysm. There is mild atherosclerotic plaque and irregularity along the left common iliac artery. Moderate focal plaque along the right common iliac artery. The external iliac arteries are relatively spared from disease. Proximal Outflow: Bilateral common femoral and visualized portions of the superficial and profunda femoral arteries are patent without evidence of aneurysm, dissection, vasculitis or significant stenosis. Veins: Venous opacification of the IVC and iliac veins is relatively limited on the portal venous phase imaging. No definite thrombus or asymmetry identified. The hepatic, portal and renal veins are all widely patent as are the splanchnic veins. Review of the MIP images confirms the above findings. NON-VASCULAR Lower chest: Incompletely imaged aortic valvular calcifications. Cardiomegaly. No pericardial effusion. Trace left-sided pleural effusion and associated mild atelectasis. No pulmonary edema or focal airspace consolidation. Mild scarring in the inferior right middle lobe. Moderately large sliding hiatal hernia. Hepatobiliary: Geographic hypoattenuation in the left hemi-liver adjacent to the fissure for the falciform ligament is nonspecific but most suggestive of benign focal fatty infiltration. Otherwise, no solid hepatic lesion. Subtle gallstones were better visualized on the recent  noncontrast enhanced CT scan. No intra or extrahepatic biliary ductal dilatation. Pancreas: Unremarkable. No pancreatic ductal dilatation or surrounding inflammatory changes. Spleen: Normal in size without focal abnormality. Adrenals/Urinary Tract: Normal adrenal glands. 2.3 cm nonenhancing simple cyst centrally in the upper pole of the left kidney. Punctate nonobstructing right lower pole nephrolithiasis again noted. No evidence of hydronephrosis. The left ureter passes through the inflammatory changes overlying the left iliac vessels without evidence of external compression. The right ureter is unremarkable. The bladder is within normal limits. Stomach/Bowel: No evidence of obstruction or focal bowel wall thickening. Normal appendix in the right lower quadrant. The terminal ileum is unremarkable. Lymphatic: No suspicious lymphadenopathy. Reproductive: Prostatomegaly. Other: Small fat containing umbilical hernia. a No abdominopelvic ascites. Musculoskeletal: No acute fracture or aggressive appearing lytic or blastic osseous lesion. Multilevel degenerative disc disease. There is a linear tract extending from the skin surface inferiorly over the left anterior abdominal wall. A small peripherally high density nodular region is present just superficial to the left rectus abdominus muscle. Given the configuration, this likely represents chronic changes related to the presence of a prior peritoneal dialysis catheter. IMPRESSION: VASCULAR 1. Circumferential arterial wall thickening beginning at the aortic bifurcation and extending along the left greater than right common iliac arteries. There are associated inflammatory changes with inflammatory stranding in the immediately adjacent retroperitoneal fat and left pelvic sidewall. 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 Recommend vascular surgery consultation if not already obtained. 2. No  evidence of deep venous thrombosis at this time. 3. Aortic Atherosclerosis (ICD10-170.0). 4. Cardiomegaly. 5. Aortic valvular calcifications. NON-VASCULAR 1. No acute nonvascular abnormality within the abdomen or pelvis. 2. Nonobstructing right lower pole nephrolithiasis. 3. Left upper pole simple renal cyst. 4. Trace left-sided pleural effusion. 5. Multilevel degenerative disc disease. 6. Small umbilical hernia. 7. Moderate hiatal hernia. 8. Additional ancillary findings as above. Signed, Criselda Peaches, MD, [facility] Electronically Signed   By: Jacqulynn Cadet M.D.   On: 04/15/2018 09:55       Today   Subjective:  Jonathan [place]  Pt doing much better  Objective:   Blood pressure (!) 166/81, pulse 83, temperature 98.8 F (37.1 C), temperature source Oral, resp. rate 18, height 6\' 1"  (1.854 m), weight 94.3 kg, SpO2 98 %.  .  Intake/Output Summary (Last 24 hours) at 04/20/2018 1402 Last data filed at 04/20/2018 0900 Gross per 24 hour  Intake 410 ml  Output 510 ml  Net -100 ml    Exam VITAL SIGNS: Blood pressure (!) 166/81, pulse 83, temperature 98.8 F (37.1 C), temperature source Oral, resp. rate 18, height 6\' 1"  (1.854 m), weight 94.3 kg, SpO2 98 %.  GENERAL:  77 y.o.-year-old patient lying in the bed with no acute distress.  EYES: Pupils equal, round, reactive to light and accommodation. No scleral icterus. Extraocular muscles intact.  HEENT: Head atraumatic, normocephalic. Oropharynx and nasopharynx clear.  NECK:  Supple, no jugular venous distention. No thyroid enlargement, no tenderness.  LUNGS: Normal breath sounds bilaterally, no wheezing, rales,rhonchi or crepitation. No use of accessory muscles of respiration.  CARDIOVASCULAR: S1, S2 normal. No murmurs, rubs, or gallops.  ABDOMEN: Soft, nontender, nondistended. Bowel sounds present. No organomegaly or mass.  EXTREMITIES: No pedal edema, cyanosis, or  clubbing.  NEUROLOGIC: Cranial nerves II through XII are intact. Muscle strength 5/5 in all extremities. Sensation intact. Gait not checked.  PSYCHIATRIC: The patient is alert and oriented x 3.  SKIN: No obvious rash, lesion, or ulcer.   Data Review     CBC w Diff:  Lab Results  Component Value Date   WBC 12.2 (H) 04/18/2018   HGB 10.0 (L) 04/18/2018   HGB 10.6 (L) 04/12/2014   HCT 28.7 (L) 04/18/2018   HCT 33.5 (L) 04/12/2014   PLT 161 04/18/2018   PLT 123 (L) 04/12/2014   LYMPHOPCT 45 04/12/2018   LYMPHOPCT 63.9 04/11/2014   MONOPCT 5 04/12/2018   MONOPCT 4 04/12/2014   MONOPCT 6.3 04/11/2014   EOSPCT 0 04/12/2018   EOSPCT 0.8 04/11/2014   BASOPCT 0 04/12/2018   BASOPCT 0.8 04/11/2014   CMP:  Lab Results  Component Value Date   NA 138 04/16/2018   NA 140 04/13/2014   K 3.8 04/16/2018   K 4.6 04/13/2014   CL 97 (L) 04/16/2018   CL 109 (H) 04/13/2014   CO2 32 04/16/2018   CO2 24 04/13/2014   BUN 26 (H) 04/16/2018   BUN 79 (H) 04/13/2014   CREATININE 6.59 (H) 04/16/2018   CREATININE 9.83 (H) 04/13/2014   PROT 6.7 04/12/2018   PROT 6.4 04/11/2014   ALBUMIN 3.7 04/12/2018   ALBUMIN 3.2 (L) 04/11/2014   BILITOT 1.2 04/12/2018   BILITOT 0.6 04/11/2014   ALKPHOS 77 04/12/2018   ALKPHOS 91 04/11/2014   AST 24 04/12/2018   AST 39 (H) 04/11/2014   ALT 17 04/12/2018   ALT 50 04/11/2014  .  Micro Results Recent Results (from the past 240 hour(s))  Culture, blood (Routine x 2)     Status: Abnormal   Collection Time: 04/12/18  6:37 AM  Result Value Ref Range Status   Specimen Description   Final    BLOOD RIGHT ARM Performed at [facility], [address] [place], [place] [address]    Special Requests   Final    BOTTLES DRAWN AEROBIC AND ANAEROBIC Blood Culture adequate volume Performed at [facility], [address] [place], [place] [address]    Culture  Setup Time   Final    GRAM POSITIVE RODS ANAEROBIC  BOTTLE ONLY CRITICAL RESULT  CALLED TO, READ BACK BY AND VERIFIED WITH: DAVID BESANTI AT 2358 04/12/18.PMH Performed at [facility], [address] [place], [place] [address]    Culture CLOSTRIDIUM SPECIES (A)  Final   Report Status 04/16/2018 FINAL  Final  Culture, blood (Routine x 2)     Status: Abnormal   Collection Time: 04/12/18  6:39 AM  Result Value Ref Range Status   Specimen Description   Final    BLOOD RIGHT HAND Performed at [facility], [address], [place], [place] [address]    Special Requests   Final    BOTTLES DRAWN AEROBIC AND ANAEROBIC Blood Culture adequate volume Performed at [facility], [address], [place], [place] [address]    Culture  Setup Time   Final    ANAEROBIC BOTTLE ONLY GRAM POSITIVE RODS CRITICAL VALUE NOTED.  VALUE IS CONSISTENT WITH PREVIOUSLY REPORTED AND CALLED VALUE. Performed at [facility], [address] [place], [place] [address]    Culture CLOSTRIDIUM SEPTICUM (A)  Final   Report Status 04/16/2018 FINAL  Final  Gastrointestinal Panel by PCR , Stool     Status: Abnormal   Collection Time: 04/12/18  7:02 AM  Result Value Ref Range Status   Campylobacter species NOT DETECTED NOT DETECTED Final   Plesimonas shigelloides NOT DETECTED NOT DETECTED Final   Salmonella species NOT DETECTED NOT DETECTED Final   Yersinia enterocolitica NOT DETECTED NOT DETECTED Final   Vibrio species NOT DETECTED NOT DETECTED Final   Vibrio cholerae NOT DETECTED NOT DETECTED Final   Enteroaggregative E coli (EAEC) NOT DETECTED NOT DETECTED Final   Enteropathogenic E coli (EPEC) DETECTED (A) NOT DETECTED Final    Comment: RESULT CALLED TO, READ BACK BY AND VERIFIED WITH: MATTHEW WRIGHT 04/12/18 @ 1751  MLK    Enterotoxigenic E coli (ETEC) NOT DETECTED NOT DETECTED Final   Shiga like toxin producing E coli (STEC) NOT DETECTED NOT DETECTED Final   Shigella/Enteroinvasive E coli (EIEC) NOT DETECTED NOT DETECTED Final   Cryptosporidium NOT  DETECTED NOT DETECTED Final   Cyclospora cayetanensis NOT DETECTED NOT DETECTED Final   Entamoeba histolytica NOT DETECTED NOT DETECTED Final   Giardia lamblia NOT DETECTED NOT DETECTED Final   Adenovirus F40/41 NOT DETECTED NOT DETECTED Final   Astrovirus NOT DETECTED NOT DETECTED Final   Norovirus GI/GII NOT DETECTED NOT DETECTED Final   Rotavirus A NOT DETECTED NOT DETECTED Final   Sapovirus (I, II, IV, and V) NOT DETECTED NOT DETECTED Final    Comment: Performed at [facility], [address] [place], [place] [address]  C difficile quick scan w PCR reflex     Status: Abnormal   Collection Time: 04/12/18  7:02 AM  Result Value Ref Range Status   C Diff antigen POSITIVE (A) NEGATIVE Final   C Diff toxin NEGATIVE NEGATIVE Final   C Diff interpretation Results are indeterminate. See PCR results.  Final    Comment: Performed at [facility], [address] [place], [place] [address]  C. Diff by PCR, Reflexed     Status: None   Collection Time: 04/12/18  7:02 AM  Result Value Ref Range Status   Toxigenic C. Difficile by PCR NEGATIVE NEGATIVE Final    Comment: Patient is colonized with non toxigenic C. difficile. May not need treatment unless significant symptoms are present. Performed at [facility], [address] [place], [place] [address]   MRSA PCR Screening  Status: None   Collection Time: 04/12/18  7:00 PM  Result Value Ref Range Status   MRSA by PCR NEGATIVE NEGATIVE Final    Comment:        The GeneXpert MRSA Assay (FDA approved for NASAL specimens only), is one component of a comprehensive MRSA colonization surveillance program. It is not intended to diagnose MRSA infection nor to guide or monitor treatment for MRSA infections. Performed at [facility], [address] [place], [place] [address]   Culture, blood (Routine X 2) w Reflex to ID Panel     Status: None (Preliminary result)   Collection Time: 04/17/18 12:53 AM   Result Value Ref Range Status   Specimen Description BLOOD BLOOD RIGHT ARM  Final   Special Requests   Final    BOTTLES DRAWN AEROBIC AND ANAEROBIC Blood Culture adequate volume   Culture   Final    NO GROWTH 3 DAYS Performed at [facility], [address], [place], [place] [address]    Report Status PENDING  Incomplete  Culture, blood (Routine X 2) w Reflex to ID Panel     Status: None (Preliminary result)   Collection Time: 04/17/18 12:59 AM  Result Value Ref Range Status   Specimen Description BLOOD BLOOD RIGHT WRIST  Final   Special Requests   Final    BOTTLES DRAWN AEROBIC AND ANAEROBIC Blood Culture adequate volume   Culture   Final    NO GROWTH 3 DAYS Performed at [facility], [address] [place], [place] [address]    Report Status PENDING  Incomplete        Code Status Orders  (From admission, onward)         Start     Ordered   04/12/18 1311  Do not attempt resuscitation (DNR)  Continuous    Question Answer Comment  In the event of cardiac or respiratory ARREST Do not call a "code blue"   In the event of cardiac or respiratory ARREST Do not perform Intubation, CPR, defibrillation or ACLS   In the event of cardiac or respiratory ARREST Use medication by any route, position, wound care, and other measures to relive pain and suffering. May use oxygen, suction and manual treatment of airway obstruction as needed for comfort.      04/12/18 1310        Code Status History    Date Active Date Inactive Code Status Order ID Comments User Context   04/12/2018 0827 04/12/2018 1310 DNR [id number]  Dustin Flock, MD ED   08/17/2016 0352 08/18/2016 2236 Full Code [id number]  Saundra Shelling, MD Inpatient   01/12/2016 1417 01/12/2016 1754 Full Code [id number]  Dew, Erskine Squibb, MD Inpatient          Follow-up Information    Schnier, Dolores Lory, MD Follow up in 3 month(s).   Specialties:  Vascular Surgery, Cardiology, Radiology, Vascular Surgery Why:   Patient will need AAA to rule out inflammatory aneurysym in three months.  Contact information: [place] [address] [phone]        [name], MD Follow up in 6 day(s).   Specialty:  Family Medicine Why:  hosp f/u Contact information: [place] [address] [phone]        Benjamine Sprague, DO Follow up in 1 week(s).   Specialty:  Surgery Why:  colon mass Contact information: [address] [place] [address] [phone]        Rogue Bussing,  Elisha Headland, MD Follow up in 1 week(s).   Specialties:  Internal Medicine, Oncology Why:  colon cancer Contact information: [place] [address] [phone]           Discharge Medications   Allergies as of 04/20/2018   No Known Allergies     Medication List    TAKE these medications   amLODipine 5 MG tablet Commonly known as:  NORVASC Take 1 tablet (5 mg total) by mouth daily. Start taking on:  04/21/2018   Ampicillin-Sulbactam 3 g in sodium chloride 0.9 % 100 mL Inject 3 g into the vein every 12 (twelve) hours for 22 days.   cinacalcet 30 MG tablet Commonly known as:  SENSIPAR Take 30 mg by mouth every morning.   ferric citrate 1 GM 210 MG(Fe) tablet Commonly known as:  AURYXIA Take 1 tablet by mouth daily.   multivitamin Tabs tablet Take 1 tablet by mouth daily.          Total Time in preparing paper work, data evaluation and todays exam - 60 minutes  Dustin Flock M.D on 04/20/2018 at 2:02 [place]  [phone]

## 2018-04-20 NOTE — Progress Notes (Signed)
[place], [place] 04/20/18  Subjective:  Patient seen at bedside. Overall feeling well. Patient made aware of colon cancer diagnosis.   Objective:  Vital signs in last 24 hours:  Temp:  [98.1 F (36.7 C)-99.5 F (37.5 C)] 98.8 F (37.1 C) (10/05 0800) Pulse Rate:  [71-89] 83 (10/05 0800) Resp:  [15-23] 18 (10/04 2323) BP: (152-194)/(64-82) 166/81 (10/05 0800) SpO2:  [90 %-100 %] 98 % (10/05 0800) Weight:  [94.3 kg-95.1 kg] 94.3 kg (10/04 1600)  Weight change: -0.019 kg Filed Weights   04/19/18 0743 04/19/18 1300 04/19/18 1600  Weight: 95.1 kg 95.1 kg 94.3 kg    Intake/Output:    Intake/Output Summary (Last 24 hours) at 04/20/2018 1238 Last data filed at 04/20/2018 0900 Gross per 24 hour  Intake 410 ml  Output 510 ml  Net -100 ml     Physical Exam: General:  No acute distress  HEENT  anicteric, moist oral mucous membranes  Neck  supple  Pulm/lungs  normal breathing effort, room air, CTAB  CVS/Heart  regular  Abdomen:   Nontender, BS present  Extremities:  trace peripheral edema  Neurologic:  Alert, oriented, follows commands  Skin:  No acute rashes  Access:  Left forearm AV fistula       Basic Metabolic Panel:  Recent Labs  Lab 04/15/18 1614 04/16/18 0319 04/19/18 1230  NA  --  138  --   K  --  3.8  --   CL  --  97*  --   CO2  --  32  --   GLUCOSE  --  108*  --   BUN  --  26*  --   CREATININE  --  6.59*  --   CALCIUM  --  8.0*  --   PHOS 5.5*  --  5.4*     CBC: Recent Labs  Lab 04/14/18 0457 04/15/18 1614 04/16/18 0319 04/18/18 1356  WBC 17.4* 14.6* 12.2* 12.2*  HGB 10.7* 10.3* 10.2* 10.0*  HCT 29.9* 31.0* 29.4* 28.7*  MCV 91.1 93.4 93.3 93.7  PLT 112* 130* 113* 161     No results found for: HEPBSAG, HEPBSAB, HEPBIGM    Microbiology:  Recent Results (from the past 240 hour(s))  Culture, blood (Routine x 2)     Status: Abnormal   Collection Time: 04/12/18  6:37 AM  Result Value Ref Range Status    Specimen Description   Final    BLOOD RIGHT ARM Performed at [facility], [address] [place], [place] [address]    Special Requests   Final    BOTTLES DRAWN AEROBIC AND ANAEROBIC Blood Culture adequate volume Performed at [facility], [address] [place], [place] [address]    Culture  Setup Time   Final    GRAM POSITIVE RODS ANAEROBIC BOTTLE ONLY CRITICAL RESULT CALLED TO, READ BACK BY AND VERIFIED WITH: DAVID BESANTI AT 2358 04/12/18.PMH Performed at [facility], [address] [place], [place] [address]    Culture CLOSTRIDIUM SPECIES (A)  Final   Report Status 04/16/2018 FINAL  Final  Culture, blood (Routine x 2)     Status: Abnormal   Collection Time: 04/12/18  6:39 AM  Result Value Ref Range Status   Specimen Description   Final    BLOOD RIGHT HAND Performed at [facility], [address] [place], [place] [address]    Special Requests   Final    BOTTLES DRAWN AEROBIC AND ANAEROBIC Blood  Culture adequate volume Performed at [facility], [address] [place], [place] [address]    Culture  Setup Time   Final    ANAEROBIC BOTTLE ONLY GRAM POSITIVE RODS CRITICAL VALUE NOTED.  VALUE IS CONSISTENT WITH PREVIOUSLY REPORTED AND CALLED VALUE. Performed at [facility], [address] [place], [place] [address]    Culture CLOSTRIDIUM SEPTICUM (A)  Final   Report Status 04/16/2018 FINAL  Final  Gastrointestinal Panel by PCR , Stool     Status: Abnormal   Collection Time: 04/12/18  7:02 AM  Result Value Ref Range Status   Campylobacter species NOT DETECTED NOT DETECTED Final   Plesimonas shigelloides NOT DETECTED NOT DETECTED Final   Salmonella species NOT DETECTED NOT DETECTED Final   Yersinia enterocolitica NOT DETECTED NOT DETECTED Final   Vibrio species NOT DETECTED NOT DETECTED Final   Vibrio cholerae NOT DETECTED NOT DETECTED Final   Enteroaggregative E coli (EAEC) NOT DETECTED NOT DETECTED Final    Enteropathogenic E coli (EPEC) DETECTED (A) NOT DETECTED Final    Comment: RESULT CALLED TO, READ BACK BY AND VERIFIED WITH: MATTHEW WRIGHT 04/12/18 @ 1751  MLK    Enterotoxigenic E coli (ETEC) NOT DETECTED NOT DETECTED Final   Shiga like toxin producing E coli (STEC) NOT DETECTED NOT DETECTED Final   Shigella/Enteroinvasive E coli (EIEC) NOT DETECTED NOT DETECTED Final   Cryptosporidium NOT DETECTED NOT DETECTED Final   Cyclospora cayetanensis NOT DETECTED NOT DETECTED Final   Entamoeba histolytica NOT DETECTED NOT DETECTED Final   Giardia lamblia NOT DETECTED NOT DETECTED Final   Adenovirus F40/41 NOT DETECTED NOT DETECTED Final   Astrovirus NOT DETECTED NOT DETECTED Final   Norovirus GI/GII NOT DETECTED NOT DETECTED Final   Rotavirus A NOT DETECTED NOT DETECTED Final   Sapovirus (I, II, IV, and V) NOT DETECTED NOT DETECTED Final    Comment: Performed at [facility], [address] [place], [place] [address]  C difficile quick scan w PCR reflex     Status: Abnormal   Collection Time: 04/12/18  7:02 AM  Result Value Ref Range Status   C Diff antigen POSITIVE (A) NEGATIVE Final   C Diff toxin NEGATIVE NEGATIVE Final   C Diff interpretation Results are indeterminate. See PCR results.  Final    Comment: Performed at [facility], [address] [place], [place] [address]  C. Diff by PCR, Reflexed     Status: None   Collection Time: 04/12/18  7:02 AM  Result Value Ref Range Status   Toxigenic C. Difficile by PCR NEGATIVE NEGATIVE Final    Comment: Patient is colonized with non toxigenic C. difficile. May not need treatment unless significant symptoms are present. Performed at [facility], [address] [place], [place] [address]   MRSA PCR Screening     Status: None   Collection Time: 04/12/18  7:00 PM  Result Value Ref Range Status   MRSA by PCR NEGATIVE NEGATIVE Final    Comment:        The GeneXpert MRSA Assay (FDA approved for NASAL  specimens only), is one component of a comprehensive MRSA colonization surveillance program. It is not intended to diagnose MRSA infection nor to guide or monitor treatment for MRSA infections. Performed at [facility], [address] [place], [place] [address]   Culture, blood (Routine X 2) w Reflex to ID Panel     Status: None (Preliminary result)   Collection Time: 04/17/18 12:53 AM  Result Value Ref Range Status   Specimen Description BLOOD BLOOD RIGHT ARM  Final   Special Requests   Final    BOTTLES DRAWN AEROBIC AND ANAEROBIC Blood Culture adequate volume   Culture   Final    NO GROWTH 3 DAYS Performed at [facility], [address], [place], [place] [address]    Report Status PENDING  Incomplete  Culture, blood (Routine X 2) w Reflex to ID Panel     Status: None (Preliminary result)   Collection Time: 04/17/18 12:59 AM  Result Value Ref Range Status   Specimen Description BLOOD BLOOD RIGHT WRIST  Final   Special Requests   Final    BOTTLES DRAWN AEROBIC AND ANAEROBIC Blood Culture adequate volume   Culture   Final    NO GROWTH 3 DAYS Performed at [facility], [address] [place], [place] [address]    Report Status PENDING  Incomplete    Coagulation Studies: No results for input(s): LABPROT, INR in the last 72 hours.  Urinalysis: No results for input(s): COLORURINE, LABSPEC, PHURINE, GLUCOSEU, HGBUR, BILIRUBINUR, KETONESUR, PROTEINUR, UROBILINOGEN, NITRITE, LEUKOCYTESUR in the last 72 hours.  Invalid input(s): APPERANCEUR    Imaging: Ct Chest W Contrast  Result Date: 04/18/2018 CLINICAL DATA:  Recent diagnosis of colon cancer.  Staging exam. EXAM: CT CHEST WITH CONTRAST TECHNIQUE: Multidetector CT imaging of the chest was performed during intravenous contrast administration. CONTRAST:  75 mL OMNIPAQUE IOHEXOL 300 MG/ML  SOLN COMPARISON:  PA and lateral chest 04/12/2018 and 08/16/2016. FINDINGS: Cardiovascular: Heart size is  normal. No pericardial effusion. Scattered calcific aortic and coronary atherosclerotic calcifications are identified. Calcification of the aortic valve is also noted. Mediastinum/Nodes: No enlarged mediastinal, hilar, or axillary lymph nodes. Thyroid gland, trachea, and esophagus demonstrate no significant findings. Small to moderate hiatal hernia is identified. Lungs/Pleura: No pulmonary nodule, mass or consolidative process is identified. Minimal dependent atelectasis is noted. Trace left pleural effusion is seen. Upper Abdomen: Contrast material in the gallbladder consistent with vicarious excretion is noted. No acute abnormality is identified that cyst in the upper pole of the left kidney is unchanged. No acute abnormality is identified. Musculoskeletal: No lytic or sclerotic lesion is identified. Bilateral gynecomastia is seen. IMPRESSION: Negative for metastatic disease. Very small left pleural effusion. Small to moderate hiatal hernia. Calcific coronary artery disease. Gynecomastia. Aortic Atherosclerosis (ICD10-I70.0). Electronically Signed   By: Inge Rise M.D.   On: 04/18/2018 14:44   [place] Ekg Site Rite  Result Date: 04/18/2018 If Site Rite image not attached, placement could not be confirmed due to current cardiac rhythm.    Medications:   . sodium chloride 10 mL/hr at 04/19/18 0753  . ampicillin-sulbactam (UNASYN) IV 3 g (04/20/18 0125)  . famotidine (PEPCID) IV Stopped (04/18/18 1539)   . amLODipine  5 mg Oral Daily  . Chlorhexidine Gluconate Cloth  6 each Topical Q0600  . Chlorhexidine Gluconate Cloth  6 each Topical Once  . multivitamin  1 tablet Oral Daily  . sodium chloride flush  3 mL Intravenous Q12H   sodium chloride, acetaminophen **OR** acetaminophen, bisacodyl, calcium carbonate, hydrALAZINE, HYDROcodone-acetaminophen, ondansetron **OR** ondansetron (ZOFRAN) IV, sodium chloride flush  Assessment/ Plan:  77 y.o. male  ESRD, hypertension, diabetes mellitus, GERD,  anemia of CKD, SHPTH, transitioned off PD to HD 6/16, colonic mass 10/19  [facility]/MWF  1.  End-stage renal disease -Patient had hemodialysis yesterday.  No acute indication for dialysis today.  We will plan for dialysis again on Monday  as an outpatient.  2.  Anemia of CKD -Hold off any further Epogen use given underlying colon cancer.  3.  Secondary hyperparathyroidism -Continue to monitor bone mineral metabolism parameters as an outpatient.  4. Acute Gastroenteritis -C. difficile antigen positive but toxin negative. Stool positive for enteropathogenic E. coli Treated with Cipro  5.  Colon cancer.  Found to have an ascending colon mass which was found to be colon cancer.  Well differentiated on biopsy.  He will need surgical evaluation as an outpatient.   LOS: 8 [place] 10/5/201912:38 PM  [place], [place]  Note: This note was prepared with Dragon dictation. Any transcription errors are unintentional

## 2018-04-20 NOTE — Progress Notes (Signed)
Patient discharging home. IV removed. Patient and son given discharge instructions and prescriptions, verbalized understanding. Son provided transportation home.

## 2018-04-20 NOTE — Care Management (Signed)
RNCM received call from MD wanting to discharge patient to home. Home IV antibiotics have not been arranged.  I spoke with Brenton Grills with [facility] and he feels that patient's needs will be met and no plans for discharge delay. MD updated.

## 2018-04-20 NOTE — Plan of Care (Signed)

## 2018-04-22 ENCOUNTER — Encounter: Payer: Self-pay | Admitting: Vascular Surgery

## 2018-04-22 ENCOUNTER — Telehealth: Payer: Self-pay | Admitting: Licensed Clinical Social Worker

## 2018-04-22 ENCOUNTER — Telehealth: Payer: Self-pay | Admitting: Oncology

## 2018-04-22 LAB — CULTURE, BLOOD (ROUTINE X 2)
Culture: NO GROWTH
Culture: NO GROWTH
Special Requests: ADEQUATE
Special Requests: ADEQUATE

## 2018-04-22 LAB — SURGICAL PATHOLOGY

## 2018-04-22 NOTE — Telephone Encounter (Signed)
Coretta from [place] stating that ampicillin-sulbactam 3 grams needed prior authorization. I called [employer] and started prior auth., status is urgent. Ref# is [id number]

## 2018-04-22 NOTE — Telephone Encounter (Signed)
See MD on 05/10/18 for Jonathan Mcgrath f/u COLON MASS, per Dr. Jackelyn Knife Chat msg. (No Labs)  Appt conf w patient. Appt also mailed.

## 2018-04-23 LAB — COMP PANEL: LEUKEMIA/LYMPHOMA: Immunophenotypic Profile: 54

## 2018-04-23 NOTE — Telephone Encounter (Signed)
[employer] approved medication ampicillin-sulbactam 3 grams. Approved until 06/2019. Notified Coretta at [place]

## 2018-04-24 ENCOUNTER — Other Ambulatory Visit: Payer: Self-pay | Admitting: Infectious Diseases

## 2018-04-24 ENCOUNTER — Encounter: Payer: Self-pay | Admitting: Gastroenterology

## 2018-04-24 ENCOUNTER — Telehealth: Payer: Self-pay | Admitting: Licensed Clinical Social Worker

## 2018-04-24 ENCOUNTER — Other Ambulatory Visit: Payer: Self-pay | Admitting: Licensed Clinical Social Worker

## 2018-04-24 MED ORDER — METRONIDAZOLE 500 MG PO TABS: 500.0000 mg | ORAL_TABLET | Freq: Three times a day (TID) | ORAL | 0 refills | Status: DC

## 2018-04-24 MED ORDER — METRONIDAZOLE 500 MG PO TABS: 500.0000 mg | ORAL_TABLET | Freq: Three times a day (TID) | ORAL | 0 refills | Status: AC

## 2018-04-24 NOTE — Telephone Encounter (Signed)
Thanks Tamika Sent  A message to [facility]-

## 2018-04-24 NOTE — Anesthesia Postprocedure Evaluation (Signed)
Anesthesia Post Note  [name]  Procedure(s) Performed: ESOPHAGOGASTRODUODENOSCOPY (EGD) (N/A ) COLONOSCOPY (N/A )  Patient location during evaluation: Endoscopy Anesthesia Type: General Level of consciousness: awake and alert Pain management: pain level controlled Vital Signs Assessment: post-procedure vital signs reviewed and stable Respiratory status: spontaneous breathing, nonlabored ventilation and respiratory function stable Cardiovascular status: blood pressure returned to baseline and stable Postop Assessment: no apparent nausea or vomiting Anesthetic complications: no     Last Vitals:  Vitals:   04/19/18 2323 04/20/18 0800  BP: (!) 152/68 (!) 166/81  Pulse: 87 83  Resp: 18   Temp: 37.5 C 37.1 C  SpO2: 96% 98%    Last Pain:  Vitals:   04/20/18 0800  TempSrc: Oral  PainSc:                  Alphonsus Sias

## 2018-04-24 NOTE — Telephone Encounter (Signed)
Patricia,RN nurse with [facility] called stating that patient is unable to properly give himself IV ABX, and his family works during the day. Patient goes to dialysis MWF and arrives home after son is already gone to work for the morning. Dr. Delaine Lame is willing to change him to Vancomycin 1 gram on MWF for dialysis to administer. Called [facility] in [place], and they have to get the ok from Dr, Holley Raring the patient's Nephrologist. I called and left a message with Dr. Elwyn Lade nurse. Waiting a return call. I will route this message to Dr. Rocky Morel for orders.

## 2018-04-24 NOTE — Telephone Encounter (Signed)
Spoke with daughter [name] she will make sure the patient gets the dose of Ampicilin-sulbactam this evening and BID tomorrow. Friday he will start Vanc at dialysis along with Flagyl 3 times daily. I was asked to call flagyl in to [facility].

## 2018-04-25 ENCOUNTER — Telehealth: Payer: Self-pay | Admitting: Gastroenterology

## 2018-04-25 NOTE — Telephone Encounter (Signed)
-----   Message from Virgel Manifold, MD sent at 04/24/2018  5:10 PM EDT -----  Please set up clinic appointment with me in 3 to 4 months

## 2018-04-25 NOTE — Telephone Encounter (Signed)
RE CALL LETTER CREATED FOR APT

## 2018-04-30 ENCOUNTER — Other Ambulatory Visit: Payer: Self-pay | Admitting: *Deleted

## 2018-04-30 NOTE — Progress Notes (Signed)
Error

## 2018-05-01 ENCOUNTER — Telehealth: Payer: Self-pay | Admitting: *Deleted

## 2018-05-01 NOTE — Telephone Encounter (Signed)
Called patient several times and no answer and voicemail full on the cell phone. I tried back later today and he answered and said that he got a call from [place] office and said that his appt was 10:30 on this Friday but he gets done with dialysis at 10 am and felt like he could not get there in time unless it was 11 am. The office was agreeable and he will be there. He was given the location from the surgeon's office. I told him that we will keep an eye out for when he has surgery and we will make him new appt based on surgery date. He is ok with that and I need to keep in mind that pt has dialysis mon, wed, and Friday.

## 2018-05-02 ENCOUNTER — Telehealth: Payer: Self-pay

## 2018-05-02 NOTE — Telephone Encounter (Signed)
-----   Message from Virgel Manifold, MD sent at 04/24/2018  5:10 PM EDT ----- Please refer patient to genetic counselor who comes to the cancer center here, due to multiple polyps

## 2018-05-03 DIAGNOSIS — C182 Malignant neoplasm of ascending colon: Secondary | ICD-10-CM | POA: Insufficient documentation

## 2018-05-03 DIAGNOSIS — D126 Benign neoplasm of colon, unspecified: Secondary | ICD-10-CM | POA: Insufficient documentation

## 2018-05-07 ENCOUNTER — Encounter: Payer: Self-pay | Admitting: Infectious Diseases

## 2018-05-07 ENCOUNTER — Inpatient Hospital Stay: Payer: Medicare HMO | Admitting: Infectious Diseases

## 2018-05-07 ENCOUNTER — Ambulatory Visit: Payer: Medicare HMO | Attending: Infectious Diseases | Admitting: Infectious Diseases

## 2018-05-07 ENCOUNTER — Other Ambulatory Visit: Payer: Self-pay | Admitting: Licensed Clinical Social Worker

## 2018-05-07 VITALS — BP 172/72 | HR 77 | Temp 97.7°F | Wt 207.6 lb

## 2018-05-07 DIAGNOSIS — N186 End stage renal disease: Secondary | ICD-10-CM | POA: Diagnosis not present

## 2018-05-07 DIAGNOSIS — Z86010 Personal history of colonic polyps: Secondary | ICD-10-CM | POA: Insufficient documentation

## 2018-05-07 DIAGNOSIS — Z992 Dependence on renal dialysis: Secondary | ICD-10-CM | POA: Diagnosis not present

## 2018-05-07 DIAGNOSIS — Z9889 Other specified postprocedural states: Secondary | ICD-10-CM | POA: Insufficient documentation

## 2018-05-07 DIAGNOSIS — Z79899 Other long term (current) drug therapy: Secondary | ICD-10-CM | POA: Diagnosis not present

## 2018-05-07 DIAGNOSIS — C180 Malignant neoplasm of cecum: Secondary | ICD-10-CM | POA: Insufficient documentation

## 2018-05-07 DIAGNOSIS — R197 Diarrhea, unspecified: Secondary | ICD-10-CM | POA: Insufficient documentation

## 2018-05-07 DIAGNOSIS — Z8249 Family history of ischemic heart disease and other diseases of the circulatory system: Secondary | ICD-10-CM | POA: Insufficient documentation

## 2018-05-07 DIAGNOSIS — K635 Polyp of colon: Secondary | ICD-10-CM | POA: Diagnosis not present

## 2018-05-07 DIAGNOSIS — A480 Gas gangrene: Secondary | ICD-10-CM

## 2018-05-07 DIAGNOSIS — Z452 Encounter for adjustment and management of vascular access device: Secondary | ICD-10-CM | POA: Insufficient documentation

## 2018-05-07 DIAGNOSIS — R011 Cardiac murmur, unspecified: Secondary | ICD-10-CM

## 2018-05-07 DIAGNOSIS — I776 Arteritis, unspecified: Secondary | ICD-10-CM

## 2018-05-07 DIAGNOSIS — B967 Clostridium perfringens [C. perfringens] as the cause of diseases classified elsewhere: Secondary | ICD-10-CM

## 2018-05-07 DIAGNOSIS — I120 Hypertensive chronic kidney disease with stage 5 chronic kidney disease or end stage renal disease: Secondary | ICD-10-CM | POA: Insufficient documentation

## 2018-05-07 DIAGNOSIS — R7881 Bacteremia: Secondary | ICD-10-CM | POA: Insufficient documentation

## 2018-05-07 DIAGNOSIS — Z833 Family history of diabetes mellitus: Secondary | ICD-10-CM | POA: Diagnosis not present

## 2018-05-07 MED ORDER — METRONIDAZOLE 500 MG PO TABS: 500.0000 mg | ORAL_TABLET | Freq: Three times a day (TID) | ORAL | 1 refills | Status: DC

## 2018-05-07 NOTE — Patient Instructions (Signed)
You are here for follow up of recent blood infection. Clostridium septicum blood infection- you also had some inflammation of the aorta and also was diagnosed with colon cancer. You have been on antibiotics since 04/12/18. Initially unasyn and then vancomycin as the former could not be given on q12 basis at home. You are going for surgery on 10/31. I Will discuss with Dr.Sakai and get a CT angio of the abdomen to look for improvement in the aorta inflammation. Continue taking vancomycin during dialysis and oral flagyl as prescribed- you may need for atleast 8-weeks or more.

## 2018-05-07 NOTE — Telephone Encounter (Signed)
Error no refill

## 2018-05-07 NOTE — Progress Notes (Signed)
NAME: Jonathan Mcgrath  DOB: [birth year]  MRN: [id number]  Date/Time: 05/07/2018 9:52 AM Subjective:  Follow-up visit for recent hospitalization Here with his son ? Jonathan Mcgrath is a 77 y.o. male with a history of end-stage renal disease on dialysis, hypertension was recently in [facility] with diarrhea and vomiting and was diagnosed with clostridium septicum bacteremia which led to colonoscopy on 04/18/2018 which showed multiple polyps and a colon mass in the cecum.  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  Patient was initially treated with IV Unasyn and was discharged home on Unasyn.  But unfortunately because of twice a day dose and his son not able to give him the morning doses because of work and dialysis schedule Unasyn was not continued and he was changed to vancomycin that he is getting during dialysis and p.o. Flagyl.  Patient saw  Dr. [name] on 05/03/2018 and is scheduled to undergo laparoscopic colon resection on 05/16/2018.  Patient has been doing well.  He does not have any diarrhea .  Some nausea secondary to Flagyl.  No abdominal pain.  No fever or chills.  Past Medical History:  Diagnosis Date  . Chronic kidney disease   . Hemodialysis access site with arteriovenous graft ([place])   . Hypertension   . Renal insufficiency     Past Surgical History:  Procedure Laterality Date  . AV FISTULA PLACEMENT Left 2015   arm  . CENTRAL LINE INSERTION-TUNNELED N/A 04/19/2018   Procedure: CENTRAL LINE INSERTION-TUNNELED;  Surgeon: Delana Meyer Dolores Lory, MD;  Location: [facility];  Service: Cardiovascular;  Laterality: N/A;  . COLONOSCOPY N/A 04/18/2018   Procedure: COLONOSCOPY;  Surgeon: Virgel Manifold, MD;  Location: [facility] ENDOSCOPY;  Service: Endoscopy;  Laterality: N/A;  . ESOPHAGOGASTRODUODENOSCOPY N/A 04/18/2018   Procedure: ESOPHAGOGASTRODUODENOSCOPY (EGD);  Surgeon: Virgel Manifold, MD;  Location: [facility] ENDOSCOPY;  Service: Endoscopy;  Laterality: N/A;  . PERIPHERAL VASCULAR CATHETERIZATION Left 01/12/2016   Procedure: A/V Shuntogram/Fistulagram;  Surgeon: Algernon Huxley, MD;  Location: [facility];  Service: Cardiovascular;  Laterality: Left;  . PERIPHERAL VASCULAR CATHETERIZATION N/A 01/12/2016   Procedure: A/V Shunt Intervention;  Surgeon: Algernon Huxley, MD;  Location: [facility];  Service: Cardiovascular;  Laterality: N/A;  . PERITONEAL CATHETER INSERTION N/A   . REMOVAL OF A DIALYSIS CATHETER N/A 01/14/2015   Procedure: REMOVAL OF A  PERITONEAL DIALYSIS CATHETER;  Surgeon: Algernon Huxley, MD;  Location: [facility] ORS;  Service: Vascular;  Laterality: N/A;  . TEE WITHOUT CARDIOVERSION N/A 04/19/2018   Procedure: TRANSESOPHAGEAL ECHOCARDIOGRAM (TEE);  Surgeon: Teodoro Spray, MD;  Location: [facility] ORS;  Service: Cardiovascular;  Laterality: N/A;    Social history Lives with his son Non-smoker Family History  Problem Relation Age of Onset  . Diabetes Mother   . Heart disease Father    No Known Allergies ? Current Outpatient Medications  Medication Sig Dispense Refill  . amLODipine (NORVASC) 5 MG tablet Take 1 tablet (5 mg total) by mouth daily. 30 tablet 0  . Ampicillin-Sulbactam 3 g in sodium chloride 0.9 % 100 mL Inject 3 g into the vein every 12 (twelve) hours for 22 days. 44 Bag 0  . cinacalcet (SENSIPAR) 30 MG tablet Take 30 mg by mouth every morning.    . ferric citrate (AURYXIA) 1 GM 210 MG(Fe) tablet Take 1 tablet by mouth daily.    Marland Kitchen  multivitamin (RENA-VIT) TABS tablet Take 1 tablet by mouth daily.  11   No current facility-administered medications for this visit.      Abtx:  Anti-infectives (From admission, onward)   None      REVIEW OF SYSTEMS:  Const: negative fever, negative chills, negative weight loss Eyes: negative diplopia or visual changes, negative eye pain ENT: negative coryza, negative sore throat Resp: negative cough,  hemoptysis, dyspnea Cards: negative for chest pain, palpitations, lower extremity edema GU: negative for frequency, dysuria and hematuria GI: Negative for abdominal apin, diarrhea, bleeding, constipation Skin: negative for rash and pruritus Heme: negative for easy bruising and gum/nose bleeding MS: negative for myalgias, arthralgias, back pain and muscle weakness Neurolo:negative for headaches, dizziness, vertigo, memory problems  Psych: negative for feelings of anxiety, depression  Endocrine: negative for thyroid, diabetes Allergy/Immunology- negative for any medication or food allergies ?  Objective:  VITALS:  BP (!) 172/72 (BP Location: Right Arm, Patient Position: Sitting, Cuff Size: Large)   Pulse 77   Temp 97.7 F (36.5 C) (Oral)   Wt 207 lb 9.6 Jonathan (94.2 kg)   BMI 27.39 kg/m  PHYSICAL EXAM:  General: Alert, cooperative, no distress, appears stated age.  Looks well Head: Normocephalic, without obvious abnormality, atraumatic. Eyes: Conjunctivae clear, anicteric sclerae. Pupils are equal ENT Nares normal. No drainage or sinus tenderness. Lips, mucosa, and tongue normal. No Thrush Neck: Supple, symmetrical, no adenopathy, thyroid: non tender no carotid bruit and no JVD. Back: No CVA tenderness. Lungs: Clear to auscultation bilaterally. No Wheezing or Rhonchi. No rales. Heart: Regular rate and rhythm, systolic murmur Chest wall he has a Hickman catheter.  abdomen: Soft, non-tender,not distended. Bowel sounds normal. No masses Extremities: atraumatic, no cyanosis. No edema. No clubbing Skin: No rashes or lesions. Or bruising Lymph: Cervical, supraclavicular normal. Neurologic: Grossly non-focal Pertinent Labs None available IMAGING RESULTS: ?None Impression/Recommendation  77 y.o. male with a history of end-stage renal disease on dialysis, hypertension was recently in [facility] with diarrhea and vomiting and was diagnosed with clostridium septicum bacteremia which led to  colonoscopy on 04/18/2018 which showed multiple polyps and a colon mass in the cecum.  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 ? ?  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  Because of aortitis he is getting 6 to 8 weeks of IV antibiotics.  Initially was on Unasyn.  But because of his dialysis schedule and also his son's work schedule unable to give it at home on a 12 hourly basis.  So he is  now getting vancomycin IV at the dialysis center on the days of dialysis and p.o. Flagyl every 8 hours.  He will get it for another 6 to 8 weeks.  We will repeat a CT Angio of his abdomen to look at the aorta and the iliac artery to see the inflammation is gotten better.   Clearly has a Hickman catheter which was placed for IV Unasyn at home.  But is not being used now because of the vancomycin at the dialysis center.  Will wait till he sees oncology and then after surgery and then decide whether to remove it  Colon cancer.  Adenocarcinoma of the cecum.  Awaiting laparoscopic colon resection on 05/16/2018.  End-stage renal disease on dialysis ? ___________________________________________________ Discussed with patient And his son in great detail.  Will discuss with Dr. [name] and Dr. Holley Raring

## 2018-05-09 ENCOUNTER — Ambulatory Visit: Payer: Self-pay | Admitting: Surgery

## 2018-05-09 ENCOUNTER — Other Ambulatory Visit: Payer: Self-pay | Admitting: Surgery

## 2018-05-09 DIAGNOSIS — C182 Malignant neoplasm of ascending colon: Secondary | ICD-10-CM

## 2018-05-09 NOTE — H&P (View-Only) (Signed)
Subjective:   CC: Malignant neoplasm of ascending colon (CMS-HCC) [C18.2]  HPI:  Jonathan Mcgrath is a 77 y.o. male who was referred by [facility]* for evaluation of above.  Here today to discuss surgical options.  Asymptomatic except for some diarrhea, denies melena, hematochizia.  Still receiving abx during HD for bacteremia diagnosed during previous hospital stay.   Past Medical History:  has a past medical history of Cataract cortical, senile, Diabetes mellitus type 2, uncomplicated (CMS-HCC), Dialysis patient (CMS-HCC) (Nov. 2014), Kidney disease, and Renal insufficiency.  Past Surgical History:  has a past surgical history that includes port for dialysis left arm Jan 2015; Tonsillectomy; extraction cataract extracapsular w/insertion intraocular prosthesis (Left, 08/21/2017); and extraction cataract extracapsular w/insertion intraocular prosthesis (Right, 09/11/2017). PICC placement during most recent hospitalization.  PD cath placement years ago.  Family History: family history includes Cancer in his sister; Diabetes in his mother; Lung cancer in his father.  Son has hx of multiple adenomas.  Social History:  reports that he has never smoked. He has never used smokeless tobacco. He reports that he does not drink alcohol or use drugs.  Current Medications: has a current medication list which includes the following prescription(s): ferric citrate, multivitamin, rena-vite rx, sensipar, sevelamer carbonate, bromfenac, ketorolac, and oseltamivir.  Allergies:  No Known Allergies  ROS:  A 15 point review of systems was performed and pertinent positives and negatives noted in HPI   Objective:   BP 146/64   Pulse 77   Temp 36.1 C (97 F) (Oral)   Ht 185.4 cm (6\' 1" )   Wt 92.4 kg (203 lb 9.6 oz)   BMI 26.86 kg/m   Constitutional :  alert, appears stated age, cooperative and no distress  Lymphatics/Throat:  no asymmetry, masses, or scars  Respiratory:  clear  to auscultation bilaterally  Cardiovascular:  regular rate and rhythm and murmur present  Gastrointestinal: soft, non-tender; bowel sounds normal; no masses,  no organomegaly.    Musculoskeletal: Steady gait and movement  Skin: Cool and moist, PICC in place, right chest  Psychiatric: Normal affect, non-agitated, not confused       LABS:  CEA- 4.7  SURGICAL PATHOLOGY Surgical Pathology CASE: [phone] [name] Surgical Pathology Report     SPECIMEN SUBMITTED: A. Stomach nodule; cbx B. Colon mass, cecum; cbx C. Colon polyp x2, ascending; hot snare D. Colon polyp, ascending; hot snare E. Colon, hepatic flexure, thickened fold; cbx F. Colon polyp x9, transverse; hot and cold snare G. Colon polyp, sigmoid; hot snare  CLINICAL HISTORY: None provided  PRE-OPERATIVE DIAGNOSIS: Clostridium sepsis, r/o malignancy  POST-OPERATIVE DIAGNOSIS: Gastric nodule, colon mass, colon polyps     DIAGNOSIS:  A.STOMACH NODULE; COLD BIOPSY: - HYPERPLASTIC (FOVEOLAR GLAND) POLYP. - NEGATIVE FOR DYSPLASIA AND MALIGNANCY.  B.COLON MASS, CECUM; COLD BIOPSIES: - INVASIVE WELL DIFFERENTIATED ADENOCARCINOMA. - SEE COMMENT.  C.COLON POLYP X2, ASCENDING; HOT SNARE: -TUBULAR ADENOMA (MULTIPLE FRAGMENTS). - NEGATIVE FOR HIGH-GRADE DYSPLASIA AND MALIGNANCY. - SEE COMMENT.  D.COLON POLYP, ASCENDING; HOT SNARE: -TUBULOVILLOUS ADENOMA (MULTIPLE FRAGMENTS). - NEGATIVE FOR HIGH-GRADE DYSPLASIA AND MALIGNANCY.  E.COLON, HEPATIC FLEXURE, THICKENED FOLD; COLD BIOPSY: -BENIGN COLONIC MUCOSA. -NEGATIVE FOR DYSPLASIA AND MALIGNANCY.  F.COLON POLYP X9, TRANSVERSE; HOT AND COLD SNARE: -TUBULAR ADENOMA (MULTIPLE FRAGMENTS). - NEGATIVE FOR HIGH-GRADE DYSPLASIA AND MALIGNANCY.  G.COLON POLYP, SIGMOID; HOT SNARE: - TUBULOVILLOUS ADENOMA (MULTIPLE FRAGMENTS). - NEGATIVE FOR HIGH-GRADE DYSPLASIA AND MALIGNANCY.  COMMENT:Specimen C contains fragments of inflamed  adenomatous mucosa which may represent superficial portions of Specimen B (see  Colonoscopy Operative Note).  Specimen B was reviewed in intradepartmental consultation.  Dr. Bonna Gains was notified 04/19/2018 at 12:48 PM.   GROSS DESCRIPTION: A. Labeled: Gastric nodule cbx Received: In formalin Tissue fragment(s): 1 Size: 0.3 cm Description: Red-tan fragment Entirely submitted in one cassette.  B. Labeled: Cecal mass cbx Received: In formalin Tissue fragment(s): Multiple Size: Aggregate, 1.0 x 0.4 x 0.1 cm Description: Tan-brown fragments Entirely submitted in one cassette.  C. Labeled: Ascending colon polyp hot snare x2 Received: In formalin Tissue fragment(s): Multiple Size: Aggregate, 1.8 x 0.8 x 0.1 cm Description: Pink-tan fragments (80%) and green fecal material Entirely submitted in one cassette.  D. Labeled: Ascending colon polyp hot snare Received: In formalin Tissue fragment(s): Multiple Size: Aggregate, 2.0 x 0.7 x 0.1 cm Description: Tan tissue fragments (90%) and yellow fecal material Entirely submitted in one cassette.  E. Labeled: Thickened fold hepatic flexure cbx Received: In formalin Tissue fragment(s): 2 Size: 0.2 and 0.3 cm Description: Pink-tan fragments Entirely submitted in one cassette.  F. Labeled: Transverse colon polyp x9 hot snare and cold snare Received: In formalin Tissue fragment(s): Multiple Size: Aggregate, 1.9 x 0.9 x 0.1 cm Description: Pink-tan fragments with a minimal amount of fecal material Entirely submitted in one cassette.  G. Labeled: Sigmoid colon polyp hot snare Received: In formalin Tissue fragment(s): 4 Size: 0.3-0.6 cm Description: Pink-tan fragments Entirely submitted in one cassette.   Final Diagnosis performed by Galvin Proffer, MD. Electronically signed 04/19/2018 13:24:40NU The electronic signature indicates that the named Attending Pathologist has evaluated the specimen  Technical component performed at  [facility], [address], [place], [place] [address] Lab: [phone] Dir: Rush Farmer, MD, MMMProfessional component performed at [facility], [facility], [place], [place], [place] [address] Lab: [phone] Dir: Dellia Nims. Reuel Derby, MD     RADS: CLINICAL DATA:Recent diagnosis of colon cancer.Staging exam.  EXAM: CT CHEST WITH CONTRAST  TECHNIQUE: Multidetector CT imaging of the chest was performed during intravenous contrast administration.  CONTRAST:75 mL OMNIPAQUE IOHEXOL 300 MG/MLSOLN  COMPARISON:PA and lateral chest 04/12/2018 and 08/16/2016.  FINDINGS: Cardiovascular: Heart size is normal. No pericardial effusion. Scattered calcific aortic and coronary atherosclerotic calcifications are identified. Calcification of the aortic valve is also noted.  Mediastinum/Nodes: No enlarged mediastinal, hilar, or axillary lymph nodes. Thyroid gland, trachea, and esophagus demonstrate no significant findings. Small to moderate hiatal hernia is identified.  Lungs/Pleura: No pulmonary nodule, mass or consolidative process is identified. Minimal dependent atelectasis is noted. Trace left pleural effusion is seen.  Upper Abdomen: Contrast material in the gallbladder consistent with vicarious excretion is noted. No acute abnormality is identified that cyst in the upper pole of the left kidney is unchanged. No acute abnormality is identified.  Musculoskeletal: No lytic or sclerotic lesion is identified. Bilateral gynecomastia is seen.  IMPRESSION: Negative for metastatic disease.  Very small left pleural effusion.  Small to moderate hiatal hernia.  Calcific coronary artery disease.  Gynecomastia.  Aortic Atherosclerosis (ICD10-I70.0).   Electronically Signed ByAlphonse Guild M.D. On: 04/18/2018 14:44  Other Result Information  Interface, Rad Results In - 04/18/2018  2:47 PM EDT CLINICAL DATA:  Recent diagnosis of colon  cancer.  Staging exam.  EXAM: CT CHEST WITH CONTRAST  TECHNIQUE: Multidetector CT imaging of the chest was performed during intravenous contrast administration.  CONTRAST:  75 mL OMNIPAQUE IOHEXOL 300 MG/ML  SOLN  COMPARISON:  PA and lateral chest 04/12/2018 and 08/16/2016.  FINDINGS: Cardiovascular: Heart size is normal. No pericardial effusion. Scattered calcific aortic and coronary  atherosclerotic calcifications are identified. Calcification of the aortic valve is also noted.  Mediastinum/Nodes: No enlarged mediastinal, hilar, or axillary lymph nodes. Thyroid gland, trachea, and esophagus demonstrate no significant findings. Small to moderate hiatal hernia is identified.  Lungs/Pleura: No pulmonary nodule, mass or consolidative process is identified. Minimal dependent atelectasis is noted. Trace left pleural effusion is seen.  Upper Abdomen: Contrast material in the gallbladder consistent with vicarious excretion is noted. No acute abnormality is identified that cyst in the upper pole of the left kidney is unchanged. No acute abnormality is identified.  Musculoskeletal: No lytic or sclerotic lesion is identified. Bilateral gynecomastia is seen.  IMPRESSION: Negative for metastatic disease.  Very small left pleural effusion.  Small to moderate hiatal hernia.  Calcific coronary artery disease.  Gynecomastia.  Aortic Atherosclerosis (ICD10-I70.0).   Electronically Signed   By: Inge Rise M.D.   On: 04/18/2018 14:44   Result Narrative  CLINICAL DATA:77 year old male with nausea, vomiting, diarrhea and stool cultures positive for enteropathogenic E coli.  EXAM: CT ANGIOGRAPHY ABDOMEN AND PELVIS WITH CONTRAST AND WITHOUT CONTRAST  TECHNIQUE: Multidetector CT imaging of the abdomen and pelvis was performed using the standard protocol during bolus administration of intravenous contrast. Multiplanar reconstructed images and MIPs were obtained and  reviewed to evaluate the vascular anatomy.  CONTRAST:19mL ISOVUE-370 IOPAMIDOL (ISOVUE-370) INJECTION 76%  COMPARISON:CT scan of the abdomen and pelvis 04/12/2018  FINDINGS: VASCULAR  Aorta: Mild heterogeneous atherosclerotic plaque. No evidence of aneurysm or dissection.  Celiac: Patent without evidence of aneurysm, dissection, vasculitis or significant stenosis.  SMA: Patent without evidence of aneurysm, dissection, vasculitis or significant stenosis.  Renals: Both renal arteries are patent without evidence of aneurysm, dissection, vasculitis, fibromuscular dysplasia or significant stenosis.  IMA: Patent without evidence of aneurysm, dissection, vasculitis or significant stenosis.  Inflow: Circumferential arterial wall thickening beginning at the aortic bifurcation and extending along both common iliac arteries, particularly the left. On the right, the inflammatory changes. Prior to the bifurcation. On the left, the inflammatory changes extend beyond the bifurcation and along the external iliac artery for a short distance. No significant stenosis, dissection or aneurysm. There is mild atherosclerotic plaque and irregularity along the left common iliac artery. Moderate focal plaque along the right common iliac artery. The external iliac arteries are relatively spared from disease.  Proximal Outflow: Bilateral common femoral and visualized portions of the superficial and profunda femoral arteries are patent without evidence of aneurysm, dissection, vasculitis or significant stenosis.  Veins: Venous opacification of the IVC and iliac veins is relatively limited on the portal venous phase imaging. No definite thrombus or asymmetry identified. The hepatic, portal and renal veins are all widely patent as are the splanchnic veins.  Review of the MIP images confirms the above findings.  NON-VASCULAR  Lower chest: Incompletely imaged aortic valvular  calcifications. Cardiomegaly. No pericardial effusion. Trace left-sided pleural effusion and associated mild atelectasis. No pulmonary edema or focal airspace consolidation. Mild scarring in the inferior right middle lobe. Moderately large sliding hiatal hernia.  Hepatobiliary: Geographic hypoattenuation in the left hemi-liver adjacent to the fissure for the falciform ligament is nonspecific but most suggestive of benign focal fatty infiltration. Otherwise, no solid hepatic lesion. Subtle gallstones were better visualized on the recent noncontrast enhanced CT scan. No intra or extrahepatic biliary ductal dilatation.  Pancreas: Unremarkable. No pancreatic ductal dilatation or surrounding inflammatory changes.  Spleen: Normal in size without focal abnormality.  Adrenals/Urinary Tract: Normal adrenal glands. 2.3 cm nonenhancing simple cyst centrally in the upper  pole of the left kidney. Punctate nonobstructing right lower pole nephrolithiasis again noted. No evidence of hydronephrosis. The left ureter passes through the inflammatory changes overlying the left iliac vessels without evidence of external compression. The right ureter is unremarkable. The bladder is within normal limits.  Stomach/Bowel: No evidence of obstruction or focal bowel wall thickening. Normal appendix in the right lower quadrant. The terminal ileum is unremarkable.  Lymphatic: No suspicious lymphadenopathy.  Reproductive: Prostatomegaly.  Other: Small fat containing umbilical hernia. a No abdominopelvic ascites.  Musculoskeletal: No acute fracture or aggressive appearing lytic or blastic osseous lesion. Multilevel degenerative disc disease. There is a linear tract extending from the skin surface inferiorly over the left anterior abdominal wall. A small peripherally high density nodular region is present just superficial to the left rectus abdominus muscle. Given the configuration, this likely  represents chronic changes related to the presence of a prior peritoneal dialysis catheter.  IMPRESSION: VASCULAR  1. Circumferential arterial wall thickening beginning at the aortic bifurcation and extending along the left greater than right common iliac arteries. There are associated inflammatory changes with inflammatory stranding in the immediately adjacent retroperitoneal fat and left pelvic sidewall. 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 Recommend vascular surgery consultation if not already obtained. 2. No evidence of deep venous thrombosis at this time. 3. Aortic Atherosclerosis (ICD10-170.0). 4. Cardiomegaly. 5. Aortic valvular calcifications.  NON-VASCULAR  1. No acute nonvascular abnormality within the abdomen or pelvis. 2. Nonobstructing right lower pole nephrolithiasis. 3. Left upper pole simple renal cyst. 4. Trace left-sided pleural effusion. 5. Multilevel degenerative disc disease. 6. Small umbilical hernia. 7. Moderate hiatal hernia. 8. Additional ancillary findings as above.  Signed,  Criselda Peaches, MD, [place]  [facility]  [facility]   Electronically Signed By: HeathMcCullough M.D. On: 04/15/2018 09:55  Other Result Information  Interface, Rad Results In - 04/15/2018  9:57 AM EDT CLINICAL DATA:  77 year old male with nausea, vomiting, diarrhea and stool cultures positive for enteropathogenic E coli.  EXAM: CT ANGIOGRAPHY ABDOMEN AND PELVIS WITH CONTRAST AND WITHOUT CONTRAST  TECHNIQUE: Multidetector CT imaging of the abdomen and pelvis was performed using the standard protocol during bolus administration of intravenous contrast. Multiplanar reconstructed images and MIPs were obtained and reviewed to evaluate the vascular anatomy.  CONTRAST:  144mL ISOVUE-370 IOPAMIDOL (ISOVUE-370) INJECTION  76%  COMPARISON:  CT scan of the abdomen and pelvis 04/12/2018  FINDINGS: VASCULAR  Aorta: Mild heterogeneous atherosclerotic plaque. No evidence of aneurysm or dissection.  Celiac: Patent without evidence of aneurysm, dissection, vasculitis or significant stenosis.  SMA: Patent without evidence of aneurysm, dissection, vasculitis or significant stenosis.  Renals: Both renal arteries are patent without evidence of aneurysm, dissection, vasculitis, fibromuscular dysplasia or significant stenosis.  IMA: Patent without evidence of aneurysm, dissection, vasculitis or significant stenosis.  Inflow: Circumferential arterial wall thickening beginning at the aortic bifurcation and extending along both common iliac arteries, particularly the left. On the right, the inflammatory changes. Prior to the bifurcation. On the left, the inflammatory changes extend beyond the bifurcation and along the external iliac artery for a short distance. No significant stenosis, dissection or aneurysm. There is mild atherosclerotic plaque and irregularity along the left common iliac artery. Moderate focal plaque along the right common iliac artery. The external iliac arteries are relatively spared from disease.  Proximal Outflow: Bilateral common femoral and visualized portions of the superficial and profunda femoral arteries are patent without  evidence of aneurysm, dissection, vasculitis or significant stenosis.  Veins: Venous opacification of the IVC and iliac veins is relatively limited on the portal venous phase imaging. No definite thrombus or asymmetry identified. The hepatic, portal and renal veins are all widely patent as are the splanchnic veins.  Review of the MIP images confirms the above findings.  NON-VASCULAR  Lower chest: Incompletely imaged aortic valvular calcifications. Cardiomegaly. No pericardial effusion. Trace left-sided pleural effusion and associated mild atelectasis. No  pulmonary edema or focal airspace consolidation. Mild scarring in the inferior right middle lobe. Moderately large sliding hiatal hernia.  Hepatobiliary: Geographic hypoattenuation in the left hemi-liver adjacent to the fissure for the falciform ligament is nonspecific but most suggestive of benign focal fatty infiltration. Otherwise, no solid hepatic lesion. Subtle gallstones were better visualized on the recent noncontrast enhanced CT scan. No intra or extrahepatic biliary ductal dilatation.  Pancreas: Unremarkable. No pancreatic ductal dilatation or surrounding inflammatory changes.  Spleen: Normal in size without focal abnormality.  Adrenals/Urinary Tract: Normal adrenal glands. 2.3 cm nonenhancing simple cyst centrally in the upper pole of the left kidney. Punctate nonobstructing right lower pole nephrolithiasis again noted. No evidence of hydronephrosis. The left ureter passes through the inflammatory changes overlying the left iliac vessels without evidence of external compression. The right ureter is unremarkable. The bladder is within normal limits.  Stomach/Bowel: No evidence of obstruction or focal bowel wall thickening. Normal appendix in the right lower quadrant. The terminal ileum is unremarkable.  Lymphatic: No suspicious lymphadenopathy.  Reproductive: Prostatomegaly.  Other: Small fat containing umbilical hernia. a No abdominopelvic ascites.  Musculoskeletal: No acute fracture or aggressive appearing lytic or blastic osseous lesion. Multilevel degenerative disc disease. There is a linear tract extending from the skin surface inferiorly over the left anterior abdominal wall. A small peripherally high density nodular region is present just superficial to the left rectus abdominus muscle. Given the configuration, this likely represents chronic changes related to the presence of a prior peritoneal dialysis catheter.  IMPRESSION: VASCULAR  1. Circumferential  arterial wall thickening beginning at the aortic bifurcation and extending along the left greater than right common iliac arteries. There are associated inflammatory changes with inflammatory stranding in the immediately adjacent retroperitoneal fat and left pelvic sidewall. 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 Recommend vascular surgery consultation if not already obtained. 2. No evidence of deep venous thrombosis at this time. 3. Aortic Atherosclerosis (ICD10-170.0). 4. Cardiomegaly. 5. Aortic valvular calcifications.  NON-VASCULAR  1. No acute nonvascular abnormality within the abdomen or pelvis. 2. Nonobstructing right lower pole nephrolithiasis. 3. Left upper pole simple renal cyst. 4. Trace left-sided pleural effusion. 5. Multilevel degenerative disc disease. 6. Small umbilical hernia. 7. Moderate hiatal hernia. 8. Additional ancillary findings as above.  Signed,  Criselda Peaches, MD, [place]  [facility]  [facility]   Electronically Signed   By: Jacqulynn Cadet M.D.   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  Plan:   Discussed pathophisiologyof colon CA in depth.The risk oflaparoscopic colon resectionsurgery includes, but not limited to, recurrence, bleeding, chronic pain, post-op infxn, post-op SBO or ileus, hernias, resection of bowel, re-anastamosis, possible ostomy placement and need for re-operation to address said risks. The risks of general anesthetic, if used, includes MI,  CVA, sudden death or even reaction to anesthetic medications also discussed. Alternatives include continued observation.Benefits include possible symptom relief, preventing further decline in health  and possible death.  Typical post-op recovery time of additional days in hospital for observation afterwards also discussed.  Prep ordered. Will proceed with ERAS protocol as well.   Ascending colon cancer-we will proceed with laparoscopic right extended hemicolectomy.  Prep instructions provided.  Will obtain medical clearance from primary and/or nephrologist, ID for his recent infections episode and make sure to schedule surgery in between his dialysis days. he will need to be admitted to the hospitalist postoperative as well.  History of multiple polyps-we had a extensive discussion about obtaining the genetic testing results prior to proceeding with the surgery above.  Benefits of obtaining results prior to surgery include knowing genetic disposition to future polyps and cancer development in the remaining colon.  If he is positive for a genetic predisposition, a total colectomy versus a right hemicolectomy may be warranted.  I specifically did state that performing a laparoscopic right hemicolectomy and then subsequently performing a completion colectomy will be technically difficult and high risk if we decide to proceed with the surgery mentioned above and then obtain genetic testing afterwards.   Logistically, we will have to wait probably a couple weeks before the results are in from genetic testing.  Due to his advanced age, even if he does have a genetic predisposition, I could not say for sure if it will have any effect on his remaining years of quality of life.  Patient and son at this time stated They are agreeable to proceeding with the testing at this time, but they will like to proceed with the surgery without waiting for genetic testing results.  I verbalized understanding, and will make every effort to obtain genetic counseling appointment and test results prior to the surgery itself.   The patientand son verbalized understanding and all questions were answered to the  patient's satisfaction.

## 2018-05-09 NOTE — H&P (Signed)
Subjective:   CC: Malignant neoplasm of ascending colon (CMS-HCC) [C18.2]  HPI:  Jonathan Mcgrath is a 77 y.o. male who was referred by [facility]* for evaluation of above.  Here today to discuss surgical options.  Asymptomatic except for some diarrhea, denies melena, hematochizia.  Still receiving abx during HD for bacteremia diagnosed during previous hospital stay.   Past Medical History:  has a past medical history of Cataract cortical, senile, Diabetes mellitus type 2, uncomplicated (CMS-HCC), Dialysis patient (CMS-HCC) (Nov. 2014), Kidney disease, and Renal insufficiency.  Past Surgical History:  has a past surgical history that includes port for dialysis left arm Jan 2015; Tonsillectomy; extraction cataract extracapsular w/insertion intraocular prosthesis (Left, 08/21/2017); and extraction cataract extracapsular w/insertion intraocular prosthesis (Right, 09/11/2017). PICC placement during most recent hospitalization.  PD cath placement years ago.  Family History: family history includes Cancer in his sister; Diabetes in his mother; Lung cancer in his father.  Son has hx of multiple adenomas.  Social History:  reports that he has never smoked. He has never used smokeless tobacco. He reports that he does not drink alcohol or use drugs.  Current Medications: has a current medication list which includes the following prescription(s): ferric citrate, multivitamin, rena-vite rx, sensipar, sevelamer carbonate, bromfenac, ketorolac, and oseltamivir.  Allergies:  No Known Allergies  ROS:  A 15 point review of systems was performed and pertinent positives and negatives noted in HPI   Objective:   BP 146/64   Pulse 77   Temp 36.1 C (97 F) (Oral)   Ht 185.4 cm (6\' 1" )   Wt 92.4 kg (203 lb 9.6 oz)   BMI 26.86 kg/m   Constitutional :  alert, appears stated age, cooperative and no distress  Lymphatics/Throat:  no asymmetry, masses, or scars  Respiratory:  clear  to auscultation bilaterally  Cardiovascular:  regular rate and rhythm and murmur present  Gastrointestinal: soft, non-tender; bowel sounds normal; no masses,  no organomegaly.    Musculoskeletal: Steady gait and movement  Skin: Cool and moist, PICC in place, right chest  Psychiatric: Normal affect, non-agitated, not confused       LABS:  CEA- 4.7  SURGICAL PATHOLOGY Surgical Pathology CASE: [id number] [name] Surgical Pathology Report     SPECIMEN SUBMITTED: A. Stomach nodule; cbx B. Colon mass, cecum; cbx C. Colon polyp x2, ascending; hot snare D. Colon polyp, ascending; hot snare E. Colon, hepatic flexure, thickened fold; cbx F. Colon polyp x9, transverse; hot and cold snare G. Colon polyp, sigmoid; hot snare  CLINICAL HISTORY: None provided  PRE-OPERATIVE DIAGNOSIS: Clostridium sepsis, r/o malignancy  POST-OPERATIVE DIAGNOSIS: Gastric nodule, colon mass, colon polyps     DIAGNOSIS:  A.STOMACH NODULE; COLD BIOPSY: - HYPERPLASTIC (FOVEOLAR GLAND) POLYP. - NEGATIVE FOR DYSPLASIA AND MALIGNANCY.  B.COLON MASS, CECUM; COLD BIOPSIES: - INVASIVE WELL DIFFERENTIATED ADENOCARCINOMA. - SEE COMMENT.  C.COLON POLYP X2, ASCENDING; HOT SNARE: -TUBULAR ADENOMA (MULTIPLE FRAGMENTS). - NEGATIVE FOR HIGH-GRADE DYSPLASIA AND MALIGNANCY. - SEE COMMENT.  D.COLON POLYP, ASCENDING; HOT SNARE: -TUBULOVILLOUS ADENOMA (MULTIPLE FRAGMENTS). - NEGATIVE FOR HIGH-GRADE DYSPLASIA AND MALIGNANCY.  E.COLON, HEPATIC FLEXURE, THICKENED FOLD; COLD BIOPSY: -BENIGN COLONIC MUCOSA. -NEGATIVE FOR DYSPLASIA AND MALIGNANCY.  F.COLON POLYP X9, TRANSVERSE; HOT AND COLD SNARE: -TUBULAR ADENOMA (MULTIPLE FRAGMENTS). - NEGATIVE FOR HIGH-GRADE DYSPLASIA AND MALIGNANCY.  G.COLON POLYP, SIGMOID; HOT SNARE: - TUBULOVILLOUS ADENOMA (MULTIPLE FRAGMENTS). - NEGATIVE FOR HIGH-GRADE DYSPLASIA AND MALIGNANCY.  COMMENT:Specimen C contains fragments of inflamed  adenomatous mucosa which may represent superficial portions of Specimen B (see  Colonoscopy Operative Note).  Specimen B was reviewed in intradepartmental consultation.  Dr. Bonna Gains was notified 04/19/2018 at 12:48 PM.   GROSS DESCRIPTION: A. Labeled: Gastric nodule cbx Received: In formalin Tissue fragment(s): 1 Size: 0.3 cm Description: Red-tan fragment Entirely submitted in one cassette.  B. Labeled: Cecal mass cbx Received: In formalin Tissue fragment(s): Multiple Size: Aggregate, 1.0 x 0.4 x 0.1 cm Description: Tan-brown fragments Entirely submitted in one cassette.  C. Labeled: Ascending colon polyp hot snare x2 Received: In formalin Tissue fragment(s): Multiple Size: Aggregate, 1.8 x 0.8 x 0.1 cm Description: Pink-tan fragments (80%) and green fecal material Entirely submitted in one cassette.  D. Labeled: Ascending colon polyp hot snare Received: In formalin Tissue fragment(s): Multiple Size: Aggregate, 2.0 x 0.7 x 0.1 cm Description: Tan tissue fragments (90%) and yellow fecal material Entirely submitted in one cassette.  E. Labeled: Thickened fold hepatic flexure cbx Received: In formalin Tissue fragment(s): 2 Size: 0.2 and 0.3 cm Description: Pink-tan fragments Entirely submitted in one cassette.  F. Labeled: Transverse colon polyp x9 hot snare and cold snare Received: In formalin Tissue fragment(s): Multiple Size: Aggregate, 1.9 x 0.9 x 0.1 cm Description: Pink-tan fragments with a minimal amount of fecal material Entirely submitted in one cassette.  G. Labeled: Sigmoid colon polyp hot snare Received: In formalin Tissue fragment(s): 4 Size: 0.3-0.6 cm Description: Pink-tan fragments Entirely submitted in one cassette.   Final Diagnosis performed by Galvin Proffer, MD. Electronically signed 04/19/2018 16:07:37TG The electronic signature indicates that the named Attending Pathologist has evaluated the specimen  Technical component performed at  [facility], [address], [place], [place] [address] Lab: [phone] Dir: Rush Farmer, MD, MMMProfessional component performed at [facility], [facility], [place], [place], [place] [address] Lab: [phone] Dir: Dellia Nims. Reuel Derby, MD     RADS: CLINICAL DATA:Recent diagnosis of colon cancer.Staging exam.  EXAM: CT CHEST WITH CONTRAST  TECHNIQUE: Multidetector CT imaging of the chest was performed during intravenous contrast administration.  CONTRAST:75 mL OMNIPAQUE IOHEXOL 300 MG/MLSOLN  COMPARISON:PA and lateral chest 04/12/2018 and 08/16/2016.  FINDINGS: Cardiovascular: Heart size is normal. No pericardial effusion. Scattered calcific aortic and coronary atherosclerotic calcifications are identified. Calcification of the aortic valve is also noted.  Mediastinum/Nodes: No enlarged mediastinal, hilar, or axillary lymph nodes. Thyroid gland, trachea, and esophagus demonstrate no significant findings. Small to moderate hiatal hernia is identified.  Lungs/Pleura: No pulmonary nodule, mass or consolidative process is identified. Minimal dependent atelectasis is noted. Trace left pleural effusion is seen.  Upper Abdomen: Contrast material in the gallbladder consistent with vicarious excretion is noted. No acute abnormality is identified that cyst in the upper pole of the left kidney is unchanged. No acute abnormality is identified.  Musculoskeletal: No lytic or sclerotic lesion is identified. Bilateral gynecomastia is seen.  IMPRESSION: Negative for metastatic disease.  Very small left pleural effusion.  Small to moderate hiatal hernia.  Calcific coronary artery disease.  Gynecomastia.  Aortic Atherosclerosis (ICD10-I70.0).   Electronically Signed ByAlphonse Guild M.D. On: 04/18/2018 14:44  Other Result Information  Interface, Rad Results In - 04/18/2018  2:47 PM EDT CLINICAL DATA:  Recent diagnosis of colon  cancer.  Staging exam.  EXAM: CT CHEST WITH CONTRAST  TECHNIQUE: Multidetector CT imaging of the chest was performed during intravenous contrast administration.  CONTRAST:  75 mL OMNIPAQUE IOHEXOL 300 MG/ML  SOLN  COMPARISON:  PA and lateral chest 04/12/2018 and 08/16/2016.  FINDINGS: Cardiovascular: Heart size is normal. No pericardial effusion. Scattered calcific aortic and coronary  atherosclerotic calcifications are identified. Calcification of the aortic valve is also noted.  Mediastinum/Nodes: No enlarged mediastinal, hilar, or axillary lymph nodes. Thyroid gland, trachea, and esophagus demonstrate no significant findings. Small to moderate hiatal hernia is identified.  Lungs/Pleura: No pulmonary nodule, mass or consolidative process is identified. Minimal dependent atelectasis is noted. Trace left pleural effusion is seen.  Upper Abdomen: Contrast material in the gallbladder consistent with vicarious excretion is noted. No acute abnormality is identified that cyst in the upper pole of the left kidney is unchanged. No acute abnormality is identified.  Musculoskeletal: No lytic or sclerotic lesion is identified. Bilateral gynecomastia is seen.  IMPRESSION: Negative for metastatic disease.  Very small left pleural effusion.  Small to moderate hiatal hernia.  Calcific coronary artery disease.  Gynecomastia.  Aortic Atherosclerosis (ICD10-I70.0).   Electronically Signed   By: Inge Rise M.D.   On: 04/18/2018 14:44   Result Narrative  CLINICAL DATA:77 year old male with nausea, vomiting, diarrhea and stool cultures positive for enteropathogenic E coli.  EXAM: CT ANGIOGRAPHY ABDOMEN AND PELVIS WITH CONTRAST AND WITHOUT CONTRAST  TECHNIQUE: Multidetector CT imaging of the abdomen and pelvis was performed using the standard protocol during bolus administration of intravenous contrast. Multiplanar reconstructed images and MIPs were obtained and  reviewed to evaluate the vascular anatomy.  CONTRAST:137mL ISOVUE-370 IOPAMIDOL (ISOVUE-370) INJECTION 76%  COMPARISON:CT scan of the abdomen and pelvis 04/12/2018  FINDINGS: VASCULAR  Aorta: Mild heterogeneous atherosclerotic plaque. No evidence of aneurysm or dissection.  Celiac: Patent without evidence of aneurysm, dissection, vasculitis or significant stenosis.  SMA: Patent without evidence of aneurysm, dissection, vasculitis or significant stenosis.  Renals: Both renal arteries are patent without evidence of aneurysm, dissection, vasculitis, fibromuscular dysplasia or significant stenosis.  IMA: Patent without evidence of aneurysm, dissection, vasculitis or significant stenosis.  Inflow: Circumferential arterial wall thickening beginning at the aortic bifurcation and extending along both common iliac arteries, particularly the left. On the right, the inflammatory changes. Prior to the bifurcation. On the left, the inflammatory changes extend beyond the bifurcation and along the external iliac artery for a short distance. No significant stenosis, dissection or aneurysm. There is mild atherosclerotic plaque and irregularity along the left common iliac artery. Moderate focal plaque along the right common iliac artery. The external iliac arteries are relatively spared from disease.  Proximal Outflow: Bilateral common femoral and visualized portions of the superficial and profunda femoral arteries are patent without evidence of aneurysm, dissection, vasculitis or significant stenosis.  Veins: Venous opacification of the IVC and iliac veins is relatively limited on the portal venous phase imaging. No definite thrombus or asymmetry identified. The hepatic, portal and renal veins are all widely patent as are the splanchnic veins.  Review of the MIP images confirms the above findings.  NON-VASCULAR  Lower chest: Incompletely imaged aortic valvular  calcifications. Cardiomegaly. No pericardial effusion. Trace left-sided pleural effusion and associated mild atelectasis. No pulmonary edema or focal airspace consolidation. Mild scarring in the inferior right middle lobe. Moderately large sliding hiatal hernia.  Hepatobiliary: Geographic hypoattenuation in the left hemi-liver adjacent to the fissure for the falciform ligament is nonspecific but most suggestive of benign focal fatty infiltration. Otherwise, no solid hepatic lesion. Subtle gallstones were better visualized on the recent noncontrast enhanced CT scan. No intra or extrahepatic biliary ductal dilatation.  Pancreas: Unremarkable. No pancreatic ductal dilatation or surrounding inflammatory changes.  Spleen: Normal in size without focal abnormality.  Adrenals/Urinary Tract: Normal adrenal glands. 2.3 cm nonenhancing simple cyst centrally in the upper  pole of the left kidney. Punctate nonobstructing right lower pole nephrolithiasis again noted. No evidence of hydronephrosis. The left ureter passes through the inflammatory changes overlying the left iliac vessels without evidence of external compression. The right ureter is unremarkable. The bladder is within normal limits.  Stomach/Bowel: No evidence of obstruction or focal bowel wall thickening. Normal appendix in the right lower quadrant. The terminal ileum is unremarkable.  Lymphatic: No suspicious lymphadenopathy.  Reproductive: Prostatomegaly.  Other: Small fat containing umbilical hernia. a No abdominopelvic ascites.  Musculoskeletal: No acute fracture or aggressive appearing lytic or blastic osseous lesion. Multilevel degenerative disc disease. There is a linear tract extending from the skin surface inferiorly over the left anterior abdominal wall. A small peripherally high density nodular region is present just superficial to the left rectus abdominus muscle. Given the configuration, this likely  represents chronic changes related to the presence of a prior peritoneal dialysis catheter.  IMPRESSION: VASCULAR  1. Circumferential arterial wall thickening beginning at the aortic bifurcation and extending along the left greater than right common iliac arteries. There are associated inflammatory changes with inflammatory stranding in the immediately adjacent retroperitoneal fat and left pelvic sidewall. 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 Recommend vascular surgery consultation if not already obtained. 2. No evidence of deep venous thrombosis at this time. 3. Aortic Atherosclerosis (ICD10-170.0). 4. Cardiomegaly. 5. Aortic valvular calcifications.  NON-VASCULAR  1. No acute nonvascular abnormality within the abdomen or pelvis. 2. Nonobstructing right lower pole nephrolithiasis. 3. Left upper pole simple renal cyst. 4. Trace left-sided pleural effusion. 5. Multilevel degenerative disc disease. 6. Small umbilical hernia. 7. Moderate hiatal hernia. 8. Additional ancillary findings as above.  Signed,  Criselda Peaches, MD, [place]  [facility]  [facility]   Electronically Signed By: HeathMcCullough M.D. On: 04/15/2018 09:55  Other Result Information  Interface, Rad Results In - 04/15/2018  9:57 AM EDT CLINICAL DATA:  77 year old male with nausea, vomiting, diarrhea and stool cultures positive for enteropathogenic E coli.  EXAM: CT ANGIOGRAPHY ABDOMEN AND PELVIS WITH CONTRAST AND WITHOUT CONTRAST  TECHNIQUE: Multidetector CT imaging of the abdomen and pelvis was performed using the standard protocol during bolus administration of intravenous contrast. Multiplanar reconstructed images and MIPs were obtained and reviewed to evaluate the vascular anatomy.  CONTRAST:  137mL ISOVUE-370 IOPAMIDOL (ISOVUE-370) INJECTION  76%  COMPARISON:  CT scan of the abdomen and pelvis 04/12/2018  FINDINGS: VASCULAR  Aorta: Mild heterogeneous atherosclerotic plaque. No evidence of aneurysm or dissection.  Celiac: Patent without evidence of aneurysm, dissection, vasculitis or significant stenosis.  SMA: Patent without evidence of aneurysm, dissection, vasculitis or significant stenosis.  Renals: Both renal arteries are patent without evidence of aneurysm, dissection, vasculitis, fibromuscular dysplasia or significant stenosis.  IMA: Patent without evidence of aneurysm, dissection, vasculitis or significant stenosis.  Inflow: Circumferential arterial wall thickening beginning at the aortic bifurcation and extending along both common iliac arteries, particularly the left. On the right, the inflammatory changes. Prior to the bifurcation. On the left, the inflammatory changes extend beyond the bifurcation and along the external iliac artery for a short distance. No significant stenosis, dissection or aneurysm. There is mild atherosclerotic plaque and irregularity along the left common iliac artery. Moderate focal plaque along the right common iliac artery. The external iliac arteries are relatively spared from disease.  Proximal Outflow: Bilateral common femoral and visualized portions of the superficial and profunda femoral arteries are patent without  evidence of aneurysm, dissection, vasculitis or significant stenosis.  Veins: Venous opacification of the IVC and iliac veins is relatively limited on the portal venous phase imaging. No definite thrombus or asymmetry identified. The hepatic, portal and renal veins are all widely patent as are the splanchnic veins.  Review of the MIP images confirms the above findings.  NON-VASCULAR  Lower chest: Incompletely imaged aortic valvular calcifications. Cardiomegaly. No pericardial effusion. Trace left-sided pleural effusion and associated mild atelectasis. No  pulmonary edema or focal airspace consolidation. Mild scarring in the inferior right middle lobe. Moderately large sliding hiatal hernia.  Hepatobiliary: Geographic hypoattenuation in the left hemi-liver adjacent to the fissure for the falciform ligament is nonspecific but most suggestive of benign focal fatty infiltration. Otherwise, no solid hepatic lesion. Subtle gallstones were better visualized on the recent noncontrast enhanced CT scan. No intra or extrahepatic biliary ductal dilatation.  Pancreas: Unremarkable. No pancreatic ductal dilatation or surrounding inflammatory changes.  Spleen: Normal in size without focal abnormality.  Adrenals/Urinary Tract: Normal adrenal glands. 2.3 cm nonenhancing simple cyst centrally in the upper pole of the left kidney. Punctate nonobstructing right lower pole nephrolithiasis again noted. No evidence of hydronephrosis. The left ureter passes through the inflammatory changes overlying the left iliac vessels without evidence of external compression. The right ureter is unremarkable. The bladder is within normal limits.  Stomach/Bowel: No evidence of obstruction or focal bowel wall thickening. Normal appendix in the right lower quadrant. The terminal ileum is unremarkable.  Lymphatic: No suspicious lymphadenopathy.  Reproductive: Prostatomegaly.  Other: Small fat containing umbilical hernia. a No abdominopelvic ascites.  Musculoskeletal: No acute fracture or aggressive appearing lytic or blastic osseous lesion. Multilevel degenerative disc disease. There is a linear tract extending from the skin surface inferiorly over the left anterior abdominal wall. A small peripherally high density nodular region is present just superficial to the left rectus abdominus muscle. Given the configuration, this likely represents chronic changes related to the presence of a prior peritoneal dialysis catheter.  IMPRESSION: VASCULAR  1. Circumferential  arterial wall thickening beginning at the aortic bifurcation and extending along the left greater than right common iliac arteries. There are associated inflammatory changes with inflammatory stranding in the immediately adjacent retroperitoneal fat and left pelvic sidewall. 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 Recommend vascular surgery consultation if not already obtained. 2. No evidence of deep venous thrombosis at this time. 3. Aortic Atherosclerosis (ICD10-170.0). 4. Cardiomegaly. 5. Aortic valvular calcifications.  NON-VASCULAR  1. No acute nonvascular abnormality within the abdomen or pelvis. 2. Nonobstructing right lower pole nephrolithiasis. 3. Left upper pole simple renal cyst. 4. Trace left-sided pleural effusion. 5. Multilevel degenerative disc disease. 6. Small umbilical hernia. 7. Moderate hiatal hernia. 8. Additional ancillary findings as above.  Signed,  Criselda Peaches, MD, [place]  [facility]  [facility]   Electronically Signed   By: Jacqulynn Cadet M.D.   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  Plan:   Discussed pathophisiologyof colon CA in depth.The risk oflaparoscopic colon resectionsurgery includes, but not limited to, recurrence, bleeding, chronic pain, post-op infxn, post-op SBO or ileus, hernias, resection of bowel, re-anastamosis, possible ostomy placement and need for re-operation to address said risks. The risks of general anesthetic, if used, includes MI,  CVA, sudden death or even reaction to anesthetic medications also discussed. Alternatives include continued observation.Benefits include possible symptom relief, preventing further decline in health  and possible death.  Typical post-op recovery time of additional days in hospital for observation afterwards also discussed.  Prep ordered. Will proceed with ERAS protocol as well.   Ascending colon cancer-we will proceed with laparoscopic right extended hemicolectomy.  Prep instructions provided.  Will obtain medical clearance from primary and/or nephrologist, ID for his recent infections episode and make sure to schedule surgery in between his dialysis days. he will need to be admitted to the hospitalist postoperative as well.  History of multiple polyps-we had a extensive discussion about obtaining the genetic testing results prior to proceeding with the surgery above.  Benefits of obtaining results prior to surgery include knowing genetic disposition to future polyps and cancer development in the remaining colon.  If he is positive for a genetic predisposition, a total colectomy versus a right hemicolectomy may be warranted.  I specifically did state that performing a laparoscopic right hemicolectomy and then subsequently performing a completion colectomy will be technically difficult and high risk if we decide to proceed with the surgery mentioned above and then obtain genetic testing afterwards.   Logistically, we will have to wait probably a couple weeks before the results are in from genetic testing.  Due to his advanced age, even if he does have a genetic predisposition, I could not say for sure if it will have any effect on his remaining years of quality of life.  Patient and son at this time stated They are agreeable to proceeding with the testing at this time, but they will like to proceed with the surgery without waiting for genetic testing results.  I verbalized understanding, and will make every effort to obtain genetic counseling appointment and test results prior to the surgery itself.   The patientand son verbalized understanding and all questions were answered to the  patient's satisfaction.

## 2018-05-10 ENCOUNTER — Encounter: Payer: Self-pay | Admitting: Oncology

## 2018-05-10 ENCOUNTER — Inpatient Hospital Stay: Payer: Medicare HMO | Attending: Oncology | Admitting: Oncology

## 2018-05-10 VITALS — BP 139/65 | HR 69 | Temp 97.3°F | Resp 18 | Ht 73.0 in | Wt 205.1 lb

## 2018-05-10 DIAGNOSIS — N185 Chronic kidney disease, stage 5: Secondary | ICD-10-CM

## 2018-05-10 DIAGNOSIS — I10 Essential (primary) hypertension: Secondary | ICD-10-CM

## 2018-05-10 DIAGNOSIS — D696 Thrombocytopenia, unspecified: Secondary | ICD-10-CM

## 2018-05-10 DIAGNOSIS — C9110 Chronic lymphocytic leukemia of B-cell type not having achieved remission: Secondary | ICD-10-CM | POA: Diagnosis not present

## 2018-05-10 DIAGNOSIS — Z8041 Family history of malignant neoplasm of ovary: Secondary | ICD-10-CM

## 2018-05-10 DIAGNOSIS — D631 Anemia in chronic kidney disease: Secondary | ICD-10-CM

## 2018-05-10 DIAGNOSIS — D490 Neoplasm of unspecified behavior of digestive system: Secondary | ICD-10-CM

## 2018-05-10 DIAGNOSIS — Z79899 Other long term (current) drug therapy: Secondary | ICD-10-CM | POA: Diagnosis not present

## 2018-05-10 DIAGNOSIS — C182 Malignant neoplasm of ascending colon: Secondary | ICD-10-CM | POA: Diagnosis not present

## 2018-05-10 NOTE — Progress Notes (Signed)
Met with Jonathan Mcgrath and his son Jonathan Mcgrath, before and during consult with Dr. Janese Banks. Inroduced nurse navigator services and provided contact information for future needs. He receives dialysis on M/W/F. No other barriers at this time. Surgery is scheduled for 10/31 with Dr. [name].  Oncology Nurse Navigator Documentation  Navigator Location: [facility]-Med Onc (05/10/18 1300)   )Navigator Encounter Type: Initial MedOnc (05/10/18 1300)                                                    Time Spent with Patient: 30 (05/10/18 1300)

## 2018-05-10 NOTE — Progress Notes (Signed)
Hematology/Oncology Consult note [facility]  Telephone:([phone] [phone]  Patient Care Team: Patient, No Pcp Per as PCP - General (General Practice)   Name of the [name]  [id number]  1941-02-27   Date of visit: 05/10/18  Diagnosis- new diagnosis of colon cancer  Chief complaint/ Reason for visit- post hospital discharge f/u   Heme/Onc history: Patient is a 77 yr old male with a PMH significant for HTN and ESRD on HD. He presented with fever, diarrhea and vomiting. Blood cultures showed clostridium septicum. CT abdomen showed aorto iliac arteritis. ID was consulted and they recommended colonoscopy as it can be associated with colon cancer. He has never had EGD or colonoscopy before. Colonoscopy showed non obstructing mass in the ascending colon. 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 CT chest abdomen pelvis did not reveal evidence of metastatic disease.  Patient is still getting antibiotics for his Clostridium septicum infection.  He has seen Dr. [name] and will be undergoing surgery next week.  Patient also found to have chronic lymphocytosis with waxing and waning white count between 12-19.  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  He also has anemia of chronic kidney disease and his baseline hemoglobin is around 10   Interval history-he is here with his son today.  Overall he is doing well appetite is good.  Denies any abdominal pain or fever.  Denies any nausea vomiting.  Denies any blood in his stool  ECOG PS- 2 Pain scale- 0 Opioid associated constipation- no  Review of systems- Review of Systems  Constitutional: Negative for chills, fever, malaise/fatigue and weight loss.  HENT: Negative for congestion, ear discharge and nosebleeds.   Eyes: Negative for blurred vision.  Respiratory: Negative for cough, hemoptysis, sputum production, shortness of breath and wheezing.   Cardiovascular: Negative for  chest pain, palpitations, orthopnea and claudication.  Gastrointestinal: Negative for abdominal pain, blood in stool, constipation, diarrhea, heartburn, melena, nausea and vomiting.  Genitourinary: Negative for dysuria, flank pain, frequency, hematuria and urgency.  Musculoskeletal: Negative for back pain, joint pain and myalgias.  Skin: Negative for rash.  Neurological: Negative for dizziness, tingling, focal weakness, seizures, weakness and headaches.  Endo/Heme/Allergies: Does not bruise/bleed easily.  Psychiatric/Behavioral: Negative for depression and suicidal ideas. The patient does not have insomnia.       No Known Allergies   Past Medical History:  Diagnosis Date  . Chronic kidney disease   . Hemodialysis access site with arteriovenous graft ([place])   . Hypertension   . Renal insufficiency      Past Surgical History:  Procedure Laterality Date  . AV FISTULA PLACEMENT Left 2015   arm  . CENTRAL LINE INSERTION-TUNNELED N/A 04/19/2018   Procedure: CENTRAL LINE INSERTION-TUNNELED;  Surgeon: Delana Meyer Dolores Lory, MD;  Location: [facility];  Service: Cardiovascular;  Laterality: N/A;  . COLONOSCOPY N/A 04/18/2018   Procedure: COLONOSCOPY;  Surgeon: Virgel Manifold, MD;  Location: [facility] ENDOSCOPY;  Service: Endoscopy;  Laterality: N/A;  . ESOPHAGOGASTRODUODENOSCOPY N/A 04/18/2018   Procedure: ESOPHAGOGASTRODUODENOSCOPY (EGD);  Surgeon: Virgel Manifold, MD;  Location: [facility] ENDOSCOPY;  Service: Endoscopy;  Laterality: N/A;  . PERIPHERAL VASCULAR CATHETERIZATION Left 01/12/2016   Procedure: A/V Shuntogram/Fistulagram;  Surgeon: Algernon Huxley, MD;  Location: [facility];  Service: Cardiovascular;  Laterality: Left;  . PERIPHERAL VASCULAR CATHETERIZATION N/A 01/12/2016   Procedure: A/V Shunt Intervention;  Surgeon: Algernon Huxley, MD;  Location: [facility];  Service:  Cardiovascular;  Laterality: N/A;  . PERITONEAL CATHETER INSERTION N/A   . REMOVAL OF A  DIALYSIS CATHETER N/A 01/14/2015   Procedure: REMOVAL OF A  PERITONEAL DIALYSIS CATHETER;  Surgeon: Algernon Huxley, MD;  Location: [facility] ORS;  Service: Vascular;  Laterality: N/A;  . TEE WITHOUT CARDIOVERSION N/A 04/19/2018   Procedure: TRANSESOPHAGEAL ECHOCARDIOGRAM (TEE);  Surgeon: Teodoro Spray, MD;  Location: [facility] ORS;  Service: Cardiovascular;  Laterality: N/A;    Social History   Socioeconomic History  . Marital status: Married    Spouse name: Not on file  . Number of children: Not on file  . Years of education: Not on file  . Highest education level: Not on file  Occupational History  . Occupation: retired  Scientific laboratory technician  . Financial resource strain: Not on file  . Food insecurity:    Worry: Not on file    Inability: Not on file  . Transportation needs:    Medical: Not on file    Non-medical: Not on file  Tobacco Use  . Smoking status: Never Smoker  . Smokeless tobacco: Never Used  Substance and Sexual Activity  . Alcohol use: No  . Drug use: No  . Sexual activity: Not on file  Lifestyle  . Physical activity:    Days per week: Not on file    Minutes per session: Not on file  . Stress: Not on file  Relationships  . Social connections:    Talks on phone: Not on file    Gets together: Not on file    Attends religious service: Not on file    Active member of club or organization: Not on file    Attends meetings of clubs or organizations: Not on file    Relationship status: Not on file  . Intimate partner violence:    Fear of current or ex partner: Not on file    Emotionally abused: Not on file    Physically abused: Not on file    Forced sexual activity: Not on file  Other Topics Concern  . Not on file  Social History Narrative  . Not on file    Family History  Problem Relation Age of Onset  . Diabetes Mother   . Heart disease Father      Current Outpatient Medications:  .  amLODipine (NORVASC) 5 MG tablet, Take 1 tablet (5 mg total) by mouth daily., Disp:  30 tablet, Rfl: 0 .  cinacalcet (SENSIPAR) 30 MG tablet, Take 30 mg by mouth every morning., Disp: , Rfl:  .  ferric citrate (AURYXIA) 1 GM 210 MG(Fe) tablet, Take 1 tablet by mouth daily., Disp: , Rfl:  .  metroNIDAZOLE (FLAGYL) 500 MG tablet, Take 1 tablet (500 mg total) by mouth 3 (three) times daily., Disp: 45 tablet, Rfl: 1 .  multivitamin (RENA-VIT) TABS tablet, Take 1 tablet by mouth daily., Disp: , Rfl: 11 .  metroNIDAZOLE (FLAGYL) 500 MG tablet, Take 500 mg by mouth 3 (three) times daily., Disp: , Rfl:   Physical exam:  Vitals:   05/10/18 1115  BP: 139/65  Pulse: 69  Resp: 18  Temp: (!) 97.3 F (36.3 C)  TempSrc: Tympanic  SpO2: 94%  Weight: 205 lb 1.6 oz (93 kg)  Height: '6\' 1"'  (1.854 m)   Physical Exam  Constitutional: He is oriented to person, place, and time. He appears well-developed and well-nourished.  Elderly gentleman who ambulates with a cane.  Appears in no acute distress  HENT:  Head: Normocephalic  and atraumatic.  Eyes: Pupils are equal, round, and reactive to light. EOM are normal.  Neck: Normal range of motion.  Cardiovascular: Normal rate, regular rhythm and normal heart sounds.  Pulmonary/Chest: Effort normal and breath sounds normal.  Abdominal: Soft. Bowel sounds are normal.  Neurological: He is alert and oriented to person, place, and time.  Skin: Skin is warm and dry.     CMP Latest Ref Rng & Units 04/16/2018  Glucose 70 - 99 mg/dL 108(H)  BUN 8 - 23 mg/dL 26(H)  Creatinine 0.61 - 1.24 mg/dL 6.59(H)  Sodium 135 - 145 mmol/L 138  Potassium 3.5 - 5.1 mmol/L 3.8  Chloride 98 - 111 mmol/L 97(L)  CO2 22 - 32 mmol/L 32  Calcium 8.9 - 10.3 mg/dL 8.0(L)  Total Protein 6.5 - 8.1 g/dL -  Total Bilirubin 0.3 - 1.2 mg/dL -  Alkaline Phos 38 - 126 U/L -  AST 15 - 41 U/L -  ALT 0 - 44 U/L -   CBC Latest Ref Rng & Units 04/18/2018  WBC 3.8 - 10.6 K/uL 12.2(H)  Hemoglobin 13.0 - 18.0 g/dL 10.0(L)  Hematocrit 40.0 - 52.0 % 28.7(L)  Platelets 150 - 440  K/uL 161    No images are attached to the encounter.  Ct Abdomen Pelvis Wo Contrast  Result Date: 04/12/2018 CLINICAL DATA:  Fever and chills, nausea and diarrhea EXAM: CT ABDOMEN AND PELVIS WITHOUT CONTRAST TECHNIQUE: Multidetector CT imaging of the abdomen and pelvis was performed following the standard protocol without IV contrast. COMPARISON:  01/28/2011. FINDINGS: Lower chest: Lung bases are clear. Moderate-sized hiatal hernia is noted. Hepatobiliary: Tiny dependent gallstones are noted. The liver is within normal limits. Pancreas: Unremarkable. No pancreatic ductal dilatation or surrounding inflammatory changes. Spleen: Normal in size without focal abnormality. Adrenals/Urinary Tract: Adrenal glands are within normal limits. The kidneys are well visualized and demonstrate tiny renal calculi bilaterally. Some vascular calcifications are noted as well. In the upper pole of the left kidney there is a vague hypodensity which measures 3.2 cm in greatest dimension. This was seen on a prior CT examination measuring 2.6 cm. Bladder is well distended. No obstructive changes are seen. Stomach/Bowel: Minimal diverticular change of the colon is noted. No obstructive or inflammatory changes of the colon are seen. The appendix is well visualized and within normal limits. No obstructive small bowel changes are seen. Vascular/Lymphatic: Aortic calcifications are noted. No significant lymphadenopathy is identified. Reproductive: Prostate is unremarkable. Other: No free fluid is noted within the abdomen or pelvis. Along the anterior aspect of the distal aorta and proximal common iliac artery there is some vague inflammatory changes surrounding the mesenteric vessels. No significant lymphadenopathy is noted. No focal fluid collection is seen. Musculoskeletal: Degenerative changes of lumbar spine are noted. IMPRESSION: Mild inflammatory changes surrounding the inferior mesenteric artery and inferior mesenteric vein along  the anterior aspect of the aorta and left common iliac artery. No focal fluid collection is noted. The etiology is uncertain as no inflamed loops of bowel are noted adjacent to these changes. This may be related to some localized venous thrombosis. Moderate-sized hiatal hernia. Cholelithiasis without complicating factors. Hypodensity in the upper pole of the left kidney which is larger than that seen in 2012 but likely represents a complicated cyst. Nonemergent ultrasound may be helpful for further evaluation. Electronically Signed   By: Inez Catalina M.D.   On: 04/12/2018 07:52   Dg Chest 2 View  Result Date: 04/12/2018 CLINICAL DATA:  77 year old male with a  history of nausea vomiting EXAM: CHEST - 2 VIEW COMPARISON:  09/24/2017 FINDINGS: Cardiomediastinal silhouette unchanged in size and contour. Mild interlobular septal thickening. No pleural effusion or pneumothorax. No confluent airspace disease. Stent within left upper extremity vasculature. No displaced fracture.  Degenerative changes of the spine. IMPRESSION: Mild interlobular septal thickening may represent chronic changes in the setting of prior episodes of CHF, and/or mild acute edema. Electronically Signed   By: Corrie Mckusick D.O.   On: 04/12/2018 07:08   Ct Chest W Contrast  Result Date: 04/18/2018 CLINICAL DATA:  Recent diagnosis of colon cancer.  Staging exam. EXAM: CT CHEST WITH CONTRAST TECHNIQUE: Multidetector CT imaging of the chest was performed during intravenous contrast administration. CONTRAST:  75 mL OMNIPAQUE IOHEXOL 300 MG/ML  SOLN COMPARISON:  PA and lateral chest 04/12/2018 and 08/16/2016. FINDINGS: Cardiovascular: Heart size is normal. No pericardial effusion. Scattered calcific aortic and coronary atherosclerotic calcifications are identified. Calcification of the aortic valve is also noted. Mediastinum/Nodes: No enlarged mediastinal, hilar, or axillary lymph nodes. Thyroid gland, trachea, and esophagus demonstrate no  significant findings. Small to moderate hiatal hernia is identified. Lungs/Pleura: No pulmonary nodule, mass or consolidative process is identified. Minimal dependent atelectasis is noted. Trace left pleural effusion is seen. Upper Abdomen: Contrast material in the gallbladder consistent with vicarious excretion is noted. No acute abnormality is identified that cyst in the upper pole of the left kidney is unchanged. No acute abnormality is identified. Musculoskeletal: No lytic or sclerotic lesion is identified. Bilateral gynecomastia is seen. IMPRESSION: Negative for metastatic disease. Very small left pleural effusion. Small to moderate hiatal hernia. Calcific coronary artery disease. Gynecomastia. Aortic Atherosclerosis (ICD10-I70.0). Electronically Signed   By: Inge Rise M.D.   On: 04/18/2018 14:44   [place] Ekg Site Rite  Result Date: 04/18/2018 If Site Rite image not attached, placement could not be confirmed due to current cardiac rhythm.  Ct Angio Abd/pel W/ And/or W/o  Result Date: 04/15/2018 CLINICAL DATA:  77 year old male with nausea, vomiting, diarrhea and stool cultures positive for enteropathogenic E coli. EXAM: CT ANGIOGRAPHY ABDOMEN AND PELVIS WITH CONTRAST AND WITHOUT CONTRAST TECHNIQUE: Multidetector CT imaging of the abdomen and pelvis was performed using the standard protocol during bolus administration of intravenous contrast. Multiplanar reconstructed images and MIPs were obtained and reviewed to evaluate the vascular anatomy. CONTRAST:  163m ISOVUE-370 IOPAMIDOL (ISOVUE-370) INJECTION 76% COMPARISON:  CT scan of the abdomen and pelvis 04/12/2018 FINDINGS: VASCULAR Aorta: Mild heterogeneous atherosclerotic plaque. No evidence of aneurysm or dissection. Celiac: Patent without evidence of aneurysm, dissection, vasculitis or significant stenosis. SMA: Patent without evidence of aneurysm, dissection, vasculitis or significant stenosis. Renals: Both renal arteries are patent without  evidence of aneurysm, dissection, vasculitis, fibromuscular dysplasia or significant stenosis. IMA: Patent without evidence of aneurysm, dissection, vasculitis or significant stenosis. Inflow: Circumferential arterial wall thickening beginning at the aortic bifurcation and extending along both common iliac arteries, particularly the left. On the right, the inflammatory changes. Prior to the bifurcation. On the left, the inflammatory changes extend beyond the bifurcation and along the external iliac artery for a short distance. No significant stenosis, dissection or aneurysm. There is mild atherosclerotic plaque and irregularity along the left common iliac artery. Moderate focal plaque along the right common iliac artery. The external iliac arteries are relatively spared from disease. Proximal Outflow: Bilateral common femoral and visualized portions of the superficial and profunda femoral arteries are patent without evidence of aneurysm, dissection, vasculitis or significant stenosis. Veins: Venous opacification of the IVC and  iliac veins is relatively limited on the portal venous phase imaging. No definite thrombus or asymmetry identified. The hepatic, portal and renal veins are all widely patent as are the splanchnic veins. Review of the MIP images confirms the above findings. NON-VASCULAR Lower chest: Incompletely imaged aortic valvular calcifications. Cardiomegaly. No pericardial effusion. Trace left-sided pleural effusion and associated mild atelectasis. No pulmonary edema or focal airspace consolidation. Mild scarring in the inferior right middle lobe. Moderately large sliding hiatal hernia. Hepatobiliary: Geographic hypoattenuation in the left hemi-liver adjacent to the fissure for the falciform ligament is nonspecific but most suggestive of benign focal fatty infiltration. Otherwise, no solid hepatic lesion. Subtle gallstones were better visualized on the recent noncontrast enhanced CT scan. No intra or  extrahepatic biliary ductal dilatation. Pancreas: Unremarkable. No pancreatic ductal dilatation or surrounding inflammatory changes. Spleen: Normal in size without focal abnormality. Adrenals/Urinary Tract: Normal adrenal glands. 2.3 cm nonenhancing simple cyst centrally in the upper pole of the left kidney. Punctate nonobstructing right lower pole nephrolithiasis again noted. No evidence of hydronephrosis. The left ureter passes through the inflammatory changes overlying the left iliac vessels without evidence of external compression. The right ureter is unremarkable. The bladder is within normal limits. Stomach/Bowel: No evidence of obstruction or focal bowel wall thickening. Normal appendix in the right lower quadrant. The terminal ileum is unremarkable. Lymphatic: No suspicious lymphadenopathy. Reproductive: Prostatomegaly. Other: Small fat containing umbilical hernia. a No abdominopelvic ascites. Musculoskeletal: No acute fracture or aggressive appearing lytic or blastic osseous lesion. Multilevel degenerative disc disease. There is a linear tract extending from the skin surface inferiorly over the left anterior abdominal wall. A small peripherally high density nodular region is present just superficial to the left rectus abdominus muscle. Given the configuration, this likely represents chronic changes related to the presence of a prior peritoneal dialysis catheter. IMPRESSION: VASCULAR 1. Circumferential arterial wall thickening beginning at the aortic bifurcation and extending along the left greater than right common iliac arteries. There are associated inflammatory changes with inflammatory stranding in the immediately adjacent retroperitoneal fat and left pelvic sidewall. 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 Recommend vascular surgery consultation if not already obtained. 2. No evidence of deep venous thrombosis at this  time. 3. Aortic Atherosclerosis (ICD10-170.0). 4. Cardiomegaly. 5. Aortic valvular calcifications. NON-VASCULAR 1. No acute nonvascular abnormality within the abdomen or pelvis. 2. Nonobstructing right lower pole nephrolithiasis. 3. Left upper pole simple renal cyst. 4. Trace left-sided pleural effusion. 5. Multilevel degenerative disc disease. 6. Small umbilical hernia. 7. Moderate hiatal hernia. 8. Additional ancillary findings as above. Signed, Criselda Peaches, MD, [facility] Electronically Signed   By: Jacqulynn Cadet M.D.   On: 04/15/2018 09:55     Assessment and plan- Patient is a 77 y.o. male with following issues:  1.  Invasive adenocarcinoma of the colon: Patient will be going through definitive surgery next week.  I will see him back in 3 weeks time to discuss the results of final pathology and further management.  Briefly discussed different stages of colon cancer and role of chemotherapy in stage II and stage III colon cancer.  2.  Anemia of chronic kidney disease: His hemoglobin has remained stable around 10 over the last 3 years.  I do not feel that his CLL is contributing to his anemia.  If his hemoglobin drifts down to less than 10 trial of Procrit should be considered and can  be given to him at dialysis.  If his hemoglobin does not improve despite Procrit bone marrow biopsy could be considered at that time.  3.  CLL: Patient has had chronic lymphocytosis with white count waxing and waning between 12-20 in the past.  Differential mainly shows neutrophilia and lymphocytosis.  Flow cytometry consistent with CLL.  At this time patient does not have any B symptoms that she has unintentional weight loss drenching night sweats palpable bulky adenopathy.  No significant adenopathy noted on his CT scans either.  His anemia is not due to CLL but because of his chronic kidney disease.  Mild stable thrombocytopenia.  He does not  require any treatment for his CLL at this time  4.  Patient found to have 10+ polyps on his colonoscopy.  Family history significant for ovarian cancer in his sister.  I will touch base with genetic counseling to see if he meets criteria for genetic testing  I will see him back in 3 weeks time to discuss the results of his pathology and further management   Total face to face encounter time for this patient visit was 40 min. >50% of the time was  spent in counseling and coordination of care.     Visit Diagnosis 1. CLL (chronic lymphocytic leukemia) ([place])   2. Colon neoplasm   3. Anemia of chronic kidney failure, stage 5 (HCC)      Dr. Randa Evens, MD, MPH [facility] [phone] 05/10/2018 12:13 PM

## 2018-05-13 ENCOUNTER — Other Ambulatory Visit: Payer: Self-pay | Admitting: Surgery

## 2018-05-13 DIAGNOSIS — I776 Arteritis, unspecified: Secondary | ICD-10-CM

## 2018-05-14 ENCOUNTER — Ambulatory Visit
Admission: RE | Admit: 2018-05-14 | Discharge: 2018-05-14 | Disposition: A | Discharge status: Home / Self Care | Payer: Medicare HMO | Source: Ambulatory Visit | Attending: Surgery | Admitting: Surgery

## 2018-05-14 ENCOUNTER — Other Ambulatory Visit: Payer: Self-pay

## 2018-05-14 ENCOUNTER — Encounter
Admission: RE | Admit: 2018-05-14 | Discharge: 2018-05-14 | Disposition: A | Discharge status: Home / Self Care | Payer: Medicare HMO | Source: Ambulatory Visit | Attending: Surgery | Admitting: Surgery

## 2018-05-14 ENCOUNTER — Ambulatory Visit: Payer: Medicare HMO | Admitting: Infectious Disease

## 2018-05-14 DIAGNOSIS — C182 Malignant neoplasm of ascending colon: Secondary | ICD-10-CM | POA: Insufficient documentation

## 2018-05-14 DIAGNOSIS — I776 Arteritis, unspecified: Secondary | ICD-10-CM | POA: Insufficient documentation

## 2018-05-14 DIAGNOSIS — Z01812 Encounter for preprocedural laboratory examination: Secondary | ICD-10-CM

## 2018-05-14 DIAGNOSIS — E1122 Type 2 diabetes mellitus with diabetic chronic kidney disease: Secondary | ICD-10-CM | POA: Insufficient documentation

## 2018-05-14 DIAGNOSIS — N189 Chronic kidney disease, unspecified: Secondary | ICD-10-CM

## 2018-05-14 DIAGNOSIS — I772 Rupture of artery: Secondary | ICD-10-CM | POA: Insufficient documentation

## 2018-05-14 DIAGNOSIS — Z79899 Other long term (current) drug therapy: Secondary | ICD-10-CM

## 2018-05-14 DIAGNOSIS — I129 Hypertensive chronic kidney disease with stage 1 through stage 4 chronic kidney disease, or unspecified chronic kidney disease: Secondary | ICD-10-CM | POA: Insufficient documentation

## 2018-05-14 DIAGNOSIS — N289 Disorder of kidney and ureter, unspecified: Secondary | ICD-10-CM

## 2018-05-14 HISTORY — DX: Type 2 diabetes mellitus without complications: E11.9

## 2018-05-14 HISTORY — DX: Malignant (primary) neoplasm, unspecified: C80.1

## 2018-05-14 LAB — TYPE AND SCREEN
ABO/RH(D): O POS
Antibody Screen: NEGATIVE

## 2018-05-14 LAB — CBC WITH DIFFERENTIAL/PLATELET
Abs Immature Granulocytes: 0.04 10*3/uL (ref 0.00–0.07)
Basophils Absolute: 0.1 10*3/uL (ref 0.0–0.1)
Basophils Relative: 0 %
Eosinophils Absolute: 0.1 10*3/uL (ref 0.0–0.5)
Eosinophils Relative: 1 %
HCT: 25.8 % — ABNORMAL LOW (ref 39.0–52.0)
Hemoglobin: 8.3 g/dL — ABNORMAL LOW (ref 13.0–17.0)
Immature Granulocytes: 0 %
Lymphocytes Relative: 68 %
Lymphs Abs: 8.2 10*3/uL — ABNORMAL HIGH (ref 0.7–4.0)
MCH: 29.3 pg (ref 26.0–34.0)
MCHC: 32.2 g/dL (ref 30.0–36.0)
MCV: 91.2 fL (ref 80.0–100.0)
Monocytes Absolute: 0.7 10*3/uL (ref 0.1–1.0)
Monocytes Relative: 6 %
Neutro Abs: 3 10*3/uL (ref 1.7–7.7)
Neutrophils Relative %: 25 %
Platelets: 148 10*3/uL — ABNORMAL LOW (ref 150–400)
RBC: 2.83 MIL/uL — ABNORMAL LOW (ref 4.22–5.81)
RDW: 14.7 % (ref 11.5–15.5)
WBC: 12 10*3/uL — ABNORMAL HIGH (ref 4.0–10.5)
nRBC: 0 % (ref 0.0–0.2)

## 2018-05-14 LAB — BASIC METABOLIC PANEL
Anion gap: 12 (ref 5–15)
BUN: 26 mg/dL — ABNORMAL HIGH (ref 8–23)
CO2: 33 mmol/L — ABNORMAL HIGH (ref 22–32)
Calcium: 7.6 mg/dL — ABNORMAL LOW (ref 8.9–10.3)
Chloride: 97 mmol/L — ABNORMAL LOW (ref 98–111)
Creatinine, Ser: 7.03 mg/dL — ABNORMAL HIGH (ref 0.61–1.24)
GFR calc Af Amer: 8 mL/min — ABNORMAL LOW (ref >60)
GFR calc non Af Amer: 7 mL/min — ABNORMAL LOW (ref >60)
Glucose, Bld: 136 mg/dL — ABNORMAL HIGH (ref 70–99)
Potassium: 3.5 mmol/L (ref 3.5–5.1)
Sodium: 142 mmol/L (ref 135–145)

## 2018-05-14 LAB — PROTIME-INR
INR: 1.12
Prothrombin Time: 14.3 seconds (ref 11.4–15.2)

## 2018-05-14 MED ORDER — IOPAMIDOL (ISOVUE-370) INJECTION 76%
100.0000 mL | Freq: Once | INTRAVENOUS | Status: AC | PRN
  Administered 2018-05-14: 100 mL via INTRAVENOUS

## 2018-05-14 NOTE — Patient Instructions (Signed)
Your procedure is scheduled on: Thursday, May 16, 2018 Report to Day Surgery on the 2nd floor of the [facility]. To find out your arrival time, please call [phone] between 1PM - 3PM on: Wednesday, May 15, 2018  REMEMBER: Instructions that are not followed completely may result in serious medical risk, up to and including death; or upon the discretion of your surgeon and anesthesiologist your surgery may need to be rescheduled.  Do not eat food after midnight the night before surgery. FOLLOW CLEAR LIQUID INSTRUCTIONS GIVEN TO YOU FROM DR. SAKAI No gum chewing, lozengers or hard candies.  You may however, drink CLEAR liquids up to 2 hours before you are scheduled to arrive for your surgery. Do not drink anything within 2 hours of the start of your surgery.  Clear liquids include: - water  - apple juice without pulp - gatorade - black coffee or tea (Do NOT add milk or creamers to the coffee or tea) Do NOT drink anything that is not on this list.  No Alcohol for 24 hours before or after surgery.  No Smoking including e-cigarettes for 24 hours prior to surgery.  No chewable tobacco products for at least 6 hours prior to surgery.  No nicotine patches on the day of surgery.  On the morning of surgery brush your teeth with toothpaste and water, you may rinse your mouth with mouthwash if you wish. Do not swallow any toothpaste or mouthwash.  Notify your doctor if there is any change in your medical condition (cold, fever, infection).  Do not wear jewelry, make-up, hairpins, clips or nail polish.  Do not wear lotions, powders, or perfumes.   Do not shave 48 hours prior to surgery.   Contacts and dentures may not be worn into surgery.  Do not bring valuables to the hospital, including drivers license, insurance or credit cards.  [facility] is not responsible for any belongings or valuables.   TAKE THESE MEDICATIONS THE MORNING OF SURGERY:  1.  AMLODIPINE 2.   SENSIPAR  Use CHG Soap as directed on instruction sheet.  FOLLOW INSTRUCTIONS GIVEN TO YOU FROM DR. SAKAI ABOUT BOWEL PREPARATION.  NOW!  Stop Anti-inflammatories (NSAIDS) such as Advil, Aleve, Ibuprofen, Motrin, Naproxen, Naprosyn and Aspirin based products such as Excedrin, Goodys Powder, BC Powder. (May take Tylenol or Acetaminophen if needed.)  NOW!  Stop ANY OVER THE COUNTER supplements until after surgery. (May continue Vitamin D and multivitamin.)  If you are being admitted to the hospital overnight, leave your suitcase in the car. After surgery it may be brought to your room.  Please call [phone] if you have any questions about these instructions.

## 2018-05-15 MED ORDER — METRONIDAZOLE IN NACL 5-0.79 MG/ML-% IV SOLN
500.0000 mg | INTRAVENOUS | Status: AC
  Administered 2018-05-16: 500 mg via INTRAVENOUS
  Filled 2018-05-15: qty 100

## 2018-05-15 MED ORDER — CEFAZOLIN SODIUM-DEXTROSE 2-4 GM/100ML-% IV SOLN
2.0000 g | INTRAVENOUS | Status: AC
  Administered 2018-05-16: 2 g via INTRAVENOUS

## 2018-05-15 NOTE — Telephone Encounter (Signed)
Pt not sure he wants to see a Dietitian. He states he is 76 ys. Old and feels this may not be necessary. Would like for me to call him back in a couple of weeks and he will let me know. Also the counselor in [place] at the [place] center is rarely here and they suggested he may need to see someone else, so this would be at [facility].

## 2018-05-16 ENCOUNTER — Encounter
Admission: RE | Disposition: A | Discharge status: Home Health | Payer: Self-pay | Source: Home / Self Care | Attending: Surgery

## 2018-05-16 ENCOUNTER — Inpatient Hospital Stay: Payer: Medicare HMO | Admitting: Anesthesiology

## 2018-05-16 ENCOUNTER — Inpatient Hospital Stay
Admission: RE | Admit: 2018-05-16 | Discharge: 2018-05-20 | DRG: 329 | Disposition: A | Discharge status: Home Health | Payer: Medicare HMO | Attending: Surgery | Admitting: Surgery

## 2018-05-16 ENCOUNTER — Other Ambulatory Visit: Payer: Self-pay

## 2018-05-16 ENCOUNTER — Encounter: Payer: Self-pay | Admitting: *Deleted

## 2018-05-16 ENCOUNTER — Ambulatory Visit: Payer: Medicare HMO

## 2018-05-16 DIAGNOSIS — Z86010 Personal history of colonic polyps: Secondary | ICD-10-CM

## 2018-05-16 DIAGNOSIS — Z833 Family history of diabetes mellitus: Secondary | ICD-10-CM | POA: Diagnosis not present

## 2018-05-16 DIAGNOSIS — C189 Malignant neoplasm of colon, unspecified: Secondary | ICD-10-CM | POA: Diagnosis present

## 2018-05-16 DIAGNOSIS — N186 End stage renal disease: Secondary | ICD-10-CM | POA: Diagnosis present

## 2018-05-16 DIAGNOSIS — Z79899 Other long term (current) drug therapy: Secondary | ICD-10-CM | POA: Diagnosis not present

## 2018-05-16 DIAGNOSIS — E1122 Type 2 diabetes mellitus with diabetic chronic kidney disease: Secondary | ICD-10-CM | POA: Diagnosis present

## 2018-05-16 DIAGNOSIS — N2581 Secondary hyperparathyroidism of renal origin: Secondary | ICD-10-CM | POA: Diagnosis present

## 2018-05-16 DIAGNOSIS — I776 Arteritis, unspecified: Secondary | ICD-10-CM | POA: Diagnosis present

## 2018-05-16 DIAGNOSIS — Z9842 Cataract extraction status, left eye: Secondary | ICD-10-CM | POA: Diagnosis not present

## 2018-05-16 DIAGNOSIS — K219 Gastro-esophageal reflux disease without esophagitis: Secondary | ICD-10-CM | POA: Diagnosis present

## 2018-05-16 DIAGNOSIS — C182 Malignant neoplasm of ascending colon: Principal | ICD-10-CM | POA: Diagnosis present

## 2018-05-16 DIAGNOSIS — D631 Anemia in chronic kidney disease: Secondary | ICD-10-CM | POA: Diagnosis present

## 2018-05-16 DIAGNOSIS — Z961 Presence of intraocular lens: Secondary | ICD-10-CM | POA: Diagnosis present

## 2018-05-16 DIAGNOSIS — I120 Hypertensive chronic kidney disease with stage 5 chronic kidney disease or end stage renal disease: Secondary | ICD-10-CM | POA: Diagnosis present

## 2018-05-16 DIAGNOSIS — Z9841 Cataract extraction status, right eye: Secondary | ICD-10-CM | POA: Diagnosis not present

## 2018-05-16 DIAGNOSIS — R7881 Bacteremia: Secondary | ICD-10-CM | POA: Diagnosis present

## 2018-05-16 DIAGNOSIS — Z992 Dependence on renal dialysis: Secondary | ICD-10-CM | POA: Diagnosis not present

## 2018-05-16 HISTORY — PX: COLON RESECTION: SHX5231

## 2018-05-16 LAB — POCT I-STAT 4, (NA,K, GLUC, HGB,HCT)
Glucose, Bld: 84 mg/dL (ref 70–99)
HCT: 24 % — ABNORMAL LOW (ref 39.0–52.0)
Hemoglobin: 8.2 g/dL — ABNORMAL LOW (ref 13.0–17.0)
Potassium: 3.3 mmol/L — ABNORMAL LOW (ref 3.5–5.1)
Sodium: 141 mmol/L (ref 135–145)

## 2018-05-16 LAB — HEMOGLOBIN AND HEMATOCRIT, BLOOD
HCT: 24.8 % — ABNORMAL LOW (ref 39.0–52.0)
Hemoglobin: 8.2 g/dL — ABNORMAL LOW (ref 13.0–17.0)

## 2018-05-16 LAB — GLUCOSE, CAPILLARY
Glucose-Capillary: 83 mg/dL (ref 70–99)
Glucose-Capillary: 82 mg/dL (ref 70–99)
Glucose-Capillary: 78 mg/dL (ref 70–99)

## 2018-05-16 LAB — MRSA PCR SCREENING: MRSA by PCR: NEGATIVE

## 2018-05-16 LAB — ABO/RH: ABO/RH(D): O POS

## 2018-05-16 SURGERY — LAPAROSCOPIC RIGHT COLON RESECTION
Anesthesia: General

## 2018-05-16 MED ORDER — ACETAMINOPHEN 325 MG PO TABS: 650.0000 mg | ORAL_TABLET | Freq: Four times a day (QID) | ORAL | Status: DC | PRN

## 2018-05-16 MED ORDER — SODIUM CHLORIDE 0.9 % IV SOLN
INTRAVENOUS | Status: DC | PRN
  Administered 2018-05-16: 60 mL

## 2018-05-16 MED ORDER — SODIUM CHLORIDE 0.9 % IV SOLN
INTRAVENOUS | Status: DC | PRN
  Administered 2018-05-16: 30 ug/min via INTRAVENOUS

## 2018-05-16 MED ORDER — FENTANYL CITRATE (PF) 100 MCG/2ML IJ SOLN
INTRAMUSCULAR | Status: DC | PRN
  Administered 2018-05-16: 100 ug via INTRAVENOUS
  Administered 2018-05-16 (×2): 50 ug via INTRAVENOUS

## 2018-05-16 MED ORDER — BUPIVACAINE HCL (PF) 0.5 % IJ SOLN
INTRAMUSCULAR | Status: AC
  Filled 2018-05-16: qty 30

## 2018-05-16 MED ORDER — HYDROCODONE-ACETAMINOPHEN 5-325 MG PO TABS
1.0000 | ORAL_TABLET | ORAL | Status: DC | PRN
  Administered 2018-05-16: 1 via ORAL
  Filled 2018-05-16: qty 1

## 2018-05-16 MED ORDER — SODIUM CHLORIDE 0.9 % IV SOLN: 1.5000 g | Freq: Three times a day (TID) | INTRAVENOUS | Status: DC

## 2018-05-16 MED ORDER — ROCURONIUM BROMIDE 50 MG/5ML IV SOLN
INTRAVENOUS | Status: AC
  Filled 2018-05-16: qty 1

## 2018-05-16 MED ORDER — AMLODIPINE BESYLATE 5 MG PO TABS
5.0000 mg | ORAL_TABLET | Freq: Every day | ORAL | Status: DC
  Administered 2018-05-17 – 2018-05-19 (×3): 5 mg via ORAL
  Filled 2018-05-16 (×4): qty 1

## 2018-05-16 MED ORDER — FENTANYL CITRATE (PF) 100 MCG/2ML IJ SOLN: 25.0000 ug | INTRAMUSCULAR | Status: DC | PRN

## 2018-05-16 MED ORDER — BUPIVACAINE LIPOSOME 1.3 % IJ SUSP
INTRAMUSCULAR | Status: AC
  Filled 2018-05-16: qty 20

## 2018-05-16 MED ORDER — MORPHINE SULFATE (PF) 2 MG/ML IV SOLN: 2.0000 mg | INTRAVENOUS | Status: DC | PRN

## 2018-05-16 MED ORDER — PROPOFOL 10 MG/ML IV BOLUS
INTRAVENOUS | Status: DC | PRN
  Administered 2018-05-16: 180 mg via INTRAVENOUS

## 2018-05-16 MED ORDER — HEPARIN SODIUM (PORCINE) 5000 UNIT/ML IJ SOLN
5000.0000 [IU] | Freq: Three times a day (TID) | INTRAMUSCULAR | Status: DC
  Administered 2018-05-17 – 2018-05-20 (×10): 5000 [IU] via SUBCUTANEOUS
  Filled 2018-05-16 (×10): qty 1

## 2018-05-16 MED ORDER — SODIUM CHLORIDE 0.9 % IV SOLN
INTRAVENOUS | Status: DC
  Administered 2018-05-16: 14:00:00 via INTRAVENOUS

## 2018-05-16 MED ORDER — INSULIN ASPART 100 UNIT/ML ~~LOC~~ SOLN: 0.0000 [IU] | Freq: Every day | SUBCUTANEOUS | Status: DC

## 2018-05-16 MED ORDER — ACETAMINOPHEN 500 MG PO TABS
1000.0000 mg | ORAL_TABLET | ORAL | Status: AC
  Administered 2018-05-16: 1000 mg via ORAL

## 2018-05-16 MED ORDER — MENTHOL (TOPICAL ANALGESIC) 16 % EX CREA
1.0000 "application " | TOPICAL_CREAM | Freq: Every day | CUTANEOUS | Status: DC | PRN
  Filled 2018-05-16: qty 1

## 2018-05-16 MED ORDER — LIDOCAINE HCL (PF) 2 % IJ SOLN
INTRAMUSCULAR | Status: AC
  Filled 2018-05-16: qty 10

## 2018-05-16 MED ORDER — FAMOTIDINE 20 MG PO TABS
ORAL_TABLET | ORAL | Status: AC
  Filled 2018-05-16: qty 1

## 2018-05-16 MED ORDER — ONDANSETRON HCL 4 MG/2ML IJ SOLN: 4.0000 mg | Freq: Four times a day (QID) | INTRAMUSCULAR | Status: DC | PRN

## 2018-05-16 MED ORDER — ONDANSETRON 4 MG PO TBDP: 4.0000 mg | ORAL_TABLET | Freq: Four times a day (QID) | ORAL | Status: DC | PRN

## 2018-05-16 MED ORDER — PHENYLEPHRINE HCL 10 MG/ML IJ SOLN
INTRAMUSCULAR | Status: DC | PRN
  Administered 2018-05-16: 100 ug via INTRAVENOUS
  Administered 2018-05-16 (×2): 50 ug via INTRAVENOUS

## 2018-05-16 MED ORDER — RENA-VITE PO TABS
1.0000 | ORAL_TABLET | Freq: Every day | ORAL | Status: DC
  Administered 2018-05-16 – 2018-05-20 (×5): 1 via ORAL
  Filled 2018-05-16 (×5): qty 1

## 2018-05-16 MED ORDER — BUPIVACAINE HCL (PF) 0.5 % IJ SOLN
INTRAMUSCULAR | Status: DC | PRN
  Administered 2018-05-16: 20 mL

## 2018-05-16 MED ORDER — NEOSTIGMINE METHYLSULFATE 10 MG/10ML IV SOLN
INTRAVENOUS | Status: DC | PRN
  Administered 2018-05-16: 5 mg via INTRAVENOUS

## 2018-05-16 MED ORDER — GABAPENTIN 300 MG PO CAPS
300.0000 mg | ORAL_CAPSULE | ORAL | Status: AC
  Administered 2018-05-16: 300 mg via ORAL

## 2018-05-16 MED ORDER — PROPOFOL 10 MG/ML IV BOLUS
INTRAVENOUS | Status: AC
  Filled 2018-05-16: qty 20

## 2018-05-16 MED ORDER — CELECOXIB 200 MG PO CAPS
ORAL_CAPSULE | ORAL | Status: AC
  Administered 2018-05-16: 200 mg
  Filled 2018-05-16: qty 1

## 2018-05-16 MED ORDER — SODIUM CHLORIDE FLUSH 0.9 % IV SOLN
INTRAVENOUS | Status: AC
  Filled 2018-05-16: qty 40

## 2018-05-16 MED ORDER — LIDOCAINE HCL (CARDIAC) PF 100 MG/5ML IV SOSY
PREFILLED_SYRINGE | INTRAVENOUS | Status: DC | PRN
  Administered 2018-05-16: 100 mg via INTRAVENOUS

## 2018-05-16 MED ORDER — FAMOTIDINE 20 MG PO TABS
20.0000 mg | ORAL_TABLET | Freq: Once | ORAL | Status: AC
  Administered 2018-05-16: 20 mg via ORAL

## 2018-05-16 MED ORDER — INSULIN ASPART 100 UNIT/ML ~~LOC~~ SOLN: 0.0000 [IU] | Freq: Three times a day (TID) | SUBCUTANEOUS | Status: DC

## 2018-05-16 MED ORDER — ACETAMINOPHEN 650 MG RE SUPP: 650.0000 mg | Freq: Four times a day (QID) | RECTAL | Status: DC | PRN

## 2018-05-16 MED ORDER — ROCURONIUM BROMIDE 100 MG/10ML IV SOLN
INTRAVENOUS | Status: DC | PRN
  Administered 2018-05-16: 10 mg via INTRAVENOUS
  Administered 2018-05-16: 50 mg via INTRAVENOUS
  Administered 2018-05-16: 10 mg via INTRAVENOUS

## 2018-05-16 MED ORDER — ONDANSETRON HCL 4 MG/2ML IJ SOLN: 4.0000 mg | Freq: Once | INTRAMUSCULAR | Status: DC | PRN

## 2018-05-16 MED ORDER — ACETAMINOPHEN 500 MG PO TABS
ORAL_TABLET | ORAL | Status: AC
  Filled 2018-05-16: qty 2

## 2018-05-16 MED ORDER — ONDANSETRON HCL 4 MG/2ML IJ SOLN
INTRAMUSCULAR | Status: DC | PRN
  Administered 2018-05-16: 4 mg via INTRAVENOUS

## 2018-05-16 MED ORDER — CEFAZOLIN SODIUM-DEXTROSE 2-4 GM/100ML-% IV SOLN
INTRAVENOUS | Status: AC
  Filled 2018-05-16: qty 100

## 2018-05-16 MED ORDER — SODIUM CHLORIDE 0.9 % IV SOLN
INTRAVENOUS | Status: DC
  Administered 2018-05-16: 21:00:00 via INTRAVENOUS

## 2018-05-16 MED ORDER — FERRIC CITRATE 1 GM 210 MG(FE) PO TABS
210.0000 mg | ORAL_TABLET | Freq: Every day | ORAL | Status: DC
  Administered 2018-05-16 – 2018-05-20 (×5): 210 mg via ORAL
  Filled 2018-05-16 (×5): qty 1

## 2018-05-16 MED ORDER — FENTANYL CITRATE (PF) 250 MCG/5ML IJ SOLN
INTRAMUSCULAR | Status: AC
  Filled 2018-05-16: qty 5

## 2018-05-16 MED ORDER — CHLORHEXIDINE GLUCONATE CLOTH 2 % EX PADS: 6.0000 | MEDICATED_PAD | Freq: Once | CUTANEOUS | Status: DC

## 2018-05-16 MED ORDER — GABAPENTIN 300 MG PO CAPS
ORAL_CAPSULE | ORAL | Status: AC
  Filled 2018-05-16: qty 1

## 2018-05-16 MED ORDER — CINACALCET HCL 30 MG PO TABS
30.0000 mg | ORAL_TABLET | ORAL | Status: DC
  Administered 2018-05-17 – 2018-05-20 (×4): 30 mg via ORAL
  Filled 2018-05-16 (×5): qty 1

## 2018-05-16 MED ORDER — SODIUM CHLORIDE 0.9 % IV SOLN
1.5000 g | Freq: Two times a day (BID) | INTRAVENOUS | Status: DC
  Administered 2018-05-16 – 2018-05-17 (×2): 1.5 g via INTRAVENOUS
  Filled 2018-05-16 (×4): qty 1.5

## 2018-05-16 MED ORDER — CELECOXIB 200 MG PO CAPS: 200.0000 mg | ORAL_CAPSULE | ORAL | Status: DC

## 2018-05-16 MED ORDER — GLYCOPYRROLATE 0.2 MG/ML IJ SOLN
INTRAMUSCULAR | Status: DC | PRN
  Administered 2018-05-16: .8 mg via INTRAVENOUS

## 2018-05-16 SURGICAL SUPPLY — 73 items
"PENCIL ELECTRO HAND CTR " (MISCELLANEOUS) ×1 IMPLANT
BLADE CLIPPER SURG (BLADE) ×1 IMPLANT
BLADE SURG SZ10 CARB STEEL (BLADE) ×2 IMPLANT
CANISTER SUCT 1200ML W/VALVE (MISCELLANEOUS) ×2 IMPLANT
COVER MAYO STAND STRL (DRAPES) ×1 IMPLANT
COVER WAND RF STERILE (DRAPES) ×1 IMPLANT
DECANTER SPIKE VIAL GLASS SM (MISCELLANEOUS) ×2 IMPLANT
DERMABOND ADVANCED (GAUZE/BANDAGES/DRESSINGS) ×1
DERMABOND ADVANCED .7 DNX12 (GAUZE/BANDAGES/DRESSINGS) ×2 IMPLANT
DRAPE INCISE IOBAN 66X45 STRL (DRAPES) ×2 IMPLANT
DRSG OPSITE POSTOP 4X6 (GAUZE/BANDAGES/DRESSINGS) ×2 IMPLANT
ELECT BLADE 6.5 EXT (BLADE) ×1 IMPLANT
ELECT CAUTERY BLADE 6.4 (BLADE) ×2 IMPLANT
ELECT REM PT RETURN 9FT ADLT (ELECTROSURGICAL) ×2
ELECTRODE REM PT RTRN 9FT ADLT (ELECTROSURGICAL) ×1 IMPLANT
GLOVE BIOGEL PI IND STRL 7.0 (GLOVE) ×2 IMPLANT
GLOVE BIOGEL PI INDICATOR 7.0 (GLOVE) ×6
GLOVE SURG SYN 7.0 (GLOVE) ×16 IMPLANT
GLOVE SURG SYN 7.0 PF PI (GLOVE) ×2 IMPLANT
GOWN STRL REUS W/ TWL LRG LVL3 (GOWN DISPOSABLE) ×4 IMPLANT
GOWN STRL REUS W/TWL LRG LVL3 (GOWN DISPOSABLE) ×8
HANDLE SUCTION POOLE (INSTRUMENTS) ×1 IMPLANT
HANDLE YANKAUER SUCT BULB TIP (MISCELLANEOUS) ×2 IMPLANT
HOLDER FOLEY CATH W/STRAP (MISCELLANEOUS) ×1 IMPLANT
IRRIGATION STRYKERFLOW (MISCELLANEOUS) IMPLANT
IRRIGATOR STRYKERFLOW (MISCELLANEOUS) ×2
NEEDLE HYPO 22GX1.5 SAFETY (NEEDLE) ×2 IMPLANT
NS IRRIG 1000ML POUR BTL (IV SOLUTION) ×2 IMPLANT
PACK BASIN MINOR ARMC (MISCELLANEOUS) ×1 IMPLANT
PACK COLON CLEAN CLOSURE (MISCELLANEOUS) ×1 IMPLANT
PACK LAP CHOLECYSTECTOMY (MISCELLANEOUS) ×2 IMPLANT
PENCIL ELECTRO HAND CTR (MISCELLANEOUS) ×2 IMPLANT
PORT ACCESS TROCAR AIRSEAL 5 (TROCAR) ×1 IMPLANT
RELOAD PROXIMATE 75MM BLUE (ENDOMECHANICALS) ×4 IMPLANT
RELOAD STAPLE 60 2.6 WHT THN (STAPLE) ×2 IMPLANT
RELOAD STAPLE 60 3.6 BLU REG (STAPLE) ×2 IMPLANT
RELOAD STAPLE 75 3.8 BLU REG (ENDOMECHANICALS) IMPLANT
RELOAD STAPLER BLUE 60MM (STAPLE) IMPLANT
RELOAD STAPLER WHITE 60MM (STAPLE) IMPLANT
RETRACTOR WOUND ALXS 18CM MED (MISCELLANEOUS) ×1 IMPLANT
RTRCTR WOUND ALEXIS O 18CM MED (MISCELLANEOUS)
SET TRI-LUMEN FLTR TB AIRSEAL (TUBING) ×1 IMPLANT
SHEARS HARMONIC ACE PLUS 36CM (ENDOMECHANICALS) ×2 IMPLANT
SLEEVE ENDOPATH XCEL 5M (ENDOMECHANICALS) ×1 IMPLANT
SLEEVE PROTECTION STRL DISP (MISCELLANEOUS) ×1 IMPLANT
SPONGE LAP 18X18 RF (DISPOSABLE) ×4 IMPLANT
STAPLE ECHEON FLEX 60 POW ENDO (STAPLE) ×1 IMPLANT
STAPLER GUN LINEAR PROX 60 (STAPLE) ×1 IMPLANT
STAPLER PROXIMATE 75MM BLUE (STAPLE) ×1 IMPLANT
STAPLER RELOAD BLUE 60MM (STAPLE)
STAPLER RELOAD WHITE 60MM (STAPLE)
STAPLER SKIN PROX 35W (STAPLE) ×1 IMPLANT
SUCTION POOLE HANDLE (INSTRUMENTS) ×2
SUT MNCRL 4-0 (SUTURE) ×2
SUT MNCRL 4-0 27XMFL (SUTURE) ×2
SUT PDS AB 0 CT1 27 (SUTURE) ×4 IMPLANT
SUT SILK 2 0 (SUTURE) ×1
SUT SILK 2 0 SH CR/8 (SUTURE) ×2 IMPLANT
SUT SILK 2-0 30XBRD TIE 12 (SUTURE) ×1 IMPLANT
SUT VIC AB 2-0 SH 27 (SUTURE) ×1
SUT VIC AB 2-0 SH 27XBRD (SUTURE) IMPLANT
SUT VIC AB 3-0 SH 27 (SUTURE) ×1
SUT VIC AB 3-0 SH 27X BRD (SUTURE) IMPLANT
SUT VICRYL 0 AB UR-6 (SUTURE) ×2 IMPLANT
SUTURE MNCRL 4-0 27XMF (SUTURE) ×2 IMPLANT
SYR 30ML LL (SYRINGE) ×2 IMPLANT
SYS LAPSCP GELPORT 120MM (MISCELLANEOUS) ×2
SYSTEM LAPSCP GELPORT 120MM (MISCELLANEOUS) ×1 IMPLANT
TRAY FOLEY MTR SLVR 16FR STAT (SET/KITS/TRAYS/PACK) ×1 IMPLANT
TROCAR XCEL 12X100 BLDLESS (ENDOMECHANICALS) ×1 IMPLANT
TROCAR XCEL BLUNT TIP 100MML (ENDOMECHANICALS) ×1 IMPLANT
TROCAR XCEL NON-BLD 5MMX100MML (ENDOMECHANICALS) ×2 IMPLANT
TUBING INSUF HEATED (TUBING) ×2 IMPLANT

## 2018-05-16 NOTE — Transfer of Care (Signed)
Immediate Anesthesia Transfer of Care Note  [name]  Procedure(s) Performed: LAPAROSCOPIC  COLON RESECTION (N/A )  Patient Location: PACU  Anesthesia Type:General  Level of Consciousness: sedated and drowsy  Airway & Oxygen Therapy: Patient Spontanous Breathing and Patient connected to face mask oxygen  Post-op Assessment: Report given to RN and Post -op Vital signs reviewed and stable  Post vital signs: Reviewed and stable  Last Vitals:  Vitals Value Taken Time  BP 140/61 05/16/2018  4:54 PM  Temp 36.8 C 05/16/2018  4:54 PM  Pulse 74 05/16/2018  5:00 PM  Resp 10 05/16/2018  5:00 PM  SpO2 89 % 05/16/2018  5:00 PM  Vitals shown include unvalidated device data.  Last Pain:  Vitals:   05/16/18 1654  TempSrc:   PainSc: (P) Asleep         Complications: No apparent anesthesia complications

## 2018-05-16 NOTE — Anesthesia Preprocedure Evaluation (Addendum)
Anesthesia Evaluation  Patient identified by MRN, date of birth, ID band Patient awake    Reviewed: Allergy & Precautions, NPO status , Patient's Chart, lab work & pertinent test results  History of Anesthesia Complications Negative for: history of anesthetic complications  Airway Mallampati: III  TM Distance: >3 FB Neck ROM: Full    Dental  (+) Partial Upper   Pulmonary pneumonia, resolved,    Pulmonary exam normal breath sounds clear to auscultation       Cardiovascular Exercise Tolerance: Good hypertension, Pt. on medications negative cardio ROS Normal cardiovascular exam Rhythm:Regular Rate:Normal     Neuro/Psych negative neurological ROS  negative psych ROS   GI/Hepatic negative GI ROS, Neg liver ROS,   Endo/Other  negative endocrine ROSdiabetes  Renal/GU CRFRenal disease  negative genitourinary   Musculoskeletal negative musculoskeletal ROS (+)   Abdominal   Peds negative pediatric ROS (+)  Hematology  (+) anemia ,   Anesthesia Other Findings   Reproductive/Obstetrics negative OB ROS                             Anesthesia Physical  Anesthesia Plan  ASA: III  Anesthesia Plan: General   Post-op Pain Management:    Induction: Intravenous  PONV Risk Score and Plan:   Airway Management Planned: Oral ETT  Additional Equipment:   Intra-op Plan:   Post-operative Plan: Extubation in OR  Informed Consent: I have reviewed the patients History and Physical, chart, labs and discussed the procedure including the risks, benefits and alternatives for the proposed anesthesia with the patient or authorized representative who has indicated his/her understanding and acceptance.   Dental advisory given  Plan Discussed with: CRNA and Surgeon  Anesthesia Plan Comments:         Anesthesia Quick Evaluation

## 2018-05-16 NOTE — Anesthesia Procedure Notes (Signed)
Procedure Name: Intubation Performed by: Lance Muss, CRNA Pre-anesthesia Checklist: Patient identified, Patient being monitored, Timeout performed, Emergency Drugs available and Suction available Patient Re-evaluated:Patient Re-evaluated prior to induction Oxygen Delivery Method: Circle system utilized Preoxygenation: Pre-oxygenation with 100% oxygen Induction Type: IV induction Ventilation: Mask ventilation without difficulty Laryngoscope Size: 4 and McGraph Grade View: Grade II Tube type: Oral Tube size: 7.5 mm Number of attempts: 2 Airway Equipment and Method: Stylet and Video-laryngoscopy Placement Confirmation: ETT inserted through vocal cords under direct vision,  positive ETCO2 and breath sounds checked- equal and bilateral Secured at: 23 cm Tube secured with: Tape Dental Injury: Teeth and Oropharynx as per pre-operative assessment  Difficulty Due To: Difficult Airway- due to anterior larynx Future Recommendations: Recommend- induction with short-acting agent, and alternative techniques readily available Comments: Attempted first with MAC 4, unable to view cords, anterior airway. Second attempt with Mcgraph 4, successfully intubated. +ETCO2, +BBS.

## 2018-05-16 NOTE — Anesthesia Post-op Follow-up Note (Signed)
Anesthesia QCDR form completed.        

## 2018-05-16 NOTE — Interval H&P Note (Signed)
History and Physical Interval Note:  05/16/2018 1:09 PM  Jonathan Mcgrath  has presented today for surgery, with the diagnosis of malignant neoplasm of ascending colon  The various methods of treatment have been discussed with the patient and family. After consideration of risks, benefits and other options for treatment, the patient has consented to  Procedure(s): LAPAROSCOPIC  COLON RESECTION (N/A) as a surgical intervention .  The patient's history has been reviewed, patient examined, no change in status, stable for surgery.  I have reviewed the patient's chart and labs.  Questions were answered to the patient's satisfaction.     [name] [name]

## 2018-05-16 NOTE — Anesthesia Postprocedure Evaluation (Signed)
Anesthesia Post Note  [name]  Procedure(s) Performed: LAPAROSCOPIC  COLON RESECTION (N/A )  Patient location during evaluation: PACU Anesthesia Type: General Level of consciousness: awake and alert Pain management: pain level controlled Vital Signs Assessment: post-procedure vital signs reviewed and stable Respiratory status: spontaneous breathing, nonlabored ventilation, respiratory function stable and patient connected to nasal cannula oxygen Cardiovascular status: blood pressure returned to baseline and stable Postop Assessment: no apparent nausea or vomiting Anesthetic complications: no     Last Vitals:  Vitals:   05/16/18 1842 05/16/18 1913  BP: (!) 132/55 (!) 141/60  Pulse: 62 (!) 58  Resp: 16 15  Temp: 37 C 36.7 C  SpO2: 98% 94%    Last Pain:  Vitals:   05/16/18 1913  TempSrc: Oral  PainSc:                  Martha Clan

## 2018-05-16 NOTE — Progress Notes (Signed)
PHARMACY NOTE:  ANTIMICROBIAL RENAL DOSAGE ADJUSTMENT  Current antimicrobial regimen includes a mismatch between antimicrobial dosage and estimated renal function.  As per policy approved by the Pharmacy & Therapeutics and Medical Executive Committees, the antimicrobial dosage will be adjusted accordingly.  Current antimicrobial dosage:  unasyn 1.5g q 8 hr  Indication: aortitis  Renal Function:  Estimated Creatinine Clearance: 9.9 mL/min (A) (by C-G formula based on SCr of 7.03 mg/dL (H)). []      On intermittent HD, scheduled: []      On CRRT    Antimicrobial dosage has been changed to:  unasyn 1.5g q 12hr  Additional comments:   Thank you for allowing pharmacy to be a part of this patient's care.  Ramond Dial, Pharm.D, BCPS Clinical Pharmacist  05/16/2018 6:55 PM

## 2018-05-17 ENCOUNTER — Encounter: Payer: Self-pay | Admitting: General Surgery

## 2018-05-17 LAB — RENAL FUNCTION PANEL
Albumin: 2.6 g/dL — ABNORMAL LOW (ref 3.5–5.0)
Anion gap: 9 (ref 5–15)
BUN: 21 mg/dL (ref 8–23)
CO2: 28 mmol/L (ref 22–32)
Calcium: 7.1 mg/dL — ABNORMAL LOW (ref 8.9–10.3)
Chloride: 101 mmol/L (ref 98–111)
Creatinine, Ser: 7.37 mg/dL — ABNORMAL HIGH (ref 0.61–1.24)
GFR calc Af Amer: 7 mL/min — ABNORMAL LOW (ref >60)
GFR calc non Af Amer: 6 mL/min — ABNORMAL LOW (ref >60)
Glucose, Bld: 89 mg/dL (ref 70–99)
Phosphorus: 5.3 mg/dL — ABNORMAL HIGH (ref 2.5–4.6)
Potassium: 3.8 mmol/L (ref 3.5–5.1)
Sodium: 138 mmol/L (ref 135–145)

## 2018-05-17 LAB — CBC
HCT: 24.2 % — ABNORMAL LOW (ref 39.0–52.0)
Hemoglobin: 7.7 g/dL — ABNORMAL LOW (ref 13.0–17.0)
MCH: 29.5 pg (ref 26.0–34.0)
MCHC: 31.8 g/dL (ref 30.0–36.0)
MCV: 92.7 fL (ref 80.0–100.0)
Platelets: 133 10*3/uL — ABNORMAL LOW (ref 150–400)
RBC: 2.61 MIL/uL — ABNORMAL LOW (ref 4.22–5.81)
RDW: 15 % (ref 11.5–15.5)
WBC: 11.8 10*3/uL — ABNORMAL HIGH (ref 4.0–10.5)
nRBC: 0 % (ref 0.0–0.2)

## 2018-05-17 LAB — GLUCOSE, CAPILLARY
Glucose-Capillary: 72 mg/dL (ref 70–99)
Glucose-Capillary: 72 mg/dL (ref 70–99)
Glucose-Capillary: 72 mg/dL (ref 70–99)
Glucose-Capillary: 77 mg/dL (ref 70–99)

## 2018-05-17 MED ORDER — SODIUM CHLORIDE 0.9 % IV SOLN
3.0000 g | Freq: Two times a day (BID) | INTRAVENOUS | Status: DC
  Administered 2018-05-17 – 2018-05-19 (×4): 3 g via INTRAVENOUS
  Filled 2018-05-17 (×5): qty 3

## 2018-05-17 MED ORDER — PHENOL 1.4 % MT LIQD
1.0000 | OROMUCOSAL | Status: DC | PRN
  Administered 2018-05-17: 1 via OROMUCOSAL
  Filled 2018-05-17: qty 177

## 2018-05-17 MED ORDER — CHLORHEXIDINE GLUCONATE CLOTH 2 % EX PADS
6.0000 | MEDICATED_PAD | Freq: Every day | CUTANEOUS | Status: DC
  Administered 2018-05-17 – 2018-05-19 (×3): 6 via TOPICAL

## 2018-05-17 NOTE — Progress Notes (Signed)
Patient transported to dialysis

## 2018-05-17 NOTE — Progress Notes (Signed)
Hd started  

## 2018-05-17 NOTE — Care Management (Signed)
Patient open with [facility] for RN.  Corene Cornea with [facility] aware of admission.  Elvera Bicker dialysis liaison notified of admission.

## 2018-05-17 NOTE — Progress Notes (Signed)
This note also relates to the following rows which could not be included: Pulse Rate - Cannot attach notes to unvalidated device data Resp - Cannot attach notes to unvalidated device data BP - Cannot attach notes to unvalidated device data  Hd completed  

## 2018-05-17 NOTE — Consult Note (Signed)
NAME: Jonathan Mcgrath  DOB: [birth year]  MRN: [id number]  Date/Time: 05/17/2018 4:59 PM Subjective:  REASON FOR CONSULT: recent clostridium septicum bacteremia ptis admitted for colectomy  ? Jonathan Mcgrath is a 77 y.o. male with a history of end-stage renal disease on dialysis, hypertension was recently in [facility] with diarrhea and vomiting and was diagnosed with clostridium septicum bacteremia which led to colonoscopy on 04/18/2018 which showed multiple polyps and a colon mass in the cecum.  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  Patient was initially treated with IV Unasyn and was discharged home on Unasyn.  But unfortunately because of twice a day dose and his son not able to give him the morning doses because of work and dialysis schedule Unasyn was not continued and he was changed to vancomycin that he is getting during dialysis and p.o. Flagyl.  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 No contrast extravasation. 2. Persistent though reduced perivascular stranding surrounding the distal abdominal aorta and left common iliac artery. He underwent lap colectomy with resection of the mass and  Primary anastomosis. He is stable and doing okay Past Medical History:  Diagnosis Date  . Cancer ([place]) 2019   colon  . Chronic kidney disease   . Diabetes mellitus without complication (HCC)    diet controlled  . Hemodialysis access site with arteriovenous graft ([facility])   . Hypertension   . Renal insufficiency    Pt on dialysis x 3 years and receives every Monday, Wednesday and Friday.     Past Surgical History:  Procedure Laterality Date  . AV FISTULA PLACEMENT Left 2015   arm  . CATARACT EXTRACTION W/ INTRAOCULAR LENS  IMPLANT, BILATERAL  Bilateral 08/2017  . CENTRAL LINE INSERTION-TUNNELED N/A 04/19/2018   Procedure: CENTRAL LINE INSERTION-TUNNELED;  Surgeon: Delana Meyer Dolores Lory, MD;  Location: [facility];  Service: Cardiovascular;  Laterality: N/A;  . COLON RESECTION N/A 05/16/2018   Procedure: LAPAROSCOPIC  COLON RESECTION;  Surgeon: Herbert Pun, MD;  Location: [facility] ORS;  Service: General;  Laterality: N/A;  . COLONOSCOPY N/A 04/18/2018   Procedure: COLONOSCOPY;  Surgeon: Virgel Manifold, MD;  Location: [facility] ENDOSCOPY;  Service: Endoscopy;  Laterality: N/A;  . ESOPHAGOGASTRODUODENOSCOPY N/A 04/18/2018   Procedure: ESOPHAGOGASTRODUODENOSCOPY (EGD);  Surgeon: Virgel Manifold, MD;  Location: [facility] ENDOSCOPY;  Service: Endoscopy;  Laterality: N/A;  . EYE SURGERY    . PERIPHERAL VASCULAR CATHETERIZATION Left 01/12/2016   Procedure: A/V Shuntogram/Fistulagram;  Surgeon: Algernon Huxley, MD;  Location: [facility] CV LAB;  Service: Cardiovascular;  Laterality: Left;  . PERIPHERAL VASCULAR CATHETERIZATION N/A 01/12/2016   Procedure: A/V Shunt Intervention;  Surgeon: Algernon Huxley, MD;  Location: [facility];  Service: Cardiovascular;  Laterality: N/A;  . PERITONEAL CATHETER INSERTION N/A   . REMOVAL OF A DIALYSIS CATHETER N/A 01/14/2015   Procedure: REMOVAL OF A  PERITONEAL DIALYSIS CATHETER;  Surgeon: Algernon Huxley, MD;  Location: [facility] ORS;  Service: Vascular;  Laterality: N/A;  . TEE WITHOUT CARDIOVERSION N/A 04/19/2018   Procedure: TRANSESOPHAGEAL ECHOCARDIOGRAM (TEE);  Surgeon: Teodoro Spray, MD;  Location: [facility] ORS;  Service: Cardiovascular;  Laterality: N/A;    Sh Lives with his son Non smoker  Family History  Problem Relation Age of Onset  . Diabetes Mother   . Heart disease Father    No Known Allergies  ? Current Facility-Administered Medications  Medication Dose Route Frequency Provider Last Rate Last Dose  . acetaminophen (TYLENOL) tablet 650 mg  650 mg Oral Q6H PRN  [name], Isami, DO       Or  . acetaminophen (TYLENOL) suppository 650 mg  650 mg Rectal Q6H PRN [name], Isami, DO      . amLODipine (NORVASC) tablet 5 mg  5 mg Oral Daily Sakai, Isami, DO   5 mg at 05/17/18 1654  . Ampicillin-Sulbactam (UNASYN) 3 g in sodium chloride 0.9 % 100 mL IVPB  3 g Intravenous Q12H Matteus Mcnelly, Joellyn Quails, MD      . Chlorhexidine Gluconate Cloth 2 % PADS 6 each  6 each Topical Q0600 Lavonia Dana, MD   6 each at 05/17/18 0527  . cinacalcet (SENSIPAR) tablet 30 mg  30 mg Oral BH-q7a Sakai, Isami, DO   30 mg at 05/17/18 0820  . ferric citrate (AURYXIA) tablet 210 mg  210 mg Oral Daily Sakai, Isami, DO   210 mg at 05/17/18 0820  . heparin injection 5,000 Units  5,000 Units Subcutaneous Q8H Sakai, Isami, DO   5,000 Units at 05/17/18 1654  . HYDROcodone-acetaminophen (NORCO/VICODIN) 5-325 MG per tablet 1-2 tablet  1-2 tablet Oral Q4H PRN [name], Isami, DO   1 tablet at 05/16/18 2058  . insulin aspart (novoLOG) injection 0-5 Units  0-5 Units Subcutaneous QHS Herbert Pun, MD      . insulin aspart (novoLOG) injection 0-9 Units  0-9 Units Subcutaneous TID WC Herbert Pun, MD      . Menthol (Topical Analgesic) 16 % CREA 1 application  1 application Topical Daily PRN Sakai, Isami, DO      . morphine 2 MG/ML injection 2 mg  2 mg Intravenous Q4H PRN Sakai, Isami, DO      . multivitamin (RENA-VIT) tablet 1 tablet  1 tablet Oral Daily Sakai, Isami, DO   1 tablet at 05/17/18 0819  . ondansetron (ZOFRAN-ODT) disintegrating tablet 4 mg  4 mg Oral Q6H PRN [name], Isami, DO       Or  . ondansetron (ZOFRAN) injection 4 mg  4 mg Intravenous Q6H PRN Sakai, Isami, DO      . phenol (CHLORASEPTIC) mouth spray 1 spray  1 spray Mouth/Throat PRN [name], Isami, DO   1 spray at 05/17/18 0701     Abtx:  Anti-infectives (From admission, onward)   Start     Dose/Rate Route Frequency Ordered Stop   05/17/18 1900  Ampicillin-Sulbactam (UNASYN) 3 g in sodium chloride 0.9 % 100 mL IVPB      3 g 200 mL/hr over 30 Minutes Intravenous Every 12 hours 05/17/18 1659     05/16/18 1900  ampicillin-sulbactam (UNASYN) 1.5 g in sodium chloride 0.9 % 100 mL IVPB  Status:  Discontinued     1.5 g 200 mL/hr over 30 Minutes Intravenous Every 8 hours 05/16/18 1845 05/16/18 1855   05/16/18 1900  ampicillin-sulbactam (UNASYN) 1.5 g in sodium chloride 0.9 % 100 mL IVPB  Status:  Discontinued     1.5 g 200 mL/hr over 30 Minutes Intravenous Every 12 hours 05/16/18 1855 05/17/18 1659   05/16/18 1243  ceFAZolin (ANCEF) 2-4 GM/100ML-% IVPB    Note to Pharmacy:  Sylvester Harder   : cabinet override      05/16/18 1243 05/16/18 1350   05/16/18 0600  ceFAZolin (  ANCEF) IVPB 2g/100 mL premix     2 g 200 mL/hr over 30 Minutes Intravenous On call to O.R. 05/15/18 2217 05/16/18 1420   05/16/18 0600  metroNIDAZOLE (FLAGYL) IVPB 500 mg     500 mg 100 mL/hr over 60 Minutes Intravenous On call to O.R. 05/15/18 2217 05/16/18 1433      REVIEW OF SYSTEMS:  Const: negative fever, negative chills, negative weight loss Eyes: negative diplopia or visual changes, negative eye pain ENT: negative coryza, negative sore throat Resp: negative cough, hemoptysis, dyspnea Cards: negative for chest pain, palpitations, lower extremity edema GU: negative for frequency, dysuria and hematuria GI: Negative for abdominal pain, diarrhea, bleeding, constipation Skin: negative for rash and pruritus Heme: negative for easy bruising and gum/nose bleeding MS: negative for myalgias, arthralgias, back pain and muscle weakness Neurolo:negative for headaches, dizziness, vertigo, memory problems  Psych: negative for feelings of anxiety, depression  Endocrine: negative for thyroid, diabetes Allergy/Immunology- negative for any medication or food allergies ?: Objective:  VITALS:  BP (!) 145/63 (BP Location: Right Arm)   Pulse 71   Temp 98.1 F (36.7 C)   Resp 15   Ht 6\' 1"  (1.854 m)   Wt 93.4 kg   SpO2 92%   BMI 27.17 kg/m   PHYSICAL EXAM:  General: Alert, cooperative, no distress, appears stated age.  Head: Normocephalic, without obvious abnormality, atraumatic. Eyes: Conjunctivae clear, anicteric sclerae. Pupils are equal ENT Nares normal. No drainage or sinus tenderness. Lips, mucosa, and tongue normal. No Thrush Neck: Supple, symmetrical, no adenopathy, thyroid: non tender no carotid bruit and no JVD. Back: No CVA tenderness. Chest line Left fistula Lungs: Clear to auscultation bilaterally. No Wheezing or Rhonchi. No rales. Heart: Regular rate and rhythm, no murmur, rub or gallop. Abdomen: Soft, surgical site covered with dressing non-tender,not distended. Bowel sounds normal. No masses Extremities: atraumatic, no cyanosis. No edema. No clubbing Skin: No rashes or lesions. Or bruising Lymph: Cervical, supraclavicular normal. Neurologic: Grossly non-focal Pertinent Labs Lab Results CBC    Component Value Date/Time   WBC 11.8 (H) 05/17/2018 0509   RBC 2.61 (L) 05/17/2018 0509   HGB 7.7 (L) 05/17/2018 0509   HGB 10.6 (L) 04/12/2014 0433   HCT 24.2 (L) 05/17/2018 0509   HCT 33.5 (L) 04/12/2014 0433   PLT 133 (L) 05/17/2018 0509   PLT 123 (L) 04/12/2014 0433   MCV 92.7 05/17/2018 0509   MCV 94 04/12/2014 0433   MCH 29.5 05/17/2018 0509   MCHC 31.8 05/17/2018 0509   RDW 15.0 05/17/2018 0509   RDW 19.2 (H) 04/12/2014 0433   LYMPHSABS 8.2 (H) 05/14/2018 1100   LYMPHSABS 11.8 (H) 04/11/2014 1951   MONOABS 0.7 05/14/2018 1100   MONOABS 1.2 (H) 04/11/2014 1951   EOSABS 0.1 05/14/2018 1100   EOSABS 0.1 04/11/2014 1951   BASOSABS 0.1 05/14/2018 1100   BASOSABS 0.2 (H) 04/11/2014 1951    CMP Latest Ref Rng & Units 05/17/2018 05/16/2018 05/14/2018  Glucose 70 - 99 mg/dL 89 84 136(H)  BUN 8 - 23 mg/dL 21 - 26(H)  Creatinine 0.61 - 1.24 mg/dL 7.37(H) - 7.03(H)  Sodium 135 - 145 mmol/L 138 141 142  Potassium 3.5 - 5.1 mmol/L 3.8 3.3(L) 3.5  Chloride 98 - 111 mmol/L 101 - 97(L)  CO2 22 - 32  mmol/L 28 - 33(H)  Calcium 8.9 - 10.3 mg/dL 7.1(L) - 7.6(L)  Total Protein 6.5 - 8.1 g/dL - - -  Total Bilirubin 0.3 - 1.2 mg/dL - - -  Alkaline Phos 38 - 126 U/L - - -  AST 15 - 41 U/L - - -  ALT 0 - 44 U/L - - -      Microbiology: Recent Results (from the past 240 hour(s))  MRSA PCR Screening     Status: None   Collection Time: 05/16/18  6:56 PM  Result Value Ref Range Status   MRSA by PCR NEGATIVE NEGATIVE Final    Comment:        The GeneXpert MRSA Assay (FDA approved for NASAL specimens only), is one component of a comprehensive MRSA colonization surveillance program. It is not intended to diagnose MRSA infection nor to guide or monitor treatment for MRSA infections. 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 No contrast extravasation. 2. Persistent though reduced perivascular stranding surrounding the distal abdominal aorta and left common iliac artery. Impression/Recommendation ?77 y.o. male with a history of end-stage renal disease on dialysis, hypertension was recently in [facility] with diarrhea and vomiting and was diagnosed with clostridium septicum bacteremia                         ? 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 Is being treated as endovascular infection-  __will need IV unasyn until 9/28 and then will go on PO augmentin. unasyn better tan vancomycin- will have ot make sure/check tht he could do unasyn on discharge at home as previously was missing doses and had to be put on vancomycin  ESRD on dilaysis  _________________________________________________ Discussed with patient and Dr.Sakai

## 2018-05-17 NOTE — Op Note (Signed)
Preoperative diagnosis: Carcinoma of ascending colon  Postoperative diagnosis: Carcinoma of ascending colon  Procedure: Laparoscopic right extended hemicolectomy with primary anastomosis.   Anesthesia: GETA  Surgeon: [name] Assistant: Cintron-Diaz  Wound Classification: Clean Contaminated  Indications: Patient is a 77 y.o. male with  carcinoma involving the ascending colon. Resection was indicated for oncologic treatment.   Specimen: Right Colon  Complications: None  Estimated Blood Loss:  75 mL  Findings: 1. Palpable mass at the cecum 2. Tension free anastomosis 3. Adequate gross circulation on anastomosis 4. No gross metastatic disease identified 5. Adequate hemostasis  Details of operation: The patient was brought to the operating room and placed on the operating table in the supine position. The abdomen was clipped, prepped with 2% chlorhexidine solution, and draped in the standard fashion. A time-out was completed verifying correct patient, procedure, site, positioning, and implant(s) and/or special equipment prior to beginning this procedure. A vertical 5 cm periumbilical incision was used to access the peritoneal cavity and a Gelport was placed.  Under direct visualization a 53mm subxyphoid trocar was introduced and pneumoperitoneum was achieved. Another 5 mm trocar was introduced on the LUQ area. The patient was then positioned in 30 reverse Trendelenburg position with the patient's right side up to allow for small bowel to drop clear of the operative field. The 5-mm scope was introduced from the epigastric port at the initial part of the procedure. With a hand assisted technique, the transverse colon was grasped with the right hand of the operating surgeon and lifted up to tent the mesentary and omentum.  The dissection was then started at the area distal to palpable middle colic artery.  Once omentum was dissected off, the lateral attachments around the hepatic flexure down along  the white line of toldt was taken down using the harmonic.  Once the right colon was mobilized and pulled through the hand port, a hole was made in the mesentary where the omental dissection begun.  Transverse colon then divided using GIA vascular load.  Point of transaction was distal to the middle colic artery. The mesentary was then divided using harmonic and suture ligation of the palpable vessels with 3-0 silk.  Care was noted to not injure the duodenum during this portion.   Dissection then continued on the right side of the superior mesenteric artery leading to the identification of the right colic artery and vein which were then gently dissected and suture ligated as well.   The ileum was divided at least 71IR from the ileocolic valve and using a GIA device. The mesenteric dissection was completed as needed on the small bowel and colonic sides and the specimen resection was thus completed. The specimen was removed. The two ends of the ileum and transverse colon were then aligned, ensuring that the mesentery was not twisted, and a stapled end-to-end anastomosis was created in the usual manner using 24mm stapler.  The new staple line anastamosis was lembert sutured. The mesenteric defect was then closed with Vicryl 2-0 sutures and the bowel reintroduced into the peritoneal cavity.  No bleeding or injury noted with one last inspection laparoscopically and no gross pathology or mets noted before the hand port and trocars removed.  Gloves and instruments were switched out and the fascia was closed with running PDS 0 x2.   Exparel infused to incisions prior to closing.  3-0 vicryl used to close subcutaneous layer at the hand port incision prior to closing  with stapler.  Port sites closed with 4-0 monocryl  and dermabond.  Wounds then dressed with honeycomb sponge.  The sponge and instrument count correct at end of procedure.  The patient tolerated the procedure well and was transferred to the post-anesthesia  care unit in stable condition.

## 2018-05-17 NOTE — Progress Notes (Signed)
[facility]  ROUNDING NOTE   Subjective:   [name] admitted to [facility] on 05/16/2018 for malignant neoplasm of ascending colon   Surgery yesterday by Dr. [name]. Laparoscopic colon resection.   Objective:  Vital signs in last 24 hours:  Temp:  [97.4 F (36.3 C)-98.6 F (37 C)] 97.4 F (36.3 C) (11/01 0519) Pulse Rate:  [58-76] 69 (11/01 0519) Resp:  [13-20] 18 (11/01 0519) BP: (119-155)/(45-68) 144/59 (11/01 0519) SpO2:  [93 %-99 %] 93 % (11/01 0519) Weight:  [92.1 kg] 92.1 kg (10/31 1240)  Weight change:  Filed Weights   05/16/18 1240  Weight: 92.1 kg    Intake/Output: I/O last 3 completed shifts: In: 865.4 [P.O.:118; I.V.:547.4; IV Piggyback:200] Out: 50 [Blood:50]   Intake/Output this shift:  Total I/O In: 700 [P.O.:700] Out: -   Physical Exam: General: NAD,   Head: Normocephalic, atraumatic. Moist oral mucosal membranes  Eyes: Anicteric, PERRL  Neck: Supple, trachea midline  Lungs:  Clear to auscultation  Heart: Regular rate and rhythm  Abdomen:  Soft, nontender, +midline incision  Extremities:  no  peripheral edema.  Neurologic: Nonfocal, moving all four extremities  Skin: No lesions  Access: Left AVF    Basic Metabolic Panel: Recent Labs  Lab 05/14/18 1100 05/16/18 1313  NA 142 141  K 3.5 3.3*  CL 97*  --   CO2 33*  --   GLUCOSE 136* 84  BUN 26*  --   CREATININE 7.03*  --   CALCIUM 7.6*  --     Liver Function Tests: No results for input(s): AST, ALT, ALKPHOS, BILITOT, PROT, ALBUMIN in the last 168 hours. No results for input(s): LIPASE, AMYLASE in the last 168 hours. No results for input(s): AMMONIA in the last 168 hours.  CBC: Recent Labs  Lab 05/14/18 1100 05/16/18 1248 05/16/18 1313 05/17/18 0509  WBC 12.0*  --   --  11.8*  NEUTROABS 3.0  --   --   --   HGB 8.3* 8.2* 8.2* 7.7*  HCT 25.8* 24.8* 24.0* 24.2*  MCV 91.2  --   --  92.7  PLT 148*  --   --  133*    Cardiac Enzymes: No results for  input(s): CKTOTAL, CKMB, CKMBINDEX, TROPONINI in the last 168 hours.  BNP: Invalid input(s): POCBNP  CBG: Recent Labs  Lab 05/16/18 1247 05/16/18 1659 05/16/18 2143 05/17/18 0720  GLUCAP 83 82 78 72    Microbiology: Results for orders placed or performed during the hospital encounter of 05/16/18  MRSA PCR Screening     Status: None   Collection Time: 05/16/18  6:56 PM  Result Value Ref Range Status   MRSA by PCR NEGATIVE NEGATIVE Final    Comment:        The GeneXpert MRSA Assay (FDA approved for NASAL specimens only), is one component of a comprehensive MRSA colonization surveillance program. It is not intended to diagnose MRSA infection nor to guide or monitor treatment for MRSA infections. Performed at [facility], [address] [place], [place] [address]     Coagulation Studies: No results for input(s): LABPROT, INR in the last 72 hours.  Urinalysis: No results for input(s): COLORURINE, LABSPEC, PHURINE, GLUCOSEU, HGBUR, BILIRUBINUR, KETONESUR, PROTEINUR, UROBILINOGEN, NITRITE, LEUKOCYTESUR in the last 72 hours.  Invalid input(s): APPERANCEUR    Imaging: No results found.   Medications:   . ampicillin-sulbactam (UNASYN) IV 1.5 g (05/17/18 0823)   . amLODipine  5 mg Oral Daily  . Chlorhexidine  Gluconate Cloth  6 each Topical [id number]  . cinacalcet  30 mg Oral BH-q7a  . ferric citrate  210 mg Oral Daily  . heparin  5,000 Units Subcutaneous Q8H  . insulin aspart  0-5 Units Subcutaneous QHS  . insulin aspart  0-9 Units Subcutaneous TID WC  . multivitamin  1 tablet Oral Daily   acetaminophen **OR** acetaminophen, HYDROcodone-acetaminophen, Menthol (Topical Analgesic), morphine injection, ondansetron **OR** ondansetron (ZOFRAN) IV, phenol  Assessment/ Plan:  [name] is a 77 y.o. black male with end stage renal disease on hemodialysis,  hypertension, diabetes mellitus, GERD, anemia of CKD, SHPTH, colon cancer status post resection  on 10/31 by Dr. [name]  [facility]/MWF/L AVF/92kg  1.  End-stage renal disease: MWF Hemodialysis for later today.   2.  Anemia of CKD: hemoglobin 7.7 - Hold EPO  3.  Secondary hyperparathyroidism: outpatient PTH 704, calcium and phosphorus at goal.  - Restart cinacalcet and Auryxia when appropriate.   4. Hypertension: blood pressure at goal. Home regimen of losartan, carvedilol and furosemide.  On amlodipine.     LOS: 1 Dorman Calderwood 11/1/201911:48 AM

## 2018-05-18 LAB — CBC
HCT: 24.5 % — ABNORMAL LOW (ref 39.0–52.0)
Hemoglobin: 8 g/dL — ABNORMAL LOW (ref 13.0–17.0)
MCH: 30.3 pg (ref 26.0–34.0)
MCHC: 32.7 g/dL (ref 30.0–36.0)
MCV: 92.8 fL (ref 80.0–100.0)
Platelets: 136 10*3/uL — ABNORMAL LOW (ref 150–400)
RBC: 2.64 MIL/uL — ABNORMAL LOW (ref 4.22–5.81)
RDW: 15.2 % (ref 11.5–15.5)
WBC: 11.4 10*3/uL — ABNORMAL HIGH (ref 4.0–10.5)
nRBC: 0 % (ref 0.0–0.2)

## 2018-05-18 LAB — BASIC METABOLIC PANEL
Anion gap: 9 (ref 5–15)
BUN: 13 mg/dL (ref 8–23)
CO2: 29 mmol/L (ref 22–32)
Calcium: 7.7 mg/dL — ABNORMAL LOW (ref 8.9–10.3)
Chloride: 101 mmol/L (ref 98–111)
Creatinine, Ser: 5.67 mg/dL — ABNORMAL HIGH (ref 0.61–1.24)
GFR calc Af Amer: 10 mL/min — ABNORMAL LOW (ref >60)
GFR calc non Af Amer: 9 mL/min — ABNORMAL LOW (ref >60)
Glucose, Bld: 114 mg/dL — ABNORMAL HIGH (ref 70–99)
Potassium: 3.8 mmol/L (ref 3.5–5.1)
Sodium: 139 mmol/L (ref 135–145)

## 2018-05-18 LAB — GLUCOSE, CAPILLARY
Glucose-Capillary: 77 mg/dL (ref 70–99)
Glucose-Capillary: 107 mg/dL — ABNORMAL HIGH (ref 70–99)
Glucose-Capillary: 84 mg/dL (ref 70–99)
Glucose-Capillary: 89 mg/dL (ref 70–99)

## 2018-05-18 MED ORDER — SODIUM CHLORIDE 0.9 % IV SOLN
INTRAVENOUS | Status: DC | PRN
  Administered 2018-05-18: 1000 mL via INTRAVENOUS

## 2018-05-18 NOTE — Clinical Social Work Note (Signed)
CSW received consult for possible home health needs. Please consult RN case manager for this issue. CSW is signing off. Please consult should needs arise.  Santiago Bumpers, MSW, Latanya Presser [phone]

## 2018-05-18 NOTE — Progress Notes (Signed)
Subjective:  CC:  Jonathan Mcgrath is a 77 y.o. male  Hospital stay day 2, 2 Days Post-Op lap extended right hemicolectomy  HPI: No issues overnight.  Had small BM yesterday. No pain  ROS:  A 5 point review of systems was performed and pertinent positives and negatives noted in HPI.   Objective:      Temp:  [97.6 F (36.4 C)-98.8 F (37.1 C)] 98.8 F (37.1 C) (11/02 0631) Pulse Rate:  [59-76] 76 (11/02 0631) Resp:  [9-21] 18 (11/02 0631) BP: (141-156)/(58-68) 150/68 (11/02 0631) SpO2:  [91 %-100 %] 91 % (11/02 0631)     Height: 6\' 1"  (185.4 cm) Weight: 93.4 kg BMI (Calculated): 27.17   Intake/Output this shift:  Total I/O In: 540 [P.O.:540] Out: -        Constitutional :  alert, cooperative, appears stated age and no distress  Respiratory:  clear to auscultation bilaterally  Cardiovascular:  regular rate and rhythm  Gastrointestinal: soft, non-tender; bowel sounds normal; no masses,  no organomegaly.   Skin: Cool and moist. Incision and port sites c/d/i.  Psychiatric: Normal affect, non-agitated, not confused       LABS:  CMP Latest Ref Rng & Units 05/18/2018 05/17/2018 05/16/2018  Glucose 70 - 99 mg/dL 114(H) 89 84  BUN 8 - 23 mg/dL 13 21 -  Creatinine 0.61 - 1.24 mg/dL 5.67(H) 7.37(H) -  Sodium 135 - 145 mmol/L 139 138 141  Potassium 3.5 - 5.1 mmol/L 3.8 3.8 3.3(L)  Chloride 98 - 111 mmol/L 101 101 -  CO2 22 - 32 mmol/L 29 28 -  Calcium 8.9 - 10.3 mg/dL 7.7(L) 7.1(L) -  Total Protein 6.5 - 8.1 g/dL - - -  Total Bilirubin 0.3 - 1.2 mg/dL - - -  Alkaline Phos 38 - 126 U/L - - -  AST 15 - 41 U/L - - -  ALT 0 - 44 U/L - - -   CBC Latest Ref Rng & Units 05/18/2018 05/17/2018 05/16/2018  WBC 4.0 - 10.5 K/uL 11.4(H) 11.8(H) -  Hemoglobin 13.0 - 17.0 g/dL 8.0(L) 7.7(L) 8.2(L)  Hematocrit 39.0 - 52.0 % 24.5(L) 24.2(L) 24.0(L)  Platelets 150 - 400 K/uL 136(L) 133(L) -    RADS: n/a Assessment:   2 Days Post-Op lap extended right hemicolectomy -doing well.   Start clears and continue to monitor.  ESRD on HD; HTN; anemia of CKD, secondary hyperparathyroidism -continue dialysis and management per nephro  Infectious Aortitis -cotinue management per ID.  May consider PICC removal depending on abx plan -Will also need to f/u with vascular to see if plaque on iliacs mentioned on latest report changes management or not

## 2018-05-18 NOTE — Evaluation (Signed)
Physical Therapy Evaluation Patient Details Name: Jonathan Mcgrath MRN: [id number] DOB: [birth year] Today's Date: 05/18/2018   History of Present Illness  Patient is a 77 year old male admitted for assessment of a malignant neoplasm of the ascending colon. Laparoscopic right extended hemicolectomy with primary anastomosis performed on 05/16/2018.  PMH includes CKD, Htn and CA.  Clinical Impression  Pt is a 77 year old male who lives in a one story home with his son.  Pt is independent with occasional use of SPC at baseline.  He drives himself to dialysis appointments 3x/wk.  Pt able to perform bed mobility independently and STS with VC's and min A with use of RW.  Pt able to sit for MMT and presented with good strength and limited shoulder abduction ROM without pain. Pt more comfortable with use of RW and indicated need by gait deviations and 3 LOB's, self corrected, during 200 ft of ambulation.  Pt able to complete all seated there ex without pain increase and demonstrate understanding of execution of exercises and importance of frequent participation.  Pt will continue to benefit from skilled PT with focus on safe functional mobility, balance and proper use of RW.    Follow Up Recommendations Home health PT;Supervision - Intermittent    Equipment Recommendations  Rolling walker with 5" wheels;3in1 (PT)    Recommendations for Other Services       Precautions / Restrictions Precautions Precautions: Fall Restrictions Weight Bearing Restrictions: No      Mobility  Bed Mobility Overal bed mobility: Independent                Transfers Overall transfer level: Needs assistance Equipment used: Rolling walker (2 wheeled)             General transfer comment: Pt is able to stand with use of UE's and more comfortable with use of   Unsteady on feet and required min VC's for hand placement.  Ambulation/Gait Ambulation/Gait assistance: Min guard Gait Distance (Feet): 200  Feet Assistive device: Rolling walker (2 wheeled)     Gait velocity interpretation: 1.31 - 2.62 ft/sec, indicative of limited community ambulator General Gait Details: Fair foot clearance and step length, 3 lateral deviations to R side with self correction.  Pt used RW for entirety of ambulation and stated that he feels more comfortable with this than a cane at this point.  Stairs            Wheelchair Mobility    Modified Rankin (Stroke Patients Only)       Balance Overall balance assessment: Modified Independent                                           Pertinent Vitals/Pain Pain Assessment: No/denies pain(Stated that he felt some "soreness" when he stood from the bed.)    Home Living Family/patient expects to be discharged to:: Private residence Living Arrangements: Children(Pt's son who leaves during the day for work.) Available Help at Discharge: Family;Available PRN/intermittently Type of Home: House Home Access: Stairs to enter Entrance Stairs-Rails: Right Entrance Stairs-Number of Steps: 1 Home Layout: One level Home Equipment: Cane - single point      Prior Function Level of Independence: Independent with assistive device(s)         Comments: has been driving himself to dialysis and recently bought a cane due to unsteadiness on feet.  Hand Dominance   Dominant Hand: Right    Extremity/Trunk Assessment   Upper Extremity Assessment Upper Extremity Assessment: Overall WFL for tasks assessed(Grossly 4/5 bilaterally with decreased shoulder abduction ROM bilaterally.  Pt reports no pain with ROM limitations.)    Lower Extremity Assessment Lower Extremity Assessment: Overall WFL for tasks assessed(Grossly 4+/5 bilaterally with the exception of HS strength: 4-/5 bilaterally.)       Communication   Communication: HOH  Cognition Arousal/Alertness: Awake/alert Behavior During Therapy: WFL for tasks assessed/performed Overall  Cognitive Status: No family/caregiver present to determine baseline cognitive functioning                                 General Comments: Pt alert and oriented but not always responsive to questions and commands, or appeared to not hear PT.  Pleasant and willing to work with PT.      General Comments      Exercises General Exercises - Lower Extremity Ankle Circles/Pumps: 20 reps;Seated;Both(Instructed to perform every hour when he uses spirometer.) Quad Sets: Both;10 reps;Seated;Strengthening Long Arc Quad: Strengthening;Both;10 reps;Seated Hip ABduction/ADduction: Strengthening;10 reps;Seated(pillow squeezes for hip adduction, pt able to perform partial SLR to do hip abduction.) Straight Leg Raises: 10 reps;Both;Strengthening;Seated   Assessment/Plan    PT Assessment Patient needs continued PT services  PT Problem List Decreased strength;Decreased mobility;Decreased balance;Decreased knowledge of use of DME;Decreased activity tolerance       PT Treatment Interventions DME instruction;Therapeutic activities;Gait training;Therapeutic exercise;Patient/family education;Stair training;Balance training;Functional mobility training;Neuromuscular re-education    PT Goals (Current goals can be found in the Care Plan section)  Acute Rehab PT Goals Patient Stated Goal: To return to walking without RW as soon as possible. PT Goal Formulation: With patient Time For Goal Achievement: 06/01/18 Potential to Achieve Goals: Good    Frequency Min 2X/week   Barriers to discharge        Co-evaluation               AM-PAC PT "6 Clicks" Daily Activity  Outcome Measure Difficulty turning over in bed (including adjusting bedclothes, sheets and blankets)?: A Little Difficulty moving from lying on back to sitting on the side of the bed? : A Little Difficulty sitting down on and standing up from a chair with arms (e.g., wheelchair, bedside commode, etc,.)?: A Little Help  needed moving to and from a bed to chair (including a wheelchair)?: A Little Help needed walking in hospital room?: A Little Help needed climbing 3-5 steps with a railing? : A Little 6 Click Score: 18    End of Session Equipment Utilized During Treatment: Gait belt Activity Tolerance: Patient tolerated treatment well Patient left: in chair;with chair alarm set;with call bell/phone within reach   PT Visit Diagnosis: Unsteadiness on feet (R26.81);Muscle weakness (generalized) (M62.81)    Time: 8882-8003 PT Time Calculation (min) (ACUTE ONLY): 17 min   Charges:   PT Evaluation $PT Eval Low Complexity: 1 Low PT Treatments $Therapeutic Exercise: 8-22 mins        Roxanne Gates, PT, DPT   Roxanne Gates 05/18/2018, 3:03 PM

## 2018-05-18 NOTE — Progress Notes (Signed)
[facility]  ROUNDING NOTE   Subjective:   Hemodialysis treatment yesterday. Tolerating treatment well. UF of 2 liters.  Objective:  Vital signs in last 24 hours:  Temp:  [97.6 F (36.4 C)-98.8 F (37.1 C)] 98.8 F (37.1 C) (11/02 0631) Pulse Rate:  [59-76] 76 (11/02 0631) Resp:  [9-21] 18 (11/02 0631) BP: (131-156)/(58-68) 150/68 (11/02 0631) SpO2:  [91 %-100 %] 91 % (11/02 0631) Weight:  [93.4 kg] 93.4 kg (11/01 1200)  Weight change: 1.32 kg Filed Weights   05/16/18 1240 05/17/18 1200  Weight: 92.1 kg 93.4 kg    Intake/Output: I/O last 3 completed shifts: In: 1627.5 [P.O.:818; I.V.:449.5; IV Piggyback:360] Out: 2000 [Other:2000]   Intake/Output this shift:  Total I/O In: 540 [P.O.:540] Out: -   Physical Exam: General: NAD,   Head: Normocephalic, atraumatic. Moist oral mucosal membranes  Eyes: Anicteric, PERRL  Neck: Supple, trachea midline  Lungs:  Clear to auscultation  Heart: Regular rate and rhythm  Abdomen:  Soft, nontender, +midline incision  Extremities:  no  peripheral edema.  Neurologic: Nonfocal, moving all four extremities  Skin: No lesions  Access: Left AVF    Basic Metabolic Panel: Recent Labs  Lab 05/14/18 1100 05/16/18 1313 05/17/18 1204 05/18/18 0852  NA 142 141 138 139  K 3.5 3.3* 3.8 3.8  CL 97*  --  101 101  CO2 33*  --  28 29  GLUCOSE 136* 84 89 114*  BUN 26*  --  21 13  CREATININE 7.03*  --  7.37* 5.67*  CALCIUM 7.6*  --  7.1* 7.7*  PHOS  --   --  5.3*  --     Liver Function Tests: Recent Labs  Lab 05/17/18 1204  ALBUMIN 2.6*   No results for input(s): LIPASE, AMYLASE in the last 168 hours. No results for input(s): AMMONIA in the last 168 hours.  CBC: Recent Labs  Lab 05/14/18 1100 05/16/18 1248 05/16/18 1313 05/17/18 0509 05/18/18 0852  WBC 12.0*  --   --  11.8* 11.4*  NEUTROABS 3.0  --   --   --   --   HGB 8.3* 8.2* 8.2* 7.7* 8.0*  HCT 25.8* 24.8* 24.0* 24.2* 24.5*  MCV 91.2  --   --  92.7  92.8  PLT 148*  --   --  133* 136*    Cardiac Enzymes: No results for input(s): CKTOTAL, CKMB, CKMBINDEX, TROPONINI in the last 168 hours.  BNP: Invalid input(s): POCBNP  CBG: Recent Labs  Lab 05/17/18 0720 05/17/18 1455 05/17/18 1637 05/17/18 2119 05/18/18 0734  GLUCAP 72 72 72 77 77    Microbiology: Results for orders placed or performed during the hospital encounter of 05/16/18  MRSA PCR Screening     Status: None   Collection Time: 05/16/18  6:56 PM  Result Value Ref Range Status   MRSA by PCR NEGATIVE NEGATIVE Final    Comment:        The GeneXpert MRSA Assay (FDA approved for NASAL specimens only), is one component of a comprehensive MRSA colonization surveillance program. It is not intended to diagnose MRSA infection nor to guide or monitor treatment for MRSA infections. Performed at [facility], [address] [place], [place] [address]   CULTURE, BLOOD (ROUTINE X 2) w Reflex to ID Panel     Status: None (Preliminary result)   Collection Time: 05/17/18 11:12 PM  Result Value Ref Range Status   Specimen Description BLOOD RIGHT ANTECUBITAL  Final   Special  Requests   Final    BOTTLES DRAWN AEROBIC AND ANAEROBIC Blood Culture adequate volume   Culture   Final    NO GROWTH < 12 HOURS Performed at [facility], [address] [place], [place] [address]    Report Status PENDING  Incomplete  CULTURE, BLOOD (ROUTINE X 2) w Reflex to ID Panel     Status: None (Preliminary result)   Collection Time: 05/17/18 11:19 PM  Result Value Ref Range Status   Specimen Description BLOOD RIGHT HAND  Final   Special Requests   Final    BOTTLES DRAWN AEROBIC AND ANAEROBIC Blood Culture results may not be optimal due to an excessive volume of blood received in culture bottles   Culture   Final    NO GROWTH < 12 HOURS Performed at [facility], [address] [place], [place] [address]    Report Status PENDING  Incomplete    Coagulation  Studies: No results for input(s): LABPROT, INR in the last 72 hours.  Urinalysis: No results for input(s): COLORURINE, LABSPEC, PHURINE, GLUCOSEU, HGBUR, BILIRUBINUR, KETONESUR, PROTEINUR, UROBILINOGEN, NITRITE, LEUKOCYTESUR in the last 72 hours.  Invalid input(s): APPERANCEUR    Imaging: No results found.   Medications:   . ampicillin-sulbactam (UNASYN) IV 3 g (05/18/18 0631)   . amLODipine  5 mg Oral Daily  . Chlorhexidine Gluconate Cloth  6 each Topical Q0600  . cinacalcet  30 mg Oral BH-q7a  . ferric citrate  210 mg Oral Daily  . heparin  5,000 Units Subcutaneous Q8H  . insulin aspart  0-5 Units Subcutaneous QHS  . insulin aspart  0-9 Units Subcutaneous TID WC  . multivitamin  1 tablet Oral Daily   acetaminophen **OR** acetaminophen, HYDROcodone-acetaminophen, Menthol (Topical Analgesic), morphine injection, ondansetron **OR** ondansetron (ZOFRAN) IV, phenol  Assessment/ Plan:  [name] is a 77 y.o. black male with end stage renal disease on hemodialysis,  hypertension, diabetes mellitus, GERD, anemia of CKD, SHPTH, colon cancer status post resection on 10/31 by Dr. [name]  [facility]/MWF/L AVF/92kg  1.  End-stage renal disease: MWF: tolerated treatment well.  Hemodialysis for Monday  2.  Anemia of CKD: hemoglobin 8 - Hold EPO due to malignancy. Will need oncology clearance to restart.   3.  Secondary hyperparathyroidism: outpatient PTH 704, calcium and phosphorus at goal.  - Restart cinacalcet and Auryxia when appropriate.   4. Hypertension: blood pressure at goal 150/68. Home regimen of losartan, carvedilol and furosemide.  On amlodipine.     LOS: 2 Tallis Soledad 11/2/201911:38 AM

## 2018-05-19 LAB — BASIC METABOLIC PANEL
Anion gap: 10 (ref 5–15)
BUN: 17 mg/dL (ref 8–23)
CO2: 29 mmol/L (ref 22–32)
Calcium: 7.7 mg/dL — ABNORMAL LOW (ref 8.9–10.3)
Chloride: 101 mmol/L (ref 98–111)
Creatinine, Ser: 7.26 mg/dL — ABNORMAL HIGH (ref 0.61–1.24)
GFR calc Af Amer: 7 mL/min — ABNORMAL LOW (ref >60)
GFR calc non Af Amer: 6 mL/min — ABNORMAL LOW (ref >60)
Glucose, Bld: 78 mg/dL (ref 70–99)
Potassium: 3.7 mmol/L (ref 3.5–5.1)
Sodium: 140 mmol/L (ref 135–145)

## 2018-05-19 LAB — GLUCOSE, CAPILLARY
Glucose-Capillary: 81 mg/dL (ref 70–99)
Glucose-Capillary: 93 mg/dL (ref 70–99)
Glucose-Capillary: 122 mg/dL — ABNORMAL HIGH (ref 70–99)
Glucose-Capillary: 79 mg/dL (ref 70–99)

## 2018-05-19 MED ORDER — INSULIN ASPART 100 UNIT/ML ~~LOC~~ SOLN
0.0000 [IU] | Freq: Three times a day (TID) | SUBCUTANEOUS | Status: DC
  Administered 2018-05-19: 1 [IU] via SUBCUTANEOUS
  Filled 2018-05-19: qty 1

## 2018-05-19 MED ORDER — INSULIN ASPART 100 UNIT/ML ~~LOC~~ SOLN: 0.0000 [IU] | Freq: Every day | SUBCUTANEOUS | Status: DC

## 2018-05-19 NOTE — Progress Notes (Signed)
[facility]  ROUNDING NOTE   Subjective:   Asking to have his diet advanced.   Objective:  Vital signs in last 24 hours:  Temp:  [98.1 F (36.7 C)-98.9 F (37.2 C)] 98.1 F (36.7 C) (11/03 0535) Pulse Rate:  [72-77] 77 (11/03 0535) Resp:  [16-20] 20 (11/03 0535) BP: (142-154)/(66-71) 154/66 (11/03 0535) SpO2:  [90 %-95 %] 95 % (11/03 0535)  Weight change:  Filed Weights   05/16/18 1240 05/17/18 1200  Weight: 92.1 kg 93.4 kg    Intake/Output: I/O last 3 completed shifts: In: 868.4 [P.O.:540; I.V.:13.7; IV Piggyback:314.7] Out: 0    Intake/Output this shift:  No intake/output data recorded.  Physical Exam: General: NAD,   Head: Normocephalic, atraumatic. Moist oral mucosal membranes  Eyes: Anicteric, PERRL  Neck: Supple, trachea midline  Lungs:  Clear to auscultation  Heart: Regular rate and rhythm  Abdomen:  Soft, nontender, +midline incision  Extremities:  no  peripheral edema.  Neurologic: Nonfocal, moving all four extremities  Skin: No lesions  Access: Left AVF    Basic Metabolic Panel: Recent Labs  Lab 05/14/18 1100 05/16/18 1313 05/17/18 1204 05/18/18 0852 05/19/18 0521  NA 142 141 138 139 140  K 3.5 3.3* 3.8 3.8 3.7  CL 97*  --  101 101 101  CO2 33*  --  28 29 29   GLUCOSE 136* 84 89 114* 78  BUN 26*  --  21 13 17   CREATININE 7.03*  --  7.37* 5.67* 7.26*  CALCIUM 7.6*  --  7.1* 7.7* 7.7*  PHOS  --   --  5.3*  --   --     Liver Function Tests: Recent Labs  Lab 05/17/18 1204  ALBUMIN 2.6*   No results for input(s): LIPASE, AMYLASE in the last 168 hours. No results for input(s): AMMONIA in the last 168 hours.  CBC: Recent Labs  Lab 05/14/18 1100 05/16/18 1248 05/16/18 1313 05/17/18 0509 05/18/18 0852  WBC 12.0*  --   --  11.8* 11.4*  NEUTROABS 3.0  --   --   --   --   HGB 8.3* 8.2* 8.2* 7.7* 8.0*  HCT 25.8* 24.8* 24.0* 24.2* 24.5*  MCV 91.2  --   --  92.7 92.8  PLT 148*  --   --  133* 136*    Cardiac Enzymes: No  results for input(s): CKTOTAL, CKMB, CKMBINDEX, TROPONINI in the last 168 hours.  BNP: Invalid input(s): POCBNP  CBG: Recent Labs  Lab 05/18/18 0734 05/18/18 1148 05/18/18 1708 05/18/18 2112 05/19/18 0730  GLUCAP 77 107* 84 89 81    Microbiology: Results for orders placed or performed during the hospital encounter of 05/16/18  MRSA PCR Screening     Status: None   Collection Time: 05/16/18  6:56 PM  Result Value Ref Range Status   MRSA by PCR NEGATIVE NEGATIVE Final    Comment:        The GeneXpert MRSA Assay (FDA approved for NASAL specimens only), is one component of a comprehensive MRSA colonization surveillance program. It is not intended to diagnose MRSA infection nor to guide or monitor treatment for MRSA infections. Performed at [facility], [address] [place], [place] [address]   CULTURE, BLOOD (ROUTINE X 2) w Reflex to ID Panel     Status: None (Preliminary result)   Collection Time: 05/17/18 11:12 PM  Result Value Ref Range Status   Specimen Description BLOOD RIGHT ANTECUBITAL  Final   Special Requests  Final    BOTTLES DRAWN AEROBIC AND ANAEROBIC Blood Culture adequate volume   Culture   Final    NO GROWTH 2 DAYS Performed at [facility], [address] [place], [place] [address]    Report Status PENDING  Incomplete  CULTURE, BLOOD (ROUTINE X 2) w Reflex to ID Panel     Status: None (Preliminary result)   Collection Time: 05/17/18 11:19 PM  Result Value Ref Range Status   Specimen Description BLOOD RIGHT HAND  Final   Special Requests   Final    BOTTLES DRAWN AEROBIC AND ANAEROBIC Blood Culture results may not be optimal due to an excessive volume of blood received in culture bottles   Culture   Final    NO GROWTH 2 DAYS Performed at [facility], [address], [place],  [address]    Report Status PENDING  Incomplete    Coagulation Studies: No results for input(s): LABPROT, INR in the last 72  hours.  Urinalysis: No results for input(s): COLORURINE, LABSPEC, PHURINE, GLUCOSEU, HGBUR, BILIRUBINUR, KETONESUR, PROTEINUR, UROBILINOGEN, NITRITE, LEUKOCYTESUR in the last 72 hours.  Invalid input(s): APPERANCEUR    Imaging: No results found.   Medications:    . amLODipine  5 mg Oral Daily  . cinacalcet  30 mg Oral BH-q7a  . ferric citrate  210 mg Oral Daily  . heparin  5,000 Units Subcutaneous Q8H  . multivitamin  1 tablet Oral Daily   acetaminophen **OR** acetaminophen, HYDROcodone-acetaminophen, Menthol (Topical Analgesic), morphine injection, ondansetron **OR** ondansetron (ZOFRAN) IV  Assessment/ Plan:  [name] is a 77 y.o. black male with end stage renal disease on hemodialysis,  hypertension, diabetes mellitus, GERD, anemia of CKD, SHPTH, colon cancer status post resection on 10/31 by Dr. [name]  [facility] MWF L AVF 92kg  1.  End-stage renal disease: MWF:   Hemodialysis for Monday  2.  Anemia of CKD: hemoglobin 8 - Hold EPO due to malignancy. Will need oncology clearance to restart.   3.  Secondary hyperparathyroidism: outpatient PTH 704, calcium and phosphorus at goal.  - cinacalcet and Auryxia    4. Hypertension: blood pressure at goal. Home regimen of losartan, carvedilol and furosemide.  On amlodipine.    LOS: 3 Jonathan Mcgrath 11/3/201910:35 AM

## 2018-05-19 NOTE — Care Management Note (Signed)
Case Management Note  Patient Details  Name: Jonathan Mcgrath MRN: [id number] Date of Birth: 15-Jun-1941  Subjective/Objective:   RNCM consulted for Home health needs. Patient currently open to [facility] for nursing services. Patient will likely discharge on IV abx per ID and will need to continue skilled nursing in the home. Patient lives with his son who will be available to assist with abx administration. PICC line already in place. Patient uses a rolling walker in the home. Heads up referral to [facility] for discharge plans tomorrow.               Action/Plan:   Expected Discharge Date:  04/18/19               Expected Discharge Plan:     In-House Referral:     Discharge planning Services  CM Consult  Post Acute Care Choice:  Durable Medical Equipment, Home Health, Resumption of Svcs/PTA Provider Choice offered to:  Patient, Adult Children  DME Arranged:  IV pump/equipment DME Agency:  [place]:  RN [facility] Agency:  [place]  Status of Service:  In process, will continue to follow  If discussed at Long Length of Stay Meetings, dates discussed:    Additional Comments:  Latanya Maudlin, RN 05/19/2018, 4:19 PM

## 2018-05-19 NOTE — Progress Notes (Signed)
Subjective:  CC:  Jonathan Mcgrath is a 77 y.o. male  Hospital stay day 3, 3 Days Post-Op lap extended right hemicolectomy  HPI: No issues overnight.  Had more BM yesterday. No pain  ROS:  A 5 point review of systems was performed and pertinent positives and negatives noted in HPI.   Objective:      Temp:  [98.1 F (36.7 C)-98.3 F (36.8 C)] 98.2 F (36.8 C) (11/03 1214) Pulse Rate:  [75-79] 79 (11/03 1214) Resp:  [20] 20 (11/03 1214) BP: (144-154)/(59-66) 149/59 (11/03 1214) SpO2:  [92 %-95 %] 95 % (11/03 1214)     Height: 6\' 1"  (185.4 cm) Weight: 93.4 kg BMI (Calculated): 27.17   Intake/Output this shift:  Total I/O In: 240 [P.O.:240] Out: 0        Constitutional :  alert, cooperative, appears stated age and no distress  Respiratory:  clear to auscultation bilaterally  Cardiovascular:  regular rate and rhythm  Gastrointestinal: soft, non-tender; bowel sounds normal; no masses,  no organomegaly.   Skin: Cool and moist. Incision and port sites c/d/i.  Psychiatric: Normal affect, non-agitated, not confused       LABS:  CMP Latest Ref Rng & Units 05/19/2018 05/18/2018 05/17/2018  Glucose 70 - 99 mg/dL 78 114(H) 89  BUN 8 - 23 mg/dL 17 13 21   Creatinine 0.61 - 1.24 mg/dL 7.26(H) 5.67(H) 7.37(H)  Sodium 135 - 145 mmol/L 140 139 138  Potassium 3.5 - 5.1 mmol/L 3.7 3.8 3.8  Chloride 98 - 111 mmol/L 101 101 101  CO2 22 - 32 mmol/L 29 29 28   Calcium 8.9 - 10.3 mg/dL 7.7(L) 7.7(L) 7.1(L)  Total Protein 6.5 - 8.1 g/dL - - -  Total Bilirubin 0.3 - 1.2 mg/dL - - -  Alkaline Phos 38 - 126 U/L - - -  AST 15 - 41 U/L - - -  ALT 0 - 44 U/L - - -   CBC Latest Ref Rng & Units 05/18/2018 05/17/2018 05/16/2018  WBC 4.0 - 10.5 K/uL 11.4(H) 11.8(H) -  Hemoglobin 13.0 - 17.0 g/dL 8.0(L) 7.7(L) 8.2(L)  Hematocrit 39.0 - 52.0 % 24.5(L) 24.2(L) 24.0(L)  Platelets 150 - 400 K/uL 136(L) 133(L) -    RADS: n/a Assessment:   3 Days Post-Op lap extended right hemicolectomy -doing  well.  Start soft diet and continue to monitor.  ESRD on HD; HTN; anemia of CKD, secondary hyperparathyroidism -continue dialysis and management per nephro  Infectious Aortitis -cotinue management per ID.  May consider PICC removal depending on abx plan -Will also need to f/u with vascular to see if plaque on iliacs mentioned on latest report changes management or not -f/u outpt abx management  Home health PT setup per case management

## 2018-05-20 LAB — GLUCOSE, CAPILLARY
Glucose-Capillary: 87 mg/dL (ref 70–99)
Glucose-Capillary: 109 mg/dL — ABNORMAL HIGH (ref 70–99)
Glucose-Capillary: 107 mg/dL — ABNORMAL HIGH (ref 70–99)
Glucose-Capillary: 86 mg/dL (ref 70–99)

## 2018-05-20 MED ORDER — PIPERACILLIN-TAZOBACTAM IN DEX 2-0.25 GM/50ML IV SOLN
2.2500 g | Freq: Two times a day (BID) | INTRAVENOUS | Status: DC
  Filled 2018-05-20 (×5): qty 50

## 2018-05-20 MED ORDER — ACETAMINOPHEN 325 MG PO TABS: 650.0000 mg | ORAL_TABLET | Freq: Three times a day (TID) | ORAL | 0 refills | Status: DC | PRN

## 2018-05-20 MED ORDER — PIPERACILLIN SOD-TAZOBACTAM SO 2.25 (2-0.25) G IV SOLR
2.2500 g | Freq: Two times a day (BID) | INTRAVENOUS | Status: DC
  Administered 2018-05-20: 2.25 g via INTRAVENOUS
  Filled 2018-05-20 (×2): qty 2.25

## 2018-05-20 MED ORDER — HYDROCODONE-ACETAMINOPHEN 5-325 MG PO TABS: 1.0000 | ORAL_TABLET | Freq: Four times a day (QID) | ORAL | 0 refills | Status: AC | PRN

## 2018-05-20 MED ORDER — DOCUSATE SODIUM 100 MG PO CAPS: 100.0000 mg | ORAL_CAPSULE | Freq: Two times a day (BID) | ORAL | 0 refills | Status: AC | PRN

## 2018-05-20 MED ORDER — IBUPROFEN 800 MG PO TABS: 800.0000 mg | ORAL_TABLET | Freq: Three times a day (TID) | ORAL | 0 refills | Status: DC | PRN

## 2018-05-20 MED ORDER — PIPERACILLIN-TAZOBACTAM IV (FOR PTA / DISCHARGE USE ONLY): 2.2500 g | Freq: Two times a day (BID) | INTRAVENOUS | 0 refills | Status: AC

## 2018-05-20 NOTE — Progress Notes (Signed)
HD tx end    05/20/18 1708  Vital Signs  Pulse Rate 71  Pulse Rate Source Monitor  Resp 11  BP (!) 166/64  BP Location Right Arm  BP Method Automatic  Patient Position (if appropriate) Lying  Oxygen Therapy  SpO2 94 %  O2 Device Room Air  During Hemodialysis Assessment  Dialysis Fluid Bolus Normal Saline  Bolus Amount (mL) 250 mL  Intra-Hemodialysis Comments Tx completed

## 2018-05-20 NOTE — Care Management Note (Signed)
Case Management Note  Patient Details  Name: Jonathan Mcgrath MRN: [id number] Date of Birth: 01/08/1941   Brenton Grills with [facility] notified of discharge.   Due to national back order of Unasyn patient has been changed to zosyn for discharge.  Will receive 1st dose will inpatient.   PICC extension has been obtained from [facility], and bedside RN to place prior to discharge  Jermaine with [facility] has requested IV pole from home.   Outpatient HD has been confirmed with [facility] HD liaison for Wednesday at 10:15. [facility] dialysis liaison notified of discharge   Subjective/Objective:                    Action/Plan:   Expected Discharge Date:  05/20/18               Expected Discharge Plan:  [place]  In-House Referral:     Discharge planning Services  CM Consult  Post Acute Care Choice:  Durable Medical Equipment, Home Health, Resumption of Svcs/PTA Provider Choice offered to:  Patient, Adult Children  DME Arranged:  IV pump/equipment DME Agency:  [place] Arranged:  RN, PT [facility] Agency:  [place]  Status of Service:  Completed, signed off  If discussed at [place] of Stay Meetings, dates discussed:    Additional Comments:  Beverly Sessions, RN 05/20/2018, 4:15 PM

## 2018-05-20 NOTE — Treatment Plan (Signed)
Diagnosis: Clostridium septicum bacteremia and aortitis Baseline Creatinine -ESRD on dialysis  No Known Allergies  OPAT Orders Unasyn 3 grams IV every 12 hours  End Date: 06/13/18  Chest line Care Per Protocol:  Labs every Tuesday  while on IV antibiotics: _X_ CBC with differential _X_ CMP   Arrange to get chest line removed after completion of antibiotic  Fax weekly labs to [phone]  Clinic Follow Up Appt 4 weeks [phone]:

## 2018-05-20 NOTE — Treatment Plan (Signed)
Diagnosis: Clostridium septicum bacteremia and aortitis Baseline Creatinine -ESRD on dialysis  No Known Allergies  OPAT Orders Zosyn 2.25grams IV every 12 hours ( as unasyn back order) End Date: 06/13/18  Chest line Care Per Protocol:  Labs every Tuesday  while on IV antibiotics: _X_ CBC with differential _X_ CMP   Arrange to get chest line removed after completion of antibiotic  Fax weekly labs to [phone]  Clinic Follow Up Appt 4 weeks [phone]:

## 2018-05-20 NOTE — Progress Notes (Signed)
Post HD assessment. Pt tolerated tx well without c/o or complication. Net UF 1015, goal met.    05/20/18 1716  Vital Signs  Temp 98.3 F (36.8 C)  Temp Source Oral  Pulse Rate 72  Pulse Rate Source Monitor  Resp 16  BP (!) 169/68  BP Location Right Arm  BP Method Automatic  Patient Position (if appropriate) Lying  Oxygen Therapy  SpO2 93 %  O2 Device Room Air  Dialysis Weight  Weight 90.8 kg  Type of Weight Post-Dialysis  Post-Hemodialysis Assessment  Rinseback Volume (mL) 250 mL  KECN 79.3 V  Dialyzer Clearance Lightly streaked  Duration of HD Treatment -hour(s) 3.5 hour(s)  Hemodialysis Intake (mL) 500 mL  UF Total -Machine (mL) 1515 mL  Net UF (mL) 1015 mL  Tolerated HD Treatment Yes  AVG/AVF Arterial Site Held (minutes) 10 minutes  AVG/AVF Venous Site Held (minutes) 10 minutes  Education / Care Plan  Dialysis Education Provided Yes  Documented Education in Care Plan Yes  Fistula / Graft Left Upper arm Arteriovenous fistula  No Placement Date or Time found.   Placed prior to admission: Yes  Orientation: Left  Access Location: Upper arm  Access Type: Arteriovenous fistula  Site Condition No complications  Fistula / Graft Assessment Present;Thrill;Bruit  Status Deaccessed  Drainage Description None

## 2018-05-20 NOTE — Progress Notes (Signed)
Patient returned from dialysis.

## 2018-05-20 NOTE — Progress Notes (Signed)
HD tx start    05/20/18 1333  Vital Signs  Pulse Rate 73  Pulse Rate Source Monitor  Resp 10  BP (!) 145/68  BP Location Right Arm  BP Method Automatic  Patient Position (if appropriate) Lying  Oxygen Therapy  SpO2 92 %  O2 Device Room Air  During Hemodialysis Assessment  Blood Flow Rate (mL/min) 400 mL/min  Arterial Pressure (mmHg) -100 mmHg  Venous Pressure (mmHg) 140 mmHg  Transmembrane Pressure (mmHg) 60 mmHg  Ultrafiltration Rate (mL/min) 430 mL/min  Dialysate Flow Rate (mL/min) 600 ml/min  Conductivity: Machine  14  HD Safety Checks Performed Yes  Dialysis Fluid Bolus Normal Saline  Bolus Amount (mL) 250 mL  Intra-Hemodialysis Comments Tx initiated  Fistula / Graft Left Upper arm Arteriovenous fistula  No Placement Date or Time found.   Placed prior to admission: Yes  Orientation: Left  Access Location: Upper arm  Access Type: Arteriovenous fistula  Status Accessed  Needle Size 15

## 2018-05-20 NOTE — Progress Notes (Signed)
Jonathan Mcgrath is a 77 y.o. male with a history of end-stage renal disease on dialysis, hypertension was recently in [facility] with diarrhea and vomiting and was diagnosed with clostridium septicum bacteremia which led to colonoscopy on 04/18/2018 which showed multiple polyps and a colon mass in the cecum.  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  Patient was initially treated with IV Unasyn and was discharged home on Unasyn.  But unfortunately because of twice a day dose and his son not able to give him the morning doses because of work and dialysis schedule Unasyn was not continued and he was changed to vancomycin that he is getting during dialysis and p.o. Flagyl.  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 No contrast extravasation. 2. Persistent though reduced perivascular stranding surrounding the distal abdominal aorta and left common iliac artery. He underwent lap colectomy with resection of the mass and  Primary anastomosis. He is stable and doing okay   Subjective Pt doing well No pain abdomen Had bowel movt ? ?: Objective:  VITALS:  BP (!) 163/67 (BP Location: Right Arm)   Pulse 75   Temp 98.2 F (36.8 C) (Oral)   Resp 18   Ht 6\' 1"  (1.854 m)   Wt 93.4 kg   SpO2 97%   BMI 27.17 kg/m  PHYSICAL EXAM:  General: Alert, cooperative, no distress, appears stated age.  Head: Normocephalic, without obvious abnormality, atraumatic. Eyes: Conjunctivae clear, anicteric sclerae. Pupils are equal ENT Nares normal. No drainage or sinus tenderness. Lips, mucosa, and tongue normal. No Thrush Neck: Supple, symmetrical, no adenopathy, thyroid: non tender no carotid bruit and no JVD. Back: No CVA tenderness. Chest line Left fistula Lungs:  Clear to auscultation bilaterally. No Wheezing or Rhonchi. No rales. Heart: Regular rate and rhythm, no murmur, rub or gallop. Abdomen: Soft, surgical site covered with dressing non-tender,not distended. Bowel sounds normal. No masses Extremities: atraumatic, no cyanosis. No edema. No clubbing Skin: No rashes or lesions. Or bruising Lymph: Cervical, supraclavicular normal. Neurologic: Grossly non-focal Pertinent Labs Lab Results CBC    Component Value Date/Time   WBC 11.4 (H) 05/18/2018 0852   RBC 2.64 (L) 05/18/2018 0852   HGB 8.0 (L) 05/18/2018 0852   HGB 10.6 (L) 04/12/2014 0433   HCT 24.5 (L) 05/18/2018 0852   HCT 33.5 (L) 04/12/2014 0433   PLT 136 (L) 05/18/2018 0852   PLT 123 (L) 04/12/2014 0433   MCV 92.8 05/18/2018 0852   MCV 94 04/12/2014 0433   MCH 30.3 05/18/2018 0852   MCHC 32.7 05/18/2018 0852   RDW 15.2 05/18/2018 0852   RDW 19.2 (H) 04/12/2014 0433   LYMPHSABS 8.2 (H) 05/14/2018 1100   LYMPHSABS 11.8 (H) 04/11/2014 1951   MONOABS 0.7 05/14/2018 1100   MONOABS 1.2 (H) 04/11/2014 1951   EOSABS 0.1 05/14/2018 1100   EOSABS 0.1 04/11/2014 1951   BASOSABS 0.1 05/14/2018 1100   BASOSABS 0.2 (H) 04/11/2014 1951    CMP Latest Ref Rng & Units 05/19/2018 05/18/2018 05/17/2018  Glucose 70 - 99 mg/dL 78 114(H) 89  BUN 8 - 23 mg/dL 17 13 21   Creatinine 0.61 - 1.24 mg/dL 7.26(H) 5.67(H) 7.37(H)  Sodium 135 - 145 mmol/L 140 139 138  Potassium 3.5 - 5.1 mmol/L 3.7 3.8 3.8  Chloride  98 - 111 mmol/L 101 101 101  CO2 22 - 32 mmol/L 29 29 28   Calcium 8.9 - 10.3 mg/dL 7.7(L) 7.7(L) 7.1(L)  Total Protein 6.5 - 8.1 g/dL - - -  Total Bilirubin 0.3 - 1.2 mg/dL - - -  Alkaline Phos 38 - 126 U/L - - -  AST 15 - 41 U/L - - -  ALT 0 - 44 U/L - - -      Microbiology: Recent Results (from the past 240 hour(s))  MRSA PCR Screening     Status: None   Collection Time: 05/16/18  6:56 PM  Result Value Ref Range Status   MRSA by PCR NEGATIVE NEGATIVE Final    Comment:        The  GeneXpert MRSA Assay (FDA approved for NASAL specimens only), is one component of a comprehensive MRSA colonization surveillance program. It is not intended to diagnose MRSA infection nor to guide or monitor treatment for MRSA infections. Performed at [facility], [address] [place], [place] [address]   CULTURE, BLOOD (ROUTINE X 2) w Reflex to ID Panel     Status: None (Preliminary result)   Collection Time: 05/17/18 11:12 PM  Result Value Ref Range Status   Specimen Description BLOOD RIGHT ANTECUBITAL  Final   Special Requests   Final    BOTTLES DRAWN AEROBIC AND ANAEROBIC Blood Culture adequate volume   Culture   Final    NO GROWTH 3 DAYS Performed at [facility], [address] [place], [place] [address]    Report Status PENDING  Incomplete  CULTURE, BLOOD (ROUTINE X 2) w Reflex to ID Panel     Status: None (Preliminary result)   Collection Time: 05/17/18 11:19 PM  Result Value Ref Range Status   Specimen Description BLOOD RIGHT HAND  Final   Special Requests   Final    BOTTLES DRAWN AEROBIC AND ANAEROBIC Blood Culture results may not be optimal due to an excessive volume of blood received in culture bottles   Culture   Final    NO GROWTH 3 DAYS Performed at [facility], [address] [place], [place] [address]    Report Status PENDING  Incomplete   IMAGING RESULTS: ?Two contained 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 No contrast extravasation. 2. Persistent though reduced perivascular stranding surrounding the distal abdominal aorta and left common iliac artery.  Impression/Recommendation ?77 y.o. male with a history of end-stage renal disease on dialysis, hypertension clostridium septicum bacteremia and colon cancer admitted for surgery                      ? 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 Is being treated as endovascular infection-  __will need IV unasyn until 11/28 and then will go on PO augmentin. unasyn better than vancomycin- will have to make sure/check tht he could do unasyn on discharge at home as previously was missing doses and had to be put on vancomycin  ESRD on dilaysis _  ________________________________________________ Discussed with patient and his son  Pt will be able to do Unasyn at home - would like the Dialysis time changed- spoke with case manager- She is looking into it.

## 2018-05-20 NOTE — Consult Note (Signed)
PHARMACY CONSULT NOTE FOR:  OUTPATIENT  PARENTERAL ANTIBIOTIC THERAPY (OPAT)  Indication: Clostridium septicum bacteremia and aortitis Regimen: piperacillin/tazobactam 2.25 grams IV every 12 hours End date: 06/13/18  IV antibiotic discharge orders are pended. To discharging provider:  please sign these orders via discharge navigator,  Select New Orders & click on the button choice - Manage This Unsigned Work.     Thank you for allowing pharmacy to be a part of this patient's care.  Doreene Eland, PharmD, BCPS.   Work Cell: [phone] 05/20/2018 3:14 PM

## 2018-05-20 NOTE — Progress Notes (Signed)
Patient and family educated on central line care.  Antibiotic is complete.  Patient being sent out via wheelchair to son's car

## 2018-05-20 NOTE — Progress Notes (Signed)
Post HD assessment    05/20/18 1715  Neurological  Level of Consciousness Alert  Orientation Level Oriented X4  Respiratory  Respiratory Pattern Regular;Unlabored  Chest Assessment Chest expansion symmetrical  Cough Non-productive  Cardiac  ECG Monitor Yes  Cardiac Rhythm NSR  Vascular  R Radial Pulse +2  L Radial Pulse +2  Edema Generalized;Right lower extremity;Left lower extremity  Integumentary  Integumentary (WDL) X  Skin Color Appropriate for ethnicity  Musculoskeletal  Musculoskeletal (WDL) X  Generalized Weakness Yes  Assistive Device None  GU Assessment  Genitourinary (WDL) X  Genitourinary Symptoms  (HD)  Psychosocial  Psychosocial (WDL) WDL

## 2018-05-20 NOTE — Progress Notes (Signed)
Pre HD assessment    05/20/18 1323  Neurological  Level of Consciousness Alert  Orientation Level Oriented X4  Respiratory  Respiratory Pattern Regular;Unlabored  Chest Assessment Chest expansion symmetrical  Cough Non-productive  Cardiac  ECG Monitor Yes  Cardiac Rhythm NSR  Vascular  R Radial Pulse +2  L Radial Pulse +2  Edema Generalized;Right lower extremity;Left lower extremity  Integumentary  Integumentary (WDL) X  Skin Color Appropriate for ethnicity  Musculoskeletal  Musculoskeletal (WDL) X  Generalized Weakness Yes  Assistive Device None  GU Assessment  Genitourinary (WDL) X  Genitourinary Symptoms  (HD)  Psychosocial  Psychosocial (WDL) WDL

## 2018-05-20 NOTE — Consult Note (Signed)
PHARMACY CONSULT NOTE FOR:  OUTPATIENT  PARENTERAL ANTIBIOTIC THERAPY (OPAT)  Indication: Clostridium septicum bacteremia and aortitis Regimen: Unasyn 3 grams IV every 12 hours End date: 06/13/18  IV antibiotic discharge orders are pended. To discharging provider:  please sign these orders via discharge navigator,  Select New Orders & click on the button choice - Manage This Unsigned Work.     Thank you for allowing pharmacy to be a part of this patient's care.  Forrest Moron, PharmD Clinical Pharmacist 05/20/2018, 12:39 PM

## 2018-05-20 NOTE — Progress Notes (Signed)
[facility]  ROUNDING NOTE   Subjective:   Patient is doing well Awaiting dialysis earlier today when seen Denies any abdominal pain Able to eat, go to bathroom by himself Able to ambulate with a walker  Objective:  Vital signs in last 24 hours:  Temp:  [98.2 F (36.8 C)-98.6 F (37 C)] 98.3 F (36.8 C) (11/04 1322) Pulse Rate:  [71-77] 77 (11/04 1530) Resp:  [10-31] 31 (11/04 1530) BP: (134-165)/(63-96) 165/96 (11/04 1530) SpO2:  [92 %-99 %] 96 % (11/04 1530) Weight:  [92.7 kg] 92.7 kg (11/04 1322)  Weight change:  Filed Weights   05/16/18 1240 05/17/18 1200 05/20/18 1322  Weight: 92.1 kg 93.4 kg 92.7 kg    Intake/Output: I/O last 3 completed shifts: In: 678.5 [P.O.:480; I.V.:13.7; IV Piggyback:184.8] Out: 0    Intake/Output this shift:  Total I/O In: 240 [P.O.:240] Out: -   Physical Exam: General: NAD,   Head: Normocephalic, atraumatic. Moist oral mucosal membranes  Eyes: Anicteric,  Neck: Supple,   Lungs:  Clear to auscultation  Heart: Regular rate and rhythm  Abdomen:  Soft, nontender, +midline incision  Extremities:  no  peripheral edema.  Neurologic: Nonfocal, moving all four extremities  Skin: No lesions  Access: Left AVF, right IJ PICC line    Basic Metabolic Panel: Recent Labs  Lab 05/14/18 1100 05/16/18 1313 05/17/18 1204 05/18/18 0852 05/19/18 0521  NA 142 141 138 139 140  K 3.5 3.3* 3.8 3.8 3.7  CL 97*  --  101 101 101  CO2 33*  --  28 29 29   GLUCOSE 136* 84 89 114* 78  BUN 26*  --  21 13 17   CREATININE 7.03*  --  7.37* 5.67* 7.26*  CALCIUM 7.6*  --  7.1* 7.7* 7.7*  PHOS  --   --  5.3*  --   --     Liver Function Tests: Recent Labs  Lab 05/17/18 1204  ALBUMIN 2.6*   No results for input(s): LIPASE, AMYLASE in the last 168 hours. No results for input(s): AMMONIA in the last 168 hours.  CBC: Recent Labs  Lab 05/14/18 1100 05/16/18 1248 05/16/18 1313 05/17/18 0509 05/18/18 0852  WBC 12.0*  --   --  11.8*  11.4*  NEUTROABS 3.0  --   --   --   --   HGB 8.3* 8.2* 8.2* 7.7* 8.0*  HCT 25.8* 24.8* 24.0* 24.2* 24.5*  MCV 91.2  --   --  92.7 92.8  PLT 148*  --   --  133* 136*    Cardiac Enzymes: No results for input(s): CKTOTAL, CKMB, CKMBINDEX, TROPONINI in the last 168 hours.  BNP: Invalid input(s): POCBNP  CBG: Recent Labs  Lab 05/19/18 1637 05/19/18 2200 05/20/18 0220 05/20/18 0740 05/20/18 1136  GLUCAP 122* 79 87 109* 107*    Microbiology: Results for orders placed or performed during the hospital encounter of 05/16/18  MRSA PCR Screening     Status: None   Collection Time: 05/16/18  6:56 PM  Result Value Ref Range Status   MRSA by PCR NEGATIVE NEGATIVE Final    Comment:        The GeneXpert MRSA Assay (FDA approved for NASAL specimens only), is one component of a comprehensive MRSA colonization surveillance program. It is not intended to diagnose MRSA infection nor to guide or monitor treatment for MRSA infections. Performed at [facility], [address] [place], [place] [address]   CULTURE, BLOOD (ROUTINE X 2) w Reflex  to ID Panel     Status: None (Preliminary result)   Collection Time: 05/17/18 11:12 PM  Result Value Ref Range Status   Specimen Description BLOOD RIGHT ANTECUBITAL  Final   Special Requests   Final    BOTTLES DRAWN AEROBIC AND ANAEROBIC Blood Culture adequate volume   Culture   Final    NO GROWTH 3 DAYS Performed at [facility], [address] [place], [place] [address]    Report Status PENDING  Incomplete  CULTURE, BLOOD (ROUTINE X 2) w Reflex to ID Panel     Status: None (Preliminary result)   Collection Time: 05/17/18 11:19 PM  Result Value Ref Range Status   Specimen Description BLOOD RIGHT HAND  Final   Special Requests   Final    BOTTLES DRAWN AEROBIC AND ANAEROBIC Blood Culture results may not be optimal due to an excessive volume of blood received in culture bottles   Culture   Final    NO GROWTH 3  DAYS Performed at [facility], [address] [place], [place] [address]    Report Status PENDING  Incomplete    Coagulation Studies: No results for input(s): LABPROT, INR in the last 72 hours.  Urinalysis: No results for input(s): COLORURINE, LABSPEC, PHURINE, GLUCOSEU, HGBUR, BILIRUBINUR, KETONESUR, PROTEINUR, UROBILINOGEN, NITRITE, LEUKOCYTESUR in the last 72 hours.  Invalid input(s): APPERANCEUR    Imaging: No results found.   Medications:   . piperacillin-tazobactam (ZOSYN)  IV     . amLODipine  5 mg Oral Daily  . cinacalcet  30 mg Oral BH-q7a  . ferric citrate  210 mg Oral Daily  . heparin  5,000 Units Subcutaneous Q8H  . insulin aspart  0-5 Units Subcutaneous QHS  . insulin aspart  0-9 Units Subcutaneous TID WC  . multivitamin  1 tablet Oral Daily   acetaminophen **OR** acetaminophen, HYDROcodone-acetaminophen, Menthol (Topical Analgesic), morphine injection, ondansetron **OR** ondansetron (ZOFRAN) IV  Assessment/ Plan:  [name] is a 77 y.o. black male with end stage renal disease on hemodialysis,  hypertension, diabetes mellitus, GERD, anemia of CKD, SHPTH, colon cancer status post resection on 10/31 by Dr. [name]  [facility] MWF L AVF 92kg  1.  End-stage renal disease: MWF:   Hemodialysis today  2.  Anemia of CKD: hemoglobin 8 - Hold EPO due to malignancy. Will need oncology clearance to restart.   3.  Secondary hyperparathyroidism: outpatient PTH 704, calcium and phosphorus at goal.  - cinacalcet and Auryxia      LOS: 4 Jonathan Mcgrath 11/4/20193:33 PM

## 2018-05-20 NOTE — Care Management (Signed)
Patient Post-Op lap extended right hemicolectomy.  Patient lives at home with son, daughter also lives locally for support.  Patient is a chronic HD patient.  [facility] dialysis liaison aware of admission.  At base line patient is independent and drives himself to HD.  Daughter request additional transportation resources should patient require them after discharge.  RNCM provided daughter with contact information to [facility].  She has already reached out to them to get additional information.  RNCM notified daughter that there is also a Education officer, museum at the clinic who may be able to assist with transportation if indicated.    Patient currently open with [facility].  Patient will require IV antibiotics at discharge.  Will discharge with Unasyn BID.  Daughter states that previously patient was discharged on Unasyn BID, however it didn't work out with the timing of his outpatient HD sessions.  Daughter and patient request to change patient's outpatient HD session to 1015 am.  [facility] HD liaison has confirmed that patient can start outpatient HD at 1015 on Wednesday.  Patient plans to administer his unasyn at 830 am and 830 pm.   RNCM confirmed with Dr. Delaine Lame that it is ok for patient to receive his dose of Unasyn prior to going to dialysis at 1015.  She confirms that this is acceptable.  RNCM also notified Dr. Candiss Norse from nephology. Jermaine from [place] following the case to initiate antibiotics at discharge.

## 2018-05-20 NOTE — Progress Notes (Signed)
Pre HD assessment    05/20/18 1322  Vital Signs  Temp 98.3 F (36.8 C)  Temp Source Oral  Pulse Rate 76  Pulse Rate Source Monitor  Resp 12  BP (!) 150/67  BP Location Right Arm  BP Method Automatic  Patient Position (if appropriate) Lying  Oxygen Therapy  SpO2 93 %  O2 Device Room Air  Pain Assessment  Pain Scale 0-10  Pain Score 0  Dialysis Weight  Weight 92.7 kg  Type of Weight Pre-Dialysis  Time-Out for Hemodialysis  What Procedure? HD  Pt Identifiers(min of two) First/Last Name;MRN/Account#  Correct Site? Yes  Correct Side? Yes  Correct Procedure? Yes  Consents Verified? Yes  Rad Studies Available? N/A  Safety Precautions Reviewed? Yes  Engineer, civil (consulting) Number  (1A)  Station Number 3  UF/Alarm Test Passed  Conductivity: Meter 13.8  Conductivity: Machine  14.1  pH 7.6  Reverse Osmosis main  Normal Saline Lot Number [id number]  Dialyzer Lot Number [id number]  Disposable Set Lot Number [id number]  Machine Temperature 98.6 F (37 C)  Musician and Audible Yes  Blood Lines Intact and Secured Yes  Pre Treatment Patient Checks  Vascular access used during treatment Fistula  Hepatitis B Surface Antigen Results Negative  Date Hepatitis B Surface Antigen Drawn 09/05/17  Hepatitis B Surface Antibody  (>10)  Date Hepatitis B Surface Antibody Drawn 09/05/17  Hemodialysis Consent Verified Yes  Hemodialysis Standing Orders Initiated Yes  ECG (Telemetry) Monitor On Yes  Prime Ordered Normal Saline  Length of  DialysisTreatment -hour(s) 3.5 Hour(s)  Dialyzer Elisio 17H NR  Dialysate 3K, 2.5 Ca  Dialysis Anticoagulant None  Dialysate Flow Ordered 600  Blood Flow Rate Ordered 400 mL/min  Ultrafiltration Goal 1 Liters  Pre Treatment Labs Renal panel;CBC  Dialysis Blood Pressure Support Ordered Normal Saline  Education / Care Plan  Dialysis Education Provided Yes  Documented Education in Care Plan Yes  Fistula / Graft Left Upper arm Arteriovenous fistula   No Placement Date or Time found.   Placed prior to admission: Yes  Orientation: Left  Access Location: Upper arm  Access Type: Arteriovenous fistula  Site Condition No complications  Fistula / Graft Assessment Present;Thrill;Bruit  Drainage Description None

## 2018-05-20 NOTE — Care Management Important Message (Signed)
Copy of signed IM left with patient in room.  

## 2018-05-21 NOTE — Discharge Summary (Signed)
Physician Discharge Summary  Patient ID: JAVONNE DORKO MRN: [id number] DOB/AGE: December 21, 1940 77 y.o.  Admit date: 05/16/2018 Discharge date: 05/21/2018  Admission Diagnoses: Ascending colon cancer  Discharge Diagnoses:  Same as above  Discharged Condition: good  Hospital Course: Patient admitted after elective laparoscopic extended right hemicolectomy for ascending colon cancer.  Postop recovered without any issues with return of bowel function advancement of diet as tolerated.  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  Per family request physical therapy and home health arrangements were made to continue with IV antibiotic infusion as well as continued physical therapy at home.  At time of discharge all arrangements were made for follow-up appointments were scheduled.  Consults: ID, nephrology and PT  Discharge Exam: Blood pressure (!) 169/64, pulse 77, temperature 98 F (36.7 C), temperature source Oral, resp. rate 16, height 6\' 1"  (1.854 m), weight 90.8 kg, SpO2 96 %. General appearance: alert, cooperative and no distress Resp: clear to auscultation bilaterally Cardio: regular rate and rhythm GI: soft, non-tender; bowel sounds normal; no masses,  no organomegaly and staple line and port site incisions c/d/i.  Disposition:  Discharge disposition: 01-Home or Self Care       Discharge Instructions    Discharge patient   Complete by:  As directed    Discharge disposition:  01-Home or Self Care   Discharge patient date:  05/20/2018   Face-to-face encounter (required for Medicare/Medicaid patients)   Complete by:  As directed    I Benjamine Sprague certify that this patient is under my care and that I, or a nurse practitioner or physician's assistant working with me, had a face-to-face encounter that meets the physician face-to-face encounter requirements with this  patient on 05/20/2018. The encounter with the patient was in whole, or in part for the following medical condition(s) which is the primary reason for home health care (List medical condition): colon CA   The encounter with the patient was in whole, or in part, for the following medical condition, which is the primary reason for home health care:  colon CA   I certify that, based on my findings, the following services are medically necessary home health services:   Nursing Physical therapy     Reason for Medically Necessary Home Health Services:  Therapy- Personnel officer, Public librarian   My clinical findings support the need for the above services:  Unable to leave home safely without assistance and/or assistive device   Further, I certify that my clinical findings support that this patient is homebound due to:  Unable to leave home safely without assistance   Home Health   Complete by:  As directed    To provide the following care/treatments:   PT RN     Home infusion instructions [facility] May follow [place] Dosing Protocol; May administer Cathflo as needed to maintain patency of vascular access device.; Flushing of vascular access device: per [facility] Protocol: 0.9% NaCl pre/post medica...   Complete by:  As directed    Instructions:  May follow [place] Dosing Protocol   Instructions:  May administer Cathflo as needed to maintain patency of vascular access device.   Instructions:  Flushing of vascular access device: per [facility] Protocol: 0.9% NaCl pre/post medication administration and prn patency; Heparin 100 u/ml, 106ml for implanted ports and Heparin 10u/ml, 25ml for all other central venous catheters.   Instructions:  May  follow [facility] Anaphylaxis Protocol for First Dose Administration in the home: 0.9% NaCl at 25-50 ml/hr to maintain IV access for protocol meds. Epinephrine 0.3 ml IV/IM PRN and Benadryl 25-50 IV/IM PRN s/s of anaphylaxis.   Instructions:  [place] Infusion Coordinator (RN) to assist per patient IV care needs in the home PRN.     Allergies as of 05/20/2018   No Known Allergies     Medication List    STOP taking these medications   vancomycin  IVPB     TAKE these medications   acetaminophen 325 MG tablet Commonly known as:  TYLENOL Take 2 tablets (650 mg total) by mouth every 8 (eight) hours as needed for mild pain.   amLODipine 5 MG tablet Commonly known as:  NORVASC Take 1 tablet (5 mg total) by mouth daily.   cinacalcet 30 MG tablet Commonly known as:  SENSIPAR Take 30 mg by mouth every morning.   docusate sodium 100 MG capsule Commonly known as:  COLACE Take 1 capsule (100 mg total) by mouth 2 (two) times daily as needed for up to 10 days for mild constipation.   ferric citrate 1 GM 210 MG(Fe) tablet Commonly known as:  AURYXIA Take 210 mg by mouth daily.   HYDROcodone-acetaminophen 5-325 MG tablet Commonly known as:  NORCO/VICODIN Take 1 tablet by mouth every 6 (six) hours as needed for up to 3 days for moderate pain.   ibuprofen 800 MG tablet Commonly known as:  ADVIL,MOTRIN Take 1 tablet (800 mg total) by mouth every 8 (eight) hours as needed for mild pain or moderate pain.   Menthol (Topical Analgesic) 16 % Crea Apply 1 application topically daily as needed (neuropathy related leg pain).   multivitamin Tabs tablet Take 1 tablet by mouth daily.   piperacillin-tazobactam  IVPB Commonly known as:  ZOSYN Inject 2.25 g into the vein every 12 (twelve) hours for 23 days. Indication: Clostridium septicum bacteremia Last Day of Therapy:  06/13/2018 Labs - every Tuesday:  CBC/D and CMP   VITAMIN D PO Take 2 capsules by mouth 3 (three) times a week. Given during dialysis            Home Infusion Instuctions  (From admission, onward)         Start     Ordered   05/20/18 0000  Home infusion instructions [facility] May follow [place] Dosing Protocol; May administer Cathflo as  needed to maintain patency of vascular access device.; Flushing of vascular access device: per [facility] Protocol: 0.9% NaCl pre/post medica...    Question Answer Comment  Instructions May follow [place] Dosing Protocol   Instructions May administer Cathflo as needed to maintain patency of vascular access device.   Instructions Flushing of vascular access device: per [facility] Protocol: 0.9% NaCl pre/post medication administration and prn patency; Heparin 100 u/ml, 57ml for implanted ports and Heparin 10u/ml, 27ml for all other central venous catheters.   Instructions May follow [facility] Anaphylaxis Protocol for First Dose Administration in the home: 0.9% NaCl at 25-50 ml/hr to maintain IV access for protocol meds. Epinephrine 0.3 ml IV/IM PRN and Benadryl 25-50 IV/IM PRN s/s of anaphylaxis.   Instructions [facility] Infusion Coordinator (RN) to assist per patient IV care needs in the home PRN.      05/20/18 1553         Follow-up Information    [name], Shaneisha Burkel, DO Follow up in 2 week(s).   Specialty:  Surgery Why:  Please call Dr. Ines Bloomer office tomorrow morning to schedule a followup in two weeks.  [phone] Contact information: [address] [place] [address] [phone]            Total time spent arranging discharge was >35min. Signed: Benjamine Sprague 05/21/2018, 11:00 AM

## 2018-05-22 LAB — CULTURE, BLOOD (ROUTINE X 2)
Culture: NO GROWTH
Culture: NO GROWTH
Special Requests: ADEQUATE

## 2018-05-24 LAB — SURGICAL PATHOLOGY

## 2018-05-27 ENCOUNTER — Telehealth: Payer: Self-pay | Admitting: *Deleted

## 2018-05-27 NOTE — Telephone Encounter (Signed)
-----   Message from Secundino Ginger sent at 05/27/2018  1:45 PM EST ----- Regarding: CALL BACK Contact: [phone] [place] CALLED AND WANTS TO TALK TO YOU ABOUT HER FATHER Jonathan Mcgrath. HER NUMBER IS [phone]

## 2018-05-27 NOTE — Telephone Encounter (Signed)
[name] called and wanted me to call back. When I called her voicemail is full and hangs up. Will try later

## 2018-05-28 ENCOUNTER — Encounter: Payer: Self-pay | Admitting: Oncology

## 2018-05-28 ENCOUNTER — Telehealth: Payer: Self-pay | Admitting: *Deleted

## 2018-05-28 ENCOUNTER — Inpatient Hospital Stay: Payer: Medicare HMO | Attending: Oncology | Admitting: Oncology

## 2018-05-28 VITALS — BP 176/77 | HR 77 | Temp 97.9°F | Resp 18 | Ht 73.0 in | Wt 200.0 lb

## 2018-05-28 DIAGNOSIS — D490 Neoplasm of unspecified behavior of digestive system: Secondary | ICD-10-CM

## 2018-05-28 DIAGNOSIS — C9110 Chronic lymphocytic leukemia of B-cell type not having achieved remission: Secondary | ICD-10-CM | POA: Diagnosis not present

## 2018-05-28 DIAGNOSIS — D631 Anemia in chronic kidney disease: Secondary | ICD-10-CM | POA: Diagnosis not present

## 2018-05-28 DIAGNOSIS — D696 Thrombocytopenia, unspecified: Secondary | ICD-10-CM | POA: Diagnosis not present

## 2018-05-28 DIAGNOSIS — Z79899 Other long term (current) drug therapy: Secondary | ICD-10-CM | POA: Insufficient documentation

## 2018-05-28 DIAGNOSIS — E119 Type 2 diabetes mellitus without complications: Secondary | ICD-10-CM | POA: Diagnosis not present

## 2018-05-28 DIAGNOSIS — N186 End stage renal disease: Secondary | ICD-10-CM | POA: Diagnosis not present

## 2018-05-28 DIAGNOSIS — C189 Malignant neoplasm of colon, unspecified: Secondary | ICD-10-CM | POA: Diagnosis not present

## 2018-05-28 DIAGNOSIS — Z992 Dependence on renal dialysis: Secondary | ICD-10-CM | POA: Diagnosis not present

## 2018-05-28 DIAGNOSIS — I10 Essential (primary) hypertension: Secondary | ICD-10-CM | POA: Insufficient documentation

## 2018-05-28 DIAGNOSIS — N185 Chronic kidney disease, stage 5: Secondary | ICD-10-CM

## 2018-05-28 NOTE — Progress Notes (Signed)
No new changes noted today 

## 2018-05-28 NOTE — Telephone Encounter (Signed)
Called daughter yest. And her cell phone full and could not leave a message.  I called this morning and she wanted me to know that the cancer was just in spot and all lymph nodes were neg. And patient will come today

## 2018-05-28 NOTE — Telephone Encounter (Signed)
-----   Message from Secundino Ginger sent at 05/27/2018  1:45 PM EST ----- Regarding: CALL BACK Contact: [phone] [place] CALLED AND WANTS TO TALK TO YOU ABOUT HER FATHER Jonathan Mcgrath. HER NUMBER IS [phone]

## 2018-05-30 NOTE — Progress Notes (Signed)
Hematology/Oncology Consult note Jonathan Mcgrath  [phone] [phone]  Patient Care Team: [facility], [facility] [place] as PCP - General Clent Jacks, RN as Registered Nurse   Name of the [name]  [id number]  1941-02-18   Date of visit: 05/30/18  Diagnosis- 1. Rai stage 0 CLL 2. Stage I colon cancer 3. Anemia secondary to chronic kidney disease  Chief complaint/ Reason for visit- discuss pathology results and further management  Heme/Onc history: Patient is a 77 yr old male with a PMH significant for HTN and ESRD on HD. He presented with fever, diarrhea and vomiting. Blood cultures showed clostridium septicum. CT abdomen showed aorto iliac arteritis. ID was consulted and they recommended colonoscopy as it can be associated with colon cancer. He has never had EGD or colonoscopy before. Colonoscopy showed non obstructing mass in the ascending colon. 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 CT chest abdomen pelvis did not reveal evidence of metastatic disease.  Patient is still getting antibiotics for his Clostridium septicum infection.  He has seen Dr. [name] and will be undergoing surgery next week.  Patient also found to have chronic lymphocytosis with waxing and waning white count between 12-19.  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  He also has anemia of chronic kidney disease and his baseline hemoglobin is around 10  Interval history- he is doing well post surgery. Bowel movements are regular. Denies any abdominal pain, nausea or vomiting  ECOG PS- 1 Pain scale- 0 Opioid associated constipation- no  Review of systems- Review of Systems  Constitutional: Negative for chills, fever, malaise/fatigue and weight loss.  HENT: Negative for congestion, ear discharge and nosebleeds.   Eyes: Negative for blurred vision.  Respiratory: Negative for cough, hemoptysis, sputum production, shortness of breath  and wheezing.   Cardiovascular: Negative for chest pain, palpitations, orthopnea and claudication.  Gastrointestinal: Negative for abdominal pain, blood in stool, constipation, diarrhea, heartburn, melena, nausea and vomiting.  Genitourinary: Negative for dysuria, flank pain, frequency, hematuria and urgency.  Musculoskeletal: Negative for back pain, joint pain and myalgias.  Skin: Negative for rash.  Neurological: Negative for dizziness, tingling, focal weakness, seizures, weakness and headaches.  Endo/Heme/Allergies: Does not bruise/bleed easily.  Psychiatric/Behavioral: Negative for depression and suicidal ideas. The patient does not have insomnia.        No Known Allergies   Past Medical History:  Diagnosis Date  . Cancer ([place]) 2019   colon  . Chronic kidney disease   . Diabetes mellitus without complication (HCC)    diet controlled  . Hemodialysis access site with arteriovenous graft ([place])   . Hypertension   . Renal insufficiency    Pt on dialysis x 3 years and receives every Monday, Wednesday and Friday.      Past Surgical History:  Procedure Laterality Date  . AV FISTULA PLACEMENT Left 2015   arm  . CATARACT EXTRACTION W/ INTRAOCULAR LENS  IMPLANT, BILATERAL Bilateral 08/2017  . CENTRAL LINE INSERTION-TUNNELED N/A 04/19/2018   Procedure: CENTRAL LINE INSERTION-TUNNELED;  Surgeon: Delana Meyer Dolores Lory, MD;  Location: [place];  Service: Cardiovascular;  Laterality: N/A;  . COLON RESECTION N/A 05/16/2018   Procedure: LAPAROSCOPIC  COLON RESECTION;  Surgeon: Herbert Pun, MD;  Location: [facility];  Service: General;  Laterality: N/A;  . COLONOSCOPY N/A 04/18/2018   Procedure: COLONOSCOPY;  Surgeon: Virgel Manifold, MD;  Location: [facility];  Service: Endoscopy;  Laterality: N/A;  . ESOPHAGOGASTRODUODENOSCOPY N/A 04/18/2018  Procedure: ESOPHAGOGASTRODUODENOSCOPY (EGD);  Surgeon: Virgel Manifold, MD;  Location[facility];  Service:  Endoscopy;  Laterality: N/A;  . EYE SURGERY    . PERIPHERAL VASCULAR CATHETERIZATION Left 01/12/2016   Procedure: A/V Shuntogram/Fistulagram;  Surgeon: Algernon Huxley, MD;  Location: [place] [facility];  Service: Cardiovascular;  Laterality: Left;  . PERIPHERAL VASCULAR CATHETERIZATION N/A 01/12/2016   Procedure: A/V Shunt Intervention;  Surgeon: Algernon Huxley, MD;  Location: [place] [facility];  Service: Cardiovascular;  Laterality: N/A;  . PERITONEAL CATHETER INSERTION N/A   . REMOVAL OF A DIALYSIS CATHETER N/A 01/14/2015   Procedure: REMOVAL OF A  PERITONEAL DIALYSIS CATHETER;  Surgeon: Algernon Huxley, MD;  Location: [facility];  Service: Vascular;  Laterality: N/A;  . TEE WITHOUT CARDIOVERSION N/A 04/19/2018   Procedure: TRANSESOPHAGEAL ECHOCARDIOGRAM (TEE);  Surgeon: Teodoro Spray, MD;  Location: [facility];  Service: Cardiovascular;  Laterality: N/A;    Social History   Socioeconomic History  . Marital status: Married    Spouse name: Not on file  . Number of children: Not on file  . Years of education: Not on file  . Highest education level: Not on file  Occupational History  . Occupation: retired  Scientific laboratory technician  . Financial resource strain: Not on file  . Food insecurity:    Worry: Not on file    Inability: Not on file  . Transportation needs:    Medical: Not on file    Non-medical: Not on file  Tobacco Use  . Smoking status: Never Smoker  . Smokeless tobacco: Never Used  Substance and Sexual Activity  . Alcohol use: No  . Drug use: No  . Sexual activity: Not on file  Lifestyle  . Physical activity:    Days per week: Not on file    Minutes per session: Not on file  . Stress: Not on file  Relationships  . Social connections:    Talks on phone: Not on file    Gets together: Not on file    Attends religious service: Not on file    Active member of club or organization: Not on file    Attends meetings of clubs or organizations: Not on file    Relationship status: Not on file   . Intimate partner violence:    Fear of current or ex partner: Not on file    Emotionally abused: Not on file    Physically abused: Not on file    Forced sexual activity: Not on file  Other Topics Concern  . Not on file  Social History Narrative  . Not on file    Family History  Problem Relation Age of Onset  . Diabetes Mother   . Heart disease Father      Current Outpatient Medications:  .  acetaminophen (TYLENOL) 325 MG tablet, Take 2 tablets (650 mg total) by mouth every 8 (eight) hours as needed for mild pain., Disp: 40 tablet, Rfl: 0 .  amLODipine (NORVASC) 5 MG tablet, Take 1 tablet (5 mg total) by mouth daily., Disp: 30 tablet, Rfl: 0 .  Cholecalciferol (VITAMIN D PO), Take 2 capsules by mouth 3 (three) times a week. Given during dialysis, Disp: , Rfl:  .  cinacalcet (SENSIPAR) 30 MG tablet, Take 30 mg by mouth every morning., Disp: , Rfl:  .  docusate sodium (COLACE) 100 MG capsule, Take 1 capsule (100 mg total) by mouth 2 (two) times daily as needed for up to 10 days for mild  constipation., Disp: 20 capsule, Rfl: 0 .  ferric citrate (AURYXIA) 1 GM 210 MG(Fe) tablet, Take 210 mg by mouth daily. , Disp: , Rfl:  .  multivitamin (RENA-VIT) TABS tablet, Take 1 tablet by mouth daily., Disp: , Rfl: 11 .  piperacillin-tazobactam (ZOSYN) IVPB, Inject 2.25 g into the vein every 12 (twelve) hours for 23 days. Indication: Clostridium septicum bacteremia Last Day of Therapy:  06/13/2018 Labs - every Tuesday:  CBC/D and CMP, Disp: 46 Units, Rfl: 0 .  ibuprofen (ADVIL,MOTRIN) 800 MG tablet, Take 1 tablet (800 mg total) by mouth every 8 (eight) hours as needed for mild pain or moderate pain. (Patient not taking: Reported on 05/28/2018), Disp: 30 tablet, Rfl: 0 .  Menthol, Topical Analgesic, 16 % CREA, Apply 1 application topically daily as needed (neuropathy related leg pain)., Disp: , Rfl:   Physical exam:  Vitals:   05/28/18 1441  BP: (!) 176/77  Pulse: 77  Resp: 18  Temp: 97.9 F  (36.6 C)  TempSrc: Tympanic  SpO2: 96%  Weight: 200 lb (90.7 kg)  Height: 6\' 1"  (1.854 m)   Physical Exam  Constitutional: He is oriented to person, place, and time. He appears well-developed and well-nourished.  HENT:  Head: Normocephalic and atraumatic.  Eyes: Pupils are equal, round, and reactive to light. EOM are normal.  Neck: Normal range of motion.  Cardiovascular: Normal rate, regular rhythm and normal heart sounds.  Pulmonary/Chest: Effort normal and breath sounds normal.  Abdominal: Soft. Bowel sounds are normal.  surgical scar has healed well  Neurological: He is alert and oriented to person, place, and time.  Skin: Skin is warm and dry.     CMP Latest Ref Rng & Units 05/19/2018  Glucose 70 - 99 mg/dL 78  BUN 8 - 23 mg/dL 17  Creatinine 0.61 - 1.24 mg/dL 7.26(H)  Sodium 135 - 145 mmol/L 140  Potassium 3.5 - 5.1 mmol/L 3.7  Chloride 98 - 111 mmol/L 101  CO2 22 - 32 mmol/L 29  Calcium 8.9 - 10.3 mg/dL 7.7(L)  Total Protein 6.5 - 8.1 g/dL -  Total Bilirubin 0.3 - 1.2 mg/dL -  Alkaline Phos 38 - 126 U/L -  AST 15 - 41 U/L -  ALT 0 - 44 U/L -   CBC Latest Ref Rng & Units 05/18/2018  WBC 4.0 - 10.5 K/uL 11.4(H)  Hemoglobin 13.0 - 17.0 g/dL 8.0(L)  Hematocrit 39.0 - 52.0 % 24.5(L)  Platelets 150 - 400 K/uL 136(L)    No images are attached to the encounter.  Ct Angio Abdomen W &/or Wo Contrast  Result Date: 05/14/2018 CLINICAL DATA:  Infective aortitis. EXAM: CT ANGIOGRAPHY ABDOMEN TECHNIQUE: Multidetector CT imaging of the abdomen was performed using the standard protocol during bolus administration of intravenous contrast. Multiplanar reconstructed images and MIPs were obtained and reviewed to evaluate the vascular anatomy. CONTRAST:  157mL ISOVUE-370 IOPAMIDOL (ISOVUE-370) INJECTION 76% COMPARISON:  CT abdomen pelvis 04/15/2018; 04/12/2018; 01/28/2011; chest CT-04/18/2018 FINDINGS: VASCULAR Aorta: There is a minimal amount mixed calcified and noncalcified  atherosclerotic plaque scattered throughout the normal caliber abdominal aorta, not resulting in a hemodynamically significant stenosis. Previously noted ill-defined stranding involving the distal aspect of the abdominal aorta has nearly resolved in the interval. No definite abdominal aortic dissection. Celiac: There is a minimal amount of calcified atherosclerotic plaque involving the origin of the celiac artery, not resulting in hemodynamically significant stenosis. The right inferior phrenic artery is noted to arise directly from the more proximal abdominal aorta. Conventional  branching pattern. No vessel irregularity to suggest FMD. SMA: There is a minimal amount of circumferential mixed calcified and noncalcified atherosclerotic plaque involving the origin the celiac artery, not resulting in hemodynamically significant stenosis. Conventional branching pattern. The distal tributaries of the SMA appear widely patent without discrete intraluminal filling defect to suggest distal embolism. Renals: Solitary bilaterally. There is a minimal amount of noncalcified atherosclerotic plaque involving the origin of the right renal artery (image 73, series 9), which approaches 50% luminal narrowing (image 73, series 9). The left renal artery is widely patent without hemodynamically significant narrowing. No vessel irregularity suggest FMD. IMA: Widely patent without hemodynamically significant narrowing. Inflow: There 2 contained penetrating atherosclerotic ulcers involving the left common iliac artery with dominant ulcer measuring approximately 1.6 x 0.7 cm (coronal image 81, series 16) and additional smaller ulcer measuring approximately 0.7 x 0.4 cm (coronal image 79, series 16). This finding is again associated with adjacent ill-defined stranding about the left common iliac artery, this appears improved in the interval. No contrast extravasation. There is a minimal amount of mixed calcified and noncalcified  atherosclerotic plaque involving the right common iliac artery, not resulting in a hemodynamically significant stenosis. Veins: The IVC and proximal aspect of the pelvic venous system appear patent on this arterial phase examination. Review of the MIP images confirms the above findings. _________________________________________________________ NON-VASCULAR Lower chest: Limited visualization the lower thorax demonstrates improved aeration of lung bases with residual subsegmental and subpleural atelectasis. No discrete focal airspace opacities. No pleural effusion. Cardiomegaly.  No pericardial effusion. Hepatobiliary: Normal hepatic contour. There is a minimal amount of focal fatty infiltration adjacent to the fissure for the ligamentum teres. No discrete hepatic lesions. Normal appearance of the gallbladder given degree distention. No radiopaque gallstones. No intra or extrahepatic biliary ductal dilatation. No ascites. Pancreas: Normal appearance of the pancreas Spleen: Normal appearance of the spleen Adrenals/Urinary Tract: There is symmetric enhancement of the bilateral kidneys. Calcifications about the right renal hila are unchanged in favored to be vascular in etiology. Unchanged appearance of ill-defined approximately 1.8 cm hypoattenuating nonenhancing cyst within the anterior superior aspect the left kidney (axial image 30, series 20, coronal image 97, series 22; coronal image 99, series 12) and appears morphologically similar to the 01/2011 examination. No discrete right-sided renal lesions. There is a minimal amount of likely age and body habitus related perinephric stranding. No urinary obstruction. Normal appearance of the bilateral adrenal glands. The urinary bladder was not imaged. Stomach/Bowel: Small hiatal hernia. Imaged loops of bowel are normal in course and caliber without wall thickening or evidence of enteric obstruction. No pneumoperitoneum, pneumatosis or portal venous gas. Lymphatic: No  bulky retroperitoneal, mesenteric, pelvic or inguinal lymphadenopathy. Other: Calcification about the anterior aspect the left rectus musculature may be posttraumatic in etiology. Minimal subcutaneous edema most conspicuous about the midline of the low back. Musculoskeletal: No acute or aggressive osseous abnormalities. Lucency involving the posterior aspect the right ilium is unchanged compared to the 01/2011 examination without associated periostitis and favored to represent a benign bone cyst. Suspected intraosseous hemangioma within the within the L2 vertebral body, similar to the 01/2011 examination. Mild-to-moderate multilevel degenerative change, worse at L2-L3 with disc space height loss, endplate irregularity and small posteriorly directed disc osteophyte complex at this location. IMPRESSION: 1. 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 No contrast extravasation. 2. Persistent though reduced perivascular stranding surrounding the distal abdominal aorta and left common iliac  artery. 3. Minimal of atherosclerotic plaque with a normal caliber abdominal aorta. Aortic Atherosclerosis (ICD10-I70.0). 4. Suspected hemodynamically significant stenosis involving the origin of the right renal artery without associated asymmetric renal atrophy delayed enhancement or excretion. 5. No acute findings within the abdomen. Electronically Signed   By: Sandi Mariscal M.D.   On: 05/14/2018 11:15     Assessment and plan- Patient is a 77 y.o. male with following issues:  1.  Stage I colon cancer: I discussed the results of the pathology with the patient in detail.  He was found to have a moderately differentiated 7.2 cm tumor with negative margins.  17 lymph nodes were negative for malignancy.  Pathologic stage was T2N0.  He therefore has stage I disease.  He does not require any adjuvant chemotherapy or surveillance  scans in the future.  He will require repeat colonoscopy in 1 year from his surgery which the patient tells me will be arranged by Dr. [name].  As such she does not need to follow-up with oncology for his colon cancer  2.  Right stage 0 CLL: Patient has a stable white count between 11-12.  He is more anemic today as compared to his baseline of 10 likely secondary to surgery.  His baseline anemia of 10 is likely secondary to anemia of chronic kidney disease as he is on dialysis.  With regards to his anemia I will see him back in 2 months time with repeat, ferritin and iron studies B12 and folate.  He also has mild stable thrombocytopenia and his platelet count typically runs in the 130s which we will continue to monitor  Cancer Staging Colon cancer [facility]) Staging form: Colon and Rectum, AJCC 8th Edition - Pathologic stage from 05/28/2018: Stage I (pT2, pN0, cM0) - Signed by Sindy Guadeloupe, MD on 05/30/2018     Visit Diagnosis 1. CLL (chronic lymphocytic leukemia) ([place])   2. Colon neoplasm   3. Anemia of chronic kidney failure, stage 5 (HCC)      Dr. Randa Evens, MD, MPH [facility] [phone] 05/30/2018 8:15 AM

## 2018-05-31 ENCOUNTER — Ambulatory Visit: Payer: Medicare HMO | Admitting: Oncology

## 2018-06-04 ENCOUNTER — Telehealth: Payer: Self-pay | Admitting: Licensed Clinical Social Worker

## 2018-06-04 NOTE — Telephone Encounter (Signed)
Patient experiencing diarrhea 1-2 times a day due to IV ABX and wanted to what he could take. C-Diff test taken 05/29/18 was negative. Per Dr. Delaine Lame it is ok for patient to probiotic and imodium. Patient understood.

## 2018-06-11 ENCOUNTER — Telehealth: Payer: Self-pay | Admitting: Licensed Clinical Social Worker

## 2018-06-11 NOTE — Telephone Encounter (Signed)
Appointment scheduled for tunneled PICC removal for 06/19/2018, patient will need to go to the medical mall at [facility] and check in by 11:45am. I was unable to speak to the patient but I left a message with his home health nurse Sarah. [phone]

## 2018-06-16 ENCOUNTER — Inpatient Hospital Stay
Admission: EM | Admit: 2018-06-16 | Discharge: 2018-06-20 | DRG: 177 | Disposition: A | Discharge status: Skilled Nursing Facility | Payer: Medicare HMO | Attending: Internal Medicine | Admitting: Internal Medicine

## 2018-06-16 ENCOUNTER — Other Ambulatory Visit: Payer: Self-pay

## 2018-06-16 ENCOUNTER — Emergency Department: Payer: Medicare HMO

## 2018-06-16 DIAGNOSIS — Z8249 Family history of ischemic heart disease and other diseases of the circulatory system: Secondary | ICD-10-CM | POA: Diagnosis not present

## 2018-06-16 DIAGNOSIS — D631 Anemia in chronic kidney disease: Secondary | ICD-10-CM | POA: Diagnosis present

## 2018-06-16 DIAGNOSIS — N186 End stage renal disease: Secondary | ICD-10-CM | POA: Diagnosis present

## 2018-06-16 DIAGNOSIS — Z9841 Cataract extraction status, right eye: Secondary | ICD-10-CM

## 2018-06-16 DIAGNOSIS — Z992 Dependence on renal dialysis: Secondary | ICD-10-CM

## 2018-06-16 DIAGNOSIS — C9110 Chronic lymphocytic leukemia of B-cell type not having achieved remission: Secondary | ICD-10-CM | POA: Diagnosis present

## 2018-06-16 DIAGNOSIS — M898X9 Other specified disorders of bone, unspecified site: Secondary | ICD-10-CM | POA: Diagnosis present

## 2018-06-16 DIAGNOSIS — I776 Arteritis, unspecified: Secondary | ICD-10-CM | POA: Diagnosis present

## 2018-06-16 DIAGNOSIS — Z79899 Other long term (current) drug therapy: Secondary | ICD-10-CM | POA: Diagnosis not present

## 2018-06-16 DIAGNOSIS — E782 Mixed hyperlipidemia: Secondary | ICD-10-CM | POA: Diagnosis present

## 2018-06-16 DIAGNOSIS — R112 Nausea with vomiting, unspecified: Secondary | ICD-10-CM

## 2018-06-16 DIAGNOSIS — R197 Diarrhea, unspecified: Secondary | ICD-10-CM

## 2018-06-16 DIAGNOSIS — E1122 Type 2 diabetes mellitus with diabetic chronic kidney disease: Secondary | ICD-10-CM | POA: Diagnosis present

## 2018-06-16 DIAGNOSIS — Z961 Presence of intraocular lens: Secondary | ICD-10-CM | POA: Diagnosis present

## 2018-06-16 DIAGNOSIS — Z66 Do not resuscitate: Secondary | ICD-10-CM | POA: Diagnosis present

## 2018-06-16 DIAGNOSIS — N2581 Secondary hyperparathyroidism of renal origin: Secondary | ICD-10-CM | POA: Diagnosis present

## 2018-06-16 DIAGNOSIS — R0602 Shortness of breath: Secondary | ICD-10-CM

## 2018-06-16 DIAGNOSIS — I5033 Acute on chronic diastolic (congestive) heart failure: Secondary | ICD-10-CM | POA: Diagnosis present

## 2018-06-16 DIAGNOSIS — J9601 Acute respiratory failure with hypoxia: Secondary | ICD-10-CM | POA: Diagnosis present

## 2018-06-16 DIAGNOSIS — Z833 Family history of diabetes mellitus: Secondary | ICD-10-CM | POA: Diagnosis not present

## 2018-06-16 DIAGNOSIS — I132 Hypertensive heart and chronic kidney disease with heart failure and with stage 5 chronic kidney disease, or end stage renal disease: Secondary | ICD-10-CM | POA: Diagnosis present

## 2018-06-16 DIAGNOSIS — J690 Pneumonitis due to inhalation of food and vomit: Principal | ICD-10-CM | POA: Diagnosis present

## 2018-06-16 DIAGNOSIS — C189 Malignant neoplasm of colon, unspecified: Secondary | ICD-10-CM

## 2018-06-16 DIAGNOSIS — Z9842 Cataract extraction status, left eye: Secondary | ICD-10-CM | POA: Diagnosis not present

## 2018-06-16 DIAGNOSIS — C180 Malignant neoplasm of cecum: Secondary | ICD-10-CM | POA: Diagnosis present

## 2018-06-16 LAB — HEPATIC FUNCTION PANEL
ALT: 16 U/L (ref 0–44)
AST: 26 U/L (ref 15–41)
Albumin: 3.3 g/dL — ABNORMAL LOW (ref 3.5–5.0)
Alkaline Phosphatase: 67 U/L (ref 38–126)
Bilirubin, Direct: 0.2 mg/dL (ref 0.0–0.2)
Indirect Bilirubin: 1.1 mg/dL — ABNORMAL HIGH (ref 0.3–0.9)
Total Bilirubin: 1.3 mg/dL — ABNORMAL HIGH (ref 0.3–1.2)
Total Protein: 6.1 g/dL — ABNORMAL LOW (ref 6.5–8.1)

## 2018-06-16 LAB — BASIC METABOLIC PANEL
Anion gap: 13 (ref 5–15)
BUN: 46 mg/dL — ABNORMAL HIGH (ref 8–23)
CO2: 29 mmol/L (ref 22–32)
Calcium: 8.1 mg/dL — ABNORMAL LOW (ref 8.9–10.3)
Chloride: 101 mmol/L (ref 98–111)
Creatinine, Ser: 8.37 mg/dL — ABNORMAL HIGH (ref 0.61–1.24)
GFR calc Af Amer: 6 mL/min — ABNORMAL LOW (ref >60)
GFR calc non Af Amer: 6 mL/min — ABNORMAL LOW (ref >60)
Glucose, Bld: 118 mg/dL — ABNORMAL HIGH (ref 70–99)
Potassium: 3.4 mmol/L — ABNORMAL LOW (ref 3.5–5.1)
Sodium: 143 mmol/L (ref 135–145)

## 2018-06-16 LAB — CBC WITH DIFFERENTIAL/PLATELET
Abs Immature Granulocytes: 0.22 10*3/uL — ABNORMAL HIGH (ref 0.00–0.07)
Basophils Absolute: 0 10*3/uL (ref 0.0–0.1)
Basophils Relative: 0 %
Eosinophils Absolute: 0 10*3/uL (ref 0.0–0.5)
Eosinophils Relative: 0 %
HCT: 23 % — ABNORMAL LOW (ref 39.0–52.0)
Hemoglobin: 7.5 g/dL — ABNORMAL LOW (ref 13.0–17.0)
Immature Granulocytes: 1 %
Lymphocytes Relative: 45 %
Lymphs Abs: 7.1 10*3/uL — ABNORMAL HIGH (ref 0.7–4.0)
MCH: 30.5 pg (ref 26.0–34.0)
MCHC: 32.6 g/dL (ref 30.0–36.0)
MCV: 93.5 fL (ref 80.0–100.0)
Monocytes Absolute: 1 10*3/uL (ref 0.1–1.0)
Monocytes Relative: 6 %
Neutro Abs: 7.5 10*3/uL (ref 1.7–7.7)
Neutrophils Relative %: 48 %
Platelets: 122 10*3/uL — ABNORMAL LOW (ref 150–400)
RBC: 2.46 MIL/uL — ABNORMAL LOW (ref 4.22–5.81)
RDW: 18.3 % — ABNORMAL HIGH (ref 11.5–15.5)
WBC: 15.8 10*3/uL — ABNORMAL HIGH (ref 4.0–10.5)
nRBC: 0 % (ref 0.0–0.2)

## 2018-06-16 LAB — BLOOD GAS, ARTERIAL
Acid-Base Excess: 7.6 mmol/L — ABNORMAL HIGH (ref 0.0–2.0)
Bicarbonate: 32 mmol/L — ABNORMAL HIGH (ref 20.0–28.0)
FIO2: 0.44
O2 Saturation: 92.4 %
Patient temperature: 37
pCO2 arterial: 43 mmHg (ref 32.0–48.0)
pH, Arterial: 7.48 — ABNORMAL HIGH (ref 7.350–7.450)
pO2, Arterial: 60 mmHg — ABNORMAL LOW (ref 83.0–108.0)

## 2018-06-16 LAB — TROPONIN I: Troponin I: 0.15 ng/mL (ref <0.03)

## 2018-06-16 LAB — BRAIN NATRIURETIC PEPTIDE: B Natriuretic Peptide: 2154 pg/mL — ABNORMAL HIGH (ref 0.0–100.0)

## 2018-06-16 LAB — LIPASE, BLOOD: Lipase: 31 U/L (ref 11–51)

## 2018-06-16 LAB — LACTIC ACID, PLASMA: Lactic Acid, Venous: 1.3 mmol/L (ref 0.5–1.9)

## 2018-06-16 MED ORDER — ALBUTEROL SULFATE (2.5 MG/3ML) 0.083% IN NEBU: 2.5000 mg | INHALATION_SOLUTION | RESPIRATORY_TRACT | Status: DC | PRN

## 2018-06-16 MED ORDER — ONDANSETRON HCL 4 MG/2ML IJ SOLN
4.0000 mg | Freq: Once | INTRAMUSCULAR | Status: AC
  Administered 2018-06-16: 4 mg via INTRAVENOUS
  Filled 2018-06-16: qty 2

## 2018-06-16 MED ORDER — ONDANSETRON HCL 4 MG PO TABS: 4.0000 mg | ORAL_TABLET | Freq: Four times a day (QID) | ORAL | Status: DC | PRN

## 2018-06-16 MED ORDER — HEPARIN SODIUM (PORCINE) 5000 UNIT/ML IJ SOLN
5000.0000 [IU] | Freq: Three times a day (TID) | INTRAMUSCULAR | Status: DC
  Administered 2018-06-16 – 2018-06-20 (×5): 5000 [IU] via SUBCUTANEOUS
  Filled 2018-06-16 (×6): qty 1

## 2018-06-16 MED ORDER — SODIUM CHLORIDE 0.9 % IV SOLN: 3.0000 g | Freq: Four times a day (QID) | INTRAVENOUS | Status: DC

## 2018-06-16 MED ORDER — CINACALCET HCL 30 MG PO TABS
30.0000 mg | ORAL_TABLET | Freq: Every day | ORAL | Status: DC
  Administered 2018-06-17 – 2018-06-20 (×3): 30 mg via ORAL
  Filled 2018-06-16 (×4): qty 1

## 2018-06-16 MED ORDER — ACETAMINOPHEN 325 MG PO TABS: 650.0000 mg | ORAL_TABLET | Freq: Three times a day (TID) | ORAL | Status: DC | PRN

## 2018-06-16 MED ORDER — VITAMIN D 25 MCG (1000 UNIT) PO TABS
1000.0000 [IU] | ORAL_TABLET | ORAL | Status: DC
  Administered 2018-06-19: 08:00:00 1000 [IU] via ORAL
  Filled 2018-06-16: qty 1

## 2018-06-16 MED ORDER — SODIUM CHLORIDE 0.9 % IV SOLN
1.5000 g | Freq: Once | INTRAVENOUS | Status: AC
  Administered 2018-06-16: 1.5 g via INTRAVENOUS
  Filled 2018-06-16: qty 1.5

## 2018-06-16 MED ORDER — AMLODIPINE BESYLATE 10 MG PO TABS
10.0000 mg | ORAL_TABLET | Freq: Every day | ORAL | Status: DC
  Administered 2018-06-16 – 2018-06-20 (×5): 10 mg via ORAL
  Filled 2018-06-16 (×3): qty 1
  Filled 2018-06-16: qty 2
  Filled 2018-06-16: qty 1

## 2018-06-16 MED ORDER — SODIUM CHLORIDE 0.9 % IV SOLN
3.0000 g | INTRAVENOUS | Status: DC
  Administered 2018-06-17 – 2018-06-19 (×3): 3 g via INTRAVENOUS
  Filled 2018-06-16 (×4): qty 3

## 2018-06-16 MED ORDER — ASPIRIN EC 81 MG PO TBEC
81.0000 mg | DELAYED_RELEASE_TABLET | Freq: Every day | ORAL | Status: DC
  Administered 2018-06-16 – 2018-06-20 (×4): 81 mg via ORAL
  Filled 2018-06-16 (×5): qty 1

## 2018-06-16 MED ORDER — CHLORHEXIDINE GLUCONATE CLOTH 2 % EX PADS
6.0000 | MEDICATED_PAD | Freq: Every day | CUTANEOUS | Status: DC
  Administered 2018-06-17 – 2018-06-20 (×4): 6 via TOPICAL

## 2018-06-16 MED ORDER — FERRIC CITRATE 1 GM 210 MG(FE) PO TABS
210.0000 mg | ORAL_TABLET | Freq: Every day | ORAL | Status: DC
  Administered 2018-06-16 – 2018-06-20 (×4): 210 mg via ORAL
  Filled 2018-06-16 (×5): qty 1

## 2018-06-16 MED ORDER — RENA-VITE PO TABS
1.0000 | ORAL_TABLET | Freq: Every day | ORAL | Status: DC
  Administered 2018-06-16 – 2018-06-20 (×5): 1 via ORAL
  Filled 2018-06-16 (×5): qty 1

## 2018-06-16 MED ORDER — EPOETIN ALFA 4000 UNIT/ML IJ SOLN
4000.0000 [IU] | INTRAMUSCULAR | Status: DC
  Administered 2018-06-17 – 2018-06-19 (×2): 4000 [IU] via INTRAVENOUS
  Filled 2018-06-16 (×2): qty 1

## 2018-06-16 MED ORDER — ONDANSETRON HCL 4 MG/2ML IJ SOLN: 4.0000 mg | Freq: Four times a day (QID) | INTRAMUSCULAR | Status: DC | PRN

## 2018-06-16 MED ORDER — SODIUM CHLORIDE 0.9 % IV SOLN
1.5000 g | Freq: Once | INTRAVENOUS | Status: DC
  Filled 2018-06-16: qty 1.5

## 2018-06-16 NOTE — ED Notes (Signed)
ED TO INPATIENT HANDOFF REPORT  Name/Age/Gender Jonathan Mcgrath 77 y.o. male  Code Status Code Status History    Date Active Date Inactive Code Status Order ID Comments User Context   05/16/2018 1845 05/20/2018 2324 DNR [id number]  Benjamine Sprague, DO Inpatient   04/12/2018 1310 04/20/2018 1541 DNR [id number]  Dustin Flock, MD Inpatient   04/12/2018 0827 04/12/2018 1310 DNR [id number]  Dustin Flock, MD ED   08/17/2016 0352 08/18/2016 2236 Full Code [id number]  Saundra Shelling, MD Inpatient   01/12/2016 1417 01/12/2016 1754 Full Code [id number]  Algernon Huxley, MD Inpatient    Questions for Most Recent Historical Code Status (Order [id number])    Question Answer Comment   In the event of cardiac or respiratory ARREST Do not call a "code blue"    In the event of cardiac or respiratory ARREST Do not perform Intubation, CPR, defibrillation or ACLS    In the event of cardiac or respiratory ARREST Use medication by any route, position, wound care, and other measures to relive pain and suffering. May use oxygen, suction and manual treatment of airway obstruction as needed for comfort.       Home/SNF/Other Patient from Home  Chief Complaint N/V  Level of Care/Admitting Diagnosis ED Disposition    ED Disposition Condition [place]: [place] [[id number]]  Level of Care: Med-Surg [16]  Diagnosis: Acute hypoxemic respiratory failure [facility]) [[id number]]  Admitting Physician: Odessa Fleming  Attending Physician: Odessa Fleming  Estimated length of stay: past midnight tomorrow  Certification:: I certify this patient will need inpatient services for at least 2 midnights  PT Class (Do Not Modify): Inpatient [101]  PT Acc Code (Do Not Modify): Private [1]       Medical History Past Medical History:  Diagnosis Date  . Cancer ([place]) 2019   colon  . Chronic kidney disease   . Diabetes mellitus without complication (HCC)    diet controlled  .  Hemodialysis access site with arteriovenous graft ([place])   . Hypertension   . Renal insufficiency    Pt on dialysis x 3 years and receives every Monday, Wednesday and Friday.     Allergies No Known Allergies  IV Location/Drains/Wounds Patient Lines/Drains/Airways Status   Active Line/Drains/Airways    Name:   Placement date:   Placement time:   Site:   Days:   Peripheral IV 06/16/18 Right Antecubital   06/16/18    1237    Antecubital   less than 1   Fistula / Graft Left Upper arm Arteriovenous fistula   -    -    Upper arm      Fistula / Graft Left Upper arm   -    -    Upper arm      Tunneled CVC Single Lumen (Radiology) 04/19/18 Right Subclavian   04/19/18    1723    Subclavian   58   Incision (Closed) 05/16/18 Abdomen Other (Comment)   05/16/18    1540     31   Incision - 2 Ports Abdomen Right;Upper Medial;Upper   05/16/18    1534     31          Labs/Imaging Results for orders placed or performed during the hospital encounter of 06/16/18 (from the past 48 hour(s))  Basic metabolic panel     Status: Abnormal   Collection Time: 06/16/18  1:48 PM  Result Value Ref Range   Sodium  143 135 - 145 mmol/L   Potassium 3.4 (L) 3.5 - 5.1 mmol/L   Chloride 101 98 - 111 mmol/L   CO2 29 22 - 32 mmol/L   Glucose, Bld 118 (H) 70 - 99 mg/dL   BUN 46 (H) 8 - 23 mg/dL   Creatinine, Ser 8.37 (H) 0.61 - 1.24 mg/dL   Calcium 8.1 (L) 8.9 - 10.3 mg/dL   GFR calc non Af Amer 6 (L) >60 mL/min   GFR calc Af Amer 6 (L) >60 mL/min   Anion gap 13 5 - 15    Comment: Performed at [facility], [address] [place], [place] [address]  Hepatic function panel     Status: Abnormal   Collection Time: 06/16/18  1:48 PM  Result Value Ref Range   Total Protein 6.1 (L) 6.5 - 8.1 g/dL   Albumin 3.3 (L) 3.5 - 5.0 g/dL   AST 26 15 - 41 U/L   ALT 16 0 - 44 U/L   Alkaline Phosphatase 67 38 - 126 U/L   Total Bilirubin 1.3 (H) 0.3 - 1.2 mg/dL   Bilirubin, Direct 0.2 0.0 - 0.2 mg/dL   Indirect  Bilirubin 1.1 (H) 0.3 - 0.9 mg/dL    Comment: Performed at [facility], [address] [place], [place] [address]  Lipase, blood     Status: None   Collection Time: 06/16/18  1:48 PM  Result Value Ref Range   Lipase 31 11 - 51 U/L    Comment: Performed at [facility], [address] [place], [place] [address]  Troponin I - Once     Status: Abnormal   Collection Time: 06/16/18  1:48 PM  Result Value Ref Range   Troponin I 0.15 (HH) <0.03 ng/mL    Comment: CRITICAL RESULT CALLED TO, READ BACK BY AND VERIFIED WITH JEANETTE PEREZ 06/16/18 @ [place] Performed at [facility], [address] [place], [place] [address]   CBC with Differential     Status: Abnormal   Collection Time: 06/16/18  1:48 PM  Result Value Ref Range   WBC 15.8 (H) 4.0 - 10.5 K/uL   RBC 2.46 (L) 4.22 - 5.81 MIL/uL   Hemoglobin 7.5 (L) 13.0 - 17.0 g/dL   HCT 23.0 (L) 39.0 - 52.0 %   MCV 93.5 80.0 - 100.0 fL   MCH 30.5 26.0 - 34.0 pg   MCHC 32.6 30.0 - 36.0 g/dL   RDW 18.3 (H) 11.5 - 15.5 %   Platelets 122 (L) 150 - 400 K/uL    Comment: Immature Platelet Fraction may be clinically indicated, consider ordering this additional test [id number]    nRBC 0.0 0.0 - 0.2 %   Neutrophils Relative % 48 %   Neutro Abs 7.5 1.7 - 7.7 K/uL   Lymphocytes Relative 45 %   Lymphs Abs 7.1 (H) 0.7 - 4.0 K/uL   Monocytes Relative 6 %   Monocytes Absolute 1.0 0.1 - 1.0 K/uL   Eosinophils Relative 0 %   Eosinophils Absolute 0.0 0.0 - 0.5 K/uL   Basophils Relative 0 %   Basophils Absolute 0.0 0.0 - 0.1 K/uL   Immature Granulocytes 1 %   Abs Immature Granulocytes 0.22 (H) 0.00 - 0.07 K/uL   Smudge Cells PRESENT     Comment: Performed at [facility], [address] [place], [place] [address]  Lactic acid, plasma     Status: None   Collection Time: 06/16/18  1:48 PM  Result Value Ref Range   Lactic Acid, Venous 1.3 0.5 - 1.9 mmol/L    Comment: Performed at [facility], [address] [place], [place] [address]  Brain natriuretic peptide     Status: Abnormal   Collection Time: 06/16/18  1:48 PM  Result Value Ref Range   B Natriuretic Peptide 2,154.0 (H) 0.0 - 100.0 pg/mL    Comment: Performed at [facility], [address] [place], [place] [address]  Blood gas, arterial     Status: Abnormal   Collection Time: 06/16/18  3:18 PM  Result Value Ref Range   FIO2 0.44    Delivery systems NASAL CANNULA    pH, Arterial 7.48 (H) 7.350 - 7.450   pCO2 arterial 43 32.0 - 48.0 mmHg   pO2, Arterial 60 (L) 83.0 - 108.0 mmHg   Bicarbonate 32.0 (H) 20.0 - 28.0 mmol/L   Acid-Base Excess 7.6 (H) 0.0 - 2.0 mmol/L   O2 Saturation 92.4 %   Patient temperature 37.0    Collection site RIGHT RADIAL    Sample type ARTERIAL DRAW    Allens test (pass/fail) PASS PASS    Comment: Performed at [facility], [address], [place], [place] [address]   Dg Chest Portable 1 View  Result Date: 06/16/2018 CLINICAL DATA:  Nausea vomiting and diarrhea. EXAM: PORTABLE CHEST 1 VIEW COMPARISON:  04/18/2018 FINDINGS: There is a right IJ catheter with tip projecting over the cavoatrial junction. The heart size is mildly enlarged. There is mild diffuse pulmonary edema. No airspace consolidation identified. IMPRESSION: 1. Mild cardiac enlargement and pulmonary edema. Electronically Signed   By: Kerby Moors M.D.   On: 06/16/2018 14:26    Pending Labs Unresulted Labs (From admission, onward)    Start     Ordered   06/16/18 1348  Pathologist smear review  Once,   STAT     06/16/18 1348   06/16/18 1337  Lactic acid, plasma  Now then every 2 hours,   STAT     06/16/18 1336   06/16/18 1337  Urinalysis, Complete w Microscopic  ONCE - STAT,   STAT     06/16/18 1336   Signed and Held  CBC  (heparin)  Once,   R    Comments:  Baseline for heparin therapy IF NOT ALREADY DRAWN.  Notify MD if PLT < 100 K.    Signed and Held   Signed and Held  Creatinine, serum  (heparin)  Once,    R    Comments:  Baseline for heparin therapy IF NOT ALREADY DRAWN.    Signed and Held          Vitals/Pain Today's Vitals   06/16/18 1340 06/16/18 1342  BP: (!) 155/75   Pulse: 99   Temp: 98.6 F (37 C)   TempSrc: Oral   SpO2: 98%   Weight:  95.3 kg  Height:  6\' 1"  (1.854 m)  PainSc:  0-No pain    Isolation Precautions No active isolations  Medications Medications  ampicillin-sulbactam (UNASYN) 1.5 g in sodium chloride 0.9 % 100 mL IVPB (1.5 g Intravenous Not Given 06/16/18 1612)  Ampicillin-Sulbactam (UNASYN) 3 g in sodium chloride 0.9 % 100 mL IVPB (has no administration in time range)  ondansetron (ZOFRAN) injection 4 mg (4 mg Intravenous Given 06/16/18 1409)  ampicillin-sulbactam (UNASYN) 1.5 g in sodium chloride 0.9 % 100 mL IVPB (0 g Intravenous Stopped 06/16/18 1611)    Mobility Ambulates

## 2018-06-16 NOTE — Progress Notes (Signed)
Family Meeting Note  Advance Directive:yes  Today a meeting took place with the pt's son and der in ER patient comes in today with nausea vomiting hypoxic respiratory failure. Has end-stage renal disease on hemodialysis recently diagnosed with bacterial infections/colitis. He is also diagnosed with invasive adenocarcinoma of the cecum. Patient does have elevated troponin mild appears demand ischemia will continue to monitor. Discuss code status with patient's son who was his healthcare power of attorney patient remains a DNR.  Time spent during discussion:16 mins Fritzi Mandes, MD

## 2018-06-16 NOTE — ED Notes (Signed)
ABX infusing. Patient awaiting admission.

## 2018-06-16 NOTE — ED Triage Notes (Signed)
C/o of N/V/D began yesterday. As per ems patient sating 60% on room air, arrives to ed on NRB sating 100%. Patient denies SOB/CP.

## 2018-06-16 NOTE — ED Notes (Signed)
Critical value from [place] in lab. Dr. Jodell Cipro aware.

## 2018-06-16 NOTE — ED Provider Notes (Signed)
[facility] Emergency Department Provider Note ____________________________________________   First MD Initiated Contact with Patient 06/16/18 1335     (approximate)  I have reviewed the triage vital signs and the nursing notes.   HISTORY  Chief Complaint Nausea and Emesis    HPI Jonathan Mcgrath is a 77 y.o. male with PMH as noted below who presents with nausea, vomiting, diarrhea since yesterday after he ate, not associated with abdominal pain or fever.  The patient states that he last vomited last night and today has been having diarrhea.  When EMS arrived, they found that he was hypoxic with O2 saturation in the 60s on room air and he was placed on a nonrebreather.  However, the patient does not endorse any shortness of breath or chest pain.  Past Medical History:  Diagnosis Date  . Cancer ([place]) 2019   colon  . Chronic kidney disease   . Diabetes mellitus without complication (HCC)    diet controlled  . Hemodialysis access site with arteriovenous graft ([place])   . Hypertension   . Renal insufficiency    Pt on dialysis x 3 years and receives every Monday, Wednesday and Friday.     Patient Active Problem List   Diagnosis Date Noted  . Acute hypoxemic respiratory failure ([place]) 06/16/2018  . Colon cancer ([place]) 05/16/2018  . Benign neoplasm of ascending colon   . Benign neoplasm of transverse colon   . Polyp of sigmoid colon   . Colon neoplasm   . Mass of cecum   . Diarrhea   . Stomach irritation   . Abdominal pain 04/12/2018  . CAP (community acquired pneumonia) 08/17/2016    Past Surgical History:  Procedure Laterality Date  . AV FISTULA PLACEMENT Left 2015   arm  . CATARACT EXTRACTION W/ INTRAOCULAR LENS  IMPLANT, BILATERAL Bilateral 08/2017  . CENTRAL LINE INSERTION-TUNNELED N/A 04/19/2018   Procedure: CENTRAL LINE INSERTION-TUNNELED;  Surgeon: Delana Meyer Dolores Lory, MD;  Location: [place] [facility];  Service: Cardiovascular;   Laterality: N/A;  . COLON RESECTION N/A 05/16/2018   Procedure: LAPAROSCOPIC  COLON RESECTION;  Surgeon: Herbert Pun, MD;  Location: [facility] ORS;  Service: General;  Laterality: N/A;  . COLONOSCOPY N/A 04/18/2018   Procedure: COLONOSCOPY;  Surgeon: Virgel Manifold, MD;  Location: [facility] ENDOSCOPY;  Service: Endoscopy;  Laterality: N/A;  . ESOPHAGOGASTRODUODENOSCOPY N/A 04/18/2018   Procedure: ESOPHAGOGASTRODUODENOSCOPY (EGD);  Surgeon: Virgel Manifold, MD;  Location: [facility] ENDOSCOPY;  Service: Endoscopy;  Laterality: N/A;  . EYE SURGERY    . PERIPHERAL VASCULAR CATHETERIZATION Left 01/12/2016   Procedure: A/V Shuntogram/Fistulagram;  Surgeon: Algernon Huxley, MD;  Location: [place] [facility];  Service: Cardiovascular;  Laterality: Left;  . PERIPHERAL VASCULAR CATHETERIZATION N/A 01/12/2016   Procedure: A/V Shunt Intervention;  Surgeon: Algernon Huxley, MD;  Location: [place] [facility];  Service: Cardiovascular;  Laterality: N/A;  . PERITONEAL CATHETER INSERTION N/A   . REMOVAL OF A DIALYSIS CATHETER N/A 01/14/2015   Procedure: REMOVAL OF A  PERITONEAL DIALYSIS CATHETER;  Surgeon: Algernon Huxley, MD;  Location: [facility] ORS;  Service: Vascular;  Laterality: N/A;  . TEE WITHOUT CARDIOVERSION N/A 04/19/2018   Procedure: TRANSESOPHAGEAL ECHOCARDIOGRAM (TEE);  Surgeon: Teodoro Spray, MD;  Location: [facility] ORS;  Service: Cardiovascular;  Laterality: N/A;    Prior to Admission medications   Medication Sig Start Date End Date Taking? Authorizing Provider  acetaminophen (TYLENOL) 325 MG tablet Take 2 tablets (650 mg total) by mouth every  8 (eight) hours as needed for mild pain. 05/20/18 06/19/18 Yes Sakai, Isami, DO  amLODipine (NORVASC) 5 MG tablet Take 1 tablet (5 mg total) by mouth daily. Patient taking differently: Take 10 mg by mouth daily.  04/21/18  Yes Dustin Flock, MD  Cholecalciferol (VITAMIN D PO) Take 2 capsules by mouth 3 (three) times a week. Given during dialysis   Yes [provider]  cinacalcet (SENSIPAR) 30 MG tablet Take 30 mg by mouth every morning.   Yes [provider]  ferric citrate (AURYXIA) 1 GM 210 MG(Fe) tablet Take 210 mg by mouth daily.    Yes [provider]  Menthol, Topical Analgesic, 16 % CREA Apply 1 application topically daily as needed (neuropathy related leg pain).   Yes [provider]  multivitamin (RENA-VIT) TABS tablet Take 1 tablet by mouth daily. 10/12/15  Yes [provider]  ondansetron (ZOFRAN) 4 MG tablet Take 4 mg by mouth 3 (three) times daily as needed for nausea/vomiting. 06/07/18  Yes [provider]    Allergies Patient has no known allergies.  Family History  Problem Relation Age of Onset  . Diabetes Mother   . Heart disease Father     Social History Social History   Tobacco Use  . Smoking status: Never Smoker  . Smokeless tobacco: Never Used  Substance Use Topics  . Alcohol use: No  . Drug use: No    Review of Systems  Constitutional: No fever. Eyes: No redness. ENT: No sore throat. Cardiovascular: Denies chest pain. Respiratory: Denies shortness of breath. Gastrointestinal: Positive for diarrhea.  Genitourinary: Negative for dysuria.  Musculoskeletal: Negative for back pain. Skin: Negative for rash. Neurological: Negative for headache.   ____________________________________________   PHYSICAL EXAM:  VITAL SIGNS: ED Triage Vitals  Enc Vitals Group     BP 06/16/18 1340 (!) 155/75     Pulse Rate 06/16/18 1340 99     Resp --      Temp 06/16/18 1340 98.6 F (37 C)     Temp Source 06/16/18 1340 Oral     SpO2 06/16/18 1340 98 %     Weight 06/16/18 1342 210 lb (95.3 kg)     Height 06/16/18 1342 6\' 1"  (1.854 m)     Head Circumference --      Peak Flow --      Pain Score 06/16/18 1342 0     Pain Loc --      Pain Edu? --      Excl. in [place]? --     Constitutional: Alert and oriented.  Somewhat weak appearing but in no acute distress. Eyes:  Conjunctivae are normal.  Head: Atraumatic. Nose: No congestion/rhinnorhea. Mouth/Throat: Mucous membranes are slightly dry.   Neck: Normal range of motion.  Cardiovascular: Normal rate, regular rhythm. Grossly normal heart sounds.  Good peripheral circulation. Respiratory: Somewhat increased respiratory effort.  No retractions.  Decreased breath sounds bilaterally.  No wheezing. Gastrointestinal: Soft and nontender. No distention.  Genitourinary: No flank tenderness. Musculoskeletal: Extremities warm and well perfused.  Neurologic:  Normal speech and language. No gross focal neurologic deficits are appreciated.  Skin:  Skin is warm and dry. No rash noted. Psychiatric: Speech and behavior are normal.  ____________________________________________   LABS (all labs ordered are listed, but only abnormal results are displayed)  Labs Reviewed  BASIC METABOLIC PANEL - Abnormal; Notable for the following components:      Result Value   Potassium 3.4 (*)    Glucose, Bld  118 (*)    BUN 46 (*)    Creatinine, Ser 8.37 (*)    Calcium 8.1 (*)    GFR calc non Af Amer 6 (*)    GFR calc Af Amer 6 (*)    All other components within normal limits  HEPATIC FUNCTION PANEL - Abnormal; Notable for the following components:   Total Protein 6.1 (*)    Albumin 3.3 (*)    Total Bilirubin 1.3 (*)    Indirect Bilirubin 1.1 (*)    All other components within normal limits  TROPONIN I - Abnormal; Notable for the following components:   Troponin I 0.15 (*)    All other components within normal limits  CBC WITH DIFFERENTIAL/PLATELET - Abnormal; Notable for the following components:   WBC 15.8 (*)    RBC 2.46 (*)    Hemoglobin 7.5 (*)    HCT 23.0 (*)    RDW 18.3 (*)    Platelets 122 (*)    Lymphs Abs 7.1 (*)    Abs Immature Granulocytes 0.22 (*)    All other components within normal limits  BLOOD GAS, ARTERIAL - Abnormal; Notable for the following components:   pH, Arterial 7.48 (*)    pO2, Arterial  60 (*)    Bicarbonate 32.0 (*)    Acid-Base Excess 7.6 (*)    All other components within normal limits  LIPASE, BLOOD  LACTIC ACID, PLASMA  LACTIC ACID, PLASMA  URINALYSIS, COMPLETE (UACMP) WITH MICROSCOPIC  PATHOLOGIST SMEAR REVIEW  BRAIN NATRIURETIC PEPTIDE   ____________________________________________  EKG  ED ECG REPORT I, Arta Silence, the attending physician, personally viewed and interpreted this ECG.  Date: 06/16/2018 EKG Time: 1347 Rate: 99 Rhythm: normal sinus rhythm QRS Axis: normal Intervals: normal ST/T Wave abnormalities: LVH with repolarization abnormality Narrative Interpretation: no evidence of acute ischemia  ____________________________________________  RADIOLOGY  CXR: Mild cardiomegaly and pulmonary edema  ____________________________________________   PROCEDURES  Procedure(s) performed: No  Procedures  Critical Care performed: Yes  CRITICAL CARE Performed by: Arta Silence   Total critical care time: 40 minutes  Critical care time was exclusive of separately billable procedures and treating other patients.  Critical care was necessary to treat or prevent imminent or life-threatening deterioration.  Critical care was time spent personally by me on the following activities: development of treatment plan with patient and/or surrogate as well as nursing, discussions with consultants, evaluation of patient's response to treatment, examination of patient, obtaining history from patient or surrogate, ordering and performing treatments and interventions, ordering and review of laboratory studies, ordering and review of radiographic studies, pulse oximetry and re-evaluation of patient's condition. ____________________________________________   INITIAL IMPRESSION / ASSESSMENT AND PLAN / ED COURSE  Pertinent labs & imaging results that were available during my care of the patient were reviewed by me and considered in my medical  decision making (see chart for details).  77 year old male with PMH as noted above including ESRD on dialysis (last dialysis was 2 days ago) and recent admission approximately 1 month ago for laparoscopic right hemicolectomy for colon cancer, presents with nausea, vomiting, diarrhea since yesterday but was found to be significantly hypoxic by EMS.  I reviewed the past medical records in epic; the patient was admitted in late October and discharged on 05/20/2018 after laparoscopic extended right hemicolectomy for ascending colon cancer.  He had no complications during recovery.  He was also being treated for aortitis.  On exam today, the patient is somewhat weak appearing but relatively comfortable.  He denies pain anywhere.  His abdomen is soft and nontender.  He has decreased breath sounds bilaterally and denies actually feeling short of breath.  His O2 saturation is in the high 90s on nonrebreather.  The patient's primary GI symptoms are most consistent with gastroenteritis, foodborne illness, or less likely colitis.  I have a low suspicion for clinically significant colitis or diverticulitis given the lack of pain or tenderness on exam.  However the hypoxia is concerning.  Differential includes aspiration or other pneumonia, fluid overload related to ESRD, CHF, COPD.   We will obtain chest x-ray, lab work-up, and reassess.  ----------------------------------------- 4:05 PM on 06/16/2018 -----------------------------------------  Chest x-ray showed mild cardiomegaly and pulmonary edema, not really sufficient to explain the patient's significant hypoxia.  The other labs reveal somewhat more elevated troponin than is typical for the patient, elevated WBC count, and chronic anemia.  On reassessment, I took the patient off of oxygen for a few minutes.  His O2 saturation stayed in the 90s briefly but then went down as low as the 60s.  I confirmed this on multiple fingers.  When I placed the patient  back on oxygen (on nasal cannula) it went back up to the 90s.  Based on further history from the patient's family member who is here now, it seems that he had significant vomiting earlier and an episode where it seemed like he was choking or aspirating.  I am concerned that he could be having aspiration, and it is likely not showing up yet on the x-ray.  I started Unasyn.  I discussed the results of the work-up with the patient and his family members.  They feel comfortable with admission.  I signed the patient out to the hospitalist Dr. Posey Pronto. ____________________________________________   FINAL CLINICAL IMPRESSION(S) / ED DIAGNOSES  Final diagnoses:  Acute respiratory failure with hypoxia ([place])  Nausea vomiting and diarrhea      NEW MEDICATIONS STARTED DURING THIS VISIT:  New Prescriptions   No medications on file     Note:  This document was prepared using Dragon voice recognition software and may include unintentional dictation errors.    Arta Silence, MD 06/16/18 [phone]

## 2018-06-16 NOTE — Progress Notes (Addendum)
Pharmacy Antibiotic Note  BENNY DEUTSCHMAN is a 77 y.o. male with ESRD on HD at home, admitted on 06/16/2018 with aspiration pneumonia.  Pharmacy has been consulted for Unasyn dosing.  Plan: Unasyn 1.5 gm IV X 1 given in ED on 12/1 @ 15:41. Will order additional Unasyn 1.5 gm IV X 1 to make total starting dose of 3 gm. Will order Unasyn 3 gm IV Q24H to start on 12/2 @ 16:00.  Height: 6\' 1"  (185.4 cm) Weight: 210 lb (95.3 kg) IBW/kg (Calculated) : 79.9  Temp (24hrs), Avg:98.6 F (37 C), Min:98.6 F (37 C), Max:98.6 F (37 C)  Recent Labs  Lab 06/16/18 1348  WBC 15.8*  CREATININE 8.37*  LATICACIDVEN 1.3    Estimated Creatinine Clearance: 8.4 mL/min (A) (by C-G formula based on SCr of 8.37 mg/dL (H)).    No Known Allergies  Antimicrobials this admission:   >>    >>   Dose adjustments this admission:   Microbiology results:  BCx:   UCx:    Sputum:    MRSA PCR:   Thank you for allowing pharmacy to be a part of this patient's care.  Kobe Ofallon D 06/16/2018 4:10 PM

## 2018-06-16 NOTE — H&P (Addendum)
[facility] [place] NAME: Jonathan Mcgrath    MR#:  [id number]  DATE OF BIRTH:  1940-12-07  DATE OF ADMISSION:  06/16/2018  PRIMARY CARE PHYSICIAN: [facility] [place]   REQUESTING/REFERRING PHYSICIAN: dr Cherylann Banas  CHIEF COMPLAINT:  nausea vomiting and found to be hypoxic with sats 60% at home by EMS  patient son and daughter in the room.  HISTORY OF PRESENT ILLNESS:  Jonathan Mcgrath  is a 77 y.o. male with a known history of end-stage renal disease on hemodialysis, diabetes not on any home medicines comes to the emergency room after he started having nausea and vomiting since yesterday feeling very weak and deconditioned. Patient felt weak today to the point he is knees gave out he went down to the floor without any injury. Son got him to the side of the bed and called EMS found sats 60% on room air. They put him on nasal cannula 6 L. Currently is 96% on 3 L. Workup in the ER showed white count of 15,000, chest x-ray without focal consolidation, hypoxia. Patient is being admitted for further evaluation and management of acute hypoxic respiratory failure with possible aspiration pneumonitis given vomiting for last two days.   HD on Monday Wednesday Friday  PAST MEDICAL HISTORY:   Past Medical History:  Diagnosis Date  . Cancer ([place]) 2019   colon  . Chronic kidney disease   . Diabetes mellitus without complication (HCC)    diet controlled  . Hemodialysis access site with arteriovenous graft ([facility])   . Hypertension   . Renal insufficiency    Pt on dialysis x 3 years and receives every Monday, Wednesday and Friday.     PAST SURGICAL HISTOIRY:   Past Surgical History:  Procedure Laterality Date  . AV FISTULA PLACEMENT Left 2015   arm  . CATARACT EXTRACTION W/ INTRAOCULAR LENS  IMPLANT, BILATERAL Bilateral 08/2017  . CENTRAL LINE INSERTION-TUNNELED N/A 04/19/2018   Procedure: CENTRAL LINE INSERTION-TUNNELED;   Surgeon: Delana Meyer Dolores Lory, MD;  Location: [facility];  Service: Cardiovascular;  Laterality: N/A;  . COLON RESECTION N/A 05/16/2018   Procedure: LAPAROSCOPIC  COLON RESECTION;  Surgeon: Herbert Pun, MD;  Location: [facility];  Service: General;  Laterality: N/A;  . COLONOSCOPY N/A 04/18/2018   Procedure: COLONOSCOPY;  Surgeon: Virgel Manifold, MD;  Location: [facility];  Service: Endoscopy;  Laterality: N/A;  . ESOPHAGOGASTRODUODENOSCOPY N/A 04/18/2018   Procedure: ESOPHAGOGASTRODUODENOSCOPY (EGD);  Surgeon: Virgel Manifold, MD;  Location: [facility];  Service: Endoscopy;  Laterality: N/A;  . EYE SURGERY    . PERIPHERAL VASCULAR CATHETERIZATION Left 01/12/2016   Procedure: A/V Shuntogram/Fistulagram;  Surgeon: Algernon Huxley, MD;  Location: [facility] [place] [facility];  Service: Cardiovascular;  Laterality: Left;  . PERIPHERAL VASCULAR CATHETERIZATION N/A 01/12/2016   Procedure: A/V Shunt Intervention;  Surgeon: Algernon Huxley, MD;  Location: [place] [facility];  Service: Cardiovascular;  Laterality: N/A;  . PERITONEAL CATHETER INSERTION N/A   . REMOVAL OF A DIALYSIS CATHETER N/A 01/14/2015   Procedure: REMOVAL OF A  PERITONEAL DIALYSIS CATHETER;  Surgeon: Algernon Huxley, MD;  Location: [facility];  Service: Vascular;  Laterality: N/A;  . TEE WITHOUT CARDIOVERSION N/A 04/19/2018   Procedure: TRANSESOPHAGEAL ECHOCARDIOGRAM (TEE);  Surgeon: Teodoro Spray, MD;  Location: [facility];  Service: Cardiovascular;  Laterality: N/A;    SOCIAL HISTORY:   Social History   Tobacco Use  . Smoking status: Never Smoker  .  Smokeless tobacco: Never Used  Substance Use Topics  . Alcohol use: No    FAMILY HISTORY:   Family History  Problem Relation Age of Onset  . Diabetes Mother   . Heart disease Father     DRUG ALLERGIES:  No Known Allergies  REVIEW OF SYSTEMS:  Review of Systems  Constitutional: Negative for chills, fever and weight loss.  HENT: Negative for ear discharge,  ear pain and nosebleeds.   Eyes: Negative for blurred vision, pain and discharge.  Respiratory: Negative for sputum production, shortness of breath, wheezing and stridor.   Cardiovascular: Negative for chest pain, palpitations, orthopnea and PND.  Gastrointestinal: Positive for nausea and vomiting. Negative for abdominal pain and diarrhea.  Genitourinary: Negative for frequency and urgency.  Musculoskeletal: Negative for back pain and joint pain.  Neurological: Positive for weakness. Negative for sensory change, speech change and focal weakness.  Psychiatric/Behavioral: Negative for depression and hallucinations. The patient is not nervous/anxious.      MEDICATIONS AT HOME:   Prior to Admission medications   Medication Sig Start Date End Date Taking? Authorizing Provider  acetaminophen (TYLENOL) 325 MG tablet Take 2 tablets (650 mg total) by mouth every 8 (eight) hours as needed for mild pain. 05/20/18 06/19/18 Yes Sakai, Isami, DO  amLODipine (NORVASC) 5 MG tablet Take 1 tablet (5 mg total) by mouth daily. Patient taking differently: Take 10 mg by mouth daily.  04/21/18  Yes Dustin Flock, MD  Cholecalciferol (VITAMIN D PO) Take 2 capsules by mouth 3 (three) times a week. Given during dialysis   Yes [provider]  cinacalcet (SENSIPAR) 30 MG tablet Take 30 mg by mouth every morning.   Yes [provider]  ferric citrate (AURYXIA) 1 GM 210 MG(Fe) tablet Take 210 mg by mouth daily.    Yes [provider]  Menthol, Topical Analgesic, 16 % CREA Apply 1 application topically daily as needed (neuropathy related leg pain).   Yes [provider]  multivitamin (RENA-VIT) TABS tablet Take 1 tablet by mouth daily. 10/12/15  Yes [provider]  ondansetron (ZOFRAN) 4 MG tablet Take 4 mg by mouth 3 (three) times daily as needed for nausea/vomiting. 06/07/18  Yes [provider]      VITAL SIGNS:  Blood pressure (!) 155/75, pulse 99, temperature  98.6 F (37 C), temperature source Oral, height 6\' 1"  (1.854 m), weight 95.3 kg, SpO2 98 %.  PHYSICAL EXAMINATION:  GENERAL:  77 y.o.-year-old patient lying in the bed with no acute distress. Pallor+ EYES: Pupils equal, round, reactive to light and accommodation. No scleral icterus. Extraocular muscles intact.  HEENT: Head atraumatic, normocephalic. Oropharynx and nasopharynx clear.  NECK:  Supple, no jugular venous distention. No thyroid enlargement, no tenderness.  LUNGS: Normal breath sounds bilaterally, no wheezing,few rales,no rhonchi or crepitation. No use of accessory muscles of respiration.  CARDIOVASCULAR: S1, S2 normal. No murmurs, rubs, or gallops.  ABDOMEN: Soft, nontender, nondistended. Bowel sounds present. No organomegaly or mass.  EXTREMITIES: No pedal edema, cyanosis, or clubbing.  NEUROLOGIC: Cranial nerves II through XII are intact. Muscle strength 5/5 in all extremities. Sensation intact. Gait not checked.  PSYCHIATRIC: The patient is alert and oriented x 3.  SKIN: No obvious rash, lesion, or ulcer.   LABORATORY PANEL:   CBC Recent Labs  Lab 06/16/18 1348  WBC 15.8*  HGB 7.5*  HCT 23.0*  PLT 122*   ------------------------------------------------------------------------------------------------------------------  Chemistries  Recent Labs  Lab 06/16/18 1348  NA 143  K 3.4*  CL 101  CO2 29  GLUCOSE 118*  BUN 46*  CREATININE 8.37*  CALCIUM 8.1*  AST 26  ALT 16  ALKPHOS 67  BILITOT 1.3*   ------------------------------------------------------------------------------------------------------------------  Cardiac Enzymes Recent Labs  Lab 06/16/18 1348  TROPONINI 0.15*   ------------------------------------------------------------------------------------------------------------------  RADIOLOGY:  Dg Chest Portable 1 View  Result Date: 06/16/2018 CLINICAL DATA:  Nausea vomiting and diarrhea. EXAM: PORTABLE CHEST 1 VIEW COMPARISON:  04/18/2018  FINDINGS: There is a right IJ catheter with tip projecting over the cavoatrial junction. The heart size is mildly enlarged. There is mild diffuse pulmonary edema. No airspace consolidation identified. IMPRESSION: 1. Mild cardiac enlargement and pulmonary edema. Electronically Signed   By: Kerby Moors M.D.   On: 06/16/2018 14:26    EKG:    IMPRESSION AND PLAN:   Jonathan Mcgrath  is a 77 y.o. male with a known history of end-stage renal disease on hemodialysis, diabetes not on any home medicines comes to the emergency room after he started having nausea and vomiting since yesterday feeling very weak and deconditioned. Patient felt weak today to the point he is knees gave out he went down to the floor without any injury. Son got him to the side of the bed and called EMS found sats 60% on room air. They put him on nasal cannula 6 L.  1. acute hypoxic respiratory failure suspected due to possible aspiration pneumonia and possible congestive heart failure acute diastolic in the setting of vomiting for two days -IV Unasyn 3 q--- pharmacy to dose -wean oxygen as tolerated. -Incentive spirometer, nebs as needed -patient currently is asymptomatic -if patient starts getting symptomatic shortness of breath consider CT chest to rule out PE -BNP elevated at 2100 -patient will benefit from ultrafiltration at dialysis tomorrow. Discussed with Dr. Candiss Norse -echo of the heart -cycle cardiac enzymes times three. Patient was seen by Dr. Ubaldo Glassing during the last admission. -Consider cardiology consultation if troponin continues to rise. At -aspirin 81 mg daily.  2. end-stage renal disease on hemodialysis -nephrology consultation with Dr. Candiss Norse -x-ray shows some pulmonary vascular congestion. Consider ultrafiltration tomorrow dialysis  3. History of invasive adenocarcinoma of the cecum. Patient follows with Dr. Janese Banks  4. Chronic anemia  -hemoglobin 7.5. No active bleeding. Continue to monitor.  5. DVT  prophylaxis subcu heparin  all the records are reviewed and case discussed with ED provider. Management plans discussed with the patient, family and they are in agreement.  CODE STATUS: DNR  TOTAL TIME TAKING CARE OF THIS PATIENT: *55* minutes.    Fritzi Mandes M.D on 06/16/2018 at 4:18 PM  Between 7am to 6pm - Pager - [phone]  After 6pm go to [email or web address] - password [email or web address] [facility]  [facility]  Office  [phone]  CC: Primary care physician; [employer]

## 2018-06-16 NOTE — ED Notes (Signed)
Patient reports positive decrease in nausea. Awaiting bed status,

## 2018-06-17 ENCOUNTER — Inpatient Hospital Stay
Admit: 2018-06-17 | Discharge: 2018-06-17 | Disposition: A | Discharge status: Home / Self Care | Payer: Medicare HMO | Attending: Internal Medicine | Admitting: Internal Medicine

## 2018-06-17 LAB — PHOSPHORUS: Phosphorus: 5.8 mg/dL — ABNORMAL HIGH (ref 2.5–4.6)

## 2018-06-17 LAB — PATHOLOGIST SMEAR REVIEW

## 2018-06-17 LAB — ECHOCARDIOGRAM COMPLETE
Height: 73 in
Weight: 3206.4 [oz_av]

## 2018-06-17 LAB — TROPONIN I
Troponin I: 0.66 ng/mL (ref <0.03)
Troponin I: 0.4 ng/mL (ref <0.03)

## 2018-06-17 LAB — PROCALCITONIN: Procalcitonin: 9.19 ng/mL

## 2018-06-17 NOTE — Progress Notes (Signed)
HD Treatment Complete    06/17/18 1640  Vital Signs  Pulse Rate Source Monitor  During Hemodialysis Assessment  Blood Flow Rate (mL/min) 400 mL/min  Arterial Pressure (mmHg) -130 mmHg  Venous Pressure (mmHg) 250 mmHg  Transmembrane Pressure (mmHg) 70 mmHg  Ultrafiltration Rate (mL/min) 580 mL/min  Dialysate Flow Rate (mL/min) 600 ml/min  Conductivity: Machine  13.7  HD Safety Checks Performed Yes  Intra-Hemodialysis Comments Progressing as prescribed;Tolerated well;Tx completed  Fistula / Graft Left Upper arm Arteriovenous fistula  No Placement Date or Time found.   Placed prior to admission: Yes  Orientation: Left  Access Location: Upper arm  Access Type: Arteriovenous fistula  Status Deaccessed

## 2018-06-17 NOTE — Progress Notes (Signed)
[place] [facility] [place] NAME: Tremell Reimers    MR#:  [id number]  DATE OF BIRTH:  [birth year]  SUBJECTIVE:  CHIEF COMPLAINT:   Chief Complaint  Patient presents with  . Nausea  . Emesis   SOB still present. Cough. No chest pain Afebrile On 5 L O2  REVIEW OF SYSTEMS:    Review of Systems  Constitutional: Positive for malaise/fatigue. Negative for chills and fever.  HENT: Negative for sore throat.   Eyes: Negative for blurred vision, double vision and pain.  Respiratory: Positive for cough and shortness of breath. Negative for hemoptysis and wheezing.   Cardiovascular: Negative for chest pain, palpitations, orthopnea and leg swelling.  Gastrointestinal: Negative for abdominal pain, constipation, diarrhea, heartburn, nausea and vomiting.  Genitourinary: Negative for dysuria and hematuria.  Musculoskeletal: Negative for back pain and joint pain.  Skin: Negative for rash.  Neurological: Negative for sensory change, speech change, focal weakness and headaches.  Endo/Heme/Allergies: Does not bruise/bleed easily.  Psychiatric/Behavioral: Negative for depression. The patient is not nervous/anxious.     DRUG ALLERGIES:  No Known Allergies  VITALS:  Blood pressure 135/61, pulse 79, temperature 98.8 F (37.1 C), temperature source Oral, resp. rate 16, height 6\' 1"  (1.854 m), weight 90.9 kg, SpO2 92 %.  PHYSICAL EXAMINATION:   Physical Exam  GENERAL:  77 y.o.-year-old patient lying in the bed with no acute distress.  EYES: Pupils equal, round, reactive to light and accommodation. No scleral icterus. Extraocular muscles intact.  HEENT: Head atraumatic, normocephalic. Oropharynx and nasopharynx clear.  NECK:  Supple, no jugular venous distention. No thyroid enlargement, no tenderness.  LUNGS: Bilateral crackles.  CARDIOVASCULAR: S1, S2 normal. No murmurs, rubs, or gallops.  ABDOMEN: Soft, nontender, nondistended. Bowel sounds present. No  organomegaly or mass.  EXTREMITIES: No cyanosis, clubbing or edema b/l.    NEUROLOGIC: Cranial nerves II through XII are intact. No focal Motor or sensory deficits b/l.   PSYCHIATRIC: The patient is alert and oriented x 3.  SKIN: No obvious rash, lesion, or ulcer.   LABORATORY PANEL:   CBC Recent Labs  Lab 06/16/18 1348  WBC 15.8*  HGB 7.5*  HCT 23.0*  PLT 122*   ------------------------------------------------------------------------------------------------------------------ Chemistries  Recent Labs  Lab 06/16/18 1348  NA 143  K 3.4*  CL 101  CO2 29  GLUCOSE 118*  BUN 46*  CREATININE 8.37*  CALCIUM 8.1*  AST 26  ALT 16  ALKPHOS 67  BILITOT 1.3*   ------------------------------------------------------------------------------------------------------------------  Cardiac Enzymes Recent Labs  Lab 06/17/18 0557  TROPONINI 0.66*   ------------------------------------------------------------------------------------------------------------------  RADIOLOGY:  Dg Chest Portable 1 View  Result Date: 06/16/2018 CLINICAL DATA:  Nausea vomiting and diarrhea. EXAM: PORTABLE CHEST 1 VIEW COMPARISON:  04/18/2018 FINDINGS: There is a right IJ catheter with tip projecting over the cavoatrial junction. The heart size is mildly enlarged. There is mild diffuse pulmonary edema. No airspace consolidation identified. IMPRESSION: 1. Mild cardiac enlargement and pulmonary edema. Electronically Signed   By: Kerby Moors M.D.   On: 06/16/2018 14:26     ASSESSMENT AND PLAN:   Deavin Forst  is a 77 y.o. male with a known history of end-stage renal disease on hemodialysis, diabetes not on any home medicines comes to the emergency room after he started having nausea and vomiting since yesterday feeling very weak and deconditioned. Patient felt weak today to the point he is knees gave out he went down to the floor without any injury. Son got him to  the side of the bed and called EMS found  sats 60% on room air. They put him on nasal cannula 6 L.  *Acute hypoxic respiratory failure secondary to combination of pulmonary edema and aspiration pneumonia Wean oxygen as tolerated.  *Aspiration pneumonia.  Patient had significant vomiting after which he had desaturations.  Started on Unasyn in the emergency room.  I have checked a procalcitonin which is elevated.  We will continue antibiotics.  *Elevated troponin.  Repeat troponin trending up.  I believe this is likely demand ischemia in setting of end-stage renal disease.  Echocardiogram has been repeated for any wall motion abnormalities.  I have also discussed the case with Dr. Nehemiah Massed.  Third troponin pending.  We will continue telemetry monitoring.  Aspirin.  *End stage renal disease on hemodialysis.  With fluid overload on chest x-ray patient has been scheduled for dialysis later today.  * History of invasive adenocarcinoma of the cecum. Patient follows with Dr. Janese Banks at the cancer center  * Chronic anemia  Stable  * DVT prophylaxis subcu heparin  All the records are reviewed and case discussed with Care Management/Social Worker Management plans discussed with the patient, family and they are in agreement.  CODE STATUS: DNR  DVT Prophylaxis: SCDs  TOTAL TIME TAKING CARE OF THIS PATIENT: 35 minutes.   POSSIBLE D/C IN 2-3 DAYS, DEPENDING ON CLINICAL CONDITION.  Leia Alf Eleshia Wooley M.D on 06/17/2018 at 12:10 PM  Between 7am to 6pm - Pager - [phone]  After 6pm go to [email or web address] - password [email or web address] [place] [facility]  Office  [phone]  CC: Primary care physician; [employer]  Note: This dictation was prepared with Diplomatic Services operational officer dictation along with smaller Company secretary. Any transcriptional errors that result from this process are unintentional.

## 2018-06-17 NOTE — Progress Notes (Signed)
Pre HD Treatment    06/17/18 1241  Vital Signs  Temp 98.4 F (36.9 C)  Temp Source Oral  Pulse Rate 80  Pulse Rate Source Monitor  Resp 19  BP (!) 154/67  BP Location Right Arm  BP Method Automatic  Patient Position (if appropriate) Lying  Oxygen Therapy  SpO2 93 %  O2 Device Nasal Cannula  O2 Flow Rate (L/min) 4 L/min  Pain Assessment  Pain Scale 0-10  Pain Score 0  Dialysis Weight  Weight 92.5 kg  Type of Weight Pre-Dialysis  Time-Out for Hemodialysis  What Procedure? HD  Pt Identifiers(min of two) First/Last Name;MRN/Account#;Pt's DOB(use if MRN/Acct# not available  Correct Site? Yes  Correct Side? Yes  Correct Procedure? Yes  Consents Verified? Yes  Rad Studies Available? N/A  Safety Precautions Reviewed? Yes  Engineer, civil (consulting) Number 3  Station Number 3  UF/Alarm Test Passed  Conductivity: Meter 14  Conductivity: Machine  13.8  pH 7.4  Reverse Osmosis Main  Normal Saline Lot Number [id number]  Dialyzer Lot Number [id number]  Disposable Set Lot Number [id number]  Machine Temperature 98.6 F (37 C)  Musician and Audible Yes  Blood Lines Intact and Secured Yes  Pre Treatment Patient Checks  Vascular access used during treatment Fistula  Hepatitis B Surface Antigen Results Negative  Date Hepatitis B Surface Antigen Drawn 09/05/17  Hepatitis B Surface Antibody  (>10)  Date Hepatitis B Surface Antibody Drawn 09/05/17  Hemodialysis Consent Verified Yes  Hemodialysis Standing Orders Initiated Yes  ECG (Telemetry) Monitor On Yes  Prime Ordered Normal Saline  Length of  DialysisTreatment -hour(s) 3.5 Hour(s)  Dialysis Treatment Comments Na 140  Dialyzer Elisio 17H NR  Dialysate 3K, 2.5 Ca  Variable Sodium Other (Comment)  Dialysis Anticoagulant None  Dialysate Flow Ordered 600  Blood Flow Rate Ordered 400 mL/min  Ultrafiltration Goal 1.5 Liters  Dialysis Blood Pressure Support Ordered Normal Saline  Education / Care Plan  Dialysis Education  Provided Yes  Documented Education in Care Plan Yes  Fistula / Graft Left Upper arm Arteriovenous fistula  No Placement Date or Time found.   Placed prior to admission: Yes  Orientation: Left  Access Location: Upper arm  Access Type: Arteriovenous fistula  Site Condition No complications  Fistula / Graft Assessment Present;Thrill;Bruit  Drainage Description None

## 2018-06-17 NOTE — Progress Notes (Signed)
[facility]  ROUNDING NOTE   Subjective:  Patient came in originally with nausea, vomiting, and cough.   found to have aspiration pneumonia. Patient due for hemodialysis today.    Objective:  Vital signs in last 24 hours:  Temp:  [98.2 F (36.8 C)-98.8 F (37.1 C)] 98.8 F (37.1 C) (12/02 0407) Pulse Rate:  [79-99] 79 (12/02 0407) Resp:  [16] 16 (12/01 1727) BP: (135-155)/(61-75) 135/61 (12/02 0407) SpO2:  [90 %-98 %] 92 % (12/02 0407) Weight:  [90.9 kg-95.3 kg] 90.9 kg (12/01 1727)  Weight change:  Filed Weights   06/16/18 1342 06/16/18 1727  Weight: 95.3 kg 90.9 kg    Intake/Output: I/O last 3 completed shifts: In: 450 [P.O.:150; IV Piggyback:300] Out: -    Intake/Output this shift:  Total I/O In: 54 [P.O.:590] Out: -   Physical Exam: General: No acute distress  Head: Normocephalic, atraumatic. Moist oral mucosal membranes  Eyes: Anicteric  Neck: Supple, trachea midline  Lungs:  Basilar rales  Heart: S1S2 no rubs  Abdomen:  Soft, nontender, bowel sounds present  Extremities: trace peripheral edema.  Neurologic: Awake, alert, following commands  Skin: No lesions  Access: LUE AV access    Basic Metabolic Panel: Recent Labs  Lab 06/16/18 1348 06/17/18 0557  NA 143  --   K 3.4*  --   CL 101  --   CO2 29  --   GLUCOSE 118*  --   BUN 46*  --   CREATININE 8.37*  --   CALCIUM 8.1*  --   PHOS  --  5.8*    Liver Function Tests: Recent Labs  Lab 06/16/18 1348  AST 26  ALT 16  ALKPHOS 67  BILITOT 1.3*  PROT 6.1*  ALBUMIN 3.3*   Recent Labs  Lab 06/16/18 1348  LIPASE 31   No results for input(s): AMMONIA in the last 168 hours.  CBC: Recent Labs  Lab 06/16/18 1348  WBC 15.8*  NEUTROABS 7.5  HGB 7.5*  HCT 23.0*  MCV 93.5  PLT 122*    Cardiac Enzymes: Recent Labs  Lab 06/16/18 1348 06/17/18 0557  TROPONINI 0.15* 0.66*    BNP: Invalid input(s): POCBNP  CBG: No results for input(s): GLUCAP in the last 168  hours.  Microbiology: Results for orders placed or performed during the hospital encounter of 05/16/18  MRSA PCR Screening     Status: None   Collection Time: 05/16/18  6:56 PM  Result Value Ref Range Status   MRSA by PCR NEGATIVE NEGATIVE Final    Comment:        The GeneXpert MRSA Assay (FDA approved for NASAL specimens only), is one component of a comprehensive MRSA colonization surveillance program. It is not intended to diagnose MRSA infection nor to guide or monitor treatment for MRSA infections. Performed at [facility], [address] [place], [place] [address]   CULTURE, BLOOD (ROUTINE X 2) w Reflex to ID Panel     Status: None   Collection Time: 05/17/18 11:12 PM  Result Value Ref Range Status   Specimen Description BLOOD RIGHT ANTECUBITAL  Final   Special Requests   Final    BOTTLES DRAWN AEROBIC AND ANAEROBIC Blood Culture adequate volume   Culture   Final    NO GROWTH 5 DAYS Performed at [facility], [address] [place], [place] [address]    Report Status 05/22/2018 FINAL  Final  CULTURE, BLOOD (ROUTINE X 2) w Reflex to ID Panel  Status: None   Collection Time: 05/17/18 11:19 PM  Result Value Ref Range Status   Specimen Description BLOOD RIGHT HAND  Final   Special Requests   Final    BOTTLES DRAWN AEROBIC AND ANAEROBIC Blood Culture results may not be optimal due to an excessive volume of blood received in culture bottles   Culture   Final    NO GROWTH 5 DAYS Performed at [facility], [address], [place], [place] [address]    Report Status 05/22/2018 FINAL  Final    Coagulation Studies: No results for input(s): LABPROT, INR in the last 72 hours.  Urinalysis: No results for input(s): COLORURINE, LABSPEC, PHURINE, GLUCOSEU, HGBUR, BILIRUBINUR, KETONESUR, PROTEINUR, UROBILINOGEN, NITRITE, LEUKOCYTESUR in the last 72 hours.  Invalid input(s): APPERANCEUR    Imaging: Dg Chest Portable 1 View  Result Date:  06/16/2018 CLINICAL DATA:  Nausea vomiting and diarrhea. EXAM: PORTABLE CHEST 1 VIEW COMPARISON:  04/18/2018 FINDINGS: There is a right IJ catheter with tip projecting over the cavoatrial junction. The heart size is mildly enlarged. There is mild diffuse pulmonary edema. No airspace consolidation identified. IMPRESSION: 1. Mild cardiac enlargement and pulmonary edema. Electronically Signed   By: Kerby Moors M.D.   On: 06/16/2018 14:26     Medications:   . ampicillin-sulbactam (UNASYN) IV     . amLODipine  10 mg Oral Daily  . aspirin EC  81 mg Oral Daily  . Chlorhexidine Gluconate Cloth  6 each Topical Q0600  . cholecalciferol  1,000 Units Oral Once per day on Mon Wed Fri  . cinacalcet  30 mg Oral Q breakfast  . epoetin (EPOGEN/PROCRIT) injection  4,000 Units Intravenous Q M,W,F-HD  . ferric citrate  210 mg Oral Daily  . heparin  5,000 Units Subcutaneous Q8H  . multivitamin  1 tablet Oral Daily   acetaminophen, albuterol, ondansetron **OR** ondansetron (ZOFRAN) IV  Assessment/ Plan:  77 y.o. male with end stage renal disease on hemodialysis,  hypertension, diabetes mellitus, GERD, anemia of CKD, SHPTH, colon cancer status post resection on 10/31 by Dr. [name], CLL followed by Dr. Janese Banks.   [facility] MWF L AVF 92kg  1.  ESRD on HD and aVF.  Patient due for hemodialysis today.  Orders have been prepared.  2.  Anemia chronic kidney disease.  Discussed with hematology as an outpatient.  Okay to reinitiate Epogen.  We have started the patient on 4000 units IV with dialysis treatments.  Hemoglobin currently 7.5.  3.  Secondary hyperparathyroidism.  Continue .  Citrate as well as cinacalcet30 mg with breakfast.  4.  Aspiration pneumonia.  Patient being treated with Unasyn at the moment.   LOS: 1 Anyla Israelson 12/2/201912:43 PM

## 2018-06-17 NOTE — Progress Notes (Signed)
PT Cancellation Note  Patient Details Name: Jonathan Mcgrath MRN: [id number] DOB: [birth year]   Cancelled Treatment:    Reason Eval/Treat Not Completed: Patient at procedure or test/unavailable.  Per MD note, elevated troponin likely demand ischemia (in setting of end-stage renal disease).  Pt currently off floor at dialysis (not available for PT session).  Will re-attempt PT evaluation at a later date/time as medically appropriate.  Leitha Bleak, PT 06/17/18, 1:23 PM [phone]

## 2018-06-17 NOTE — Progress Notes (Signed)
Post HD Treatment  Pt tolerated treatment well. His Net UF was 1504 and his BVP was 81. All blood was returned to patient. Report called to primary RN.    06/17/18 1645  Hand-Off documentation  Report given to (Full Name) Claudius Sis, RN  Report received from (Full Name) Stephannie Peters, RN  Vital Signs  Temp 98.8 F (37.1 C)  Temp Source Oral  Pulse Rate 81  Pulse Rate Source Monitor  Resp 19  BP (!) 153/64  BP Location Right Arm  BP Method Automatic  Patient Position (if appropriate) Lying  Oxygen Therapy  SpO2 95 %  O2 Device Room Air  Pain Assessment  Pain Scale 0-10  Pain Score 0  Dialysis Weight  Weight 88.2 kg  Type of Weight Post-Dialysis  Post-Hemodialysis Assessment  Rinseback Volume (mL) 250 mL  KECN 287 V  Dialyzer Clearance Clear  Duration of HD Treatment -hour(s) 3.5 hour(s)  Hemodialysis Intake (mL) 500 mL  UF Total -Machine (mL) 2004 mL  Net UF (mL) 1504 mL  Tolerated HD Treatment Yes  Post-Hemodialysis Comments Pt tolerated treatment well  AVG/AVF Arterial Site Held (minutes) 10 minutes  AVG/AVF Venous Site Held (minutes) 10 minutes  Fistula / Graft Left Upper arm Arteriovenous fistula  No Placement Date or Time found.   Placed prior to admission: Yes  Orientation: Left  Access Location: Upper arm  Access Type: Arteriovenous fistula  Site Condition No complications  Fistula / Graft Assessment Present;Thrill;Bruit  Drainage Description None

## 2018-06-17 NOTE — Consult Note (Signed)
[facility] Consultation Note  Patient ID: Jonathan Mcgrath, MRN: [id number], DOB/AGE: [birth year] 77 y.o. Admit date: 06/16/2018   Date of Consult: 06/17/2018 Primary Physician: Inc, [employer] Primary Cardiologist: None  Chief Complaint:  Chief Complaint  Patient presents with  . Nausea  . Emesis   Reason for Consult: Shortness of breath elevated troponin  HPI: 77 y.o. male with known diabetes with complication essential hypertension mixed hyperlipidemia and chronic kidney disease stage status post dialysis having acute respiratory failure and aspiration pneumonia with hypoxia and congestive heart failure.  Echocardiogram has shown normal LV systolic function with ejection fraction of 55% with minimal valvular heart disease but currently having a significant diastolic dysfunction heart failure secondary to above.  Troponin elevation of 0.66 and BNP of 2154.  The patient has had a significant improvements after dialysis without any chest pain or other significant symptoms at this time.  The patient feels much better today and is hemodynamically stable  Past Medical History:  Diagnosis Date  . Cancer ([place]) 2019   colon  . Chronic kidney disease   . Diabetes mellitus without complication (HCC)    diet controlled  . Hemodialysis access site with arteriovenous graft ([place])   . Hypertension   . Renal insufficiency    Pt on dialysis x 3 years and receives every Monday, Wednesday and Friday.       Surgical History:  Past Surgical History:  Procedure Laterality Date  . AV FISTULA PLACEMENT Left 2015   arm  . CATARACT EXTRACTION W/ INTRAOCULAR LENS  IMPLANT, BILATERAL Bilateral 08/2017  . CENTRAL LINE INSERTION-TUNNELED N/A 04/19/2018   Procedure: CENTRAL LINE INSERTION-TUNNELED;  Surgeon: Delana Meyer Dolores Lory, MD;  Location: [place] [facility];  Service: Cardiovascular;  Laterality: N/A;  . COLON RESECTION N/A 05/16/2018   Procedure: LAPAROSCOPIC  COLON  RESECTION;  Surgeon: Herbert Pun, MD;  Location: [facility] ORS;  Service: General;  Laterality: N/A;  . COLONOSCOPY N/A 04/18/2018   Procedure: COLONOSCOPY;  Surgeon: Virgel Manifold, MD;  Location: [facility] ENDOSCOPY;  Service: Endoscopy;  Laterality: N/A;  . ESOPHAGOGASTRODUODENOSCOPY N/A 04/18/2018   Procedure: ESOPHAGOGASTRODUODENOSCOPY (EGD);  Surgeon: Virgel Manifold, MD;  Location: [facility]COPY;  Service: Endoscopy;  Laterality: N/A;  . EYE SURGERY    . PERIPHERAL VASCULAR CATHETERIZATION Left 01/12/2016   Procedure: A/V Shuntogram/Fistulagram;  Surgeon: Algernon Huxley, MD;  Location: [place] [facility];  Service: Cardiovascular;  Laterality: Left;  . PERIPHERAL VASCULAR CATHETERIZATION N/A 01/12/2016   Procedure: A/V Shunt Intervention;  Surgeon: Algernon Huxley, MD;  Location: [place] [facility];  Service: Cardiovascular;  Laterality: N/A;  . PERITONEAL CATHETER INSERTION N/A   . REMOVAL OF A DIALYSIS CATHETER N/A 01/14/2015   Procedure: REMOVAL OF A  PERITONEAL DIALYSIS CATHETER;  Surgeon: Algernon Huxley, MD;  Location: [facility] ORS;  Service: Vascular;  Laterality: N/A;  . TEE WITHOUT CARDIOVERSION N/A 04/19/2018   Procedure: TRANSESOPHAGEAL ECHOCARDIOGRAM (TEE);  Surgeon: Teodoro Spray, MD;  Location: [facility] ORS;  Service: Cardiovascular;  Laterality: N/A;     Home Meds: Prior to Admission medications   Medication Sig Start Date End Date Taking? Authorizing Provider  acetaminophen (TYLENOL) 325 MG tablet Take 2 tablets (650 mg total) by mouth every 8 (eight) hours as needed for mild pain. 05/20/18 06/19/18 Yes Sakai, Isami, DO  amLODipine (NORVASC) 5 MG tablet Take 1 tablet (5 mg total) by mouth daily. Patient taking differently: Take 10 mg by mouth daily.  04/21/18  Yes  Dustin Flock, MD  Cholecalciferol (VITAMIN D PO) Take 2 capsules by mouth 3 (three) times a week. Given during dialysis   Yes [provider]  cinacalcet (SENSIPAR) 30 MG tablet Take 30 mg by mouth every  morning.   Yes [provider]  ferric citrate (AURYXIA) 1 GM 210 MG(Fe) tablet Take 210 mg by mouth daily.    Yes [provider]  Menthol, Topical Analgesic, 16 % CREA Apply 1 application topically daily as needed (neuropathy related leg pain).   Yes [provider]  multivitamin (RENA-VIT) TABS tablet Take 1 tablet by mouth daily. 10/12/15  Yes [provider]  ondansetron (ZOFRAN) 4 MG tablet Take 4 mg by mouth 3 (three) times daily as needed for nausea/vomiting. 06/07/18  Yes [provider]    Inpatient Medications:  . amLODipine  10 mg Oral Daily  . aspirin EC  81 mg Oral Daily  . Chlorhexidine Gluconate Cloth  6 each Topical Q0600  . cholecalciferol  1,000 Units Oral Once per day on Mon Wed Fri  . cinacalcet  30 mg Oral Q breakfast  . epoetin (EPOGEN/PROCRIT) injection  4,000 Units Intravenous Q M,W,F-HD  . ferric citrate  210 mg Oral Daily  . heparin  5,000 Units Subcutaneous Q8H  . multivitamin  1 tablet Oral Daily   . ampicillin-sulbactam (UNASYN) IV      Allergies: No Known Allergies  Social History   Socioeconomic History  . Marital status: Married    Spouse name: Not on file  . Number of children: Not on file  . Years of education: Not on file  . Highest education level: Not on file  Occupational History  . Occupation: retired  Scientific laboratory technician  . Financial resource strain: Not on file  . Food insecurity:    Worry: Not on file    Inability: Not on file  . Transportation needs:    Medical: Not on file    Non-medical: Not on file  Tobacco Use  . Smoking status: Never Smoker  . Smokeless tobacco: Never Used  Substance and Sexual Activity  . Alcohol use: No  . Drug use: No  . Sexual activity: Not on file  Lifestyle  . Physical activity:    Days per week: Not on file    Minutes per session: Not on file  . Stress: Not on file  Relationships  . Social connections:    Talks on phone: Not on file    Gets together:  Not on file    Attends religious service: Not on file    Active member of club or organization: Not on file    Attends meetings of clubs or organizations: Not on file    Relationship status: Not on file  . Intimate partner violence:    Fear of current or ex partner: Not on file    Emotionally abused: Not on file    Physically abused: Not on file    Forced sexual activity: Not on file  Other Topics Concern  . Not on file  Social History Narrative  . Not on file     Family History  Problem Relation Age of Onset  . Diabetes Mother   . Heart disease Father      Review of Systems Positive for cough congestion shortness of breath Negative for: General:  chills, fever, night sweats or weight changes.  Cardiovascular: PND orthopnea syncope dizziness  Dermatological skin lesions rashes Respiratory: Positive for cough congestion Urologic: Frequent urination urination at  night and hematuria Abdominal: negative for nausea, vomiting, diarrhea, bright red blood per rectum, melena, or hematemesis Neurologic: negative for visual changes, and/or hearing changes  All other systems reviewed and are otherwise negative except as noted above.  Labs: Recent Labs    06/16/18 1348 06/17/18 0557  TROPONINI 0.15* 0.66*   Lab Results  Component Value Date   WBC 15.8 (H) 06/16/2018   HGB 7.5 (L) 06/16/2018   HCT 23.0 (L) 06/16/2018   MCV 93.5 06/16/2018   PLT 122 (L) 06/16/2018    Recent Labs  Lab 06/16/18 1348  NA 143  K 3.4*  CL 101  CO2 29  BUN 46*  CREATININE 8.37*  CALCIUM 8.1*  PROT 6.1*  BILITOT 1.3*  ALKPHOS 67  ALT 16  AST 26  GLUCOSE 118*   Lab Results  Component Value Date   CHOL 198 06/04/2013   HDL 44 06/04/2013   LDLCALC 137 (H) 06/04/2013   TRIG 87 06/04/2013   No results found for: DDIMER  Radiology/Studies:  Dg Chest Portable 1 View  Result Date: 06/16/2018 CLINICAL DATA:  Nausea vomiting and diarrhea. EXAM: PORTABLE CHEST 1 VIEW COMPARISON:   04/18/2018 FINDINGS: There is a right IJ catheter with tip projecting over the cavoatrial junction. The heart size is mildly enlarged. There is mild diffuse pulmonary edema. No airspace consolidation identified. IMPRESSION: 1. Mild cardiac enlargement and pulmonary edema. Electronically Signed   By: Kerby Moors M.D.   On: 06/16/2018 14:26    EKG: Normal sinus rhythm  Weights: Filed Weights   06/16/18 1342 06/16/18 1727 06/17/18 1241  Weight: 95.3 kg 90.9 kg 92.5 kg     Physical Exam: Blood pressure (!) 157/61, pulse 74, temperature 98.4 F (36.9 C), temperature source Oral, resp. rate (!) 23, height 6\' 1"  (1.854 m), weight 92.5 kg, SpO2 97 %. Body mass index is 26.9 kg/m. General: Well developed, well nourished, in no acute distress. Head eyes ears nose throat: Normocephalic, atraumatic, sclera non-icteric, no xanthomas, nares are without discharge. No apparent thyromegaly and/or mass  Lungs: Normal respiratory effort.  Some wheezes, few rales, some rhonchi.  Heart: RRR with normal S1 S2.  Right upper chest continuous murmur gallop, no rub, PMI is normal size and placement, carotid upstroke normal without bruit, jugular venous pressure is normal Abdomen: Soft, non-tender, non-distended with normoactive bowel sounds. No hepatomegaly. No rebound/guarding. No obvious abdominal masses. Abdominal aorta is normal size without bruit Extremities: Trace to 1+ edema. no cyanosis, no clubbing, no ulcers  Peripheral : 2+ bilateral upper extremity pulses, 2+ bilateral femoral pulses, 2+ bilateral dorsal pedal pulse Neuro: Alert and oriented. No facial asymmetry. No focal deficit. Moves all extremities spontaneously. Musculoskeletal: Normal muscle tone without kyphosis Psych:  Responds to questions appropriately with a normal affect.    Assessment: 77 year old male with diabetes hypertension hyperlipidemia with acute on chronic diastolic dysfunction congestive heart failure due to aspiration  pneumonia and respiratory failure causing non-ST elevation myocardial infarction and demand ischemia without evidence of acute coronary syndrome  Plan: 1.  Continue supportive care of aspiration pneumonia and respiratory failure 2.  Continue medication management for hypertension control 3.  Dialysis without restriction 4 diastolic dysfunction heart failure and kidney failure 4.  No further cardiac intervention or diagnostics at this time 5.  Begin ambulation and follow for improvements of symptoms and further diagnostic and treatment options after above  Signed, Corey Skains M.D. [facility] 06/17/2018, 5:39 PM

## 2018-06-17 NOTE — Progress Notes (Signed)
PT Cancellation Note  Patient Details Name: Jonathan Mcgrath MRN: [id number] DOB: 08-17-1940   Cancelled Treatment:    Reason Eval/Treat Not Completed: Other (comment).  PT consult received.  Chart reviewed.  Pt with up-trending troponin to 0.66 today.  Will hold PT at this time until pt determined to be medically appropriate for physical therapy session.  Leitha Bleak, PT 06/17/18, 10:31 AM [phone]

## 2018-06-17 NOTE — Progress Notes (Signed)
HD Treatment Initiated    06/17/18 1258  Vital Signs  Pulse Rate 78  Pulse Rate Source Monitor  Resp 18  BP Location Right Arm  BP Method Automatic  Patient Position (if appropriate) Lying  Oxygen Therapy  SpO2 92 %  O2 Device Nasal Cannula  O2 Flow Rate (L/min) 4 L/min  During Hemodialysis Assessment  Blood Flow Rate (mL/min) 400 mL/min  Arterial Pressure (mmHg) -110 mmHg  Venous Pressure (mmHg) 200 mmHg  Transmembrane Pressure (mmHg) 60 mmHg  Ultrafiltration Rate (mL/min) 570 mL/min  Dialysate Flow Rate (mL/min) 600 ml/min  Conductivity: Machine  13.6  HD Safety Checks Performed Yes  Dialysis Fluid Bolus Normal Saline  Bolus Amount (mL) 250 mL  Intra-Hemodialysis Comments Tx initiated;Progressing as prescribed  Fistula / Graft Left Upper arm Arteriovenous fistula  No Placement Date or Time found.   Placed prior to admission: Yes  Orientation: Left  Access Location: Upper arm  Access Type: Arteriovenous fistula  Status Accessed  Needle Size 15

## 2018-06-17 NOTE — Progress Notes (Signed)
*  PRELIMINARY RESULTS* Echocardiogram 2D Echocardiogram has been performed.  Jonathan Mcgrath 06/17/2018, 9:04 AM

## 2018-06-17 NOTE — Progress Notes (Signed)
Post HD Treatment    06/17/18 1647  Neurological  Level of Consciousness Alert  Orientation Level Oriented X4  Respiratory  Respiratory Pattern Regular;Unlabored;Symmetrical  Chest Assessment Chest expansion symmetrical  Bilateral Breath Sounds Diminished  Cardiac  Pulse Regular  Heart Sounds S1, S2  Jugular Venous Distention (JVD) Yes  ECG Monitor Yes  Cardiac Rhythm NSR  Vascular  R Radial Pulse +2  L Radial Pulse +2  R Dorsalis Pedis Pulse +2  L Dorsalis Pedis Pulse +2  Edema Left lower extremity  LLE Edema Non-Pitting  Integumentary  Integumentary (WDL) X  Skin Color Appropriate for ethnicity  Skin Condition Dry  Musculoskeletal  Musculoskeletal (WDL) X  Generalized Weakness Yes  GU Assessment  Genitourinary (WDL) X (HD pt)  Psychosocial  Psychosocial (WDL) WDL

## 2018-06-17 NOTE — Progress Notes (Signed)
Pre HD Assessment    06/17/18 1240  Neurological  Level of Consciousness Alert  Orientation Level Oriented X4  Respiratory  Respiratory Pattern Regular;Unlabored;Symmetrical  Chest Assessment Chest expansion symmetrical  Bilateral Breath Sounds Diminished  Cough Weak  Cardiac  Pulse Regular  Heart Sounds S1, S2  Jugular Venous Distention (JVD) Yes  ECG Monitor Yes  Cardiac Rhythm NSR  Vascular  R Radial Pulse +2  L Radial Pulse +2  R Dorsalis Pedis Pulse +2  L Dorsalis Pedis Pulse +2  Edema Left lower extremity  LLE Edema Non-Pitting  Integumentary  Integumentary (WDL) X  Skin Color Appropriate for ethnicity  Skin Condition Dry  Musculoskeletal  Musculoskeletal (WDL) X  Generalized Weakness Yes  GU Assessment  Genitourinary (WDL) X (HD pt)  Psychosocial  Psychosocial (WDL) WDL

## 2018-06-18 ENCOUNTER — Other Ambulatory Visit (INDEPENDENT_AMBULATORY_CARE_PROVIDER_SITE_OTHER): Payer: Self-pay | Admitting: Nurse Practitioner

## 2018-06-18 NOTE — Progress Notes (Signed)
PT Cancellation Note  Patient Details Name: DEVEON KISIEL MRN: [id number] DOB: 05-19-1941   Cancelled Treatment:    Reason Eval/Treat Not Completed: Other (comment).  Downward trending troponin but has low O2 sats on bedside ck so will try again at another time.   Ramond Dial 06/18/2018, 8:42 AM   Mee Hives, PT MS Acute Rehab Dept. Number: [place] [facility] [place]

## 2018-06-18 NOTE — Progress Notes (Signed)
[facility]  ROUNDING NOTE   Subjective:  Patient completed hemodialysis yesterday. Resting comfortably in bed at the moment.  Objective:  Vital signs in last 24 hours:  Temp:  [98.2 F (36.8 C)-99.6 F (37.6 C)] 98.2 F (36.8 C) (12/03 0822) Pulse Rate:  [72-85] 76 (12/03 0822) Resp:  [17-30] 20 (12/03 0822) BP: (138-164)/(61-73) 150/69 (12/03 0822) SpO2:  [89 %-100 %] 93 % (12/03 0822) Weight:  [88.2 kg-92.5 kg] 88.2 kg (12/02 1645)  Weight change: -2.755 kg Filed Weights   06/16/18 1727 06/17/18 1241 06/17/18 1645  Weight: 90.9 kg 92.5 kg 88.2 kg    Intake/Output: I/O last 3 completed shifts: In: 5427 [P.O.:740; IV Piggyback:300] Out: 0623 [JSEGB:1517]   Intake/Output this shift:  No intake/output data recorded.  Physical Exam: General: No acute distress  Head: Normocephalic, atraumatic. Moist oral mucosal membranes  Eyes: Anicteric  Neck: Supple, trachea midline  Lungs:  Basilar rales, normal effort  Heart: S1S2 no rubs  Abdomen:  Soft, nontender, bowel sounds present  Extremities: trace peripheral edema.  Neurologic: Awake, alert, following commands  Skin: No lesions  Access: LUE AV access    Basic Metabolic Panel: Recent Labs  Lab 06/16/18 1348 06/17/18 0557  NA 143  --   K 3.4*  --   CL 101  --   CO2 29  --   GLUCOSE 118*  --   BUN 46*  --   CREATININE 8.37*  --   CALCIUM 8.1*  --   PHOS  --  5.8*    Liver Function Tests: Recent Labs  Lab 06/16/18 1348  AST 26  ALT 16  ALKPHOS 67  BILITOT 1.3*  PROT 6.1*  ALBUMIN 3.3*   Recent Labs  Lab 06/16/18 1348  LIPASE 31   No results for input(s): AMMONIA in the last 168 hours.  CBC: Recent Labs  Lab 06/16/18 1348  WBC 15.8*  NEUTROABS 7.5  HGB 7.5*  HCT 23.0*  MCV 93.5  PLT 122*    Cardiac Enzymes: Recent Labs  Lab 06/16/18 1348 06/17/18 0557 06/17/18 1729  TROPONINI 0.15* 0.66* 0.40*    BNP: Invalid input(s): POCBNP  CBG: No results for input(s):  GLUCAP in the last 168 hours.  Microbiology: Results for orders placed or performed during the hospital encounter of 05/16/18  MRSA PCR Screening     Status: None   Collection Time: 05/16/18  6:56 PM  Result Value Ref Range Status   MRSA by PCR NEGATIVE NEGATIVE Final    Comment:        The GeneXpert MRSA Assay (FDA approved for NASAL specimens only), is one component of a comprehensive MRSA colonization surveillance program. It is not intended to diagnose MRSA infection nor to guide or monitor treatment for MRSA infections. Performed at [facility], [address] [place], [place] [address]   CULTURE, BLOOD (ROUTINE X 2) w Reflex to ID Panel     Status: None   Collection Time: 05/17/18 11:12 PM  Result Value Ref Range Status   Specimen Description BLOOD RIGHT ANTECUBITAL  Final   Special Requests   Final    BOTTLES DRAWN AEROBIC AND ANAEROBIC Blood Culture adequate volume   Culture   Final    NO GROWTH 5 DAYS Performed at [facility], [address] [place], [place] [address]    Report Status 05/22/2018 FINAL  Final  CULTURE, BLOOD (ROUTINE X 2) w Reflex to ID Panel     Status: None  Collection Time: 05/17/18 11:19 PM  Result Value Ref Range Status   Specimen Description BLOOD RIGHT HAND  Final   Special Requests   Final    BOTTLES DRAWN AEROBIC AND ANAEROBIC Blood Culture results may not be optimal due to an excessive volume of blood received in culture bottles   Culture   Final    NO GROWTH 5 DAYS Performed at [facility], [address] [place], [place] [address]    Report Status 05/22/2018 FINAL  Final    Coagulation Studies: No results for input(s): LABPROT, INR in the last 72 hours.  Urinalysis: No results for input(s): COLORURINE, LABSPEC, PHURINE, GLUCOSEU, HGBUR, BILIRUBINUR, KETONESUR, PROTEINUR, UROBILINOGEN, NITRITE, LEUKOCYTESUR in the last 72 hours.  Invalid input(s): APPERANCEUR    Imaging: Dg Chest Portable 1  View  Result Date: 06/16/2018 CLINICAL DATA:  Nausea vomiting and diarrhea. EXAM: PORTABLE CHEST 1 VIEW COMPARISON:  04/18/2018 FINDINGS: There is a right IJ catheter with tip projecting over the cavoatrial junction. The heart size is mildly enlarged. There is mild diffuse pulmonary edema. No airspace consolidation identified. IMPRESSION: 1. Mild cardiac enlargement and pulmonary edema. Electronically Signed   By: Kerby Moors M.D.   On: 06/16/2018 14:26     Medications:   . ampicillin-sulbactam (UNASYN) IV Stopped (06/17/18 1828)   . amLODipine  10 mg Oral Daily  . aspirin EC  81 mg Oral Daily  . Chlorhexidine Gluconate Cloth  6 each Topical Q0600  . cholecalciferol  1,000 Units Oral Once per day on Mon Wed Fri  . cinacalcet  30 mg Oral Q breakfast  . epoetin (EPOGEN/PROCRIT) injection  4,000 Units Intravenous Q M,W,F-HD  . ferric citrate  210 mg Oral Daily  . heparin  5,000 Units Subcutaneous Q8H  . multivitamin  1 tablet Oral Daily   acetaminophen, albuterol, ondansetron **OR** ondansetron (ZOFRAN) IV  Assessment/ Plan:  77 y.o. male with end stage renal disease on hemodialysis,  hypertension, diabetes mellitus, GERD, anemia of CKD, SHPTH, colon cancer status post resection on 10/31 by Dr. [name], CLL followed by Dr. Janese Banks.   [facility] MWF L AVF 92kg  1.  ESRD on HD on MWF.  Patient completed hemodialysis yesterday.  No acute indication for dialysis today.  Next also start tomorrow.  2.  Anemia chronic kidney disease.  Continue Epogen with dialysis treatments.  3.  Secondary hyperparathyroidism.  Maintain the patient on pharynx citrate as well as Sensipar for mineral bone disease.  4.  Aspiration pneumonia.  Patient being treated with Unasyn at the moment.   LOS: 2 Aeliana Spates 12/3/201912:11 PM

## 2018-06-18 NOTE — Care Management Note (Signed)
Case Management Note  Patient Details  Name: Jonathan Mcgrath MRN: [id number] Date of Birth: 10-12-1940  Subjective/Objective:   Admitted to [facility] with the diagnosis of acute hypoxemia. Lives with son Izora Gala. Daughter is Ileene Hutchinson [phone]). Last seen Dr. Si Raider at [facility]   About 2 weeks ago,  Takes care of all basic activities of daily living himself, drives. Home Health per [facility] for IV antibiotics 06/04/18. No longer receiving antibiotics. No skilled facility. No home oxygen. Cane in the home if needed. [facility] prior to this admission. Fair - good appetite. Established dialysis for 3.5 years. Goes to [employer] in [place].  Brother Braulio Conte will transport.               Action/Plan: Elvera Bicker, [employer] representative updated. Will continue to follow for transition of care needs   Expected Discharge Date:                  Expected Discharge Plan:     In-House Referral:   yes  Discharge planning Services   yes  Post Acute Care Choice:    Choice offered to:     DME Arranged:    DME Agency:     HH Arranged:    HH Agency:     Status of Service:     If discussed at [employer] of Stay Meetings, dates discussed:    Additional Comments:  Shelbie Ammons, RN MSN CCM Care Management [phone] 06/18/2018, 1:38 PM

## 2018-06-18 NOTE — Evaluation (Signed)
Physical Therapy Evaluation Patient Details Name: Jonathan Mcgrath MRN: [id number] DOB: 1940-08-27 Today's Date: 06/18/2018   History of Present Illness  77 year old male admitted for a fall at home and was noted to have acute respiratory failure, aspiration PNA, pulm edema, elevated troponin, low hgb.  He is referred for mobility assessment and note PMHx:  EF 55%, HD, CKD, DM, colon CA, HTN, cardiac enlargement, hemicolectomy with primary anastomosis 05/16/18  Clinical Impression  Pt is up to walk with assistance but is not safe with minor help as was his PLOF.  Has just had colon surgery and is now weaker, appropriate for short stay in rehab to work on AD and balance with endurance to decrease home fall risk.  Follow acutely for strength, balance and control of gait with appropriate AD.  Son was not available for eval to ask questions but will continue toward his plan for SNF and work on gaining admission for his needs.    Follow Up Recommendations SNF    Equipment Recommendations  Rolling walker with 5" wheels    Recommendations for Other Services       Precautions / Restrictions Precautions Precautions: Fall Precaution Comments: pt was walking with no AD prior to his admission Restrictions Weight Bearing Restrictions: No      Mobility  Bed Mobility Overal bed mobility: Modified Independent             General bed mobility comments: using bed rail to get OOB  Transfers Overall transfer level: Needs assistance Equipment used: 1 person hand held assist Transfers: Sit to/from Stand Sit to Stand: Min assist         General transfer comment: min assist to stand up from height of usual bed surface at home.  Ambulation/Gait Ambulation/Gait assistance: Min assist Gait Distance (Feet): 50 Feet(25 x 2) Assistive device: 1 person hand held assist Gait Pattern/deviations: Step-to pattern;Step-through pattern;Decreased stride length;Wide base of support;Drifts  right/left(crossover steps at times) Gait velocity: reduced Gait velocity interpretation: <1.8 ft/sec, indicate of risk for recurrent falls    Stairs            Wheelchair Mobility    Modified Rankin (Stroke Patients Only)       Balance Overall balance assessment: Needs assistance;History of Falls Sitting-balance support: Feet supported;Bilateral upper extremity supported Sitting balance-Leahy Scale: Good     Standing balance support: Single extremity supported Standing balance-Leahy Scale: Poor                               Pertinent Vitals/Pain Pain Assessment: No/denies pain    Home Living Family/patient expects to be discharged to:: Private residence Living Arrangements: Children Available Help at Discharge: Family;Available PRN/intermittently Type of Home: House Home Access: Stairs to enter Entrance Stairs-Rails: Right Entrance Stairs-Number of Steps: 1 Home Layout: One level Home Equipment: Cane - single point Additional Comments: drove himself to get meals when son at work prior to arrival here    Prior Function Level of Independence: Independent with assistive device(s)         Comments: has been driving himself to dialysis and recently bought a cane due to unsteadiness on feet.     Hand Dominance   Dominant Hand: Right    Extremity/Trunk Assessment   Upper Extremity Assessment Upper Extremity Assessment: Overall WFL for tasks assessed    Lower Extremity Assessment Lower Extremity Assessment: Generalized weakness(4- hip abd and knee flexion)  Cervical / Trunk Assessment Cervical / Trunk Assessment: Normal  Communication   Communication: HOH  Cognition Arousal/Alertness: Awake/alert Behavior During Therapy: Flat affect Overall Cognitive Status: Within Functional Limits for tasks assessed                                 General Comments: seems to be able to follow instructions but is getting up alone with  O2 line in tow per sng      General Comments      Exercises     Assessment/Plan    PT Assessment Patient needs continued PT services  PT Problem List Decreased strength;Decreased range of motion;Decreased activity tolerance;Decreased balance;Decreased mobility;Decreased coordination;Decreased cognition;Decreased knowledge of use of DME;Decreased safety awareness;Decreased knowledge of precautions;Cardiopulmonary status limiting activity       PT Treatment Interventions DME instruction;Gait training;Stair training;Functional mobility training;Therapeutic activities;Therapeutic exercise;Balance training;Neuromuscular re-education;Patient/family education    PT Goals (Current goals can be found in the Care Plan section)  Acute Rehab PT Goals Patient Stated Goal: to go home and get stronger PT Goal Formulation: With patient Time For Goal Achievement: 07/02/18 Potential to Achieve Goals: Good    Frequency Min 2X/week   Barriers to discharge Inaccessible home environment;Decreased caregiver support pt is home alone at times    Co-evaluation               AM-PAC PT "6 Clicks" Mobility  Outcome Measure Help needed turning from your back to your side while in a flat bed without using bedrails?: A Little Help needed moving from lying on your back to sitting on the side of a flat bed without using bedrails?: A Little Help needed moving to and from a bed to a chair (including a wheelchair)?: A Little Help needed standing up from a chair using your arms (e.g., wheelchair or bedside chair)?: A Little Help needed to walk in hospital room?: A Little Help needed climbing 3-5 steps with a railing? : Total 6 Click Score: 16    End of Session Equipment Utilized During Treatment: Gait belt;Oxygen Activity Tolerance: Patient limited by fatigue;Other (comment)(weakness, O2 maintained 90% but higher at rest) Patient left: in bed;with call bell/phone within reach;with bed alarm set Nurse  Communication: Mobility status PT Visit Diagnosis: Unsteadiness on feet (R26.81);Muscle weakness (generalized) (M62.81);History of falling (Z91.81)    Time: 5170-0174 PT Time Calculation (min) (ACUTE ONLY): 25 min   Charges:   PT Evaluation $PT Eval Moderate Complexity: 1 Mod PT Treatments $Gait Training: 8-22 mins        Ramond Dial 06/18/2018, 2:34 PM   Mee Hives, PT MS Acute Rehab Dept. Number: [place] and [place]

## 2018-06-18 NOTE — Progress Notes (Signed)
[facility] [place] NAME: Jonathan Mcgrath    MR#:  [id number]  DATE OF BIRTH:  [birth year]  SUBJECTIVE:  CHIEF COMPLAINT:   Chief Complaint  Patient presents with  . Nausea  . Emesis   SOB still present but improving.  On 3 L O2.  Does not wear home oxygen.  REVIEW OF SYSTEMS:    Review of Systems  Constitutional: Positive for malaise/fatigue. Negative for chills and fever.  HENT: Negative for sore throat.   Eyes: Negative for blurred vision, double vision and pain.  Respiratory: Positive for cough and shortness of breath. Negative for hemoptysis and wheezing.   Cardiovascular: Negative for chest pain, palpitations, orthopnea and leg swelling.  Gastrointestinal: Negative for abdominal pain, constipation, diarrhea, heartburn, nausea and vomiting.  Genitourinary: Negative for dysuria and hematuria.  Musculoskeletal: Negative for back pain and joint pain.  Skin: Negative for rash.  Neurological: Negative for sensory change, speech change, focal weakness and headaches.  Endo/Heme/Allergies: Does not bruise/bleed easily.  Psychiatric/Behavioral: Negative for depression. The patient is not nervous/anxious.    DRUG ALLERGIES:  No Known Allergies  VITALS:  Blood pressure 139/61, pulse 76, temperature 98.9 F (37.2 C), temperature source Oral, resp. rate 20, height 6\' 1"  (1.854 m), weight 88.2 kg, SpO2 97 %.  PHYSICAL EXAMINATION:   Physical Exam  GENERAL:  77 y.o.-year-old patient lying in the bed with no acute distress.  EYES: Pupils equal, round, reactive to light and accommodation. No scleral icterus. Extraocular muscles intact.  HEENT: Head atraumatic, normocephalic. Oropharynx and nasopharynx clear.  NECK:  Supple, no jugular venous distention. No thyroid enlargement, no tenderness.  LUNGS: Bilateral crackles.  CARDIOVASCULAR: S1, S2 normal. No murmurs, rubs, or gallops.  ABDOMEN: Soft, nontender, nondistended. Bowel sounds  present. No organomegaly or mass.  EXTREMITIES: No cyanosis, clubbing or edema b/l.    NEUROLOGIC: Cranial nerves II through XII are intact. No focal Motor or sensory deficits b/l.   PSYCHIATRIC: The patient is alert and oriented x 3.  SKIN: No obvious rash, lesion, or ulcer.   LABORATORY PANEL:   CBC Recent Labs  Lab 06/16/18 1348  WBC 15.8*  HGB 7.5*  HCT 23.0*  PLT 122*   ------------------------------------------------------------------------------------------------------------------ Chemistries  Recent Labs  Lab 06/16/18 1348  NA 143  K 3.4*  CL 101  CO2 29  GLUCOSE 118*  BUN 46*  CREATININE 8.37*  CALCIUM 8.1*  AST 26  ALT 16  ALKPHOS 67  BILITOT 1.3*   ------------------------------------------------------------------------------------------------------------------  Cardiac Enzymes Recent Labs  Lab 06/17/18 1729  TROPONINI 0.40*   ------------------------------------------------------------------------------------------------------------------  RADIOLOGY:  Dg Chest Portable 1 View  Result Date: 06/16/2018 CLINICAL DATA:  Nausea vomiting and diarrhea. EXAM: PORTABLE CHEST 1 VIEW COMPARISON:  04/18/2018 FINDINGS: There is a right IJ catheter with tip projecting over the cavoatrial junction. The heart size is mildly enlarged. There is mild diffuse pulmonary edema. No airspace consolidation identified. IMPRESSION: 1. Mild cardiac enlargement and pulmonary edema. Electronically Signed   By: Kerby Moors M.D.   On: 06/16/2018 14:26     ASSESSMENT AND PLAN:   Jonathan Mcgrath  is a 77 y.o. male with a known history of end-stage renal disease on hemodialysis, diabetes not on any home medicines comes to the emergency room after he started having nausea and vomiting since yesterday feeling very weak and deconditioned. Patient felt weak today to the point he is knees gave out he went down to the floor without any injury. Son  got him to the side of the bed and  called EMS found sats 60% on room air. They put him on nasal cannula 6 L.  *Acute hypoxic respiratory failure secondary to combination of pulmonary edema and aspiration pneumonia Wean oxygen as tolerated.  *Aspiration pneumonia.  Patient had significant vomiting after which he had oxygen desaturations.   Procalcitonin was elevated.  Will repeat in the morning.  Continue IV Unasyn.  *Elevated troponin.   Cardiology consulted.  Likely demand ischemia.  No further investigations needed.  Echocardiogram showed no wall motion abnormalities.  No chest pain.  *End stage renal disease on hemodialysis.  With fluid overload on chest x-ray patient had hemodialysis yesterday.  Continue schedule same as outpatient  * History of invasive adenocarcinoma of the cecum. Patient follows with Dr. Janese Banks at the cancer center  * Chronic anemia  Stable  * DVT prophylaxis subcu heparin  All the records are reviewed and case discussed with Care Management/Social Worker Management plans discussed with the patient, family and they are in agreement.  CODE STATUS: DNR  DVT Prophylaxis: SCDs  TOTAL TIME TAKING CARE OF THIS PATIENT: 35 minutes.   POSSIBLE D/C IN 1-2 DAYS, DEPENDING ON CLINICAL CONDITION.  Leia Alf Tatum Massman M.D on 06/18/2018 at 1:15 PM  Between 7am to 6pm - Pager - [phone]  After 6pm go to [email or web address] - password [email or web address] [facility]  Office  [phone]  CC: Primary care physician; [employer]  Note: This dictation was prepared with Diplomatic Services operational officer dictation along with smaller Company secretary. Any transcriptional errors that result from this process are unintentional.

## 2018-06-18 NOTE — Clinical Social Work Note (Signed)
Clinical Social Work Assessment  Patient Details  Name: Jonathan Mcgrath MRN: [id number] Date of Birth: [birth year]  Date of referral:  06/18/18               Reason for consult:  Facility Placement                Permission sought to share information with:  Case Manager, Customer service manager, Family Supports Permission granted to share information::  Yes, Verbal Permission Granted  Name::      SNF  Agency::   [facility]   Relationship::     Contact Information:     Housing/Transportation Living arrangements for the past 2 months:  [place] of Information:  Patient Patient Interpreter Needed:  None Criminal Activity/Legal Involvement Pertinent to Current Situation/Hospitalization:  No - Comment as needed Significant Relationships:  Adult Children Lives with:  Adult Children Do you feel safe going back to the place where you live?  Yes Need for family participation in patient care:  Yes (Comment)  Care giving concerns:  Patient lives with his son    Facilities manager / plan:  CSW consulted for SNF placement. CSW met with patient at bedside. CSW introduced self and explained role. Patient reports that he lives with his son and would like to return home. CSW explained PT recommendation. Patient states that he is open to going to SNF and preference would be [facility]. Patient reports that he is hoping to get some therapy here and be well enough to go home at discharge but if he needs SNF he will go to [facility]. CSW initiated bed search and will give offers once received. CSW will follow for discharge planning.   Employment status:  Retired Nurse, adult PT Recommendations:  [place] / Referral to community resources:  [place]  Patient/Family's Response to care:  Patient thanked CSW for assistance   Patient/Family's Understanding of and Emotional Response to Diagnosis, Current  Treatment, and Prognosis:  Patient understands current diagnoses and treatment.   Emotional Assessment Appearance:  Appears stated age Attitude/Demeanor/Rapport:    Affect (typically observed):  Accepting, Hopeful, Pleasant Orientation:  Oriented to Self, Oriented to Place, Oriented to  Time Alcohol / Substance use:  Not Applicable Psych involvement (Current and /or in the community):  No (Comment)  Discharge Needs  Concerns to be addressed:  Discharge Planning Concerns Readmission within the last 30 days:  No Current discharge risk:  None Barriers to Discharge:  Continued Medical Work up   [employer], [place] 06/18/2018, 4:29 PM

## 2018-06-18 NOTE — NC FL2 (Signed)
[place] LEVEL OF CARE SCREENING TOOL     IDENTIFICATION  Patient Name: Jonathan Mcgrath Birthdate: 03/18/1941 Sex: male Admission Date (Current Location): 06/16/2018  [place] and [place] Number:  Engineering geologist and Address:  [facility], [address] [place], [place] [address]      Provider Number: [id number]  Attending Physician Name and Address:  Hillary Bow, MD  Relative Name and Phone Number:       Current Level of Care: Hospital Recommended Level of Care: [place] Prior Approval Number:    Date Approved/Denied:   PASRR Number: [id number]  Discharge Plan: SNF    Current Diagnoses: Patient Active Problem List   Diagnosis Date Noted  . Acute hypoxemic respiratory failure ([place]) 06/16/2018  . Colon cancer ([place]) 05/16/2018  . Benign neoplasm of ascending colon   . Benign neoplasm of transverse colon   . Polyp of sigmoid colon   . Colon neoplasm   . Mass of cecum   . Diarrhea   . Stomach irritation   . Abdominal pain 04/12/2018  . CAP (community acquired pneumonia) 08/17/2016    Orientation RESPIRATION BLADDER Height & Weight     Self, Time, Place  O2(3 liters ) Continent Weight: 194 lb 7.1 oz (88.2 kg) Height:  6\' 1"  (185.4 cm)  BEHAVIORAL SYMPTOMS/MOOD NEUROLOGICAL BOWEL NUTRITION STATUS  (none) (none) Continent Diet(Renal with fluid restriction)  AMBULATORY STATUS COMMUNICATION OF NEEDS Skin   Extensive Assist Verbally Normal                       Personal Care Assistance Level of Assistance  Bathing, Feeding, Dressing Bathing Assistance: Limited assistance Feeding assistance: Independent Dressing Assistance: Limited assistance     Functional Limitations Info  Sight, Hearing, Speech Sight Info: Adequate Hearing Info: Adequate Speech Info: Adequate    SPECIAL CARE FACTORS FREQUENCY  PT (By licensed PT), OT (By licensed OT)     PT Frequency: 5 OT Frequency: 5            Contractures Contractures Info: Not present    Additional Factors Info  Code Status, Allergies Code Status Info: DNR Allergies Info: NKA           Current Medications (06/18/2018):  This is the current hospital active medication list Current Facility-Administered Medications  Medication Dose Route Frequency Provider Last Rate Last Dose  . acetaminophen (TYLENOL) tablet 650 mg  650 mg Oral Q8H PRN Fritzi Mandes, MD      . albuterol (PROVENTIL) (2.5 MG/3ML) 0.083% nebulizer solution 2.5 mg  2.5 mg Nebulization Q2H PRN Fritzi Mandes, MD      . amLODipine (NORVASC) tablet 10 mg  10 mg Oral Daily Fritzi Mandes, MD   10 mg at 06/18/18 0921  . Ampicillin-Sulbactam (UNASYN) 3 g in sodium chloride 0.9 % 100 mL IVPB  3 g Intravenous Q24H Fritzi Mandes, MD   Stopped at 06/17/18 1828  . aspirin EC tablet 81 mg  81 mg Oral Daily Fritzi Mandes, MD   81 mg at 06/18/18 0109  . Chlorhexidine Gluconate Cloth 2 % PADS 6 each  6 each Topical Q0600 Murlean Iba, MD   6 each at 06/18/18 0607  . cholecalciferol (VITAMIN D3) tablet 1,000 Units  1,000 Units Oral Once per day on Mon Wed Fri Patel, Sona, MD      . cinacalcet [facility]) tablet 30 mg  30 mg Oral Q breakfast Fritzi Mandes, MD   30  mg at 06/18/18 0920  . epoetin alfa (EPOGEN,PROCRIT) injection 4,000 Units  4,000 Units Intravenous Q M,W,F-HD Murlean Iba, MD   4,000 Units at 06/17/18 1531  . ferric citrate (AURYXIA) tablet 210 mg  210 mg Oral Daily Fritzi Mandes, MD   210 mg at 06/18/18 0920  . heparin injection 5,000 Units  5,000 Units Subcutaneous Q8H Fritzi Mandes, MD   5,000 Units at 06/17/18 [phone]  . multivitamin (RENA-VIT) tablet 1 tablet  1 tablet Oral Daily Fritzi Mandes, MD   1 tablet at 06/18/18 0920  . ondansetron (ZOFRAN) tablet 4 mg  4 mg Oral Q6H PRN Fritzi Mandes, MD       Or  . ondansetron [facility]) injection 4 mg  4 mg Intravenous Q6H PRN Fritzi Mandes, MD         Discharge Medications: Please see discharge summary for a list of discharge  medications.  Relevant Imaging Results:  Relevant Lab Results:   Additional Information SSN: [id number]  Annamaria Boots, [place]

## 2018-06-19 ENCOUNTER — Ambulatory Visit: Admission: RE | Admit: 2018-06-19 | Payer: Medicare HMO | Source: Ambulatory Visit | Admitting: Vascular Surgery

## 2018-06-19 ENCOUNTER — Inpatient Hospital Stay: Payer: Medicare HMO

## 2018-06-19 ENCOUNTER — Encounter
Admission: EM | Disposition: A | Discharge status: Skilled Nursing Facility | Payer: Self-pay | Source: Home / Self Care | Attending: Internal Medicine

## 2018-06-19 ENCOUNTER — Encounter: Payer: Self-pay | Admitting: Emergency Medicine

## 2018-06-19 DIAGNOSIS — I776 Arteritis, unspecified: Secondary | ICD-10-CM

## 2018-06-19 HISTORY — PX: DIALYSIS/PERMA CATHETER REMOVAL: CATH118289

## 2018-06-19 LAB — CBC WITH DIFFERENTIAL/PLATELET
Abs Immature Granulocytes: 0.03 10*3/uL (ref 0.00–0.07)
Basophils Absolute: 0 10*3/uL (ref 0.0–0.1)
Basophils Relative: 0 %
Eosinophils Absolute: 0.2 10*3/uL (ref 0.0–0.5)
Eosinophils Relative: 2 %
HCT: 21.9 % — ABNORMAL LOW (ref 39.0–52.0)
Hemoglobin: 7.1 g/dL — ABNORMAL LOW (ref 13.0–17.0)
Immature Granulocytes: 0 %
Lymphocytes Relative: 53 %
Lymphs Abs: 5.2 10*3/uL — ABNORMAL HIGH (ref 0.7–4.0)
MCH: 30.5 pg (ref 26.0–34.0)
MCHC: 32.4 g/dL (ref 30.0–36.0)
MCV: 94 fL (ref 80.0–100.0)
Monocytes Absolute: 0.9 10*3/uL (ref 0.1–1.0)
Monocytes Relative: 9 %
Neutro Abs: 3.6 10*3/uL (ref 1.7–7.7)
Neutrophils Relative %: 36 %
Platelets: 96 10*3/uL — ABNORMAL LOW (ref 150–400)
RBC: 2.33 MIL/uL — ABNORMAL LOW (ref 4.22–5.81)
RDW: 17.2 % — ABNORMAL HIGH (ref 11.5–15.5)
WBC: 10 10*3/uL (ref 4.0–10.5)
nRBC: 0 % (ref 0.0–0.2)

## 2018-06-19 LAB — BASIC METABOLIC PANEL
Anion gap: 9 (ref 5–15)
BUN: 45 mg/dL — ABNORMAL HIGH (ref 8–23)
CO2: 32 mmol/L (ref 22–32)
Calcium: 7.7 mg/dL — ABNORMAL LOW (ref 8.9–10.3)
Chloride: 99 mmol/L (ref 98–111)
Creatinine, Ser: 8.31 mg/dL — ABNORMAL HIGH (ref 0.61–1.24)
GFR calc Af Amer: 6 mL/min — ABNORMAL LOW (ref >60)
GFR calc non Af Amer: 6 mL/min — ABNORMAL LOW (ref >60)
Glucose, Bld: 85 mg/dL (ref 70–99)
Potassium: 3.6 mmol/L (ref 3.5–5.1)
Sodium: 140 mmol/L (ref 135–145)

## 2018-06-19 LAB — PROCALCITONIN: Procalcitonin: 12.08 ng/mL

## 2018-06-19 SURGERY — DIALYSIS/PERMA CATHETER REMOVAL
Anesthesia: LOCAL

## 2018-06-19 MED ORDER — CEFAZOLIN SODIUM-DEXTROSE 1-4 GM/50ML-% IV SOLN
1.0000 g | Freq: Once | INTRAVENOUS | Status: AC
  Administered 2018-06-19: 1 g via INTRAVENOUS
  Filled 2018-06-19: qty 50

## 2018-06-19 SURGICAL SUPPLY — 2 items
FORCEPS HALSTEAD CVD 5IN STRL (INSTRUMENTS) ×2 IMPLANT
TRAY LACERAT/PLASTIC (MISCELLANEOUS) ×2 IMPLANT

## 2018-06-19 NOTE — Clinical Social Work Note (Signed)
CSW gave bed offers to patient and he chose [facility]. CSW notified Otila Kluver at [facility] and they have already started authorization. CSW will continue to follow for discharge planning.   [place], [place]

## 2018-06-19 NOTE — Progress Notes (Signed)
Daughter called this morning to inquire about patient mental status. She had noticed odd behavior at home and wanted to know if patient could be tested for dementia. Notified daughter that there was no test but he could been seen outpatient by Neurology per Dr. Posey Pronto to have some cognitive testing. Dr. Posey Pronto to order the outpatient referral.

## 2018-06-19 NOTE — Op Note (Signed)
Operative Note     Preoperative diagnosis:   1.  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  Same as above  Procedure:  Removal of right jugular Hickman catheter  Surgeon:  Leotis Pain, MD  Anesthesia:  Local  Assistant: Hezzie Bump, PA-C  EBL:  Minimal  Indication for the Procedure:  The patient has completed their intravenous therapy for aortitis and no longer needs their venous access.  This can be removed.  Risks and benefits are discussed and informed consent is obtained. An assistant was present during the procedure to help facilitate the exposure and expedite the procedure as well as to gain experience for the assistant for future procedures.  Description of the Procedure:  The patient's right neck, chest and existing catheter were sterilely prepped and draped. The area around the catheter was anesthetized copiously with 1% lidocaine. The catheter was dissected out with curved hemostats until the cuff was freed from the surrounding fibrous sheath. The fiber sheath was transected, and the catheter was then removed in its entirety using gentle traction. Pressure was held and sterile dressings were placed. The patient tolerated the procedure well and was taken to the recovery room in stable condition.     Leotis Pain  06/19/2018, 4:22 PM This note was created with Dragon Medical transcription system. Any errors in dictation are purely unintentional.

## 2018-06-19 NOTE — Clinical Social Work Note (Signed)
CSW spoke with patient's daughter Ileene Hutchinson [phone]. CSW explained PT recommendation and bed choice of [facility]. Daughter is in agreement with discharge to [facility]. CSW answered questions regarding discharge and insurance coverage.   [place], [place]

## 2018-06-19 NOTE — Progress Notes (Signed)
Post HD Assessment    06/19/18 1550  Neurological  Level of Consciousness Alert  Orientation Level Oriented X4  Respiratory  Respiratory Pattern Regular;Unlabored;Symmetrical  Chest Assessment Chest expansion symmetrical  Bilateral Breath Sounds Diminished  Cardiac  Pulse Regular  Heart Sounds S1, S2  Jugular Venous Distention (JVD) Yes  ECG Monitor Yes  Cardiac Rhythm NSR  Vascular  R Radial Pulse +2  L Radial Pulse +2  R Dorsalis Pedis Pulse +2  L Dorsalis Pedis Pulse +2  Edema  (No edema noted )  Integumentary  Integumentary (WDL) X  Skin Color Appropriate for ethnicity  Skin Condition Dry  Skin Integrity Catheter entry/exit site  Musculoskeletal  Musculoskeletal (WDL) WDL  GU Assessment  Genitourinary (WDL) X (HD pt)  Psychosocial  Psychosocial (WDL) WDL

## 2018-06-19 NOTE — Progress Notes (Signed)
HD Treatment Initiated    06/19/18 1139  Vital Signs  Pulse Rate 69  Resp 17  During Hemodialysis Assessment  Blood Flow Rate (mL/min) 400 mL/min  Arterial Pressure (mmHg) -140 mmHg  Venous Pressure (mmHg) 170 mmHg  Transmembrane Pressure (mmHg) 70 mmHg  Ultrafiltration Rate (mL/min) 570 mL/min  Dialysate Flow Rate (mL/min) 800 ml/min  Conductivity: Machine  14  HD Safety Checks Performed Yes  Dialysis Fluid Bolus Normal Saline  Bolus Amount (mL) 250 mL  Intra-Hemodialysis Comments Tx initiated;Progressing as prescribed  Fistula / Graft Left Upper arm Arteriovenous fistula  No Placement Date or Time found.   Placed prior to admission: Yes  Orientation: Left  Access Location: Upper arm  Access Type: Arteriovenous fistula  Status Accessed  Needle Size 15

## 2018-06-19 NOTE — Progress Notes (Signed)
PT Cancellation Note  Patient Details Name: Jonathan Mcgrath MRN: [id number] DOB: 23-May-1941   Cancelled Treatment:    Reason Eval/Treat Not Completed: Other (comment).  Pt currently off floor at dialysis.  Will re-attempt PT treatment session at a later date/time.  Leitha Bleak, PT 06/19/18, 1:48 PM [phone]

## 2018-06-19 NOTE — Progress Notes (Signed)
[place] [facility] [place] NAME: Jonathan Mcgrath    MR#:  [id number]  DATE OF BIRTH:  [birth year]  SUBJECTIVE:  CHIEF COMPLAINT:   Chief Complaint  Patient presents with  . Nausea  . Emesis   Patient states that he is breathing is improved.  He still on 2 L oxygen here  REVIEW OF SYSTEMS:    Review of Systems  Constitutional: Positive for malaise/fatigue. Negative for chills and fever.  HENT: Negative for sore throat.   Eyes: Negative for blurred vision, double vision and pain.  Respiratory: Positive for cough and shortness of breath. Negative for hemoptysis and wheezing.   Cardiovascular: Negative for chest pain, palpitations, orthopnea and leg swelling.  Gastrointestinal: Negative for abdominal pain, constipation, diarrhea, heartburn, nausea and vomiting.  Genitourinary: Negative for dysuria and hematuria.  Musculoskeletal: Negative for back pain and joint pain.  Skin: Negative for rash.  Neurological: Negative for sensory change, speech change, focal weakness and headaches.  Endo/Heme/Allergies: Does not bruise/bleed easily.  Psychiatric/Behavioral: Negative for depression. The patient is not nervous/anxious.    DRUG ALLERGIES:  No Known Allergies  VITALS:  Blood pressure 138/67, pulse 63, temperature 97.9 F (36.6 C), temperature source Oral, resp. rate 13, height 6\' 1"  (1.854 m), weight 89.9 kg, SpO2 100 %.  PHYSICAL EXAMINATION:   Physical Exam  GENERAL:  77 y.o.-year-old patient lying in the bed with no acute distress.  EYES: Pupils equal, round, reactive to light and accommodation. No scleral icterus. Extraocular muscles intact.  HEENT: Head atraumatic, normocephalic. Oropharynx and nasopharynx clear.  NECK:  Supple, no jugular venous distention. No thyroid enlargement, no tenderness.  LUNGS: Bilateral crackles.  CARDIOVASCULAR: S1, S2 normal. No murmurs, rubs, or gallops.  ABDOMEN: Soft, nontender, nondistended. Bowel sounds  present. No organomegaly or mass.  EXTREMITIES: No cyanosis, clubbing or edema b/l.    NEUROLOGIC: Cranial nerves II through XII are intact. No focal Motor or sensory deficits b/l.   PSYCHIATRIC: The patient is alert and oriented x 3.  SKIN: No obvious rash, lesion, or ulcer.   LABORATORY PANEL:   CBC Recent Labs  Lab 06/19/18 0507  WBC 10.0  HGB 7.1*  HCT 21.9*  PLT 96*   ------------------------------------------------------------------------------------------------------------------ Chemistries  Recent Labs  Lab 06/16/18 1348 06/19/18 0507  NA 143 140  K 3.4* 3.6  CL 101 99  CO2 29 32  GLUCOSE 118* 85  BUN 46* 45*  CREATININE 8.37* 8.31*  CALCIUM 8.1* 7.7*  AST 26  --   ALT 16  --   ALKPHOS 67  --   BILITOT 1.3*  --    ------------------------------------------------------------------------------------------------------------------  Cardiac Enzymes Recent Labs  Lab 06/17/18 1729  TROPONINI 0.40*   ------------------------------------------------------------------------------------------------------------------  RADIOLOGY:  No results found.   ASSESSMENT AND PLAN:   Jonathan Mcgrath  is a 77 y.o. male with a known history of end-stage renal disease on hemodialysis, diabetes not on any home medicines comes to the emergency room after he started having nausea and vomiting since yesterday feeling very weak and deconditioned. Patient felt weak today to the point he is knees gave out he went down to the floor without any injury. Son got him to the side of the bed and called EMS found sats 60% on room air. They put him on nasal cannula 6 L.  *Acute hypoxic respiratory failure secondary to combination of pulmonary edema and aspiration pneumonia Wean oxygen as tolerated.  *Aspiration pneumonia.  Patient had significant vomiting after which  he had oxygen desaturations.   Procalcitonin was elevated.  Unclear significance and patient with renal failure We will  repeat chest x-ray  *Elevated troponin.   Cardiology consulted.  Likely demand ischemia.  No further investigations needed.  Echocardiogram showed no wall motion abnormalities.  No chest pain.  *End stage renal disease on hemodialysis.  With fluid overload on chest x-ray patient had hemodialysis yesterday.  Continue schedule same as outpatient  * History of invasive adenocarcinoma of the cecum. Patient follows with Dr. Janese Banks at the cancer center  * Chronic anemia  Hemoglobin trending down I will repeat a CBC tomorrow to see if patient needs any transfusion  * DVT prophylaxis subcu heparin  All the records are reviewed and case discussed with Care Management/Social Worker Management plans discussed with the patient, family and they are in agreement.  CODE STATUS: DNR  DVT Prophylaxis: SCDs  TOTAL TIME TAKING CARE OF THIS PATIENT: 35 minutes.   POSSIBLE D/C IN 1-2 DAYS, DEPENDING ON CLINICAL CONDITION.  Dustin Flock M.D on 06/19/2018 at 3:33 PM  Between 7am to 6pm - Pager - [phone]  After 6pm go to [email or web address] - password [email or web address] [place] [facility]  Office  [phone]  CC: Primary care physician; [employer]  Note: This dictation was prepared with Diplomatic Services operational officer dictation along with smaller Company secretary. Any transcriptional errors that result from this process are unintentional.

## 2018-06-19 NOTE — Progress Notes (Signed)
Post HD Treatment    06/19/18 1545  Hand-Off documentation  Report given to (Full Name) Ivin Booty, RN and [employer], RN  Report received from (Full Name) Stephannie Peters, RN  Vital Signs  Temp (!) 97.5 F (36.4 C)  Temp Source Oral  Pulse Rate 73  Pulse Rate Source Monitor  Resp 18  BP (!) 155/59  BP Location Right Arm  BP Method Automatic  Patient Position (if appropriate) Sitting  Oxygen Therapy  SpO2 100 %  O2 Device Nasal Cannula  O2 Flow Rate (L/min) 2 L/min  Pain Assessment  Pain Scale 0-10  Pain Score 0  Dialysis Weight  Weight 87.7 kg  Type of Weight Post-Dialysis  Post-Hemodialysis Assessment  Rinseback Volume (mL) 250 mL  KECN 277 V  Dialyzer Clearance Lightly streaked  Duration of HD Treatment -hour(s) 3.5 hour(s)  Hemodialysis Intake (mL) 750 mL  UF Total -Machine (mL) 2269 mL  Net UF (mL) 1519 mL  Tolerated HD Treatment Yes  Post-Hemodialysis Comments Pt tolerated well  AVG/AVF Arterial Site Held (minutes) 10 minutes  AVG/AVF Venous Site Held (minutes) 10 minutes  Fistula / Graft Left Upper arm Arteriovenous fistula  No Placement Date or Time found.   Placed prior to admission: Yes  Orientation: Left  Access Location: Upper arm  Access Type: Arteriovenous fistula  Site Condition No complications  Fistula / Graft Assessment Present;Thrill;Bruit  Drainage Description None

## 2018-06-19 NOTE — Progress Notes (Signed)
HD Treatment Complete    06/19/18 1530  Vital Signs  Pulse Rate 72  Pulse Rate Source Monitor  Resp 16  BP (!) 167/57  BP Location Right Arm  BP Method Automatic  Oxygen Therapy  SpO2 100 %  O2 Device Nasal Cannula  O2 Flow Rate (L/min) 2 L/min  Pain Assessment  Pain Scale 0-10  Pain Score 0  Dialysis Weight  Weight 87.7 kg  Type of Weight Post-Dialysis  During Hemodialysis Assessment  Blood Flow Rate (mL/min) 400 mL/min  Arterial Pressure (mmHg) -220 mmHg  Venous Pressure (mmHg) 240 mmHg  Transmembrane Pressure (mmHg) 80 mmHg  Ultrafiltration Rate (mL/min) 730 mL/min  Dialysate Flow Rate (mL/min) 800 ml/min  Conductivity: Machine  14.1  HD Safety Checks Performed Yes  Intra-Hemodialysis Comments Progressing as prescribed (2269)  Fistula / Graft Left Upper arm Arteriovenous fistula  No Placement Date or Time found.   Placed prior to admission: Yes  Orientation: Left  Access Location: Upper arm  Access Type: Arteriovenous fistula  Status Deaccessed

## 2018-06-19 NOTE — Progress Notes (Signed)
[facility]  ROUNDING NOTE   Subjective:  Patient sitting up in chair. Due for dialysis today.  Objective:  Vital signs in last 24 hours:  Temp:  [97.7 F (36.5 C)-98.9 F (37.2 C)] 98 F (36.7 C) (12/04 0815) Pulse Rate:  [68-76] 70 (12/04 0815) Resp:  [16-20] 16 (12/04 0815) BP: (133-144)/(57-62) 144/62 (12/04 0815) SpO2:  [94 %-100 %] 96 % (12/04 0815)  Weight change:  Filed Weights   06/16/18 1727 06/17/18 1241 06/17/18 1645  Weight: 90.9 kg 92.5 kg 88.2 kg    Intake/Output: No intake/output data recorded.   Intake/Output this shift:  No intake/output data recorded.  Physical Exam: General: No acute distress  Head: Normocephalic, atraumatic. Moist oral mucosal membranes  Eyes: Anicteric  Neck: Supple, trachea midline  Lungs:  Basilar rales, normal effort  Heart: S1S2 no rubs  Abdomen:  Soft, nontender, bowel sounds present  Extremities: trace peripheral edema.  Neurologic: Awake, alert, following commands  Skin: No lesions  Access: LUE AV access    Basic Metabolic Panel: Recent Labs  Lab 06/16/18 1348 06/17/18 0557 06/19/18 0507  NA 143  --  140  K 3.4*  --  3.6  CL 101  --  99  CO2 29  --  32  GLUCOSE 118*  --  85  BUN 46*  --  45*  CREATININE 8.37*  --  8.31*  CALCIUM 8.1*  --  7.7*  PHOS  --  5.8*  --     Liver Function Tests: Recent Labs  Lab 06/16/18 1348  AST 26  ALT 16  ALKPHOS 67  BILITOT 1.3*  PROT 6.1*  ALBUMIN 3.3*   Recent Labs  Lab 06/16/18 1348  LIPASE 31   No results for input(s): AMMONIA in the last 168 hours.  CBC: Recent Labs  Lab 06/16/18 1348 06/19/18 0507  WBC 15.8* 10.0  NEUTROABS 7.5 3.6  HGB 7.5* 7.1*  HCT 23.0* 21.9*  MCV 93.5 94.0  PLT 122* 96*    Cardiac Enzymes: Recent Labs  Lab 06/16/18 1348 06/17/18 0557 06/17/18 1729  TROPONINI 0.15* 0.66* 0.40*    BNP: Invalid input(s): POCBNP  CBG: No results for input(s): GLUCAP in the last 168 hours.  Microbiology: Results  for orders placed or performed during the hospital encounter of 05/16/18  MRSA PCR Screening     Status: None   Collection Time: 05/16/18  6:56 PM  Result Value Ref Range Status   MRSA by PCR NEGATIVE NEGATIVE Final    Comment:        The GeneXpert MRSA Assay (FDA approved for NASAL specimens only), is one component of a comprehensive MRSA colonization surveillance program. It is not intended to diagnose MRSA infection nor to guide or monitor treatment for MRSA infections. Performed at [facility], [address] [place], [place] [address]   CULTURE, BLOOD (ROUTINE X 2) w Reflex to ID Panel     Status: None   Collection Time: 05/17/18 11:12 PM  Result Value Ref Range Status   Specimen Description BLOOD RIGHT ANTECUBITAL  Final   Special Requests   Final    BOTTLES DRAWN AEROBIC AND ANAEROBIC Blood Culture adequate volume   Culture   Final    NO GROWTH 5 DAYS Performed at [facility], [address] [place], [place] [address]    Report Status 05/22/2018 FINAL  Final  CULTURE, BLOOD (ROUTINE X 2) w Reflex to ID Panel     Status: None  Collection Time: 05/17/18 11:19 PM  Result Value Ref Range Status   Specimen Description BLOOD RIGHT HAND  Final   Special Requests   Final    BOTTLES DRAWN AEROBIC AND ANAEROBIC Blood Culture results may not be optimal due to an excessive volume of blood received in culture bottles   Culture   Final    NO GROWTH 5 DAYS Performed at [facility], [address] [place], [place] [address]    Report Status 05/22/2018 FINAL  Final    Coagulation Studies: No results for input(s): LABPROT, INR in the last 72 hours.  Urinalysis: No results for input(s): COLORURINE, LABSPEC, PHURINE, GLUCOSEU, HGBUR, BILIRUBINUR, KETONESUR, PROTEINUR, UROBILINOGEN, NITRITE, LEUKOCYTESUR in the last 72 hours.  Invalid input(s): APPERANCEUR    Imaging: No results found.   Medications:   . ampicillin-sulbactam (UNASYN) IV 3  g (06/18/18 1743)   . amLODipine  10 mg Oral Daily  . aspirin EC  81 mg Oral Daily  . Chlorhexidine Gluconate Cloth  6 each Topical Q0600  . cholecalciferol  1,000 Units Oral Once per day on Mon Wed Fri  . cinacalcet  30 mg Oral Q breakfast  . epoetin (EPOGEN/PROCRIT) injection  4,000 Units Intravenous Q M,W,F-HD  . ferric citrate  210 mg Oral Daily  . heparin  5,000 Units Subcutaneous Q8H  . multivitamin  1 tablet Oral Daily   acetaminophen, albuterol, ondansetron **OR** ondansetron (ZOFRAN) IV  Assessment/ Plan:  77 y.o. male with end stage renal disease on hemodialysis,  hypertension, diabetes mellitus, GERD, anemia of CKD, SHPTH, colon cancer status post resection on 10/31 by Dr. [name], CLL followed by Dr. Janese Banks.   [facility] MWF L AVF 92kg  1.  ESRD on HD on MWF.  Patient due for hemodialysis today.  Orders have been prepared.  2.  Anemia chronic kidney disease.  Hemoglobin down to 7.1.  Continue Epogen 4000 units IV with dialysis.  Consider blood transfusion if hemoglobin drops any further.  3.  Secondary hyperparathyroidism.  Continue Auryxia as well as Sensipar for phosphorus and PTH control.  4.  Aspiration pneumonia.  Patient being treated with Unasyn at the moment.   LOS: 3 Lathan Gieselman 12/4/201910:37 AM

## 2018-06-19 NOTE — Progress Notes (Signed)
Pre HD Assessment    06/19/18 1128  Neurological  Level of Consciousness Alert  Orientation Level Oriented X4  Respiratory  Respiratory Pattern Regular;Unlabored;Symmetrical  Chest Assessment Chest expansion symmetrical  Bilateral Breath Sounds Diminished  Cough None  Cardiac  Pulse Regular  Heart Sounds S1, S2  Jugular Venous Distention (JVD) Yes  ECG Monitor Yes  Cardiac Rhythm NSR  Vascular  R Radial Pulse +2  L Radial Pulse +2  R Dorsalis Pedis Pulse +2  L Dorsalis Pedis Pulse +2  Edema  (No edema noted )  Integumentary  Integumentary (WDL) X  Skin Color Appropriate for ethnicity  Skin Condition Dry  Skin Integrity Catheter entry/exit site  Musculoskeletal  Musculoskeletal (WDL) WDL  GU Assessment  Genitourinary (WDL) X (HD pt)  Psychosocial  Psychosocial (WDL) WDL

## 2018-06-20 ENCOUNTER — Encounter: Payer: Self-pay | Admitting: Vascular Surgery

## 2018-06-20 LAB — BASIC METABOLIC PANEL
Anion gap: 9 (ref 5–15)
BUN: 26 mg/dL — ABNORMAL HIGH (ref 8–23)
CO2: 32 mmol/L (ref 22–32)
Calcium: 8.1 mg/dL — ABNORMAL LOW (ref 8.9–10.3)
Chloride: 102 mmol/L (ref 98–111)
Creatinine, Ser: 5.56 mg/dL — ABNORMAL HIGH (ref 0.61–1.24)
GFR calc Af Amer: 11 mL/min — ABNORMAL LOW (ref >60)
GFR calc non Af Amer: 9 mL/min — ABNORMAL LOW (ref >60)
Glucose, Bld: 92 mg/dL (ref 70–99)
Potassium: 3.6 mmol/L (ref 3.5–5.1)
Sodium: 143 mmol/L (ref 135–145)

## 2018-06-20 LAB — CBC
HCT: 23.8 % — ABNORMAL LOW (ref 39.0–52.0)
Hemoglobin: 7.4 g/dL — ABNORMAL LOW (ref 13.0–17.0)
MCH: 29.4 pg (ref 26.0–34.0)
MCHC: 31.1 g/dL (ref 30.0–36.0)
MCV: 94.4 fL (ref 80.0–100.0)
Platelets: 120 10*3/uL — ABNORMAL LOW (ref 150–400)
RBC: 2.52 MIL/uL — ABNORMAL LOW (ref 4.22–5.81)
RDW: 17.2 % — ABNORMAL HIGH (ref 11.5–15.5)
WBC: 10.1 10*3/uL (ref 4.0–10.5)
nRBC: 0 % (ref 0.0–0.2)

## 2018-06-20 LAB — PROCALCITONIN: Procalcitonin: 7.65 ng/mL

## 2018-06-20 MED ORDER — AMOXICILLIN-POT CLAVULANATE 875-125 MG PO TABS: 1.0000 | ORAL_TABLET | Freq: Two times a day (BID) | ORAL | 0 refills | Status: AC

## 2018-06-20 MED ORDER — ASPIRIN 81 MG PO TBEC: 81.0000 mg | DELAYED_RELEASE_TABLET | Freq: Every day | ORAL | Status: DC

## 2018-06-20 NOTE — Progress Notes (Signed)
[facility]  ROUNDING NOTE   Subjective:  Patient completed hemodialysis yesterday. Resting in bed at the moment. Overall feeling better as compared to admission.  Objective:  Vital signs in last 24 hours:  Temp:  [97.5 F (36.4 C)-98.3 F (36.8 C)] 98.3 F (36.8 C) (12/05 0516) Pulse Rate:  [63-73] 70 (12/05 0929) Resp:  [12-23] 20 (12/05 0929) BP: (136-167)/(49-76) 149/58 (12/05 0929) SpO2:  [93 %-100 %] 95 % (12/05 0929) Weight:  [87.7 kg-89.9 kg] 89.9 kg (12/04 1601)  Weight change:  Filed Weights   06/19/18 1530 06/19/18 1545 06/19/18 1601  Weight: 87.7 kg 87.7 kg 89.9 kg    Intake/Output: I/O last 3 completed shifts: In: 200 [IV Piggyback:200] Out: 6294 [Other:1519]   Intake/Output this shift:  No intake/output data recorded.  Physical Exam: General: No acute distress  Head: Normocephalic, atraumatic. Moist oral mucosal membranes  Eyes: Anicteric  Neck: Supple, trachea midline  Lungs:  Basilar rales, normal effort  Heart: S1S2 no rubs  Abdomen:  Soft, nontender, bowel sounds present  Extremities: trace peripheral edema.  Neurologic: Awake, alert, following commands  Skin: No lesions  Access: LUE AV access    Basic Metabolic Panel: Recent Labs  Lab 06/16/18 1348 06/17/18 0557 06/19/18 0507 06/20/18 0631  NA 143  --  140 143  K 3.4*  --  3.6 3.6  CL 101  --  99 102  CO2 29  --  32 32  GLUCOSE 118*  --  85 92  BUN 46*  --  45* 26*  CREATININE 8.37*  --  8.31* 5.56*  CALCIUM 8.1*  --  7.7* 8.1*  PHOS  --  5.8*  --   --     Liver Function Tests: Recent Labs  Lab 06/16/18 1348  AST 26  ALT 16  ALKPHOS 67  BILITOT 1.3*  PROT 6.1*  ALBUMIN 3.3*   Recent Labs  Lab 06/16/18 1348  LIPASE 31   No results for input(s): AMMONIA in the last 168 hours.  CBC: Recent Labs  Lab 06/16/18 1348 06/19/18 0507 06/20/18 0631  WBC 15.8* 10.0 10.1  NEUTROABS 7.5 3.6  --   HGB 7.5* 7.1* 7.4*  HCT 23.0* 21.9* 23.8*  MCV 93.5 94.0  94.4  PLT 122* 96* 120*    Cardiac Enzymes: Recent Labs  Lab 06/16/18 1348 06/17/18 0557 06/17/18 1729  TROPONINI 0.15* 0.66* 0.40*    BNP: Invalid input(s): POCBNP  CBG: No results for input(s): GLUCAP in the last 168 hours.  Microbiology: Results for orders placed or performed during the hospital encounter of 05/16/18  MRSA PCR Screening     Status: None   Collection Time: 05/16/18  6:56 PM  Result Value Ref Range Status   MRSA by PCR NEGATIVE NEGATIVE Final    Comment:        The GeneXpert MRSA Assay (FDA approved for NASAL specimens only), is one component of a comprehensive MRSA colonization surveillance program. It is not intended to diagnose MRSA infection nor to guide or monitor treatment for MRSA infections. Performed at [facility], [address] [place], [place] [address]   CULTURE, BLOOD (ROUTINE X 2) w Reflex to ID Panel     Status: None   Collection Time: 05/17/18 11:12 PM  Result Value Ref Range Status   Specimen Description BLOOD RIGHT ANTECUBITAL  Final   Special Requests   Final    BOTTLES DRAWN AEROBIC AND ANAEROBIC Blood Culture adequate volume   Culture  Final    NO GROWTH 5 DAYS Performed at [facility], [address] [place], [place] [address]    Report Status 05/22/2018 FINAL  Final  CULTURE, BLOOD (ROUTINE X 2) w Reflex to ID Panel     Status: None   Collection Time: 05/17/18 11:19 PM  Result Value Ref Range Status   Specimen Description BLOOD RIGHT HAND  Final   Special Requests   Final    BOTTLES DRAWN AEROBIC AND ANAEROBIC Blood Culture results may not be optimal due to an excessive volume of blood received in culture bottles   Culture   Final    NO GROWTH 5 DAYS Performed at [facility], [address] [place], [place] [address]    Report Status 05/22/2018 FINAL  Final    Coagulation Studies: No results for input(s): LABPROT, INR in the last 72 hours.  Urinalysis: No results for  input(s): COLORURINE, LABSPEC, PHURINE, GLUCOSEU, HGBUR, BILIRUBINUR, KETONESUR, PROTEINUR, UROBILINOGEN, NITRITE, LEUKOCYTESUR in the last 72 hours.  Invalid input(s): APPERANCEUR    Imaging: Dg Chest 2 View  Result Date: 06/19/2018 CLINICAL DATA:  Shortness of breath EXAM: CHEST - 2 VIEW COMPARISON:  06/16/2018 FINDINGS: Moderate bilateral effusions. There is hyperinflation of the lungs compatible with COPD. Cardiomegaly, vascular congestion. Mild interstitial prominence may reflect interstitial edema, improved since prior study. Bibasilar atelectasis. IMPRESSION: Cardiomegaly, vascular congestion and suspected mild residual interstitial edema, improved since prior study. Moderate bilateral effusions with bibasilar atelectasis. Electronically Signed   By: [name] M.D.   On: 06/19/2018 19:46     Medications:   . ampicillin-sulbactam (UNASYN) IV Stopped (06/19/18 1907)   . amLODipine  10 mg Oral Daily  . aspirin EC  81 mg Oral Daily  . Chlorhexidine Gluconate Cloth  6 each Topical Q0600  . cholecalciferol  1,000 Units Oral Once per day on Mon Wed Fri  . cinacalcet  30 mg Oral Q breakfast  . epoetin (EPOGEN/PROCRIT) injection  4,000 Units Intravenous Q M,W,F-HD  . ferric citrate  210 mg Oral Daily  . heparin  5,000 Units Subcutaneous Q8H  . multivitamin  1 tablet Oral Daily   acetaminophen, albuterol, ondansetron **OR** ondansetron (ZOFRAN) IV  Assessment/ Plan:  77 y.o. male with end stage renal disease on hemodialysis,  hypertension, diabetes mellitus, GERD, anemia of CKD, SHPTH, colon cancer status post resection on 10/31 by Dr. [name], CLL followed by Dr. Janese Banks.   [facility] MWF L AVF 92kg  1.  ESRD on HD on MWF.  She completed hemodialysis yesterday.  No acute indication for dialysis today.  We will plan for dialysis again tomorrow.  2.  Anemia chronic kidney disease.  Hemoglobin up a bit to 7.4.  Continue Epogen 4000 units with dialysis.  3.  Secondary  hyperparathyroidism.  Maintain the patient on Auryxia as well as Sensipar.  Follow-up serum phosphorus tomorrow.  4.  Aspiration pneumonia.  Being treated with Unasyn.   LOS: 4 Jaina Morin 12/5/201911:14 AM

## 2018-06-20 NOTE — Progress Notes (Signed)
Physical Therapy Treatment Patient Details Name: Jonathan Mcgrath MRN: [id number] DOB: [birth year] Today's Date: 06/20/2018    History of Present Illness 77 year old male admitted for a fall at home and was noted to have acute respiratory failure, aspiration PNA, pulm edema, elevated troponin, low hgb.  He is referred for mobility assessment and note PMHx:  EF 55%, HD, CKD, DM, colon CA, HTN, cardiac enlargement, hemicolectomy with primary anastomosis 05/16/18    PT Comments    Pt agreeable to PT; reports feeling "weak and tired on the inside'. Denies pain. Pt eager to walk; prefers to trial rolling walker, as pt feels weak. Seated minimal exercise at edge of bed trial on room air; O2 saturation decreases to 87%. Improves quickly with 2L to 96/97%. Pt progressing ambulation distance with rolling walker to 100 ft; pt mildly unsteady requiring Min guard/A. Pt fatigues quickly. Received up in chair. Will call to get up (8 fall score). At the time of documentation, pt with current discharge to skilled nursing facility to continue rehab efforts.    Follow Up Recommendations  SNF     Equipment Recommendations  Rolling walker with 5" wheels    Recommendations for Other Services       Precautions / Restrictions Precautions Precautions: Fall Restrictions Weight Bearing Restrictions: No    Mobility  Bed Mobility Overal bed mobility: Modified Independent                Transfers Overall transfer level: Needs assistance Equipment used: Rolling walker (2 wheeled) Transfers: Sit to/from Stand Sit to Stand: Min guard         General transfer comment: Good use of hands; steady. Feels steadier with rw  Ambulation/Gait Ambulation/Gait assistance: Min guard;Min assist Gait Distance (Feet): 100 Feet Assistive device: Rolling walker (2 wheeled) Gait Pattern/deviations: Step-through pattern Gait velocity: decreased   General Gait Details: Mildly unsteady at times, more so when  making slight curves and turns with feet outside of rw parameter. Requires cues and assist to correct.    Stairs             Wheelchair Mobility    Modified Rankin (Stroke Patients Only)       Balance Overall balance assessment: Needs assistance;History of Falls Sitting-balance support: Feet supported;Bilateral upper extremity supported;No upper extremity supported Sitting balance-Leahy Scale: Good     Standing balance support: Bilateral upper extremity supported Standing balance-Leahy Scale: Fair                              Cognition Arousal/Alertness: Awake/alert Behavior During Therapy: WFL for tasks assessed/performed Overall Cognitive Status: Within Functional Limits for tasks assessed                                 General Comments: Pt reports feeling "weak and tired on the inside"      Exercises General Exercises - Lower Extremity Ankle Circles/Pumps: AROM;Both;10 reps;Seated Long Arc Quad: AROM;Both;10 reps;Seated Hip Flexion/Marching: AROM;Both;10 reps;Seated    General Comments        Pertinent Vitals/Pain Pain Assessment: No/denies pain    Home Living                      Prior Function            PT Goals (current goals can now be found in the care plan  section) Progress towards PT goals: Progressing toward goals    Frequency    Min 2X/week      PT Plan Current plan remains appropriate    Co-evaluation              AM-PAC PT "6 Clicks" Mobility   Outcome Measure  Help needed turning from your back to your side while in a flat bed without using bedrails?: None Help needed moving from lying on your back to sitting on the side of a flat bed without using bedrails?: None Help needed moving to and from a bed to a chair (including a wheelchair)?: A Little Help needed standing up from a chair using your arms (e.g., wheelchair or bedside chair)?: A Little Help needed to walk in hospital room?:  A Little Help needed climbing 3-5 steps with a railing? : A Lot 6 Click Score: 19    End of Session Equipment Utilized During Treatment: Gait belt;Oxygen Activity Tolerance: Patient tolerated treatment well Patient left: in chair;with call bell/phone within reach(8 fall score)   PT Visit Diagnosis: Unsteadiness on feet (R26.81);Muscle weakness (generalized) (M62.81);History of falling (Z91.81)     Time: 1518-3437 PT Time Calculation (min) (ACUTE ONLY): 32 min  Charges:  $Gait Training: 8-22 mins $Therapeutic Exercise: 8-22 mins                       Larae Grooms, PTA 06/20/2018, 11:55 AM

## 2018-06-20 NOTE — Plan of Care (Signed)
Bun / creat trending  down hgb 7.4 Problem: Education: Goal: Knowledge of General Education information will improve Description Including pain rating scale, medication(s)/side effects and non-pharmacologic comfort measures Outcome: Progressing   Problem: Health Behavior/Discharge Planning: Goal: Ability to manage health-related needs will improve Outcome: Progressing   Problem: Clinical Measurements: Goal: Ability to maintain clinical measurements within normal limits will improve Outcome: Progressing Goal: Will remain free from infection Outcome: Progressing Goal: Diagnostic test results will improve Outcome: Progressing Goal: Respiratory complications will improve Outcome: Progressing Goal: Cardiovascular complication will be avoided Outcome: Progressing   Problem: Activity: Goal: Risk for activity intolerance will decrease Outcome: Progressing   Problem: Nutrition: Goal: Adequate nutrition will be maintained Outcome: Progressing   Problem: Coping: Goal: Level of anxiety will decrease Outcome: Progressing   Problem: Elimination: Goal: Will not experience complications related to bowel motility Outcome: Progressing Goal: Will not experience complications related to urinary retention Outcome: Progressing   Problem: Pain Managment: Goal: General experience of comfort will improve Outcome: Progressing   Problem: Safety: Goal: Ability to remain free from injury will improve Outcome: Progressing   Problem: Skin Integrity: Goal: Risk for impaired skin integrity will decrease Outcome: Progressing

## 2018-06-20 NOTE — Clinical Social Work Note (Signed)
Patient is medically ready for discharge today. CSW notified patient and daughter Jonathan Mcgrath [phone] of discharge to [facility] today. Both are in agreement with discharge today. CSW notified Otila Kluver at [employer] of discharge today. Otila Kluver states that they have received authorization. Patient will be transported by EMS. RN to call report and call for transport.   [place], [place]

## 2018-06-20 NOTE — Plan of Care (Signed)
  Problem: Education: Goal: Knowledge of General Education information will improve Description Including pain rating scale, medication(s)/side effects and non-pharmacologic comfort measures 06/20/2018 1104 by Rowe Robert, RN Outcome: Progressing 06/20/2018 0752 by Rowe Robert, RN Outcome: Progressing   Problem: Health Behavior/Discharge Planning: Goal: Ability to manage health-related needs will improve 06/20/2018 1104 by Rowe Robert, RN Outcome: Progressing 06/20/2018 0752 by Rowe Robert, RN Outcome: Progressing   Problem: Clinical Measurements: Goal: Ability to maintain clinical measurements within normal limits will improve 06/20/2018 1104 by Rowe Robert, RN Outcome: Progressing 06/20/2018 0752 by Rowe Robert, RN Outcome: Progressing Goal: Will remain free from infection 06/20/2018 1104 by Rowe Robert, RN Outcome: Progressing 06/20/2018 0752 by Rowe Robert, RN Outcome: Progressing Goal: Diagnostic test results will improve 06/20/2018 1104 by Rowe Robert, RN Outcome: Progressing 06/20/2018 0752 by Rowe Robert, RN Outcome: Progressing Goal: Respiratory complications will improve 06/20/2018 1104 by Rowe Robert, RN Outcome: Progressing 06/20/2018 0752 by Rowe Robert, RN Outcome: Progressing Goal: Cardiovascular complication will be avoided 06/20/2018 1104 by Rowe Robert, RN Outcome: Progressing 06/20/2018 0752 by Rowe Robert, RN Outcome: Progressing   Problem: Activity: Goal: Risk for activity intolerance will decrease 06/20/2018 1104 by Rowe Robert, RN Outcome: Progressing 06/20/2018 0752 by Rowe Robert, RN Outcome: Progressing   Problem: Nutrition: Goal: Adequate nutrition will be maintained 06/20/2018 1104 by Rowe Robert, RN Outcome: Progressing 06/20/2018 0752 by Rowe Robert, RN Outcome: Progressing   Problem: Coping: Goal: Level of anxiety will decrease 06/20/2018 1104 by Rowe Robert, RN Outcome:  Progressing 06/20/2018 0752 by Rowe Robert, RN Outcome: Progressing   Problem: Elimination: Goal: Will not experience complications related to bowel motility 06/20/2018 1104 by Rowe Robert, RN Outcome: Progressing 06/20/2018 0752 by Rowe Robert, RN Outcome: Progressing Goal: Will not experience complications related to urinary retention 06/20/2018 1104 by Rowe Robert, RN Outcome: Progressing 06/20/2018 0752 by Rowe Robert, RN Outcome: Progressing   Problem: Pain Managment: Goal: General experience of comfort will improve 06/20/2018 1104 by Rowe Robert, RN Outcome: Progressing 06/20/2018 0752 by Rowe Robert, RN Outcome: Progressing   Problem: Safety: Goal: Ability to remain free from injury will improve 06/20/2018 1104 by Rowe Robert, RN Outcome: Progressing 06/20/2018 0752 by Rowe Robert, RN Outcome: Progressing   Problem: Skin Integrity: Goal: Risk for impaired skin integrity will decrease 06/20/2018 1104 by Rowe Robert, RN Outcome: Progressing 06/20/2018 0752 by Rowe Robert, RN Outcome: Progressing

## 2018-06-20 NOTE — Care Management Important Message (Signed)
Important Message  Patient Details  Name: Jonathan Mcgrath MRN: [id number] Date of Birth: 1941/06/21   Medicare Important Message Given:  Yes    Jonathan Mcgrath 06/20/2018, 10:57 AM

## 2018-06-20 NOTE — Discharge Summary (Addendum)
[place] at [facility], 77 y.o., DOB 03/13/1941, MRN [id number]. Admission date: 06/16/2018 Discharge Date 06/20/2018 [facility] Admitting Physician Fritzi Mandes, MD  Admission Diagnosis  Acute respiratory failure with hypoxia (HCC) [J96.01] Nausea vomiting and diarrhea [R11.2, R19.7]  Discharge Diagnosis   Active Problems: Acute hypoxic respiratory failure secondary to combination nation of pulmonary edema and aspiration pneumonia Aspiration pneumonia Elevated troponin due to demand End-stage renal disease History of invasive adenocarcinoma the cecum Anemia of chronic disease   Hospital Course [place]62 y.o.malewith a known history of end-stage renal disease on hemodialysis, diabetes not on any home medicines comes to the emergency room after he started having nausea and vomiting since yesterday feeling very weak and deconditioned. Patient felt weak  to the point he is knees gave out he went down to the floor without any injury. Son got him to the side of the bed and called EMS found sats 60% on room air.  Patient was seen in the emergency room and admitted for further evaluation.  Was thought to have a combination of aspiration pneumonia and pulmonary edema.  Patient was seen by nephrology and hemodialyzed as well as he was treated with antibiotics.  He is continued to require oxygen 2 L which will he will need to be weaned off over time frame.     Oxygen 2 L at all time Augmentin x4 days         Consults  nephrology  Significant Tests:  See full reports for all details     Dg Chest 2 View  Result Date: 06/19/2018 CLINICAL DATA:  Shortness of breath EXAM: CHEST - 2 VIEW COMPARISON:  06/16/2018 FINDINGS: Moderate bilateral effusions. There is hyperinflation of the lungs compatible with COPD. Cardiomegaly, vascular congestion. Mild interstitial prominence may reflect interstitial  edema, improved since prior study. Bibasilar atelectasis. IMPRESSION: Cardiomegaly, vascular congestion and suspected mild residual interstitial edema, improved since prior study. Moderate bilateral effusions with bibasilar atelectasis. Electronically Signed   By: [name] M.D.   On: 06/19/2018 19:46   Dg Chest Portable 1 View  Result Date: 06/16/2018 CLINICAL DATA:  Nausea vomiting and diarrhea. EXAM: PORTABLE CHEST 1 VIEW COMPARISON:  04/18/2018 FINDINGS: There is a right IJ catheter with tip projecting over the cavoatrial junction. The heart size is mildly enlarged. There is mild diffuse pulmonary edema. No airspace consolidation identified. IMPRESSION: 1. Mild cardiac enlargement and pulmonary edema. Electronically Signed   By: Kerby Moors M.D.   On: 06/16/2018 14:26       Today   Subjective:   Jonathan Mcgrath patient doing better shortness of breath improved Objective:   Blood pressure (!) 149/58, pulse 70, temperature 98.3 F (36.8 C), temperature source Oral, resp. rate 20, height 6\' 1"  (1.854 m), weight 89.9 kg, SpO2 95 %.  .  Intake/Output Summary (Last 24 hours) at 06/20/2018 1009 Last data filed at 06/20/2018 0625 Gross per 24 hour  Intake 200 ml  Output 1519 ml  Net -1319 ml    Exam VITAL SIGNS: Blood pressure (!) 149/58, pulse 70, temperature 98.3 F (36.8 C), temperature source Oral, resp. rate 20, height 6\' 1"  (1.854 m), weight 89.9 kg, SpO2 95 %.  GENERAL:  77 y.o.-year-old patient lying in the bed with no acute distress.  EYES: Pupils equal, round, reactive to light and accommodation. No scleral icterus. Extraocular muscles intact.  HEENT: Head atraumatic, normocephalic. Oropharynx and nasopharynx clear.  NECK:  Supple,  no jugular venous distention. No thyroid enlargement, no tenderness.  LUNGS: Normal breath sounds bilaterally, no wheezing, rales,rhonchi or crepitation. No use of accessory muscles of respiration.  CARDIOVASCULAR: S1, S2 normal. No murmurs,  rubs, or gallops.  ABDOMEN: Soft, nontender, nondistended. Bowel sounds present. No organomegaly or mass.  EXTREMITIES: No pedal edema, cyanosis, or clubbing.  NEUROLOGIC: Cranial nerves II through XII are intact. Muscle strength 5/5 in all extremities. Sensation intact. Gait not checked.  PSYCHIATRIC: The patient is alert and oriented x 3.  SKIN: No obvious rash, lesion, or ulcer.   Data Review     CBC w Diff:  Lab Results  Component Value Date   WBC 10.1 06/20/2018   HGB 7.4 (L) 06/20/2018   HGB 10.6 (L) 04/12/2014   HCT 23.8 (L) 06/20/2018   HCT 33.5 (L) 04/12/2014   PLT 120 (L) 06/20/2018   PLT 123 (L) 04/12/2014   LYMPHOPCT 53 06/19/2018   LYMPHOPCT 63.9 04/11/2014   MONOPCT 9 06/19/2018   MONOPCT 4 04/12/2014   MONOPCT 6.3 04/11/2014   EOSPCT 2 06/19/2018   EOSPCT 0.8 04/11/2014   BASOPCT 0 06/19/2018   BASOPCT 0.8 04/11/2014   CMP:  Lab Results  Component Value Date   NA 143 06/20/2018   NA 140 04/13/2014   K 3.6 06/20/2018   K 4.6 04/13/2014   CL 102 06/20/2018   CL 109 (H) 04/13/2014   CO2 32 06/20/2018   CO2 24 04/13/2014   BUN 26 (H) 06/20/2018   BUN 79 (H) 04/13/2014   CREATININE 5.56 (H) 06/20/2018   CREATININE 9.83 (H) 04/13/2014   PROT 6.1 (L) 06/16/2018   PROT 6.4 04/11/2014   ALBUMIN 3.3 (L) 06/16/2018   ALBUMIN 3.2 (L) 04/11/2014   BILITOT 1.3 (H) 06/16/2018   BILITOT 0.6 04/11/2014   ALKPHOS 67 06/16/2018   ALKPHOS 91 04/11/2014   AST 26 06/16/2018   AST 39 (H) 04/11/2014   ALT 16 06/16/2018   ALT 50 04/11/2014  .  Micro Results No results found for this or any previous visit (from the past 240 hour(s)).      Code Status Orders  (From admission, onward)         Start     Ordered   06/16/18 1650  Do not attempt resuscitation (DNR)  Continuous    Question Answer Comment  In the event of cardiac or respiratory ARREST Do not call a "code blue"   In the event of cardiac or respiratory ARREST Do not perform Intubation, CPR,  defibrillation or ACLS   In the event of cardiac or respiratory ARREST Use medication by any route, position, wound care, and other measures to relive pain and suffering. May use oxygen, suction and manual treatment of airway obstruction as needed for comfort.      06/16/18 1649        Code Status History    Date Active Date Inactive Code Status Order ID Comments User Context   05/16/2018 1845 05/20/2018 2324 DNR [id number]  Benjamine Sprague, DO Inpatient   04/12/2018 1310 04/20/2018 1541 DNR [id number]  Dustin Flock, MD Inpatient   04/12/2018 0827 04/12/2018 1310 DNR [id number]  Dustin Flock, MD ED   08/17/2016 0352 08/18/2016 2236 Full Code [id number]  Saundra Shelling, MD Inpatient   01/12/2016 1417 01/12/2016 1754 Full Code [id number]  Algernon Huxley, MD Inpatient    Advance Directive Documentation     Most Recent Value  Type of Advance Directive  Healthcare Power of [place]  Pre-existing out of facility DNR order (yellow form or pink MOST form)  -  "MOST" Form in Place?  -          Contact information for after-discharge care    Destination    [facility] Preferred SNF .   Service:  Skilled Nursing Contact information: [address] [place] [phone]              Discharge Medications   Allergies as of 06/20/2018   No Known Allergies     Medication List    STOP taking these medications   acetaminophen 325 MG tablet Commonly known as:  TYLENOL     TAKE these medications   amLODipine 5 MG tablet Commonly known as:  NORVASC Take 1 tablet (5 mg total) by mouth daily. What changed:  how much to take   amoxicillin-clavulanate 875-125 MG tablet Commonly known as:  AUGMENTIN Take 1 tablet by mouth 2 (two) times daily for 7 days.   aspirin 81 MG EC tablet Take 1 tablet (81 mg total) by mouth daily. Start taking on:  06/21/2018   carvedilol 6.25 MG tablet Commonly known as:  COREG Take 6.25 mg by mouth 2 (two) times daily  with a meal.   cinacalcet 30 MG tablet Commonly known as:  SENSIPAR Take 30 mg by mouth every morning.   ferric citrate 1 GM 210 MG(Fe) tablet Commonly known as:  AURYXIA Take 210 mg by mouth daily.   Menthol (Topical Analgesic) 16 % Crea Apply 1 application topically daily as needed (neuropathy related leg pain).   multivitamin Tabs tablet Take 1 tablet by mouth daily.   ondansetron 4 MG tablet Commonly known as:  ZOFRAN Take 4 mg by mouth 3 (three) times daily as needed for nausea/vomiting.   VITAMIN D PO Take 2 capsules by mouth 3 (three) times a week. Given during dialysis          Total Time in preparing paper work, data evaluation and todays exam - 100 minutes  Dustin Flock M.D on 06/20/2018 at 10:09 AM [place]  [phone]

## 2018-07-08 ENCOUNTER — Emergency Department: Payer: Medicare HMO

## 2018-07-08 ENCOUNTER — Other Ambulatory Visit: Payer: Self-pay

## 2018-07-08 ENCOUNTER — Inpatient Hospital Stay
Admission: EM | Admit: 2018-07-08 | Discharge: 2018-07-18 | DRG: 515 | Disposition: A | Discharge status: Home Health | Payer: Medicare HMO | Attending: Internal Medicine | Admitting: Internal Medicine

## 2018-07-08 DIAGNOSIS — R1084 Generalized abdominal pain: Secondary | ICD-10-CM

## 2018-07-08 DIAGNOSIS — Z7982 Long term (current) use of aspirin: Secondary | ICD-10-CM | POA: Diagnosis not present

## 2018-07-08 DIAGNOSIS — Z9049 Acquired absence of other specified parts of digestive tract: Secondary | ICD-10-CM

## 2018-07-08 DIAGNOSIS — E1165 Type 2 diabetes mellitus with hyperglycemia: Secondary | ICD-10-CM | POA: Diagnosis present

## 2018-07-08 DIAGNOSIS — I132 Hypertensive heart and chronic kidney disease with heart failure and with stage 5 chronic kidney disease, or end stage renal disease: Secondary | ICD-10-CM | POA: Diagnosis present

## 2018-07-08 DIAGNOSIS — N2581 Secondary hyperparathyroidism of renal origin: Secondary | ICD-10-CM | POA: Diagnosis present

## 2018-07-08 DIAGNOSIS — Z961 Presence of intraocular lens: Secondary | ICD-10-CM | POA: Diagnosis present

## 2018-07-08 DIAGNOSIS — E1122 Type 2 diabetes mellitus with diabetic chronic kidney disease: Secondary | ICD-10-CM | POA: Diagnosis present

## 2018-07-08 DIAGNOSIS — Z9842 Cataract extraction status, left eye: Secondary | ICD-10-CM | POA: Diagnosis not present

## 2018-07-08 DIAGNOSIS — Z79899 Other long term (current) drug therapy: Secondary | ICD-10-CM | POA: Diagnosis not present

## 2018-07-08 DIAGNOSIS — D631 Anemia in chronic kidney disease: Secondary | ICD-10-CM | POA: Diagnosis present

## 2018-07-08 DIAGNOSIS — I120 Hypertensive chronic kidney disease with stage 5 chronic kidney disease or end stage renal disease: Secondary | ICD-10-CM | POA: Diagnosis not present

## 2018-07-08 DIAGNOSIS — A480 Gas gangrene: Secondary | ICD-10-CM | POA: Diagnosis present

## 2018-07-08 DIAGNOSIS — Z833 Family history of diabetes mellitus: Secondary | ICD-10-CM

## 2018-07-08 DIAGNOSIS — N186 End stage renal disease: Secondary | ICD-10-CM

## 2018-07-08 DIAGNOSIS — X58XXXA Exposure to other specified factors, initial encounter: Secondary | ICD-10-CM | POA: Diagnosis not present

## 2018-07-08 DIAGNOSIS — G839 Paralytic syndrome, unspecified: Secondary | ICD-10-CM | POA: Diagnosis not present

## 2018-07-08 DIAGNOSIS — I5033 Acute on chronic diastolic (congestive) heart failure: Secondary | ICD-10-CM | POA: Diagnosis present

## 2018-07-08 DIAGNOSIS — Z85030 Personal history of malignant carcinoid tumor of large intestine: Secondary | ICD-10-CM | POA: Diagnosis not present

## 2018-07-08 DIAGNOSIS — Z8249 Family history of ischemic heart disease and other diseases of the circulatory system: Secondary | ICD-10-CM

## 2018-07-08 DIAGNOSIS — C180 Malignant neoplasm of cecum: Secondary | ICD-10-CM | POA: Diagnosis not present

## 2018-07-08 DIAGNOSIS — I776 Arteritis, unspecified: Secondary | ICD-10-CM | POA: Diagnosis present

## 2018-07-08 DIAGNOSIS — C189 Malignant neoplasm of colon, unspecified: Secondary | ICD-10-CM | POA: Diagnosis present

## 2018-07-08 DIAGNOSIS — J9601 Acute respiratory failure with hypoxia: Secondary | ICD-10-CM | POA: Diagnosis present

## 2018-07-08 DIAGNOSIS — Z992 Dependence on renal dialysis: Secondary | ICD-10-CM | POA: Diagnosis not present

## 2018-07-08 DIAGNOSIS — Z8619 Personal history of other infectious and parasitic diseases: Secondary | ICD-10-CM | POA: Diagnosis not present

## 2018-07-08 DIAGNOSIS — D72829 Elevated white blood cell count, unspecified: Secondary | ICD-10-CM | POA: Diagnosis not present

## 2018-07-08 DIAGNOSIS — I723 Aneurysm of iliac artery: Secondary | ICD-10-CM | POA: Diagnosis present

## 2018-07-08 DIAGNOSIS — Z9841 Cataract extraction status, right eye: Secondary | ICD-10-CM

## 2018-07-08 DIAGNOSIS — T380X5A Adverse effect of glucocorticoids and synthetic analogues, initial encounter: Secondary | ICD-10-CM | POA: Diagnosis not present

## 2018-07-08 DIAGNOSIS — Z95820 Peripheral vascular angioplasty status with implants and grafts: Secondary | ICD-10-CM | POA: Diagnosis not present

## 2018-07-08 LAB — CBC WITH DIFFERENTIAL/PLATELET
Abs Immature Granulocytes: 0.07 10*3/uL (ref 0.00–0.07)
Basophils Absolute: 0.1 10*3/uL (ref 0.0–0.1)
Basophils Relative: 0 %
Eosinophils Absolute: 0 10*3/uL (ref 0.0–0.5)
Eosinophils Relative: 0 %
HCT: 26.7 % — ABNORMAL LOW (ref 39.0–52.0)
Hemoglobin: 8.3 g/dL — ABNORMAL LOW (ref 13.0–17.0)
Immature Granulocytes: 0 %
Lymphocytes Relative: 38 %
Lymphs Abs: 5.9 10*3/uL — ABNORMAL HIGH (ref 0.7–4.0)
MCH: 29.6 pg (ref 26.0–34.0)
MCHC: 31.1 g/dL (ref 30.0–36.0)
MCV: 95.4 fL (ref 80.0–100.0)
Monocytes Absolute: 1 10*3/uL (ref 0.1–1.0)
Monocytes Relative: 6 %
Neutro Abs: 8.7 10*3/uL — ABNORMAL HIGH (ref 1.7–7.7)
Neutrophils Relative %: 56 %
Platelets: 148 10*3/uL — ABNORMAL LOW (ref 150–400)
RBC: 2.8 MIL/uL — ABNORMAL LOW (ref 4.22–5.81)
RDW: 18.8 % — ABNORMAL HIGH (ref 11.5–15.5)
WBC: 15.4 10*3/uL — ABNORMAL HIGH (ref 4.0–10.5)
nRBC: 0 % (ref 0.0–0.2)

## 2018-07-08 LAB — COMPREHENSIVE METABOLIC PANEL
ALT: 19 U/L (ref 0–44)
AST: 23 U/L (ref 15–41)
Albumin: 3.4 g/dL — ABNORMAL LOW (ref 3.5–5.0)
Alkaline Phosphatase: 95 U/L (ref 38–126)
Anion gap: 10 (ref 5–15)
BUN: 28 mg/dL — ABNORMAL HIGH (ref 8–23)
CO2: 29 mmol/L (ref 22–32)
Calcium: 8.3 mg/dL — ABNORMAL LOW (ref 8.9–10.3)
Chloride: 100 mmol/L (ref 98–111)
Creatinine, Ser: 5.41 mg/dL — ABNORMAL HIGH (ref 0.61–1.24)
GFR calc Af Amer: 11 mL/min — ABNORMAL LOW (ref >60)
GFR calc non Af Amer: 9 mL/min — ABNORMAL LOW (ref >60)
Glucose, Bld: 123 mg/dL — ABNORMAL HIGH (ref 70–99)
Potassium: 3.5 mmol/L (ref 3.5–5.1)
Sodium: 139 mmol/L (ref 135–145)
Total Bilirubin: 1.2 mg/dL (ref 0.3–1.2)
Total Protein: 6.2 g/dL — ABNORMAL LOW (ref 6.5–8.1)

## 2018-07-08 LAB — INFLUENZA PANEL BY PCR (TYPE A & B)
Influenza A By PCR: NEGATIVE
Influenza B By PCR: NEGATIVE

## 2018-07-08 LAB — LIPASE, BLOOD: Lipase: 29 U/L (ref 11–51)

## 2018-07-08 MED ORDER — PIPERACILLIN-TAZOBACTAM 3.375 G IVPB
3.3750 g | Freq: Two times a day (BID) | INTRAVENOUS | Status: DC
  Administered 2018-07-08 – 2018-07-10 (×4): 3.375 g via INTRAVENOUS
  Filled 2018-07-08 (×4): qty 50

## 2018-07-08 MED ORDER — VANCOMYCIN HCL IN DEXTROSE 1-5 GM/200ML-% IV SOLN: 1000.0000 mg | INTRAVENOUS | Status: DC

## 2018-07-08 MED ORDER — HYDRALAZINE HCL 20 MG/ML IJ SOLN
5.0000 mg | Freq: Once | INTRAMUSCULAR | Status: AC
  Administered 2018-07-08: 5 mg via INTRAVENOUS

## 2018-07-08 MED ORDER — AMLODIPINE BESYLATE 5 MG PO TABS
10.0000 mg | ORAL_TABLET | Freq: Once | ORAL | Status: AC
  Administered 2018-07-08: 10 mg via ORAL
  Filled 2018-07-08: qty 2

## 2018-07-08 MED ORDER — IOPAMIDOL (ISOVUE-300) INJECTION 61%
100.0000 mL | Freq: Once | INTRAVENOUS | Status: AC | PRN
  Administered 2018-07-08: 100 mL via INTRAVENOUS

## 2018-07-08 MED ORDER — VANCOMYCIN HCL 10 G IV SOLR
2000.0000 mg | Freq: Once | INTRAVENOUS | Status: AC
  Administered 2018-07-08: 2000 mg via INTRAVENOUS
  Filled 2018-07-08: qty 2000

## 2018-07-08 MED ORDER — LABETALOL HCL 5 MG/ML IV SOLN: 10.0000 mg | Freq: Once | INTRAVENOUS | Status: DC | PRN

## 2018-07-08 MED ORDER — HYDRALAZINE HCL 20 MG/ML IJ SOLN
5.0000 mg | INTRAMUSCULAR | Status: DC | PRN
  Administered 2018-07-08 – 2018-07-12 (×3): 5 mg via INTRAVENOUS
  Filled 2018-07-08 (×4): qty 1

## 2018-07-08 MED ORDER — ONDANSETRON HCL 4 MG/2ML IJ SOLN
4.0000 mg | Freq: Once | INTRAMUSCULAR | Status: AC
  Administered 2018-07-08: 4 mg via INTRAVENOUS
  Filled 2018-07-08: qty 2

## 2018-07-08 MED ORDER — HYDRALAZINE HCL 20 MG/ML IJ SOLN
10.0000 mg | Freq: Once | INTRAMUSCULAR | Status: AC
  Administered 2018-07-08: 10 mg via INTRAVENOUS
  Filled 2018-07-08: qty 1

## 2018-07-08 NOTE — ED Notes (Signed)
Pt placed on 4L at this time

## 2018-07-08 NOTE — ED Notes (Signed)
Per MD [facility], okay to get one Blood Culture with straight stick and then start IV antibiotics.

## 2018-07-08 NOTE — ED Notes (Addendum)
Pharmacy called about getting Hydralzine to give for pt BP. Pharmacy validating asap.

## 2018-07-08 NOTE — H&P (Signed)
[facility] [place] NAME: Jonathan Mcgrath    MR#:  [id number]  DATE OF BIRTH:  03-09-1941  DATE OF ADMISSION:  07/08/2018  PRIMARY CARE PHYSICIAN: [facility] [place]   REQUESTING/REFERRING PHYSICIAN: Carrie Mew, MD  CHIEF COMPLAINT:   Chief Complaint  Patient presents with  . Weakness  . Flank Pain    HISTORY OF PRESENT ILLNESS:  Jonathan Mcgrath  is a 77 y.o. male with a known history of colon cancer s/p right hemicolectomy 05/16/18, HTN, type 2 diabetes, ESRD on HD who presented to the ED with abdominal pain for the last couple of days. The abdominal pain is located across his lower abdomen and is "dull" in quality.  He states he has a difficult time describing the pain.  The pain does not radiate.  He also endorses nausea, vomiting, and generalized weakness.   In the ED, he was hypertensive with blood pressures in the 180s/70s and tachypneic with RR 23-25.  Labs are significant for WBC 15.4, hemoglobin 8.3.  Chest x-ray with mild pulmonary edema.  CT abdomen pelvis with worsening appearance of the distal aorta, aortic bifurcation and left iliac region, likely due to acute arteritis (possibly infectious) or to a contained perforation with persistent leakage with questionable abscess.  Hospitalists were called for admission.  PAST MEDICAL HISTORY:   Past Medical History:  Diagnosis Date  . Cancer ([place]) 2019   colon  . Chronic kidney disease   . Diabetes mellitus without complication (HCC)    diet controlled  . Hemodialysis access site with arteriovenous graft ([place])   . Hypertension   . Renal insufficiency    Pt on dialysis x 3 years and receives every Monday, Wednesday and Friday.     PAST SURGICAL HISTORY:   Past Surgical History:  Procedure Laterality Date  . AV FISTULA PLACEMENT Left 2015   arm  . CATARACT EXTRACTION W/ INTRAOCULAR LENS  IMPLANT, BILATERAL Bilateral 08/2017  . CENTRAL LINE  INSERTION-TUNNELED N/A 04/19/2018   Procedure: CENTRAL LINE INSERTION-TUNNELED;  Surgeon: Delana Meyer Dolores Lory, MD;  Location: [place] [facility];  Service: Cardiovascular;  Laterality: N/A;  . COLON RESECTION N/A 05/16/2018   Procedure: LAPAROSCOPIC  COLON RESECTION;  Surgeon: Herbert Pun, MD;  Location: [facility];  Service: General;  Laterality: N/A;  . COLONOSCOPY N/A 04/18/2018   Procedure: COLONOSCOPY;  Surgeon: Virgel Manifold, MD;  Location: [facility];  Service: Endoscopy;  Laterality: N/A;  . DIALYSIS/PERMA CATHETER REMOVAL N/A 06/19/2018   Procedure: DIALYSIS/PERMA CATHETER REMOVAL;  Surgeon: Algernon Huxley, MD;  Location: [place] [facility];  Service: Cardiovascular;  Laterality: N/A;  . ESOPHAGOGASTRODUODENOSCOPY N/A 04/18/2018   Procedure: ESOPHAGOGASTRODUODENOSCOPY (EGD);  Surgeon: Virgel Manifold, MD;  Location: [facility];  Service: Endoscopy;  Laterality: N/A;  . EYE SURGERY    . PERIPHERAL VASCULAR CATHETERIZATION Left 01/12/2016   Procedure: A/V Shuntogram/Fistulagram;  Surgeon: Algernon Huxley, MD;  Location: [place] [facility];  Service: Cardiovascular;  Laterality: Left;  . PERIPHERAL VASCULAR CATHETERIZATION N/A 01/12/2016   Procedure: A/V Shunt Intervention;  Surgeon: Algernon Huxley, MD;  Location: [place] [facility];  Service: Cardiovascular;  Laterality: N/A;  . PERITONEAL CATHETER INSERTION N/A   . REMOVAL OF A DIALYSIS CATHETER N/A 01/14/2015   Procedure: REMOVAL OF A  PERITONEAL DIALYSIS CATHETER;  Surgeon: Algernon Huxley, MD;  Location: [facility];  Service: Vascular;  Laterality: N/A;  . TEE WITHOUT CARDIOVERSION N/A 04/19/2018  Procedure: TRANSESOPHAGEAL ECHOCARDIOGRAM (TEE);  Surgeon: Teodoro Spray, MD;  Location: [facility];  Service: Cardiovascular;  Laterality: N/A;    SOCIAL HISTORY:   Social History   Tobacco Use  . Smoking status: Never Smoker  . Smokeless tobacco: Never Used  Substance Use Topics  . Alcohol use: No    FAMILY  HISTORY:   Family History  Problem Relation Age of Onset  . Diabetes Mother   . Heart disease Father     DRUG ALLERGIES:  No Known Allergies  REVIEW OF SYSTEMS:   Review of Systems  Constitutional: Positive for malaise/fatigue. Negative for chills and fever.  HENT: Negative for congestion and sore throat.   Eyes: Negative for blurred vision and double vision.  Respiratory: Negative for cough and shortness of breath.   Cardiovascular: Negative for chest pain, palpitations and leg swelling.  Gastrointestinal: Positive for abdominal pain, nausea and vomiting. Negative for diarrhea.  Genitourinary: Negative for dysuria and urgency.  Musculoskeletal: Negative for back pain and neck pain.  Neurological: Positive for weakness. Negative for dizziness, focal weakness and headaches.  Psychiatric/Behavioral: Negative for depression. The patient is not nervous/anxious.     MEDICATIONS AT HOME:   Prior to Admission medications   Medication Sig Start Date End Date Taking? Authorizing Provider  amLODipine (NORVASC) 5 MG tablet Take 1 tablet (5 mg total) by mouth daily. Patient taking differently: Take 10 mg by mouth daily.  04/21/18  Yes Dustin Flock, MD  aspirin EC 81 MG EC tablet Take 1 tablet (81 mg total) by mouth daily. 06/21/18  Yes Dustin Flock, MD  carvedilol (COREG) 6.25 MG tablet Take 6.25 mg by mouth 2 (two) times daily with a meal.   Yes [provider]  cinacalcet (SENSIPAR) 30 MG tablet Take 30 mg by mouth every morning.   Yes [provider]  ferric citrate (AURYXIA) 1 GM 210 MG(Fe) tablet Take 210 mg by mouth daily.    Yes [provider]  multivitamin (RENA-VIT) TABS tablet Take 1 tablet by mouth daily. 10/12/15  Yes [provider]  ondansetron (ZOFRAN) 4 MG tablet Take 4 mg by mouth 3 (three) times daily as needed for nausea/vomiting. 06/07/18  Yes [provider]      VITAL SIGNS:  Blood pressure (!) 194/78, pulse 78,  temperature 97.7 F (36.5 C), temperature source Oral, resp. rate (!) 31, height 6\' 1"  (1.854 m), weight 89.9 kg, SpO2 98 %.  PHYSICAL EXAMINATION:  Physical Exam  GENERAL:  77 y.o.-year-old patient lying in the bed with no acute distress.  EYES: Pupils equal, round, reactive to light and accommodation. No scleral icterus. Extraocular muscles intact.  HEENT: Head atraumatic, normocephalic. Oropharynx and nasopharynx clear.  Moist mucous membranes. NECK:  Supple, no jugular venous distention. No thyroid enlargement, no tenderness.  LUNGS: Normal breath sounds bilaterally, no wheezing, rales,rhonchi or crepitation. No use of accessory muscles of respiration.  CARDIOVASCULAR: RRR, S1, S2 normal. No murmurs, rubs, or gallops.  ABDOMEN: Soft, nondistended. Bowel sounds present. No organomegaly or mass. + Mild generalized tenderness to palpation across lower abdomen.  No rebound or guarding. EXTREMITIES: Trace pedal edema, cyanosis, or clubbing.  NEUROLOGIC: Cranial nerves II through XII are intact. Muscle strength 5/5 in all extremities. Sensation intact. Gait not checked.  PSYCHIATRIC: The patient is alert and oriented x 3.  SKIN: No obvious rash, lesion, or ulcer.   LABORATORY PANEL:   CBC Recent Labs  Lab 07/08/18 1227  WBC 15.4*  HGB 8.3*  HCT  26.7*  PLT 148*   ------------------------------------------------------------------------------------------------------------------  Chemistries  Recent Labs  Lab 07/08/18 1227  NA 139  K 3.5  CL 100  CO2 29  GLUCOSE 123*  BUN 28*  CREATININE 5.41*  CALCIUM 8.3*  AST 23  ALT 19  ALKPHOS 95  BILITOT 1.2   ------------------------------------------------------------------------------------------------------------------  Cardiac Enzymes No results for input(s): TROPONINI in the last 168 hours. ------------------------------------------------------------------------------------------------------------------  RADIOLOGY:  Ct  Chest W Contrast  Result Date: 07/08/2018 CLINICAL DATA:  Dialysis patient. Weakness and dizziness. Chest and abdominal pain. EXAM: CT CHEST, ABDOMEN, AND PELVIS WITH CONTRAST TECHNIQUE: Multidetector CT imaging of the chest, abdomen and pelvis was performed following the standard protocol during bolus administration of intravenous contrast. CONTRAST:  134mL ISOVUE-300 IOPAMIDOL (ISOVUE-300) INJECTION 61% COMPARISON:  Radiography same day. Chest CT 04/18/2018. abdominal CT 05/14/2018 FINDINGS: CT CHEST FINDINGS Cardiovascular: Cardiomegaly. Small pericardial effusion. Coronary artery calcification. Aortic valve calcification. Thoracic aortic atherosclerotic calcification. No aneurysm or dissection. Mediastinum/Nodes: No mass or adenopathy. Lungs/Pleura: Bilateral effusions layering dependently with dependent pulmonary atelectasis. Interstitial prominence likely reflecting interstitial edema. No non dependent pathology otherwise. Musculoskeletal: No acute thoracic or rib finding. Ordinary spinal degenerative change. CT ABDOMEN PELVIS FINDINGS Hepatobiliary: Liver parenchyma appears normal. No evidence of cholecystitis. Pancreas: Normal Spleen: Normal Adrenals/Urinary Tract: Adrenal glands are normal. Kidneys are poorly functioning. No evidence of obstruction or mass. Bladder shows wall thickening. Stomach/Bowel: Interval colectomy on the right. No complicating feature seen relative to that. No sign of bowel obstruction. Vascular/Lymphatic: Worsening of the appearance at the distal aorta, aortic bifurcation and left iliac region, with more surrounding extravascular density. Actual low-density material surrounding the enhancing vessels. As previously, this could be related to acute arteritis, possibly infectious, or to a contained perforation with persistent leakage. Reproductive: Normal except for an enlarged prostate. Other: No free fluid or air. Musculoskeletal: Chronic degenerative changes of the spine, with  chronic hemangioma within L2 and chronic endplate Schmorl's nodes. IMPRESSION: 1. Interval colectomy on the right. No complicating feature seen relative to that. 2. Worsening of the appearance at the distal aorta, aortic bifurcation and left iliac region. As previously, this could be related to acute arteritis, possibly infectious, or to a contained perforation with persistent leakage. There is low-density adjacent to the vessel which could represent actual abscess. 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 Infection is the main concern in this case. Congestive heart failure/fluid overload with bilateral effusions, interstitial edema and dependent atelectasis. Electronically Signed   By: [name] M.D.   On: 07/08/2018 15:52   Ct Abdomen Pelvis W Contrast  Result Date: 07/08/2018 CLINICAL DATA:  Dialysis patient. Weakness and dizziness. Chest and abdominal pain. EXAM: CT CHEST, ABDOMEN, AND PELVIS WITH CONTRAST TECHNIQUE: Multidetector CT imaging of the chest, abdomen and pelvis was performed following the standard protocol during bolus administration of intravenous contrast. CONTRAST:  126mL ISOVUE-300 IOPAMIDOL (ISOVUE-300) INJECTION 61% COMPARISON:  Radiography same day. Chest CT 04/18/2018. abdominal CT 05/14/2018 FINDINGS: CT CHEST FINDINGS Cardiovascular: Cardiomegaly. Small pericardial effusion. Coronary artery calcification. Aortic valve calcification. Thoracic aortic atherosclerotic calcification. No aneurysm or dissection. Mediastinum/Nodes: No mass or adenopathy. Lungs/Pleura: Bilateral effusions layering dependently with dependent pulmonary atelectasis. Interstitial prominence likely reflecting interstitial edema. No non dependent pathology otherwise. Musculoskeletal: No acute thoracic or rib finding. Ordinary spinal degenerative change. CT ABDOMEN PELVIS FINDINGS Hepatobiliary: Liver parenchyma  appears normal. No evidence of cholecystitis. Pancreas: Normal Spleen: Normal Adrenals/Urinary Tract: Adrenal  glands are normal. Kidneys are poorly functioning. No evidence of obstruction or mass. Bladder shows wall thickening. Stomach/Bowel: Interval colectomy on the right. No complicating feature seen relative to that. No sign of bowel obstruction. Vascular/Lymphatic: Worsening of the appearance at the distal aorta, aortic bifurcation and left iliac region, with more surrounding extravascular density. Actual low-density material surrounding the enhancing vessels. As previously, this could be related to acute arteritis, possibly infectious, or to a contained perforation with persistent leakage. Reproductive: Normal except for an enlarged prostate. Other: No free fluid or air. Musculoskeletal: Chronic degenerative changes of the spine, with chronic hemangioma within L2 and chronic endplate Schmorl's nodes. IMPRESSION: 1. Interval colectomy on the right. No complicating feature seen relative to that. 2. Worsening of the appearance at the distal aorta, aortic bifurcation and left iliac region. As previously, this could be related to acute arteritis, possibly infectious, or to a contained perforation with persistent leakage. There is low-density adjacent to the vessel which could represent actual abscess. 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 Infection is the main concern in this case. Congestive heart failure/fluid overload with bilateral effusions, interstitial edema and dependent atelectasis. Electronically Signed   By: [name] M.D.   On: 07/08/2018 15:52   Dg Chest Portable 1 View  Result Date: 07/08/2018 CLINICAL DATA:  Weakness and dizziness EXAM: PORTABLE CHEST 1 VIEW COMPARISON:  06/19/2018 FINDINGS: Moderate cardiomegaly is unchanged. There is mild interstitial pulmonary edema. No pleural effusion  or pneumothorax. IMPRESSION: Moderate cardiomegaly with mild interstitial pulmonary edema. Electronically Signed   By: Ulyses Jarred M.D.   On: 07/08/2018 13:41      IMPRESSION AND PLAN:   Aortitis- CT abdomen pelvis with acute arteritis of the distal aorta, possibly due to infection or contained perforation with persistent leakage.  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 -Vascular surgery consulted- recommended broad-spectrum antibiotics, corticosteroids, and repeat CTA aorta in 48-72 hours. -Start vancomycin and zosyn -Solumedrol IV 125mg  daily -Blood cultures pending -ID consult -Repeat CTA aorta ordered for 12/25  Acute hypoxic respiratory failure- likely secondary to acute diastolic CHF and volume overload in the setting of ESRD.  CT chest with bilateral pleural effusions and interstitial edema. Recent ECHO with EF 60-65%. -Volume management per nephro -Wean O2 as able  Hypertension- hypertensive in the ED, goal SBP <140 per vascular recommendations. -Continue home norvasc and coreg -Hydralazine IV as needed  Type 2 diabetes- hyperglycemic in the ED -Sensitive SSI -Check A1c  ESRD on HD- receives HD MWF, but received HD yesterday (Sunday) due to holiday schedule. -Nephrology consult  Anemia of chronic kidney disease- hemoglobin 8.3 -Continue home ferric citrate -Monitor hemoglobin  All the records are reviewed and case discussed with ED provider. Management plans discussed with the patient, family and they are in agreement.  CODE STATUS: DNI  TOTAL TIME TAKING CARE OF THIS PATIENT: 45 minutes.    Berna Spare Nocholas Damaso M.D on 07/08/2018 at 6:20 PM  Between 7am to 6pm - Pager [phone]  After 6pm go to [email or web address] - Technical brewer [place] [facility]  Office  [phone]  CC: Primary care physician; [employer]   Note: This dictation was prepared with Diplomatic Services operational officer dictation  along with smaller Company secretary. Any transcriptional errors that result from this process are unintentional.

## 2018-07-08 NOTE — Progress Notes (Signed)
Family Meeting Note  Advance Directive:yes  Today a meeting took place with the Patient.  Patient is able to participate.  The following clinical team members were present during this meeting:MD  The following were discussed:Patient's diagnosis: aortitis, Patient's progosis: Unable to determine and Goals for treatment: DNI  Patient states that he is overall pretty healthy.  We discussed CODE STATUS in detail.  He states he has thought about this a lot, and knows that he does not want to be intubated.  He would still like to receive cardiac resuscitation.  Will make patient DNI.  Additional follow-up to be provided: prn  Time spent during discussion:20 minutes  Evette Doffing, MD

## 2018-07-08 NOTE — Progress Notes (Signed)
Pharmacy Antibiotic Note  Jonathan Mcgrath is a 77 y.o. male admitted on 07/08/2018 with aortitis.  Pt is on HD at home every MWF.  He had HD session on 12/22 (Sun) due to holiday schedule.  Uncertain if he will receive HD on 12/23 or wait til 12/24 or 12/25 to resume HD. Pharmacy has been consulted for Vancomycin dosing.  Plan: Vancomycin 2 gm IV X 1 ordered to be given 12/23 @ 1900. Vancomycin 1 gm IV Q MWF with HD currently ordered to start 12/25.  May need to adjust depending on when next HD session is. Will draw 1st VT 30 minutes before 3rd HD session on 12/30.  May need to adjust depending on HD schedule.    Height: 6\' 1"  (185.4 cm) Weight: 198 lb 3.1 oz (89.9 kg) IBW/kg (Calculated) : 79.9  Temp (24hrs), Avg:97.7 F (36.5 C), Min:97.7 F (36.5 C), Max:97.7 F (36.5 C)  Recent Labs  Lab 07/08/18 1227  WBC 15.4*  CREATININE 5.41*    Estimated Creatinine Clearance: 12.9 mL/min (A) (by C-G formula based on SCr of 5.41 mg/dL (H)).    No Known Allergies  Antimicrobials this admission:   >>    >>   Dose adjustments this admission:   Microbiology results:  BCx:   UCx:    Sputum:    MRSA PCR:   Thank you for allowing pharmacy to be a part of this patient's care.  Tyleah Loh D 07/08/2018 7:48 PM

## 2018-07-08 NOTE — ED Provider Notes (Addendum)
[facility] Emergency Department Provider Note  ____________________________________________  Time seen: Approximately 3:25 PM  I have reviewed the triage vital signs and the nursing notes.   HISTORY  Chief Complaint Weakness and Flank Pain    HPI TALIK CASIQUE is a 77 y.o. male with a history of colon cancer, end-stage renal disease on hemodialysis for the past 4 years, diabetes, hypertension and secondary oliguria who complains of generalized weakness and lightheadedness for the past 3 days.  Also bilateral abdominal pain that started yesterday.  He had dialysis yesterday, normally he is on a Monday Wednesday Friday schedule but currently on a holiday schedule.  Denies vomiting or chest pain.  He does endorse shortness of breath but denies orthopnea or PND.  Symptoms are intermittent, no specific aggravating or alleviating factors.  Patient was hospitalized with pneumonia 3 weeks ago.  Past Medical History:  Diagnosis Date  . Cancer ([place]) 2019   colon  . Chronic kidney disease   . Diabetes mellitus without complication (HCC)    diet controlled  . Hemodialysis access site with arteriovenous graft ([place])   . Hypertension   . Renal insufficiency    Pt on dialysis x 3 years and receives every Monday, Wednesday and Friday.      Patient Active Problem List   Diagnosis Date Noted  . Acute hypoxemic respiratory failure ([place]) 06/16/2018  . Colon cancer ([place]) 05/16/2018  . Benign neoplasm of ascending colon   . Benign neoplasm of transverse colon   . Polyp of sigmoid colon   . Colon neoplasm   . Mass of cecum   . Diarrhea   . Stomach irritation   . Abdominal pain 04/12/2018  . CAP (community acquired pneumonia) 08/17/2016     Past Surgical History:  Procedure Laterality Date  . AV FISTULA PLACEMENT Left 2015   arm  . CATARACT EXTRACTION W/ INTRAOCULAR LENS  IMPLANT, BILATERAL Bilateral 08/2017  . CENTRAL LINE INSERTION-TUNNELED N/A 04/19/2018    Procedure: CENTRAL LINE INSERTION-TUNNELED;  Surgeon: Delana Meyer Dolores Lory, MD;  Location: [place] [facility];  Service: Cardiovascular;  Laterality: N/A;  . COLON RESECTION N/A 05/16/2018   Procedure: LAPAROSCOPIC  COLON RESECTION;  Surgeon: Herbert Pun, MD;  Location: [facility];  Service: General;  Laterality: N/A;  . COLONOSCOPY N/A 04/18/2018   Procedure: COLONOSCOPY;  Surgeon: Virgel Manifold, MD;  Location: [facility] ENDOSCOPY;  Service: Endoscopy;  Laterality: N/A;  . DIALYSIS/PERMA CATHETER REMOVAL N/A 06/19/2018   Procedure: DIALYSIS/PERMA CATHETER REMOVAL;  Surgeon: Algernon Huxley, MD;  Location: [place] [facility];  Service: Cardiovascular;  Laterality: N/A;  . ESOPHAGOGASTRODUODENOSCOPY N/A 04/18/2018   Procedure: ESOPHAGOGASTRODUODENOSCOPY (EGD);  Surgeon: Virgel Manifold, MD;  Location: [facility] ENDOSCOPY;  Service: Endoscopy;  Laterality: N/A;  . EYE SURGERY    . PERIPHERAL VASCULAR CATHETERIZATION Left 01/12/2016   Procedure: A/V Shuntogram/Fistulagram;  Surgeon: Algernon Huxley, MD;  Location: [place] [facility];  Service: Cardiovascular;  Laterality: Left;  . PERIPHERAL VASCULAR CATHETERIZATION N/A 01/12/2016   Procedure: A/V Shunt Intervention;  Surgeon: Algernon Huxley, MD;  Location: [place] [facility];  Service: Cardiovascular;  Laterality: N/A;  . PERITONEAL CATHETER INSERTION N/A   . REMOVAL OF A DIALYSIS CATHETER N/A 01/14/2015   Procedure: REMOVAL OF A  PERITONEAL DIALYSIS CATHETER;  Surgeon: Algernon Huxley, MD;  Location: [facility];  Service: Vascular;  Laterality: N/A;  . TEE WITHOUT CARDIOVERSION N/A 04/19/2018   Procedure: TRANSESOPHAGEAL ECHOCARDIOGRAM (TEE);  Surgeon: Teodoro Spray,  MD;  Location: [facility];  Service: Cardiovascular;  Laterality: N/A;     Prior to Admission medications   Medication Sig Start Date End Date Taking? Authorizing Provider  amLODipine (NORVASC) 5 MG tablet Take 1 tablet (5 mg total) by mouth daily. Patient taking differently: Take 10  mg by mouth daily.  04/21/18   Dustin Flock, MD  aspirin EC 81 MG EC tablet Take 1 tablet (81 mg total) by mouth daily. 06/21/18   Dustin Flock, MD  carvedilol (COREG) 6.25 MG tablet Take 6.25 mg by mouth 2 (two) times daily with a meal.    [provider]  Cholecalciferol (VITAMIN D PO) Take 2 capsules by mouth 3 (three) times a week. Given during dialysis    [provider]  cinacalcet (SENSIPAR) 30 MG tablet Take 30 mg by mouth every morning.    [provider]  ferric citrate (AURYXIA) 1 GM 210 MG(Fe) tablet Take 210 mg by mouth daily.     [provider]  Menthol, Topical Analgesic, 16 % CREA Apply 1 application topically daily as needed (neuropathy related leg pain).    [provider]  multivitamin (RENA-VIT) TABS tablet Take 1 tablet by mouth daily. 10/12/15   [provider]  ondansetron (ZOFRAN) 4 MG tablet Take 4 mg by mouth 3 (three) times daily as needed for nausea/vomiting. 06/07/18   [provider]     Allergies Patient has no known allergies.   Family History  Problem Relation Age of Onset  . Diabetes Mother   . Heart disease Father     Social History Social History   Tobacco Use  . Smoking status: Never Smoker  . Smokeless tobacco: Never Used  Substance Use Topics  . Alcohol use: No  . Drug use: No    Review of Systems  Constitutional:   No fever or chills.  ENT:   No sore throat. No rhinorrhea. Cardiovascular:   No chest pain or syncope. Respiratory:   Positive shortness of breath without cough. Gastrointestinal:   Positive as above for abdominal pain without vomiting and diarrhea.  Musculoskeletal:   Negative for focal pain or swelling All other systems reviewed and are negative except as documented above in ROS and HPI.  ____________________________________________   PHYSICAL EXAM:  VITAL SIGNS: ED Triage Vitals  Enc Vitals Group     BP 07/08/18 1227 (!) 182/79     Pulse Rate  07/08/18 1221 80     Resp 07/08/18 1227 16     Temp 07/08/18 1223 97.7 F (36.5 C)     Temp Source 07/08/18 1223 Oral     SpO2 07/08/18 1221 (!) 78 %     Weight 07/08/18 1222 198 lb 3.1 oz (89.9 kg)     Height 07/08/18 1222 6\' 1"  (1.854 m)     Head Circumference --      Peak Flow --      Pain Score 07/08/18 1221 0     Pain Loc --      Pain Edu? --      Excl. in [place]? --     Vital signs reviewed, nursing assessments reviewed.   Constitutional:   Alert and oriented. Non-toxic appearance. Eyes:   Conjunctivae are normal. EOMI. PERRL. ENT      Head:   Normocephalic and atraumatic.      Nose:   No congestion/rhinnorhea.       Mouth/Throat:   MMM, no pharyngeal erythema. No peritonsillar mass.  Neck:   No meningismus. Full ROM. Hematological/Lymphatic/Immunilogical:   No cervical lymphadenopathy. Cardiovascular:   RRR. Symmetric bilateral radial and DP pulses.  No murmurs. Cap refill less than 2 seconds. Respiratory:   Normal respiratory effort without tachypnea/retractions. Breath sounds are clear and equal bilaterally. No wheezes/rales/rhonchi. Gastrointestinal:   Soft and nontender. Non distended. There is no CVA tenderness.  No rebound, rigidity, or guarding. Musculoskeletal:   Normal range of motion in all extremities. No joint effusions.  No lower extremity tenderness.  No edema. Neurologic:   Normal speech and language.  Motor grossly intact. No acute focal neurologic deficits are appreciated.  Skin:    Skin is warm, dry and intact. No rash noted.  No petechiae, purpura, or bullae.  ____________________________________________    LABS (pertinent positives/negatives) (all labs ordered are listed, but only abnormal results are displayed) Labs Reviewed  COMPREHENSIVE METABOLIC PANEL - Abnormal; Notable for the following components:      Result Value   Glucose, Bld 123 (*)    BUN 28 (*)    Creatinine, Ser 5.41 (*)    Calcium 8.3 (*)    Total Protein 6.2 (*)     Albumin 3.4 (*)    GFR calc non Af Amer 9 (*)    GFR calc Af Amer 11 (*)    All other components within normal limits  CBC WITH DIFFERENTIAL/PLATELET - Abnormal; Notable for the following components:   WBC 15.4 (*)    RBC 2.80 (*)    Hemoglobin 8.3 (*)    HCT 26.7 (*)    RDW 18.8 (*)    Platelets 148 (*)    Neutro Abs 8.7 (*)    Lymphs Abs 5.9 (*)    All other components within normal limits  LIPASE, BLOOD  INFLUENZA PANEL BY PCR (TYPE A & B)   ____________________________________________   EKG    ____________________________________________    RADIOLOGY  Dg Chest Portable 1 View  Result Date: 07/08/2018 CLINICAL DATA:  Weakness and dizziness EXAM: PORTABLE CHEST 1 VIEW COMPARISON:  06/19/2018 FINDINGS: Moderate cardiomegaly is unchanged. There is mild interstitial pulmonary edema. No pleural effusion or pneumothorax. IMPRESSION: Moderate cardiomegaly with mild interstitial pulmonary edema. Electronically Signed   By: Ulyses Jarred M.D.   On: 07/08/2018 13:41    ____________________________________________   PROCEDURES .Critical Care Performed by: Carrie Mew, MD Authorized by: Carrie Mew, MD   Critical care provider statement:    Critical care time (minutes):  30   Critical care time was exclusive of:  Separately billable procedures and treating other patients   Critical care was necessary to treat or prevent imminent or life-threatening deterioration of the following conditions:  Respiratory failure   Critical care was time spent personally by me on the following activities:  Development of treatment plan with patient or surrogate, discussions with consultants, evaluation of patient's response to treatment, examination of patient, obtaining history from patient or surrogate, ordering and performing treatments and interventions, ordering and review of laboratory studies, ordering and review of radiographic studies, pulse oximetry, re-evaluation of  patient's condition and review of old charts    ____________________________________________  DIFFERENTIAL DIAGNOSIS   Pneumonia, pulmonary edema, pneumothorax.  Doubt PE ACS dissection AAA.  Possible occult intra-abdominal pathology  CLINICAL IMPRESSION / ASSESSMENT AND PLAN / ED COURSE  Pertinent labs & imaging results that were available during my care of the patient were reviewed by me and considered in my medical decision making (see chart for details).    Patient  presents with abdominal pain in the setting of end-stage renal disease and other severe comorbidities.  Vital signs reveal severe hypoxia with a room air oxygen saturation of 78%.  Abdominal exam is unremarkable.  Patient placed on nasal cannula oxygen for respiratory support.  Labs show a white blood cell count of 15.4k, and chest x-ray shows a small interstitial edema.  I have ordered a CT scan of the chest abdomen pelvis with IV contrast for further evaluation given the concern for acute illness without any focal source or clear exam findings.  Patient will need hospitalization given his hypoxia.  Doubt pulmonary embolism.      ____________________________________________   FINAL CLINICAL IMPRESSION(S) / ED DIAGNOSES    Final diagnoses:  Acute respiratory failure with hypoxia (HCC)  End-stage renal disease on hemodialysis ([place])  Generalized abdominal pain     ED Discharge Orders    None      Portions of this note were generated with dragon dictation software. Dictation errors may occur despite best attempts at proofreading.   Carrie Mew, MD 07/08/18 [place]    Carrie Mew, MD 08/08/18 [phone]

## 2018-07-08 NOTE — ED Notes (Signed)
Pts daughter Rosanne Ashing [phone]

## 2018-07-08 NOTE — Consult Note (Signed)
Reason for Consult:Aortitis/Arteritis Referring Physician: ED  Jonathan Mcgrath is an 77 y.o. male.  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 Tolerating HD- MWF. Tolerating PO. States flank pain is vague.   Past Medical History:  Diagnosis Date  . Cancer ([place]) 2019   colon  . Chronic kidney disease   . Diabetes mellitus without complication (HCC)    diet controlled  . Hemodialysis access site with arteriovenous graft ([place])   . Hypertension   . Renal insufficiency    Pt on dialysis x 3 years and receives every Monday, Wednesday and Friday.     Past Surgical History:  Procedure Laterality Date  . AV FISTULA PLACEMENT Left 2015   arm  . CATARACT EXTRACTION W/ INTRAOCULAR LENS  IMPLANT, BILATERAL Bilateral 08/2017  . CENTRAL LINE INSERTION-TUNNELED N/A 04/19/2018   Procedure: CENTRAL LINE INSERTION-TUNNELED;  Surgeon: Delana Meyer Dolores Lory, MD;  Location: [place] [facility];  Service: Cardiovascular;  Laterality: N/A;  . COLON RESECTION N/A 05/16/2018   Procedure: LAPAROSCOPIC  COLON RESECTION;  Surgeon: Herbert Pun, MD;  Location: [facility];  Service: General;  Laterality: N/A;  . COLONOSCOPY N/A 04/18/2018   Procedure: COLONOSCOPY;  Surgeon: Virgel Manifold, MD;  Location: [facility]COPY;  Service: Endoscopy;  Laterality: N/A;  . DIALYSIS/PERMA CATHETER REMOVAL N/A 06/19/2018   Procedure: DIALYSIS/PERMA CATHETER REMOVAL;  Surgeon: Algernon Huxley, MD;  Location: [place] [facility];  Service: Cardiovascular;  Laterality: N/A;  . ESOPHAGOGASTRODUODENOSCOPY N/A 04/18/2018   Procedure: ESOPHAGOGASTRODUODENOSCOPY (EGD);  Surgeon: Virgel Manifold, MD;  Location: [facility] ENDOSCOPY;  Service: Endoscopy;  Laterality: N/A;  . EYE SURGERY    . PERIPHERAL VASCULAR CATHETERIZATION Left 01/12/2016   Procedure: A/V  Shuntogram/Fistulagram;  Surgeon: Algernon Huxley, MD;  Location: [place] [facility];  Service: Cardiovascular;  Laterality: Left;  . PERIPHERAL VASCULAR CATHETERIZATION N/A 01/12/2016   Procedure: A/V Shunt Intervention;  Surgeon: Algernon Huxley, MD;  Location: [place] [facility];  Service: Cardiovascular;  Laterality: N/A;  . PERITONEAL CATHETER INSERTION N/A   . REMOVAL OF A DIALYSIS CATHETER N/A 01/14/2015   Procedure: REMOVAL OF A  PERITONEAL DIALYSIS CATHETER;  Surgeon: Algernon Huxley, MD;  Location: [facility];  Service: Vascular;  Laterality: N/A;  . TEE WITHOUT CARDIOVERSION N/A 04/19/2018   Procedure: TRANSESOPHAGEAL ECHOCARDIOGRAM (TEE);  Surgeon: Teodoro Spray, MD;  Location: [facility];  Service: Cardiovascular;  Laterality: N/A;    Family History  Problem Relation Age of Onset  . Diabetes Mother   . Heart disease Father     Social History:  reports that he has never smoked. He has never used smokeless tobacco. He reports that he does not drink alcohol or use drugs.  Allergies: No Known Allergies  Medications: I have reviewed the patient's current medications.  Results for orders placed or performed during the hospital encounter of 07/08/18 (from the past 48 hour(s))  Comprehensive metabolic panel     Status: Abnormal   Collection Time: 07/08/18 12:27 PM  Result Value Ref Range   Sodium 139 135 - 145 mmol/L   Potassium 3.5 3.5 - 5.1 mmol/L   Chloride 100 98 - 111 mmol/L   CO2 29 22 - 32 mmol/L   Glucose, Bld 123 (H) 70 - 99 mg/dL   BUN 28 (H) 8 - 23 mg/dL   Creatinine, Ser 5.41 (H) 0.61 - 1.24 mg/dL  Calcium 8.3 (L) 8.9 - 10.3 mg/dL   Total Protein 6.2 (L) 6.5 - 8.1 g/dL   Albumin 3.4 (L) 3.5 - 5.0 g/dL   AST 23 15 - 41 U/L   ALT 19 0 - 44 U/L   Alkaline Phosphatase 95 38 - 126 U/L   Total Bilirubin 1.2 0.3 - 1.2 mg/dL   GFR calc non Af Amer 9 (L) >60 mL/min   GFR calc Af Amer 11 (L) >60 mL/min   Anion gap 10 5 - 15    Comment: Performed at [name], [name] [place], [name]  CBC with Differential     Status: Abnormal   Collection Time: 07/08/18 12:27 PM  Result Value Ref Range   WBC 15.4 (H) 4.0 - 10.5 K/uL   RBC 2.80 (L) 4.22 - 5.81 MIL/uL   Hemoglobin 8.3 (L) 13.0 - 17.0 g/dL   HCT 26.7 (L) 39.0 - 52.0 %   MCV 95.4 80.0 - 100.0 fL   MCH 29.6 26.0 - 34.0 pg   MCHC 31.1 30.0 - 36.0 g/dL   RDW 18.8 (H) 11.5 - 15.5 %   Platelets 148 (L) 150 - 400 K/uL   nRBC 0.0 0.0 - 0.2 %   Neutrophils Relative % 56 %   Neutro Abs 8.7 (H) 1.7 - 7.7 K/uL   Lymphocytes Relative 38 %   Lymphs Abs 5.9 (H) 0.7 - 4.0 K/uL   Monocytes Relative 6 %   Monocytes Absolute 1.0 0.1 - 1.0 K/uL   Eosinophils Relative 0 %   Eosinophils Absolute 0.0 0.0 - 0.5 K/uL   Basophils Relative 0 %   Basophils Absolute 0.1 0.0 - 0.1 K/uL   Immature Granulocytes 0 %   Abs Immature Granulocytes 0.07 0.00 - 0.07 K/uL    Comment: Performed at [name], [name] [name], [name] [address]  Lipase, blood     Status: None   Collection Time: 07/08/18 12:27 PM  Result Value Ref Range   Lipase 29 11 - 51 U/L    Comment: Performed at [name], [name] [place], [name] [address]  Influenza panel by PCR (type A & B)     Status: None   Collection Time: 07/08/18  1:28 PM  Result Value Ref Range   Influenza A By PCR NEGATIVE NEGATIVE   Influenza B By PCR NEGATIVE NEGATIVE    Comment: (NOTE) The Xpert Xpress Flu assay is intended as an aid in the diagnosis of  influenza and should not be used as a sole basis for treatment.  This  assay is FDA approved for nasopharyngeal swab specimens only. Nasal  washings and aspirates are unacceptable for Xpert Xpress Flu testing. Performed at [name], [address], [name], [place] [id number]     Ct Chest W Contrast  Result Date: 07/08/2018 CLINICAL DATA:  Dialysis patient. Weakness and dizziness. Chest and abdominal pain. EXAM: CT CHEST, ABDOMEN, AND PELVIS WITH  CONTRAST TECHNIQUE: Multidetector CT imaging of the chest, abdomen and pelvis was performed following the standard protocol during bolus administration of intravenous contrast. CONTRAST:  186mL ISOVUE-300 IOPAMIDOL (ISOVUE-300) INJECTION 61% COMPARISON:  Radiography same day. Chest CT 04/18/2018. abdominal CT 05/14/2018 FINDINGS: CT CHEST FINDINGS Cardiovascular: Cardiomegaly. Small pericardial effusion. Coronary artery calcification. Aortic valve calcification. Thoracic aortic atherosclerotic calcification. No aneurysm or dissection. Mediastinum/Nodes: No mass or adenopathy. Lungs/Pleura: Bilateral effusions layering dependently with dependent pulmonary atelectasis. Interstitial prominence likely reflecting interstitial edema. No non  dependent pathology otherwise. Musculoskeletal: No acute thoracic or rib finding. Ordinary spinal degenerative change. CT ABDOMEN PELVIS FINDINGS Hepatobiliary: Liver parenchyma appears normal. No evidence of cholecystitis. Pancreas: Normal Spleen: Normal Adrenals/Urinary Tract: Adrenal glands are normal. Kidneys are poorly functioning. No evidence of obstruction or mass. Bladder shows wall thickening. Stomach/Bowel: Interval colectomy on the right. No complicating feature seen relative to that. No sign of bowel obstruction. Vascular/Lymphatic: Worsening of the appearance at the distal aorta, aortic bifurcation and left iliac region, with more surrounding extravascular density. Actual low-density material surrounding the enhancing vessels. As previously, this could be related to acute arteritis, possibly infectious, or to a contained perforation with persistent leakage. Reproductive: Normal except for an enlarged prostate. Other: No free fluid or air. Musculoskeletal: Chronic degenerative changes of the spine, with chronic hemangioma within L2 and chronic endplate Schmorl's nodes. IMPRESSION: 1. Interval colectomy on the right. No complicating feature seen relative to that. 2.  Worsening of the appearance at the distal aorta, aortic bifurcation and left iliac region. As previously, this could be related to acute arteritis, possibly infectious, or to a contained perforation with persistent leakage. There is low-density adjacent to the vessel which could represent actual abscess. 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 Infection is the main concern in this case. Congestive heart failure/fluid overload with bilateral effusions, interstitial edema and dependent atelectasis. Electronically Signed   By: [name] M.D.   On: 07/08/2018 15:52   Ct Abdomen Pelvis W Contrast  Result Date: 07/08/2018 CLINICAL DATA:  Dialysis patient. Weakness and dizziness. Chest and abdominal pain. EXAM: CT CHEST, ABDOMEN, AND PELVIS WITH CONTRAST TECHNIQUE: Multidetector CT imaging of the chest, abdomen and pelvis was performed following the standard protocol during bolus administration of intravenous contrast. CONTRAST:  169mL ISOVUE-300 IOPAMIDOL (ISOVUE-300) INJECTION 61% COMPARISON:  Radiography same day. Chest CT 04/18/2018. abdominal CT 05/14/2018 FINDINGS: CT CHEST FINDINGS Cardiovascular: Cardiomegaly. Small pericardial effusion. Coronary artery calcification. Aortic valve calcification. Thoracic aortic atherosclerotic calcification. No aneurysm or dissection. Mediastinum/Nodes: No mass or adenopathy. Lungs/Pleura: Bilateral effusions layering dependently with dependent pulmonary atelectasis. Interstitial prominence likely reflecting interstitial edema. No non dependent pathology otherwise. Musculoskeletal: No acute thoracic or rib finding. Ordinary spinal degenerative change. CT ABDOMEN PELVIS FINDINGS Hepatobiliary: Liver parenchyma appears normal. No evidence of cholecystitis. Pancreas: Normal Spleen: Normal Adrenals/Urinary Tract: Adrenal glands are normal. Kidneys are poorly functioning. No  evidence of obstruction or mass. Bladder shows wall thickening. Stomach/Bowel: Interval colectomy on the right. No complicating feature seen relative to that. No sign of bowel obstruction. Vascular/Lymphatic: Worsening of the appearance at the distal aorta, aortic bifurcation and left iliac region, with more surrounding extravascular density. Actual low-density material surrounding the enhancing vessels. As previously, this could be related to acute arteritis, possibly infectious, or to a contained perforation with persistent leakage. Reproductive: Normal except for an enlarged prostate. Other: No free fluid or air. Musculoskeletal: Chronic degenerative changes of the spine, with chronic hemangioma within L2 and chronic endplate Schmorl's nodes. IMPRESSION: 1. Interval colectomy on the right. No complicating feature seen relative to that. 2. Worsening of the appearance at the distal aorta, aortic bifurcation and left iliac region. As previously, this could be related to acute arteritis, possibly infectious, or to a contained perforation with persistent leakage. There is low-density adjacent to the vessel which could represent actual abscess. 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 Infection is the main concern in this case. Congestive heart failure/fluid overload with bilateral effusions, interstitial edema and dependent atelectasis. Electronically Signed   By: [name] M.D.   On: 07/08/2018 15:52   Dg Chest Portable 1 View  Result Date: 07/08/2018 CLINICAL DATA:  Weakness and dizziness EXAM: PORTABLE CHEST 1 VIEW COMPARISON:  06/19/2018 FINDINGS: Moderate cardiomegaly is unchanged. There is mild interstitial pulmonary edema. No pleural effusion or pneumothorax. IMPRESSION: Moderate cardiomegaly with mild interstitial pulmonary edema. Electronically Signed   By: Ulyses Jarred M.D.   On: 07/08/2018 13:41     Review of Systems  Constitutional: Positive for malaise/fatigue. Negative for fever and weight loss.  Respiratory: Positive for shortness of breath. Negative for cough.   Cardiovascular: Negative for chest pain, claudication and leg swelling.  Gastrointestinal: Positive for abdominal pain.  Neurological: Negative.    Blood pressure (!) 179/71, pulse 74, temperature 97.7 F (36.5 C), temperature source Oral, resp. rate (!) 22, height 6\' 1"  (1.854 m), weight 89.9 kg, SpO2 99 %. Physical Exam  Nursing note and vitals reviewed. Constitutional: He is oriented to person, place, and time. He appears well-developed and well-nourished. No distress.  Neck: Normal range of motion.  Cardiovascular: Normal rate, regular rhythm and intact distal pulses.  Respiratory: Effort normal. He has rales.  GI: Soft. He exhibits no distension. There is no abdominal tenderness. There is no rebound and no guarding.  Musculoskeletal:        General: No tenderness or edema.  Neurological: He is alert and oriented to person, place, and time.  Skin: Skin is warm.    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 Noted significant inflammatory reaction.  Will require: BP control. SBP <140 Blood Cultures Broad spectrum IV ABX. ID Consult. CorticoSteroids  Obtain a CTA of Aorta in 48-72 hours.   If blood cultures negative MAY consider endovascular intervention. However, may require an axillary bifemoral bypass or other extensive operative intervention if no improvement once steroids and IV ABX started.  Cardiac Workup./ ECHO Continue hemodialysis.    Mintie Witherington A 07/08/2018, 5:21 PM

## 2018-07-08 NOTE — ED Provider Notes (Signed)
CT abd/pel returned and is concerning for possible arteritis with abscess. Discussed with both Dr. Lorenso Courier with vascular surgery and Dr. Dahlia Byes with general surgery given recent colectomy. Both will evaluate the images. Discussed this with admitting hospitalist.    Nance Pear, MD 07/09/18 [phone]

## 2018-07-08 NOTE — ED Triage Notes (Signed)
Pt to ED via EMS from home. Pt c/o weakness dizzy x few days. Pt is dialysis pt had dialysis yesterday, pt c/o intermittent bilateral side pain. Pt had pneumonia 3 weeks ago and colon cancer surgery a few weeks ago. NAD.

## 2018-07-09 DIAGNOSIS — Z9049 Acquired absence of other specified parts of digestive tract: Secondary | ICD-10-CM

## 2018-07-09 DIAGNOSIS — C180 Malignant neoplasm of cecum: Secondary | ICD-10-CM

## 2018-07-09 DIAGNOSIS — Z8619 Personal history of other infectious and parasitic diseases: Secondary | ICD-10-CM

## 2018-07-09 DIAGNOSIS — Z992 Dependence on renal dialysis: Secondary | ICD-10-CM

## 2018-07-09 DIAGNOSIS — N186 End stage renal disease: Secondary | ICD-10-CM

## 2018-07-09 DIAGNOSIS — I776 Arteritis, unspecified: Principal | ICD-10-CM

## 2018-07-09 DIAGNOSIS — I120 Hypertensive chronic kidney disease with stage 5 chronic kidney disease or end stage renal disease: Secondary | ICD-10-CM

## 2018-07-09 LAB — BASIC METABOLIC PANEL
Anion gap: 11 (ref 5–15)
BUN: 34 mg/dL — ABNORMAL HIGH (ref 8–23)
CO2: 27 mmol/L (ref 22–32)
Calcium: 8.4 mg/dL — ABNORMAL LOW (ref 8.9–10.3)
Chloride: 101 mmol/L (ref 98–111)
Creatinine, Ser: 6.39 mg/dL — ABNORMAL HIGH (ref 0.61–1.24)
GFR calc Af Amer: 9 mL/min — ABNORMAL LOW (ref >60)
GFR calc non Af Amer: 8 mL/min — ABNORMAL LOW (ref >60)
Glucose, Bld: 96 mg/dL (ref 70–99)
Potassium: 3.3 mmol/L — ABNORMAL LOW (ref 3.5–5.1)
Sodium: 139 mmol/L (ref 135–145)

## 2018-07-09 LAB — CBC
HCT: 25.2 % — ABNORMAL LOW (ref 39.0–52.0)
Hemoglobin: 8 g/dL — ABNORMAL LOW (ref 13.0–17.0)
MCH: 29.3 pg (ref 26.0–34.0)
MCHC: 31.7 g/dL (ref 30.0–36.0)
MCV: 92.3 fL (ref 80.0–100.0)
Platelets: 127 10*3/uL — ABNORMAL LOW (ref 150–400)
RBC: 2.73 MIL/uL — ABNORMAL LOW (ref 4.22–5.81)
RDW: 18.7 % — ABNORMAL HIGH (ref 11.5–15.5)
WBC: 15.7 10*3/uL — ABNORMAL HIGH (ref 4.0–10.5)
nRBC: 0 % (ref 0.0–0.2)

## 2018-07-09 LAB — MRSA PCR SCREENING: MRSA by PCR: NEGATIVE

## 2018-07-09 LAB — GLUCOSE, CAPILLARY
Glucose-Capillary: 135 mg/dL — ABNORMAL HIGH (ref 70–99)
Glucose-Capillary: 168 mg/dL — ABNORMAL HIGH (ref 70–99)
Glucose-Capillary: 74 mg/dL (ref 70–99)
Glucose-Capillary: 96 mg/dL (ref 70–99)

## 2018-07-09 MED ORDER — ONDANSETRON HCL 4 MG/2ML IJ SOLN
4.0000 mg | Freq: Four times a day (QID) | INTRAMUSCULAR | Status: DC | PRN
  Administered 2018-07-12: 4 mg via INTRAVENOUS
  Filled 2018-07-09: qty 2

## 2018-07-09 MED ORDER — FERRIC CITRATE 1 GM 210 MG(FE) PO TABS
210.0000 mg | ORAL_TABLET | Freq: Every day | ORAL | Status: DC
  Administered 2018-07-09 – 2018-07-10 (×2): 210 mg via ORAL
  Filled 2018-07-09 (×2): qty 1

## 2018-07-09 MED ORDER — POLYETHYLENE GLYCOL 3350 17 G PO PACK
17.0000 g | PACK | Freq: Every day | ORAL | Status: DC | PRN
  Administered 2018-07-09: 17 g via ORAL
  Filled 2018-07-09: qty 1

## 2018-07-09 MED ORDER — CARVEDILOL 12.5 MG PO TABS
12.5000 mg | ORAL_TABLET | Freq: Two times a day (BID) | ORAL | Status: DC
  Administered 2018-07-09 – 2018-07-18 (×20): 12.5 mg via ORAL
  Filled 2018-07-09 (×20): qty 1

## 2018-07-09 MED ORDER — CINACALCET HCL 30 MG PO TABS
30.0000 mg | ORAL_TABLET | ORAL | Status: DC
  Administered 2018-07-09 – 2018-07-18 (×9): 30 mg via ORAL
  Filled 2018-07-09 (×11): qty 1

## 2018-07-09 MED ORDER — EPOETIN ALFA 10000 UNIT/ML IJ SOLN
10000.0000 [IU] | INTRAMUSCULAR | Status: DC
  Administered 2018-07-12 – 2018-07-17 (×3): 10000 [IU] via INTRAVENOUS

## 2018-07-09 MED ORDER — CHLORHEXIDINE GLUCONATE CLOTH 2 % EX PADS
6.0000 | MEDICATED_PAD | Freq: Every day | CUTANEOUS | Status: DC
  Administered 2018-07-10 – 2018-07-15 (×3): 6 via TOPICAL

## 2018-07-09 MED ORDER — SODIUM CHLORIDE 0.9 % IV SOLN: 100.0000 mL | INTRAVENOUS | Status: DC | PRN

## 2018-07-09 MED ORDER — HEPARIN SODIUM (PORCINE) 1000 UNIT/ML DIALYSIS
1000.0000 [IU] | INTRAMUSCULAR | Status: DC | PRN
  Filled 2018-07-09: qty 1

## 2018-07-09 MED ORDER — HYDRALAZINE HCL 25 MG PO TABS
25.0000 mg | ORAL_TABLET | Freq: Three times a day (TID) | ORAL | Status: DC
  Administered 2018-07-09 – 2018-07-12 (×10): 25 mg via ORAL
  Filled 2018-07-09 (×10): qty 1

## 2018-07-09 MED ORDER — PENTAFLUOROPROP-TETRAFLUOROETH EX AERO
1.0000 "application " | INHALATION_SPRAY | CUTANEOUS | Status: DC | PRN
  Filled 2018-07-09: qty 30

## 2018-07-09 MED ORDER — RENA-VITE PO TABS
1.0000 | ORAL_TABLET | Freq: Every day | ORAL | Status: DC
  Administered 2018-07-09 – 2018-07-18 (×7): 1 via ORAL
  Filled 2018-07-09 (×10): qty 1

## 2018-07-09 MED ORDER — HEPARIN SODIUM (PORCINE) 5000 UNIT/ML IJ SOLN
5000.0000 [IU] | Freq: Three times a day (TID) | INTRAMUSCULAR | Status: DC
  Administered 2018-07-09 – 2018-07-17 (×19): 5000 [IU] via SUBCUTANEOUS
  Filled 2018-07-09 (×20): qty 1

## 2018-07-09 MED ORDER — ALTEPLASE 2 MG IJ SOLR: 2.0000 mg | Freq: Once | INTRAMUSCULAR | Status: DC | PRN

## 2018-07-09 MED ORDER — VANCOMYCIN HCL IN DEXTROSE 1-5 GM/200ML-% IV SOLN
1000.0000 mg | INTRAVENOUS | Status: DC | PRN
  Administered 2018-07-09: 1000 mg via INTRAVENOUS
  Filled 2018-07-09: qty 200

## 2018-07-09 MED ORDER — AMLODIPINE BESYLATE 10 MG PO TABS
10.0000 mg | ORAL_TABLET | Freq: Every day | ORAL | Status: DC
  Administered 2018-07-09 – 2018-07-18 (×10): 10 mg via ORAL
  Filled 2018-07-09 (×10): qty 1

## 2018-07-09 MED ORDER — ASPIRIN EC 81 MG PO TBEC
81.0000 mg | DELAYED_RELEASE_TABLET | Freq: Every day | ORAL | Status: DC
  Administered 2018-07-09 – 2018-07-18 (×9): 81 mg via ORAL
  Filled 2018-07-09 (×9): qty 1

## 2018-07-09 MED ORDER — ACETAMINOPHEN 650 MG RE SUPP: 650.0000 mg | Freq: Four times a day (QID) | RECTAL | Status: DC | PRN

## 2018-07-09 MED ORDER — CARVEDILOL 6.25 MG PO TABS: 6.2500 mg | ORAL_TABLET | Freq: Two times a day (BID) | ORAL | Status: DC

## 2018-07-09 MED ORDER — INSULIN ASPART 100 UNIT/ML ~~LOC~~ SOLN
0.0000 [IU] | Freq: Three times a day (TID) | SUBCUTANEOUS | Status: DC
  Administered 2018-07-09: 1 [IU] via SUBCUTANEOUS
  Administered 2018-07-10 – 2018-07-11 (×2): 2 [IU] via SUBCUTANEOUS
  Administered 2018-07-11: 1 [IU] via SUBCUTANEOUS
  Administered 2018-07-11: 3 [IU] via SUBCUTANEOUS
  Administered 2018-07-12 (×2): 2 [IU] via SUBCUTANEOUS
  Administered 2018-07-13: 3 [IU] via SUBCUTANEOUS
  Administered 2018-07-16: 2 [IU] via SUBCUTANEOUS
  Administered 2018-07-16: 3 [IU] via SUBCUTANEOUS
  Administered 2018-07-17: 1 [IU] via SUBCUTANEOUS
  Filled 2018-07-09 (×13): qty 1

## 2018-07-09 MED ORDER — METHYLPREDNISOLONE SODIUM SUCC 125 MG IJ SOLR
125.0000 mg | Freq: Every day | INTRAMUSCULAR | Status: DC
  Administered 2018-07-09 – 2018-07-11 (×3): 125 mg via INTRAVENOUS
  Filled 2018-07-09 (×3): qty 2

## 2018-07-09 MED ORDER — ONDANSETRON HCL 4 MG PO TABS: 4.0000 mg | ORAL_TABLET | Freq: Four times a day (QID) | ORAL | Status: DC | PRN

## 2018-07-09 MED ORDER — ACETAMINOPHEN 325 MG PO TABS
650.0000 mg | ORAL_TABLET | Freq: Four times a day (QID) | ORAL | Status: DC | PRN
  Administered 2018-07-12 – 2018-07-13 (×3): 650 mg via ORAL
  Administered 2018-07-16: 325 mg via ORAL
  Filled 2018-07-09 (×4): qty 2

## 2018-07-09 MED ORDER — LIDOCAINE-PRILOCAINE 2.5-2.5 % EX CREA
1.0000 "application " | TOPICAL_CREAM | CUTANEOUS | Status: DC | PRN
  Filled 2018-07-09: qty 5

## 2018-07-09 MED ORDER — INSULIN ASPART 100 UNIT/ML ~~LOC~~ SOLN
0.0000 [IU] | Freq: Every day | SUBCUTANEOUS | Status: DC
  Administered 2018-07-12: 2 [IU] via SUBCUTANEOUS
  Administered 2018-07-13: 3 [IU] via SUBCUTANEOUS
  Administered 2018-07-16: 2 [IU] via SUBCUTANEOUS
  Filled 2018-07-09 (×3): qty 1

## 2018-07-09 MED ORDER — LIDOCAINE HCL (PF) 1 % IJ SOLN
5.0000 mL | INTRAMUSCULAR | Status: DC | PRN
  Filled 2018-07-09: qty 5

## 2018-07-09 NOTE — Progress Notes (Signed)
This note also relates to the following rows which could not be included: Pulse Rate - Cannot attach notes to unvalidated device data Resp - Cannot attach notes to unvalidated device data BP - Cannot attach notes to unvalidated device data  Hd completed  

## 2018-07-09 NOTE — Consult Note (Signed)
NAME: Jonathan Mcgrath  DOB: Dec 25, 1940  MRN: [id number]  Date/Time: 07/09/2018 5:05 PM  Dr.MAyo Subjective:  REASON FOR CONSULT: aortitis ? 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 There was also aortitis seen inthe CT angio of the abdomen. Patient was initially treated with IV Unasyn and was discharged home on Unasyn. But unfortunately because of twice a day dose and his son not able to give him the morning doses because of work and dialysis schedule Unasyn was not continued and he was changed to vancomycin  and p.o. Flagyl. 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 No contrast extravasation. 2. Persistent though reduced perivascular stranding surrounding the distal abdominal aorta and left common iliac artery. He underwent lap colectomy with resection of the mass and  Primary anastomosis on 05/16/18. He was discharged on Zosyn which he got until 11/28 . (and the plan was to give Augmentin indefinitely) . He got readmitted to [facility] on with SOB and was found to be hypoxic with CXR showing features of fluid overload and aspiration pneumonia. He was treated with IV antibiotics and later discharged on 12/5 to NH on Po augmentin. He  stayed at [facility] until 12/20 and was DC home on 12/20. That day he took Fleets enema at home. On Sunday he had vomiting and diarrhea and also noted b/l side pain and on Monday he was brought to the ED. Not sure whether he was discharged from [facility] on Augmentin. In the ED BP was 194/78, temp 97.7, wbc was 15.7 CT abdomen showed Worsening of the appearance at the distal aorta, aortic bifurcation and left iliac region. As previously, this could be related to acute arteritis, possibly infectious, or to a contained perforation with persistent leakage. There is low-density adjacent to the vessel which could represent actual abscess. 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  He was started on vanco and zosyn and seen by vascular who recommended steroids as well   Past Medical History:  Diagnosis Date  . Cancer ([place]) 2019   colon  . Chronic kidney disease   . Diabetes mellitus without complication (HCC)    diet controlled  . Hemodialysis access site with arteriovenous graft ([place])   . Hypertension   . Renal insufficiency    Pt on dialysis x 3 years and receives every Monday, Wednesday and Friday.     Past Surgical History:  Procedure Laterality Date  . AV FISTULA PLACEMENT Left 2015   arm  . CATARACT EXTRACTION W/ INTRAOCULAR LENS  IMPLANT, BILATERAL Bilateral 08/2017  . CENTRAL LINE INSERTION-TUNNELED N/A 04/19/2018   Procedure: CENTRAL LINE INSERTION-TUNNELED;  Surgeon: Delana Meyer Dolores Lory, MD;  Location: [place] [facility];  Service: Cardiovascular;  Laterality: N/A;  . COLON  RESECTION N/A 05/16/2018   Procedure: LAPAROSCOPIC  COLON RESECTION;  Surgeon: Herbert Pun, MD;  Location: [facility] ORS;  Service: General;  Laterality: N/A;  . COLONOSCOPY N/A 04/18/2018   Procedure: COLONOSCOPY;  Surgeon: Virgel Manifold, MD;  Location: [facility] ENDOSCOPY;  Service: Endoscopy;  Laterality: N/A;   . DIALYSIS/PERMA CATHETER REMOVAL N/A 06/19/2018   Procedure: DIALYSIS/PERMA CATHETER REMOVAL;  Surgeon: Algernon Huxley, MD;  Location: [place] [facility];  Service: Cardiovascular;  Laterality: N/A;  . ESOPHAGOGASTRODUODENOSCOPY N/A 04/18/2018   Procedure: ESOPHAGOGASTRODUODENOSCOPY (EGD);  Surgeon: Virgel Manifold, MD;  Location: [facility] ENDOSCOPY;  Service: Endoscopy;  Laterality: N/A;  . EYE SURGERY    . PERIPHERAL VASCULAR CATHETERIZATION Left 01/12/2016   Procedure: A/V Shuntogram/Fistulagram;  Surgeon: Algernon Huxley, MD;  Location: [place] [facility];  Service: Cardiovascular;  Laterality: Left;  . PERIPHERAL VASCULAR CATHETERIZATION N/A 01/12/2016   Procedure: A/V Shunt Intervention;  Surgeon: Algernon Huxley, MD;  Location: [place] [facility];  Service: Cardiovascular;  Laterality: N/A;  . PERITONEAL CATHETER INSERTION N/A   . REMOVAL OF A DIALYSIS CATHETER N/A 01/14/2015   Procedure: REMOVAL OF A  PERITONEAL DIALYSIS CATHETER;  Surgeon: Algernon Huxley, MD;  Location: [facility] ORS;  Service: Vascular;  Laterality: N/A;  . TEE WITHOUT CARDIOVERSION N/A 04/19/2018   Procedure: TRANSESOPHAGEAL ECHOCARDIOGRAM (TEE);  Surgeon: Teodoro Spray, MD;  Location: [facility] ORS;  Service: Cardiovascular;  Laterality: N/A;      SH Pt lives with his son Non smoker  Family History  Problem Relation Age of Onset  . Diabetes Mother   . Heart disease Father    No Known Allergies  ? Current Facility-Administered Medications  Medication Dose Route Frequency Provider Last Rate Last Dose  . acetaminophen (TYLENOL) tablet 650 mg  650 mg Oral Q6H PRN Mayo, Pete Pelt, MD       Or  . acetaminophen (TYLENOL) suppository 650 mg  650 mg Rectal Q6H PRN Mayo, Pete Pelt, MD      . amLODipine (NORVASC) tablet 10 mg  10 mg Oral Daily Mayo, Pete Pelt, MD   10 mg at 07/09/18 1412  . aspirin EC tablet 81 mg  81 mg Oral Daily Mayo, Pete Pelt, MD   81 mg at 07/09/18 1412  . carvedilol (COREG) tablet 12.5 mg  12.5 mg Oral BID  WC Mody, Sital, MD   12.5 mg at 07/09/18 0845  . Chlorhexidine Gluconate Cloth 2 % PADS 6 each  6 each Topical Q0600 Lateef, Munsoor, MD      . cinacalcet (SENSIPAR) tablet 30 mg  30 mg Oral Ferdie Ping, Pete Pelt, MD   30 mg at 07/09/18 0844  . [START ON 07/10/2018] epoetin alfa (EPOGEN,PROCRIT) injection 10,000 Units  10,000 Units Intravenous Q M,W,F-HD Lateef, Munsoor, MD      . ferric citrate (AURYXIA) tablet 210 mg  210 mg Oral Daily Mayo, Pete Pelt, MD   210 mg at 07/09/18 1420  . heparin injection 5,000 Units  5,000 Units Subcutaneous Q8H Mayo, Pete Pelt, MD   5,000 Units at 07/09/18 1412  . hydrALAZINE (APRESOLINE) injection 5 mg  5 mg Intravenous Q4H PRN Mayo, Pete Pelt, MD   5 mg at 07/08/18 1926  . hydrALAZINE (APRESOLINE) tablet 25 mg  25 mg Oral Q8H Mody, Sital, MD   25 mg at 07/09/18 1412  . insulin aspart (novoLOG) injection 0-5 Units  0-5 Units Subcutaneous QHS Mayo, Pete Pelt, MD      . insulin aspart (  novoLOG) injection 0-9 Units  0-9 Units Subcutaneous TID WC Mayo, Pete Pelt, MD      . labetalol (NORMODYNE,TRANDATE) injection 10 mg  10 mg Intravenous Once PRN Mayo, Pete Pelt, MD      . methylPREDNISolone sodium succinate (SOLU-MEDROL) 125 mg/2 mL injection 125 mg  125 mg Intravenous Daily Mayo, Pete Pelt, MD   125 mg at 07/09/18 1413  . multivitamin (RENA-VIT) tablet 1 tablet  1 tablet Oral Daily Mayo, Pete Pelt, MD   1 tablet at 07/09/18 1420  . ondansetron (ZOFRAN) tablet 4 mg  4 mg Oral Q6H PRN Mayo, Pete Pelt, MD       Or  . ondansetron [facility]) injection 4 mg  4 mg Intravenous Q6H PRN Mayo, Pete Pelt, MD      . piperacillin-tazobactam (ZOSYN) IVPB 3.375 g  3.375 g Intravenous Q12H [name], MD 12.5 mL/hr at 07/09/18 0850 3.375 g at 07/09/18 0850  . polyethylene glycol (MIRALAX / GLYCOLAX) packet 17 g  17 g Oral Daily PRN Mayo, Pete Pelt, MD      . vancomycin (VANCOCIN) IVPB 1000 mg/200 mL premix  1,000 mg Intravenous Q dialysis Shari Prows, RPH          Abtx:  Anti-infectives (From admission, onward)   Start     Dose/Rate Route Frequency Ordered Stop   07/10/18 1200  vancomycin (VANCOCIN) IVPB 1000 mg/200 mL premix  Status:  Discontinued     1,000 mg 200 mL/hr over 60 Minutes Intravenous Every M-W-F (Hemodialysis) 07/08/18 1941 07/09/18 1242   07/09/18 1241  vancomycin (VANCOCIN) IVPB 1000 mg/200 mL premix     1,000 mg 200 mL/hr over 60 Minutes Intravenous Every Dialysis 07/09/18 1242     07/08/18 1915  piperacillin-tazobactam (ZOSYN) IVPB 3.375 g     3.375 g 12.5 mL/hr over 240 Minutes Intravenous Every 12 hours 07/08/18 1909     07/08/18 1915  vancomycin (VANCOCIN) 2,000 mg in sodium chloride 0.9 % 500 mL IVPB     2,000 mg 250 mL/hr over 120 Minutes Intravenous  Once 07/08/18 1913 07/08/18 2321      REVIEW OF SYSTEMS:  Const: negative fever, negative chills, negative weight loss Eyes: negative diplopia or visual changes, negative eye pain ENT: negative coryza, negative sore throat Resp: negative cough, hemoptysis, dyspnea Cards: negative for chest pain, palpitations, lower extremity edema GU: negative for frequency, dysuria and hematuria GI: abdominal pain, diarrhea, bleeding, constipation Skin: negative for rash and pruritus Heme: negative for easy bruising and gum/nose bleeding MS: negative for myalgias, arthralgias, back pain and muscle weakness Neurolo:negative for headaches, dizziness, vertigo, memory problems  Psych: negative for feelings of anxiety, depression  Endocrine: negative for thyroid, diabetes Allergy/Immunology- negative for any medication or food allergies ? Pertinent Positives include : Objective:  VITALS:  BP (!) 152/64 (BP Location: Right Arm)   Pulse 69   Temp 98 F (36.7 C) (Oral)   Resp 14   Ht 6\' 1"  (1.854 m)   Wt 89.8 kg   SpO2 95%   BMI 26.12 kg/m  PHYSICAL EXAM:  General: Alert, cooperative, no distress, appears stated age.  Head: Normocephalic, without obvious abnormality,  atraumatic. Eyes: Conjunctivae clear, anicteric sclerae. Pupils are equal ENT Nares normal. No drainage or sinus tenderness. Lips, mucosa, and tongue normal. No Thrush Neck: Supple, symmetrical, no adenopathy, thyroid: non tender no carotid bruit and no JVD. Back: No CVA tenderness. Lungs: b/l air entry Heart: Regular rate and rhythm, no murmur, rub or gallop.  Abdomen: Soft, non-tender,not distended. Bowel sounds normal. No masses Extremities: atraumatic, no cyanosis. No edema. No clubbing Skin: No rashes or lesions. Or bruising Lymph: Cervical, supraclavicular normal. Neurologic: Grossly non-focal Pertinent Labs Lab Results CBC    Component Value Date/Time   WBC 15.7 (H) 07/09/2018 0317   RBC 2.73 (L) 07/09/2018 0317   HGB 8.0 (L) 07/09/2018 0317   HGB 10.6 (L) 04/12/2014 0433   HCT 25.2 (L) 07/09/2018 0317   HCT 33.5 (L) 04/12/2014 0433   PLT 127 (L) 07/09/2018 0317   PLT 123 (L) 04/12/2014 0433   MCV 92.3 07/09/2018 0317   MCV 94 04/12/2014 0433   MCH 29.3 07/09/2018 0317   MCHC 31.7 07/09/2018 0317   RDW 18.7 (H) 07/09/2018 0317   RDW 19.2 (H) 04/12/2014 0433   LYMPHSABS 5.9 (H) 07/08/2018 1227   LYMPHSABS 11.8 (H) 04/11/2014 1951   MONOABS 1.0 07/08/2018 1227   MONOABS 1.2 (H) 04/11/2014 1951   EOSABS 0.0 07/08/2018 1227   EOSABS 0.1 04/11/2014 1951   BASOSABS 0.1 07/08/2018 1227   BASOSABS 0.2 (H) 04/11/2014 1951    CMP Latest Ref Rng & Units 07/09/2018 07/08/2018 06/20/2018  Glucose 70 - 99 mg/dL 96 123(H) 92  BUN 8 - 23 mg/dL 34(H) 28(H) 26(H)  Creatinine 0.61 - 1.24 mg/dL 6.39(H) 5.41(H) 5.56(H)  Sodium 135 - 145 mmol/L 139 139 143  Potassium 3.5 - 5.1 mmol/L 3.3(L) 3.5 3.6  Chloride 98 - 111 mmol/L 101 100 102  CO2 22 - 32 mmol/L 27 29 32  Calcium 8.9 - 10.3 mg/dL 8.4(L) 8.3(L) 8.1(L)  Total Protein 6.5 - 8.1 g/dL - 6.2(L) -  Total Bilirubin 0.3 - 1.2 mg/dL - 1.2 -  Alkaline Phos 38 - 126 U/L - 95 -  AST 15 - 41 U/L - 23 -  ALT 0 - 44 U/L - 19 -       Microbiology: Recent Results (from the past 240 hour(s))  CULTURE, BLOOD (ROUTINE X 2) w Reflex to ID Panel     Status: None (Preliminary result)   Collection Time: 07/08/18  8:49 PM  Result Value Ref Range Status   Specimen Description BLOOD BLOOD RIGHT HAND  Final   Special Requests   Final    BOTTLES DRAWN AEROBIC AND ANAEROBIC Blood Culture results may not be optimal due to an excessive volume of blood received in culture bottles   Culture   Final    NO GROWTH < 12 HOURS Performed at [facility], [address], [place], [place] [address]    Report Status PENDING  Incomplete  CULTURE, BLOOD (ROUTINE X 2) w Reflex to ID Panel     Status: None (Preliminary result)   Collection Time: 07/08/18  8:49 PM  Result Value Ref Range Status   Specimen Description BLOOD FINGER  Final   Special Requests   Final    BOTTLES DRAWN AEROBIC AND ANAEROBIC Blood Culture results may not be optimal due to an excessive volume of blood received in culture bottles   Culture   Final    NO GROWTH < 12 HOURS Performed at [facility], [address] [place], [place] [address]    Report Status PENDING  Incomplete  MRSA PCR Screening     Status: None   Collection Time: 07/09/18 12:33 AM  Result Value Ref Range Status   MRSA by PCR NEGATIVE NEGATIVE Final    Comment:        The GeneXpert MRSA Assay (FDA approved  for NASAL specimens only), is one component of a comprehensive MRSA colonization surveillance program. It is not intended to diagnose MRSA infection nor to guide or monitor treatment for MRSA infections. 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 ?Severe aortitis-progressing inspite of recent 8 weeks of IV antibiotics and then PO Augmentin. This was due to  clostridium septicum bacteremia diagnosed in Sept 2019 due to colon cancer.   C. septicum aortitis has  very poor prognosis and without surgical intervention the mortality is high. As in this patient in spite of 8 weeks of appropriate IV antibiotics there is progression of the disease. He will need surgical intervention-but he is a high risk candidate. Continue vanco and zosyn currently ( may not need vanco if no MRSA in blood culture). ( PCN, unasyn and zosyn should cover C.septicum) The latter two are broad spectrum and covers  gram neg and other anerobes  Colon Cancer- s/p resection and primary anastomosis  ESRD on dialysis  HTN- management as per primary team   ___________________________________________________ Discussed with patient, his son and vascular surgeon

## 2018-07-09 NOTE — Progress Notes (Signed)
This note also relates to the following rows which could not be included: Pulse Rate - Cannot attach notes to unvalidated device data Resp - Cannot attach notes to unvalidated device data  Hd started  

## 2018-07-09 NOTE — Plan of Care (Signed)
  Problem: Health Behavior/Discharge Planning: Goal: Ability to manage health-related needs will improve Outcome: Progressing   Problem: Clinical Measurements: Goal: Ability to maintain clinical measurements within normal limits will improve Outcome: Progressing Goal: Will remain free from infection Outcome: Progressing   

## 2018-07-09 NOTE — Progress Notes (Signed)
ID Full note to follow Pt known to me H/o Clostridium septicum bacteremia isept 27th 2019 , with aortitis diagnosed during that visit. That prompted  colonoscopy which confirmed colon cancer . He underwent hemicolectomy 05/16/18. He got IV antibiotic ( unasyn and then zosyn) until 06/13/18 and plan was to give PO augmentin for indefinite period following that.- He was on Augmentin on discharge on 12/4 . HE was in [facility] for 2 weeks and was not sure what meds he was getting- he was discharged home on 07/05/18. On Friday he came home and took Fleets enema.  He developed pain abdomen, vomiting on Sunday and was admitted on Monday. No fever CT abdomen  Shows worsening aortitits.seen by vascular  Started on zosyn/vanco and solumedrol . Poor  prognosis with ongoing aortitis Await blood cultures Will need indefinite Antibiotic ( which alone not enough to treat severe aortitis- may not be a candidate for aggressive surgery)

## 2018-07-09 NOTE — Progress Notes (Signed)
[facility]  ROUNDING NOTE   Subjective:  Patient readmitted with weakness and flank pain. He was also having some shortness of breath and chest x-ray revealed mild pulmonary edema. Another CT scan of the abdomen and pelvis was performed which showed worsening appearance of the distal aorta, aortic bifurcation and left iliac region likely secondary to acute arteritis.  Objective:  Vital signs in last 24 hours:  Temp:  [97.5 F (36.4 C)-98.7 F (37.1 C)] 98.3 F (36.8 C) (12/24 1000) Pulse Rate:  [73-84] 73 (12/24 1100) Resp:  [11-31] 16 (12/24 1100) BP: (128-214)/(64-92) 165/68 (12/24 1100) SpO2:  [78 %-100 %] 96 % (12/24 1000) Weight:  [89.8 kg-89.9 kg] 89.8 kg (12/24 0018)  Weight change:  Filed Weights   07/08/18 1222 07/09/18 0018  Weight: 89.9 kg 89.8 kg    Intake/Output: I/O last 3 completed shifts: In: 567.6 [IV Piggyback:567.6] Out: -    Intake/Output this shift:  No intake/output data recorded.  Physical Exam: General: No acute distress  Head: Normocephalic, atraumatic. Moist oral mucosal membranes  Eyes: Anicteric  Neck: Supple, trachea midline  Lungs:  Basilar rales, normal effort  Heart: S1S2 no rubs  Abdomen:  Soft, nontender, bowel sounds present  Extremities: trace peripheral edema.  Neurologic: Awake, alert, following commands  Skin: No lesions  Access: LUE AV access    Basic Metabolic Panel: Recent Labs  Lab 07/08/18 1227 07/09/18 0317  NA 139 139  K 3.5 3.3*  CL 100 101  CO2 29 27  GLUCOSE 123* 96  BUN 28* 34*  CREATININE 5.41* 6.39*  CALCIUM 8.3* 8.4*    Liver Function Tests: Recent Labs  Lab 07/08/18 1227  AST 23  ALT 19  ALKPHOS 95  BILITOT 1.2  PROT 6.2*  ALBUMIN 3.4*   Recent Labs  Lab 07/08/18 1227  LIPASE 29   No results for input(s): AMMONIA in the last 168 hours.  CBC: Recent Labs  Lab 07/08/18 1227 07/09/18 0317  WBC 15.4* 15.7*  NEUTROABS 8.7*  --   HGB 8.3* 8.0*  HCT 26.7* 25.2*  MCV  95.4 92.3  PLT 148* 127*    Cardiac Enzymes: No results for input(s): CKTOTAL, CKMB, CKMBINDEX, TROPONINI in the last 168 hours.  BNP: Invalid input(s): POCBNP  CBG: Recent Labs  Lab 07/09/18 0015 07/09/18 0738  GLUCAP 96 74    Microbiology: Results for orders placed or performed during the hospital encounter of 07/08/18  CULTURE, BLOOD (ROUTINE X 2) w Reflex to ID Panel     Status: None (Preliminary result)   Collection Time: 07/08/18  8:49 PM  Result Value Ref Range Status   Specimen Description BLOOD BLOOD RIGHT HAND  Final   Special Requests   Final    BOTTLES DRAWN AEROBIC AND ANAEROBIC Blood Culture results may not be optimal due to an excessive volume of blood received in culture bottles   Culture   Final    NO GROWTH < 12 HOURS Performed at [facility], [address], [place], [place] [address]    Report Status PENDING  Incomplete  CULTURE, BLOOD (ROUTINE X 2) w Reflex to ID Panel     Status: None (Preliminary result)   Collection Time: 07/08/18  8:49 PM  Result Value Ref Range Status   Specimen Description BLOOD FINGER  Final   Special Requests   Final    BOTTLES DRAWN AEROBIC AND ANAEROBIC Blood Culture results may not be optimal due to an excessive volume of blood received in  culture bottles   Culture   Final    NO GROWTH < 12 HOURS Performed at [facility], [address] [place], [place] [address]    Report Status PENDING  Incomplete  MRSA PCR Screening     Status: None   Collection Time: 07/09/18 12:33 AM  Result Value Ref Range Status   MRSA by PCR NEGATIVE NEGATIVE Final    Comment:        The GeneXpert MRSA Assay (FDA approved for NASAL specimens only), is one component of a comprehensive MRSA colonization surveillance program. It is not intended to diagnose MRSA infection nor to guide or monitor treatment for MRSA infections. Performed at [facility], [address] [place], [place] [address]      Coagulation Studies: No results for input(s): LABPROT, INR in the last 72 hours.  Urinalysis: No results for input(s): COLORURINE, LABSPEC, PHURINE, GLUCOSEU, HGBUR, BILIRUBINUR, KETONESUR, PROTEINUR, UROBILINOGEN, NITRITE, LEUKOCYTESUR in the last 72 hours.  Invalid input(s): APPERANCEUR    Imaging: Ct Chest W Contrast  Result Date: 07/08/2018 CLINICAL DATA:  Dialysis patient. Weakness and dizziness. Chest and abdominal pain. EXAM: CT CHEST, ABDOMEN, AND PELVIS WITH CONTRAST TECHNIQUE: Multidetector CT imaging of the chest, abdomen and pelvis was performed following the standard protocol during bolus administration of intravenous contrast. CONTRAST:  139mL ISOVUE-300 IOPAMIDOL (ISOVUE-300) INJECTION 61% COMPARISON:  Radiography same day. Chest CT 04/18/2018. abdominal CT 05/14/2018 FINDINGS: CT CHEST FINDINGS Cardiovascular: Cardiomegaly. Small pericardial effusion. Coronary artery calcification. Aortic valve calcification. Thoracic aortic atherosclerotic calcification. No aneurysm or dissection. Mediastinum/Nodes: No mass or adenopathy. Lungs/Pleura: Bilateral effusions layering dependently with dependent pulmonary atelectasis. Interstitial prominence likely reflecting interstitial edema. No non dependent pathology otherwise. Musculoskeletal: No acute thoracic or rib finding. Ordinary spinal degenerative change. CT ABDOMEN PELVIS FINDINGS Hepatobiliary: Liver parenchyma appears normal. No evidence of cholecystitis. Pancreas: Normal Spleen: Normal Adrenals/Urinary Tract: Adrenal glands are normal. Kidneys are poorly functioning. No evidence of obstruction or mass. Bladder shows wall thickening. Stomach/Bowel: Interval colectomy on the right. No complicating feature seen relative to that. No sign of bowel obstruction. Vascular/Lymphatic: Worsening of the appearance at the distal aorta, aortic bifurcation and left iliac region, with more surrounding extravascular density. Actual low-density  material surrounding the enhancing vessels. As previously, this could be related to acute arteritis, possibly infectious, or to a contained perforation with persistent leakage. Reproductive: Normal except for an enlarged prostate. Other: No free fluid or air. Musculoskeletal: Chronic degenerative changes of the spine, with chronic hemangioma within L2 and chronic endplate Schmorl's nodes. IMPRESSION: 1. Interval colectomy on the right. No complicating feature seen relative to that. 2. Worsening of the appearance at the distal aorta, aortic bifurcation and left iliac region. As previously, this could be related to acute arteritis, possibly infectious, or to a contained perforation with persistent leakage. There is low-density adjacent to the vessel which could represent actual abscess. 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 Infection is the main concern in this case. Congestive heart failure/fluid overload with bilateral effusions, interstitial edema and dependent atelectasis. Electronically Signed   By: [name] M.D.   On: 07/08/2018 15:52   Ct Abdomen Pelvis W Contrast  Result Date: 07/08/2018 CLINICAL DATA:  Dialysis patient. Weakness and dizziness. Chest and abdominal pain. EXAM: CT CHEST, ABDOMEN, AND PELVIS WITH CONTRAST TECHNIQUE: Multidetector CT imaging of the chest, abdomen and pelvis was performed following the standard protocol during bolus administration  of intravenous contrast. CONTRAST:  169mL ISOVUE-300 IOPAMIDOL (ISOVUE-300) INJECTION 61% COMPARISON:  Radiography same day. Chest CT 04/18/2018. abdominal CT 05/14/2018 FINDINGS: CT CHEST FINDINGS Cardiovascular: Cardiomegaly. Small pericardial effusion. Coronary artery calcification. Aortic valve calcification. Thoracic aortic atherosclerotic calcification. No aneurysm or dissection. Mediastinum/Nodes: No mass or adenopathy. Lungs/Pleura:  Bilateral effusions layering dependently with dependent pulmonary atelectasis. Interstitial prominence likely reflecting interstitial edema. No non dependent pathology otherwise. Musculoskeletal: No acute thoracic or rib finding. Ordinary spinal degenerative change. CT ABDOMEN PELVIS FINDINGS Hepatobiliary: Liver parenchyma appears normal. No evidence of cholecystitis. Pancreas: Normal Spleen: Normal Adrenals/Urinary Tract: Adrenal glands are normal. Kidneys are poorly functioning. No evidence of obstruction or mass. Bladder shows wall thickening. Stomach/Bowel: Interval colectomy on the right. No complicating feature seen relative to that. No sign of bowel obstruction. Vascular/Lymphatic: Worsening of the appearance at the distal aorta, aortic bifurcation and left iliac region, with more surrounding extravascular density. Actual low-density material surrounding the enhancing vessels. As previously, this could be related to acute arteritis, possibly infectious, or to a contained perforation with persistent leakage. Reproductive: Normal except for an enlarged prostate. Other: No free fluid or air. Musculoskeletal: Chronic degenerative changes of the spine, with chronic hemangioma within L2 and chronic endplate Schmorl's nodes. IMPRESSION: 1. Interval colectomy on the right. No complicating feature seen relative to that. 2. Worsening of the appearance at the distal aorta, aortic bifurcation and left iliac region. As previously, this could be related to acute arteritis, possibly infectious, or to a contained perforation with persistent leakage. There is low-density adjacent to the vessel which could represent actual abscess. 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 Infection is the main concern in this case. Congestive heart failure/fluid overload with bilateral effusions, interstitial edema and dependent atelectasis.  Electronically Signed   By: [name] M.D.   On: 07/08/2018 15:52   Dg Chest Portable 1 View  Result Date: 07/08/2018 CLINICAL DATA:  Weakness and dizziness EXAM: PORTABLE CHEST 1 VIEW COMPARISON:  06/19/2018 FINDINGS: Moderate cardiomegaly is unchanged. There is mild interstitial pulmonary edema. No pleural effusion or pneumothorax. IMPRESSION: Moderate cardiomegaly with mild interstitial pulmonary edema. Electronically Signed   By: Ulyses Jarred M.D.   On: 07/08/2018 13:41     Medications:   . sodium chloride    . sodium chloride    . piperacillin-tazobactam (ZOSYN)  IV 3.375 g (07/09/18 0850)  . [START ON 07/10/2018] vancomycin     . amLODipine  10 mg Oral Daily  . aspirin EC  81 mg Oral Daily  . carvedilol  12.5 mg Oral BID WC  . Chlorhexidine Gluconate Cloth  6 each Topical Q0600  . cinacalcet  30 mg Oral BH-q7a  . ferric citrate  210 mg Oral Daily  . heparin  5,000 Units Subcutaneous Q8H  . hydrALAZINE  25 mg Oral Q8H  . insulin aspart  0-5 Units Subcutaneous QHS  . insulin aspart  0-9 Units Subcutaneous TID WC  . methylPREDNISolone (SOLU-MEDROL) injection  125 mg Intravenous Daily  . multivitamin  1 tablet Oral Daily   sodium chloride, sodium chloride, acetaminophen **OR** acetaminophen, alteplase, heparin, hydrALAZINE, labetalol, lidocaine (PF), lidocaine-prilocaine, ondansetron **OR** ondansetron (ZOFRAN) IV, pentafluoroprop-tetrafluoroeth, polyethylene glycol  Assessment/ Plan:  77 y.o. male with end stage renal disease on hemodialysis,  hypertension, diabetes mellitus, GERD, anemia of CKD, SHPTH, colon cancer status post resection on 10/31 by Dr. [name], CLL followed by Dr. Janese Banks.   [place]  [place] [facility] MWF L AVF 92kg  1.  ESRD on HD on MWF.  Patient due for dialysis today given alterations in outpatient hemodialysis schedule for the Christmas holidays.  Tolerating dialysis well at the moment.  2.  Anemia chronic kidney disease.  Hemoglobin 8.0.  Start the patient on  Epogen 10,000 units IV with dialysis.  3.  Secondary hyperparathyroidism.  Check serum phosphorus as well as PTH today.  Continue Sensipar as well as Auryxia.  4.  Hypertension.  Maintain the patient on amlodipine, carvedilol, hydralazine.   LOS: 1 Aldahir Litaker 12/24/201911:21 AM

## 2018-07-09 NOTE — Progress Notes (Signed)
Subjective/Chief Complaint: States he feels much better today. Denies abdominal pain. Not as weak as yesterday.   Objective: Vital signs in last 24 hours: Temp:  [97.5 F (36.4 C)-98.7 F (37.1 C)] 98.3 F (36.8 C) (12/24 1000) Pulse Rate:  [73-84] 73 (12/24 1030) Resp:  [13-31] 20 (12/24 1030) BP: (128-214)/(64-92) 165/69 (12/24 1000) SpO2:  [78 %-100 %] 96 % (12/24 1000) Weight:  [89.8 kg-89.9 kg] 89.8 kg (12/24 0018) Last BM Date: 07/08/18  Intake/Output from previous day: 12/23 0701 - 12/24 0700 In: 567.6 [IV Piggyback:567.6] Out: -  Intake/Output this shift: No intake/output data recorded.  General appearance: alert and no distress Cardio: regular rate and rhythm GI: soft, non-tender; bowel sounds normal; no masses,  no organomegaly Extremities: extremities normal, atraumatic, no cyanosis or edema  Lab Results:  Recent Labs    07/08/18 1227 07/09/18 0317  WBC 15.4* 15.7*  HGB 8.3* 8.0*  HCT 26.7* 25.2*  PLT 148* 127*   BMET Recent Labs    07/08/18 1227 07/09/18 0317  NA 139 139  K 3.5 3.3*  CL 100 101  CO2 29 27  GLUCOSE 123* 96  BUN 28* 34*  CREATININE 5.41* 6.39*  CALCIUM 8.3* 8.4*   PT/INR No results for input(s): LABPROT, INR in the last 72 hours. ABG No results for input(s): PHART, HCO3 in the last 72 hours.  Invalid input(s): PCO2, PO2  Studies/Results: Ct Chest W Contrast  Result Date: 07/08/2018 CLINICAL DATA:  Dialysis patient. Weakness and dizziness. Chest and abdominal pain. EXAM: CT CHEST, ABDOMEN, AND PELVIS WITH CONTRAST TECHNIQUE: Multidetector CT imaging of the chest, abdomen and pelvis was performed following the standard protocol during bolus administration of intravenous contrast. CONTRAST:  113mL ISOVUE-300 IOPAMIDOL (ISOVUE-300) INJECTION 61% COMPARISON:  Radiography same day. Chest CT 04/18/2018. abdominal CT 05/14/2018 FINDINGS: CT CHEST FINDINGS Cardiovascular: Cardiomegaly. Small pericardial effusion. Coronary artery  calcification. Aortic valve calcification. Thoracic aortic atherosclerotic calcification. No aneurysm or dissection. Mediastinum/Nodes: No mass or adenopathy. Lungs/Pleura: Bilateral effusions layering dependently with dependent pulmonary atelectasis. Interstitial prominence likely reflecting interstitial edema. No non dependent pathology otherwise. Musculoskeletal: No acute thoracic or rib finding. Ordinary spinal degenerative change. CT ABDOMEN PELVIS FINDINGS Hepatobiliary: Liver parenchyma appears normal. No evidence of cholecystitis. Pancreas: Normal Spleen: Normal Adrenals/Urinary Tract: Adrenal glands are normal. Kidneys are poorly functioning. No evidence of obstruction or mass. Bladder shows wall thickening. Stomach/Bowel: Interval colectomy on the right. No complicating feature seen relative to that. No sign of bowel obstruction. Vascular/Lymphatic: Worsening of the appearance at the distal aorta, aortic bifurcation and left iliac region, with more surrounding extravascular density. Actual low-density material surrounding the enhancing vessels. As previously, this could be related to acute arteritis, possibly infectious, or to a contained perforation with persistent leakage. Reproductive: Normal except for an enlarged prostate. Other: No free fluid or air. Musculoskeletal: Chronic degenerative changes of the spine, with chronic hemangioma within L2 and chronic endplate Schmorl's nodes. IMPRESSION: 1. Interval colectomy on the right. No complicating feature seen relative to that. 2. Worsening of the appearance at the distal aorta, aortic bifurcation and left iliac region. As previously, this could be related to acute arteritis, possibly infectious, or to a contained perforation with persistent leakage. There is low-density adjacent to the vessel which could represent actual abscess. 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 Infection is the main concern in this case. Congestive heart failure/fluid overload with bilateral effusions, interstitial  edema and dependent atelectasis. Electronically Signed   By: [name] M.D.   On: 07/08/2018 15:52   Ct Abdomen Pelvis W Contrast  Result Date: 07/08/2018 CLINICAL DATA:  Dialysis patient. Weakness and dizziness. Chest and abdominal pain. EXAM: CT CHEST, ABDOMEN, AND PELVIS WITH CONTRAST TECHNIQUE: Multidetector CT imaging of the chest, abdomen and pelvis was performed following the standard protocol during bolus administration of intravenous contrast. CONTRAST:  153mL ISOVUE-300 IOPAMIDOL (ISOVUE-300) INJECTION 61% COMPARISON:  Radiography same day. Chest CT 04/18/2018. abdominal CT 05/14/2018 FINDINGS: CT CHEST FINDINGS Cardiovascular: Cardiomegaly. Small pericardial effusion. Coronary artery calcification. Aortic valve calcification. Thoracic aortic atherosclerotic calcification. No aneurysm or dissection. Mediastinum/Nodes: No mass or adenopathy. Lungs/Pleura: Bilateral effusions layering dependently with dependent pulmonary atelectasis. Interstitial prominence likely reflecting interstitial edema. No non dependent pathology otherwise. Musculoskeletal: No acute thoracic or rib finding. Ordinary spinal degenerative change. CT ABDOMEN PELVIS FINDINGS Hepatobiliary: Liver parenchyma appears normal. No evidence of cholecystitis. Pancreas: Normal Spleen: Normal Adrenals/Urinary Tract: Adrenal glands are normal. Kidneys are poorly functioning. No evidence of obstruction or mass. Bladder shows wall thickening. Stomach/Bowel: Interval colectomy on the right. No complicating feature seen relative to that. No sign of bowel obstruction. Vascular/Lymphatic: Worsening of the appearance at the distal aorta, aortic bifurcation and left iliac region, with more surrounding extravascular density. Actual low-density material surrounding the enhancing vessels. As previously, this  could be related to acute arteritis, possibly infectious, or to a contained perforation with persistent leakage. Reproductive: Normal except for an enlarged prostate. Other: No free fluid or air. Musculoskeletal: Chronic degenerative changes of the spine, with chronic hemangioma within L2 and chronic endplate Schmorl's nodes. IMPRESSION: 1. Interval colectomy on the right. No complicating feature seen relative to that. 2. Worsening of the appearance at the distal aorta, aortic bifurcation and left iliac region. As previously, this could be related to acute arteritis, possibly infectious, or to a contained perforation with persistent leakage. There is low-density adjacent to the vessel which could represent actual abscess. 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 Infection is the main concern in this case. Congestive heart failure/fluid overload with bilateral effusions, interstitial edema and dependent atelectasis. Electronically Signed   By: [name] M.D.   On: 07/08/2018 15:52   Dg Chest Portable 1 View  Result Date: 07/08/2018 CLINICAL DATA:  Weakness and dizziness EXAM: PORTABLE CHEST 1 VIEW COMPARISON:  06/19/2018 FINDINGS: Moderate cardiomegaly is unchanged. There is mild interstitial pulmonary edema. No pleural effusion or pneumothorax. IMPRESSION: Moderate cardiomegaly with mild interstitial pulmonary edema. Electronically Signed   By: Ulyses Jarred M.D.   On: 07/08/2018 13:41    Anti-infectives: Anti-infectives (From admission, onward)   Start     Dose/Rate Route Frequency Ordered Stop   07/10/18 1200  vancomycin (VANCOCIN) IVPB 1000 mg/200 mL premix     1,000 mg 200 mL/hr over 60 Minutes Intravenous Every M-W-F (Hemodialysis) 07/08/18 1941     07/08/18 1915  piperacillin-tazobactam (ZOSYN) IVPB 3.375 g     3.375 g 12.5 mL/hr over 240 Minutes Intravenous Every 12 hours 07/08/18 1909      07/08/18 1915  vancomycin (VANCOCIN) 2,000 mg in sodium chloride 0.9 % 500 mL IVPB     2,000 mg 250 mL/hr over 120 Minutes Intravenous  Once 07/08/18 1913 07/08/18 2321      Assessment/Plan:  Aortitis  Continue ABX, Steroids, serial exams.  Await Blood cultures. ID recommendations. Obtain dedicated CTA aorta tomorrow.  Will make further recommendations once repeat CTA complete. However, considering patients comorbidities and overall status; continued ABX, steroids to decrease the inflammatory aortitis then a potential endovascular intervention. The patient would be at high risk for open intervention.     LOS: 1 day    Jamesetta So A 07/09/2018

## 2018-07-09 NOTE — Progress Notes (Signed)
[facility] [place] NAME: Jonathan Mcgrath    MR#:  [id number]  DATE OF BIRTH:  Feb 26, 1941  SUBJECTIVE:   Patient seen in dialysis.  Patient reports no abdominal pain or chest pain.  REVIEW OF SYSTEMS:    Review of Systems  Constitutional: Negative for fever, chills weight loss HENT: Negative for ear pain, nosebleeds, congestion, facial swelling, rhinorrhea, neck pain, neck stiffness and ear discharge.   Respiratory: Negative for cough, shortness of breath, wheezing  Cardiovascular: Negative for chest pain, palpitations and leg swelling.  Gastrointestinal: Negative for heartburn, abdominal pain, vomiting, diarrhea or consitpation Genitourinary: Negative for dysuria, urgency, frequency, hematuria Musculoskeletal: Negative for back pain or joint pain Neurological: Negative for dizziness, seizures, syncope, focal weakness,  numbness and headaches.  Hematological: Does not bruise/bleed easily.  Psychiatric/Behavioral: Negative for hallucinations, confusion, dysphoric mood    Tolerating Diet: yes      DRUG ALLERGIES:  No Known Allergies  VITALS:  Blood pressure (!) 154/66, pulse 73, temperature 98.3 F (36.8 C), temperature source Oral, resp. rate 17, height 6\' 1"  (1.854 m), weight 89.8 kg, SpO2 96 %.  PHYSICAL EXAMINATION:  Constitutional: Appears well-developed and well-nourished. No distress. HENT: Normocephalic. Marland Kitchen Oropharynx is clear and moist.  Eyes: Conjunctivae and EOM are normal. PERRLA, no scleral icterus.  Neck: Normal ROM. Neck supple. No JVD. No tracheal deviation. CVS: RRR, S1/S2 +, no murmurs, no gallops, no carotid bruit.  Pulmonary: Effort and breath sounds normal, no stridor, rhonchi, wheezes, rales.  Abdominal: Soft. BS +,  no distension, tenderness, rebound or guarding.  Musculoskeletal: Normal range of motion. No edema and no tenderness.  Neuro: Alert. CN 2-12 grossly intact. No focal deficits. Skin: Skin is  warm and dry. No rash noted. Psychiatric: Normal mood and affect.      LABORATORY PANEL:   CBC Recent Labs  Lab 07/09/18 0317  WBC 15.7*  HGB 8.0*  HCT 25.2*  PLT 127*   ------------------------------------------------------------------------------------------------------------------  Chemistries  Recent Labs  Lab 07/08/18 1227 07/09/18 0317  NA 139 139  K 3.5 3.3*  CL 100 101  CO2 29 27  GLUCOSE 123* 96  BUN 28* 34*  CREATININE 5.41* 6.39*  CALCIUM 8.3* 8.4*  AST 23  --   ALT 19  --   ALKPHOS 95  --   BILITOT 1.2  --    ------------------------------------------------------------------------------------------------------------------  Cardiac Enzymes No results for input(s): TROPONINI in the last 168 hours. ------------------------------------------------------------------------------------------------------------------  RADIOLOGY:  Ct Chest W Contrast  Result Date: 07/08/2018 CLINICAL DATA:  Dialysis patient. Weakness and dizziness. Chest and abdominal pain. EXAM: CT CHEST, ABDOMEN, AND PELVIS WITH CONTRAST TECHNIQUE: Multidetector CT imaging of the chest, abdomen and pelvis was performed following the standard protocol during bolus administration of intravenous contrast. CONTRAST:  116mL ISOVUE-300 IOPAMIDOL (ISOVUE-300) INJECTION 61% COMPARISON:  Radiography same day. Chest CT 04/18/2018. abdominal CT 05/14/2018 FINDINGS: CT CHEST FINDINGS Cardiovascular: Cardiomegaly. Small pericardial effusion. Coronary artery calcification. Aortic valve calcification. Thoracic aortic atherosclerotic calcification. No aneurysm or dissection. Mediastinum/Nodes: No mass or adenopathy. Lungs/Pleura: Bilateral effusions layering dependently with dependent pulmonary atelectasis. Interstitial prominence likely reflecting interstitial edema. No non dependent pathology otherwise. Musculoskeletal: No acute thoracic or rib finding. Ordinary spinal degenerative change. CT ABDOMEN PELVIS  FINDINGS Hepatobiliary: Liver parenchyma appears normal. No evidence of cholecystitis. Pancreas: Normal Spleen: Normal Adrenals/Urinary Tract: Adrenal glands are normal. Kidneys are poorly functioning. No evidence of obstruction or mass. Bladder shows wall thickening. Stomach/Bowel: Interval colectomy on the right.  No complicating feature seen relative to that. No sign of bowel obstruction. Vascular/Lymphatic: Worsening of the appearance at the distal aorta, aortic bifurcation and left iliac region, with more surrounding extravascular density. Actual low-density material surrounding the enhancing vessels. As previously, this could be related to acute arteritis, possibly infectious, or to a contained perforation with persistent leakage. Reproductive: Normal except for an enlarged prostate. Other: No free fluid or air. Musculoskeletal: Chronic degenerative changes of the spine, with chronic hemangioma within L2 and chronic endplate Schmorl's nodes. IMPRESSION: 1. Interval colectomy on the right. No complicating feature seen relative to that. 2. Worsening of the appearance at the distal aorta, aortic bifurcation and left iliac region. As previously, this could be related to acute arteritis, possibly infectious, or to a contained perforation with persistent leakage. There is low-density adjacent to the vessel which could represent actual abscess. 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 Infection is the main concern in this case. Congestive heart failure/fluid overload with bilateral effusions, interstitial edema and dependent atelectasis. Electronically Signed   By: [name] M.D.   On: 07/08/2018 15:52   Ct Abdomen Pelvis W Contrast  Result Date: 07/08/2018 CLINICAL DATA:  Dialysis patient. Weakness and dizziness. Chest and abdominal pain. EXAM: CT CHEST, ABDOMEN, AND PELVIS WITH CONTRAST TECHNIQUE:  Multidetector CT imaging of the chest, abdomen and pelvis was performed following the standard protocol during bolus administration of intravenous contrast. CONTRAST:  125mL ISOVUE-300 IOPAMIDOL (ISOVUE-300) INJECTION 61% COMPARISON:  Radiography same day. Chest CT 04/18/2018. abdominal CT 05/14/2018 FINDINGS: CT CHEST FINDINGS Cardiovascular: Cardiomegaly. Small pericardial effusion. Coronary artery calcification. Aortic valve calcification. Thoracic aortic atherosclerotic calcification. No aneurysm or dissection. Mediastinum/Nodes: No mass or adenopathy. Lungs/Pleura: Bilateral effusions layering dependently with dependent pulmonary atelectasis. Interstitial prominence likely reflecting interstitial edema. No non dependent pathology otherwise. Musculoskeletal: No acute thoracic or rib finding. Ordinary spinal degenerative change. CT ABDOMEN PELVIS FINDINGS Hepatobiliary: Liver parenchyma appears normal. No evidence of cholecystitis. Pancreas: Normal Spleen: Normal Adrenals/Urinary Tract: Adrenal glands are normal. Kidneys are poorly functioning. No evidence of obstruction or mass. Bladder shows wall thickening. Stomach/Bowel: Interval colectomy on the right. No complicating feature seen relative to that. No sign of bowel obstruction. Vascular/Lymphatic: Worsening of the appearance at the distal aorta, aortic bifurcation and left iliac region, with more surrounding extravascular density. Actual low-density material surrounding the enhancing vessels. As previously, this could be related to acute arteritis, possibly infectious, or to a contained perforation with persistent leakage. Reproductive: Normal except for an enlarged prostate. Other: No free fluid or air. Musculoskeletal: Chronic degenerative changes of the spine, with chronic hemangioma within L2 and chronic endplate Schmorl's nodes. IMPRESSION: 1. Interval colectomy on the right. No complicating feature seen relative to that. 2. Worsening of the appearance  at the distal aorta, aortic bifurcation and left iliac region. As previously, this could be related to acute arteritis, possibly infectious, or to a contained perforation with persistent leakage. There is low-density adjacent to the vessel which could represent actual abscess. 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 Infection is the main concern in this case. Congestive heart failure/fluid overload with bilateral effusions, interstitial edema and dependent atelectasis. Electronically Signed   By: [name] M.D.   On: 07/08/2018 15:52   Dg Chest Portable 1 View  Result Date: 07/08/2018 CLINICAL DATA:  Weakness and dizziness EXAM:  PORTABLE CHEST 1 VIEW COMPARISON:  06/19/2018 FINDINGS: Moderate cardiomegaly is unchanged. There is mild interstitial pulmonary edema. No pleural effusion or pneumothorax. IMPRESSION: Moderate cardiomegaly with mild interstitial pulmonary edema. Electronically Signed   By: Ulyses Jarred M.D.   On: 07/08/2018 13:41     ASSESSMENT AND PLAN:   77 year old male with a history of colon cancer status post hemicolectomy October 2019, diabetes and end-stage renal disease on hemodialysis who presented to the emergency room due to abdominal pain.  1.  Aortitis: This is etiology of patient's abdominal pain.  Patient evaluated by vascular surgery.  Patient high risk for surgical intervention.  Recommendations are to continue broad-spectrum antibiotics and steroids. ID consultation pending Continue vancomycin, Zosyn and Solu-Medrol Repeat CTA tomorrow  2.  Acute hypoxic respiratory failure due to acute diastolic heart failure and volume overload due to end-stage renal disease Wean oxygen as tolerated  3.  End-stage renal disease on hemodialysis: Continue dialysis as per recommendations by nephrology  4.  Essential hypertension: Blood pressure ideally should be systolic less  than 195 per vascular surgery recommendations. Continue Norvasc and increased dose of Coreg Add hydralazine  5.  Diabetes: Continue sliding scale 6.  Anemia chronic disease with stable hemoglobin Continue iron supplement     Management plans discussed with the patient and he is in agreement.  CODE STATUS: Partial/DNI  TOTAL TIME TAKING CARE OF THIS PATIENT: 30 minutes.     POSSIBLE D/C 3 to 5 days, DEPENDING ON CLINICAL CONDITION.   Kolsen Choe M.D on 07/09/2018 at 12:01 PM  Between 7am to 6pm - Pager - [phone] After 6pm go to [email or web address] - password [email or web address]  Office  [phone]  CC: Primary care physician; [employer]  Note: This dictation was prepared with Diplomatic Services operational officer dictation along with smaller Company secretary. Any transcriptional errors that result from this process are unintentional.

## 2018-07-10 ENCOUNTER — Inpatient Hospital Stay: Payer: Medicare HMO

## 2018-07-10 DIAGNOSIS — C189 Malignant neoplasm of colon, unspecified: Secondary | ICD-10-CM

## 2018-07-10 LAB — GLUCOSE, CAPILLARY
Glucose-Capillary: 149 mg/dL — ABNORMAL HIGH (ref 70–99)
Glucose-Capillary: 181 mg/dL — ABNORMAL HIGH (ref 70–99)
Glucose-Capillary: 195 mg/dL — ABNORMAL HIGH (ref 70–99)
Glucose-Capillary: 193 mg/dL — ABNORMAL HIGH (ref 70–99)

## 2018-07-10 LAB — BASIC METABOLIC PANEL
Anion gap: 14 (ref 5–15)
BUN: 32 mg/dL — ABNORMAL HIGH (ref 8–23)
CO2: 28 mmol/L (ref 22–32)
Calcium: 8.2 mg/dL — ABNORMAL LOW (ref 8.9–10.3)
Chloride: 97 mmol/L — ABNORMAL LOW (ref 98–111)
Creatinine, Ser: 5.19 mg/dL — ABNORMAL HIGH (ref 0.61–1.24)
GFR calc Af Amer: 11 mL/min — ABNORMAL LOW (ref >60)
GFR calc non Af Amer: 10 mL/min — ABNORMAL LOW (ref >60)
Glucose, Bld: 159 mg/dL — ABNORMAL HIGH (ref 70–99)
Potassium: 3.7 mmol/L (ref 3.5–5.1)
Sodium: 139 mmol/L (ref 135–145)

## 2018-07-10 MED ORDER — SODIUM CHLORIDE 0.9 % IV SOLN
3.0000 g | INTRAVENOUS | Status: DC
  Administered 2018-07-10 – 2018-07-14 (×5): 3 g via INTRAVENOUS
  Filled 2018-07-10 (×7): qty 3

## 2018-07-10 MED ORDER — SODIUM CHLORIDE 0.9 % IV SOLN
3.0000 g | Freq: Two times a day (BID) | INTRAVENOUS | Status: DC
  Filled 2018-07-10 (×2): qty 3

## 2018-07-10 MED ORDER — FERRIC CITRATE 1 GM 210 MG(FE) PO TABS
210.0000 mg | ORAL_TABLET | Freq: Three times a day (TID) | ORAL | Status: DC
  Administered 2018-07-10 – 2018-07-18 (×10): 210 mg via ORAL
  Filled 2018-07-10 (×26): qty 1

## 2018-07-10 MED ORDER — IOPAMIDOL (ISOVUE-370) INJECTION 76%
100.0000 mL | Freq: Once | INTRAVENOUS | Status: AC | PRN
  Administered 2018-07-10: 100 mL via INTRAVENOUS

## 2018-07-10 NOTE — Progress Notes (Signed)
[facility]  ROUNDING NOTE   Subjective:  Patient completed hemodialysis yesterday. Tolerated well. Next scheduled dialysis for Friday.   Objective:  Vital signs in last 24 hours:  Temp:  [97.7 F (36.5 C)-98.1 F (36.7 C)] 97.7 F (36.5 C) (12/25 0739) Pulse Rate:  [69-75] 69 (12/25 0739) Resp:  [16-20] 19 (12/25 0739) BP: (130-152)/(60-71) 137/64 (12/25 0739) SpO2:  [90 %-97 %] 90 % (12/25 0739) Weight:  [89.2 kg] 89.2 kg (12/25 0426)  Weight change: -0.678 kg Filed Weights   07/08/18 1222 07/09/18 0018 07/10/18 0426  Weight: 89.9 kg 89.8 kg 89.2 kg    Intake/Output: I/O last 3 completed shifts: In: 611.7 [IV Piggyback:611.7] Out: 1500 [Other:1500]   Intake/Output this shift:  Total I/O In: 480 [P.O.:480] Out: -   Physical Exam: General: No acute distress  Head: Normocephalic, atraumatic. Moist oral mucosal membranes  Eyes: Anicteric  Neck: Supple, trachea midline  Lungs:  Basilar rales, normal effort  Heart: S1S2 no rubs  Abdomen:  Soft, nontender, bowel sounds present  Extremities: trace peripheral edema.  Neurologic: Awake, alert, following commands  Skin: No lesions  Access: LUE AV access    Basic Metabolic Panel: Recent Labs  Lab 07/08/18 1227 07/09/18 0317 07/10/18 0454  NA 139 139 139  K 3.5 3.3* 3.7  CL 100 101 97*  CO2 29 27 28   GLUCOSE 123* 96 159*  BUN 28* 34* 32*  CREATININE 5.41* 6.39* 5.19*  CALCIUM 8.3* 8.4* 8.2*    Liver Function Tests: Recent Labs  Lab 07/08/18 1227  AST 23  ALT 19  ALKPHOS 95  BILITOT 1.2  PROT 6.2*  ALBUMIN 3.4*   Recent Labs  Lab 07/08/18 1227  LIPASE 29   No results for input(s): AMMONIA in the last 168 hours.  CBC: Recent Labs  Lab 07/08/18 1227 07/09/18 0317  WBC 15.4* 15.7*  NEUTROABS 8.7*  --   HGB 8.3* 8.0*  HCT 26.7* 25.2*  MCV 95.4 92.3  PLT 148* 127*    Cardiac Enzymes: No results for input(s): CKTOTAL, CKMB, CKMBINDEX, TROPONINI in the last 168  hours.  BNP: Invalid input(s): POCBNP  CBG: Recent Labs  Lab 07/09/18 0738 07/09/18 1702 07/09/18 2057 07/10/18 0740 07/10/18 1147  GLUCAP 74 135* 168* 149* 181*    Microbiology: Results for orders placed or performed during the hospital encounter of 07/08/18  CULTURE, BLOOD (ROUTINE X 2) w Reflex to ID Panel     Status: None (Preliminary result)   Collection Time: 07/08/18  8:49 PM  Result Value Ref Range Status   Specimen Description BLOOD BLOOD RIGHT HAND  Final   Special Requests   Final    BOTTLES DRAWN AEROBIC AND ANAEROBIC Blood Culture results may not be optimal due to an excessive volume of blood received in culture bottles   Culture   Final    NO GROWTH 2 DAYS Performed at [facility], [address], [place], [place] [address]    Report Status PENDING  Incomplete  CULTURE, BLOOD (ROUTINE X 2) w Reflex to ID Panel     Status: None (Preliminary result)   Collection Time: 07/08/18  8:49 PM  Result Value Ref Range Status   Specimen Description BLOOD FINGER  Final   Special Requests   Final    BOTTLES DRAWN AEROBIC AND ANAEROBIC Blood Culture results may not be optimal due to an excessive volume of blood received in culture bottles   Culture   Final    NO GROWTH  2 DAYS Performed at [facility], [address] [place], [place] [address]    Report Status PENDING  Incomplete  MRSA PCR Screening     Status: None   Collection Time: 07/09/18 12:33 AM  Result Value Ref Range Status   MRSA by PCR NEGATIVE NEGATIVE Final    Comment:        The GeneXpert MRSA Assay (FDA approved for NASAL specimens only), is one component of a comprehensive MRSA colonization surveillance program. It is not intended to diagnose MRSA infection nor to guide or monitor treatment for MRSA infections. Performed at [facility], [address] [place], [place] [address]     Coagulation Studies: No results for input(s): LABPROT, INR in the last 72  hours.  Urinalysis: No results for input(s): COLORURINE, LABSPEC, PHURINE, GLUCOSEU, HGBUR, BILIRUBINUR, KETONESUR, PROTEINUR, UROBILINOGEN, NITRITE, LEUKOCYTESUR in the last 72 hours.  Invalid input(s): APPERANCEUR    Imaging: Ct Chest W Contrast  Result Date: 07/08/2018 CLINICAL DATA:  Dialysis patient. Weakness and dizziness. Chest and abdominal pain. EXAM: CT CHEST, ABDOMEN, AND PELVIS WITH CONTRAST TECHNIQUE: Multidetector CT imaging of the chest, abdomen and pelvis was performed following the standard protocol during bolus administration of intravenous contrast. CONTRAST:  113mL ISOVUE-300 IOPAMIDOL (ISOVUE-300) INJECTION 61% COMPARISON:  Radiography same day. Chest CT 04/18/2018. abdominal CT 05/14/2018 FINDINGS: CT CHEST FINDINGS Cardiovascular: Cardiomegaly. Small pericardial effusion. Coronary artery calcification. Aortic valve calcification. Thoracic aortic atherosclerotic calcification. No aneurysm or dissection. Mediastinum/Nodes: No mass or adenopathy. Lungs/Pleura: Bilateral effusions layering dependently with dependent pulmonary atelectasis. Interstitial prominence likely reflecting interstitial edema. No non dependent pathology otherwise. Musculoskeletal: No acute thoracic or rib finding. Ordinary spinal degenerative change. CT ABDOMEN PELVIS FINDINGS Hepatobiliary: Liver parenchyma appears normal. No evidence of cholecystitis. Pancreas: Normal Spleen: Normal Adrenals/Urinary Tract: Adrenal glands are normal. Kidneys are poorly functioning. No evidence of obstruction or mass. Bladder shows wall thickening. Stomach/Bowel: Interval colectomy on the right. No complicating feature seen relative to that. No sign of bowel obstruction. Vascular/Lymphatic: Worsening of the appearance at the distal aorta, aortic bifurcation and left iliac region, with more surrounding extravascular density. Actual low-density material surrounding the enhancing vessels. As previously, this could be related to  acute arteritis, possibly infectious, or to a contained perforation with persistent leakage. Reproductive: Normal except for an enlarged prostate. Other: No free fluid or air. Musculoskeletal: Chronic degenerative changes of the spine, with chronic hemangioma within L2 and chronic endplate Schmorl's nodes. IMPRESSION: 1. Interval colectomy on the right. No complicating feature seen relative to that. 2. Worsening of the appearance at the distal aorta, aortic bifurcation and left iliac region. As previously, this could be related to acute arteritis, possibly infectious, or to a contained perforation with persistent leakage. There is low-density adjacent to the vessel which could represent actual abscess. 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 Infection is the main concern in this case. Congestive heart failure/fluid overload with bilateral effusions, interstitial edema and dependent atelectasis. Electronically Signed   By: [name] M.D.   On: 07/08/2018 15:52   Ct Abdomen Pelvis W Contrast  Result Date: 07/08/2018 CLINICAL DATA:  Dialysis patient. Weakness and dizziness. Chest and abdominal pain. EXAM: CT CHEST, ABDOMEN, AND PELVIS WITH CONTRAST TECHNIQUE: Multidetector CT imaging of the chest, abdomen and pelvis was performed following the standard protocol during bolus administration of intravenous contrast. CONTRAST:  159mL ISOVUE-300 IOPAMIDOL (ISOVUE-300) INJECTION 61% COMPARISON:  Radiography  same day. Chest CT 04/18/2018. abdominal CT 05/14/2018 FINDINGS: CT CHEST FINDINGS Cardiovascular: Cardiomegaly. Small pericardial effusion. Coronary artery calcification. Aortic valve calcification. Thoracic aortic atherosclerotic calcification. No aneurysm or dissection. Mediastinum/Nodes: No mass or adenopathy. Lungs/Pleura: Bilateral effusions layering dependently with dependent pulmonary atelectasis.  Interstitial prominence likely reflecting interstitial edema. No non dependent pathology otherwise. Musculoskeletal: No acute thoracic or rib finding. Ordinary spinal degenerative change. CT ABDOMEN PELVIS FINDINGS Hepatobiliary: Liver parenchyma appears normal. No evidence of cholecystitis. Pancreas: Normal Spleen: Normal Adrenals/Urinary Tract: Adrenal glands are normal. Kidneys are poorly functioning. No evidence of obstruction or mass. Bladder shows wall thickening. Stomach/Bowel: Interval colectomy on the right. No complicating feature seen relative to that. No sign of bowel obstruction. Vascular/Lymphatic: Worsening of the appearance at the distal aorta, aortic bifurcation and left iliac region, with more surrounding extravascular density. Actual low-density material surrounding the enhancing vessels. As previously, this could be related to acute arteritis, possibly infectious, or to a contained perforation with persistent leakage. Reproductive: Normal except for an enlarged prostate. Other: No free fluid or air. Musculoskeletal: Chronic degenerative changes of the spine, with chronic hemangioma within L2 and chronic endplate Schmorl's nodes. IMPRESSION: 1. Interval colectomy on the right. No complicating feature seen relative to that. 2. Worsening of the appearance at the distal aorta, aortic bifurcation and left iliac region. As previously, this could be related to acute arteritis, possibly infectious, or to a contained perforation with persistent leakage. There is low-density adjacent to the vessel which could represent actual abscess. 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 Infection is the main concern in this case. Congestive heart failure/fluid overload with bilateral effusions, interstitial edema and dependent atelectasis. Electronically Signed   By: [name] M.D.   On: 07/08/2018 15:52   Ct Angio  Abd/pel W/ And/or W/o  Result Date: 07/10/2018 CLINICAL DATA:  Aortoiliac bifurcation infectious arteritis EXAM: CT ANGIOGRAPHY ABDOMEN AND PELVIS WITH CONTRAST AND WITHOUT CONTRAST TECHNIQUE: Multidetector CT imaging of the abdomen and pelvis was performed using the standard protocol during bolus administration of intravenous contrast. Multiplanar reconstructed images and MIPs were obtained and reviewed to evaluate the vascular anatomy. CONTRAST:  195mL ISOVUE-370 IOPAMIDOL (ISOVUE-370) INJECTION 76% COMPARISON:  04/15/2018, 05/14/2018, 07/08/2018 FINDINGS: VASCULAR Aorta: Atherosclerosis of the abdominal aorta. Negative for significant abdominal aneurysm or dissection. Distal aortic bifurcation surrounding strandy edema/inflammation, unchanged. No thrombus or occlusion. Celiac: Atherosclerotic origin with minor narrowing. No significant stenosis. Celiac remains patent including its branches SMA: Atherosclerotic origin but remains patent including its branches Renals: Minor atherosclerosis without significant stenosis by CTA. Main renal arteries remain patent. No accessory renal artery. IMA: Remains patent off the distal aorta including its branches Inflow: Left common iliac region perivascular strandy edema/inflammation persists with 2 adjacent areas of similar-sized pseudoaneurysm formation/contained perforations. There is similar perivascular ill-defined low-attenuation fluid measuring 2.5 x 2.0 cm suspicious for perivascular developing fluid collection or abscess in the setting of infectious arteritis. No significant interval change, developing dissection, thrombus, occlusion, or enlarging hematoma. Pelvic iliac vessels are atherosclerotic and tortuous but remain patent. No inflow disease or occlusion. Proximal Outflow: Common, profunda femoral, and superficial femoral arteries demonstrated all remain patent. Veins: No definite associated veno-occlusive process. Review of the MIP images confirms the above  findings. NON-VASCULAR Lower chest: Similar dependent small to moderate pleural effusions with compressive bibasilar atelectasis. Small pericardial effusion. Heart remains enlarged. Small hiatal hernia. Hepatobiliary: No focal liver abnormality is seen. No  gallstones, gallbladder wall thickening, or biliary dilatation. Pancreas: Unremarkable. No pancreatic ductal dilatation or surrounding inflammatory changes. Spleen: Normal in size without focal abnormality. Adrenals/Urinary Tract: Normal adrenal glands. Renal vascular calcifications noted. Suspect left parapelvic hypodense renal cyst measuring 1.5 cm. No renal obstruction or hydronephrosis. No hydroureter. Bladder is thick-walled under distended. Stomach/Bowel: Negative for bowel obstruction, significant dilatation, ileus, free air. Remote right hemicolectomy. Lymphatic: Small para-aortic lymph nodes, suspect reactive. No bulky adenopathy. Reproductive: Prostate gland is enlarged. Seminal vesicles are symmetric. Other: Intact abdominal wall.  No large volume ascites. Musculoskeletal: Degenerative changes of the spine. Diffuse body anasarca. IMPRESSION: VASCULAR Similar appearance of the left common iliac vascular abnormality concerning for pseudoaneurysm formation/contained perforations with adjacent perivascular developing abscess measuring up to 2.5 cm. Appearance compatible with infectious arteritis, unchanged when compared to 07/08/2018. However, this process has progressed compared 05/14/2018. NON-VASCULAR Similar pleural effusions and basilar atelectasis. Small pericardial effusion. Small hiatal hernia Remote right hemicolectomy without bowel obstruction or perforation Nonspecific bladder wall thickening Electronically Signed   By: Jerilynn Mages.  Shick M.D.   On: 07/10/2018 09:46     Medications:   . ampicillin-sulbactam (UNASYN) IV     . amLODipine  10 mg Oral Daily  . aspirin EC  Jonathan mg Oral Daily  . carvedilol  12.5 mg Oral BID WC  . Chlorhexidine  Gluconate Cloth  6 each Topical Q0600  . cinacalcet  30 mg Oral BH-q7a  . epoetin (EPOGEN/PROCRIT) injection  10,000 Units Intravenous Q M,W,F-HD  . ferric citrate  210 mg Oral Daily  . heparin  5,000 Units Subcutaneous Q8H  . hydrALAZINE  25 mg Oral Q8H  . insulin aspart  0-5 Units Subcutaneous QHS  . insulin aspart  0-9 Units Subcutaneous TID WC  . methylPREDNISolone (SOLU-MEDROL) injection  125 mg Intravenous Daily  . multivitamin  1 tablet Oral Daily   acetaminophen **OR** acetaminophen, hydrALAZINE, labetalol, ondansetron **OR** ondansetron (ZOFRAN) IV, polyethylene glycol  Assessment/ Plan:  77 y.o. Mcgrath with end stage renal disease on hemodialysis,  hypertension, diabetes mellitus, GERD, anemia of CKD, SHPTH, colon cancer status post resection on 10/31 by Dr. [name], CLL followed by Dr. Janese Banks.   [facility] MWF L AVF 92kg  1.  ESRD on HD on MWF.  Next dialysis treatment scheduled for Friday.  2.  Anemia chronic kidney disease.  Continue Epogen 10,000 units IV with dialysis.  3.  Secondary hyperparathyroidism.  Maintain the patient on Sensipar as well as Auryxia.  Change Auryxia to 210 mg p.o. 3 times daily with meals.  4.  Hypertension.  Maintain the patient on amlodipine, carvedilol, hydralazine.  Blood pressure currently except low at 137/64.   LOS: 2 Camillia Marcy 12/25/20191:53 PM

## 2018-07-10 NOTE — Progress Notes (Signed)
Date of Admission:  07/08/2018   Total days of antibiotics 3         ID: Jonathan Mcgrath is a 77 y.o. 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 amLODipine  10 mg Oral Daily  . aspirin EC  81 mg Oral Daily  . carvedilol  12.5 mg Oral BID WC  . Chlorhexidine Gluconate Cloth  6 each Topical Q0600  . cinacalcet  30 mg Oral BH-q7a  . epoetin (EPOGEN/PROCRIT) injection  10,000 Units Intravenous Q M,W,F-HD  . ferric citrate  210 mg Oral Daily  . heparin  5,000 Units Subcutaneous Q8H  . hydrALAZINE  25 mg Oral Q8H  . insulin aspart  0-5 Units Subcutaneous QHS  . insulin aspart  0-9 Units Subcutaneous TID WC  . methylPREDNISolone (SOLU-MEDROL) injection  125 mg Intravenous Daily  . multivitamin  1 tablet Oral Daily    Objective: Vital signs in last 24 hours: Temp:  [97.7 F (36.5 C)-98.1 F (36.7 C)] 97.7 F (36.5 C) (12/25 0739) Pulse Rate:  [64-75] 69 (12/25 0739) Resp:  [14-20] 19 (12/25 0739) BP: (130-152)/(60-71) 137/64 (12/25 0739) SpO2:  [90 %-97 %] 90 % (12/25 0739) Weight:  [89.2 kg] 89.2 kg (12/25 0426)  PHYSICAL EXAM:  General: Alert, cooperative, no distress, appears stated age.  Head: Normocephalic, without obvious abnormality, atraumatic. Eyes: Conjunctivae clear, anicteric sclerae. Pupils are equal ENT Nares normal. No drainage or sinus tenderness. Lips, mucosa, and tongue normal. No Thrush Neck: Supple, symmetrical, no adenopathy, thyroid: non tender no carotid bruit and no JVD. Back: No CVA tenderness. Lungs: Clear to auscultation bilaterally. No Wheezing or Rhonchi. No rales. Heart: Regular rate and rhythm, no murmur, rub or gallop. Abdomen: Soft, non-tender,not distended. Bowel sounds normal. No masses Extremities: atraumatic, no cyanosis. No edema. No clubbing Skin:  No rashes or lesions. Or bruising Lymph: Cervical, supraclavicular normal. Neurologic: Grossly non-focal  Lab Results Recent Labs    07/08/18 1227 07/09/18 0317 07/10/18 0454  WBC 15.4* 15.7*  --   HGB 8.3* 8.0*  --   HCT 26.7* 25.2*  --   NA 139 139 139  K 3.5 3.3* 3.7  CL 100 101 97*  CO2 29 27 28   BUN 28* 34* 32*  CREATININE 5.41* 6.39* 5.19*   Liver Panel Recent Labs    07/08/18 1227  PROT 6.2*  ALBUMIN 3.4*  AST 23  ALT 19  ALKPHOS 95  BILITOT 1.2   Sedimentation Rate No results for input(s): ESRSEDRATE in the last 72 hours. C-Reactive Protein No results for input(s): CRP in the last 72 hours.  Microbiology: BC-NG Studies/Results: Ct Chest W Contrast  Result Date: 07/08/2018 CLINICAL DATA:  Dialysis patient. Weakness and dizziness. Chest and abdominal pain. EXAM: CT CHEST, ABDOMEN, AND PELVIS WITH CONTRAST TECHNIQUE: Multidetector CT imaging of the chest, abdomen and pelvis was performed following the standard protocol during bolus administration of intravenous contrast. CONTRAST:  173mL ISOVUE-300 IOPAMIDOL (ISOVUE-300) INJECTION 61% COMPARISON:  Radiography same day. Chest CT 04/18/2018. abdominal CT 05/14/2018 FINDINGS: CT CHEST FINDINGS Cardiovascular: Cardiomegaly. Small pericardial effusion. Coronary artery calcification. Aortic valve calcification. Thoracic aortic atherosclerotic calcification. No aneurysm or dissection. Mediastinum/Nodes: No mass or adenopathy. Lungs/Pleura: Bilateral effusions layering dependently with dependent pulmonary atelectasis. Interstitial prominence likely reflecting interstitial edema. No non dependent pathology otherwise. Musculoskeletal: No acute thoracic  or rib finding. Ordinary spinal degenerative change. CT ABDOMEN PELVIS FINDINGS Hepatobiliary: Liver parenchyma appears normal. No evidence of cholecystitis. Pancreas: Normal Spleen: Normal Adrenals/Urinary Tract: Adrenal glands are normal. Kidneys are poorly functioning. No  evidence of obstruction or mass. Bladder shows wall thickening. Stomach/Bowel: Interval colectomy on the right. No complicating feature seen relative to that. No sign of bowel obstruction. Vascular/Lymphatic: Worsening of the appearance at the distal aorta, aortic bifurcation and left iliac region, with more surrounding extravascular density. Actual low-density material surrounding the enhancing vessels. As previously, this could be related to acute arteritis, possibly infectious, or to a contained perforation with persistent leakage. Reproductive: Normal except for an enlarged prostate. Other: No free fluid or air. Musculoskeletal: Chronic degenerative changes of the spine, with chronic hemangioma within L2 and chronic endplate Schmorl's nodes. IMPRESSION: 1. Interval colectomy on the right. No complicating feature seen relative to that. 2. Worsening of the appearance at the distal aorta, aortic bifurcation and left iliac region. As previously, this could be related to acute arteritis, possibly infectious, or to a contained perforation with persistent leakage. There is low-density adjacent to the vessel which could represent actual abscess. 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 Infection is the main concern in this case. Congestive heart failure/fluid overload with bilateral effusions, interstitial edema and dependent atelectasis. Electronically Signed   By: [name] M.D.   On: 07/08/2018 15:52   Ct Abdomen Pelvis W Contrast  Result Date: 07/08/2018 CLINICAL DATA:  Dialysis patient. Weakness and dizziness. Chest and abdominal pain. EXAM: CT CHEST, ABDOMEN, AND PELVIS WITH CONTRAST TECHNIQUE: Multidetector CT imaging of the chest, abdomen and pelvis was performed following the standard protocol during bolus administration of intravenous contrast. CONTRAST:  138mL ISOVUE-300 IOPAMIDOL (ISOVUE-300) INJECTION  61% COMPARISON:  Radiography same day. Chest CT 04/18/2018. abdominal CT 05/14/2018 FINDINGS: CT CHEST FINDINGS Cardiovascular: Cardiomegaly. Small pericardial effusion. Coronary artery calcification. Aortic valve calcification. Thoracic aortic atherosclerotic calcification. No aneurysm or dissection. Mediastinum/Nodes: No mass or adenopathy. Lungs/Pleura: Bilateral effusions layering dependently with dependent pulmonary atelectasis. Interstitial prominence likely reflecting interstitial edema. No non dependent pathology otherwise. Musculoskeletal: No acute thoracic or rib finding. Ordinary spinal degenerative change. CT ABDOMEN PELVIS FINDINGS Hepatobiliary: Liver parenchyma appears normal. No evidence of cholecystitis. Pancreas: Normal Spleen: Normal Adrenals/Urinary Tract: Adrenal glands are normal. Kidneys are poorly functioning. No evidence of obstruction or mass. Bladder shows wall thickening. Stomach/Bowel: Interval colectomy on the right. No complicating feature seen relative to that. No sign of bowel obstruction. Vascular/Lymphatic: Worsening of the appearance at the distal aorta, aortic bifurcation and left iliac region, with more surrounding extravascular density. Actual low-density material surrounding the enhancing vessels. As previously, this could be related to acute arteritis, possibly infectious, or to a contained perforation with persistent leakage. Reproductive: Normal except for an enlarged prostate. Other: No free fluid or air. Musculoskeletal: Chronic degenerative changes of the spine, with chronic hemangioma within L2 and chronic endplate Schmorl's nodes. IMPRESSION: 1. Interval colectomy on the right. No complicating feature seen relative to that. 2. Worsening of the appearance at the distal aorta, aortic bifurcation and left iliac region. As previously, this could be related to acute arteritis, possibly infectious, or to a contained perforation with persistent leakage. There is low-density  adjacent to the vessel which could represent actual abscess. 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 Infection  is the main concern in this case. Congestive heart failure/fluid overload with bilateral effusions, interstitial edema and dependent atelectasis. Electronically Signed   By: [name] M.D.   On: 07/08/2018 15:52   Dg Chest Portable 1 View  Result Date: 07/08/2018 CLINICAL DATA:  Weakness and dizziness EXAM: PORTABLE CHEST 1 VIEW COMPARISON:  06/19/2018 FINDINGS: Moderate cardiomegaly is unchanged. There is mild interstitial pulmonary edema. No pleural effusion or pneumothorax. IMPRESSION: Moderate cardiomegaly with mild interstitial pulmonary edema. Electronically Signed   By: Ulyses Jarred M.D.   On: 07/08/2018 13:41   Ct Angio Abd/pel W/ And/or W/o  Result Date: 07/10/2018 CLINICAL DATA:  Aortoiliac bifurcation infectious arteritis EXAM: CT ANGIOGRAPHY ABDOMEN AND PELVIS WITH CONTRAST AND WITHOUT CONTRAST TECHNIQUE: Multidetector CT imaging of the abdomen and pelvis was performed using the standard protocol during bolus administration of intravenous contrast. Multiplanar reconstructed images and MIPs were obtained and reviewed to evaluate the vascular anatomy. CONTRAST:  155mL ISOVUE-370 IOPAMIDOL (ISOVUE-370) INJECTION 76% COMPARISON:  04/15/2018, 05/14/2018, 07/08/2018 FINDINGS: VASCULAR Aorta: Atherosclerosis of the abdominal aorta. Negative for significant abdominal aneurysm or dissection. Distal aortic bifurcation surrounding strandy edema/inflammation, unchanged. No thrombus or occlusion. Celiac: Atherosclerotic origin with minor narrowing. No significant stenosis. Celiac remains patent including its branches SMA: Atherosclerotic origin but remains patent including its branches Renals: Minor atherosclerosis without significant stenosis by CTA. Main renal arteries remain patent. No  accessory renal artery. IMA: Remains patent off the distal aorta including its branches Inflow: Left common iliac region perivascular strandy edema/inflammation persists with 2 adjacent areas of similar-sized pseudoaneurysm formation/contained perforations. There is similar perivascular ill-defined low-attenuation fluid measuring 2.5 x 2.0 cm suspicious for perivascular developing fluid collection or abscess in the setting of infectious arteritis. No significant interval change, developing dissection, thrombus, occlusion, or enlarging hematoma. Pelvic iliac vessels are atherosclerotic and tortuous but remain patent. No inflow disease or occlusion. Proximal Outflow: Common, profunda femoral, and superficial femoral arteries demonstrated all remain patent. Veins: No definite associated veno-occlusive process. Review of the MIP images confirms the above findings. NON-VASCULAR Lower chest: Similar dependent small to moderate pleural effusions with compressive bibasilar atelectasis. Small pericardial effusion. Heart remains enlarged. Small hiatal hernia. Hepatobiliary: No focal liver abnormality is seen. No gallstones, gallbladder wall thickening, or biliary dilatation. Pancreas: Unremarkable. No pancreatic ductal dilatation or surrounding inflammatory changes. Spleen: Normal in size without focal abnormality. Adrenals/Urinary Tract: Normal adrenal glands. Renal vascular calcifications noted. Suspect left parapelvic hypodense renal cyst measuring 1.5 cm. No renal obstruction or hydronephrosis. No hydroureter. Bladder is thick-walled under distended. Stomach/Bowel: Negative for bowel obstruction, significant dilatation, ileus, free air. Remote right hemicolectomy. Lymphatic: Small para-aortic lymph nodes, suspect reactive. No bulky adenopathy. Reproductive: Prostate gland is enlarged. Seminal vesicles are symmetric. Other: Intact abdominal wall.  No large volume ascites. Musculoskeletal: Degenerative changes of the spine.  Diffuse body anasarca. IMPRESSION: VASCULAR Similar appearance of the left common iliac vascular abnormality concerning for pseudoaneurysm formation/contained perforations with adjacent perivascular developing abscess measuring up to 2.5 cm. Appearance compatible with infectious arteritis, unchanged when compared to 07/08/2018. However, this process has progressed compared 05/14/2018. NON-VASCULAR Similar pleural effusions and basilar atelectasis. Small pericardial effusion. Small hiatal hernia Remote right hemicolectomy without bowel obstruction or perforation Nonspecific bladder wall thickening Electronically Signed   By: Jerilynn Mages.  Shick M.D.   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 ?Severe aortitis-progressing inspite of recent 8 weeks of IV antibiotics and then PO Augmentin. This is  due to  clostridium septicum bacteremia diagnosed in Sept 2019 due to colon cancer.   C. septicum aortitis has  very poor prognosis and without surgical intervention the mortality is high. As in this patient in spite of 8 weeks of appropriate IV antibiotics there is progression of the disease. He will need surgical intervention-but he is a high risk candidate.plan is for stenting after a few days of IV antibiotic Currently  vanco and zosyn -as Blood culture neg- DC both and start unasyn  ( PCN, unasyn and zosyn should all cover C.septicum) The latter two are broad spectrum and covers  gram neg and other anerobes  Colon Cancer- s/p resection and primary anastomosis  ESRD on dialysis  HTN- management as per primary team   ___________________________________________________

## 2018-07-10 NOTE — Progress Notes (Addendum)
Pharmacy Antibiotic Note  Jonathan Mcgrath is a 77 y.o. male admitted on 07/08/2018 with aortitis.  Pt is on HD at home every MWF.  He had HD session on 12/22 (Sun) due to holiday schedule. Patient does not want surgical intervention. Nephrology and ID are consulted. Pharmacy has been consulted for Zosyn dosing.  Plan: Unable to find out next HD. Spoke with nurse and called dialysis. However, patient is no longer receiving Vancomycin per ID recommendations if MRSA PCR (-). Assuming next HD will be 12/26 due to the holiday.   ID initiated Unasyn 3 g IV q12h. Modified to 3 g IV q24h for renal dysfunction.   Height: 6\' 1"  (185.4 cm) Weight: 196 lb 11.2 oz (89.2 kg) IBW/kg (Calculated) : 79.9  Temp (24hrs), Avg:98 F (36.7 C), Min:97.7 F (36.5 C), Max:98.3 F (36.8 C)  Recent Labs  Lab 07/08/18 1227 07/09/18 0317 07/10/18 0454  WBC 15.4* 15.7*  --   CREATININE 5.41* 6.39* 5.19*    Estimated Creatinine Clearance: 13.5 mL/min (A) (by C-G formula based on SCr of 5.19 mg/dL (H)).    No Known Allergies  Antimicrobials this admission: 12/25 Unasyn >> 12/23 Zosyn >> 12/25 12/23 Vancomycin >> 12/24   Dose adjustments this admission: 12/25 Unasyn 3 g IV q12h. Modified to 3 g IV q24h  Microbiology results: 12/23  BCx: NG x 2 days  12/24 MRSA PCR: (-)  Thank you for allowing pharmacy to be a part of this patient's care.  Paticia Stack, PharmD Pharmacy Resident  07/10/2018 9:36 AM

## 2018-07-10 NOTE — Progress Notes (Signed)
Subjective/Chief Complaint: Doing OK. States he continues to feel better. Denies abdominal pain.   Objective: Vital signs in last 24 hours: Temp:  [97.7 F (36.5 C)-98.3 F (36.8 C)] 97.7 F (36.5 C) (12/25 0739) Pulse Rate:  [63-77] 69 (12/25 0739) Resp:  [11-20] 19 (12/25 0739) BP: (130-165)/(58-71) 137/64 (12/25 0739) SpO2:  [90 %-97 %] 90 % (12/25 0739) Weight:  [89.2 kg] 89.2 kg (12/25 0426) Last BM Date: 07/07/18  Intake/Output from previous day: 12/24 0701 - 12/25 0700 In: 44.1 [IV Piggyback:44.1] Out: 1500  Intake/Output this shift: No intake/output data recorded.  General appearance: alert and no distress Resp: clear to auscultation bilaterally Cardio: regular rate and rhythm GI: soft, non-tender; bowel sounds normal; no masses,  no organomegaly Extremities: extremities normal, atraumatic, no cyanosis or edema  Lab Results:  Recent Labs    07/08/18 1227 07/09/18 0317  WBC 15.4* 15.7*  HGB 8.3* 8.0*  HCT 26.7* 25.2*  PLT 148* 127*   BMET Recent Labs    07/09/18 0317 07/10/18 0454  NA 139 139  K 3.3* 3.7  CL 101 97*  CO2 27 28  GLUCOSE 96 159*  BUN 34* 32*  CREATININE 6.39* 5.19*  CALCIUM 8.4* 8.2*   PT/INR No results for input(s): LABPROT, INR in the last 72 hours. ABG No results for input(s): PHART, HCO3 in the last 72 hours.  Invalid input(s): PCO2, PO2  Studies/Results: Ct Chest W Contrast  Result Date: 07/08/2018 CLINICAL DATA:  Dialysis patient. Weakness and dizziness. Chest and abdominal pain. EXAM: CT CHEST, ABDOMEN, AND PELVIS WITH CONTRAST TECHNIQUE: Multidetector CT imaging of the chest, abdomen and pelvis was performed following the standard protocol during bolus administration of intravenous contrast. CONTRAST:  142mL ISOVUE-300 IOPAMIDOL (ISOVUE-300) INJECTION 61% COMPARISON:  Radiography same day. Chest CT 04/18/2018. abdominal CT 05/14/2018 FINDINGS: CT CHEST FINDINGS Cardiovascular: Cardiomegaly. Small pericardial  effusion. Coronary artery calcification. Aortic valve calcification. Thoracic aortic atherosclerotic calcification. No aneurysm or dissection. Mediastinum/Nodes: No mass or adenopathy. Lungs/Pleura: Bilateral effusions layering dependently with dependent pulmonary atelectasis. Interstitial prominence likely reflecting interstitial edema. No non dependent pathology otherwise. Musculoskeletal: No acute thoracic or rib finding. Ordinary spinal degenerative change. CT ABDOMEN PELVIS FINDINGS Hepatobiliary: Liver parenchyma appears normal. No evidence of cholecystitis. Pancreas: Normal Spleen: Normal Adrenals/Urinary Tract: Adrenal glands are normal. Kidneys are poorly functioning. No evidence of obstruction or mass. Bladder shows wall thickening. Stomach/Bowel: Interval colectomy on the right. No complicating feature seen relative to that. No sign of bowel obstruction. Vascular/Lymphatic: Worsening of the appearance at the distal aorta, aortic bifurcation and left iliac region, with more surrounding extravascular density. Actual low-density material surrounding the enhancing vessels. As previously, this could be related to acute arteritis, possibly infectious, or to a contained perforation with persistent leakage. Reproductive: Normal except for an enlarged prostate. Other: No free fluid or air. Musculoskeletal: Chronic degenerative changes of the spine, with chronic hemangioma within L2 and chronic endplate Schmorl's nodes. IMPRESSION: 1. Interval colectomy on the right. No complicating feature seen relative to that. 2. Worsening of the appearance at the distal aorta, aortic bifurcation and left iliac region. As previously, this could be related to acute arteritis, possibly infectious, or to a contained perforation with persistent leakage. There is low-density adjacent to the vessel which could represent actual abscess. 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 Infection is the main concern in this case. Congestive heart failure/fluid overload with bilateral effusions,  interstitial edema and dependent atelectasis. Electronically Signed   By: [name] M.D.   On: 07/08/2018 15:52   Ct Abdomen Pelvis W Contrast  Result Date: 07/08/2018 CLINICAL DATA:  Dialysis patient. Weakness and dizziness. Chest and abdominal pain. EXAM: CT CHEST, ABDOMEN, AND PELVIS WITH CONTRAST TECHNIQUE: Multidetector CT imaging of the chest, abdomen and pelvis was performed following the standard protocol during bolus administration of intravenous contrast. CONTRAST:  181mL ISOVUE-300 IOPAMIDOL (ISOVUE-300) INJECTION 61% COMPARISON:  Radiography same day. Chest CT 04/18/2018. abdominal CT 05/14/2018 FINDINGS: CT CHEST FINDINGS Cardiovascular: Cardiomegaly. Small pericardial effusion. Coronary artery calcification. Aortic valve calcification. Thoracic aortic atherosclerotic calcification. No aneurysm or dissection. Mediastinum/Nodes: No mass or adenopathy. Lungs/Pleura: Bilateral effusions layering dependently with dependent pulmonary atelectasis. Interstitial prominence likely reflecting interstitial edema. No non dependent pathology otherwise. Musculoskeletal: No acute thoracic or rib finding. Ordinary spinal degenerative change. CT ABDOMEN PELVIS FINDINGS Hepatobiliary: Liver parenchyma appears normal. No evidence of cholecystitis. Pancreas: Normal Spleen: Normal Adrenals/Urinary Tract: Adrenal glands are normal. Kidneys are poorly functioning. No evidence of obstruction or mass. Bladder shows wall thickening. Stomach/Bowel: Interval colectomy on the right. No complicating feature seen relative to that. No sign of bowel obstruction. Vascular/Lymphatic: Worsening of the appearance at the distal aorta, aortic bifurcation and left iliac region, with more surrounding extravascular density. Actual low-density material surrounding the enhancing  vessels. As previously, this could be related to acute arteritis, possibly infectious, or to a contained perforation with persistent leakage. Reproductive: Normal except for an enlarged prostate. Other: No free fluid or air. Musculoskeletal: Chronic degenerative changes of the spine, with chronic hemangioma within L2 and chronic endplate Schmorl's nodes. IMPRESSION: 1. Interval colectomy on the right. No complicating feature seen relative to that. 2. Worsening of the appearance at the distal aorta, aortic bifurcation and left iliac region. As previously, this could be related to acute arteritis, possibly infectious, or to a contained perforation with persistent leakage. There is low-density adjacent to the vessel which could represent actual abscess. 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 Infection is the main concern in this case. Congestive heart failure/fluid overload with bilateral effusions, interstitial edema and dependent atelectasis. Electronically Signed   By: [name] M.D.   On: 07/08/2018 15:52   Dg Chest Portable 1 View  Result Date: 07/08/2018 CLINICAL DATA:  Weakness and dizziness EXAM: PORTABLE CHEST 1 VIEW COMPARISON:  06/19/2018 FINDINGS: Moderate cardiomegaly is unchanged. There is mild interstitial pulmonary edema. No pleural effusion or pneumothorax. IMPRESSION: Moderate cardiomegaly with mild interstitial pulmonary edema. Electronically Signed   By: Ulyses Jarred M.D.   On: 07/08/2018 13:41    Anti-infectives: Anti-infectives (From admission, onward)   Start     Dose/Rate Route Frequency Ordered Stop   07/10/18 1200  vancomycin (VANCOCIN) IVPB 1000 mg/200 mL premix  Status:  Discontinued     1,000 mg 200 mL/hr over 60 Minutes Intravenous Every M-W-F (Hemodialysis) 07/08/18 1941 07/09/18 1242   07/09/18 1241  vancomycin (VANCOCIN) IVPB 1000 mg/200 mL premix     1,000 mg 200  mL/hr over 60 Minutes Intravenous Every Dialysis 07/09/18 1242     07/08/18 1915  piperacillin-tazobactam (ZOSYN) IVPB 3.375 g     3.375 g 12.5 mL/hr over 240 Minutes Intravenous Every 12 hours 07/08/18 1909     07/08/18 1915  vancomycin (VANCOCIN) 2,000 mg in sodium chloride 0.9 % 500 mL IVPB     2,000 mg 250 mL/hr  over 120 Minutes Intravenous  Once 07/08/18 1913 07/08/18 2321      Assessment/Plan: Aortitis  CT reviewed: Slightly improved area of inflammation at Aorta and Left Iliac.  Blood Cultures negative to date.  Continue ABX and Steroids.  Extensive discussion with patient regarding findings, current treatment and potential outcomes.  I explained that open operative intervention would be extensive and likely highly morbid. He stated he did not want extensive surgery with the possibility of prolonged recovery and morbidity.  I discussed the recommendation of endovascular intervention. He is agreeable.  Will discuss timing with Infectious Disease, Dr. Lucky Cowboy and Dr. Delana Meyer.  ..  LOS: 2 days    Jamesetta So A 07/10/2018

## 2018-07-10 NOTE — Progress Notes (Signed)
[facility] NAME: Jonathan Mcgrath    MR#:  [id number]  DATE OF BIRTH:  04/13/1941  SUBJECTIVE:  Doing well this morning.  Reports no pain.  REVIEW OF SYSTEMS:    Review of Systems  Constitutional: Negative for fever, chills weight loss HENT: Negative for ear pain, nosebleeds, congestion, facial swelling, rhinorrhea, neck pain, neck stiffness and ear discharge.   Respiratory: Negative for cough, shortness of breath, wheezing  Cardiovascular: Negative for chest pain, palpitations and leg swelling.  Gastrointestinal: Negative for heartburn, abdominal pain, vomiting, diarrhea or consitpation Genitourinary: Negative for dysuria, urgency, frequency, hematuria Musculoskeletal: Negative for back pain or joint pain Neurological: Negative for dizziness, seizures, syncope, focal weakness,  numbness and headaches.  Hematological: Does not bruise/bleed easily.  Psychiatric/Behavioral: Negative for hallucinations, confusion, dysphoric mood    Tolerating Diet: yes      DRUG ALLERGIES:  No Known Allergies  VITALS:  Blood pressure 137/64, pulse 69, temperature 97.7 F (36.5 C), temperature source Oral, resp. rate 19, height 6\' 1"  (1.854 m), weight 89.2 kg, SpO2 90 %.  PHYSICAL EXAMINATION:  Constitutional: Appears well-developed and well-nourished. No distress. HENT: Normocephalic. Marland Kitchen Oropharynx is clear and moist.  Eyes: Conjunctivae and EOM are normal. PERRLA, no scleral icterus.  Neck: Normal ROM. Neck supple. No JVD. No tracheal deviation. CVS: RRR, S1/S2 +, no murmurs, no gallops, no carotid bruit.  Pulmonary: Effort and breath sounds normal, no stridor, rhonchi, wheezes, rales.  Abdominal: Soft. BS +,  no distension, tenderness, rebound or guarding.  Musculoskeletal: Normal range of motion. No edema and no tenderness.  Neuro: Alert. CN 2-12 grossly intact. No focal deficits. Skin: Skin is warm and dry. No rash  noted. Psychiatric: Normal mood and affect.      LABORATORY PANEL:   CBC Recent Labs  Lab 07/09/18 0317  WBC 15.7*  HGB 8.0*  HCT 25.2*  PLT 127*   ------------------------------------------------------------------------------------------------------------------  Chemistries  Recent Labs  Lab 07/08/18 1227  07/10/18 0454  NA 139   < > 139  K 3.5   < > 3.7  CL 100   < > 97*  CO2 29   < > 28  GLUCOSE 123*   < > 159*  BUN 28*   < > 32*  CREATININE 5.41*   < > 5.19*  CALCIUM 8.3*   < > 8.2*  AST 23  --   --   ALT 19  --   --   ALKPHOS 95  --   --   BILITOT 1.2  --   --    < > = values in this interval not displayed.   ------------------------------------------------------------------------------------------------------------------  Cardiac Enzymes No results for input(s): TROPONINI in the last 168 hours. ------------------------------------------------------------------------------------------------------------------  RADIOLOGY:  Ct Chest W Contrast  Result Date: 07/08/2018 CLINICAL DATA:  Dialysis patient. Weakness and dizziness. Chest and abdominal pain. EXAM: CT CHEST, ABDOMEN, AND PELVIS WITH CONTRAST TECHNIQUE: Multidetector CT imaging of the chest, abdomen and pelvis was performed following the standard protocol during bolus administration of intravenous contrast. CONTRAST:  11mL ISOVUE-300 IOPAMIDOL (ISOVUE-300) INJECTION 61% COMPARISON:  Radiography same day. Chest CT 04/18/2018. abdominal CT 05/14/2018 FINDINGS: CT CHEST FINDINGS Cardiovascular: Cardiomegaly. Small pericardial effusion. Coronary artery calcification. Aortic valve calcification. Thoracic aortic atherosclerotic calcification. No aneurysm or dissection. Mediastinum/Nodes: No mass or adenopathy. Lungs/Pleura: Bilateral effusions layering dependently with dependent pulmonary atelectasis. Interstitial prominence likely reflecting interstitial edema. No non dependent pathology otherwise.  Musculoskeletal: No acute thoracic or  rib finding. Ordinary spinal degenerative change. CT ABDOMEN PELVIS FINDINGS Hepatobiliary: Liver parenchyma appears normal. No evidence of cholecystitis. Pancreas: Normal Spleen: Normal Adrenals/Urinary Tract: Adrenal glands are normal. Kidneys are poorly functioning. No evidence of obstruction or mass. Bladder shows wall thickening. Stomach/Bowel: Interval colectomy on the right. No complicating feature seen relative to that. No sign of bowel obstruction. Vascular/Lymphatic: Worsening of the appearance at the distal aorta, aortic bifurcation and left iliac region, with more surrounding extravascular density. Actual low-density material surrounding the enhancing vessels. As previously, this could be related to acute arteritis, possibly infectious, or to a contained perforation with persistent leakage. Reproductive: Normal except for an enlarged prostate. Other: No free fluid or air. Musculoskeletal: Chronic degenerative changes of the spine, with chronic hemangioma within L2 and chronic endplate Schmorl's nodes. IMPRESSION: 1. Interval colectomy on the right. No complicating feature seen relative to that. 2. Worsening of the appearance at the distal aorta, aortic bifurcation and left iliac region. As previously, this could be related to acute arteritis, possibly infectious, or to a contained perforation with persistent leakage. There is low-density adjacent to the vessel which could represent actual abscess. 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 Infection is the main concern in this case. Congestive heart failure/fluid overload with bilateral effusions, interstitial edema and dependent atelectasis. Electronically Signed   By: [name] M.D.   On: 07/08/2018 15:52   Ct Abdomen Pelvis W Contrast  Result Date: 07/08/2018 CLINICAL DATA:  Dialysis patient. Weakness and  dizziness. Chest and abdominal pain. EXAM: CT CHEST, ABDOMEN, AND PELVIS WITH CONTRAST TECHNIQUE: Multidetector CT imaging of the chest, abdomen and pelvis was performed following the standard protocol during bolus administration of intravenous contrast. CONTRAST:  111mL ISOVUE-300 IOPAMIDOL (ISOVUE-300) INJECTION 61% COMPARISON:  Radiography same day. Chest CT 04/18/2018. abdominal CT 05/14/2018 FINDINGS: CT CHEST FINDINGS Cardiovascular: Cardiomegaly. Small pericardial effusion. Coronary artery calcification. Aortic valve calcification. Thoracic aortic atherosclerotic calcification. No aneurysm or dissection. Mediastinum/Nodes: No mass or adenopathy. Lungs/Pleura: Bilateral effusions layering dependently with dependent pulmonary atelectasis. Interstitial prominence likely reflecting interstitial edema. No non dependent pathology otherwise. Musculoskeletal: No acute thoracic or rib finding. Ordinary spinal degenerative change. CT ABDOMEN PELVIS FINDINGS Hepatobiliary: Liver parenchyma appears normal. No evidence of cholecystitis. Pancreas: Normal Spleen: Normal Adrenals/Urinary Tract: Adrenal glands are normal. Kidneys are poorly functioning. No evidence of obstruction or mass. Bladder shows wall thickening. Stomach/Bowel: Interval colectomy on the right. No complicating feature seen relative to that. No sign of bowel obstruction. Vascular/Lymphatic: Worsening of the appearance at the distal aorta, aortic bifurcation and left iliac region, with more surrounding extravascular density. Actual low-density material surrounding the enhancing vessels. As previously, this could be related to acute arteritis, possibly infectious, or to a contained perforation with persistent leakage. Reproductive: Normal except for an enlarged prostate. Other: No free fluid or air. Musculoskeletal: Chronic degenerative changes of the spine, with chronic hemangioma within L2 and chronic endplate Schmorl's nodes. IMPRESSION: 1. Interval  colectomy on the right. No complicating feature seen relative to that. 2. Worsening of the appearance at the distal aorta, aortic bifurcation and left iliac region. As previously, this could be related to acute arteritis, possibly infectious, or to a contained perforation with persistent leakage. There is low-density adjacent to the vessel which could represent actual abscess. 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 Infection is the  main concern in this case. Congestive heart failure/fluid overload with bilateral effusions, interstitial edema and dependent atelectasis. Electronically Signed   By: [name] M.D.   On: 07/08/2018 15:52   Dg Chest Portable 1 View  Result Date: 07/08/2018 CLINICAL DATA:  Weakness and dizziness EXAM: PORTABLE CHEST 1 VIEW COMPARISON:  06/19/2018 FINDINGS: Moderate cardiomegaly is unchanged. There is mild interstitial pulmonary edema. No pleural effusion or pneumothorax. IMPRESSION: Moderate cardiomegaly with mild interstitial pulmonary edema. Electronically Signed   By: Ulyses Jarred M.D.   On: 07/08/2018 13:41   Ct Angio Abd/pel W/ And/or W/o  Result Date: 07/10/2018 CLINICAL DATA:  Aortoiliac bifurcation infectious arteritis EXAM: CT ANGIOGRAPHY ABDOMEN AND PELVIS WITH CONTRAST AND WITHOUT CONTRAST TECHNIQUE: Multidetector CT imaging of the abdomen and pelvis was performed using the standard protocol during bolus administration of intravenous contrast. Multiplanar reconstructed images and MIPs were obtained and reviewed to evaluate the vascular anatomy. CONTRAST:  139mL ISOVUE-370 IOPAMIDOL (ISOVUE-370) INJECTION 76% COMPARISON:  04/15/2018, 05/14/2018, 07/08/2018 FINDINGS: VASCULAR Aorta: Atherosclerosis of the abdominal aorta. Negative for significant abdominal aneurysm or dissection. Distal aortic bifurcation surrounding strandy edema/inflammation, unchanged. No thrombus  or occlusion. Celiac: Atherosclerotic origin with minor narrowing. No significant stenosis. Celiac remains patent including its branches SMA: Atherosclerotic origin but remains patent including its branches Renals: Minor atherosclerosis without significant stenosis by CTA. Main renal arteries remain patent. No accessory renal artery. IMA: Remains patent off the distal aorta including its branches Inflow: Left common iliac region perivascular strandy edema/inflammation persists with 2 adjacent areas of similar-sized pseudoaneurysm formation/contained perforations. There is similar perivascular ill-defined low-attenuation fluid measuring 2.5 x 2.0 cm suspicious for perivascular developing fluid collection or abscess in the setting of infectious arteritis. No significant interval change, developing dissection, thrombus, occlusion, or enlarging hematoma. Pelvic iliac vessels are atherosclerotic and tortuous but remain patent. No inflow disease or occlusion. Proximal Outflow: Common, profunda femoral, and superficial femoral arteries demonstrated all remain patent. Veins: No definite associated veno-occlusive process. Review of the MIP images confirms the above findings. NON-VASCULAR Lower chest: Similar dependent small to moderate pleural effusions with compressive bibasilar atelectasis. Small pericardial effusion. Heart remains enlarged. Small hiatal hernia. Hepatobiliary: No focal liver abnormality is seen. No gallstones, gallbladder wall thickening, or biliary dilatation. Pancreas: Unremarkable. No pancreatic ductal dilatation or surrounding inflammatory changes. Spleen: Normal in size without focal abnormality. Adrenals/Urinary Tract: Normal adrenal glands. Renal vascular calcifications noted. Suspect left parapelvic hypodense renal cyst measuring 1.5 cm. No renal obstruction or hydronephrosis. No hydroureter. Bladder is thick-walled under distended. Stomach/Bowel: Negative for bowel obstruction, significant  dilatation, ileus, free air. Remote right hemicolectomy. Lymphatic: Small para-aortic lymph nodes, suspect reactive. No bulky adenopathy. Reproductive: Prostate gland is enlarged. Seminal vesicles are symmetric. Other: Intact abdominal wall.  No large volume ascites. Musculoskeletal: Degenerative changes of the spine. Diffuse body anasarca. IMPRESSION: VASCULAR Similar appearance of the left common iliac vascular abnormality concerning for pseudoaneurysm formation/contained perforations with adjacent perivascular developing abscess measuring up to 2.5 cm. Appearance compatible with infectious arteritis, unchanged when compared to 07/08/2018. However, this process has progressed compared 05/14/2018. NON-VASCULAR Similar pleural effusions and basilar atelectasis. Small pericardial effusion. Small hiatal hernia Remote right hemicolectomy without bowel obstruction or perforation Nonspecific bladder wall thickening Electronically Signed   By: Jerilynn Mages.  Shick M.D.   On: 07/10/2018 09:46     ASSESSMENT AND PLAN:   77 year old male with a history of colon cancer status post hemicolectomy October 2019, diabetes and end-stage renal disease on hemodialysis who presented to the emergency  room due to abdominal pain.  1.  Aortitis: This is etiology of patient's abdominal pain.   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 This morning CT shows improved area and reformation the aorta and left iliac. Plan discussed with vascular surgery Continue IV antibiotics Continue IV steroids Patient will need endovascular intervention however at high risk for extensive surgery.   2.  Acute hypoxic respiratory failure due to acute diastolic heart failure and volume overload due to end-stage renal disease Patient has been weaned off of oxygen  3.  End-stage renal disease on hemodialysis: Continue dialysis as per recommendations by nephrology  4.  Essential hypertension: Blood  pressure ideally should be systolic less than 741 per vascular surgery recommendations. Continue Norvasc and Coreg Added hydralazine for better blood pressure control.  5.  Diabetes: Continue sliding scale 6.  Anemia chronic disease with stable hemoglobin Continue iron supplement    Discussed with Dr. Lorenso Courier Management plans discussed with the patient and he is in agreement.  CODE STATUS: Partial/DNI  TOTAL TIME TAKING CARE OF THIS PATIENT: 24 minutes.     POSSIBLE D/C 3 to 5 days, DEPENDING ON CLINICAL CONDITION.   Sakari Alkhatib M.D on 07/10/2018 at 10:28 AM  Between 7am to 6pm - Pager - [phone] After 6pm go to [email or web address] - password [email or web address] [facility]  Office  [phone]  CC: Primary care physician; [employer]  Note: This dictation was prepared with Diplomatic Services operational officer dictation along with smaller Company secretary. Any transcriptional errors that result from this process are unintentional.

## 2018-07-11 LAB — CBC
HCT: 25.7 % — ABNORMAL LOW (ref 39.0–52.0)
Hemoglobin: 8.2 g/dL — ABNORMAL LOW (ref 13.0–17.0)
MCH: 29 pg (ref 26.0–34.0)
MCHC: 31.9 g/dL (ref 30.0–36.0)
MCV: 90.8 fL (ref 80.0–100.0)
Platelets: 178 10*3/uL (ref 150–400)
RBC: 2.83 MIL/uL — ABNORMAL LOW (ref 4.22–5.81)
RDW: 17.9 % — ABNORMAL HIGH (ref 11.5–15.5)
WBC: 19.5 10*3/uL — ABNORMAL HIGH (ref 4.0–10.5)
nRBC: 0 % (ref 0.0–0.2)

## 2018-07-11 LAB — BASIC METABOLIC PANEL
Anion gap: 15 (ref 5–15)
BUN: 56 mg/dL — ABNORMAL HIGH (ref 8–23)
CO2: 24 mmol/L (ref 22–32)
Calcium: 7.9 mg/dL — ABNORMAL LOW (ref 8.9–10.3)
Chloride: 98 mmol/L (ref 98–111)
Creatinine, Ser: 7.34 mg/dL — ABNORMAL HIGH (ref 0.61–1.24)
GFR calc Af Amer: 8 mL/min — ABNORMAL LOW (ref >60)
GFR calc non Af Amer: 6 mL/min — ABNORMAL LOW (ref >60)
Glucose, Bld: 157 mg/dL — ABNORMAL HIGH (ref 70–99)
Potassium: 3.8 mmol/L (ref 3.5–5.1)
Sodium: 137 mmol/L (ref 135–145)

## 2018-07-11 LAB — HEMOGLOBIN A1C
Hgb A1c MFr Bld: 4.3 % — ABNORMAL LOW (ref 4.8–5.6)
Mean Plasma Glucose: 77 mg/dL

## 2018-07-11 LAB — GLUCOSE, CAPILLARY
Glucose-Capillary: 139 mg/dL — ABNORMAL HIGH (ref 70–99)
Glucose-Capillary: 180 mg/dL — ABNORMAL HIGH (ref 70–99)
Glucose-Capillary: 220 mg/dL — ABNORMAL HIGH (ref 70–99)
Glucose-Capillary: 165 mg/dL — ABNORMAL HIGH (ref 70–99)

## 2018-07-11 MED ORDER — METHYLPREDNISOLONE SODIUM SUCC 125 MG IJ SOLR
80.0000 mg | Freq: Every day | INTRAMUSCULAR | Status: DC
  Administered 2018-07-12 – 2018-07-13 (×2): 80 mg via INTRAVENOUS
  Filled 2018-07-11 (×2): qty 2

## 2018-07-11 NOTE — Progress Notes (Signed)
Jonathan Mcgrath NAME: Jonathan Mcgrath    MR#:  [id number]  DATE OF BIRTH:  [birth year]  SUBJECTIVE:  No acute events overnight  REVIEW OF SYSTEMS:    Review of Systems  Constitutional: Negative for fever, chills weight loss HENT: Negative for ear pain, nosebleeds, congestion, facial swelling, rhinorrhea, neck pain, neck stiffness and ear discharge.   Respiratory: Negative for cough, shortness of breath, wheezing  Cardiovascular: Negative for chest pain, palpitations and leg swelling.  Gastrointestinal: Negative for heartburn, abdominal pain, vomiting, diarrhea or consitpation Genitourinary: Negative for dysuria, urgency, frequency, hematuria Musculoskeletal: Negative for back pain or joint pain Neurological: Negative for dizziness, seizures, syncope, focal weakness,  numbness and headaches.  Hematological: Does not bruise/bleed easily.  Psychiatric/Behavioral: Negative for hallucinations, confusion, dysphoric mood    Tolerating Diet: yes      DRUG ALLERGIES:  No Known Allergies  VITALS:  Blood pressure (!) 151/65, pulse 69, temperature 98.2 F (36.8 C), temperature source Oral, resp. rate 20, height 6\' 1"  (1.854 m), weight 90.7 kg, SpO2 94 %.  PHYSICAL EXAMINATION:  Constitutional: Appears well-developed and well-nourished. No distress. HENT: Normocephalic. Marland Kitchen Oropharynx is clear and moist.  Eyes: Conjunctivae and EOM are normal. PERRLA, no scleral icterus.  Neck: Normal ROM. Neck supple. No JVD. No tracheal deviation. CVS: RRR, S1/S2 +, no murmurs, no gallops, no carotid bruit.  Pulmonary: Effort and breath sounds normal, no stridor, rhonchi, wheezes, rales.  Abdominal: Soft. BS +,  no distension, tenderness, rebound or guarding.  Musculoskeletal: Normal range of motion. No edema and no tenderness.  Neuro: Alert. CN 2-12 grossly intact. No focal deficits. Skin: Skin is warm and dry. No rash noted. Psychiatric: Normal mood  and affect.      LABORATORY PANEL:   CBC Recent Labs  Lab 07/11/18 0633  WBC 19.5*  HGB 8.2*  HCT 25.7*  PLT 178   ------------------------------------------------------------------------------------------------------------------  Chemistries  Recent Labs  Lab 07/08/18 1227  07/11/18 0633  NA 139   < > 137  K 3.5   < > 3.8  CL 100   < > 98  CO2 29   < > 24  GLUCOSE 123*   < > 157*  BUN 28*   < > 56*  CREATININE 5.41*   < > 7.34*  CALCIUM 8.3*   < > 7.9*  AST 23  --   --   ALT 19  --   --   ALKPHOS 95  --   --   BILITOT 1.2  --   --    < > = values in this interval not displayed.   ------------------------------------------------------------------------------------------------------------------  Cardiac Enzymes No results for input(s): TROPONINI in the last 168 hours. ------------------------------------------------------------------------------------------------------------------  RADIOLOGY:  Ct Angio Abd/pel W/ And/or W/o  Result Date: 07/10/2018 CLINICAL DATA:  Aortoiliac bifurcation infectious arteritis EXAM: CT ANGIOGRAPHY ABDOMEN AND PELVIS WITH CONTRAST AND WITHOUT CONTRAST TECHNIQUE: Multidetector CT imaging of the abdomen and pelvis was performed using the standard protocol during bolus administration of intravenous contrast. Multiplanar reconstructed images and MIPs were obtained and reviewed to evaluate the vascular anatomy. CONTRAST:  174mL ISOVUE-370 IOPAMIDOL (ISOVUE-370) INJECTION 76% COMPARISON:  04/15/2018, 05/14/2018, 07/08/2018 FINDINGS: VASCULAR Aorta: Atherosclerosis of the abdominal aorta. Negative for significant abdominal aneurysm or dissection. Distal aortic bifurcation surrounding strandy edema/inflammation, unchanged. No thrombus or occlusion. Celiac: Atherosclerotic origin with minor narrowing. No significant stenosis. Celiac remains patent including its branches SMA: Atherosclerotic origin but remains patent including its branches  Renals:  Minor atherosclerosis without significant stenosis by CTA. Main renal arteries remain patent. No accessory renal artery. IMA: Remains patent off the distal aorta including its branches Inflow: Left common iliac region perivascular strandy edema/inflammation persists with 2 adjacent areas of similar-sized pseudoaneurysm formation/contained perforations. There is similar perivascular ill-defined low-attenuation fluid measuring 2.5 x 2.0 cm suspicious for perivascular developing fluid collection or abscess in the setting of infectious arteritis. No significant interval change, developing dissection, thrombus, occlusion, or enlarging hematoma. Pelvic iliac vessels are atherosclerotic and tortuous but remain patent. No inflow disease or occlusion. Proximal Outflow: Common, profunda femoral, and superficial femoral arteries demonstrated all remain patent. Veins: No definite associated veno-occlusive process. Review of the MIP images confirms the above findings. NON-VASCULAR Lower chest: Similar dependent small to moderate pleural effusions with compressive bibasilar atelectasis. Small pericardial effusion. Heart remains enlarged. Small hiatal hernia. Hepatobiliary: No focal liver abnormality is seen. No gallstones, gallbladder wall thickening, or biliary dilatation. Pancreas: Unremarkable. No pancreatic ductal dilatation or surrounding inflammatory changes. Spleen: Normal in size without focal abnormality. Adrenals/Urinary Tract: Normal adrenal glands. Renal vascular calcifications noted. Suspect left parapelvic hypodense renal cyst measuring 1.5 cm. No renal obstruction or hydronephrosis. No hydroureter. Bladder is thick-walled under distended. Stomach/Bowel: Negative for bowel obstruction, significant dilatation, ileus, free air. Remote right hemicolectomy. Lymphatic: Small para-aortic lymph nodes, suspect reactive. No bulky adenopathy. Reproductive: Prostate gland is enlarged. Seminal vesicles are symmetric. Other:  Intact abdominal wall.  No large volume ascites. Musculoskeletal: Degenerative changes of the spine. Diffuse body anasarca. IMPRESSION: VASCULAR Similar appearance of the left common iliac vascular abnormality concerning for pseudoaneurysm formation/contained perforations with adjacent perivascular developing abscess measuring up to 2.5 cm. Appearance compatible with infectious arteritis, unchanged when compared to 07/08/2018. However, this process has progressed compared 05/14/2018. NON-VASCULAR Similar pleural effusions and basilar atelectasis. Small pericardial effusion. Small hiatal hernia Remote right hemicolectomy without bowel obstruction or perforation Nonspecific bladder wall thickening Electronically Signed   By: Jerilynn Mages.  Shick M.D.   On: 07/10/2018 09:46     ASSESSMENT AND PLAN:   77 year old male with a history of colon cancer status post hemicolectomy October 2019, diabetes and end-stage renal disease on hemodialysis who presented to the emergency room due to abdominal pain.  1. 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 Patient has overall poor prognosis Best option is lifelong antibiotics and endovascular aortic graft to prevent rupture Plan for intervention next week with vascular surgery Continue Unasyn Continue IV steroids as per vascular surgery   2.  Acute hypoxic respiratory failure due to acute diastolic heart failure and volume overload due to end-stage renal disease Patient has been weaned off of oxygen  3.  End-stage renal disease on hemodialysis: Continue dialysis as per recommendations by nephrology  4.  Essential hypertension: Blood pressure ideally should be systolic less than 161 per vascular surgery recommendations. Continue Norvasc and Coreg Added hydralazine for better blood pressure  control.  5.  Diabetes: Continue sliding scale 6.  Anemia chronic disease with stable hemoglobin Continue iron supplement  Palliative care consultation placed  Discussed with Dr. Lorenso Courier Management plans discussed with the patient and he is in agreement.  CODE STATUS: Partial/DNI  TOTAL TIME TAKING CARE OF THIS PATIENT: 24 minutes.     POSSIBLE D/C next week DEPENDING ON CLINICAL CONDITION.   Jonathan Mcgrath M.D on  07/11/2018 at 11:26 AM  Between 7am to 6pm - Pager - [phone] After 6pm go to [email or web address] - password [email or web address] [facility]  Office  [phone]  CC: Primary care physician; [employer]  Note: This dictation was prepared with Diplomatic Services operational officer dictation along with smaller Company secretary. Any transcriptional errors that result from this process are unintentional.

## 2018-07-11 NOTE — Progress Notes (Signed)
Date of Admission:  07/08/2018      ID: Jonathan Mcgrath is a 77 y.o. 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 amLODipine  10 mg Oral Daily  . aspirin EC  81 mg Oral Daily  . carvedilol  12.5 mg Oral BID WC  . Chlorhexidine Gluconate Cloth  6 each Topical Q0600  . cinacalcet  30 mg Oral BH-q7a  . epoetin (EPOGEN/PROCRIT) injection  10,000 Units Intravenous Q M,W,F-HD  . ferric citrate  210 mg Oral TID WC  . heparin  5,000 Units Subcutaneous Q8H  . hydrALAZINE  25 mg Oral Q8H  . insulin aspart  0-5 Units Subcutaneous QHS  . insulin aspart  0-9 Units Subcutaneous TID WC  . methylPREDNISolone (SOLU-MEDROL) injection  125 mg Intravenous Daily  . multivitamin  1 tablet Oral Daily    Objective: Vital signs in last 24 hours: Temp:  [98.1 F (36.7 C)-98.3 F (36.8 C)] 98.2 F (36.8 C) (12/26 0733) Pulse Rate:  [67-70] 69 (12/26 0733) Resp:  [19-20] 20 (12/26 0733) BP: (124-151)/(56-68) 151/65 (12/26 0733) SpO2:  [93 %-100 %] 94 % (12/26 0733) Weight:  [90.7 kg] 90.7 kg (12/26 0411)  PHYSICAL EXAM:  General: Alert, cooperative, no distress, appears stated age.  Head: Normocephalic, without obvious abnormality, atraumatic. Eyes: Conjunctivae clear, anicteric sclerae. Pupils are equal ENT Nares normal. No drainage or sinus tenderness. Lips, mucosa, and tongue normal. No Thrush Neck: Supple, symmetrical, no adenopathy, thyroid: non tender no carotid bruit and no JVD. Back: No CVA tenderness. Lungs: Clear to auscultation bilaterally. No Wheezing or Rhonchi. No rales. Heart: Regular rate and rhythm, no murmur, rub or gallop. Abdomen: Soft, non-tender,not distended. Bowel sounds normal. No masses Extremities: atraumatic, no cyanosis. No edema. No clubbing Skin: No rashes or lesions. Or  bruising Lymph: Cervical, supraclavicular normal. Neurologic: Grossly non-focal  Lab Results Recent Labs    07/09/18 0317 07/10/18 0454 07/11/18 0633  WBC 15.7*  --  19.5*  HGB 8.0*  --  8.2*  HCT 25.2*  --  25.7*  NA 139 139 137  K 3.3* 3.7 3.8  CL 101 97* 98  CO2 27 28 24   BUN 34* 32* 56*  CREATININE 6.39* 5.19* 7.34*   Liver Panel Recent Labs    07/08/18 1227  PROT 6.2*  ALBUMIN 3.4*  AST 23  ALT 19  ALKPHOS 95  BILITOT 1.2   Sedimentation Rate No results for input(s): ESRSEDRATE in the last 72 hours. C-Reactive Protein No results for input(s): CRP in the last 72 hours.  Microbiology:  Studies/Results: Ct Angio Abd/pel W/ And/or W/o  Result Date: 07/10/2018 CLINICAL DATA:  Aortoiliac bifurcation infectious arteritis EXAM: CT ANGIOGRAPHY ABDOMEN AND PELVIS WITH CONTRAST AND WITHOUT CONTRAST TECHNIQUE: Multidetector CT imaging of the abdomen and pelvis was performed using the standard protocol during bolus administration of intravenous contrast. Multiplanar reconstructed images and MIPs were obtained and reviewed to evaluate the vascular anatomy. CONTRAST:  175mL ISOVUE-370 IOPAMIDOL (ISOVUE-370) INJECTION 76% COMPARISON:  04/15/2018, 05/14/2018, 07/08/2018 FINDINGS: VASCULAR Aorta: Atherosclerosis of the abdominal aorta. Negative for significant abdominal aneurysm or dissection. Distal aortic bifurcation surrounding strandy edema/inflammation, unchanged. No thrombus or occlusion. Celiac: Atherosclerotic origin with minor narrowing. No significant stenosis. Celiac remains patent including its branches SMA: Atherosclerotic origin but remains patent including its branches Renals: Minor atherosclerosis without  significant stenosis by CTA. Main renal arteries remain patent. No accessory renal artery. IMA: Remains patent off the distal aorta including its branches Inflow: Left common iliac region perivascular strandy edema/inflammation persists with 2 adjacent areas of  similar-sized pseudoaneurysm formation/contained perforations. There is similar perivascular ill-defined low-attenuation fluid measuring 2.5 x 2.0 cm suspicious for perivascular developing fluid collection or abscess in the setting of infectious arteritis. No significant interval change, developing dissection, thrombus, occlusion, or enlarging hematoma. Pelvic iliac vessels are atherosclerotic and tortuous but remain patent. No inflow disease or occlusion. Proximal Outflow: Common, profunda femoral, and superficial femoral arteries demonstrated all remain patent. Veins: No definite associated veno-occlusive process. Review of the MIP images confirms the above findings. NON-VASCULAR Lower chest: Similar dependent small to moderate pleural effusions with compressive bibasilar atelectasis. Small pericardial effusion. Heart remains enlarged. Small hiatal hernia. Hepatobiliary: No focal liver abnormality is seen. No gallstones, gallbladder wall thickening, or biliary dilatation. Pancreas: Unremarkable. No pancreatic ductal dilatation or surrounding inflammatory changes. Spleen: Normal in size without focal abnormality. Adrenals/Urinary Tract: Normal adrenal glands. Renal vascular calcifications noted. Suspect left parapelvic hypodense renal cyst measuring 1.5 cm. No renal obstruction or hydronephrosis. No hydroureter. Bladder is thick-walled under distended. Stomach/Bowel: Negative for bowel obstruction, significant dilatation, ileus, free air. Remote right hemicolectomy. Lymphatic: Small para-aortic lymph nodes, suspect reactive. No bulky adenopathy. Reproductive: Prostate gland is enlarged. Seminal vesicles are symmetric. Other: Intact abdominal wall.  No large volume ascites. Musculoskeletal: Degenerative changes of the spine. Diffuse body anasarca. IMPRESSION: VASCULAR Similar appearance of the left common iliac vascular abnormality concerning for pseudoaneurysm formation/contained perforations with adjacent  perivascular developing abscess measuring up to 2.5 cm. Appearance compatible with infectious arteritis, unchanged when compared to 07/08/2018. However, this process has progressed compared 05/14/2018. NON-VASCULAR Similar pleural effusions and basilar atelectasis. Small pericardial effusion. Small hiatal hernia Remote right hemicolectomy without bowel obstruction or perforation Nonspecific bladder wall thickening Electronically Signed   By: Jerilynn Mages.  Shick M.D.   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 ?Severe aortitis-progressing inspite of recent 8 weeks of IV antibiotics and then PO Augmentin. This is  due to clostridium septicum bacteremia diagnosed in Sept 2019 due to colon cancer.  C. septicum aortitis hasvery poor prognosis and without surgical intervention the mortality is high. As in this patient in spite of 8 weeks of appropriate IV antibiotics there is progression of the disease. He will need surgical intervention-but he is a high risk candidate.plan is for stenting after a few days of IV antibiotic Started unasyn 12/25 as blood culture negative and VAnco and zosyn were stopped  ( PCN, unasyn and zosyn should all cover C.septicum) The latter two are broad spectrum and covers gram neg and other anerobes  Colon Cancer- s/p resection and primary anastomosis  ESRD on dialysis  HTN- management as per primary team  Discussed with Vascular surgeon- plan is for endograft next week They will get a Hohn catheter at the same time  Discussed with patient and his son

## 2018-07-11 NOTE — Plan of Care (Signed)
  Problem: Health Behavior/Discharge Planning: Goal: Ability to manage health-related needs will improve Outcome: Not Progressing Note:  Needs a complex procedure performed next week as per the physician's progress notes. Will remain here until then per notes. Will continue to monitor overall progress for the remainder of the shift. Jonathan Mcgrath [facility]

## 2018-07-11 NOTE — Progress Notes (Signed)
[facility]  ROUNDING NOTE   Subjective:  Patient due for dialysis again tomorrow. Resting comfortably in bed. Daughter at bedside.  Objective:  Vital signs in last 24 hours:  Temp:  [98.1 F (36.7 C)-98.3 F (36.8 C)] 98.2 F (36.8 C) (12/26 0733) Pulse Rate:  [67-70] 69 (12/26 0733) Resp:  [19-20] 20 (12/26 0733) BP: (124-151)/(56-68) 151/65 (12/26 0733) SpO2:  [93 %-100 %] 94 % (12/26 0733) Weight:  [90.7 kg] 90.7 kg (12/26 0411)  Weight change: 1.497 kg Filed Weights   07/10/18 0426 07/11/18 0000 07/11/18 0411  Weight: 89.2 kg 90.7 kg 90.7 kg    Intake/Output: I/O last 3 completed shifts: In: 600 [P.O.:600] Out: 0    Intake/Output this shift:  Total I/O In: 240 [P.O.:240] Out: -   Physical Exam: General: No acute distress  Head: Normocephalic, atraumatic. Moist oral mucosal membranes  Eyes: Anicteric  Neck: Supple, trachea midline  Lungs:  Basilar rales, normal effort  Heart: S1S2 no rubs  Abdomen:  Soft, nontender, bowel sounds present  Extremities: trace peripheral edema.  Neurologic: Awake, alert, following commands  Skin: No lesions  Access: LUE AV access    Basic Metabolic Panel: Recent Labs  Lab 07/08/18 1227 07/09/18 0317 07/10/18 0454 07/11/18 0633  NA 139 139 139 137  K 3.5 3.3* 3.7 3.8  CL 100 101 97* 98  CO2 29 27 28 24   GLUCOSE 123* 96 159* 157*  BUN 28* 34* 32* 56*  CREATININE 5.41* 6.39* 5.19* 7.34*  CALCIUM 8.3* 8.4* 8.2* 7.9*    Liver Function Tests: Recent Labs  Lab 07/08/18 1227  AST 23  ALT 19  ALKPHOS 95  BILITOT 1.2  PROT 6.2*  ALBUMIN 3.4*   Recent Labs  Lab 07/08/18 1227  LIPASE 29   No results for input(s): AMMONIA in the last 168 hours.  CBC: Recent Labs  Lab 07/08/18 1227 07/09/18 0317 07/11/18 0633  WBC 15.4* 15.7* 19.5*  NEUTROABS 8.7*  --   --   HGB 8.3* 8.0* 8.2*  HCT 26.7* 25.2* 25.7*  MCV 95.4 92.3 90.8  PLT 148* 127* 178    Cardiac Enzymes: No results for input(s):  CKTOTAL, CKMB, CKMBINDEX, TROPONINI in the last 168 hours.  BNP: Invalid input(s): POCBNP  CBG: Recent Labs  Lab 07/10/18 1147 07/10/18 1716 07/10/18 2103 07/11/18 0735 07/11/18 1145  GLUCAP 181* 195* 193* 139* 180*    Microbiology: Results for orders placed or performed during the hospital encounter of 07/08/18  CULTURE, BLOOD (ROUTINE X 2) w Reflex to ID Panel     Status: None (Preliminary result)   Collection Time: 07/08/18  8:49 PM  Result Value Ref Range Status   Specimen Description BLOOD BLOOD RIGHT HAND  Final   Special Requests   Final    BOTTLES DRAWN AEROBIC AND ANAEROBIC Blood Culture results may not be optimal due to an excessive volume of blood received in culture bottles   Culture   Final    NO GROWTH 3 DAYS Performed at [facility], [address] [place], [place] [address]    Report Status PENDING  Incomplete  CULTURE, BLOOD (ROUTINE X 2) w Reflex to ID Panel     Status: None (Preliminary result)   Collection Time: 07/08/18  8:49 PM  Result Value Ref Range Status   Specimen Description BLOOD FINGER  Final   Special Requests   Final    BOTTLES DRAWN AEROBIC AND ANAEROBIC Blood Culture results may not be optimal due to  an excessive volume of blood received in culture bottles   Culture   Final    NO GROWTH 3 DAYS Performed at [facility], [address] [place], [place] [address]    Report Status PENDING  Incomplete  MRSA PCR Screening     Status: None   Collection Time: 07/09/18 12:33 AM  Result Value Ref Range Status   MRSA by PCR NEGATIVE NEGATIVE Final    Comment:        The GeneXpert MRSA Assay (FDA approved for NASAL specimens only), is one component of a comprehensive MRSA colonization surveillance program. It is not intended to diagnose MRSA infection nor to guide or monitor treatment for MRSA infections. Performed at [facility], [address] [place], [place] [address]     Coagulation Studies: No  results for input(s): LABPROT, INR in the last 72 hours.  Urinalysis: No results for input(s): COLORURINE, LABSPEC, PHURINE, GLUCOSEU, HGBUR, BILIRUBINUR, KETONESUR, PROTEINUR, UROBILINOGEN, NITRITE, LEUKOCYTESUR in the last 72 hours.  Invalid input(s): APPERANCEUR    Imaging: Ct Angio Abd/pel W/ And/or W/o  Result Date: 07/10/2018 CLINICAL DATA:  Aortoiliac bifurcation infectious arteritis EXAM: CT ANGIOGRAPHY ABDOMEN AND PELVIS WITH CONTRAST AND WITHOUT CONTRAST TECHNIQUE: Multidetector CT imaging of the abdomen and pelvis was performed using the standard protocol during bolus administration of intravenous contrast. Multiplanar reconstructed images and MIPs were obtained and reviewed to evaluate the vascular anatomy. CONTRAST:  174mL ISOVUE-370 IOPAMIDOL (ISOVUE-370) INJECTION 76% COMPARISON:  04/15/2018, 05/14/2018, 07/08/2018 FINDINGS: VASCULAR Aorta: Atherosclerosis of the abdominal aorta. Negative for significant abdominal aneurysm or dissection. Distal aortic bifurcation surrounding strandy edema/inflammation, unchanged. No thrombus or occlusion. Celiac: Atherosclerotic origin with minor narrowing. No significant stenosis. Celiac remains patent including its branches SMA: Atherosclerotic origin but remains patent including its branches Renals: Minor atherosclerosis without significant stenosis by CTA. Main renal arteries remain patent. No accessory renal artery. IMA: Remains patent off the distal aorta including its branches Inflow: Left common iliac region perivascular strandy edema/inflammation persists with 2 adjacent areas of similar-sized pseudoaneurysm formation/contained perforations. There is similar perivascular ill-defined low-attenuation fluid measuring 2.5 x 2.0 cm suspicious for perivascular developing fluid collection or abscess in the setting of infectious arteritis. No significant interval change, developing dissection, thrombus, occlusion, or enlarging hematoma. Pelvic iliac  vessels are atherosclerotic and tortuous but remain patent. No inflow disease or occlusion. Proximal Outflow: Common, profunda femoral, and superficial femoral arteries demonstrated all remain patent. Veins: No definite associated veno-occlusive process. Review of the MIP images confirms the above findings. NON-VASCULAR Lower chest: Similar dependent small to moderate pleural effusions with compressive bibasilar atelectasis. Small pericardial effusion. Heart remains enlarged. Small hiatal hernia. Hepatobiliary: No focal liver abnormality is seen. No gallstones, gallbladder wall thickening, or biliary dilatation. Pancreas: Unremarkable. No pancreatic ductal dilatation or surrounding inflammatory changes. Spleen: Normal in size without focal abnormality. Adrenals/Urinary Tract: Normal adrenal glands. Renal vascular calcifications noted. Suspect left parapelvic hypodense renal cyst measuring 1.5 cm. No renal obstruction or hydronephrosis. No hydroureter. Bladder is thick-walled under distended. Stomach/Bowel: Negative for bowel obstruction, significant dilatation, ileus, free air. Remote right hemicolectomy. Lymphatic: Small para-aortic lymph nodes, suspect reactive. No bulky adenopathy. Reproductive: Prostate gland is enlarged. Seminal vesicles are symmetric. Other: Intact abdominal wall.  No large volume ascites. Musculoskeletal: Degenerative changes of the spine. Diffuse body anasarca. IMPRESSION: VASCULAR Similar appearance of the left common iliac vascular abnormality concerning for pseudoaneurysm formation/contained perforations with adjacent perivascular developing abscess measuring up to 2.5 cm. Appearance compatible with infectious arteritis, unchanged  when compared to 07/08/2018. However, this process has progressed compared 05/14/2018. NON-VASCULAR Similar pleural effusions and basilar atelectasis. Small pericardial effusion. Small hiatal hernia Remote right hemicolectomy without bowel obstruction or  perforation Nonspecific bladder wall thickening Electronically Signed   By: Jerilynn Mages.  Shick M.D.   On: 07/10/2018 09:46     Medications:   . ampicillin-sulbactam (UNASYN) IV Stopped (07/10/18 1900)   . amLODipine  10 mg Oral Daily  . aspirin EC  81 mg Oral Daily  . carvedilol  12.5 mg Oral BID WC  . Chlorhexidine Gluconate Cloth  6 each Topical Q0600  . cinacalcet  30 mg Oral BH-q7a  . epoetin (EPOGEN/PROCRIT) injection  10,000 Units Intravenous Q M,W,F-HD  . ferric citrate  210 mg Oral TID WC  . heparin  5,000 Units Subcutaneous Q8H  . hydrALAZINE  25 mg Oral Q8H  . insulin aspart  0-5 Units Subcutaneous QHS  . insulin aspart  0-9 Units Subcutaneous TID WC  . [START ON 07/12/2018] methylPREDNISolone (SOLU-MEDROL) injection  80 mg Intravenous Daily  . multivitamin  1 tablet Oral Daily   acetaminophen **OR** acetaminophen, hydrALAZINE, labetalol, ondansetron **OR** ondansetron (ZOFRAN) IV, polyethylene glycol  Assessment/ Plan:  77 y.o. male with end stage renal disease on hemodialysis,  hypertension, diabetes mellitus, GERD, anemia of CKD, SHPTH, colon cancer status post resection on 10/31 by Dr. [name], CLL followed by Dr. Janese Banks, aortitis with endoleak  [facility] MWF L AVF 92kg  1.  ESRD on HD on MWF.  Patient due for dialysis again tomorrow.  Orders to be prepared.  2.  Anemia chronic kidney disease. Hemoglobin 8.2 at last check.  Maintain the patient on Epogen 10,000 units IV with dialysis.  3.  Secondary hyperparathyroidism.  Recheck serum phosphorus tomorrow.  4.  Hypertension.  Maintain the patient on amlodipine, carvedilol, hydralazine.    5.  Aortitis:  Management as per hospitalist and ID.  Patient will need an additional course of prolonged antibiotics with IV Unasyn.   LOS: 3 [place] 12/26/20191:49 PM

## 2018-07-11 NOTE — Plan of Care (Signed)
Palliative:   Mr. Alcorta is resting quietly in bed.  He greets me making and keeping eye contact.  He is calm and cooperative.  There is no family at bedside at this time. I introduced myself and palliative medicine.  We plan for a meeting tomorrow 12/27, unsure of time as he is scheduled for HD.  No questions at this time. No charge Quinn Axe, NP Palliative medicine team

## 2018-07-11 NOTE — Progress Notes (Signed)
Vascular Surgery  Imaging and cultures reviewed. Discussion with patient. 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  Overall poor prognosis. However, the best option is lifelong ABX and endovascular aortic graft to prevent rupture. With the implicit understanding that the graft will likely become infected at some point and removal will not be an option. Again open aortic resection with subsequent extra anatomic bypass would be highly morbid in this patient.  Will plan for intervention next week per Dr. Lucky Cowboy.

## 2018-07-11 NOTE — Care Management Important Message (Signed)
Important Message  Patient Details  Name: Jonathan Mcgrath MRN: [id number] Date of Birth: Sep 13, 1940   Medicare Important Message Given:  Yes    [name] 07/11/2018, 1:53 PM

## 2018-07-12 LAB — GLUCOSE, CAPILLARY
Glucose-Capillary: 136 mg/dL — ABNORMAL HIGH (ref 70–99)
Glucose-Capillary: 173 mg/dL — ABNORMAL HIGH (ref 70–99)
Glucose-Capillary: 170 mg/dL — ABNORMAL HIGH (ref 70–99)
Glucose-Capillary: 216 mg/dL — ABNORMAL HIGH (ref 70–99)

## 2018-07-12 LAB — BASIC METABOLIC PANEL
Anion gap: 15 (ref 5–15)
BUN: 80 mg/dL — ABNORMAL HIGH (ref 8–23)
CO2: 24 mmol/L (ref 22–32)
Calcium: 7.4 mg/dL — ABNORMAL LOW (ref 8.9–10.3)
Chloride: 96 mmol/L — ABNORMAL LOW (ref 98–111)
Creatinine, Ser: 9.37 mg/dL — ABNORMAL HIGH (ref 0.61–1.24)
GFR calc Af Amer: 6 mL/min — ABNORMAL LOW (ref >60)
GFR calc non Af Amer: 5 mL/min — ABNORMAL LOW (ref >60)
Glucose, Bld: 211 mg/dL — ABNORMAL HIGH (ref 70–99)
Potassium: 4 mmol/L (ref 3.5–5.1)
Sodium: 135 mmol/L (ref 135–145)

## 2018-07-12 LAB — C-REACTIVE PROTEIN: CRP: 3.1 mg/dL — ABNORMAL HIGH (ref <1.0)

## 2018-07-12 MED ORDER — HYDRALAZINE HCL 50 MG PO TABS
50.0000 mg | ORAL_TABLET | Freq: Three times a day (TID) | ORAL | Status: DC
  Administered 2018-07-12 – 2018-07-18 (×18): 50 mg via ORAL
  Filled 2018-07-12 (×18): qty 1

## 2018-07-12 NOTE — Progress Notes (Signed)
[facility]  ROUNDING NOTE   Subjective:  Interventional procedure being planned for next week. Patient due for hemodialysis today.  Objective:  Vital signs in last 24 hours:  Temp:  [97.5 F (36.4 C)-98.3 F (36.8 C)] 97.6 F (36.4 C) (12/27 1435) Pulse Rate:  [62-68] 62 (12/27 1530) Resp:  [13-19] 13 (12/27 1530) BP: (121-162)/(52-108) 128/66 (12/27 1530) SpO2:  [91 %-100 %] 94 % (12/27 1530) Weight:  [93.7 kg] 93.7 kg (12/27 1435)  Weight change:  Filed Weights   07/11/18 0000 07/11/18 0411 07/12/18 1435  Weight: 90.7 kg 90.7 kg 93.7 kg    Intake/Output: I/O last 3 completed shifts: In: 480 [P.O.:480] Out: 0    Intake/Output this shift:  No intake/output data recorded.  Physical Exam: General: No acute distress  Head: Normocephalic, atraumatic. Moist oral mucosal membranes  Eyes: Anicteric  Neck: Supple, trachea midline  Lungs:  Basilar rales, normal effort  Heart: S1S2 no rubs  Abdomen:  Soft, nontender, bowel sounds present  Extremities: trace peripheral edema.  Neurologic: Awake, alert, following commands  Skin: No lesions  Access: LUE AV access    Basic Metabolic Panel: Recent Labs  Lab 07/08/18 1227 07/09/18 0317 07/10/18 0454 07/11/18 0633  NA 139 139 139 137  K 3.5 3.3* 3.7 3.8  CL 100 101 97* 98  CO2 29 27 28 24   GLUCOSE 123* 96 159* 157*  BUN 28* 34* 32* 56*  CREATININE 5.41* 6.39* 5.19* 7.34*  CALCIUM 8.3* 8.4* 8.2* 7.9*    Liver Function Tests: Recent Labs  Lab 07/08/18 1227  AST 23  ALT 19  ALKPHOS 95  BILITOT 1.2  PROT 6.2*  ALBUMIN 3.4*   Recent Labs  Lab 07/08/18 1227  LIPASE 29   No results for input(s): AMMONIA in the last 168 hours.  CBC: Recent Labs  Lab 07/08/18 1227 07/09/18 0317 07/11/18 0633  WBC 15.4* 15.7* 19.5*  NEUTROABS 8.7*  --   --   HGB 8.3* 8.0* 8.2*  HCT 26.7* 25.2* 25.7*  MCV 95.4 92.3 90.8  PLT 148* 127* 178    Cardiac Enzymes: No results for input(s): CKTOTAL, CKMB,  CKMBINDEX, TROPONINI in the last 168 hours.  BNP: Invalid input(s): POCBNP  CBG: Recent Labs  Lab 07/11/18 1145 07/11/18 1626 07/11/18 2112 07/12/18 0754 07/12/18 1142  GLUCAP 180* 220* 165* 136* 173*    Microbiology: Results for orders placed or performed during the hospital encounter of 07/08/18  CULTURE, BLOOD (ROUTINE X 2) w Reflex to ID Panel     Status: None (Preliminary result)   Collection Time: 07/08/18  8:49 PM  Result Value Ref Range Status   Specimen Description BLOOD BLOOD RIGHT HAND  Final   Special Requests   Final    BOTTLES DRAWN AEROBIC AND ANAEROBIC Blood Culture results may not be optimal due to an excessive volume of blood received in culture bottles   Culture   Final    NO GROWTH 4 DAYS Performed at [facility], [address], [place], [place] [address]    Report Status PENDING  Incomplete  CULTURE, BLOOD (ROUTINE X 2) w Reflex to ID Panel     Status: None (Preliminary result)   Collection Time: 07/08/18  8:49 PM  Result Value Ref Range Status   Specimen Description BLOOD FINGER  Final   Special Requests   Final    BOTTLES DRAWN AEROBIC AND ANAEROBIC Blood Culture results may not be optimal due to an excessive volume of blood received  in culture bottles   Culture   Final    NO GROWTH 4 DAYS Performed at [facility], [address] [place], [place] [address]    Report Status PENDING  Incomplete  MRSA PCR Screening     Status: None   Collection Time: 07/09/18 12:33 AM  Result Value Ref Range Status   MRSA by PCR NEGATIVE NEGATIVE Final    Comment:        The GeneXpert MRSA Assay (FDA approved for NASAL specimens only), is one component of a comprehensive MRSA colonization surveillance program. It is not intended to diagnose MRSA infection nor to guide or monitor treatment for MRSA infections. Performed at [facility], [address] [place], [place] [address]     Coagulation Studies: No results for  input(s): LABPROT, INR in the last 72 hours.  Urinalysis: No results for input(s): COLORURINE, LABSPEC, PHURINE, GLUCOSEU, HGBUR, BILIRUBINUR, KETONESUR, PROTEINUR, UROBILINOGEN, NITRITE, LEUKOCYTESUR in the last 72 hours.  Invalid input(s): APPERANCEUR    Imaging: No results found.   Medications:   . ampicillin-sulbactam (UNASYN) IV 3 g (07/11/18 1709)   . amLODipine  10 mg Oral Daily  . aspirin EC  81 mg Oral Daily  . carvedilol  12.5 mg Oral BID WC  . Chlorhexidine Gluconate Cloth  6 each Topical Q0600  . cinacalcet  30 mg Oral BH-q7a  . epoetin (EPOGEN/PROCRIT) injection  10,000 Units Intravenous Q M,W,F-HD  . ferric citrate  210 mg Oral TID WC  . heparin  5,000 Units Subcutaneous Q8H  . hydrALAZINE  50 mg Oral Q8H  . insulin aspart  0-5 Units Subcutaneous QHS  . insulin aspart  0-9 Units Subcutaneous TID WC  . methylPREDNISolone (SOLU-MEDROL) injection  80 mg Intravenous Daily  . multivitamin  1 tablet Oral Daily   acetaminophen **OR** acetaminophen, hydrALAZINE, labetalol, ondansetron **OR** ondansetron (ZOFRAN) IV, polyethylene glycol  Assessment/ Plan:  77 y.o. male with end stage renal disease on hemodialysis,  hypertension, diabetes mellitus, GERD, anemia of CKD, SHPTH, colon cancer status post resection on 10/31 by Dr. [name], CLL followed by Dr. Janese Banks, aortitis with endoleak  [facility] MWF L AVF 92kg  1.  ESRD on HD on MWF.  Patient with paralysis today.  Orders have been prepared.  Continue dialysis on an MWF schedule.  2.  Anemia chronic kidney disease. Continue Epogen 10,000 units IV with dialysis.  3.  Secondary hyperparathyroidism.  Recheck serum phosphorus today  4.  Hypertension.  Maintain the patient on amlodipine, carvedilol, hydralazine.    5.  Aortitis:  Patient to be on lifelong antibiotics along with plans for stent graft placement.   LOS: 4 Jorje Vanatta 12/27/20193:40 PM

## 2018-07-12 NOTE — Progress Notes (Signed)
Post HD assessment. Pt tolerated tx well without c/o or complications. Net UF 1516, goal met.    07/12/18 1828  Vital Signs  Temp 97.8 F (36.6 C)  Temp Source Oral  Pulse Rate 69  Pulse Rate Source Monitor  Resp 14  BP (!) 155/70  BP Location Right Arm  BP Method Automatic  Patient Position (if appropriate) Lying  Oxygen Therapy  SpO2 97 %  O2 Device Room Air  Dialysis Weight  Weight 92.8 kg  Type of Weight Post-Dialysis  Post-Hemodialysis Assessment  Rinseback Volume (mL) 250 mL  KECN 80 V  Dialyzer Clearance Lightly streaked  Duration of HD Treatment -hour(s) 3.5 hour(s)  Hemodialysis Intake (mL) 500 mL  UF Total -Machine (mL) 2016 mL  Net UF (mL) 1516 mL  Tolerated HD Treatment Yes  AVG/AVF Arterial Site Held (minutes) 10 minutes  AVG/AVF Venous Site Held (minutes) 10 minutes  Education / Care Plan  Dialysis Education Provided Yes  Documented Education in Care Plan Yes  Fistula / Graft Left Upper arm Arteriovenous fistula  No Placement Date or Time found.   Placed prior to admission: Yes  Orientation: Left  Access Location: Upper arm  Access Type: Arteriovenous fistula  Site Condition No complications  Fistula / Graft Assessment Present;Thrill;Bruit  Status Deaccessed  Drainage Description None   

## 2018-07-12 NOTE — Progress Notes (Signed)
HD tx start    07/12/18 1445  Vital Signs  Pulse Rate 63  Pulse Rate Source Monitor  Resp 17  BP (!) 128/52  BP Location Right Arm  BP Method Automatic  Patient Position (if appropriate) Lying  Oxygen Therapy  SpO2 94 %  O2 Device Room Air  During Hemodialysis Assessment  Blood Flow Rate (mL/min) 400 mL/min  Arterial Pressure (mmHg) -140 mmHg  Venous Pressure (mmHg) 160 mmHg  Transmembrane Pressure (mmHg) 60 mmHg  Ultrafiltration Rate (mL/min) 570 mL/min  Dialysate Flow Rate (mL/min) 800 ml/min  Conductivity: Machine  13.9  HD Safety Checks Performed Yes  Dialysis Fluid Bolus Normal Saline  Bolus Amount (mL) 250 mL  Intra-Hemodialysis Comments Tx initiated  Fistula / Graft Left Upper arm Arteriovenous fistula  No Placement Date or Time found.   Placed prior to admission: Yes  Orientation: Left  Access Location: Upper arm  Access Type: Arteriovenous fistula  Status Accessed  Needle Size 15

## 2018-07-12 NOTE — Progress Notes (Signed)
   Subjective/Chief Complaint: Doing OK. He noted gas type pain, now resolved. Currently without complaint. Denies pain, denies nausea.   Objective: Vital signs in last 24 hours: Temp:  [97.5 F (36.4 C)-98.3 F (36.8 C)] 97.5 F (36.4 C) (12/27 0910) Pulse Rate:  [65-68] 66 (12/27 1131) Resp:  [18-19] 18 (12/27 0910) BP: (138-162)/(62-108) 138/69 (12/27 1131) SpO2:  [91 %-100 %] 100 % (12/27 0910) Last BM Date: 07/12/18  Intake/Output from previous day: 12/26 0701 - 12/27 0700 In: 480 [P.O.:480] Out: 0  Intake/Output this shift: No intake/output data recorded.  General appearance: alert and no distress Cardio: regular rate and rhythm GI: soft, non-tender; bowel sounds normal; no masses,  no organomegaly Extremities: warm, well perfused, +pedal pulses  Lab Results:  Recent Labs    07/11/18 0633  WBC 19.5*  HGB 8.2*  HCT 25.7*  PLT 178   BMET Recent Labs    07/10/18 0454 07/11/18 0633  NA 139 137  K 3.7 3.8  CL 97* 98  CO2 28 24  GLUCOSE 159* 157*  BUN 32* 56*  CREATININE 5.19* 7.34*  CALCIUM 8.2* 7.9*   PT/INR No results for input(s): LABPROT, INR in the last 72 hours. ABG No results for input(s): PHART, HCO3 in the last 72 hours.  Invalid input(s): PCO2, PO2  Studies/Results: No results found.  Anti-infectives: Anti-infectives (From admission, onward)   Start     Dose/Rate Route Frequency Ordered Stop   07/10/18 1800  Ampicillin-Sulbactam (UNASYN) 3 g in sodium chloride 0.9 % 100 mL IVPB  Status:  Discontinued     3 g 200 mL/hr over 30 Minutes Intravenous Every 12 hours 07/10/18 1327 07/10/18 1548   07/10/18 1800  Ampicillin-Sulbactam (UNASYN) 3 g in sodium chloride 0.9 % 100 mL IVPB     3 g 200 mL/hr over 30 Minutes Intravenous Every 24 hours 07/10/18 1548     07/10/18 1200  vancomycin (VANCOCIN) IVPB 1000 mg/200 mL premix  Status:  Discontinued     1,000 mg 200 mL/hr over 60 Minutes Intravenous Every M-W-F (Hemodialysis) 07/08/18 1941  07/09/18 1242   07/09/18 1241  vancomycin (VANCOCIN) IVPB 1000 mg/200 mL premix  Status:  Discontinued     1,000 mg 200 mL/hr over 60 Minutes Intravenous Every Dialysis 07/09/18 1242 07/10/18 1323   07/08/18 1915  piperacillin-tazobactam (ZOSYN) IVPB 3.375 g  Status:  Discontinued     3.375 g 12.5 mL/hr over 240 Minutes Intravenous Every 12 hours 07/08/18 1909 07/10/18 1327   07/08/18 1915  vancomycin (VANCOCIN) 2,000 mg in sodium chloride 0.9 % 500 mL IVPB     2,000 mg 250 mL/hr over 120 Minutes Intravenous  Once 07/08/18 1913 07/08/18 2321      Assessment/Plan:  Aortitis- C.Septicum Bacteremia  Plan for Endovascular intervention- stenting on Monday per Dr. Dew/Dr. Delana Meyer Extensive discussion with patient and Son Jonathan Mcgrath regarding findings, results, treatment options and potential outcomes. I explained that the endovascular option would be the best and least morbid vs. Open aortic surgery. In addition, I explained that he would require lifelong antibiotics directed by Infectious Disease.  I explained risks, benefits and alternatives to the proposed plan. All questions answered. Understanding expressed. .  LOS: 4 days    Jonathan Mcgrath A 07/12/2018

## 2018-07-12 NOTE — Progress Notes (Signed)
Pt switched to 3K bath from 2K bath r/t a potassium of 3.8, MD aware.    07/12/18 1445  Vital Signs  Pulse Rate 63  Pulse Rate Source Monitor  Resp 17  BP (!) 128/52  BP Location Right Arm  BP Method Automatic  Patient Position (if appropriate) Lying  Oxygen Therapy  SpO2 94 %  O2 Device Room Air  Pre Treatment Patient Checks  Dialysate 3K, 2.5 Ca (potassium 3.8, switch to 3K, MD aware)  During Hemodialysis Assessment  Blood Flow Rate (mL/min) 400 mL/min  Arterial Pressure (mmHg) -140 mmHg  Venous Pressure (mmHg) 160 mmHg  Transmembrane Pressure (mmHg) 60 mmHg  Ultrafiltration Rate (mL/min) 570 mL/min  Dialysate Flow Rate (mL/min) 800 ml/min  Conductivity: Machine  13.9  HD Safety Checks Performed Yes  Dialysis Fluid Bolus Normal Saline  Bolus Amount (mL) 250 mL  Intra-Hemodialysis Comments Tx initiated  Fistula / Graft Left Upper arm Arteriovenous fistula  No Placement Date or Time found.   Placed prior to admission: Yes  Orientation: Left  Access Location: Upper arm  Access Type: Arteriovenous fistula  Status Accessed  Needle Size 15

## 2018-07-12 NOTE — Progress Notes (Signed)
[place] [facility] [place] NAME: Shawna Kiener    MR#:  [id number]  DATE OF BIRTH:  21-Dec-1940  SUBJECTIVE:   patient doing well this morning without abdominal pain  REVIEW OF SYSTEMS:    Review of Systems  Constitutional: Negative for fever, chills weight loss HENT: Negative for ear pain, nosebleeds, congestion, facial swelling, rhinorrhea, neck pain, neck stiffness and ear discharge.   Respiratory: Negative for cough, shortness of breath, wheezing  Cardiovascular: Negative for chest pain, palpitations and leg swelling.  Gastrointestinal: Negative for heartburn, abdominal pain, vomiting, diarrhea or consitpation Genitourinary: Negative for dysuria, urgency, frequency, hematuria Musculoskeletal: Negative for back pain or joint pain Neurological: Negative for dizziness, seizures, syncope, focal weakness,  numbness and headaches.  Hematological: Does not bruise/bleed easily.  Psychiatric/Behavioral: Negative for hallucinations, confusion, dysphoric mood    Tolerating Diet: yes      DRUG ALLERGIES:  No Known Allergies  VITALS:  Blood pressure (!) 162/108, pulse 67, temperature (!) 97.5 F (36.4 C), temperature source Oral, resp. rate 18, height 6\' 1"  (1.854 m), weight 90.7 kg, SpO2 100 %.  PHYSICAL EXAMINATION:  Constitutional: Appears well-developed and well-nourished. No distress. HENT: Normocephalic. Marland Kitchen Oropharynx is clear and moist.  Eyes: Conjunctivae and EOM are normal. PERRLA, no scleral icterus.  Neck: Normal ROM. Neck supple. No JVD. No tracheal deviation. CVS: RRR, S1/S2 +, no murmurs, no gallops, no carotid bruit.  Pulmonary: Effort and breath sounds normal, no stridor, rhonchi, wheezes, rales.  Abdominal: Soft. BS +,  no distension, tenderness, rebound or guarding.  Musculoskeletal: Normal range of motion. No edema and no tenderness.  Neuro: Alert. CN 2-12 grossly intact. No focal deficits. Skin: Skin is warm and dry. No  rash noted. Psychiatric: Normal mood and affect.      LABORATORY PANEL:   CBC Recent Labs  Lab 07/11/18 0633  WBC 19.5*  HGB 8.2*  HCT 25.7*  PLT 178   ------------------------------------------------------------------------------------------------------------------  Chemistries  Recent Labs  Lab 07/08/18 1227  07/11/18 0633  NA 139   < > 137  K 3.5   < > 3.8  CL 100   < > 98  CO2 29   < > 24  GLUCOSE 123*   < > 157*  BUN 28*   < > 56*  CREATININE 5.41*   < > 7.34*  CALCIUM 8.3*   < > 7.9*  AST 23  --   --   ALT 19  --   --   ALKPHOS 95  --   --   BILITOT 1.2  --   --    < > = values in this interval not displayed.   ------------------------------------------------------------------------------------------------------------------  Cardiac Enzymes No results for input(s): TROPONINI in the last 168 hours. ------------------------------------------------------------------------------------------------------------------  RADIOLOGY:  No results found.   ASSESSMENT AND PLAN:   77 year old male with a history of colon cancer status post hemicolectomy October 2019, diabetes and end-stage renal disease on hemodialysis who presented to the emergency room due to abdominal pain.  1. 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 Patient has overall poor prognosis Best option is lifelong antibiotics and endovascular aortic graft to prevent rupture Plan for intervention next week with vascular surgery and HOHN catheter for long term antibiotics Continue Unasyn Continue IV steroids as per vascular surgery (Wean  after surgery as per Dr Pearlie Oyster)   2.  Acute hypoxic respiratory failure due to acute diastolic heart failure and volume overload due to end-stage renal disease Patient has been weaned  off of oxygen  3.  End-stage renal disease on hemodialysis: Continue dialysis as per recommendations by nephrology  4.  Essential hypertension: Blood pressure ideally should be systolic less than 188 per vascular surgery recommendations. Continue Norvasc and Coreg, hydralazine Blood pressure still not ideally controlled and therefore I will increase hydralazine.  5.  Diabetes: Continue sliding scale 6.  Anemia chronic disease with stable hemoglobin Continue iron supplement  Palliative care consultation placed  I Called patient's daughter however mailbox is full.   Management plans discussed with the patient and he is in agreement.  CODE STATUS: Partial/DNI  TOTAL TIME TAKING CARE OF THIS PATIENT: 24 minutes.     POSSIBLE D/C next week DEPENDING ON CLINICAL CONDITION.   Brevan Luberto M.D on 07/12/2018 at 11:02 AM  Between 7am to 6pm - Pager - [phone] After 6pm go to [email or web address] - password [email or web address] [facility]  Office  [phone]  CC: Primary care physician; [employer]  Note: This dictation was prepared with Diplomatic Services operational officer dictation along with smaller Company secretary. Any transcriptional errors that result from this process are unintentional.

## 2018-07-12 NOTE — Progress Notes (Signed)
HD tx end   07/12/18 1823  Vital Signs  Pulse Rate 72  Pulse Rate Source Monitor  Resp 13  BP (!) 153/75  BP Location Right Arm  BP Method Automatic  Patient Position (if appropriate) Lying  Oxygen Therapy  SpO2 96 %  O2 Device Room Air  During Hemodialysis Assessment  Dialysis Fluid Bolus Normal Saline  Bolus Amount (mL) 250 mL  Intra-Hemodialysis Comments Tx completed

## 2018-07-12 NOTE — Progress Notes (Signed)
Pre HD assessment    07/12/18 1435  Vital Signs  Temp 97.6 F (36.4 C)  Temp Source Oral  Pulse Rate 62  Pulse Rate Source Monitor  Resp 16  BP (!) 130/56  BP Location Right Arm  BP Method Automatic  Patient Position (if appropriate) Lying  Oxygen Therapy  SpO2 96 %  O2 Device Room Air  Pain Assessment  Pain Scale 0-10  Pain Score 0  Dialysis Weight  Weight 93.7 kg  Type of Weight Pre-Dialysis  Time-Out for Hemodialysis  What Procedure? HD  Pt Identifiers(min of two) First/Last Name;MRN/Account#  Correct Site? Yes  Correct Side? Yes  Correct Procedure? Yes  Consents Verified? Yes  Rad Studies Available? N/A  Safety Precautions Reviewed? Yes  Engineer, civil (consulting) Number  ([id number])  Station Number 1  UF/Alarm Test Passed  Conductivity: Meter 13.6  Conductivity: Machine  13.8  pH 7.4  Reverse Osmosis main  Normal Saline Lot Number [id number]  Dialyzer Lot Number [id number]  Disposable Set Lot Number [id number]  Machine Temperature 98.6 F (37 C)  Musician and Audible Yes  Blood Lines Intact and Secured Yes  Pre Treatment Patient Checks  Vascular access used during treatment Fistula  Hepatitis B Surface Antigen Results Negative  Date Hepatitis B Surface Antigen Drawn 09/05/17  Hepatitis B Surface Antibody  (366>10)  Date Hepatitis B Surface Antibody Drawn 09/05/17  Hemodialysis Consent Verified Yes  Hemodialysis Standing Orders Initiated Yes  ECG (Telemetry) Monitor On Yes  Prime Ordered Normal Saline  Length of  DialysisTreatment -hour(s) 3.5 Hour(s)  Dialyzer Elisio 17H NR  Dialysate 2K, 2.5 Ca  Dialysis Anticoagulant None  Dialysate Flow Ordered 800  Blood Flow Rate Ordered 400 mL/min  Ultrafiltration Goal 1.5 Liters  Pre Treatment Labs Renal panel;CBC (BMP)  Dialysis Blood Pressure Support Ordered Normal Saline  Education / Care Plan  Dialysis Education Provided Yes  Documented Education in Care Plan Yes  Fistula / Graft Left Upper arm  Arteriovenous fistula  No Placement Date or Time found.   Placed prior to admission: Yes  Orientation: Left  Access Location: Upper arm  Access Type: Arteriovenous fistula  Site Condition No complications  Fistula / Graft Assessment Present;Thrill;Bruit  Drainage Description None

## 2018-07-12 NOTE — Progress Notes (Signed)
Post HD assessment    07/12/18 1827  Neurological  Level of Consciousness Alert  Orientation Level Oriented X4  Respiratory  Respiratory Pattern Regular;Unlabored  Chest Assessment Chest expansion symmetrical  Cardiac  Pulse Regular  ECG Monitor Yes  Cardiac Rhythm NSR  Vascular  R Radial Pulse +2  L Radial Pulse +2  Edema Generalized  Integumentary  Integumentary (WDL) X  Skin Color Appropriate for ethnicity  Musculoskeletal  Musculoskeletal (WDL) X  Generalized Weakness Yes  Assistive Device None  GU Assessment  Genitourinary (WDL) X  Genitourinary Symptoms  (HD)  Psychosocial  Psychosocial (WDL) WDL

## 2018-07-12 NOTE — Progress Notes (Signed)
Notified by Hemodialysis RN that pt BP is slowly rising, >140, IV hydralazine PRN given to pt by this RN in Dialysis.

## 2018-07-12 NOTE — Progress Notes (Signed)
Pre HD assessment    07/12/18 1436  Neurological  Level of Consciousness Alert  Orientation Level Oriented X4  Respiratory  Respiratory Pattern Regular;Unlabored  Chest Assessment Chest expansion symmetrical  Cardiac  Pulse Regular  ECG Monitor Yes  Cardiac Rhythm NSR  Vascular  R Radial Pulse +2  L Radial Pulse +2  Edema Generalized  Integumentary  Integumentary (WDL) X  Skin Color Appropriate for ethnicity  Musculoskeletal  Musculoskeletal (WDL) X  Generalized Weakness Yes  Assistive Device None  GU Assessment  Genitourinary (WDL) X  Genitourinary Symptoms  (HD)  Psychosocial  Psychosocial (WDL) WDL

## 2018-07-12 NOTE — Plan of Care (Addendum)
Palliative: Phone conference with vascular surgeon, Dr. Lorenso Courier for information gathering related to endovascular procedure next week. Case discussed and Dr. Lorenso Courier requests that Palliative Medicine hold discussions until next week after procedure. Dr. Benjie Karvonen aware.  PMT will continue to shadow chart and re-engage where appropriate.  No charge  Quinn Axe, NP Palliative Medicine Team [phone] Greater than 50% of this time was spent counseling and coordination care related to the above assessment and plan.

## 2018-07-13 LAB — CULTURE, BLOOD (ROUTINE X 2)
Culture: NO GROWTH
Culture: NO GROWTH

## 2018-07-13 LAB — GLUCOSE, CAPILLARY
Glucose-Capillary: 102 mg/dL — ABNORMAL HIGH (ref 70–99)
Glucose-Capillary: 181 mg/dL — ABNORMAL HIGH (ref 70–99)
Glucose-Capillary: 222 mg/dL — ABNORMAL HIGH (ref 70–99)
Glucose-Capillary: 300 mg/dL — ABNORMAL HIGH (ref 70–99)
Glucose-Capillary: 281 mg/dL — ABNORMAL HIGH (ref 70–99)

## 2018-07-13 LAB — BASIC METABOLIC PANEL
Anion gap: 11 (ref 5–15)
BUN: 41 mg/dL — ABNORMAL HIGH (ref 8–23)
CO2: 29 mmol/L (ref 22–32)
Calcium: 7.6 mg/dL — ABNORMAL LOW (ref 8.9–10.3)
Chloride: 99 mmol/L (ref 98–111)
Creatinine, Ser: 5.84 mg/dL — ABNORMAL HIGH (ref 0.61–1.24)
GFR calc Af Amer: 10 mL/min — ABNORMAL LOW (ref >60)
GFR calc non Af Amer: 9 mL/min — ABNORMAL LOW (ref >60)
Glucose, Bld: 135 mg/dL — ABNORMAL HIGH (ref 70–99)
Potassium: 3.6 mmol/L (ref 3.5–5.1)
Sodium: 139 mmol/L (ref 135–145)

## 2018-07-13 NOTE — Progress Notes (Signed)
[facility]  ROUNDING NOTE   Subjective:  Had HD yesterday and tolerated well.  Resting comfortably.   Objective:  Vital signs in last 24 hours:  Temp:  [97.6 F (36.4 C)-98.2 F (36.8 C)] 98.2 F (36.8 C) (12/28 1010) Pulse Rate:  [62-72] 64 (12/28 1010) Resp:  [11-20] 20 (12/28 0500) BP: (121-164)/(52-124) 156/66 (12/28 1010) SpO2:  [93 %-97 %] 93 % (12/28 1010) Weight:  [90.3 kg-93.7 kg] 90.3 kg (12/28 0500)  Weight change:  Filed Weights   07/12/18 1435 07/12/18 1828 07/13/18 0500  Weight: 93.7 kg 92.8 kg 90.3 kg    Intake/Output: I/O last 3 completed shifts: In: -  Out: 1516 [Other:1516]   Intake/Output this shift:  No intake/output data recorded.  Physical Exam: General: No acute distress  Head: Normocephalic, atraumatic. Moist oral mucosal membranes  Eyes: Anicteric  Neck: Supple, trachea midline  Lungs:  Basilar rales, normal effort  Heart: S1S2 no rubs  Abdomen:  Soft, nontender, bowel sounds present  Extremities: trace peripheral edema.  Neurologic: Awake, alert, following commands  Skin: No lesions  Access: LUE AV access    Basic Metabolic Panel: Recent Labs  Lab 07/09/18 0317 07/10/18 0454 07/11/18 0633 07/12/18 1605 07/13/18 0341  NA 139 139 137 135 139  K 3.3* 3.7 3.8 4.0 3.6  CL 101 97* 98 96* 99  CO2 27 28 24 24 29   GLUCOSE 96 159* 157* 211* 135*  BUN 34* 32* 56* 80* 41*  CREATININE 6.39* 5.19* 7.34* 9.37* 5.84*  CALCIUM 8.4* 8.2* 7.9* 7.4* 7.6*    Liver Function Tests: Recent Labs  Lab 07/08/18 1227  AST 23  ALT 19  ALKPHOS 95  BILITOT 1.2  PROT 6.2*  ALBUMIN 3.4*   Recent Labs  Lab 07/08/18 1227  LIPASE 29   No results for input(s): AMMONIA in the last 168 hours.  CBC: Recent Labs  Lab 07/08/18 1227 07/09/18 0317 07/11/18 0633  WBC 15.4* 15.7* 19.5*  NEUTROABS 8.7*  --   --   HGB 8.3* 8.0* 8.2*  HCT 26.7* 25.2* 25.7*  MCV 95.4 92.3 90.8  PLT 148* 127* 178    Cardiac Enzymes: No results  for input(s): CKTOTAL, CKMB, CKMBINDEX, TROPONINI in the last 168 hours.  BNP: Invalid input(s): POCBNP  CBG: Recent Labs  Lab 07/12/18 1142 07/12/18 1848 07/12/18 2051 07/13/18 0738 07/13/18 1136  GLUCAP 173* 170* 216* 102* 181*    Microbiology: Results for orders placed or performed during the hospital encounter of 07/08/18  CULTURE, BLOOD (ROUTINE X 2) w Reflex to ID Panel     Status: None   Collection Time: 07/08/18  8:49 PM  Result Value Ref Range Status   Specimen Description BLOOD BLOOD RIGHT HAND  Final   Special Requests   Final    BOTTLES DRAWN AEROBIC AND ANAEROBIC Blood Culture results may not be optimal due to an excessive volume of blood received in culture bottles   Culture   Final    NO GROWTH 5 DAYS Performed at [facility], [address] [place], [place] [address]    Report Status 07/13/2018 FINAL  Final  CULTURE, BLOOD (ROUTINE X 2) w Reflex to ID Panel     Status: None   Collection Time: 07/08/18  8:49 PM  Result Value Ref Range Status   Specimen Description BLOOD FINGER  Final   Special Requests   Final    BOTTLES DRAWN AEROBIC AND ANAEROBIC Blood Culture results may not be optimal due to  an excessive volume of blood received in culture bottles   Culture   Final    NO GROWTH 5 DAYS Performed at [facility], [address] [place], [place] [address]    Report Status 07/13/2018 FINAL  Final  MRSA PCR Screening     Status: None   Collection Time: 07/09/18 12:33 AM  Result Value Ref Range Status   MRSA by PCR NEGATIVE NEGATIVE Final    Comment:        The GeneXpert MRSA Assay (FDA approved for NASAL specimens only), is one component of a comprehensive MRSA colonization surveillance program. It is not intended to diagnose MRSA infection nor to guide or monitor treatment for MRSA infections. Performed at [facility], [address] [place], [place] [address]     Coagulation Studies: No results for input(s):  LABPROT, INR in the last 72 hours.  Urinalysis: No results for input(s): COLORURINE, LABSPEC, PHURINE, GLUCOSEU, HGBUR, BILIRUBINUR, KETONESUR, PROTEINUR, UROBILINOGEN, NITRITE, LEUKOCYTESUR in the last 72 hours.  Invalid input(s): APPERANCEUR    Imaging: No results found.   Medications:   . ampicillin-sulbactam (UNASYN) IV 3 g (07/12/18 1858)   . amLODipine  10 mg Oral Daily  . aspirin EC  81 mg Oral Daily  . carvedilol  12.5 mg Oral BID WC  . Chlorhexidine Gluconate Cloth  6 each Topical Q0600  . cinacalcet  30 mg Oral BH-q7a  . epoetin (EPOGEN/PROCRIT) injection  10,000 Units Intravenous Q M,W,F-HD  . ferric citrate  210 mg Oral TID WC  . heparin  5,000 Units Subcutaneous Q8H  . hydrALAZINE  50 mg Oral Q8H  . insulin aspart  0-5 Units Subcutaneous QHS  . insulin aspart  0-9 Units Subcutaneous TID WC  . methylPREDNISolone (SOLU-MEDROL) injection  80 mg Intravenous Daily  . multivitamin  1 tablet Oral Daily   acetaminophen **OR** acetaminophen, hydrALAZINE, labetalol, ondansetron **OR** ondansetron (ZOFRAN) IV, polyethylene glycol  Assessment/ Plan:  77 y.o. male with end stage renal disease on hemodialysis,  hypertension, diabetes mellitus, GERD, anemia of CKD, SHPTH, colon cancer status post resection on 10/31 by Dr. [name], CLL followed by Dr. Janese Banks, aortitis with endoleak  [facility] MWF L AVF 92kg  1.  ESRD on HD on MWF.  No acute indication for HD today, will plan for HD again on Monday.  2.  Anemia chronic kidney disease. Continue Epogen 10,000 units IV with dialysis.  3.  Secondary hyperparathyroidism.  Continue auryxia and sensipar.    4.  Hypertension.  Maintain the patient on amlodipine, carvedilol, hydralazine.    5.  Aortitis:  Patient to be on lifelong antibiotics along with plans for stent graft placement on Monday.   LOS: 5 Ty Oshima 12/28/20191:26 PM

## 2018-07-13 NOTE — Plan of Care (Signed)
  Problem: Safety: Goal: Ability to remain free from injury will improve Outcome: Progressing   

## 2018-07-13 NOTE — Progress Notes (Addendum)
[facility] [place] NAME: Jonathan Mcgrath    MR#:  [id number]  DATE OF BIRTH:  January 27, 1941  SUBJECTIVE:  Patient seen and eval noted today No complaints of abdominal pain No nausea and vomiting  REVIEW OF SYSTEMS:    Review of Systems  Constitutional: Negative for fever, chills weight loss HENT: Negative for ear pain, nosebleeds, congestion, facial swelling, rhinorrhea, neck pain, neck stiffness and ear discharge.   Respiratory: Negative for cough, shortness of breath, wheezing  Cardiovascular: Negative for chest pain, palpitations and leg swelling.  Gastrointestinal: Negative for heartburn, abdominal pain, vomiting, diarrhea or consitpation Genitourinary: Negative for dysuria, urgency, frequency, hematuria Musculoskeletal: Negative for back pain or joint pain Neurological: Negative for dizziness, seizures, syncope, focal weakness,  numbness and headaches.  Hematological: Does not bruise/bleed easily.  Psychiatric/Behavioral: Negative for hallucinations, confusion, dysphoric mood  Tolerating Diet: yes   DRUG ALLERGIES:  No Known Allergies  VITALS:  Blood pressure (!) 156/66, pulse 64, temperature 98.2 F (36.8 C), temperature source Oral, resp. rate 20, height 6\' 1"  (1.854 m), weight 90.3 kg, SpO2 93 %.  PHYSICAL EXAMINATION:  Constitutional: Appears well-developed and well-nourished. No distress. HENT: Normocephalic. Marland Kitchen Oropharynx is clear and moist.  Eyes: Conjunctivae and EOM are normal. PERRLA, no scleral icterus.  Neck: Normal ROM. Neck supple. No JVD. No tracheal deviation. CVS: RRR, S1/S2 +, no murmurs, no gallops, no carotid bruit.  Pulmonary: Effort and breath sounds normal, no stridor, rhonchi, wheezes, rales.  Abdominal: Soft. BS +,  no distension, tenderness, rebound or guarding.  Musculoskeletal: Normal range of motion. No edema and no tenderness.  Neuro: Alert. CN 2-12 grossly intact. No focal deficits. Skin: Skin is  warm and dry. No rash noted. Psychiatric: Normal mood and affect.      LABORATORY PANEL:   CBC Recent Labs  Lab 07/11/18 0633  WBC 19.5*  HGB 8.2*  HCT 25.7*  PLT 178   ------------------------------------------------------------------------------------------------------------------  Chemistries  Recent Labs  Lab 07/08/18 1227  07/13/18 0341  NA 139   < > 139  K 3.5   < > 3.6  CL 100   < > 99  CO2 29   < > 29  GLUCOSE 123*   < > 135*  BUN 28*   < > 41*  CREATININE 5.41*   < > 5.84*  CALCIUM 8.3*   < > 7.6*  AST 23  --   --   ALT 19  --   --   ALKPHOS 95  --   --   BILITOT 1.2  --   --    < > = values in this interval not displayed.   ------------------------------------------------------------------------------------------------------------------  Cardiac Enzymes No results for input(s): TROPONINI in the last 168 hours. ------------------------------------------------------------------------------------------------------------------  RADIOLOGY:  No results found.   ASSESSMENT AND PLAN:   77 year old male patient with a history of colon cancer status post hemicolectomy October 2019, diabetes and end-stage renal disease on hemodialysis who presented to the emergency room due to abdominal pain.  1. 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 Patient has overall poor prognosis Best option is lifelong antibiotics and endovascular aortic graft to prevent rupture Plan for intervention next week with vascular surgery and HOHN catheter for long term antibiotics Continue Unasyn Continue IV steroids as  per vascular surgery (Wean after surgery as per Dr Pearlie Oyster)   2.  s/p hypoxic respiratory failure due to acute diastolic heart failure and volume overload due to end-stage  renal disease Patient has been weaned off of oxygen  3.  End-stage renal disease on hemodialysis: Continue dialysis as per recommendations by nephrology that he is on Monday Wednesday and Friday  4.  Essential hypertension: Blood pressure ideally should be systolic less than 128 per vascular surgery recommendations. Continue Norvasc and Coreg, hydralazine  5.  Diabetes: Continue sliding scale  6.  Anemia chronic disease with stable hemoglobin Continue iron supplement  7.  Leukocytosis secondary to steroids  8.Palliative care team on board   Management plans discussed with the patient and he is in agreement.  CODE STATUS: Partial/DNI  TOTAL TIME TAKING CARE OF THIS PATIENT: 33 minutes.     POSSIBLE D/C next week DEPENDING ON CLINICAL CONDITION.   Saundra Shelling M.D on 07/13/2018 at 1:46 PM  Between 7am to 6pm - Pager - [phone] After 6pm go to [email or web address] - password [email or web address] [facility]  Office  [phone]  CC: Primary care physician; [employer]  Note: This dictation was prepared with Diplomatic Services operational officer dictation along with smaller Company secretary. Any transcriptional errors that result from this process are unintentional.

## 2018-07-14 LAB — GLUCOSE, CAPILLARY
Glucose-Capillary: 131 mg/dL — ABNORMAL HIGH (ref 70–99)
Glucose-Capillary: 175 mg/dL — ABNORMAL HIGH (ref 70–99)
Glucose-Capillary: 188 mg/dL — ABNORMAL HIGH (ref 70–99)
Glucose-Capillary: 184 mg/dL — ABNORMAL HIGH (ref 70–99)

## 2018-07-14 LAB — BASIC METABOLIC PANEL
Anion gap: 11 (ref 5–15)
BUN: 62 mg/dL — ABNORMAL HIGH (ref 8–23)
CO2: 29 mmol/L (ref 22–32)
Calcium: 7.3 mg/dL — ABNORMAL LOW (ref 8.9–10.3)
Chloride: 98 mmol/L (ref 98–111)
Creatinine, Ser: 7.31 mg/dL — ABNORMAL HIGH (ref 0.61–1.24)
GFR calc Af Amer: 8 mL/min — ABNORMAL LOW (ref >60)
GFR calc non Af Amer: 7 mL/min — ABNORMAL LOW (ref >60)
Glucose, Bld: 176 mg/dL — ABNORMAL HIGH (ref 70–99)
Potassium: 3.8 mmol/L (ref 3.5–5.1)
Sodium: 138 mmol/L (ref 135–145)

## 2018-07-14 LAB — CBC
HCT: 28.4 % — ABNORMAL LOW (ref 39.0–52.0)
Hemoglobin: 9.1 g/dL — ABNORMAL LOW (ref 13.0–17.0)
MCH: 28.4 pg (ref 26.0–34.0)
MCHC: 32 g/dL (ref 30.0–36.0)
MCV: 88.8 fL (ref 80.0–100.0)
Platelets: 213 10*3/uL (ref 150–400)
RBC: 3.2 MIL/uL — ABNORMAL LOW (ref 4.22–5.81)
RDW: 17.9 % — ABNORMAL HIGH (ref 11.5–15.5)
WBC: 27.1 10*3/uL — ABNORMAL HIGH (ref 4.0–10.5)
nRBC: 0.3 % — ABNORMAL HIGH (ref 0.0–0.2)

## 2018-07-14 LAB — TYPE AND SCREEN
ABO/RH(D): O POS
Antibody Screen: NEGATIVE

## 2018-07-14 MED ORDER — METHYLPREDNISOLONE SODIUM SUCC 40 MG IJ SOLR
40.0000 mg | Freq: Every day | INTRAMUSCULAR | Status: DC
  Administered 2018-07-16: 40 mg via INTRAVENOUS
  Filled 2018-07-14 (×2): qty 1

## 2018-07-14 NOTE — Progress Notes (Signed)
   Subjective/Chief Complaint: Doing OK. Denies abdominal Pain.   Objective: Vital signs in last 24 hours: Temp:  [97.5 F (36.4 C)-98.1 F (36.7 C)] 97.8 F (36.6 C) (12/29 0729) Pulse Rate:  [62-68] 64 (12/29 0729) Resp:  [18] 18 (12/29 0454) BP: (119-155)/(49-71) 155/71 (12/29 0729) SpO2:  [92 %-96 %] 96 % (12/29 0729) Weight:  [92.8 kg] 92.8 kg (12/29 0454) Last BM Date: 07/14/18  Intake/Output from previous day: No intake/output data recorded. Intake/Output this shift: No intake/output data recorded.  General appearance: alert and no distress GI: soft, non-tender; bowel sounds normal; no masses,  no organomegaly Extremities: extremities normal, atraumatic, no cyanosis or edema  Lab Results:  Recent Labs    07/14/18 0408  WBC 27.1*  HGB 9.1*  HCT 28.4*  PLT 213   BMET Recent Labs    07/13/18 0341 07/14/18 0408  NA 139 138  K 3.6 3.8  CL 99 98  CO2 29 29  GLUCOSE 135* 176*  BUN 41* 62*  CREATININE 5.84* 7.31*  CALCIUM 7.6* 7.3*   PT/INR No results for input(s): LABPROT, INR in the last 72 hours. ABG No results for input(s): PHART, HCO3 in the last 72 hours.  Invalid input(s): PCO2, PO2  Studies/Results: No results found.  Anti-infectives: Anti-infectives (From admission, onward)   Start     Dose/Rate Route Frequency Ordered Stop   07/10/18 1800  Ampicillin-Sulbactam (UNASYN) 3 g in sodium chloride 0.9 % 100 mL IVPB  Status:  Discontinued     3 g 200 mL/hr over 30 Minutes Intravenous Every 12 hours 07/10/18 1327 07/10/18 1548   07/10/18 1800  Ampicillin-Sulbactam (UNASYN) 3 g in sodium chloride 0.9 % 100 mL IVPB     3 g 200 mL/hr over 30 Minutes Intravenous Every 24 hours 07/10/18 1548     07/10/18 1200  vancomycin (VANCOCIN) IVPB 1000 mg/200 mL premix  Status:  Discontinued     1,000 mg 200 mL/hr over 60 Minutes Intravenous Every M-W-F (Hemodialysis) 07/08/18 1941 07/09/18 1242   07/09/18 1241  vancomycin (VANCOCIN) IVPB 1000 mg/200 mL  premix  Status:  Discontinued     1,000 mg 200 mL/hr over 60 Minutes Intravenous Every Dialysis 07/09/18 1242 07/10/18 1323   07/08/18 1915  piperacillin-tazobactam (ZOSYN) IVPB 3.375 g  Status:  Discontinued     3.375 g 12.5 mL/hr over 240 Minutes Intravenous Every 12 hours 07/08/18 1909 07/10/18 1327   07/08/18 1915  vancomycin (VANCOCIN) 2,000 mg in sodium chloride 0.9 % 500 mL IVPB     2,000 mg 250 mL/hr over 120 Minutes Intravenous  Once 07/08/18 1913 07/08/18 2321      Assessment/Plan:  Aortitis C. Septicum Bacteremia  Plan for Bilateral Iliac stenting for aortitis tomorrow.  NPO after MN   LOS: 6 days    Jamesetta So A 07/14/2018

## 2018-07-14 NOTE — Plan of Care (Signed)
  Problem: Education: Goal: Knowledge of General Education information will improve Description: Including pain rating scale, medication(s)/side effects and non-pharmacologic comfort measures Outcome: Progressing   Problem: Health Behavior/Discharge Planning: Goal: Ability to manage health-related needs will improve Outcome: Progressing   Problem: Clinical Measurements: Goal: Ability to maintain clinical measurements within normal limits will improve Outcome: Progressing Goal: Diagnostic test results will improve Outcome: Progressing   Problem: Pain Managment: Goal: General experience of comfort will improve Outcome: Progressing   

## 2018-07-14 NOTE — Progress Notes (Signed)
Jonathan Mcgrath    MR#:  [id number]  DATE OF BIRTH:  1940/09/07  SUBJECTIVE:  Patient seen and eval noted today No complaints of abdominal pain No fever and chills No chest pain No nausea and vomiting Blood sugars are elevated secondary to steroids  REVIEW OF SYSTEMS:    Review of Systems  Constitutional: Negative for fever, chills weight loss HENT: Negative for ear pain, nosebleeds, congestion, facial swelling, rhinorrhea, neck pain, neck stiffness and ear discharge.   Respiratory: Negative for cough, shortness of breath, wheezing  Cardiovascular: Negative for chest pain, palpitations and leg swelling.  Gastrointestinal: Negative for heartburn, abdominal pain, vomiting, diarrhea or consitpation Genitourinary: Negative for dysuria, urgency, frequency, hematuria Musculoskeletal: Negative for back pain or joint pain Neurological: Negative for dizziness, seizures, syncope, focal weakness,  numbness and headaches.  Hematological: Does not bruise/bleed easily.  Psychiatric/Behavioral: Negative for hallucinations, confusion, dysphoric mood  Tolerating Diet: yes   DRUG ALLERGIES:  No Known Allergies  VITALS:  Blood pressure (!) 155/71, pulse 64, temperature 97.8 F (36.6 C), temperature source Oral, resp. rate 18, height 6\' 1"  (1.854 m), weight 92.8 kg, SpO2 96 %.  PHYSICAL EXAMINATION:  Constitutional: Appears well-developed and well-nourished. No distress. HENT: Normocephalic. Marland Kitchen Oropharynx is clear and moist.  Eyes: Conjunctivae and EOM are normal. PERRLA, no scleral icterus.  Neck: Normal ROM. Neck supple. No JVD. No tracheal deviation. CVS: RRR, S1/S2 +, no murmurs, no gallops, no carotid bruit.  Pulmonary: Effort and breath sounds normal, no stridor, rhonchi, wheezes, rales.  Abdominal: Soft. BS +,  no distension, tenderness, rebound or guarding.  Musculoskeletal: Normal range of motion. No edema and no  tenderness.  Neuro: Alert. CN 2-12 grossly intact. No focal deficits. Skin: Skin is warm and dry. No rash noted. Psychiatric: Normal mood and affect.      LABORATORY PANEL:   CBC Recent Labs  Lab 07/14/18 0408  WBC 27.1*  HGB 9.1*  HCT 28.4*  PLT 213   ------------------------------------------------------------------------------------------------------------------  Chemistries  Recent Labs  Lab 07/08/18 1227  07/14/18 0408  NA 139   < > 138  K 3.5   < > 3.8  CL 100   < > 98  CO2 29   < > 29  GLUCOSE 123*   < > 176*  BUN 28*   < > 62*  CREATININE 5.41*   < > 7.31*  CALCIUM 8.3*   < > 7.3*  AST 23  --   --   ALT 19  --   --   ALKPHOS 95  --   --   BILITOT 1.2  --   --    < > = values in this interval not displayed.   ------------------------------------------------------------------------------------------------------------------  Cardiac Enzymes No results for input(s): TROPONINI in the last 168 hours. ------------------------------------------------------------------------------------------------------------------  RADIOLOGY:  No results found.   ASSESSMENT AND PLAN:   77 year old male patient with a history of colon cancer status post hemicolectomy October 2019, diabetes and end-stage renal disease on hemodialysis who presented to the emergency room due to abdominal pain.  1. 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 Best option is lifelong antibiotics and endovascular aortic graft to prevent rupture Bilateral iliac artery stents placement tomorrow by vascular  surgery for aortitis Continue Unasyn antibiotic intravenously Will wean IV Solu-Medrol  2.  s/p hypoxic respiratory failure due to acute diastolic heart failure and volume  overload due to end-stage renal disease Patient has been weaned off of oxygen  3.  End-stage renal disease on hemodialysis: Continue dialysis as per recommendations by nephrology that he is on Monday Wednesday and Friday  4.  Essential hypertension: Blood pressure ideally should be systolic less than 147 per vascular surgery recommendations. Continue Norvasc and Coreg, hydralazine  5.  Diabetes: Continue sliding scale  6.  Anemia chronic disease with stable hemoglobin Continue iron supplement  7.  Leukocytosis secondary to steroids  8.Palliative care team on board   Management plans discussed with the patient and he is in agreement.  CODE STATUS: Partial/DNI  TOTAL TIME TAKING CARE OF THIS PATIENT: 34 minutes.     POSSIBLE D/C next week DEPENDING ON CLINICAL CONDITION.   Saundra Shelling M.D on 07/14/2018 at 12:21 PM  Between 7am to 6pm - Pager - [phone] After 6pm go to [email or web address] - password [email or web address] [facility]  Office  [phone]  CC: Primary care physician; [employer]  Note: This dictation was prepared with Diplomatic Services operational officer dictation along with smaller Company secretary. Any transcriptional errors that result from this process are unintentional.

## 2018-07-14 NOTE — Progress Notes (Signed)
[facility]  ROUNDING NOTE   Subjective:  Patient seen at bedside. Due for dialysis tomorrow. 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   Objective:  Vital signs in last 24 hours:  Temp:  [97.5 F (36.4 C)-98.1 F (36.7 C)] 97.8 F (36.6 C) (12/29 0729) Pulse Rate:  [62-68] 64 (12/29 0729) Resp:  [18] 18 (12/29 0454) BP: (119-155)/(49-71) 155/71 (12/29 0729) SpO2:  [92 %-96 %] 96 % (12/29 0729) Weight:  [92.8 kg] 92.8 kg (12/29 0454)  Weight change: -0.894 kg Filed Weights   07/12/18 1828 07/13/18 0500 07/14/18 0454  Weight: 92.8 kg 90.3 kg 92.8 kg    Intake/Output: No intake/output data recorded.   Intake/Output this shift:  No intake/output data recorded.  Physical Exam: General: No acute distress  Head: Normocephalic, atraumatic. Moist oral mucosal membranes  Eyes: Anicteric  Neck: Supple, trachea midline  Lungs:  Basilar rales, normal effort  Heart: S1S2 no rubs  Abdomen:  Soft, nontender, bowel sounds present  Extremities: trace peripheral edema.  Neurologic: Awake, alert, following commands  Skin: No lesions  Access: LUE AV access    Basic Metabolic Panel: Recent Labs  Lab 07/10/18 0454 07/11/18 0633 07/12/18 1605 07/13/18 0341 07/14/18 0408  NA 139 137 135 139 138  K 3.7 3.8 4.0 3.6 3.8  CL 97* 98 96* 99 98  CO2 28 24 24 29 29   GLUCOSE 159* 157* 211* 135* 176*  BUN 32* 56* 80* 41* 62*  CREATININE 5.19* 7.34* 9.37* 5.84* 7.31*  CALCIUM 8.2* 7.9* 7.4* 7.6* 7.3*    Liver Function Tests: Recent Labs  Lab 07/08/18 1227  AST 23  ALT 19  ALKPHOS 95  BILITOT 1.2  PROT 6.2*  ALBUMIN 3.4*   Recent Labs  Lab 07/08/18 1227  LIPASE 29   No results for input(s): AMMONIA in the last 168 hours.  CBC: Recent Labs  Lab 07/08/18 1227 07/09/18 0317 07/11/18 0633 07/14/18 0408  WBC 15.4* 15.7* 19.5* 27.1*  NEUTROABS 8.7*  --   --   --   HGB 8.3* 8.0* 8.2* 9.1*  HCT 26.7* 25.2* 25.7*  28.4*  MCV 95.4 92.3 90.8 88.8  PLT 148* 127* 178 213    Cardiac Enzymes: No results for input(s): CKTOTAL, CKMB, CKMBINDEX, TROPONINI in the last 168 hours.  BNP: Invalid input(s): POCBNP  CBG: Recent Labs  Lab 07/13/18 1645 07/13/18 2119 07/13/18 2144 07/14/18 0730 07/14/18 1135  GLUCAP 222* 300* 281* 131* 175*    Microbiology: Results for orders placed or performed during the hospital encounter of 07/08/18  CULTURE, BLOOD (ROUTINE X 2) w Reflex to ID Panel     Status: None   Collection Time: 07/08/18  8:49 PM  Result Value Ref Range Status   Specimen Description BLOOD BLOOD RIGHT HAND  Final   Special Requests   Final    BOTTLES DRAWN AEROBIC AND ANAEROBIC Blood Culture results may not be optimal due to an excessive volume of blood received in culture bottles   Culture   Final    NO GROWTH 5 DAYS Performed at [facility], [address] [place], [place] [address]    Report Status 07/13/2018 FINAL  Final  CULTURE, BLOOD (ROUTINE X 2) w Reflex to ID Panel     Status: None   Collection Time: 07/08/18  8:49 PM  Result Value Ref Range Status   Specimen Description BLOOD FINGER  Final   Special Requests   Final  BOTTLES DRAWN AEROBIC AND ANAEROBIC Blood Culture results may not be optimal due to an excessive volume of blood received in culture bottles   Culture   Final    NO GROWTH 5 DAYS Performed at [facility], [address] [place], [place] [address]    Report Status 07/13/2018 FINAL  Final  MRSA PCR Screening     Status: None   Collection Time: 07/09/18 12:33 AM  Result Value Ref Range Status   MRSA by PCR NEGATIVE NEGATIVE Final    Comment:        The GeneXpert MRSA Assay (FDA approved for NASAL specimens only), is one component of a comprehensive MRSA colonization surveillance program. It is not intended to diagnose MRSA infection nor to guide or monitor treatment for MRSA infections. Performed at [facility], [address] [place], [place] [address]     Coagulation Studies: No results for input(s): LABPROT, INR in the last 72 hours.  Urinalysis: No results for input(s): COLORURINE, LABSPEC, PHURINE, GLUCOSEU, HGBUR, BILIRUBINUR, KETONESUR, PROTEINUR, UROBILINOGEN, NITRITE, LEUKOCYTESUR in the last 72 hours.  Invalid input(s): APPERANCEUR    Imaging: No results found.   Medications:   . ampicillin-sulbactam (UNASYN) IV 3 g (07/13/18 1802)   . amLODipine  10 mg Oral Daily  . aspirin EC  81 mg Oral Daily  . carvedilol  12.5 mg Oral BID WC  . Chlorhexidine Gluconate Cloth  6 each Topical Q0600  . cinacalcet  30 mg Oral BH-q7a  . epoetin (EPOGEN/PROCRIT) injection  10,000 Units Intravenous Q M,W,F-HD  . ferric citrate  210 mg Oral TID WC  . heparin  5,000 Units Subcutaneous Q8H  . hydrALAZINE  50 mg Oral Q8H  . insulin aspart  0-5 Units Subcutaneous QHS  . insulin aspart  0-9 Units Subcutaneous TID WC  . [START ON 07/15/2018] methylPREDNISolone (SOLU-MEDROL) injection  40 mg Intravenous Daily  . multivitamin  1 tablet Oral Daily   acetaminophen **OR** acetaminophen, hydrALAZINE, labetalol, ondansetron **OR** ondansetron (ZOFRAN) IV, polyethylene glycol  Assessment/ Plan:  77 y.o. male with end stage renal disease on hemodialysis,  hypertension, diabetes mellitus, GERD, anemia of CKD, SHPTH, colon cancer status post resection on 10/31 by Dr. [name], CLL followed by Dr. Janese Banks, aortitis   [facility] MWF L AVF 92kg  1.  ESRD on HD on MWF.  Dialysis due tomorrow.  We will prepare orders.  2.  Anemia chronic kidney disease.  Maintain the patient on Epogen 10,000 units IV with dialysis.  3.  Secondary hyperparathyroidism.  Recheck serum phosphorus tomorrow and otherwise continue [place] and sensipar.    4.  Hypertension.  Maintain the patient on amlodipine, carvedilol, hydralazine.    5.  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   LOS: 6 Emer Onnen 12/29/20191:32 PM

## 2018-07-14 NOTE — Plan of Care (Signed)
?  Problem: Health Behavior/Discharge Planning: ?Goal: Ability to manage health-related needs will improve ?Outcome: Progressing ?  ?Problem: Clinical Measurements: ?Goal: Will remain free from infection ?Outcome: Progressing ?Goal: Respiratory complications will improve ?Outcome: Progressing ?  ?

## 2018-07-15 ENCOUNTER — Encounter
Admission: EM | Disposition: A | Discharge status: Home Health | Payer: Self-pay | Source: Home / Self Care | Attending: Internal Medicine

## 2018-07-15 DIAGNOSIS — Z85030 Personal history of malignant carcinoid tumor of large intestine: Secondary | ICD-10-CM

## 2018-07-15 DIAGNOSIS — I723 Aneurysm of iliac artery: Secondary | ICD-10-CM

## 2018-07-15 DIAGNOSIS — N186 End stage renal disease: Secondary | ICD-10-CM

## 2018-07-15 DIAGNOSIS — I776 Arteritis, unspecified: Secondary | ICD-10-CM

## 2018-07-15 HISTORY — PX: PERIPHERAL VASCULAR BALLOON ANGIOPLASTY: CATH118281

## 2018-07-15 HISTORY — PX: PELVIC ANGIOGRAPHY: CATH118254

## 2018-07-15 HISTORY — PX: CENTRAL VENOUS CATHETER INSERTION: SHX401

## 2018-07-15 LAB — CBC
HCT: 28.4 % — ABNORMAL LOW (ref 39.0–52.0)
Hemoglobin: 9.2 g/dL — ABNORMAL LOW (ref 13.0–17.0)
MCH: 28.9 pg (ref 26.0–34.0)
MCHC: 32.4 g/dL (ref 30.0–36.0)
MCV: 89.3 fL (ref 80.0–100.0)
Platelets: 215 10*3/uL (ref 150–400)
RBC: 3.18 MIL/uL — ABNORMAL LOW (ref 4.22–5.81)
RDW: 18.6 % — ABNORMAL HIGH (ref 11.5–15.5)
WBC: 29.2 10*3/uL — ABNORMAL HIGH (ref 4.0–10.5)
nRBC: 0.2 % (ref 0.0–0.2)

## 2018-07-15 LAB — GLUCOSE, CAPILLARY
Glucose-Capillary: 86 mg/dL (ref 70–99)
Glucose-Capillary: 90 mg/dL (ref 70–99)
Glucose-Capillary: 102 mg/dL — ABNORMAL HIGH (ref 70–99)
Glucose-Capillary: 108 mg/dL — ABNORMAL HIGH (ref 70–99)
Glucose-Capillary: 117 mg/dL — ABNORMAL HIGH (ref 70–99)
Glucose-Capillary: 137 mg/dL — ABNORMAL HIGH (ref 70–99)

## 2018-07-15 LAB — VANCOMYCIN, RANDOM: Vancomycin Rm: 13

## 2018-07-15 SURGERY — PERIPHERAL VASCULAR BALLOON ANGIOPLASTY
Anesthesia: Moderate Sedation | Laterality: Bilateral

## 2018-07-15 MED ORDER — VANCOMYCIN HCL 10 G IV SOLR
2000.0000 mg | Freq: Once | INTRAVENOUS | Status: DC
  Filled 2018-07-15: qty 2000

## 2018-07-15 MED ORDER — SODIUM CHLORIDE 0.9 % IV SOLN
INTRAVENOUS | Status: DC | PRN
  Administered 2018-07-15 – 2018-07-17 (×2): 500 mL via INTRAVENOUS

## 2018-07-15 MED ORDER — FENTANYL CITRATE (PF) 100 MCG/2ML IJ SOLN
INTRAMUSCULAR | Status: AC
  Filled 2018-07-15: qty 2

## 2018-07-15 MED ORDER — CEFAZOLIN SODIUM-DEXTROSE 1-4 GM/50ML-% IV SOLN
INTRAVENOUS | Status: AC
  Filled 2018-07-15: qty 50

## 2018-07-15 MED ORDER — PIPERACILLIN-TAZOBACTAM 3.375 G IVPB
3.3750 g | Freq: Two times a day (BID) | INTRAVENOUS | Status: DC
  Administered 2018-07-15 – 2018-07-18 (×7): 3.375 g via INTRAVENOUS
  Filled 2018-07-15 (×7): qty 50

## 2018-07-15 MED ORDER — VANCOMYCIN HCL IN DEXTROSE 1-5 GM/200ML-% IV SOLN
1000.0000 mg | Freq: Once | INTRAVENOUS | Status: DC
  Filled 2018-07-15: qty 200

## 2018-07-15 MED ORDER — MIDAZOLAM HCL 2 MG/2ML IJ SOLN
INTRAMUSCULAR | Status: DC | PRN
  Administered 2018-07-15: 1 mg via INTRAVENOUS
  Administered 2018-07-15: 2 mg via INTRAVENOUS
  Administered 2018-07-15 (×2): 1 mg via INTRAVENOUS

## 2018-07-15 MED ORDER — IOPAMIDOL (ISOVUE-300) INJECTION 61%
INTRAVENOUS | Status: DC | PRN
  Administered 2018-07-15: 70 mL via INTRA_ARTERIAL

## 2018-07-15 MED ORDER — LIDOCAINE-EPINEPHRINE (PF) 1 %-1:200000 IJ SOLN
INTRAMUSCULAR | Status: AC
  Filled 2018-07-15: qty 10

## 2018-07-15 MED ORDER — CEFAZOLIN SODIUM-DEXTROSE 1-4 GM/50ML-% IV SOLN
1.0000 g | Freq: Once | INTRAVENOUS | Status: AC
  Administered 2018-07-15: 1 g via INTRAVENOUS

## 2018-07-15 MED ORDER — HEPARIN SODIUM (PORCINE) 1000 UNIT/ML IJ SOLN
INTRAMUSCULAR | Status: DC | PRN
  Administered 2018-07-15: 5000 [IU] via INTRAVENOUS

## 2018-07-15 MED ORDER — HEPARIN (PORCINE) IN NACL 1000-0.9 UT/500ML-% IV SOLN
INTRAVENOUS | Status: AC
  Filled 2018-07-15: qty 1000

## 2018-07-15 MED ORDER — FENTANYL CITRATE (PF) 100 MCG/2ML IJ SOLN
INTRAMUSCULAR | Status: DC | PRN
  Administered 2018-07-15: 25 ug via INTRAVENOUS
  Administered 2018-07-15: 50 ug via INTRAVENOUS
  Administered 2018-07-15 (×2): 25 ug via INTRAVENOUS

## 2018-07-15 MED ORDER — MIDAZOLAM HCL 5 MG/5ML IJ SOLN
INTRAMUSCULAR | Status: AC
  Filled 2018-07-15: qty 5

## 2018-07-15 MED ORDER — VANCOMYCIN HCL IN DEXTROSE 1-5 GM/200ML-% IV SOLN
1000.0000 mg | INTRAVENOUS | Status: DC
  Administered 2018-07-15 – 2018-07-17 (×2): 1000 mg via INTRAVENOUS
  Filled 2018-07-15 (×4): qty 200

## 2018-07-15 MED ORDER — HEPARIN SODIUM (PORCINE) 1000 UNIT/ML IJ SOLN
INTRAMUSCULAR | Status: AC
  Filled 2018-07-15: qty 1

## 2018-07-15 SURGICAL SUPPLY — 37 items
BALLN ULTRVRSE 10X40X75 (BALLOONS) ×3
BALLN ULTRVRSE 12X40X75 (BALLOONS) ×3
BALLOON ULTRVRSE 10X40X75 (BALLOONS) ×2 IMPLANT
BALLOON ULTRVRSE 12X40X75 (BALLOONS) ×2 IMPLANT
CATH BEACON 5 .035 65 KMP TIP (CATHETERS) ×3 IMPLANT
CATH BEACON 5 .035 65 RIM TIP (CATHETERS) ×3 IMPLANT
CATH MICROCATH PRGRT 2.8F 110 (CATHETERS) ×2 IMPLANT
CATH PIG 70CM (CATHETERS) ×3 IMPLANT
COIL 400 COMPLEX SOFT 10X35CM (Vascular Products) ×3 IMPLANT
COIL 400 COMPLEX SOFT 8X60CM (Vascular Products) ×6 IMPLANT
COIL 400 COMPLEX STD 8X25CM (Vascular Products) ×3 IMPLANT
DERMABOND ADVANCED (GAUZE/BANDAGES/DRESSINGS) ×1
DERMABOND ADVANCED .7 DNX12 (GAUZE/BANDAGES/DRESSINGS) ×2 IMPLANT
DEVICE OCCLUSION POD8 (Vascular Products) ×2 IMPLANT
DEVICE OCCLUSION PODJ45 (Vascular Products) ×2 IMPLANT
DEVICE PRESTO INFLATION (MISCELLANEOUS) ×9 IMPLANT
DEVICE STARCLOSE SE CLOSURE (Vascular Products) ×6 IMPLANT
DEVICE TORQUE .025-.038 (MISCELLANEOUS) ×3 IMPLANT
GLIDEWIRE STIFF .35X180X3 HYDR (WIRE) ×3 IMPLANT
HANDLE DETACHMENT COIL (MISCELLANEOUS) ×3 IMPLANT
KIT SINGLE LUMEN POWERLINE 5FR (CATHETERS) ×3 IMPLANT
MICROCATH PROGREAT 2.8F 110 CM (CATHETERS) ×3
NEEDLE ENTRY 21GA 7CM ECHOTIP (NEEDLE) ×3 IMPLANT
OCCLUSION DEVICE POD8 (Vascular Products) ×3 IMPLANT
OCCLUSION DEVICE PODJ45 (Vascular Products) ×3 IMPLANT
PACK ANGIOGRAPHY (CUSTOM PROCEDURE TRAY) ×3 IMPLANT
SET INTRO CAPELLA COAXIAL (SET/KITS/TRAYS/PACK) ×3 IMPLANT
SHEATH BRITE TIP 5FRX11 (SHEATH) ×6 IMPLANT
SHEATH BRITE TIP 8FRX11 (SHEATH) ×6 IMPLANT
STENT LIFESTREAM 10X58X80 (Permanent Stent) ×6 IMPLANT
STENT LIFESTREAM 12X58X80 (Permanent Stent) ×6 IMPLANT
SUT MNCRL AB 4-0 PS2 18 (SUTURE) ×3 IMPLANT
SUT VIC AB 3-0 SH 27 (SUTURE) ×1
SUT VIC AB 3-0 SH 27X BRD (SUTURE) ×2 IMPLANT
TUBING CONTRAST HIGH PRESS 72 (TUBING) ×3 IMPLANT
WIRE J 3MM .035X145CM (WIRE) ×3 IMPLANT
WIRE MAGIC TOR.035 180C (WIRE) ×6 IMPLANT

## 2018-07-15 NOTE — Progress Notes (Signed)
[facility]  ROUNDING NOTE   Subjective:   Aortic stent placement for today. Hemodialysis treatment for today.   Patient has no complaints this morning.    Objective:  Vital signs in last 24 hours:  Temp:  [97.4 F (36.3 C)-98.2 F (36.8 C)] 97.4 F (36.3 C) (12/30 0726) Pulse Rate:  [61-65] 61 (12/30 0937) Resp:  [18-19] 18 (12/30 0937) BP: (122-156)/(56-71) 122/56 (12/30 0937) SpO2:  [92 %-96 %] 92 % (12/30 0937) Weight:  [92.3 kg] 92.3 kg (12/30 0346)  Weight change: -0.466 kg Filed Weights   07/13/18 0500 07/14/18 0454 07/15/18 0346  Weight: 90.3 kg 92.8 kg 92.3 kg    Intake/Output: No intake/output data recorded.   Intake/Output this shift:  No intake/output data recorded.  Physical Exam: General: No acute distress  Head: Normocephalic, atraumatic. Moist oral mucosal membranes  Eyes: Anicteric  Neck: Supple, trachea midline  Lungs:  clear  Heart: S1S2 no rubs  Abdomen:  Soft, nontender, bowel sounds present  Extremities: No peripheral edema.  Neurologic: Awake, alert, following commands  Skin: No lesions  Access: LUE AVF    Basic Metabolic Panel: Recent Labs  Lab 07/10/18 0454 07/11/18 0633 07/12/18 1605 07/13/18 0341 07/14/18 0408  NA 139 137 135 139 138  K 3.7 3.8 4.0 3.6 3.8  CL 97* 98 96* 99 98  CO2 28 24 24 29 29   GLUCOSE 159* 157* 211* 135* 176*  BUN 32* 56* 80* 41* 62*  CREATININE 5.19* 7.34* 9.37* 5.84* 7.31*  CALCIUM 8.2* 7.9* 7.4* 7.6* 7.3*    Liver Function Tests: Recent Labs  Lab 07/08/18 1227  AST 23  ALT 19  ALKPHOS 95  BILITOT 1.2  PROT 6.2*  ALBUMIN 3.4*   Recent Labs  Lab 07/08/18 1227  LIPASE 29   No results for input(s): AMMONIA in the last 168 hours.  CBC: Recent Labs  Lab 07/08/18 1227 07/09/18 0317 07/11/18 0633 07/14/18 0408 07/15/18 0331  WBC 15.4* 15.7* 19.5* 27.1* 29.2*  NEUTROABS 8.7*  --   --   --   --   HGB 8.3* 8.0* 8.2* 9.1* 9.2*  HCT 26.7* 25.2* 25.7* 28.4* 28.4*  MCV  95.4 92.3 90.8 88.8 89.3  PLT 148* 127* 178 213 215    Cardiac Enzymes: No results for input(s): CKTOTAL, CKMB, CKMBINDEX, TROPONINI in the last 168 hours.  BNP: Invalid input(s): POCBNP  CBG: Recent Labs  Lab 07/14/18 0730 07/14/18 1135 07/14/18 1620 07/14/18 2059 07/15/18 0727  GLUCAP 131* 175* 188* 184* 68    Microbiology: Results for orders placed or performed during the hospital encounter of 07/08/18  CULTURE, BLOOD (ROUTINE X 2) w Reflex to ID Panel     Status: None   Collection Time: 07/08/18  8:49 PM  Result Value Ref Range Status   Specimen Description BLOOD BLOOD RIGHT HAND  Final   Special Requests   Final    BOTTLES DRAWN AEROBIC AND ANAEROBIC Blood Culture results may not be optimal due to an excessive volume of blood received in culture bottles   Culture   Final    NO GROWTH 5 DAYS Performed at [facility], [address] [place], [place] [address]    Report Status 07/13/2018 FINAL  Final  CULTURE, BLOOD (ROUTINE X 2) w Reflex to ID Panel     Status: None   Collection Time: 07/08/18  8:49 PM  Result Value Ref Range Status   Specimen Description BLOOD FINGER  Final   Special Requests  Final    BOTTLES DRAWN AEROBIC AND ANAEROBIC Blood Culture results may not be optimal due to an excessive volume of blood received in culture bottles   Culture   Final    NO GROWTH 5 DAYS Performed at [facility], [address] [place], [place] [address]    Report Status 07/13/2018 FINAL  Final  MRSA PCR Screening     Status: None   Collection Time: 07/09/18 12:33 AM  Result Value Ref Range Status   MRSA by PCR NEGATIVE NEGATIVE Final    Comment:        The GeneXpert MRSA Assay (FDA approved for NASAL specimens only), is one component of a comprehensive MRSA colonization surveillance program. It is not intended to diagnose MRSA infection nor to guide or monitor treatment for MRSA infections. Performed at [facility], [address] [place], [place] [address]     Coagulation Studies: No results for input(s): LABPROT, INR in the last 72 hours.  Urinalysis: No results for input(s): COLORURINE, LABSPEC, PHURINE, GLUCOSEU, HGBUR, BILIRUBINUR, KETONESUR, PROTEINUR, UROBILINOGEN, NITRITE, LEUKOCYTESUR in the last 72 hours.  Invalid input(s): APPERANCEUR    Imaging: No results found.   Medications:   . ampicillin-sulbactam (UNASYN) IV 3 g (07/14/18 1731)   . amLODipine  10 mg Oral Daily  . aspirin EC  81 mg Oral Daily  . carvedilol  12.5 mg Oral BID WC  . Chlorhexidine Gluconate Cloth  6 each Topical Q0600  . cinacalcet  30 mg Oral BH-q7a  . epoetin (EPOGEN/PROCRIT) injection  10,000 Units Intravenous Q M,W,F-HD  . ferric citrate  210 mg Oral TID WC  . heparin  5,000 Units Subcutaneous Q8H  . hydrALAZINE  50 mg Oral Q8H  . insulin aspart  0-5 Units Subcutaneous QHS  . insulin aspart  0-9 Units Subcutaneous TID WC  . methylPREDNISolone (SOLU-MEDROL) injection  40 mg Intravenous Daily  . multivitamin  1 tablet Oral Daily   acetaminophen **OR** acetaminophen, hydrALAZINE, labetalol, ondansetron **OR** ondansetron (ZOFRAN) IV, polyethylene glycol  Assessment/ Plan:  77 y.o. black male with end stage renal disease on hemodialysis,  hypertension, diabetes mellitus, GERD, anemia, SHPTH, colon cancer status post resection on 10/31 by Dr. [name], CLL followed by Dr. Janese Banks, aortitis   [facility] MWF L AVF 92kg  1.  ESRD on HD on MWF.  Dialysis for today after vascular procedure.   2.  Anemia chronic kidney disease.  -  Epogen 10,000 units IV with dialysis.  3.  Secondary hyperparathyroidism. - Auryxia with meals - Cinacalcet daily  4.  Hypertension. At goal.  - amlodipine, carvedilol, hydralazine.    5.  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 - Unasyn.    LOS: 7 Sovereign Ramiro 12/30/20199:45 AM

## 2018-07-15 NOTE — Progress Notes (Signed)
HD tx start    07/15/18 1513  Vital Signs  Pulse Rate (!) 59  Pulse Rate Source Monitor  Resp (!) 9  BP (!) 141/58  BP Location Right Arm  BP Method Automatic  Patient Position (if appropriate) Lying  Oxygen Therapy  SpO2 95 %  O2 Device Room Air  During Hemodialysis Assessment  Blood Flow Rate (mL/min) 400 mL/min  Arterial Pressure (mmHg) -140 mmHg  Venous Pressure (mmHg) 180 mmHg  Transmembrane Pressure (mmHg) 60 mmHg  Ultrafiltration Rate (mL/min) 70 mL/min  Dialysate Flow Rate (mL/min) 800 ml/min  Conductivity: Machine  14.3  HD Safety Checks Performed Yes  Dialysis Fluid Bolus Normal Saline  Bolus Amount (mL) 250 mL  Intra-Hemodialysis Comments Tx initiated  Fistula / Graft Left Upper arm Arteriovenous fistula  No Placement Date or Time found.   Placed prior to admission: Yes  Orientation: Left  Access Location: Upper arm  Access Type: Arteriovenous fistula  Status Accessed  Needle Size 15

## 2018-07-15 NOTE — Op Note (Addendum)
[facility]  Percutaneous Study/Intervention Procedural Note   Date of Surgery: 07/15/2018  Surgeon(s): Dr. Leotis Pain, MD  Cosurgeon: Dr. 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  Ultrasound guidance for vascular access bilateral femoral arteries, right by Dr. Lucky Cowboy left by Dr. Delana Meyer             2.  Catheter placement into left internal iliac artery branches from right femoral approach and catheter placement into the aorta from left femoral approach             3.  Aortogram and selective left pelvic angiogram             4.   Coil embolization of the left internal iliac artery and its primary branches with multiple 8 and 10 mm Ruby coils from right femoral approach             5.   Covered stent placement to the distal aorta and proximal bilateral common iliac arteries with 10 mm diameter by 58 mm length lifestream stents right by Dr. Lucky Cowboy, left by Dr. Delana Meyer  6.  Covered stent placement to the right common iliac artery down to the iliac bifurcation with 12 mm diameter by 58 mm length lifestream stent by Dr. Lucky Cowboy  7.  Covered stent placement to the left common and proximal external iliac artery with 12 mm diameter by 58 mm length lifestream stent by Dr. Delana Meyer             8.  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  He has developed pseudoaneurysms of the left common and internal iliac artery and aortic bifurcation and if left untreated these are almost  certain to have lethal rupture.  Although covered stents would protect him from rupture, they will have a very high infectious risk.  The patient was felt to be a prohibitive risk from open surgical therapy so this will be our only option along with long-term antibiotics.  Coil embolization of the left internal iliac artery is required to treat the disease process in this location as it would not be amenable to covered stent therapy and would not be protected if we only cover the common and external iliac arteries.  Risks and benefits are discussed and informed consent is obtained.   Procedure:  The patient was identified and appropriate procedural time out was performed.  The patient was then placed supine on the table and prepped and draped in the usual sterile fashion. Moderate conscious sedation was administered during a face to face encounter with the patient throughout the procedure with my supervision of the RN administering medicines and monitoring the patient's vital signs, pulse oximetry, telemetry and mental status throughout from the start of the procedure until the patient was taken to the recovery room.  Due to the complex nature of the disease process and the bilateral nature of the disease, we  worked as co-surgeons with Dr. Delana Meyer working on the left and myself working on the right.  Ultrasound was used to evaluate the right common femoral artery.  It was patent .  A digital ultrasound image was acquired.  A Seldinger needle was used to access the right common femoral artery under direct ultrasound guidance and a permanent image was performed.  A 0.035 J wire was advanced without resistance and a 5Fr sheath was placed.  Pigtail catheter was placed into the aorta and an AP aortogram was performed. This demonstrated inflammation of the terminal aorta and the proximal iliac arteries worse on the left with pseudoaneurysms on the left.  These pseudoaneurysms track down to the iliac bifurcation  including the internal iliac artery and the proximal portion of the external iliac artery on the left. The right sided disease was limited to the proximal portion in the common iliac artery.  The external iliac artery on the right seem to normalize and the left external iliac artery appeared to be reasonable after the most proximal portion.  The femoral arteries were normal.  There was no focal renal artery stenosis although the left renal flow was somewhat sluggish.  The patient was systemically heparinized.  I then crossed the aortic bifurcation with a rim catheter and a J-wire and advanced down into the left internal iliac artery from the right femoral approach.  Selective imaging was performed.  I then advanced a pro-grate microcatheter beyond the primary bifurcation of the left internal iliac artery and began deploying coils to embolize.  The main 2 branches were packed with a pod 8, 60 cm Ruby coil and then we began packing coils up to the proximal left internal iliac artery.  This would also embolize the small initial superior branch of the left internal iliac artery.  A total of 7 coils were used with one 10 mm and five 8 mm coils as well as 1 packing coil.  The pro-grate microcatheter was removed and successful embolization was confirmed with selective imaging of the left internal iliac artery with a rim catheter.  Access was then gained to the left common femoral artery with ultrasound guidance by Dr. Delana Meyer and a permanent image was recorded.  Initially, 5 French sheath was placed on the left and we then upsized to an 8 [place] sheath on the left.  The right femoral sheath was also upsized to an 8 sheath.  Magic torque wires were placed into the aorta bilaterally.  We then selected two 10 mm diameter by 58 mm length lifestream covered stents.  These were taken up about 3 to 4 cm into the aorta and then pulled back into the proximal common iliac arteries bilaterally.  These were inflated to 10 atm  simultaneously with Dr. Delana Meyer performing the deployment on the left and me performing the deployment on the right.  On the right side, a 12 mm diameter by 58 mm length covered stent was then taken down to the iliac bifurcation and inflated to 8 atm.  On the left side, Dr. Delana Meyer took a 12 mm diameter by 58 mm length covered stent down across the iliac bifurcation into the proximal left external iliac artery with good overlap between stents and inflated this to 8 atm for 1 minute.  A 12 mm balloon on the left and a 10 mm balloon on the right were then inflated into the distal aorta to ensure a good seal in the aortic wall which measured about 18 to 19 mm.  Completion imaging through both femoral sheaths as well as the aorta was then performed.  No extravasation or endoleak was identified with a successful coil embolization of the left hypogastric artery and successful exclusion of the aortitis and pseudoaneurysms now being resolved.  There was good flow through the stents with no residual stenosis on either side. I elected to terminate the procedure. The sheath was removed and StarClose closure device was deployed in the left femoral artery with excellent hemostatic result.  Similarly, Star close closure device was deployed on the right femoral artery with excellent hemostatic result.  The patient was taken to the recovery room in stable condition having tolerated the procedure well.  Findings:               Aortogram:  Inflammation of the terminal aorta and the proximal iliac arteries worse on the left with pseudoaneurysms on the left involving the common and internal iliac arteries.  These track down to the iliac bifurcation on the left.  The external iliac arteries seem to normalize bilaterally after the most proximal portion on the left and the femoral arteries were normal.  There was no focal renal artery stenosis although the left renal flow was somewhat sluggish.                Disposition: Patient  was taken to the recovery room in stable condition having tolerated the procedure well.  Complications: None  Leotis Pain 07/15/2018 1:20 PM   This note was created with Dragon Medical transcription system. Any errors in dictation are purely unintentional.

## 2018-07-15 NOTE — Progress Notes (Signed)
Vanco level drawn and sent to lab via runner. Dr. Lucky Cowboy at bedside, speaking with pt. And his daughter re: preprocedural explanation. Both verbalize understanding.

## 2018-07-15 NOTE — Progress Notes (Signed)
Dr. Lucky Cowboy at bedside, speaking with pt. Re: procedural results. Called pt. Daughter to inform pt. Stable and out of procedure. Daughter verbalized understanding.

## 2018-07-15 NOTE — Op Note (Signed)
OPERATIVE NOTE    PRE-OPERATIVE DIAGNOSIS: 1. ESRD 2. Clostridium infection with aortitis  POST-OPERATIVE DIAGNOSIS: same as above  PROCEDURE: 1. Ultrasound guidance for vascular access to the right internal jugular vein 2. Fluoroscopic guidance for placement of catheter 3. Placement of a tunneled Hickman catheter for long-term intravenous access into the right jugular vein  SURGEON: Leotis Pain, MD  ANESTHESIA:  Local with Moderate conscious sedation for approximately 75 minutes using 5 mg of Versed and 125 mcg of Fentanyl (for both procedures)  ESTIMATED BLOOD LOSS: 5 cc  FLUORO TIME: less than one minute  CONTRAST: none  FINDING(S): 1.  Patent right internal jugular vein  SPECIMEN(S):  None  INDICATIONS:   Jonathan Mcgrath is a 77 y.o.male who presents with Clostridium infection and aortitis.  We are going to place covered stents to exclude the pseudoaneurysms and avoid lethal rupture, but he will need long-term antibiotics and a tunnel catheter will be placed for this reason for long-term venous access.  Risks and benefits are discussed and informed consent is obtained.    DESCRIPTION: After obtaining full informed written consent, the patient was brought back to the vascular suited. The patient's right neck and chest were sterilely prepped and draped in a sterile surgical field was created. Moderate conscious sedation was administered during a face to face encounter with the patient throughout the procedure with my supervision of the RN administering medicines and monitoring the patient's vital signs, pulse oximetry, telemetry and mental status throughout from the start of the procedure until the patient was taken to the recovery room.  The right internal jugular vein was visualized with ultrasound and found to be patent. It was then accessed under direct ultrasound guidance and a permanent image was recorded. A wire was placed. After skin nick and dilatation, the peel-away  sheath was placed over the wire. I then turned my attention to an area under the clavicle. Approximately 1-2 fingerbreadths below the clavicle a small counterincision was created and tunneled from the subclavicular incision to the access site. Using fluoroscopic guidance, a tunneled Hickman catheter was selected, and tunneled from the subclavicular incision to the access site. It was then placed through the peel-away sheath and the peel-away sheath was removed.  This was done after using fluoroscopic guidance to cut the catheter to an appropriate length.  The catheter tip was parked at the cavoatrial junction.  It withdrew blood well and flushed easily with heparinized saline. It was secured to the chest wall with 2 Prolene sutures. The access incision was closed single 4-0 Monocryl. A 4-0 Monocryl pursestring suture was placed around the exit site. Sterile dressings were placed. The patient tolerated the procedure well and was taken to the recovery room in stable condition.  COMPLICATIONS: None  CONDITION: Stable  Leotis Pain, MD 07/15/2018 12:37 PM   This note was created with Dragon Medical transcription system. Any errors in dictation are purely unintentional.

## 2018-07-15 NOTE — Progress Notes (Signed)
HD tx end    07/15/18 1849  Vital Signs  Pulse Rate 64  Pulse Rate Source Monitor  Resp 20  BP (!) 137/54  BP Location Right Arm  BP Method Automatic  Patient Position (if appropriate) Lying  Oxygen Therapy  SpO2 96 %  O2 Device Room Air  During Hemodialysis Assessment  Dialysis Fluid Bolus Normal Saline  Bolus Amount (mL) 250 mL  Intra-Hemodialysis Comments Tx completed

## 2018-07-15 NOTE — Op Note (Signed)
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  Abdominal aortogram with pelvic angiography  2.  Coil embolization of the left internal iliac artery 3.  Percutaneous transluminal angioplasty and stent placement right common iliac artery; "kissing balloon" technique  4.  Percutaneous transluminal and plasty and stent placement left common iliac artery; "kissing balloon" technique  5.  Percutaneous transluminal angioplasty and stent placement left external iliac artery  6. Ultrasound guided access bilateral common femoral arteries              7.StarClose closure device bilateral common femoral arteries  Anesthesia: Conscious sedation was administered under my direct supervision by the interventional radiology RN. IV Versed plus fentanyl were utilized. Continuous ECG, pulse oximetry and blood pressure was monitored throughout the entire procedure. Conscious sedation was for a total of 70 minutes.  Sheath: 8 French 11 cm Pinnacle sheaths bilateral common femoral arteries retrograde  Contrast: 70 cc  Fluoroscopy Time: 8.1 minutes  Indications: Patient presented with signs and symptoms consistent with sepsis.  He has recently been treated for Clostridium sepsis and was at high suspicion for recurrence.  Work-up included a CT scan which demonstrated changes of the distal aorta and common iliac arteries consistent with aortic and iliac infection.  Also noted in the left common iliac artery to pseudoaneurysms with contained hemorrhage.  Procedure:  SILVIA MARKUSON a 77 y.o. male who was identified and appropriate procedural time out was performed.  The  patient was then placed supine on the table and prepped and draped in the usual sterile fashion.   Because of the simultaneous nature of the procedure Dr. Lucky Cowboy and I are working together.  Dr. Lucky Cowboy performed his portion from the patient's right side and I worked from the patient's left side.   Ultrasound was used to evaluate the bilateral common femoral arteries.  It was echolucent and pulsatile indicating it is patent .  An ultrasound image was acquired for each access site for the permanent record.  A Seldinger needle was used to access the bilateral common femoral arteries under direct ultrasound guidance.  A 0.035 J wire was advanced without resistance and a 5Fr sheath was placed.    The pigtail catheter was then positioned at the level of T12 and an AP image of the aorta was obtained. After review the images the pigtail catheter was repositioned above the aortic bifurcation and bilateral oblique views of the pelvis were obtained.   Interpretation: The proximal aorta appears normal in caliber there is diffuse atherosclerotic changes but no hemodynamically significant disease.  In the distal aorta and extending into both common iliacs there is a haziness associated with the arteries.  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  Within the left common iliac artery there are 2 areas of extravasation consistent with pseudoaneurysms.  Origins of the internal iliac arteries are noted bilaterally and are patent.  External iliac and visualized portions of the femoral arteries are all widely patent.  After review of these images the decision was made to continue with intervention.  4000 Units of heparin was given and allowed to circulate for proximally 4 minutes.  At this  point Dr. Lucky Cowboy selected the left internal iliac artery.  After securing access to the internal iliac artery the origin and proximal 1 to 2 cm of the left internal iliac artery  were occluded using coil embolization with Ruby coils.  Please refer to Dr. Bunnie Domino note regarding the size and length of the individual coils.  Follow-up imaging now demonstrated an excellent result with successful exclusion of the antegrade flow into the internal iliac artery.  Both of the 5 [place] sheaths were then upsized to a 8 [place] sheath as well after and a 0.035 Magic torque wire was advanced up both sides. Magnified images of the aortic bifurcation were then made using hand injection contrast from the femoral sheaths. After appropriate sizing a 10 x 58 lifestream stent was selected for the right and a 10 x 58 lifestream stent was selected for the left. There were then advanced and positioned just above the aortic bifurcation. Insufflation for full expansion of the stents was performed simultaneously with Dr. dew angioplasty from the right covering the distal aorta and the proximal portion of the right common iliac and myself angioplasty and from the left covering the distal aorta and the proximal portion of the left common iliac.  Following this 1258 lifestream stent was used to extend the angioplasty of the right common iliac and a 12 x 58 lifestream stent was then used to extend the angioplasty on the left extending this stent into the external iliac by approximately 2 cm completely excluding both of the pseudoaneurysms as well as the origin of the internal iliac.  Finally, a 12 mm x 40 mm ultra versed balloon was advanced up the left side and a 10 mm x 40 mm ultra versed balloon was advanced up the right side in the distal aorta was angioplastied one more time inflation was to 10 atm for approximately 1 minute.  Follow-up imaging was then performed and demonstrated complete exclusion of the affected areas of the aorta and elimination of the pseudoaneurysms.  Oblique views were then obtained of the groins in succession and Star close device is deployed without difficulty. There were no immediate  complications   Findings:   Aortogram:   The proximal aorta appears normal in caliber there is diffuse atherosclerotic changes but no hemodynamically significant disease.  In the distal aorta and extending into both common iliacs there is a haziness associated with the arteries.  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  Within the left common iliac artery there are 2 areas of extravasation consistent with pseudoaneurysms.  Origins of the internal iliac arteries are noted bilaterally and are patent.  External iliac and visualized portions of the femoral arteries are all widely patent.  Following placement of the iliac stents there is now wide patency with less than 5% % residual stenosis with rapid flow through the aortic bifurcation bilaterally.  The image also showscomplete exclusion of the affected areas of the aorta and elimination of the pseudoaneurysms with successful seal in both the aorta as well as the right common iliac and the left external iliac   Summary:  Successful reconstruction of the distal aorta and bilateral iliac arteries for exclusion of infected aorta and iliac arteries associated with pseudoaneurysms of the left common iliac artery.  Disposition: Patient was taken to the recovery room in stable condition having tolerated the procedure well.  Belenda Cruise Fatuma Dowers 07/15/2018,12:34 PM

## 2018-07-15 NOTE — Progress Notes (Signed)
Pharmacy Antibiotic Note  Jonathan Mcgrath is a 77 y.o. male admitted on 07/08/2018 with aortitis.  Pharmacy has been consulted for vancomycin dosing.  Plan: This patient has received vancomycin this admission. He received 2 gm IV x 1 07/08/18 at 21:12, had a dialysis session on 07/09/18 (early due to [employer] holiday), got another 1 gm IV x 1 dose 12/24 at 18:43, and had his regularly scheduled dialysis on 07/12/18. Due to these administrations and only one HD session since the last dose we will check a random vancomycin level before giving any more doses. If vancomycin random is less than 10 mcg/ml we will plan to re-load with 2 gm IV x 1. If vancomycin random is 10 to 20 mcg/ml we will give half load of 1 gm IV x 1. If vancomycin level is greater than 20 we will not re-load. Maintenance doses of vancomycin will be 1000 mg IV Q-dialysis MWF and will start today after HD.    Height: 6\' 1"  (185.4 cm) Weight: 203 lb 9.2 oz (92.3 kg) IBW/kg (Calculated) : 79.9  Temp (24hrs), Avg:97.7 F (36.5 C), Min:97.4 F (36.3 C), Max:98.2 F (36.8 C)  Recent Labs  Lab 07/08/18 1227 07/09/18 0317 07/10/18 0454 07/11/18 0633 07/12/18 1605 07/13/18 0341 07/14/18 0408 07/15/18 0331  WBC 15.4* 15.7*  --  19.5*  --   --  27.1* 29.2*  CREATININE 5.41* 6.39* 5.19* 7.34* 9.37* 5.84* 7.31*  --     Estimated Creatinine Clearance: 9.6 mL/min (A) (by C-G formula based on SCr of 7.31 mg/dL (H)).    No Known Allergies  Antimicrobials this admission:   Dose adjustments this admission:   Microbiology results:  BCx:   UCx:    Sputum:    MRSA PCR:   Thank you for allowing pharmacy to be a part of this patient's care.  Laural Benes, PharmD, BCPS Clinical Pharmacist 07/15/2018 10:24 AM

## 2018-07-15 NOTE — Progress Notes (Signed)
Pre HD assessment   07/15/18 1500  Vital Signs  Temp (!) 97.5 F (36.4 C)  Temp Source Oral  Pulse Rate (!) 59  Pulse Rate Source Monitor  Resp 10  BP (!) 140/57  BP Location Right Arm  BP Method Automatic  Patient Position (if appropriate) Lying  Oxygen Therapy  SpO2 95 %  O2 Device Room Air  Pain Assessment  Pain Scale 0-10  Pain Score 0  Dialysis Weight  Weight 96 kg  Type of Weight Pre-Dialysis  Time-Out for Hemodialysis  What Procedure? HD  Pt Identifiers(min of two) First/Last Name;MRN/Account#  Correct Site? Yes  Correct Side? Yes  Correct Procedure? Yes  Consents Verified? Yes  Rad Studies Available? N/A  Safety Precautions Reviewed? Yes  Engineer, civil (consulting) Number  ([id number])  Station Number 4  UF/Alarm Test Passed  Conductivity: Meter 14  Conductivity: Machine  14.1  pH 7.6  Reverse Osmosis main  Normal Saline Lot Number [id number]  Dialyzer Lot Number [id number]  Disposable Set Lot Number [id number]  Machine Temperature 98.6 F (37 C)  Musician and Audible Yes  Blood Lines Intact and Secured Yes  Pre Treatment Patient Checks  Vascular access used during treatment Fistula  Hepatitis B Surface Antigen Results Negative  Date Hepatitis B Surface Antigen Drawn 09/05/17  Hepatitis B Surface Antibody 366 (366>10)  Date Hepatitis B Surface Antibody Drawn 09/05/17  Hemodialysis Consent Verified Yes  Hemodialysis Standing Orders Initiated Yes  ECG (Telemetry) Monitor On Yes  Prime Ordered Normal Saline  Length of  DialysisTreatment -hour(s) 3.5 Hour(s)  Dialyzer Elisio 17H NR  Dialysate 3K, 2.5 Ca (per MD orders)  Dialysis Anticoagulant None  Dialysate Flow Ordered 800  Blood Flow Rate Ordered 400 mL/min  Ultrafiltration Goal 1.5 Liters  Pre Treatment Labs Phosphorus  Dialysis Blood Pressure Support Ordered Normal Saline  Education / Care Plan  Dialysis Education Provided Yes  Documented Education in Care Plan Yes  Fistula / Graft Left Upper arm  Arteriovenous fistula  No Placement Date or Time found.   Placed prior to admission: Yes  Orientation: Left  Access Location: Upper arm  Access Type: Arteriovenous fistula  Site Condition No complications  Fistula / Graft Assessment Present;Thrill;Bruit  Drainage Description None

## 2018-07-15 NOTE — Progress Notes (Signed)
Pharmacy Antibiotic Note  Jonathan Mcgrath is a 77 y.o. male admitted on 07/08/2018 with aortitis.  Pharmacy has been consulted for Zosyn dosing.  Plan: Zosyn 3.375 gm IV Q12H EI (dialysis dosing)  Height: 6\' 1"  (185.4 cm) Weight: 203 lb 9.2 oz (92.3 kg) IBW/kg (Calculated) : 79.9  Temp (24hrs), Avg:97.7 F (36.5 C), Min:97.4 F (36.3 C), Max:98.2 F (36.8 C)  Recent Labs  Lab 07/09/18 0317 07/10/18 0454 07/11/18 0633 07/12/18 1605 07/13/18 0341 07/14/18 0408 07/15/18 0331 07/15/18 1031  WBC 15.7*  --  19.5*  --   --  27.1* 29.2*  --   CREATININE 6.39* 5.19* 7.34* 9.37* 5.84* 7.31*  --   --   VANCORANDOM  --   --   --   --   --   --   --  13    Estimated Creatinine Clearance: 9.6 mL/min (A) (by C-G formula based on SCr of 7.31 mg/dL (H)).    No Known Allergies  Antimicrobials this admission: 12/30 to present: vancomycin per HD  Dose adjustments this admission:   Microbiology results:  BCx:   UCx:    Sputum:    MRSA PCR:   Thank you for allowing pharmacy to be a part of this patient's care.  Laural Benes, PharmD, BCPS Clinical Pharmacist 07/15/2018 3:07 PM

## 2018-07-15 NOTE — Progress Notes (Signed)
Post HD assessment    07/15/18 1852  Neurological  Level of Consciousness Alert  Orientation Level Oriented X4  Respiratory  Respiratory Pattern Regular;Unlabored  Chest Assessment Chest expansion symmetrical  Cardiac  Pulse Irregular  ECG Monitor Yes  Cardiac Rhythm SB;NSR  Vascular  R Radial Pulse +2  L Radial Pulse +2  Edema Generalized;Facial  Integumentary  Integumentary (WDL) X  Skin Color Appropriate for ethnicity  Musculoskeletal  Musculoskeletal (WDL) X  Generalized Weakness Yes  Assistive Device None  GU Assessment  Genitourinary (WDL) X  Genitourinary Symptoms  (HD)  Psychosocial  Psychosocial (WDL) X  Patient Behaviors Sad;Cooperative;Calm;Other (Comment) (wants to give up )  Emotional support given Given to patient

## 2018-07-15 NOTE — Progress Notes (Signed)
RN unavailable to give report, in pts room with Dr. Lanny Cramp RN to call this nurse back in 5 minutes to give report prior to HD tx.    07/15/18 1420  Hand-Off documentation  Report given to (Full Name) Stark Bray  Report received from (Full Name) Evelina Dun

## 2018-07-15 NOTE — Progress Notes (Signed)
[facility] [place] NAME: Jonathan Mcgrath    MR#:  [id number]  DATE OF BIRTH:  May 10, 1941  SUBJECTIVE:  Patient seen and eval noted today Seen at dialysis No complaints of abdominal pain No fever and chills No chest pain No nausea and vomiting Tolerated procedure well  REVIEW OF SYSTEMS:    Review of Systems  Constitutional: Negative for fever, chills weight loss HENT: Negative for ear pain, nosebleeds, congestion, facial swelling, rhinorrhea, neck pain, neck stiffness and ear discharge.   Respiratory: Negative for cough, shortness of breath, wheezing  Cardiovascular: Negative for chest pain, palpitations and leg swelling.  Gastrointestinal: Negative for heartburn, abdominal pain, vomiting, diarrhea or consitpation Genitourinary: Negative for dysuria, urgency, frequency, hematuria Musculoskeletal: Negative for back pain or joint pain Neurological: Negative for dizziness, seizures, syncope, focal weakness,  numbness and headaches.  Hematological: Does not bruise/bleed easily.  Psychiatric/Behavioral: Negative for hallucinations, confusion, dysphoric mood  Tolerating Diet: yes   DRUG ALLERGIES:  No Known Allergies  VITALS:  Blood pressure (!) 137/53, pulse 60, temperature (!) 97.5 F (36.4 C), temperature source Oral, resp. rate 11, height 6\' 1"  (1.854 m), weight 96 kg, SpO2 96 %.  PHYSICAL EXAMINATION:  Constitutional: Appears well-developed and well-nourished. No distress. HENT: Normocephalic. Marland Kitchen Oropharynx is clear and moist.  Eyes: Conjunctivae and EOM are normal. PERRLA, no scleral icterus.  Neck: Normal ROM. Neck supple. No JVD. No tracheal deviation. CVS: RRR, S1/S2 +, no murmurs, no gallops, no carotid bruit.  Pulmonary: Effort and breath sounds normal, no stridor, rhonchi, wheezes, rales.  Abdominal: Soft. BS +,  no distension, tenderness, rebound or guarding.  Musculoskeletal: Normal range of motion. No edema and no  tenderness.  Neuro: Alert. CN 2-12 grossly intact. No focal deficits. Skin: Skin is warm and dry. No rash noted. Psychiatric: Normal mood and affect.      LABORATORY PANEL:   CBC Recent Labs  Lab 07/15/18 0331  WBC 29.2*  HGB 9.2*  HCT 28.4*  PLT 215   ------------------------------------------------------------------------------------------------------------------  Chemistries  Recent Labs  Lab 07/14/18 0408  NA 138  K 3.8  CL 98  CO2 29  GLUCOSE 176*  BUN 62*  CREATININE 7.31*  CALCIUM 7.3*   ------------------------------------------------------------------------------------------------------------------  Cardiac Enzymes No results for input(s): TROPONINI in the last 168 hours. ------------------------------------------------------------------------------------------------------------------  RADIOLOGY:  No results found.   ASSESSMENT AND PLAN:   77 year old male patient with a history of colon cancer status post hemicolectomy October 2019, diabetes and end-stage renal disease on hemodialysis who presented to the emergency room due to abdominal pain.  1. Progressing Aortitis despite 8 weeks of IV ABX: This is etiology of patient's abdominal pain abd pain in this admission when he presented due to Clostridium septicum bacteremia from colon cancer  Vascular surgery procedure done today   - Abdominal aortogram with pelvic angiography             -  Coil embolization of the left internal iliac artery     3.  Percutaneous transluminal angioplasty and stent placement right common iliac artery; "kissing balloon" technique             -  Percutaneous transluminal and plasty and stent placement left common iliac artery; "kissing balloon" technique             -  Percutaneous transluminal angioplasty and stent placement left external iliac artery             - Ultrasound guided  access bilateral common femoral arteries               - StarClose closure device bilateral  common femoral arteries  Lifelong antibiotic therapy  2.  s/p hypoxic respiratory failure due to acute diastolic heart failure and volume overload due to end-stage renal disease Patient has been weaned off of oxygen  3.  End-stage renal disease on hemodialysis: Continue dialysis as per recommendations by nephrology that he is on Monday Wednesday and Friday  4.  Essential hypertension: Blood pressure ideally should be systolic less than 448 per vascular surgery recommendations. Continue Norvasc and Coreg, hydralazine  5.  Diabetes: Continue sliding scale  6.  Anemia chronic disease with stable hemoglobin Continue iron supplement  7.  Leukocytosis wprsening Discussed with ID Start patient on IV vancomycin and IV zosyn abx Stop IV unasyn  8.Palliative care team on board   Management plans discussed with the patient and he is in agreement.  CODE STATUS: Partial/DNI  TOTAL TIME TAKING CARE OF THIS PATIENT: 34 minutes.     POSSIBLE D/C next week DEPENDING ON CLINICAL CONDITION.   Saundra Shelling M.D on 07/15/2018 at 3:39 PM  Between 7am to 6pm - Pager - [phone] After 6pm go to [email or web address] - password [email or web address] [facility]  Office  [phone]  CC: Primary care physician; [employer]  Note: This dictation was prepared with Diplomatic Services operational officer dictation along with smaller Company secretary. Any transcriptional errors that result from this process are unintentional.

## 2018-07-15 NOTE — H&P (Signed)
[facility] History & Physical Update  The patient was interviewed and re-examined.  The patient's previous History and Physical has been reviewed and is unchanged.  There is no change in the plan of care. We plan to proceed with the scheduled procedure.  Leotis Pain, MD  07/15/2018, 10:24 AM

## 2018-07-15 NOTE — Care Management Important Message (Signed)
Copy of signed Medicare IM left in patient's room (out for procedure).  

## 2018-07-15 NOTE — Progress Notes (Signed)
Date of Admission:  07/08/2018      ID: Jonathan Mcgrath is a 77 y.o. 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 Lucky Cowboy, left by Dr. Delana Meyer             6.  Covered stent placement to the right common iliac artery down to the iliac bifurcation with 12 mm diameter by 58 mm length lifestream stent by Dr. Lucky Cowboy             7.  Covered stent placement to the left common and proximal external iliac artery with 12 mm diameter by 58 mm length lifestream stent by Dr. Delana Meyer Medications:  . amLODipine  10 mg Oral Daily  . aspirin EC  81 mg Oral Daily  . carvedilol  12.5 mg Oral BID WC  . Chlorhexidine Gluconate Cloth  6 each Topical Q0600  . cinacalcet  30 mg Oral BH-q7a  . epoetin (EPOGEN/PROCRIT) injection  10,000 Units Intravenous Q M,W,F-HD  . ferric citrate  210 mg Oral TID WC  . heparin  5,000 Units Subcutaneous Q8H  . hydrALAZINE  50 mg Oral Q8H  . insulin aspart  0-5 Units Subcutaneous QHS  . insulin aspart  0-9 Units Subcutaneous TID WC  . methylPREDNISolone (SOLU-MEDROL) injection  40 mg Intravenous Daily  . multivitamin  1 tablet Oral Daily    Objective: Vital signs in last 24 hours: Temp:  [97.4 F (36.3 C)-98.2 F (36.8 C)] 97.7 F (36.5 C) (12/30 1854) Pulse Rate:  [59-67] 64 (12/30 1854) Resp:  [9-20] 10 (12/30 1854) BP: (119-157)/(48-91) 157/59 (12/30 1854) SpO2:  [86 %-100 %] 96 % (12/30 1854) Weight:  [92.3 kg-96 kg] 94.6 kg (12/30 1854)  PHYSICAL EXAM: limited examination due to dilaysis General: Alert, cooperative, no distress, appears stated age.  HEENT-okay Rt catheter Lungs: b/l air entry Heart: Regular rate and rhythm, no  murmur, rub or gallop. Abdomen did not examine Extremities: did not examine Skin: did not examine Neurologic: Grossly non-focal  Lab Results Recent Labs    07/13/18 0341 07/14/18 0408 07/15/18 0331  WBC  --  27.1* 29.2*  HGB  --  9.1* 9.2*  HCT  --  28.4* 28.4*  NA 139 138  --   K 3.6 3.8  --   CL 99 98  --   CO2 29 29  --   BUN 41* 62*  --   CREATININE 5.84* 7.31*  --    Liver Panel No results for input(s): PROT, ALBUMIN, AST, ALT, ALKPHOS, BILITOT, BILIDIR, IBILI in the last 72 hours. Sedimentation Rate No results for input(s): ESRSEDRATE in the last 72 hours. C-Reactive Protein No results for input(s): CRP in the last 72 hours.  Microbiology:  Studies/Results: No results found.   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 ?Severe aortitis-progressing inspite of recent 8 weeks of IV antibiotics and then PO Augmentin. Thisisdue to clostridium septicum bacteremia diagnosed in Sept 2019 due to colon cancer.  C. septicum aortitis hasvery poor prognosis and without surgical intervention the mortality is high. As in this patient in spite of 8 weeks of appropriate IV antibiotics there is progression of the disease. He will need surgical intervention-but he is a high risk candidate.plan is for stenting after a few days of IV antibiotic Started unasyn 12/25 as blood culture negative and VAnco and zosyn were stopped. As leucocytosis worsening restart vanco and zosyn on 07/15/18  S/p stents placement in aorta, common iliac, rt and left iliac and external iliac on 07/15/18  Colon Cancer- s/p resection and primary anastomosis  ESRD on dialysis  HTN- management as per primary team  Discussed the  management with the patient and Dr.Pyreddy

## 2018-07-15 NOTE — Progress Notes (Signed)
Pre HD assessment    07/15/18 1501  Neurological  Level of Consciousness Alert  Orientation Level Oriented X4  Respiratory  Respiratory Pattern Regular;Unlabored  Chest Assessment Chest expansion symmetrical  Cardiac  Pulse Irregular  ECG Monitor Yes  Cardiac Rhythm SB  Vascular  R Radial Pulse +2  L Radial Pulse +2  Edema Generalized;Facial  Integumentary  Integumentary (WDL) X  Skin Color Appropriate for ethnicity  Musculoskeletal  Musculoskeletal (WDL) X  Generalized Weakness Yes  Assistive Device None  GU Assessment  Genitourinary (WDL) X  Genitourinary Symptoms  (HD)  Psychosocial  Psychosocial (WDL) WDL  Emotional support given Given to patient

## 2018-07-15 NOTE — Progress Notes (Signed)
Post HD assessment. PT tolerated tx well without c/o or complication. Net UF 6203, goal met.    07/15/18 1854  Vital Signs  Temp 97.7 F (36.5 C)  Temp Source Oral  Pulse Rate 64  Pulse Rate Source Monitor  Resp 10  BP (!) 157/59  BP Location Right Arm  BP Method Automatic  Patient Position (if appropriate) Lying  Oxygen Therapy  SpO2 96 %  O2 Device Room Air  Dialysis Weight  Weight 94.6 kg  Type of Weight Post-Dialysis  Post-Hemodialysis Assessment  Rinseback Volume (mL) 250 mL  KECN 75.3 V  Dialyzer Clearance Lightly streaked  Duration of HD Treatment -hour(s) 3.5 hour(s)  Hemodialysis Intake (mL) 700 mL  UF Total -Machine (mL) 2214 mL  Net UF (mL) 1514 mL  Tolerated HD Treatment Yes  AVG/AVF Arterial Site Held (minutes) 10 minutes  AVG/AVF Venous Site Held (minutes) 10 minutes  Education / Care Plan  Dialysis Education Provided Yes  Documented Education in Care Plan Yes  Fistula / Graft Left Upper arm Arteriovenous fistula  No Placement Date or Time found.   Placed prior to admission: Yes  Orientation: Left  Access Location: Upper arm  Access Type: Arteriovenous fistula  Site Condition No complications  Fistula / Graft Assessment Present;Thrill;Bruit  Status Deaccessed  Drainage Description None

## 2018-07-16 DIAGNOSIS — Z9889 Other specified postprocedural states: Secondary | ICD-10-CM

## 2018-07-16 DIAGNOSIS — Z452 Encounter for adjustment and management of vascular access device: Secondary | ICD-10-CM

## 2018-07-16 LAB — GLUCOSE, CAPILLARY
Glucose-Capillary: 80 mg/dL (ref 70–99)
Glucose-Capillary: 160 mg/dL — ABNORMAL HIGH (ref 70–99)
Glucose-Capillary: 215 mg/dL — ABNORMAL HIGH (ref 70–99)
Glucose-Capillary: 223 mg/dL — ABNORMAL HIGH (ref 70–99)

## 2018-07-16 LAB — BASIC METABOLIC PANEL
Anion gap: 8 (ref 5–15)
BUN: 40 mg/dL — ABNORMAL HIGH (ref 8–23)
CO2: 29 mmol/L (ref 22–32)
Calcium: 7.8 mg/dL — ABNORMAL LOW (ref 8.9–10.3)
Chloride: 102 mmol/L (ref 98–111)
Creatinine, Ser: 5.95 mg/dL — ABNORMAL HIGH (ref 0.61–1.24)
GFR calc Af Amer: 10 mL/min — ABNORMAL LOW (ref >60)
GFR calc non Af Amer: 8 mL/min — ABNORMAL LOW (ref >60)
Glucose, Bld: 115 mg/dL — ABNORMAL HIGH (ref 70–99)
Potassium: 3.7 mmol/L (ref 3.5–5.1)
Sodium: 139 mmol/L (ref 135–145)

## 2018-07-16 LAB — CBC
HCT: 27.5 % — ABNORMAL LOW (ref 39.0–52.0)
Hemoglobin: 8.8 g/dL — ABNORMAL LOW (ref 13.0–17.0)
MCH: 28.8 pg (ref 26.0–34.0)
MCHC: 32 g/dL (ref 30.0–36.0)
MCV: 89.9 fL (ref 80.0–100.0)
Platelets: 194 10*3/uL (ref 150–400)
RBC: 3.06 MIL/uL — ABNORMAL LOW (ref 4.22–5.81)
RDW: 19.2 % — ABNORMAL HIGH (ref 11.5–15.5)
WBC: 20.8 10*3/uL — ABNORMAL HIGH (ref 4.0–10.5)
nRBC: 0.1 % (ref 0.0–0.2)

## 2018-07-16 NOTE — Progress Notes (Signed)
[facility]  ROUNDING NOTE   Subjective:   Aortic stent placement yesterday by Dr. Lucky Cowboy and Dr. Delana Meyer  Hemodialysis treatment yesterday. Tolerated treatment well. UF of 1.5 litres   Objective:  Vital signs in last 24 hours:  Temp:  [97.5 F (36.4 C)-98.5 F (36.9 C)] 98.5 F (36.9 C) (12/31 0710) Pulse Rate:  [59-73] 72 (12/31 0710) Resp:  [9-20] 19 (12/31 0710) BP: (119-157)/(48-91) 147/61 (12/31 0710) SpO2:  [86 %-100 %] 98 % (12/31 0710) Weight:  [94.6 kg-96 kg] 94.6 kg (12/30 1854)  Weight change: 0 kg Filed Weights   07/15/18 1004 07/15/18 1500 07/15/18 1854  Weight: 92.3 kg 96 kg 94.6 kg    Intake/Output: I/O last 3 completed shifts: In: 53.3 [I.V.:0.3; IV Piggyback:53.1] Out: 0272 [Other:1514]   Intake/Output this shift:  Total I/O In: 240 [P.O.:240] Out: -   Physical Exam: General: No acute distress  Head: Normocephalic, atraumatic. Moist oral mucosal membranes  Eyes: Anicteric  Neck: Supple, trachea midline  Lungs:  clear  Heart: S1S2 no rubs  Abdomen:  Soft, nontender, bowel sounds present  Extremities: No peripheral edema.  Neurologic: Awake, alert, following commands  Skin: No lesions  Access: LUE AVF    Basic Metabolic Panel: Recent Labs  Lab 07/11/18 0633 07/12/18 1605 07/13/18 0341 07/14/18 0408 07/16/18 0320  NA 137 135 139 138 139  K 3.8 4.0 3.6 3.8 3.7  CL 98 96* 99 98 102  CO2 24 24 29 29 29   GLUCOSE 157* 211* 135* 176* 115*  BUN 56* 80* 41* 62* 40*  CREATININE 7.34* 9.37* 5.84* 7.31* 5.95*  CALCIUM 7.9* 7.4* 7.6* 7.3* 7.8*    Liver Function Tests: No results for input(s): AST, ALT, ALKPHOS, BILITOT, PROT, ALBUMIN in the last 168 hours. No results for input(s): LIPASE, AMYLASE in the last 168 hours. No results for input(s): AMMONIA in the last 168 hours.  CBC: Recent Labs  Lab 07/11/18 0633 07/14/18 0408 07/15/18 0331 07/16/18 0320  WBC 19.5* 27.1* 29.2* 20.8*  HGB 8.2* 9.1* 9.2* 8.8*  HCT 25.7*  28.4* 28.4* 27.5*  MCV 90.8 88.8 89.3 89.9  PLT 178 213 215 194    Cardiac Enzymes: No results for input(s): CKTOTAL, CKMB, CKMBINDEX, TROPONINI in the last 168 hours.  BNP: Invalid input(s): POCBNP  CBG: Recent Labs  Lab 07/15/18 1244 07/15/18 1443 07/15/18 2033 07/15/18 2118 07/16/18 0711  GLUCAP 102* 108* 117* 137* 5    Microbiology: Results for orders placed or performed during the hospital encounter of 07/08/18  CULTURE, BLOOD (ROUTINE X 2) w Reflex to ID Panel     Status: None   Collection Time: 07/08/18  8:49 PM  Result Value Ref Range Status   Specimen Description BLOOD BLOOD RIGHT HAND  Final   Special Requests   Final    BOTTLES DRAWN AEROBIC AND ANAEROBIC Blood Culture results may not be optimal due to an excessive volume of blood received in culture bottles   Culture   Final    NO GROWTH 5 DAYS Performed at [facility], [address] [place], [place] [address]    Report Status 07/13/2018 FINAL  Final  CULTURE, BLOOD (ROUTINE X 2) w Reflex to ID Panel     Status: None   Collection Time: 07/08/18  8:49 PM  Result Value Ref Range Status   Specimen Description BLOOD FINGER  Final   Special Requests   Final    BOTTLES DRAWN AEROBIC AND ANAEROBIC Blood Culture results may not be  optimal due to an excessive volume of blood received in culture bottles   Culture   Final    NO GROWTH 5 DAYS Performed at [facility], [address] [place], [place] [address]    Report Status 07/13/2018 FINAL  Final  MRSA PCR Screening     Status: None   Collection Time: 07/09/18 12:33 AM  Result Value Ref Range Status   MRSA by PCR NEGATIVE NEGATIVE Final    Comment:        The GeneXpert MRSA Assay (FDA approved for NASAL specimens only), is one component of a comprehensive MRSA colonization surveillance program. It is not intended to diagnose MRSA infection nor to guide or monitor treatment for MRSA infections. Performed at [facility],  [address] [place], [place] [address]     Coagulation Studies: No results for input(s): LABPROT, INR in the last 72 hours.  Urinalysis: No results for input(s): COLORURINE, LABSPEC, PHURINE, GLUCOSEU, HGBUR, BILIRUBINUR, KETONESUR, PROTEINUR, UROBILINOGEN, NITRITE, LEUKOCYTESUR in the last 72 hours.  Invalid input(s): APPERANCEUR    Imaging: No results found.   Medications:   . sodium chloride Stopped (07/15/18 2022)  . piperacillin-tazobactam (ZOSYN)  IV 3.375 g (07/16/18 0849)  . vancomycin    . vancomycin Stopped (07/15/18 1840)   . amLODipine  10 mg Oral Daily  . aspirin EC  81 mg Oral Daily  . carvedilol  12.5 mg Oral BID WC  . cinacalcet  30 mg Oral BH-q7a  . epoetin (EPOGEN/PROCRIT) injection  10,000 Units Intravenous Q M,W,F-HD  . ferric citrate  210 mg Oral TID WC  . heparin  5,000 Units Subcutaneous Q8H  . hydrALAZINE  50 mg Oral Q8H  . insulin aspart  0-5 Units Subcutaneous QHS  . insulin aspart  0-9 Units Subcutaneous TID WC  . multivitamin  1 tablet Oral Daily   sodium chloride, acetaminophen **OR** acetaminophen, hydrALAZINE, labetalol, ondansetron **OR** ondansetron (ZOFRAN) IV, polyethylene glycol  Assessment/ Plan:  77 y.o. black male with end stage renal disease on hemodialysis,  hypertension, diabetes mellitus, GERD, anemia, SHPTH, colon cancer status post resection on 10/31 by Dr. [name], CLL followed by Dr. Janese Banks, aortitis   [facility] MWF L AVF 92kg  1.  ESRD on HD on MWF.  Hemodialysis treatment yesterday. Tolerated treatment well.  Next scheduled treatment for tomorrow, continue MWF schedule.   2.  Anemia chronic kidney disease. Hemoglobin 8.8 -  Epogen 10,000 units IV with dialysis.  3.  Secondary hyperparathyroidism. - Auryxia with meals - Cinacalcet daily  4.  Hypertension. At goal.  - amlodipine, carvedilol, hydralazine.    5.  Aortitis:  Patient to be on lifelong antibiotics  Appreciate ID input. Currently on pip/tazo and  vanco Status post stent placement by vascular surgery on 07/15/18.    LOS: 8 Sharni Negron 12/31/201910:25 AM

## 2018-07-16 NOTE — Evaluation (Signed)
Physical Therapy Evaluation Patient Details Name: Jonathan Mcgrath MRN: [id number] DOB: 1941-05-13 Today's Date: 07/16/2018   History of Present Illness  Pt is a 77 y/o M who presented with abdominal pain.  Pt found to have aortitis. Pt is s/p stent placement by vascular surgery.  Pt with new Hickman line. Pt with h/o colon cancer s/p R hemicolectomy 10/19.  Pt's PMH includes CKD with HD MWF.      Clinical Impression  Pt admitted with above diagnosis. Pt currently with functional limitations due to the deficits listed below (see PT Problem List). Jonathan Mcgrath was very pleasant and agreeable to work with therapy.  His BP remained stable pre and post ambulation and balance testing.  Pt utilized RW to ambulate per his preference vs SPC. Pt scored a 42/56 on Berg Balance Test, indicating pt is at an increased risk of falling.  Pt will benefit from skilled PT to increase their independence and safety with mobility to allow discharge to the venue listed below.      Follow Up Recommendations Outpatient PT    Equipment Recommendations  Rolling walker with 5" wheels    Recommendations for Other Services       Precautions / Restrictions Precautions Precautions: Fall;Other (comment) Precaution Comments: Per verbal from Dr. Lucky Cowboy on 12/31, BP goal to remain below 299 systolic.  Restrictions Weight Bearing Restrictions: No      Mobility  Bed Mobility Overal bed mobility: Independent             General bed mobility comments: No physical assist or cues needed. Pt performs independently.   Transfers Overall transfer level: Needs assistance Equipment used: Rolling walker (2 wheeled) Transfers: Sit to/from Stand Sit to Stand: Supervision         General transfer comment: Pt remains steady.  Supervision for safety.   Ambulation/Gait Ambulation/Gait assistance: Supervision Gait Distance (Feet): 160 Feet Assistive device: Rolling walker (2 wheeled) Gait Pattern/deviations:  Step-through pattern     General Gait Details: Mildly unsteady with challenges to balance but otherwise steady and supervision provided for safety.  Pt prefers using RW vs SPC and thus RW was utilized.   Stairs            Wheelchair Mobility    Modified Rankin (Stroke Patients Only)       Balance Overall balance assessment: Needs assistance Sitting-balance support: No upper extremity supported;Feet supported Sitting balance-Leahy Scale: Normal     Standing balance support: No upper extremity supported;During functional activity Standing balance-Leahy Scale: Fair Standing balance comment: Pt demonstrates instability with challenges to balance in standing                 Standardized Balance Assessment Standardized Balance Assessment : Berg Balance Test Berg Balance Test Sit to Stand: Able to stand without using hands and stabilize independently Standing Unsupported: Able to stand safely 2 minutes Sitting with Back Unsupported but Feet Supported on Floor or Stool: Able to sit safely and securely 2 minutes Stand to Sit: Sits safely with minimal use of hands Transfers: Able to transfer safely, minor use of hands Standing Unsupported with Eyes Closed: Able to stand 10 seconds with supervision Standing Ubsupported with Feet Together: Able to place feet together independently and stand 1 minute safely From Standing, Reach Forward with Outstretched Arm: Can reach confidently >25 cm (10") From Standing Position, Pick up Object from Floor: Able to pick up shoe, needs supervision From Standing Position, Turn to Look Behind Over each Shoulder: Looks behind  from both sides and weight shifts well Turn 360 Degrees: Able to turn 360 degrees safely in 4 seconds or less Standing Unsupported, Alternately Place Feet on Step/Stool: Needs assistance to keep from falling or unable to try Standing Unsupported, One Foot in Front: Loses balance while stepping or standing Standing on One  Leg: Unable to try or needs assist to prevent fall Total Score: 42         Pertinent Vitals/Pain Pain Assessment: No/denies pain    Home Living Family/patient expects to be discharged to:: Private residence Living Arrangements: Alone Available Help at Discharge: Family;Available PRN/intermittently(brother lives nearby) Type of Home: House Home Access: Stairs to enter Entrance Stairs-Rails: Left Entrance Stairs-Number of Steps: 4 Home Layout: One level Home Equipment: Cane - single point      Prior Function Level of Independence: Independent with assistive device(s)         Comments: Pt recently d/c from [facility] after hospital stay and was home for 2 days before this admission.  Pt reports he was ambulating with SPC when he felt he needed it.  No falls in the past 6 months.  Says he eats out most of the time, doesn't do much cooking.  Ind with ADLs.      Hand Dominance        Extremity/Trunk Assessment   Upper Extremity Assessment Upper Extremity Assessment: Overall WFL for tasks assessed    Lower Extremity Assessment Lower Extremity Assessment: Overall WFL for tasks assessed    Cervical / Trunk Assessment Cervical / Trunk Assessment: Normal  Communication   Communication: HOH  Cognition Arousal/Alertness: Awake/alert Behavior During Therapy: WFL for tasks assessed/performed Overall Cognitive Status: Within Functional Limits for tasks assessed                                        General Comments General comments (skin integrity, edema, etc.): Pt scored a 42/56 on Berg Balance Test, indicating pt is at an increased risk of falling.  BP taken in sitting prior to ambulation: 112/71, taken again in sitting after ambulation 124/59.      Exercises Other Exercises Other Exercises: Encouraged pt to ambulate with nursing staff at least 3x/day   Assessment/Plan    PT Assessment Patient needs continued PT services  PT Problem List Decreased  balance       PT Treatment Interventions DME instruction;Gait training;Stair training;Balance training;Neuromuscular re-education;Patient/family education    PT Goals (Current goals can be found in the Care Plan section)  Acute Rehab PT Goals Patient Stated Goal: to go home and improve balance PT Goal Formulation: With patient Time For Goal Achievement: 07/30/18 Potential to Achieve Goals: Good    Frequency Min 2X/week   Barriers to discharge        Co-evaluation               AM-PAC PT "6 Clicks" Mobility  Outcome Measure Help needed turning from your back to your side while in a flat bed without using bedrails?: None Help needed moving from lying on your back to sitting on the side of a flat bed without using bedrails?: None Help needed moving to and from a bed to a chair (including a wheelchair)?: A Little Help needed standing up from a chair using your arms (e.g., wheelchair or bedside chair)?: A Little Help needed to walk in hospital room?: A Little Help needed climbing 3-5 steps  with a railing? : A Little 6 Click Score: 20    End of Session Equipment Utilized During Treatment: Gait belt Activity Tolerance: Patient tolerated treatment well Patient left: in bed;with call bell/phone within reach;with family/visitor present;with nursing/sitter in room Nurse Communication: Mobility status(BP readings) PT Visit Diagnosis: Unsteadiness on feet (R26.81)    Time: 9447-3958 PT Time Calculation (min) (ACUTE ONLY): 27 min   Charges:   PT Evaluation $PT Eval Low Complexity: 1 Low PT Treatments $Neuromuscular Re-education: 8-22 mins        Collie Siad PT, DPT 07/16/2018, 2:43 PM

## 2018-07-16 NOTE — Care Management Note (Signed)
Case Management Note  Patient Details  Name: JEREMIA GROOT MRN: [id number] Date of Birth: 04-02-1941  Subjective/Objective:      Patient is from home alone.  He does have a daughter and son that live close by.  He was admitted with aortitis.  Will most likely need life long antibiotic.  [place] referral made per patient request.  List of agencies and ratings provided.  He gets HD MWF in [place].  He is independent in all ADL's.  Denies difficulties obtaining medications or with medical care.  He is current with his PCP.  ID consult pending.  Will continue to follow and assist with discharge needs.              Action/Plan:   Expected Discharge Date:                  Expected Discharge Plan:  [place]  In-House Referral:     Discharge planning Services  CM Consult  Post Acute Care Choice:  Home Health Choice offered to:  Patient  DME Arranged:    DME Agency:     HH Arranged:  RN, IV Antibiotics, PT HH Agency:  [facility]  Status of Service:  In process, will continue to follow  If discussed at Long Length of Stay Meetings, dates discussed:    Additional Comments:  Elza Rafter, RN 07/16/2018, 12:19 PM

## 2018-07-16 NOTE — Progress Notes (Signed)
SATURATION QUALIFICATIONS: (This note is used to comply with regulatory documentation for home oxygen)  Patient Saturations on Room Air at Rest = 96%  Patient Saturations on Room Air while Ambulating = 92%  Patient Saturations on n/a Liters of oxygen while Ambulating = n/a%  Please briefly explain why patient needs home oxygen:  Pt ambulated around nurses station and tolerated well.

## 2018-07-16 NOTE — Progress Notes (Signed)
[facility]  Daily Progress Note   Subjective  - 1 Day Post-Op  Patient   Objective Vitals:   07/15/18 1856 07/15/18 1944 07/16/18 0437 07/16/18 0710  BP:  (!) 150/68 (!) 143/58 (!) 147/61  Pulse: 65 73 69 72  Resp: 17 18 18 19   Temp:  97.8 F (36.6 C) 98.3 F (36.8 C) 98.5 F (36.9 C)  TempSrc:  Oral Oral Oral  SpO2:  98% 96% 98%  Weight:      Height:        Intake/Output Summary (Last 24 hours) at 07/16/2018 0917 Last data filed at 07/16/2018 0700 Gross per 24 hour  Intake 53.33 ml  Output 1514 ml  Net -1460.67 ml    PULM  CTAB CV  RRR VASC  Access sites C/D/I  Laboratory CBC    Component Value Date/Time   WBC 20.8 (H) 07/16/2018 0320   HGB 8.8 (L) 07/16/2018 0320   HGB 10.6 (L) 04/12/2014 0433   HCT 27.5 (L) 07/16/2018 0320   HCT 33.5 (L) 04/12/2014 0433   PLT 194 07/16/2018 0320   PLT 123 (L) 04/12/2014 0433    BMET    Component Value Date/Time   NA 139 07/16/2018 0320   NA 140 04/13/2014 0507   K 3.7 07/16/2018 0320   K 4.6 04/13/2014 0507   CL 102 07/16/2018 0320   CL 109 (H) 04/13/2014 0507   CO2 29 07/16/2018 0320   CO2 24 04/13/2014 0507   GLUCOSE 115 (H) 07/16/2018 0320   GLUCOSE 139 (H) 04/13/2014 0507   BUN 40 (H) 07/16/2018 0320   BUN 79 (H) 04/13/2014 0507   CREATININE 5.95 (H) 07/16/2018 0320   CREATININE 9.83 (H) 04/13/2014 0507   CALCIUM 7.8 (L) 07/16/2018 0320   CALCIUM 6.8 (LL) 04/13/2014 0507   GFRNONAA 8 (L) 07/16/2018 0320   GFRNONAA 6 (L) 04/13/2014 0507   GFRNONAA 8 (L) 06/05/2013 0604   GFRAA 10 (L) 07/16/2018 0320   GFRAA 7 (L) 04/13/2014 0507   GFRAA 9 (L) 06/05/2013 0604    Assessment/Planning: POD #1 s/p bilateral iliac stenting, coil embolization of the left hypogastric   Doing well from surgical standpoint  Will need extending IV ABx and Hickman was placed for that reason.  No other recs from vascular point of view at this point  When stable to go from ID and medical  standpoint, OK to go from vascular POV  F/U in office in 3-4 weeks with aortic duplex    Leotis Pain  07/16/2018, 9:17 AM

## 2018-07-16 NOTE — Plan of Care (Signed)
Bilateral groin cath sites with PAD still intact, but it is deflated with dried blood around sites.  Problem: Clinical Measurements: Goal: Respiratory complications will improve Outcome: Progressing   Problem: Activity: Goal: Risk for activity intolerance will decrease Outcome: Progressing Note:  Up to bathroom with cane and standby assist, tolerating well   Problem: Nutrition: Goal: Adequate nutrition will be maintained Outcome: Progressing   Problem: Coping: Goal: Level of anxiety will decrease Outcome: Progressing   Problem: Elimination: Goal: Will not experience complications related to bowel motility Outcome: Progressing Note:  BM this shift   Problem: Pain Managment: Goal: General experience of comfort will improve Outcome: Progressing Note:  Complaints of groin/testicle pain, treated with tylenol which gave relief   Problem: Safety: Goal: Ability to remain free from injury will improve Outcome: Progressing

## 2018-07-16 NOTE — Progress Notes (Addendum)
[place] [facility] [place] NAME: Athel Merriweather    MR#:  [id number]  DATE OF BIRTH:  January 15, 1941  SUBJECTIVE:  Patient seen and eval noted today Postop day 1 No complaints of chest pain palpitations No fever No complaints of abdominal pain No nausea and vomiting Tolerated procedure well  REVIEW OF SYSTEMS:    Review of Systems  Constitutional: Negative for fever, chills weight loss HENT: Negative for ear pain, nosebleeds, congestion, facial swelling, rhinorrhea, neck pain, neck stiffness and ear discharge.   Respiratory: Negative for cough, shortness of breath, wheezing  Cardiovascular: Negative for chest pain, palpitations and leg swelling.  Gastrointestinal: Negative for heartburn, abdominal pain, vomiting, diarrhea or consitpation Genitourinary: Negative for dysuria, urgency, frequency, hematuria Musculoskeletal: Negative for back pain or joint pain Neurological: Negative for dizziness, seizures, syncope, focal weakness,  numbness and headaches.  Hematological: Does not bruise/bleed easily.  Psychiatric/Behavioral: Negative for hallucinations, confusion, dysphoric mood  Tolerating Diet: yes   DRUG ALLERGIES:  No Known Allergies  VITALS:  Blood pressure (!) 147/61, pulse 72, temperature 98.5 F (36.9 C), temperature source Oral, resp. rate 19, height 6\' 1"  (1.854 m), weight 94.6 kg, SpO2 98 %.  PHYSICAL EXAMINATION:  Constitutional: Appears well-developed and well-nourished. No distress. HENT: Normocephalic. Marland Kitchen Oropharynx is clear and moist.  Eyes: Conjunctivae and EOM are normal. PERRLA, no scleral icterus.  Neck: Normal ROM. Neck supple. No JVD. No tracheal deviation. CVS: RRR, S1/S2 +, no murmurs, no gallops, no carotid bruit.  Pulmonary: Effort and breath sounds normal, no stridor, rhonchi, wheezes, rales.  Abdominal: Soft. BS +,  no distension, tenderness, rebound or guarding.  Musculoskeletal: Normal range of motion. No edema  and no tenderness.  Neuro: Alert. CN 2-12 grossly intact. No focal deficits. Skin: Skin is warm and dry. No rash noted. Psychiatric: Normal mood and affect.      LABORATORY PANEL:   CBC Recent Labs  Lab 07/16/18 0320  WBC 20.8*  HGB 8.8*  HCT 27.5*  PLT 194   ------------------------------------------------------------------------------------------------------------------  Chemistries  Recent Labs  Lab 07/16/18 0320  NA 139  K 3.7  CL 102  CO2 29  GLUCOSE 115*  BUN 40*  CREATININE 5.95*  CALCIUM 7.8*   ------------------------------------------------------------------------------------------------------------------  Cardiac Enzymes No results for input(s): TROPONINI in the last 168 hours. ------------------------------------------------------------------------------------------------------------------  RADIOLOGY:  No results found.   ASSESSMENT AND PLAN:   77 year old male patient with a history of colon cancer status post hemicolectomy October 2019, diabetes and end-stage renal disease on hemodialysis who presented to the emergency room due to abdominal pain.  1. 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  Percutaneous transluminal angioplasty and stent placement right common iliac artery; "kissing balloon" technique             -  Percutaneous transluminal and plasty and stent placement left common iliac artery; "kissing balloon" technique             -  Percutaneous transluminal angioplasty and stent placement left external iliac artery             - Ultrasound guided  access bilateral common femoral arteries               - StarClose closure  device bilateral common femoral arteries  Needs long-term IV antibiotics currently on vancomycin and Zosyn Discussed with infectious disease attending Has Hickman's catheter  2.  s/p hypoxic respiratory failure due to acute diastolic heart failure and volume overload due to end-stage renal disease Patient has been weaned off of oxygen  3.  End-stage renal disease on hemodialysis: Continue dialysis as per recommendations by nephrology that he is on Monday Wednesday and Friday Patient will need IV vancomycin at dialysis when he goes home to  4.  Essential hypertension: Blood pressure ideally should be systolic less than 482 per vascular surgery recommendations. Continue Norvasc and Coreg, hydralazine  5.  Diabetes: Continue sliding scale  6.  Anemia chronic disease with stable hemoglobin Continue iron supplement  7.  Leukocytosis improving Discussed with ID Continue IV vancomycin and IV zosyn abx  8.  Physical therapy evaluation   Management plans discussed with the patient and he is in agreement.  CODE STATUS: Partial/DNI  TOTAL TIME TAKING CARE OF THIS PATIENT: 35 minutes.     POSSIBLE D/C next week DEPENDING ON CLINICAL CONDITION.   Saundra Shelling M.D on 07/16/2018 at 12:20 PM  Between 7am to 6pm - Pager - [phone] After 6pm go to [email or web address] - password [email or web address] [facility]  Office  [phone]  CC: Primary care physician; [employer]  Note: This dictation was prepared with Diplomatic Services operational officer dictation along with smaller Company secretary. Any transcriptional errors that result from this process are unintentional.

## 2018-07-16 NOTE — Progress Notes (Signed)
   Date of Admission:  07/08/2018      ID: Jonathan Mcgrath is a 77 y.o. male  Active Problems:   Aortitis ([place]) H/o clostridium septicum bacteremia and likely cause of aortitis Colon ca -s/p hemicolectomy   Subjective: Doing well No pain abdomen Medications:  . amLODipine  10 mg Oral Daily  . aspirin EC  81 mg Oral Daily  . carvedilol  12.5 mg Oral BID WC  . cinacalcet  30 mg Oral BH-q7a  . epoetin (EPOGEN/PROCRIT) injection  10,000 Units Intravenous Q M,W,F-HD  . ferric citrate  210 mg Oral TID WC  . heparin  5,000 Units Subcutaneous Q8H  . hydrALAZINE  50 mg Oral Q8H  . insulin aspart  0-5 Units Subcutaneous QHS  . insulin aspart  0-9 Units Subcutaneous TID WC  . multivitamin  1 tablet Oral Daily    Objective: Vital signs in last 24 hours: Temp:  [97.5 F (36.4 C)-98.5 F (36.9 C)] 97.5 F (36.4 C) (12/31 1602) Pulse Rate:  [69-73] 69 (12/31 1602) Resp:  [18-19] 19 (12/31 1602) BP: (130-150)/(57-68) 135/57 (12/31 1602) SpO2:  [91 %-98 %] 91 % (12/31 1602)  PHYSICAL EXAM: General: Alert, cooperative, no distress, appears stated age.  HEENT-okay Rt hickman Lungs: b/l air entry Heart: Regular rate and rhythm, no murmur, rub or gallop. Abdomen not tender Extremities: no edema Skin: ok Neurologic: Grossly non-focal  Lab Results Recent Labs    07/14/18 0408 07/15/18 0331 07/16/18 0320  WBC 27.1* 29.2* 20.8*  HGB 9.1* 9.2* 8.8*  HCT 28.4* 28.4* 27.5*  NA 138  --  139  K 3.8  --  3.7  CL 98  --  102  CO2 29  --  29  BUN 62*  --  40*  CREATININE 7.31*  --  5.95*   Liver Panel No results for input(s): PROT, ALBUMIN, AST, ALT, ALKPHOS, BILITOT, BILIDIR, IBILI in the last 72 hours. Sedimentation Rate No results for input(s): ESRSEDRATE in the last 72 hours. C-Reactive Protein No results for input(s): CRP in the last 72 hours.  Microbiology:  Studies/Results: No results found.   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 ?Severe aortitis-progressing inspite of recent 8 weeks of IV antibiotics and then PO Augmentin. Thisisdue to clostridium septicum bacteremia diagnosed in Sept 2019 due to colon cancer.  C. septicum aortitis hasvery poor prognosis and without surgical intervention the mortality is high. As in this patient in spite of 8 weeks of appropriate IV antibiotics there is progression of the disease. He will need surgical intervention-but he is a high risk candidate.plan is for stenting after a few days of IV antibiotic Started unasyn 12/25 as blood culture negative and VAnco and zosyn were stopped. As leucocytosis worsening restarted vanco and zosyn on 07/15/18 and there is improvement. So he will go home on these 2 antibiotics- vanco during dialysis And zosyn he will administer at home   S/p stents placement in aorta, common iliac, rt and left iliac and external iliac on 07/15/18  Colon Cancer- s/p resection and primary anastomosis  ESRD on dialysis  HTN- management as per primary team  Discussed the management with the patient and Dr.Pyreddy

## 2018-07-17 LAB — RENAL FUNCTION PANEL
Albumin: 2.7 g/dL — ABNORMAL LOW (ref 3.5–5.0)
Anion gap: 12 (ref 5–15)
BUN: 53 mg/dL — ABNORMAL HIGH (ref 8–23)
CO2: 26 mmol/L (ref 22–32)
Calcium: 7.8 mg/dL — ABNORMAL LOW (ref 8.9–10.3)
Chloride: 102 mmol/L (ref 98–111)
Creatinine, Ser: 7.9 mg/dL — ABNORMAL HIGH (ref 0.61–1.24)
GFR calc Af Amer: 7 mL/min — ABNORMAL LOW (ref >60)
GFR calc non Af Amer: 6 mL/min — ABNORMAL LOW (ref >60)
Glucose, Bld: 144 mg/dL — ABNORMAL HIGH (ref 70–99)
Phosphorus: 4.8 mg/dL — ABNORMAL HIGH (ref 2.5–4.6)
Potassium: 3.6 mmol/L (ref 3.5–5.1)
Sodium: 140 mmol/L (ref 135–145)

## 2018-07-17 LAB — CBC
HCT: 25.5 % — ABNORMAL LOW (ref 39.0–52.0)
Hemoglobin: 8.3 g/dL — ABNORMAL LOW (ref 13.0–17.0)
MCH: 29.6 pg (ref 26.0–34.0)
MCHC: 32.5 g/dL (ref 30.0–36.0)
MCV: 91.1 fL (ref 80.0–100.0)
Platelets: 185 10*3/uL (ref 150–400)
RBC: 2.8 MIL/uL — ABNORMAL LOW (ref 4.22–5.81)
RDW: 19.9 % — ABNORMAL HIGH (ref 11.5–15.5)
WBC: 18.1 10*3/uL — ABNORMAL HIGH (ref 4.0–10.5)
nRBC: 0 % (ref 0.0–0.2)

## 2018-07-17 LAB — GLUCOSE, CAPILLARY
Glucose-Capillary: 98 mg/dL (ref 70–99)
Glucose-Capillary: 130 mg/dL — ABNORMAL HIGH (ref 70–99)
Glucose-Capillary: 131 mg/dL — ABNORMAL HIGH (ref 70–99)

## 2018-07-17 NOTE — Progress Notes (Addendum)
[facility]  ROUNDING NOTE   Subjective:   Seen and examined on hemodialysis. Tolerating treatment well. UF goal of 2 liters.     HEMODIALYSIS FLOWSHEET:  Blood Flow Rate (mL/min): 400 mL/min Arterial Pressure (mmHg): -130 mmHg Venous Pressure (mmHg): 210 mmHg Transmembrane Pressure (mmHg): 60 mmHg Ultrafiltration Rate (mL/min): 710 mL/min Dialysate Flow Rate (mL/min): 600 ml/min Conductivity: Machine : 13.8 Conductivity: Machine : 13.8 Dialysis Fluid Bolus: Normal Saline Bolus Amount (mL): 250 mL Dialysate Change: (3k 2.5ca)    Objective:  Vital signs in last 24 hours:  Temp:  [97.5 F (36.4 C)-98 F (36.7 C)] 98 F (36.7 C) (01/01 7858) Pulse Rate:  [61-71] 62 (01/01 1215) Resp:  [12-20] 15 (01/01 1215) BP: (130-166)/(51-88) 151/60 (01/01 1215) SpO2:  [91 %-98 %] 95 % (01/01 1215) Weight:  [94.1 kg-94.2 kg] 94.1 kg (01/01 0938)  Weight change: 1.827 kg Filed Weights   07/15/18 1854 07/17/18 0432 07/17/18 0938  Weight: 94.6 kg 94.2 kg 94.1 kg    Intake/Output: I/O last 3 completed shifts: In: 587.9 [P.O.:500; I.V.:1.4; IV Piggyback:86.5] Out: 0    Intake/Output this shift:  No intake/output data recorded.  Physical Exam: General: No acute distress  Head: Normocephalic, atraumatic. Moist oral mucosal membranes  Eyes: Anicteric  Neck: Supple, trachea midline  Lungs:  clear  Heart: S1S2 no rubs  Abdomen:  Soft, nontender, bowel sounds present  Extremities: No peripheral edema.  Neurologic: Awake, alert, following commands  Skin: No lesions  Access: LUE AVF    Basic Metabolic Panel: Recent Labs  Lab 07/12/18 1605 07/13/18 0341 07/14/18 0408 07/16/18 0320 07/17/18 0933  NA 135 139 138 139 140  K 4.0 3.6 3.8 3.7 3.6  CL 96* 99 98 102 102  CO2 24 29 29 29 26   GLUCOSE 211* 135* 176* 115* 144*  BUN 80* 41* 62* 40* 53*  CREATININE 9.37* 5.84* 7.31* 5.95* 7.90*  CALCIUM 7.4* 7.6* 7.3* 7.8* 7.8*  PHOS  --   --   --   --  4.8*     Liver Function Tests: Recent Labs  Lab 07/17/18 0933  ALBUMIN 2.7*   No results for input(s): LIPASE, AMYLASE in the last 168 hours. No results for input(s): AMMONIA in the last 168 hours.  CBC: Recent Labs  Lab 07/11/18 0633 07/14/18 0408 07/15/18 0331 07/16/18 0320 07/17/18 0409  WBC 19.5* 27.1* 29.2* 20.8* 18.1*  HGB 8.2* 9.1* 9.2* 8.8* 8.3*  HCT 25.7* 28.4* 28.4* 27.5* 25.5*  MCV 90.8 88.8 89.3 89.9 91.1  PLT 178 213 215 194 185    Cardiac Enzymes: No results for input(s): CKTOTAL, CKMB, CKMBINDEX, TROPONINI in the last 168 hours.  BNP: Invalid input(s): POCBNP  CBG: Recent Labs  Lab 07/16/18 0711 07/16/18 1154 07/16/18 1603 07/16/18 2112 07/17/18 0724  GLUCAP 80 160* 215* 223* 75    Microbiology: Results for orders placed or performed during the hospital encounter of 07/08/18  CULTURE, BLOOD (ROUTINE X 2) w Reflex to ID Panel     Status: None   Collection Time: 07/08/18  8:49 PM  Result Value Ref Range Status   Specimen Description BLOOD BLOOD RIGHT HAND  Final   Special Requests   Final    BOTTLES DRAWN AEROBIC AND ANAEROBIC Blood Culture results may not be optimal due to an excessive volume of blood received in culture bottles   Culture   Final    NO GROWTH 5 DAYS Performed at [facility], [address], [place], [place] [address]  Report Status 07/13/2018 FINAL  Final  CULTURE, BLOOD (ROUTINE X 2) w Reflex to ID Panel     Status: None   Collection Time: 07/08/18  8:49 PM  Result Value Ref Range Status   Specimen Description BLOOD FINGER  Final   Special Requests   Final    BOTTLES DRAWN AEROBIC AND ANAEROBIC Blood Culture results may not be optimal due to an excessive volume of blood received in culture bottles   Culture   Final    NO GROWTH 5 DAYS Performed at [facility], [address] [place], [place] [address]    Report Status 07/13/2018 FINAL  Final  MRSA PCR Screening     Status: None   Collection Time:  07/09/18 12:33 AM  Result Value Ref Range Status   MRSA by PCR NEGATIVE NEGATIVE Final    Comment:        The GeneXpert MRSA Assay (FDA approved for NASAL specimens only), is one component of a comprehensive MRSA colonization surveillance program. It is not intended to diagnose MRSA infection nor to guide or monitor treatment for MRSA infections. Performed at [facility], [address] [place], [place] [address]     Coagulation Studies: No results for input(s): LABPROT, INR in the last 72 hours.  Urinalysis: No results for input(s): COLORURINE, LABSPEC, PHURINE, GLUCOSEU, HGBUR, BILIRUBINUR, KETONESUR, PROTEINUR, UROBILINOGEN, NITRITE, LEUKOCYTESUR in the last 72 hours.  Invalid input(s): APPERANCEUR    Imaging: No results found.   Medications:   . sodium chloride Stopped (07/16/18 0849)  . piperacillin-tazobactam (ZOSYN)  IV 3.375 g (07/17/18 0820)  . vancomycin    . vancomycin 1,000 mg (07/17/18 1205)   . amLODipine  10 mg Oral Daily  . aspirin EC  81 mg Oral Daily  . carvedilol  12.5 mg Oral BID WC  . cinacalcet  30 mg Oral BH-q7a  . epoetin (EPOGEN/PROCRIT) injection  10,000 Units Intravenous Q M,W,F-HD  . ferric citrate  210 mg Oral TID WC  . heparin  5,000 Units Subcutaneous Q8H  . hydrALAZINE  50 mg Oral Q8H  . insulin aspart  0-5 Units Subcutaneous QHS  . insulin aspart  0-9 Units Subcutaneous TID WC  . multivitamin  1 tablet Oral Daily   sodium chloride, acetaminophen **OR** acetaminophen, hydrALAZINE, labetalol, ondansetron **OR** ondansetron (ZOFRAN) IV, polyethylene glycol  Assessment/ Plan:  78 y.o. black male with end stage renal disease on hemodialysis,  hypertension, diabetes mellitus, GERD, anemia, SHPTH, colon cancer status post resection on 10/31 by Dr. [name], CLL followed by Dr. Janese Banks, aortitis   [facility] MWF L AVF 92kg  1.  ESRD on HD on MWF.  Seen and examined on hemodialysis treatment. Tolerating treatment well.   Continue MWF schedule.   2.  Anemia chronic kidney disease. Hemoglobin 8.3 -  Epogen 10,000 units IV with dialysis.  3.  Secondary hyperparathyroidism. - Auryxia with meals - Cinacalcet daily  4.  Hypertension. At goal.  - amlodipine, carvedilol, hydralazine.    5.  Aortitis:   Appreciate ID input. Currently on pip/tazo 3.375mg  bid  and vanco 1 gram with dialysis. End date of 09/11/18. Status post stent placement by vascular surgery on 07/15/18.  Discussed case with Dr. Delaine Lame, ID - patient may be on lifelong antibiotics.    LOS: 9 Tanysha Quant 1/1/202012:24 PM

## 2018-07-17 NOTE — Care Management (Signed)
CM consult for medications needs placed.  This RNCM has made referral to [facility] for home IV antibiotics.  Brad with [facility] is aware of Zosyn 3.375mg  every 12 hrs. until 09-11-18 with weekly lab draw.

## 2018-07-17 NOTE — Progress Notes (Signed)
Pt is AOx4, vss, he is in bed with rails upx2, on the monitor and resting comfortably. He denies all pain, shortness of breath and weakness at this time. Pt does not show any signs of distress. We will continue to monitor the pt.

## 2018-07-17 NOTE — Progress Notes (Signed)
[place] [facility] [place] NAME: Jonathan Mcgrath    MR#:  [id number]  DATE OF BIRTH:  1941-05-22  SUBJECTIVE:  Patient was seen in hemodialysis she denies any complaints  REVIEW OF SYSTEMS:    Review of Systems  Constitutional: Negative for fever, chills weight loss HENT: Negative for ear pain, nosebleeds, congestion, facial swelling, rhinorrhea, neck pain, neck stiffness and ear discharge.   Respiratory: Negative for cough, shortness of breath, wheezing  Cardiovascular: Negative for chest pain, palpitations and leg swelling.  Gastrointestinal: Negative for heartburn, abdominal pain, vomiting, diarrhea or consitpation Genitourinary: Negative for dysuria, urgency, frequency, hematuria Musculoskeletal: Negative for back pain or joint pain Neurological: Negative for dizziness, seizures, syncope, focal weakness,  numbness and headaches.  Hematological: Does not bruise/bleed easily.  Psychiatric/Behavioral: Negative for hallucinations, confusion, dysphoric mood  Tolerating Diet: yes   DRUG ALLERGIES:  No Known Allergies  VITALS:  Blood pressure (!) 152/59, pulse 62, temperature 97.6 F (36.4 C), temperature source Oral, resp. rate 12, height 6\' 1"  (1.854 m), weight 92.1 kg, SpO2 94 %.  PHYSICAL EXAMINATION:  Constitutional: Appears well-developed and well-nourished. No distress. HENT: Normocephalic. Marland Kitchen Oropharynx is clear and moist.  Eyes: Conjunctivae and EOM are normal. PERRLA, no scleral icterus.  Neck: Normal ROM. Neck supple. No JVD. No tracheal deviation. CVS: RRR, S1/S2 +, no murmurs, no gallops, no carotid bruit.  Pulmonary: Effort and breath sounds normal, no stridor, rhonchi, wheezes, rales.  Abdominal: Soft. BS +,  no distension, tenderness, rebound or guarding.  Musculoskeletal: Normal range of motion. No edema and no tenderness.  Neuro: Alert. CN 2-12 grossly intact. No focal deficits. Skin: Skin is warm and dry. No rash  noted. Psychiatric: Normal mood and affect.      LABORATORY PANEL:   CBC Recent Labs  Lab 07/17/18 0409  WBC 18.1*  HGB 8.3*  HCT 25.5*  PLT 185   ------------------------------------------------------------------------------------------------------------------  Chemistries  Recent Labs  Lab 07/17/18 0933  NA 140  K 3.6  CL 102  CO2 26  GLUCOSE 144*  BUN 53*  CREATININE 7.90*  CALCIUM 7.8*   ------------------------------------------------------------------------------------------------------------------  Cardiac Enzymes No results for input(s): TROPONINI in the last 168 hours. ------------------------------------------------------------------------------------------------------------------  RADIOLOGY:  No results found.   ASSESSMENT AND PLAN:   78 year old male patient with a history of colon cancer status post hemicolectomy October 2019, diabetes and end-stage renal disease on hemodialysis who presented to the emergency room due to abdominal pain.  1. 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  Percutaneous transluminal angioplasty and stent placement right common iliac artery; "kissing balloon" technique             -  Percutaneous transluminal and plasty and stent placement left common iliac artery; "kissing balloon" technique             -  Percutaneous transluminal angioplasty and stent placement left external iliac artery             - Ultrasound guided access bilateral common femoral arteries               -  StarClose closure device bilateral common femoral arteries  Needs long-term IV antibiotics currently on vancomycin and Zosyn IV  antibiotics instructions given by the ID doctor Has Hickman's catheter  2.  s/p hypoxic respiratory failure due to acute diastolic heart failure and volume overload due to end-stage renal disease Patient has been weaned off of oxygen  3.  End-stage renal disease on hemodialysis: Continue dialysis as per recommendations by nephrology that he is on Monday Wednesday and Friday Patient will need IV vancomycin at dialysis when he goes home to  4.  Essential hypertension: Blood pressure ideally should be systolic less than 233 per vascular surgery recommendations. Continue Norvasc and Coreg, hydralazine  5.  Diabetes: Continue sliding scale  6.  Anemia chronic disease with stable hemoglobin Continue iron supplement  7.  Leukocytosis follow CBC normal   8.  Physical therapy evaluation  Awaiting home antibiotics set up  CODE STATUS: Partial/DNI  TOTAL TIME TAKING CARE OF THIS PATIENT: 35 minutes.     POSSIBLE D/C next week DEPENDING ON CLINICAL CONDITION.   Dustin Flock M.D on 07/17/2018 at 3:45 PM  Between 7am to 6pm - Pager - [phone] After 6pm go to [email or web address] - password [email or web address] [facility]  Office  [phone]  CC: Primary care physician; [employer]  Note: This dictation was prepared with Diplomatic Services operational officer dictation along with smaller Company secretary. Any transcriptional errors that result from this process are unintentional.

## 2018-07-17 NOTE — Treatment Plan (Signed)
Diagnosis: aortitis Baseline Creatinine ESRD on dialysis   No Known Allergies  OPAT Orders Discharge antibiotics:  Vancomycin 1 gram during dialysis  Per pharmacy protocol Duration 8 weeks until  09/11/18    Zosyn 3.375 grams IV every 12 hours until 09/11/18- to be done at home by the patient  Central catheter Care Per Protocol:  Labs weekly while on IV antibiotics: __X CBC with differential  _X_ CMP X CRP X vanco level ( done by dialysis center)   __ Please leave PIC in place until doctor has seen patient or been notified  Fax weekly labs to [phone]  Clinic Follow Up Appt 3 weeks   Call [phone] to make appt

## 2018-07-17 NOTE — Progress Notes (Signed)
HD completed without issue. UF goal 2L met as ordered. Patient without complaints. All vitals stable. Report given to primary RN. Received vancomycin with last hour of HD as ordered.

## 2018-07-17 NOTE — Progress Notes (Signed)
Palliative:  Patient in dialysis so was not seen today. Would be a good outpatient palliative consult. Please recommend outpatient palliative care in d/c summary.   No charge  Vinie Sill, NP Palliative Medicine Team Pager # [phone] (M-F 8a-5p) Team Phone # [phone] (Nights/Weekends)

## 2018-07-18 DIAGNOSIS — Z95820 Peripheral vascular angioplasty status with implants and grafts: Secondary | ICD-10-CM

## 2018-07-18 LAB — CBC
HCT: 26.9 % — ABNORMAL LOW (ref 39.0–52.0)
Hemoglobin: 8.7 g/dL — ABNORMAL LOW (ref 13.0–17.0)
MCH: 29.8 pg (ref 26.0–34.0)
MCHC: 32.3 g/dL (ref 30.0–36.0)
MCV: 92.1 fL (ref 80.0–100.0)
Platelets: 180 10*3/uL (ref 150–400)
RBC: 2.92 MIL/uL — ABNORMAL LOW (ref 4.22–5.81)
RDW: 20.6 % — ABNORMAL HIGH (ref 11.5–15.5)
WBC: 17 10*3/uL — ABNORMAL HIGH (ref 4.0–10.5)
nRBC: 0.1 % (ref 0.0–0.2)

## 2018-07-18 LAB — GLUCOSE, CAPILLARY
Glucose-Capillary: 100 mg/dL — ABNORMAL HIGH (ref 70–99)
Glucose-Capillary: 147 mg/dL — ABNORMAL HIGH (ref 70–99)
Glucose-Capillary: 141 mg/dL — ABNORMAL HIGH (ref 70–99)

## 2018-07-18 MED ORDER — HEPARIN SOD (PORK) LOCK FLUSH 100 UNIT/ML IV SOLN
250.0000 [IU] | INTRAVENOUS | Status: AC | PRN
  Administered 2018-07-18: 250 [IU]

## 2018-07-18 MED ORDER — VANCOMYCIN IV (FOR PTA / DISCHARGE USE ONLY): 1000.0000 mg | INTRAVENOUS | 0 refills | Status: AC

## 2018-07-18 MED ORDER — SODIUM CHLORIDE 0.9% FLUSH: 10.0000 mL | INTRAVENOUS | Status: DC | PRN

## 2018-07-18 MED ORDER — PIPERACILLIN-TAZOBACTAM IV (FOR PTA / DISCHARGE USE ONLY): 3.3750 g | Freq: Two times a day (BID) | INTRAVENOUS | 0 refills | Status: AC

## 2018-07-18 MED ORDER — HEPARIN SOD (PORK) LOCK FLUSH 100 UNIT/ML IV SOLN
250.0000 [IU] | INTRAVENOUS | Status: DC | PRN
  Filled 2018-07-18: qty 5

## 2018-07-18 NOTE — Progress Notes (Signed)
New referral for outpatient Palliative to follow at home received from [facility]. Plan is for discharge today, patient will be followed by [facility]. Patient information faxed to referral. Flo Shanks RN, BSN, [facility] liaison [phone]

## 2018-07-18 NOTE — Progress Notes (Signed)
[facility]  ROUNDING NOTE   Subjective:   Hemodialysis treatment yesterday. Tolerated treatment well. UF of 2 liters.   Objective:  Vital signs in last 24 hours:  Temp:  [97.6 F (36.4 C)-98.4 F (36.9 C)] 98.2 F (36.8 C) (01/02 0611) Pulse Rate:  [61-70] 69 (01/02 0611) Resp:  [12-17] 16 (01/02 0611) BP: (124-166)/(49-67) 149/62 (01/02 0611) SpO2:  [91 %-99 %] 93 % (01/02 0611) Weight:  [92.1 kg-92.6 kg] 92.6 kg (01/02 0630)  Weight change: -0.067 kg Filed Weights   07/17/18 0938 07/17/18 1314 07/18/18 0630  Weight: 94.1 kg 92.1 kg 92.6 kg    Intake/Output: I/O last 3 completed shifts: In: 91.4 [P.O.:20; I.V.:21.4; IV Piggyback:50] Out: 2000 [Other:2000]   Intake/Output this shift:  No intake/output data recorded.  Physical Exam: General: No acute distress  Head: Normocephalic, atraumatic. Moist oral mucosal membranes  Eyes: Anicteric  Neck: Supple, trachea midline  Lungs:  clear  Heart: S1S2 no rubs  Abdomen:  Soft, nontender, bowel sounds present  Extremities: No peripheral edema.  Neurologic: Awake, alert, following commands  Skin: No lesions  Access: LUE AVF    Basic Metabolic Panel: Recent Labs  Lab 07/12/18 1605 07/13/18 0341 07/14/18 0408 07/16/18 0320 07/17/18 0933  NA 135 139 138 139 140  K 4.0 3.6 3.8 3.7 3.6  CL 96* 99 98 102 102  CO2 24 29 29 29 26   GLUCOSE 211* 135* 176* 115* 144*  BUN 80* 41* 62* 40* 53*  CREATININE 9.37* 5.84* 7.31* 5.95* 7.90*  CALCIUM 7.4* 7.6* 7.3* 7.8* 7.8*  PHOS  --   --   --   --  4.8*    Liver Function Tests: Recent Labs  Lab 07/17/18 0933  ALBUMIN 2.7*   No results for input(s): LIPASE, AMYLASE in the last 168 hours. No results for input(s): AMMONIA in the last 168 hours.  CBC: Recent Labs  Lab 07/14/18 0408 07/15/18 0331 07/16/18 0320 07/17/18 0409 07/18/18 0523  WBC 27.1* 29.2* 20.8* 18.1* 17.0*  HGB 9.1* 9.2* 8.8* 8.3* 8.7*  HCT 28.4* 28.4* 27.5* 25.5* 26.9*  MCV 88.8 89.3  89.9 91.1 92.1  PLT 213 215 194 185 180    Cardiac Enzymes: No results for input(s): CKTOTAL, CKMB, CKMBINDEX, TROPONINI in the last 168 hours.  BNP: Invalid input(s): POCBNP  CBG: Recent Labs  Lab 07/16/18 2112 07/17/18 0724 07/17/18 1648 07/17/18 2023 07/18/18 0613  GLUCAP 223* 98 130* 131* 100*    Microbiology: Results for orders placed or performed during the hospital encounter of 07/08/18  CULTURE, BLOOD (ROUTINE X 2) w Reflex to ID Panel     Status: None   Collection Time: 07/08/18  8:49 PM  Result Value Ref Range Status   Specimen Description BLOOD BLOOD RIGHT HAND  Final   Special Requests   Final    BOTTLES DRAWN AEROBIC AND ANAEROBIC Blood Culture results may not be optimal due to an excessive volume of blood received in culture bottles   Culture   Final    NO GROWTH 5 DAYS Performed at [facility], [address] [place], [place] [address]    Report Status 07/13/2018 FINAL  Final  CULTURE, BLOOD (ROUTINE X 2) w Reflex to ID Panel     Status: None   Collection Time: 07/08/18  8:49 PM  Result Value Ref Range Status   Specimen Description BLOOD FINGER  Final   Special Requests   Final    BOTTLES DRAWN AEROBIC AND ANAEROBIC Blood Culture  results may not be optimal due to an excessive volume of blood received in culture bottles   Culture   Final    NO GROWTH 5 DAYS Performed at [facility], [address] [place], [place] [address]    Report Status 07/13/2018 FINAL  Final  MRSA PCR Screening     Status: None   Collection Time: 07/09/18 12:33 AM  Result Value Ref Range Status   MRSA by PCR NEGATIVE NEGATIVE Final    Comment:        The GeneXpert MRSA Assay (FDA approved for NASAL specimens only), is one component of a comprehensive MRSA colonization surveillance program. It is not intended to diagnose MRSA infection nor to guide or monitor treatment for MRSA infections. Performed at [facility], [address]  [place], [place] [address]     Coagulation Studies: No results for input(s): LABPROT, INR in the last 72 hours.  Urinalysis: No results for input(s): COLORURINE, LABSPEC, PHURINE, GLUCOSEU, HGBUR, BILIRUBINUR, KETONESUR, PROTEINUR, UROBILINOGEN, NITRITE, LEUKOCYTESUR in the last 72 hours.  Invalid input(s): APPERANCEUR    Imaging: No results found.   Medications:   . sodium chloride Stopped (07/18/18 0113)  . piperacillin-tazobactam (ZOSYN)  IV Stopped (07/18/18 0045)  . vancomycin    . vancomycin Stopped (07/17/18 1305)   . amLODipine  10 mg Oral Daily  . aspirin EC  81 mg Oral Daily  . carvedilol  12.5 mg Oral BID WC  . cinacalcet  30 mg Oral BH-q7a  . epoetin (EPOGEN/PROCRIT) injection  10,000 Units Intravenous Q M,W,F-HD  . ferric citrate  210 mg Oral TID WC  . heparin  5,000 Units Subcutaneous Q8H  . hydrALAZINE  50 mg Oral Q8H  . insulin aspart  0-5 Units Subcutaneous QHS  . insulin aspart  0-9 Units Subcutaneous TID WC  . multivitamin  1 tablet Oral Daily   sodium chloride, acetaminophen **OR** acetaminophen, hydrALAZINE, labetalol, ondansetron **OR** ondansetron (ZOFRAN) IV, polyethylene glycol  Assessment/ Plan:  78 y.o. black male with end stage renal disease on hemodialysis,  hypertension, diabetes mellitus, GERD, anemia, SHPTH, colon cancer status post resection on 10/31 by Dr. [name], CLL followed by Dr. Janese Banks, aortitis   [facility] MWF L AVF 92kg  1.  ESRD on HD on MWF.  Seen and examined on hemodialysis treatment. Tolerating treatment well.  Continue MWF schedule.   2.  Anemia chronic kidney disease. Hemoglobin 8.3 -  Epogen 10,000 units IV with dialysis.  3.  Secondary hyperparathyroidism. - Auryxia with meals - Cinacalcet daily  4.  Hypertension. At goal.  - amlodipine, carvedilol, hydralazine.    5.  Aortitis:   Appreciate ID input. Currently on pip/tazo 3.375mg  bid  and vanco 1 gram with dialysis. End date of 09/11/18. Status post stent  placement by vascular surgery on 07/15/18.  Discussed case with Dr. Delaine Lame, ID - patient may be on lifelong antibiotics.    LOS: 10 Reshanda Lewey 1/2/20209:44 AM

## 2018-07-18 NOTE — Plan of Care (Signed)
  Problem: Clinical Measurements: Goal: Will remain free from infection Outcome: Progressing Note:  WBC count trending down 17.0 this morning. Still receiving IV abx   Problem: Activity: Goal: Risk for activity intolerance will decrease Outcome: Progressing Note:  Up to bathroom with standby assist, tolerating well   Problem: Coping: Goal: Level of anxiety will decrease Outcome: Progressing   Problem: Pain Managment: Goal: General experience of comfort will improve Outcome: Progressing Note:  No complaints of pain this shift   Problem: Safety: Goal: Ability to remain free from injury will improve Outcome: Progressing   Problem: Education: Goal: Knowledge of General Education information will improve Description Including pain rating scale, medication(s)/side effects and non-pharmacologic comfort measures Outcome: Completed/Met   Problem: Clinical Measurements: Goal: Respiratory complications will improve Outcome: Completed/Met   Problem: Nutrition: Goal: Adequate nutrition will be maintained Outcome: Completed/Met

## 2018-07-18 NOTE — Progress Notes (Signed)
Date of Admission:  07/08/2018      ID: Jonathan Mcgrath is a 78 y.o. 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 amLODipine  10 mg Oral Daily  . aspirin EC  81 mg Oral Daily  . carvedilol  12.5 mg Oral BID WC  . cinacalcet  30 mg Oral BH-q7a  . epoetin (EPOGEN/PROCRIT) injection  10,000 Units Intravenous Q M,W,F-HD  . ferric citrate  210 mg Oral TID WC  . heparin  5,000 Units Subcutaneous Q8H  . hydrALAZINE  50 mg Oral Q8H  . insulin aspart  0-5 Units Subcutaneous QHS  . insulin aspart  0-9 Units Subcutaneous TID WC  . multivitamin  1 tablet Oral Daily    Objective: Vital signs in last 24 hours: Temp:  [97.8 F (36.6 C)-98.4 F (36.9 C)] 98.2 F (36.8 C) (01/02 8182) Pulse Rate:  [68-69] 69 (01/02 0611) Resp:  [14-17] 16 (01/02 0611) BP: (124-149)/(49-62) 149/62 (01/02 0611) SpO2:  [91 %-95 %] 93 % (01/02 0611) Weight:  [92.6 kg] 92.6 kg (01/02 0630)  PHYSICAL EXAM:  General: Alert, cooperative, no distress, appears stated age.  Head: Normocephalic, without obvious abnormality, atraumatic. Eyes: Conjunctivae clear, anicteric sclerae. Pupils are equal ENT Nares normal. No drainage or sinus tenderness. Lips, mucosa, and tongue normal. No Thrush Neck: Supple, symmetrical, no adenopathy, thyroid: non tender no carotid bruit and no JVD. Rt sided hickman Back: No CVA tenderness. Lungs: Clear to auscultation bilaterally. No Wheezing or Rhonchi. No rales. Heart: Regular rate and rhythm, no murmur, rub or gallop. Abdomen: Soft, non-tender,not distended. Bowel sounds normal. No masses Extremities: atraumatic, no cyanosis. No edema. No clubbing Skin: No rashes or lesions. Or bruising Lymph: Cervical, supraclavicular normal. Neurologic: Grossly non-focal  Lab Results Recent Labs     07/16/18 0320 07/17/18 0409 07/17/18 0933 07/18/18 0523  WBC 20.8* 18.1*  --  17.0*  HGB 8.8* 8.3*  --  8.7*  HCT 27.5* 25.5*  --  26.9*  NA 139  --  140  --   K 3.7  --  3.6  --   CL 102  --  102  --   CO2 29  --  26  --   BUN 40*  --  53*  --   CREATININE 5.95*  --  7.90*  --    Liver Panel Recent Labs    07/17/18 0933  ALBUMIN 2.7*   Sedimentation Rate No results for input(s): ESRSEDRATE in the last 72 hours. C-Reactive Protein No results for input(s): CRP in the last 72 hours.  Microbiology:  Studies/Results: No results found.   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 ?Severe aortitis-progressing inspite of recent 8 weeks of IV antibiotics and then PO Augmentin. Thisisdue to clostridium septicum bacteremia diagnosed in Sept 2019 due to colon cancer.  C. septicum aortitis hasvery poor prognosis and without surgical intervention the mortality is high. As in this patient in spite of 8 weeks of appropriate IV antibiotics there is progression of the disease. He will need surgical intervention-but he is a high risk candidate.plan is for stenting after a few days  of IV antibiotic Started unasyn 12/25 as blood culture negativeand VAnco and zosyn were stopped. As leucocytosis worsening restarted vanco and zosyn on 07/15/18 and there is improvement. So he will go home on these 2 antibiotics- vanco during dialysis And zosyn he will administer at home   S/p stents placement in aorta, common iliac, rt and left iliac and external iliac on 07/15/18  Colon Cancer- s/p resection and primary anastomosis  ESRD on dialysis  HTN- management as per primary team  Discussed the management with this  daughter in great detail, spoke to the patient and the care team Will follow him as OP

## 2018-07-18 NOTE — Progress Notes (Signed)
Pt refused to stay to complete his abx infusion / received half of dose/ iv and tele removed/ hickmans cath left in place for discharge/ discharge instructions explained to pt and pts son/ verbalized an understanding/ RX given to pt/ transported off unit via wheelchair.

## 2018-07-18 NOTE — Discharge Summary (Signed)
Jonathan Mcgrath, 78 y.o., DOB Jonathan Mcgrath, MRN [id number]. 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  Patient was treated with IV antibiotics.  For progressive aortitis.  Patient also had C septicum.  Due to persistent infection she was seen by vascular surgery underwent endograft.  Patient tolerated the procedure she he will be continued on IV antibiotics with vancomycin and Zosyn.  This will be done at home IV antibiotic.            Consults  vascular surgery, infectious disease Significant Tests:  See full reports for all details    Dg Chest 2 View  Result Date: 06/19/2018 CLINICAL DATA:  Shortness of breath EXAM: CHEST - 2 VIEW COMPARISON:  06/16/2018 FINDINGS: Moderate bilateral effusions. There is hyperinflation of the lungs compatible with COPD. Cardiomegaly, vascular congestion. Mild interstitial prominence Jonathan reflect interstitial edema, improved since prior study. Bibasilar atelectasis. IMPRESSION: Cardiomegaly, vascular congestion and suspected mild residual interstitial edema, improved since prior study. Moderate bilateral effusions with bibasilar atelectasis.  Electronically Signed   By: Jonathan Mcgrath M.D.   On: 06/19/2018 19:46   Ct Chest W Contrast  Result Date: 07/08/2018 CLINICAL DATA:  Dialysis patient. Weakness and dizziness. Chest and abdominal pain. EXAM: CT CHEST, ABDOMEN, AND PELVIS WITH CONTRAST TECHNIQUE: Multidetector CT imaging of the chest, abdomen and pelvis was performed following the standard protocol during bolus administration of intravenous contrast. CONTRAST:  179mL ISOVUE-300 IOPAMIDOL (ISOVUE-300) INJECTION 61% COMPARISON:  Radiography same day. Chest CT 04/18/2018. abdominal CT 05/14/2018 FINDINGS: CT CHEST FINDINGS Cardiovascular: Cardiomegaly. Small pericardial effusion. Coronary artery calcification. Aortic valve calcification. Thoracic aortic atherosclerotic calcification. No aneurysm or dissection. Mediastinum/Nodes: No mass or adenopathy. Lungs/Pleura: Bilateral effusions layering dependently with dependent pulmonary atelectasis. Interstitial prominence likely reflecting interstitial edema. No non dependent pathology otherwise. Musculoskeletal: No acute thoracic or rib finding. Ordinary spinal degenerative change. CT ABDOMEN PELVIS FINDINGS Hepatobiliary: Liver parenchyma appears normal. No evidence of cholecystitis. Pancreas: Normal Spleen: Normal Adrenals/Urinary Tract: Adrenal glands are normal. Kidneys are poorly functioning. No evidence of obstruction or mass. Bladder shows wall thickening. Stomach/Bowel: Interval colectomy on the right. No complicating feature seen relative to that. No sign of bowel obstruction. Vascular/Lymphatic: Worsening of the appearance at the distal aorta, aortic bifurcation and left iliac region, with more surrounding extravascular density. Actual low-density material surrounding the enhancing vessels. As previously, this could be related to acute arteritis, possibly infectious, or to a contained perforation with persistent leakage. Reproductive: Normal except for an enlarged prostate. Other: No free fluid  or air. Musculoskeletal: Chronic degenerative changes of the spine, with chronic hemangioma within L2 and chronic endplate Schmorl's nodes. IMPRESSION: 1. Interval colectomy on the right. No complicating feature seen relative to that. 2. Worsening of the appearance at the distal aorta, aortic bifurcation and left iliac region. As previously,  this could be related to acute arteritis, possibly infectious, or to a contained perforation with persistent leakage. There is low-density adjacent to the vessel which could represent actual abscess. 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 Infection is the main concern in this case. Congestive heart failure/fluid overload with bilateral effusions, interstitial edema and dependent atelectasis. Electronically Signed   By: Jonathan Mcgrath M.D.   On: 07/08/2018 15:52   Ct Abdomen Pelvis W Contrast  Result Date: 07/08/2018 CLINICAL DATA:  Dialysis patient. Weakness and dizziness. Chest and abdominal pain. EXAM: CT CHEST, ABDOMEN, AND PELVIS WITH CONTRAST TECHNIQUE: Multidetector CT imaging of the chest, abdomen and pelvis was performed following the standard protocol during bolus administration of intravenous contrast. CONTRAST:  121mL ISOVUE-300 IOPAMIDOL (ISOVUE-300) INJECTION 61% COMPARISON:  Radiography same day. Chest CT 04/18/2018. abdominal CT 05/14/2018 FINDINGS: CT CHEST FINDINGS Cardiovascular: Cardiomegaly. Small pericardial effusion. Coronary artery calcification. Aortic valve calcification. Thoracic aortic atherosclerotic calcification. No aneurysm or dissection. Mediastinum/Nodes: No mass or adenopathy. Lungs/Pleura: Bilateral effusions layering dependently with dependent pulmonary atelectasis. Interstitial prominence likely reflecting interstitial edema. No non dependent pathology otherwise. Musculoskeletal: No acute thoracic or rib finding. Ordinary spinal  degenerative change. CT ABDOMEN PELVIS FINDINGS Hepatobiliary: Liver parenchyma appears normal. No evidence of cholecystitis. Pancreas: Normal Spleen: Normal Adrenals/Urinary Tract: Adrenal glands are normal. Kidneys are poorly functioning. No evidence of obstruction or mass. Bladder shows wall thickening. Stomach/Bowel: Interval colectomy on the right. No complicating feature seen relative to that. No sign of bowel obstruction. Vascular/Lymphatic: Worsening of the appearance at the distal aorta, aortic bifurcation and left iliac region, with more surrounding extravascular density. Actual low-density material surrounding the enhancing vessels. As previously, this could be related to acute arteritis, possibly infectious, or to a contained perforation with persistent leakage. Reproductive: Normal except for an enlarged prostate. Other: No free fluid or air. Musculoskeletal: Chronic degenerative changes of the spine, with chronic hemangioma within L2 and chronic endplate Schmorl's nodes. IMPRESSION: 1. Interval colectomy on the right. No complicating feature seen relative to that. 2. Worsening of the appearance at the distal aorta, aortic bifurcation and left iliac region. As previously, this could be related to acute arteritis, possibly infectious, or to a contained perforation with persistent leakage. There is low-density adjacent to the vessel which could represent actual abscess. 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 Infection is the main concern in this case. Congestive heart failure/fluid overload with bilateral effusions, interstitial edema and dependent atelectasis. Electronically Signed   By: Jonathan Mcgrath M.D.   On: 07/08/2018 15:52   Dg Chest Portable 1 View  Result Date: 07/08/2018 CLINICAL DATA:  Weakness and dizziness EXAM: PORTABLE CHEST 1 VIEW COMPARISON:  06/19/2018 FINDINGS: Moderate cardiomegaly  is unchanged. There is mild interstitial pulmonary edema. No pleural effusion or pneumothorax. IMPRESSION: Moderate cardiomegaly with mild interstitial pulmonary edema. Electronically Signed   By: Ulyses Jarred M.D.   On: 07/08/2018 13:41   Ct Angio Abd/pel W/ And/or W/o  Result Date: 07/10/2018 CLINICAL DATA:  Aortoiliac bifurcation infectious arteritis EXAM: CT ANGIOGRAPHY ABDOMEN AND PELVIS WITH CONTRAST AND WITHOUT CONTRAST TECHNIQUE: Multidetector CT imaging of the abdomen and pelvis was performed using the standard protocol during bolus administration of intravenous contrast. Multiplanar reconstructed images and MIPs were obtained and reviewed to evaluate the vascular anatomy. CONTRAST:  121mL ISOVUE-370 IOPAMIDOL (ISOVUE-370) INJECTION 76% COMPARISON:  04/15/2018, 05/14/2018, 07/08/2018 FINDINGS: VASCULAR Aorta: Atherosclerosis of the abdominal aorta. Negative for significant abdominal aneurysm or dissection. Distal aortic bifurcation surrounding strandy edema/inflammation, unchanged. No thrombus or occlusion. Celiac: Atherosclerotic origin with minor narrowing. No significant stenosis. Celiac remains patent including its branches SMA: Atherosclerotic origin but remains patent including its branches Renals: Minor atherosclerosis without significant stenosis by CTA. Main renal arteries remain patent. No accessory renal artery. IMA: Remains patent off the distal aorta including its branches Inflow: Left common iliac region perivascular strandy edema/inflammation persists with 2 adjacent areas of similar-sized pseudoaneurysm formation/contained perforations. There is similar perivascular ill-defined low-attenuation fluid measuring 2.5 x 2.0 cm suspicious for perivascular developing fluid collection or abscess in the setting of infectious arteritis. No significant interval change, developing dissection, thrombus, occlusion, or enlarging hematoma. Pelvic iliac vessels are atherosclerotic and tortuous but  remain patent. No inflow disease or occlusion. Proximal Outflow: Common, profunda femoral, and superficial femoral arteries demonstrated all remain patent. Veins: No definite associated veno-occlusive process. Review of the MIP images confirms the above findings. NON-VASCULAR Lower chest: Similar dependent small to moderate pleural effusions with compressive bibasilar atelectasis. Small pericardial effusion. Heart remains enlarged. Small hiatal hernia. Hepatobiliary: No focal liver abnormality is seen. No gallstones, gallbladder wall thickening, or biliary dilatation. Pancreas: Unremarkable. No pancreatic ductal dilatation or surrounding inflammatory changes. Spleen: Normal in size without focal abnormality. Adrenals/Urinary Tract: Normal adrenal glands. Renal vascular calcifications noted. Suspect left parapelvic hypodense renal cyst measuring 1.5 cm. No renal obstruction or hydronephrosis. No hydroureter. Bladder is thick-walled under distended. Stomach/Bowel: Negative for bowel obstruction, significant dilatation, ileus, free air. Remote right hemicolectomy. Lymphatic: Small para-aortic lymph nodes, suspect reactive. No bulky adenopathy. Reproductive: Prostate gland is enlarged. Seminal vesicles are symmetric. Other: Intact abdominal wall.  No large volume ascites. Musculoskeletal: Degenerative changes of the spine. Diffuse body anasarca. IMPRESSION: VASCULAR Similar appearance of the left common iliac vascular abnormality concerning for pseudoaneurysm formation/contained perforations with adjacent perivascular developing abscess measuring up to 2.5 cm. Appearance compatible with infectious arteritis, unchanged when compared to 07/08/2018. However, this process has progressed compared 05/14/2018. NON-VASCULAR Similar pleural effusions and basilar atelectasis. Small pericardial effusion. Small hiatal hernia Remote right hemicolectomy without bowel obstruction or perforation Nonspecific bladder wall thickening  Electronically Signed   By: Jerilynn Mages.  Shick M.D.   On: 07/10/2018 09:46       Today   Subjective:   Jonathan Mcgrath patient is doing well wants to go home Objective:   Blood pressure (!) 149/62, pulse 69, temperature 98.2 F (36.8 C), resp. rate 16, height 6\' 1"  (1.854 m), weight 92.6 kg, SpO2 93 %.  .  Intake/Output Summary (Last 24 hours) at 07/18/2018 1524 Last data filed at 07/18/2018 0306 Gross per 24 hour  Intake 71.39 ml  Output -  Net 71.39 ml    Exam VITAL SIGNS: Blood pressure (!) 149/62, pulse 69, temperature 98.2 F (36.8 C), resp. rate 16, height 6\' 1"  (1.854 m), weight 92.6 kg, SpO2 93 %.  GENERAL:  78 y.o.-year-old patient lying in the bed with no acute distress.  EYES: Pupils equal, round, reactive to light and accommodation. No scleral icterus. Extraocular muscles intact.  HEENT: Head atraumatic, normocephalic. Oropharynx and nasopharynx clear.  NECK:  Supple, no jugular venous distention. No thyroid enlargement, no tenderness.  LUNGS: Normal breath sounds bilaterally, no wheezing, rales,rhonchi or crepitation. No use of accessory muscles of respiration.  CARDIOVASCULAR: S1, S2 normal. No murmurs, rubs, or gallops.  ABDOMEN: Soft, nontender, nondistended. Bowel sounds present. No organomegaly  or mass.  EXTREMITIES: No pedal edema, cyanosis, or clubbing.  NEUROLOGIC: Cranial nerves II through XII are intact. Muscle strength 5/5 in all extremities. Sensation intact. Gait not checked.  PSYCHIATRIC: The patient is alert and oriented x 3.  SKIN: No obvious rash, lesion, or ulcer.   Data Review     CBC w Diff:  Lab Results  Component Value Date   WBC 17.0 (H) 07/18/2018   HGB 8.7 (L) 07/18/2018   HGB 10.6 (L) 04/12/2014   HCT 26.9 (L) 07/18/2018   HCT 33.5 (L) 04/12/2014   PLT 180 07/18/2018   PLT 123 (L) 04/12/2014   LYMPHOPCT 38 07/08/2018   LYMPHOPCT 63.9 04/11/2014   MONOPCT 6 07/08/2018   MONOPCT 4 04/12/2014   MONOPCT 6.3 04/11/2014   EOSPCT 0  07/08/2018   EOSPCT 0.8 04/11/2014   BASOPCT 0 07/08/2018   BASOPCT 0.8 04/11/2014   CMP:  Lab Results  Component Value Date   NA 140 07/17/2018   NA 140 04/13/2014   K 3.6 07/17/2018   K 4.6 04/13/2014   CL 102 07/17/2018   CL 109 (H) 04/13/2014   CO2 26 07/17/2018   CO2 24 04/13/2014   BUN 53 (H) 07/17/2018   BUN 79 (H) 04/13/2014   CREATININE 7.90 (H) 07/17/2018   CREATININE 9.83 (H) 04/13/2014   PROT 6.2 (L) 07/08/2018   PROT 6.4 04/11/2014   ALBUMIN 2.7 (L) 07/17/2018   ALBUMIN 3.2 (L) 04/11/2014   BILITOT 1.2 07/08/2018   BILITOT 0.6 04/11/2014   ALKPHOS 95 07/08/2018   ALKPHOS 91 04/11/2014   AST 23 07/08/2018   AST 39 (H) 04/11/2014   ALT 19 07/08/2018   ALT 50 04/11/2014  .  Micro Results Recent Results (from the past 240 hour(s))  CULTURE, BLOOD (ROUTINE X 2) w Reflex to ID Panel     Status: None   Collection Time: 07/08/18  8:49 PM  Result Value Ref Range Status   Specimen Description BLOOD BLOOD RIGHT HAND  Final   Special Requests   Final    BOTTLES DRAWN AEROBIC AND ANAEROBIC Blood Culture results Jonathan not be optimal due to an excessive volume of blood received in culture bottles   Culture   Final    NO GROWTH 5 DAYS Performed at [facility], [address] [place], [place] [address]    Report Status 07/13/2018 FINAL  Final  CULTURE, BLOOD (ROUTINE X 2) w Reflex to ID Panel     Status: None   Collection Time: 07/08/18  8:49 PM  Result Value Ref Range Status   Specimen Description BLOOD FINGER  Final   Special Requests   Final    BOTTLES DRAWN AEROBIC AND ANAEROBIC Blood Culture results Jonathan not be optimal due to an excessive volume of blood received in culture bottles   Culture   Final    NO GROWTH 5 DAYS Performed at [facility], [address] [place], [place] [address]    Report Status 07/13/2018 FINAL  Final  MRSA PCR Screening     Status: None   Collection Time: 07/09/18 12:33 AM  Result Value Ref Range Status    MRSA by PCR NEGATIVE NEGATIVE Final    Comment:        The GeneXpert MRSA Assay (FDA approved for NASAL specimens only), is one component of a comprehensive MRSA colonization surveillance program. It is not intended to diagnose MRSA infection nor to guide or monitor treatment for MRSA infections. Performed at  [facility], [address], [place], [place] [address]         Code Status Orders  (From admission, onward)         Start     Ordered   07/09/18 0003  Limited resuscitation (code)  Continuous    Question Answer Comment  In the event of cardiac or respiratory ARREST: Initiate Code Blue, Call Rapid Response Yes   In the event of cardiac or respiratory ARREST: Perform CPR Yes   In the event of cardiac or respiratory ARREST: Perform Intubation/Mechanical Ventilation No   In the event of cardiac or respiratory ARREST: Use NIPPV/BiPAp only if indicated Yes   In the event of cardiac or respiratory ARREST: Administer ACLS medications if indicated Yes   In the event of cardiac or respiratory ARREST: Perform Defibrillation or Cardioversion if indicated Yes      07/09/18 0002        Code Status History    Date Active Date Inactive Code Status Order ID Comments User Context   06/16/2018 1650 06/20/2018 1737 DNR [id number]  Fritzi Mandes, MD ED   05/16/2018 1845 05/20/2018 2324 DNR [id number]  Benjamine Sprague, DO Inpatient   04/12/2018 1310 04/20/2018 1541 DNR [id number]  Dustin Flock, MD Inpatient   04/12/2018 0827 04/12/2018 1310 DNR [id number]  Dustin Flock, MD ED   08/17/2016 0352 08/18/2016 2236 Full Code [id number]  Saundra Shelling, MD Inpatient   01/12/2016 1417 01/12/2016 1754 Full Code [id number]  Algernon Huxley, MD Inpatient    Advance Directive Documentation     Most Recent Value  Type of Advance Directive  Healthcare Power of Attorney, Living will  Pre-existing out of facility DNR order (yellow form or pink MOST form)  -  "MOST" Form in Place?  -           Follow-up Information    Kris Hartmann, NP On 08/16/2018.   Specialty:  Vascular Surgery Why:  aortoiliac duplex, Appointment Time: @ 10:00 Contact information: [place] [address] [address], [facility] On 07/24/2018.   Why:  Appointment Time: @ 10:20 Contact information: [place] [address] [phone]           Discharge Medications   Allergies as of 07/18/2018   No Known Allergies     Medication List    TAKE these medications   amLODipine 5 MG tablet Commonly known as:  NORVASC Take 1 tablet (5 mg total) by mouth daily. What changed:  how much to take   aspirin 81 MG EC tablet Take 1 tablet (81 mg total) by mouth daily.   carvedilol 6.25 MG tablet Commonly known as:  COREG Take 6.25 mg by mouth 2 (two) times daily with a meal.   cinacalcet 30 MG tablet Commonly known as:  SENSIPAR Take 30 mg by mouth every morning.   ferric citrate 1 GM 210 MG(Fe) tablet Commonly known as:  AURYXIA Take 210 mg by mouth daily.   multivitamin Tabs tablet Take 1 tablet by mouth daily.   ondansetron 4 MG tablet Commonly known as:  ZOFRAN Take 4 mg by mouth 3 (three) times daily as needed for nausea/vomiting.   piperacillin-tazobactam  IVPB Commonly known as:  ZOSYN Inject 3.375 g into the vein every 12 (twelve) hours. Indication:  aortitis Last Day of Therapy:  09/11/18 Labs - Once weekly:  CBC/D, CRP and CMP Please leave PICC in place until doctor  has seen patient or been notified   vancomycin  IVPB Inject 1,000 mg into the vein every Monday, Wednesday, and Friday with hemodialysis. Indication:  aortitis Last Day of Therapy:  09/11/18 Labs - Once weekly:  CBC/D, CRP, vancomycin level and CMP Please leave PICC in place until doctor has seen patient or been notified Start taking on:  July 19, 2018            Home Infusion Instuctions  (From admission, onward)         Start      Ordered   07/18/18 0000  Home infusion instructions [place] Jonathan follow [place] Dosing Protocol; Jonathan administer Cathflo as needed to maintain patency of vascular access device.; Flushing of vascular access device: per [facility] Protocol: 0.9% NaCl pre/post medica...    Question Answer Comment  Instructions Jonathan follow [place] Dosing Protocol   Instructions Jonathan administer Cathflo as needed to maintain patency of vascular access device.   Instructions Flushing of vascular access device: per [facility] Protocol: 0.9% NaCl pre/post medication administration and prn patency; Heparin 100 u/ml, 13ml for implanted ports and Heparin 10u/ml, 54ml for all other central venous catheters.   Instructions Jonathan follow [facility] Anaphylaxis Protocol for First Dose Administration in the home: 0.9% NaCl at 25-50 ml/hr to maintain IV access for protocol meds. Epinephrine 0.3 ml IV/IM PRN and Benadryl 25-50 IV/IM PRN s/s of anaphylaxis.   Instructions [facility] Infusion Coordinator (RN) to assist per patient IV care needs in the home PRN.      07/18/18 0955             Total Time in preparing paper work, data evaluation and todays exam - 69 minutes  Dustin Flock M.D on 07/18/2018 at [place]  [phone]

## 2018-07-18 NOTE — Care Management (Signed)
RNCM order placed to set up outpatient palliative.  Notified Santiago Glad, RN with [facility]/outpatient palliative.

## 2018-07-18 NOTE — Progress Notes (Addendum)
Unable to flush r chest hickmans cath / Dr. Posey Pronto made aware/ iv consult ordered to assess for potential problem / if iv team is unable to solve issue/ will make vascular aware.    IV nurse fixed issue - Hickmans cath able to be flushed and + blood return

## 2018-07-18 NOTE — Care Management Note (Signed)
Case Management Note  Patient Details  Name: WANDA CELLUCCI MRN: [id number] Date of Birth: 1940/12/10  Subjective/Objective:      Spoke with Ileene Hutchinson, daughter [phone].  Patient will receive Zosyn dose at 6pm tonight and discharge after.  She states one of his brothers will pick him up.  [facility] is set up to come out first thing tomorrow morning to administer first home dose of IV Zosyn.                Action/Plan:   Expected Discharge Date:                  Expected Discharge Plan:  [place]  In-House Referral:     Discharge planning Services  CM Consult  Post Acute Care Choice:  Home Health Choice offered to:  Patient  DME Arranged:    DME Agency:     HH Arranged:  RN, IV Antibiotics, PT HH Agency:  [facility]  Status of Service:  Completed, signed off  If discussed at [place] of Stay Meetings, dates discussed:    Additional Comments:  Elza Rafter, RN 07/18/2018, 9:43 AM

## 2018-07-19 NOTE — Treatment Plan (Addendum)
Diagnosis: Aortitis Baseline Creatinine ESRD   No Known Allergies  OPAT Orders Discharge antibiotics: Vancomycin 1 gram during dilaysis in the center 09/11/2018  Unasyn 3 grms IV q 12 ( instead of zosyn) 09/11/18 Central line  Care Per Protocol:  Labs weekly while on IV antibiotics: _X_ CBC with differential  _X_ CMP X CRP  _X_ Vancomycin level ( done at the dialysis center)   __ Please leave central line  in place until doctor has seen patient or been notified  Fax weekly labs to [phone]  Clinic Follow Up Appt:3 weeks   Call [phone] to make appt

## 2018-07-19 NOTE — Care Management Important Message (Signed)
Copy of signed Medicare IM left with patient in room. 

## 2018-07-23 ENCOUNTER — Telehealth: Payer: Self-pay | Admitting: Infectious Diseases

## 2018-07-23 NOTE — Telephone Encounter (Signed)
Patient's daughter calling for 2nd time. She has a question about her father's medicine.  Requesting a call back after 3pm due to an appt that she is going too

## 2018-07-24 ENCOUNTER — Telehealth: Payer: Self-pay | Admitting: Licensed Clinical Social Worker

## 2018-07-24 NOTE — Telephone Encounter (Signed)
Spoke with Jeani Hawking and he states that changing to [place] will not help because he will need to be every 12 hours due to his kidney function. Asked about going every 24 hours and he did not suggest that as an alternative. He suggested that the daughter prepare the first dose and spike the bag for the second dose and it can be refrigerated for him later on that night. Otherwise he could hang the bang on a 24 hour pump which would be difficult for him to operate. Please advise, if changes still need to be made to [facility].

## 2018-07-24 NOTE — Telephone Encounter (Signed)
Patient's sister called with questions about his current medication and questions about where the infections was actually located. She has concerns about it being in "the lining of his heart" since she keeps hearing the word Aorta. She also states that her dad lives alone now and is not giving the IV antibiotics at correct times and states that "he is tired of doing this"  Daughter would like to know if there is an alternative medication or can he be sent to assisted living while on IV medications.

## 2018-07-25 NOTE — Telephone Encounter (Signed)
Spoke with daughter and she expressed understanding

## 2018-07-29 ENCOUNTER — Telehealth: Payer: Self-pay

## 2018-07-29 NOTE — Telephone Encounter (Signed)
Flagged on EMMI report for not having a follow up scheduled.  First attempt to reach patient made, however unable to reach or leave message.  Will attempt at a later time.

## 2018-07-30 NOTE — Telephone Encounter (Signed)
Second attempt made, however unable to reach or leave message.  No further attempts to reach at this time.

## 2018-08-01 ENCOUNTER — Inpatient Hospital Stay: Payer: Medicare HMO | Admitting: Oncology

## 2018-08-01 ENCOUNTER — Inpatient Hospital Stay: Payer: Medicare HMO | Attending: Oncology

## 2018-08-05 ENCOUNTER — Telehealth: Payer: Self-pay | Admitting: Licensed Clinical Social Worker

## 2018-08-05 NOTE — Telephone Encounter (Signed)
I spoke to the daughter and son .Explained to them about his condition and prognosis (poor) . Told them about his IV antibiotics ( one during dialysis and one at home).Told them that his PCP should be the one taking control of palliative care or hospice and I am ID and dont do those papers but can talk to his PCP if needed. Told them that he should also follow up with Vascular/oncology/surgery

## 2018-08-05 NOTE — Telephone Encounter (Signed)
Called patient's daughter to check on him, patient was unreachable due to being at dialysis. Daughter states that patient is very weak, "bumping in to walls, tearful about not feeling well. Daughter states that he is not eating well. Daughter states that she received some papers about palliative care but did not know who to contact. I spoke with Dannette Barbara in the social work department and the patient was referred from the hospital to outpatient palliative care with  [facility]. I will call and give the daughter the number so she can talk to them.

## 2018-08-06 ENCOUNTER — Telehealth (INDEPENDENT_AMBULATORY_CARE_PROVIDER_SITE_OTHER): Payer: Self-pay

## 2018-08-06 NOTE — Telephone Encounter (Signed)
Patient's daughter called wanting answers to why her father's appt's have been canceled and why he is listed as palliative care. I explained that something like that doesn't usually come from [place] that is usually from the patient's PCP. I advised that she contact the patient's PCP, Nephrologist and homehealth and discuss this and get the needed answers to her questions.

## 2018-08-07 ENCOUNTER — Telehealth: Payer: Self-pay

## 2018-08-07 NOTE — Telephone Encounter (Signed)
Received call from Ileene [place], patient's daughter, requesting to speak about palliative referral made for patient during recent hospital visit.  Confirmed that palliative referral had been placed to [place] by Geoffry Paradise, RNCM.  Encouraged her to follow up with [facility] for any questions concerning services and what to expect and gave her the main number to call.

## 2018-08-08 ENCOUNTER — Ambulatory Visit: Payer: Medicare HMO | Admitting: Infectious Diseases

## 2018-08-09 ENCOUNTER — Telehealth: Payer: Self-pay | Admitting: Infectious Diseases

## 2018-08-09 NOTE — Telephone Encounter (Signed)
Called patient to check on him . plt 99/92. He has no bleeding, doing pretty well, Taking his meds regularly IV unasyn at home and IV vanco in the dialysis center.will keep an eye on the platelet -will check with Renal to see whether he is getting heparin flushes at dialysis or could be vanco. Will monitor.

## 2018-08-13 ENCOUNTER — Telehealth (INDEPENDENT_AMBULATORY_CARE_PROVIDER_SITE_OTHER): Payer: Self-pay | Admitting: Vascular Surgery

## 2018-08-13 ENCOUNTER — Ambulatory Visit (INDEPENDENT_AMBULATORY_CARE_PROVIDER_SITE_OTHER): Payer: Medicare HMO | Admitting: Nurse Practitioner

## 2018-08-13 NOTE — Telephone Encounter (Signed)
The patient's daughter called this AM asking if the patient could be seen however was unsure as to what the patient needed to be evaluated for.  We have seen the patient in consult for aortitis at [facility].  We have also removed a PermCath and performed a fistulogram on his dialysis access.  We DO NOT manage his oncology, infectious disease, or primary care issues.  We would be happy to bring him in and have him undergo an aortic duplex to rule out any aneurysm that can form due to his aortitis and an HDA to follow the recent work done on his dialysis access.  Any other issues that the patient may be having would have to go to the appropriate specialist.

## 2018-08-15 ENCOUNTER — Ambulatory Visit (INDEPENDENT_AMBULATORY_CARE_PROVIDER_SITE_OTHER): Payer: Medicare HMO | Admitting: Nurse Practitioner

## 2018-08-16 ENCOUNTER — Ambulatory Visit (INDEPENDENT_AMBULATORY_CARE_PROVIDER_SITE_OTHER): Payer: Medicare HMO | Admitting: Nurse Practitioner

## 2018-08-20 ENCOUNTER — Ambulatory Visit (INDEPENDENT_AMBULATORY_CARE_PROVIDER_SITE_OTHER): Payer: Medicare HMO | Admitting: Nurse Practitioner

## 2018-08-20 ENCOUNTER — Encounter (INDEPENDENT_AMBULATORY_CARE_PROVIDER_SITE_OTHER): Payer: Self-pay | Admitting: Nurse Practitioner

## 2018-08-20 VITALS — BP 172/72 | HR 67 | Resp 16 | Ht 73.0 in | Wt 205.2 lb

## 2018-08-20 DIAGNOSIS — I120 Hypertensive chronic kidney disease with stage 5 chronic kidney disease or end stage renal disease: Secondary | ICD-10-CM | POA: Diagnosis not present

## 2018-08-20 DIAGNOSIS — I776 Arteritis, unspecified: Secondary | ICD-10-CM

## 2018-08-20 DIAGNOSIS — Z992 Dependence on renal dialysis: Secondary | ICD-10-CM

## 2018-08-20 DIAGNOSIS — I10 Essential (primary) hypertension: Secondary | ICD-10-CM

## 2018-08-20 DIAGNOSIS — N186 End stage renal disease: Secondary | ICD-10-CM | POA: Diagnosis not present

## 2018-08-20 NOTE — Progress Notes (Signed)
Subjective:    Patient ID: Jonathan Mcgrath, male    DOB: 04/07/1941, 78 y.o.   MRN: [id number] Chief Complaint  Patient presents with  . Follow-up    HPI  Jonathan SEMINARA is a 78 y.o. 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  Patient had some soreness following the procedure however at this time he has no issues.  He denies any fever, chills, nausea, vomiting or diarrhea.  The patient states that he should be finishing up his antibiotic therapy at the end of this month.  Patient also states that he was given a good prognosis with his cancer diagnosis.  Overall the patient is feeling well and has no complaints.  Patient denies any chest pain or shortness of breath.  He denies any claudication-like symptoms.  He denies any TIA-like symptoms or amaurosis fugax.  Past Medical History:  Diagnosis Date  . Cancer ([place]) 2019   colon  . Chronic kidney disease   . Diabetes mellitus without complication (HCC)    diet controlled  . Hemodialysis access site with arteriovenous graft ([place])   . Hypertension   . Renal insufficiency    Pt on dialysis x 3 years and receives every Monday, Wednesday and Friday.     Past Surgical History:  Procedure Laterality Date  . AV FISTULA PLACEMENT Left 2015   arm  . CATARACT EXTRACTION W/ INTRAOCULAR LENS  IMPLANT, BILATERAL Bilateral 08/2017  . CENTRAL LINE INSERTION-TUNNELED N/A 04/19/2018   Procedure: CENTRAL LINE INSERTION-TUNNELED;  Surgeon: Delana Meyer Dolores Lory, MD;  Location: [place] [facility];  Service: Cardiovascular;  Laterality: N/A;  . CENTRAL VENOUS CATHETER INSERTION  07/15/2018   Procedure: Insertion Hickman Catheter;  Surgeon: Algernon Huxley, MD;  Location: [place] [facility];  Service: Cardiovascular;;  . COLON RESECTION N/A 05/16/2018   Procedure: LAPAROSCOPIC  COLON RESECTION;  Surgeon: Herbert Pun, MD;  Location: [facility];  Service: General;  Laterality:  N/A;  . COLONOSCOPY N/A 04/18/2018   Procedure: COLONOSCOPY;  Surgeon: Virgel Manifold, MD;  Location: [facility] ENDOSCOPY;  Service: Endoscopy;  Laterality: N/A;  . DIALYSIS/PERMA CATHETER REMOVAL N/A 06/19/2018   Procedure: DIALYSIS/PERMA CATHETER REMOVAL;  Surgeon: Algernon Huxley, MD;  Location: [place] [facility];  Service: Cardiovascular;  Laterality: N/A;  . ESOPHAGOGASTRODUODENOSCOPY N/A 04/18/2018   Procedure: ESOPHAGOGASTRODUODENOSCOPY (EGD);  Surgeon: Virgel Manifold, MD;  Location: [facility] END[facility]COPY;  Service: Endoscopy;  Laterality: N/A;  . EYE SURGERY    . PELVIC ANGIOGRAPHY  07/15/2018   Procedure: PELVIC ANGIOGRAPHY;  Surgeon: Katha Cabal, MD;  Location: [place] [facility];  Service: Cardiovascular;;  . PERIPHERAL VASCULAR BALLOON ANGIOPLASTY Bilateral 07/15/2018   Procedure: PERIPHERAL VASCULAR BALLOON ANGIOPLASTY;  Surgeon: Algernon Huxley, MD;  Location: [place] [facility];  Service: Cardiovascular;  Laterality: Bilateral;  . PERIPHERAL VASCULAR CATHETERIZATION Left 01/12/2016   Procedure: A/V Shuntogram/Fistulagram;  Surgeon: Algernon Huxley, MD;  Location: [place] [facility];  Service: Cardiovascular;  Laterality: Left;  . PERIPHERAL VASCULAR CATHETERIZATION N/A 01/12/2016   Procedure: A/V Shunt Intervention;  Surgeon: Algernon Huxley, MD;  Location: [place] [facility];  Service: Cardiovascular;  Laterality: N/A;  . PERITONEAL CATHETER INSERTION N/A   . REMOVAL OF A DIALYSIS CATHETER N/A 01/14/2015   Procedure: REMOVAL OF A  PERITONEAL DIALYSIS CATHETER;  Surgeon: Algernon Huxley, MD;  Location: [facility];  Service: Vascular;  Laterality: N/A;  . TEE WITHOUT CARDIOVERSION  N/A 04/19/2018   Procedure: TRANSESOPHAGEAL ECHOCARDIOGRAM (TEE);  Surgeon: Teodoro Spray, MD;  Location: [facility];  Service: Cardiovascular;  Laterality: N/A;    Social History   Socioeconomic History  . Marital status: Widowed    Spouse name: Not on file  . Number of children: 4  . Years of education:  Not on file  . Highest education level: Not on file  Occupational History  . Occupation: retired  Scientific laboratory technician  . Financial resource strain: Not on file  . Food insecurity:    Worry: Not on file    Inability: Not on file  . Transportation needs:    Medical: Not on file    Non-medical: Not on file  Tobacco Use  . Smoking status: Never Smoker  . Smokeless tobacco: Never Used  Substance and Sexual Activity  . Alcohol use: No  . Drug use: No  . Sexual activity: Not on file  Lifestyle  . Physical activity:    Days per week: Not on file    Minutes per session: Not on file  . Stress: Not on file  Relationships  . Social connections:    Talks on phone: Not on file    Gets together: Not on file    Attends religious service: Not on file    Active member of club or organization: Not on file    Attends meetings of clubs or organizations: Not on file    Relationship status: Not on file  . Intimate partner violence:    Fear of current or ex partner: Not on file    Emotionally abused: Not on file    Physically abused: Not on file    Forced sexual activity: Not on file  Other Topics Concern  . Not on file  Social History Narrative  . Not on file    Family History  Problem Relation Age of Onset  . Diabetes Mother   . Heart disease Father     No Known Allergies   Review of Systems   Review of Systems: Negative Unless Checked Constitutional: [] Weight loss  [] Fever  [] Chills Cardiac: [] Chest pain   []  Atrial Fibrillation  [] Palpitations   [] Shortness of breath when laying flat   [] Shortness of breath with exertion. [] Shortness of breath at rest Vascular:  [] Pain in legs with walking   [] Pain in legs with standing [] Pain in legs when laying flat   [] Claudication    [] Pain in feet when laying flat    [] History of DVT   [] Phlebitis   [] Swelling in legs   [] Varicose veins   [] Non-healing ulcers Pulmonary:   [] Uses home oxygen   [] Productive cough   [] Hemoptysis   [] Wheeze  [] COPD    [] Asthma Neurologic:  [] Dizziness   [] Seizures  [] Blackouts [] History of stroke   [] History of TIA  [] Aphasia   [] Temporary Blindness   [] Weakness or numbness in arm   [x] Weakness or numbness in leg Musculoskeletal:   [] Joint swelling   [] Joint pain   [] Low back pain  []  History of Knee Replacement [] Arthritis [] back Surgeries  []  Spinal Stenosis    Hematologic:  [] Easy bruising  [] Easy bleeding   [] Hypercoagulable state   [x] Anemic Gastrointestinal:  [] Diarrhea   [] Vomiting  [] Gastroesophageal reflux/heartburn   [] Difficulty swallowing. [] Abdominal pain Genitourinary:  [x] Chronic kidney disease   [] Difficult urination  [] Anuric   [] Blood in urine [] Frequent urination  [] Burning with urination   [] Hematuria Skin:  [] Rashes   [] Ulcers [] Wounds Psychological:  [] History of anxiety   []   History of major depression  []  Memory Difficulties     Objective:   Physical Exam  BP (!) 172/72 (BP Location: Right Arm, Patient Position: Sitting, Cuff Size: Normal)   Pulse 67   Resp 16   Ht 6\' 1"  (1.854 m)   Wt 205 lb 3.2 oz (93.1 kg)   BMI 27.07 kg/m   Gen: WD/WN, NAD Head: [place]/AT, No temporalis wasting.  Ear/Nose/Throat: Hearing grossly intact, nares w/o erythema or drainage Eyes: PER, EOMI, sclera nonicteric.  Neck: Supple, no masses.  No JVD.  Pulmonary:  Good air movement, no use of accessory muscles.  Cardiac: RRR Vascular:  Vessel Right Left  Radial Palpable Palpable  Dorsalis Pedis Palpable Palpable  Posterior Tibial Palpable Palpable   Gastrointestinal: soft, non-distended. No guarding/no peritoneal signs.  Musculoskeletal: M/S 5/5 throughout.  No deformity or atrophy.  Neurologic: Pain and light touch intact in extremities.  Symmetrical.  Speech is fluent. Motor exam as listed above. Psychiatric: Judgment intact, Mood & affect appropriate for pt's clinical situation. Dermatologic: No Venous rashes. No Ulcers Noted.  No changes consistent with cellulitis. Lymph : No Cervical  lymphadenopathy, no lichenification or skin changes of chronic lymphedema.      Assessment & Plan:   1. 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   No further intervention is required at this time.    The patient will continue antiplatelet therapy as prescribed as well as aggressive management of hyperlipidemia. Exercise is again strongly encouraged.   However, endografts require continued surveillance with ultrasound or CT scan. This is mandatory to detect any changes that allow repressurization of the aneurysm sac.  The patient is informed that this would be asymptomatic.  The patient is reminded that lifelong routine surveillance is a necessity with an endograft. Patient will continue to follow-up at 6 month intervals with ultrasound of the aorta. - VAS [place] EVAR DUPLEX; Future  2. ESRD on peritoneal dialysis [facility]) Patient currently unsure if he has ever had his fistula evaluated.  From what he states he may have had a fistulogram some time ago but has not been evaluated since.  However at this time it has a good thrill and bruit.  We will evaluate at his next visit. - VAS US DUPLEX DIALYSIS ACCESS (AVF, AVG); Future  3. Essential hypertension Continue antihypertensive medications as already ordered, these medications have been reviewed and there are no changes at this time.    Current Outpatient Medications on File Prior to Visit  Medication Sig Dispense Refill  . amLODipine (NORVASC) 5 MG tablet Take 1 tablet (5 mg total) by mouth daily. (Patient taking differently: Take 10 mg by mouth daily. ) 30 tablet 0  . aspirin EC 81 MG EC tablet Take 1 tablet (81 mg total) by mouth daily.    . carvedilol (COREG) 6.25 MG tablet Take 6.25 mg by mouth 2 (two) times daily with a meal.    . ferric citrate (AURYXIA) 1 GM 210 MG(Fe) tablet Take 210 mg by mouth daily.     . multivitamin (RENA-VIT) TABS tablet Take 1 tablet by mouth daily.  11  .  ondansetron (ZOFRAN) 4 MG tablet Take 4 mg by mouth 3 (three) times daily as needed for nausea/vomiting.    . piperacillin-tazobactam (ZOSYN) IVPB Inject 3.375 g into the vein every 12 (twelve) hours. Indication:  aortitis Last Day of Therapy:  09/11/18 Labs - Once weekly:  CBC/D, CRP and CMP Please leave PICC in place  until doctor has seen patient or been notified 113 Units 0  . vancomycin IVPB Inject 1,000 mg into the vein every Monday, Wednesday, and Friday with hemodialysis. Indication:  aortitis Last Day of Therapy:  09/11/18 Labs - Once weekly:  CBC/D, CRP, vancomycin level and CMP Please leave PICC in place until doctor has seen patient or been notified 55 Units 0  . cinacalcet (SENSIPAR) 30 MG tablet Take 30 mg by mouth every morning.     No current facility-administered medications on file prior to visit.     There are no Patient Instructions on file for this visit. No follow-ups on file.   Kris Hartmann, NP  This note was completed with Sales executive.  Any errors are purely unintentional.

## 2018-08-27 ENCOUNTER — Ambulatory Visit: Payer: Medicare HMO | Admitting: Infectious Diseases

## 2018-08-27 ENCOUNTER — Ambulatory Visit: Payer: Medicare HMO | Attending: Infectious Diseases | Admitting: Infectious Diseases

## 2018-08-27 ENCOUNTER — Encounter: Payer: Self-pay | Admitting: Infectious Diseases

## 2018-08-27 VITALS — BP 195/96 | HR 104 | Wt 202.0 lb

## 2018-08-27 DIAGNOSIS — N186 End stage renal disease: Secondary | ICD-10-CM

## 2018-08-27 DIAGNOSIS — C189 Malignant neoplasm of colon, unspecified: Secondary | ICD-10-CM

## 2018-08-27 DIAGNOSIS — I776 Arteritis, unspecified: Secondary | ICD-10-CM | POA: Diagnosis not present

## 2018-08-27 DIAGNOSIS — I729 Aneurysm of unspecified site: Secondary | ICD-10-CM | POA: Diagnosis not present

## 2018-08-27 DIAGNOSIS — Z9049 Acquired absence of other specified parts of digestive tract: Secondary | ICD-10-CM

## 2018-08-27 DIAGNOSIS — R011 Cardiac murmur, unspecified: Secondary | ICD-10-CM

## 2018-08-27 DIAGNOSIS — Z792 Long term (current) use of antibiotics: Secondary | ICD-10-CM

## 2018-08-27 DIAGNOSIS — I120 Hypertensive chronic kidney disease with stage 5 chronic kidney disease or end stage renal disease: Secondary | ICD-10-CM

## 2018-08-27 DIAGNOSIS — R7881 Bacteremia: Secondary | ICD-10-CM

## 2018-08-27 DIAGNOSIS — B9689 Other specified bacterial agents as the cause of diseases classified elsewhere: Secondary | ICD-10-CM

## 2018-08-27 DIAGNOSIS — K6389 Other specified diseases of intestine: Secondary | ICD-10-CM

## 2018-08-27 DIAGNOSIS — Z7982 Long term (current) use of aspirin: Secondary | ICD-10-CM

## 2018-08-27 DIAGNOSIS — Z95828 Presence of other vascular implants and grafts: Secondary | ICD-10-CM

## 2018-08-27 DIAGNOSIS — Z992 Dependence on renal dialysis: Secondary | ICD-10-CM

## 2018-08-27 DIAGNOSIS — E1122 Type 2 diabetes mellitus with diabetic chronic kidney disease: Secondary | ICD-10-CM

## 2018-08-27 DIAGNOSIS — A480 Gas gangrene: Secondary | ICD-10-CM

## 2018-08-27 DIAGNOSIS — Z95820 Peripheral vascular angioplasty status with implants and grafts: Secondary | ICD-10-CM

## 2018-08-27 DIAGNOSIS — Z79899 Other long term (current) drug therapy: Secondary | ICD-10-CM

## 2018-08-27 HISTORY — DX: Gas gangrene: A48.0

## 2018-08-27 NOTE — Progress Notes (Signed)
NAME: Jonathan Mcgrath  DOB: 04-29-1941  MRN: [id number]  Date/Time: 08/27/2018 12:58 PM  ?Follow-up visit. Jonathan Mcgrath is a 78 y.o. 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  Patient has had a complicated medical history since September 2019 when he first presented to the hospital with diarrhea, abdominal pain and fever and was diagnosed with clostridium septicum bacteremia. CTA of the abdomen showed Circumferential arterial wall thickening beginning at the aortic bifurcation and extending along the left greater than right common iliac arteries. There were associated inflammatory changes with inflammatory stranding in the immediately adjacent retroperitoneal fat and left pelvic sidewall. 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  He was seen by vascular surgery who recommended conservative management with IV antibiotics.  He was sent home on IV Unasyn/Zosyn to receive 6 weeks of therapy .  He had a repeat CT Angio of the abdomen and pelvis on 05/14/2018 in preparation for colectomy.  That was reported as Two contained 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 No contrast extravasation.2. Persistent though reduced perivascular stranding surrounding the distal abdominal aorta and left common iliac artery .  He underwent laparoscopic right extended hemicolectomy with primary anastomosis on 05/16/2018 by Dr. [name].  He was continued on IV Unasyn/zosyn  until 06/13/2018.  Power line  was removed on 06/19/2018.  The plan was for him to be on oral Augmentin.  He was readmitted to the hospital in December with bilateral flank pain  weakness and dizziness.  CT angio of the abdomen and pelvis revealed left common iliac vascular abnormality concerning for pseudoaneurysm formation/contained perforation with adjacent perivascular developing abscess measuring up to 2.5 cm.  He was initially treated with high dose of IV steroids and also was restarted on IV antibiotics.  As he was a high risk candidate for resection and bypass of the infected aorta   he underwent on 07/15/2018 coil embolization of the left internal iliac artery, stent placement right common iliac artery, left common iliac artery and left external iliac artery by Dr. Lucky Cowboy and Dr. Franchot Gallo.  He was discharged on 07/18/2018 on IV Unasyn and IV vancomycin.  The latter was given during dialysis.  This was to continue until September 11, 2018 Today he is here to see me as a follow-up. He is feeling better. He does not have any abdominal pain. He is a little unsteady on his feet. He lives on his own. He is giving himself IV Unasyn every day at home. Past Medical History:  Diagnosis Date  . Cancer ([place]) 2019   colon  . Chronic kidney disease   . Diabetes mellitus without complication (HCC)    diet controlled  . Hemodialysis access site with arteriovenous graft ([place])   . Hypertension   . Renal insufficiency    Pt on dialysis x 3 years and receives every Monday, Wednesday and Friday.     Past Surgical History:  Procedure Laterality Date  . AV FISTULA PLACEMENT Left 2015   arm  . CATARACT EXTRACTION W/ INTRAOCULAR LENS  IMPLANT, BILATERAL Bilateral 08/2017  . CENTRAL LINE INSERTION-TUNNELED N/A 04/19/2018   Procedure: CENTRAL LINE INSERTION-TUNNELED;  Surgeon: Delana Meyer Dolores Lory, MD;  Location: [facility];  Service: Cardiovascular;  Laterality: N/A;  . CENTRAL VENOUS CATHETER INSERTION  07/15/2018   Procedure: Insertion Hickman Catheter;  Surgeon: Algernon Huxley, MD;  Location: [facility];  Service: Cardiovascular;;  . COLON RESECTION N/A 05/16/2018    Procedure: LAPAROSCOPIC  COLON RESECTION;  Surgeon: Herbert Pun, MD;  Location: [facility];  Service: General;  Laterality: N/A;  . COLONOSCOPY N/A 04/18/2018   Procedure: COLONOSCOPY;  Surgeon: Virgel Manifold, MD;  Location: [facility];  Service: Endoscopy;  Laterality: N/A;  . DIALYSIS/PERMA CATHETER REMOVAL N/A 06/19/2018   Procedure: DIALYSIS/PERMA CATHETER REMOVAL;  Surgeon: Algernon Huxley, MD;  Location: [facility];  Service: Cardiovascular;  Laterality: N/A;  . ESOPHAGOGASTRODUODENOSCOPY N/A 04/18/2018   Procedure: ESOPHAGOGASTRODUODENOSCOPY (EGD);  Surgeon: Virgel Manifold, MD;  Location: [facility] END[facility];  Service: Endoscopy;  Laterality: N/A;  . EYE SURGERY    . PELVIC ANGIOGRAPHY  07/15/2018   Procedure: PELVIC ANGIOGRAPHY;  Surgeon: Katha Cabal, MD;  Location: [facility];  Service: Cardiovascular;;  . PERIPHERAL VASCULAR BALLOON ANGIOPLASTY Bilateral 07/15/2018   Procedure: PERIPHERAL VASCULAR BALLOON ANGIOPLASTY;  Surgeon: Algernon Huxley, MD;  Location: [facility];  Service: Cardiovascular;  Laterality: Bilateral;  . PERIPHERAL VASCULAR CATHETERIZATION Left 01/12/2016   Procedure: A/V Shuntogram/Fistulagram;  Surgeon: Algernon Huxley, MD;  Location: [facility];  Service: Cardiovascular;  Laterality: Left;  . PERIPHERAL VASCULAR CATHETERIZATION N/A 01/12/2016   Procedure: A/V Shunt Intervention;  Surgeon: Algernon Huxley, MD;  Location: [place] [facility];  Service: Cardiovascular;  Laterality: N/A;  . PERITONEAL CATHETER INSERTION N/A   . REMOVAL OF A DIALYSIS CATHETER N/A 01/14/2015   Procedure: REMOVAL OF A  PERITONEAL DIALYSIS CATHETER;  Surgeon: Algernon Huxley, MD;  Location: [facility];  Service: Vascular;  Laterality: N/A;  . TEE WITHOUT CARDIOVERSION N/A 04/19/2018   Procedure: TRANSESOPHAGEAL ECHOCARDIOGRAM (TEE);  Surgeon: Teodoro Spray, MD;  Location: [facility];  Service: Cardiovascular;  Laterality: N/A;     Social  history Lives on his own Non-smoker No alcohol His wife died 2 years ago from ovarian malignancy  Family History  Problem Relation Age of Onset  . Diabetes Mother   . Heart disease Father    No Known Allergies  Current outpatient medication Amlodipine Aspirin Carvedilol Sensipar Unasyn Vancomycin ?   Abtx:  Anti-infectives (From admission, onward)   None      REVIEW OF SYSTEMS:  Const: negative fever, negative chills, negative weight loss Eyes: negative diplopia or visual changes, negative eye pain ENT: negative coryza, negative sore throat Resp: negative cough, hemoptysis, dyspnea Cards: negative for chest pain, palpitations, lower extremity edema GU: negative for frequency, dysuria and hematuria GI: Negative for abdominal pain, 2 episodes of loose stools today , no bleeding, constipation Skin: negative for rash and pruritus Heme: negative for easy bruising and gum/nose bleeding MS: negative for myalgias, arthralgias, back pain and muscle weakness Neurolo:negative for headaches, some dizziness no vertigo, memory problems  Psych: negative for feelings of anxiety, depression  Endocrine: No polyuria or polydipsia Allergy/Immunology- negative for any medication or food allergies ?  Objective:  VITALS:  BP (!) 195/96 (BP Location: Right Arm, Patient Position: Sitting, Cuff Size: Normal)   Pulse (!) 104   Wt 202 lb (91.6 kg)   BMI 26.65 kg/m  PHYSICAL EXAM:  General: Alert, cooperative, no distress, appears stated age.  Pale Head: Normocephalic, without obvious abnormality, atraumatic. Eyes: Conjunctivae clear, anicteric sclerae. Pupils are equal ENT Nares normal. No  drainage or sinus tenderness. Lips, mucosa, and tongue normal. No Thrush Neck: Supple, symmetrical, no adenopathy, thyroid: non tender no carotid bruit and no JVD. Back: No CVA tenderness. Lungs: Clear to auscultation bilaterally. No Wheezing or Rhonchi. No rales. Heart: B6-L8, loud systolic  murmur Abdomen: Soft, non-tender,not distended. Bowel sounds normal. No masses Extremities: Dorsalis pedis pulse felt . atraumatic, no cyanosis.  Bilateral ankle edema. No clubbing Skin: No rashes or lesions. Or bruising Lymph: Cervical, supraclavicular normal. Neurologic: Grossly non-focal Pertinent Labs Lab Results CBC 08/20/2018 Hemoglobin 9.89 WBC 8.7 CRP 5 normal 0-10    Microbiology: No results found for this or any previous visit (from the past 240 hour(s)).  IMAGING RESULTS: None recently ? 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  Now on prolonged course of IV antibiotic. ?Severe aortitis-status post multiple stent placement and now getting second course of 8 weeks of IV antibiotic which she will finish on September 11, 2018.  He is currently on IV vancomycin during dialysis and IV Unasyn at home.  After February 26 we will start him on Augmentin adjusted to dialysis dose of 500 mg once a day.  We will continue with IV vancomycin.  Will evaluate after a week of Augmentin to see whether his infection markers as well as his clinical symptoms are stable.  If that is the case then we can remove the power line and continue him indefinitely on oral Augmentin.  Would continue IV vancomycin for another couple of months.  Because of severe aortitis and pseudoaneurysm and multiple stents not sure of the duration of the IV vancomycin.  Would err on the side of caution and treat him for a longer course C. septicum aortitis hasvery poor prognosis and without surgical intervention the mortality is usually  high. -but he is a high risk candidate.hence he was managed by intravascular stents   S/p stents placement in aorta, common iliac, rt and left iliac and external iliac on 07/15/18 Saw vascular nurse practitioner on 08/20/2018 and plan is for ultrasound surveillance of the aorta indefinitely every 6  months  Colon Cancer- s/p right hemicolectomy  and primary anastomosis  ESRD on dialysis  HTN-on amlodipine  Discussed the guarded to serious  Prognosis of his condition and management with the patient and his son. Told him to watch for warning signs like abdominal pain, back pain bleeding per rectum, severe dizziness or weakness of legs and to report to the ED if any of the symptoms are present. Will follow him in 4 months ? ? ___________________________________________________ Note:  This document was prepared using Dragon voice recognition software and may include unintentional dictation errors.

## 2018-08-27 NOTE — Patient Instructions (Addendum)
You are here for follow up- you are being treated for aortitis, clostridium bacteremia and you are being treated with IV antibiotic. You will be finiishing one Iv antibiotic at the end of this month and I will start you on oral augmentin 500mg  one tablet one a day at 7 pm indefinitely. We will  watch for a week befor removing the central line Follow up 4 months

## 2018-08-28 ENCOUNTER — Other Ambulatory Visit: Payer: Self-pay | Admitting: Infectious Diseases

## 2018-09-11 ENCOUNTER — Telehealth: Payer: Self-pay | Admitting: Licensed Clinical Social Worker

## 2018-09-11 ENCOUNTER — Other Ambulatory Visit: Payer: Self-pay | Admitting: Infectious Diseases

## 2018-09-11 MED ORDER — AMOXICILLIN-POT CLAVULANATE 500-125 MG PO TABS: 1.0000 | ORAL_TABLET | Freq: Every day | ORAL | 3 refills | Status: DC

## 2018-09-11 NOTE — Telephone Encounter (Signed)
Daughter [name] called asking if I could call his new oral medication in to [facility] since today is his last day of oral antibiotics.  Oral antibiotic will be called in today.

## 2018-09-17 ENCOUNTER — Telehealth: Payer: Self-pay | Admitting: Licensed Clinical Social Worker

## 2018-09-17 ENCOUNTER — Other Ambulatory Visit: Payer: Self-pay | Admitting: Licensed Clinical Social Worker

## 2018-09-17 DIAGNOSIS — I776 Arteritis, unspecified: Secondary | ICD-10-CM

## 2018-09-17 NOTE — Telephone Encounter (Signed)
I called the patient to see how he was feeling since starting oral antibiotics and he states he is feeling good. He states that he has 2 to 3 loose stools a day, no abdominal pain and his appetite is good. No nausea and vomiting, fever or chills. I have placed an order to have his central line removed since IV antibiotics are complete. I am waiting on someone to call from interventional radiology to give me a time and date.

## 2018-09-17 NOTE — Telephone Encounter (Signed)
thanks

## 2018-09-18 ENCOUNTER — Encounter (INDEPENDENT_AMBULATORY_CARE_PROVIDER_SITE_OTHER): Payer: Self-pay

## 2018-09-18 ENCOUNTER — Telehealth (INDEPENDENT_AMBULATORY_CARE_PROVIDER_SITE_OTHER): Payer: Self-pay

## 2018-09-18 NOTE — Telephone Encounter (Signed)
Patient was scheduled for a Hickman cath removal with Dr. Lucky Cowboy for 09/23/2018. Tamika from Infectious disease called stated the patient needs a Tuesday so the patient was moved to 09/24/2018 with Dr. Delana Meyer, the patient's arrival time is 9:00 am.

## 2018-09-23 ENCOUNTER — Telehealth: Payer: Self-pay

## 2018-09-23 ENCOUNTER — Other Ambulatory Visit (INDEPENDENT_AMBULATORY_CARE_PROVIDER_SITE_OTHER): Payer: Self-pay | Admitting: Nurse Practitioner

## 2018-09-23 MED ORDER — CEFAZOLIN SODIUM-DEXTROSE 2-4 GM/100ML-% IV SOLN: 2.0000 g | Freq: Once | INTRAVENOUS | Status: DC

## 2018-09-24 ENCOUNTER — Encounter: Payer: Self-pay | Admitting: Emergency Medicine

## 2018-09-24 ENCOUNTER — Ambulatory Visit
Admission: RE | Admit: 2018-09-24 | Discharge: 2018-09-24 | Disposition: A | Discharge status: Home / Self Care | Payer: Medicare HMO | Attending: Vascular Surgery | Admitting: Vascular Surgery

## 2018-09-24 ENCOUNTER — Encounter
Admission: RE | Disposition: A | Discharge status: Home / Self Care | Payer: Self-pay | Source: Home / Self Care | Attending: Vascular Surgery

## 2018-09-24 ENCOUNTER — Other Ambulatory Visit: Payer: Self-pay

## 2018-09-24 DIAGNOSIS — E1122 Type 2 diabetes mellitus with diabetic chronic kidney disease: Secondary | ICD-10-CM | POA: Insufficient documentation

## 2018-09-24 DIAGNOSIS — I776 Arteritis, unspecified: Secondary | ICD-10-CM | POA: Diagnosis not present

## 2018-09-24 DIAGNOSIS — I120 Hypertensive chronic kidney disease with stage 5 chronic kidney disease or end stage renal disease: Secondary | ICD-10-CM | POA: Diagnosis not present

## 2018-09-24 DIAGNOSIS — Z79899 Other long term (current) drug therapy: Secondary | ICD-10-CM

## 2018-09-24 DIAGNOSIS — R7881 Bacteremia: Secondary | ICD-10-CM | POA: Diagnosis not present

## 2018-09-24 DIAGNOSIS — Z452 Encounter for adjustment and management of vascular access device: Secondary | ICD-10-CM | POA: Insufficient documentation

## 2018-09-24 DIAGNOSIS — Z992 Dependence on renal dialysis: Secondary | ICD-10-CM | POA: Insufficient documentation

## 2018-09-24 DIAGNOSIS — Z8679 Personal history of other diseases of the circulatory system: Secondary | ICD-10-CM | POA: Diagnosis not present

## 2018-09-24 DIAGNOSIS — N186 End stage renal disease: Secondary | ICD-10-CM | POA: Insufficient documentation

## 2018-09-24 DIAGNOSIS — A498 Other bacterial infections of unspecified site: Secondary | ICD-10-CM

## 2018-09-24 HISTORY — PX: DIALYSIS/PERMA CATHETER REMOVAL: CATH118289

## 2018-09-24 SURGERY — DIALYSIS/PERMA CATHETER REMOVAL
Anesthesia: LOCAL

## 2018-09-24 MED ORDER — LIDOCAINE-EPINEPHRINE (PF) 1 %-1:200000 IJ SOLN
INTRAMUSCULAR | Status: AC
  Filled 2018-09-24: qty 30

## 2018-09-24 SURGICAL SUPPLY — 2 items
FORCEPS HALSTEAD CVD 5IN STRL (INSTRUMENTS) ×2 IMPLANT
TRAY LACERAT/PLASTIC (MISCELLANEOUS) ×2 IMPLANT

## 2018-09-24 NOTE — Op Note (Signed)
  OPERATIVE NOTE   PROCEDURE: 1. Removal of a single-lumen Hickman right IJ tunneled dialysis catheter  PRE-OPERATIVE DIAGNOSIS: Aortitis requiring parenteral antibiotics  POST-OPERATIVE DIAGNOSIS: Same  SURGEON: Hortencia Pilar, M.D.  ANESTHESIA: Local anesthetic with 1% lidocaine with epinephrine   ESTIMATED BLOOD LOSS: Minimal   FINDING(S): 1. Catheter intact   SPECIMEN(S):  Catheter  INDICATIONS:   Jonathan Mcgrath is a 78 y.o. male who presents with history of aortitis status post stent grafting to prevent lethal rupture.  Subsequently, he underwent prolonged intravenous antibiotic therapy.  He has now completed this course and no longer needs the tunnel catheter.  Risks and benefits were reviewed patient has agreed to proceed with removal of tunneled catheter.   DESCRIPTION: After obtaining full informed written consent, the patient was positioned supine. The right IJ tunneled catheter and surrounding area is prepped and draped in a sterile fashion. The cuff was localized by palpation and noted to be less than 3 cm from the exit site. After appropriate timeout is called, 1% lidocaine with epinephrine is infiltrated into the surrounding tissues around the cuff. Small transverse incision is created at the exit site with an 11 blade scalpel and the dissection was carried up along the catheter to expose the cuff of the tunneled catheter.  The catheter cuff is then freed from the surrounding attachments and adhesions. Once the catheter has been freed circumferentially it is removed in 1 piece. Light pressure was held at the base of the neck.   Antibiotic ointment and a sterile dressing is applied to the exit site. Patient tolerated procedure well and there were no complications.  COMPLICATIONS: None  CONDITION: Unchanged  Hortencia Pilar, M.D. [facility] Office: [phone]  09/24/2018,11:32 AM

## 2018-09-24 NOTE — Discharge Instructions (Signed)
Incision Care, Adult °An incision is a surgical cut that is made through your skin. Most incisions are closed after surgery. Your incision may be closed with stitches (sutures), staples, skin glue, or adhesive strips. You may need to return to your health care provider to have sutures or staples removed. This may occur several days to several weeks after your surgery. The incision needs to be cared for properly to prevent infection. °How to care for your incision °Incision care ° °· Follow instructions from your health care provider about how to take care of your incision. Make sure you: °? Wash your hands with soap and water before you change the bandage (dressing). If soap and water are not available, use hand sanitizer. °? Change your dressing as told by your health care provider. °? Leave sutures, skin glue, or adhesive strips in place. These skin closures may need to stay in place for 2 weeks or longer. If adhesive strip edges start to loosen and curl up, you may trim the loose edges. Do not remove adhesive strips completely unless your health care provider tells you to do that. °· Check your incision area every day for signs of infection. Check for: °? More redness, swelling, or pain. °? More fluid or blood. °? Warmth. °? Pus or a bad smell. °· Ask your health care provider how to clean the incision. This may include: °? Using mild soap and water. °? Using a clean towel to pat the incision dry after cleaning it. °? Applying a cream or ointment. Do this only as told by your health care provider. °? Covering the incision with a clean dressing. °· Ask your health care provider when you can leave the incision uncovered. °· Do not take baths, swim, or use a hot tub until your health care provider approves. Ask your health care provider if you can take showers. You may only be allowed to take sponge baths for bathing. °Medicines °· If you were prescribed an antibiotic medicine, cream, or ointment, take or apply the  antibiotic as told by your health care provider. Do not stop taking or applying the antibiotic even if your condition improves. °· Take over-the-counter and prescription medicines only as told by your health care provider. °General instructions °· Limit movement around your incision to improve healing. °? Avoid straining, lifting, or exercise for the first month, or for as long as told by your health care provider. °? Follow instructions from your health care provider about returning to your normal activities. °? Ask your health care provider what activities are safe. °· Protect your incision from the sun when you are outside for the first 6 months, or for as long as told by your health care provider. Apply sunscreen around the scar or cover it up. °· Keep all follow-up visits as told by your health care provider. This is important. °Contact a health care provider if: °· Your have more redness, swelling, or pain around the incision. °· You have more fluid or blood coming from the incision. °· Your incision feels warm to the touch. °· You have pus or a bad smell coming from the incision. °· You have a fever or shaking chills. °· You are nauseous or you vomit. °· You are dizzy. °· Your sutures or staples come undone. °Get help right away if: °· You have a red streak coming from your incision. °· Your incision bleeds through the dressing and the bleeding does not stop with gentle pressure. °· The edges of   your incision open up and separate. °· You have severe pain. °· You have a rash. °· You are confused. °· You faint. °· You have trouble breathing and a fast heartbeat. °This information is not intended to replace advice given to you by your health care provider. Make sure you discuss any questions you have with your health care provider. °Document Released: 01/20/2005 Document Revised: 03/10/2016 Document Reviewed: 01/19/2016 °[employer] Interactive Patient Education © 2019 [employer] ° °

## 2018-09-24 NOTE — H&P (Signed)
[facility] Vascular Consult Note  MRN : [id number]  Jonathan Mcgrath is a 78 y.o. (10-03-1940) male who presents with chief complaint of Hickman Catheter Removal.  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  The patient was being followed and treated by infectious disease.  The patient was currently on IV Unasyn and IV vancomycin.  He has received 6 weeks of therapy.  The patient has now finished his course of IV antibiotics and has been transitioned to oral antibiotics.  He presents today for removal of his Hickman line.  The patient presents today without complaint.  Denies any nausea, vomiting or fever.  Denies any abdominal pain.  Denies any issues with the Hickman line or surrounding skin.  Current Facility-Administered Medications  Medication Dose Route Frequency Provider Last Rate Last Dose  . ceFAZolin (ANCEF) IVPB 2g/100 mL premix  2 g Intravenous Once Kris Hartmann, NP       Past Medical History:  Diagnosis Date  . Cancer ([place]) 2019   colon  . Chronic kidney disease   . Diabetes mellitus without complication (HCC)    diet controlled  . Hemodialysis access site with arteriovenous graft ([place])   . Hypertension   . Infection due to Clostridium septicum ([place]) 08/27/2018  . Renal insufficiency    Pt on dialysis x 3 years and receives every Monday, Wednesday and Friday.    Past Surgical History:  Procedure Laterality Date  . AV FISTULA PLACEMENT Left 2015   arm  . CATARACT EXTRACTION W/ INTRAOCULAR LENS  IMPLANT, BILATERAL Bilateral 08/2017  . CENTRAL LINE INSERTION-TUNNELED N/A 04/19/2018   Procedure: CENTRAL LINE INSERTION-TUNNELED;  Surgeon: Delana Meyer Dolores Lory, MD;  Location: [facility];  Service: Cardiovascular;  Laterality: N/A;  . CENTRAL VENOUS CATHETER INSERTION  07/15/2018   Procedure: Insertion Hickman Catheter;  Surgeon: Algernon Huxley, MD;  Location: [facility];  Service: Cardiovascular;;  . COLON RESECTION N/A 05/16/2018   Procedure: LAPAROSCOPIC  COLON RESECTION;  Surgeon: Herbert Pun, MD;  Location: [facility];  Service: General;  Laterality: N/A;  . COLONOSCOPY N/A 04/18/2018   Procedure: COLONOSCOPY;  Surgeon: Virgel Manifold, MD;  Location: [facility];  Service: Endoscopy;  Laterality: N/A;  . DIALYSIS/PERMA CATHETER REMOVAL N/A 06/19/2018   Procedure: DIALYSIS/PERMA CATHETER REMOVAL;  Surgeon: Algernon Huxley, MD;  Location: [facility];  Service: Cardiovascular;  Laterality: N/A;  . ESOPHAGOGASTRODUODENOSCOPY N/A 04/18/2018   Procedure: ESOPHAGOGASTRODUODENOSCOPY (EGD);  Surgeon: Virgel Manifold, MD;  Location: [facility];  Service: Endoscopy;  Laterality: N/A;  . EYE SURGERY    . PELVIC ANGIOGRAPHY  07/15/2018   Procedure: PELVIC ANGIOGRAPHY;  Surgeon: Katha Cabal, MD;  Location: [facility];  Service: Cardiovascular;;  . PERIPHERAL VASCULAR BALLOON ANGIOPLASTY Bilateral 07/15/2018   Procedure: PERIPHERAL VASCULAR BALLOON ANGIOPLASTY;  Surgeon: Algernon Huxley, MD;  Location: [facility];  Service: Cardiovascular;  Laterality: Bilateral;  . PERIPHERAL VASCULAR CATHETERIZATION Left 01/12/2016   Procedure: A/V Shuntogram/Fistulagram;  Surgeon: Algernon Huxley, MD;  Location: [facility];  Service: Cardiovascular;  Laterality: Left;  . PERIPHERAL VASCULAR CATHETERIZATION N/A 01/12/2016   Procedure: A/V Shunt Intervention;  Surgeon: Algernon Huxley, MD;  Location: [facility];  Service: Cardiovascular;  Laterality: N/A;  . PERITONEAL CATHETER INSERTION N/A   . REMOVAL OF A DIALYSIS CATHETER N/A 01/14/2015   Procedure: REMOVAL OF A  PERITONEAL DIALYSIS CATHETER;  Surgeon: Algernon Huxley, MD;   Location: [facility];  Service: Vascular;  Laterality: N/A;  . TEE WITHOUT CARDIOVERSION N/A 04/19/2018   Procedure: TRANSESOPHAGEAL ECHOCARDIOGRAM (TEE);  Surgeon: Teodoro Spray, MD;  Location: [facility];  Service: Cardiovascular;  Laterality: N/A;   Social History Social History   Tobacco Use  . Smoking status: Never Smoker  . Smokeless tobacco: Never Used  Substance Use Topics  . Alcohol use: No  . Drug use: No   Family History Family History  Problem Relation Age of Onset  . Diabetes Mother   . Heart disease Father   Denies any family history of peripheral artery disease, venous disease or renal disease  No Known Allergies  REVIEW OF SYSTEMS (Negative unless checked)  Constitutional: [] Weight loss  [] Fever  [] Chills Cardiac: [] Chest pain   [] Chest pressure   [] Palpitations   [] Shortness of breath when laying flat   [] Shortness of breath at rest   [] Shortness of breath with exertion. Vascular:  [] Pain in legs with walking   [] Pain in legs at rest   [] Pain in legs when laying flat   [] Claudication   [] Pain in feet when walking  [] Pain in feet at rest  [] Pain in feet when laying flat   [] History of DVT   [] Phlebitis   [x] Swelling in legs   [] Varicose veins   [] Non-healing ulcers Pulmonary:   [] Uses home oxygen   [] Productive cough   [] Hemoptysis   [] Wheeze  [] COPD   [] Asthma Neurologic:  [] Dizziness  [] Blackouts   [] Seizures   [] History of stroke   [] History of TIA  [] Aphasia   [] Temporary blindness   [] Dysphagia   [] Weakness or numbness in arms   [] Weakness or numbness in legs Musculoskeletal:  [] Arthritis   [] Joint swelling   [] Joint pain   [] Low back pain Hematologic:  [] Easy bruising  [] Easy bleeding   [] Hypercoagulable state   [x] Anemic  [] Hepatitis Gastrointestinal:  [] Blood in stool   [] Vomiting blood  [x] Gastroesophageal reflux/heartburn   [] Difficulty swallowing. Genitourinary:  [] Chronic kidney disease   [] Difficult urination  [] Frequent urination  [] Burning with urination    [] Blood in urine Skin:  [] Rashes   [] Ulcers   [] Wounds Psychological:  [] History of anxiety   []  History of major depression.  Physical Examination  Vitals:   09/24/18 0850  BP: (!) 188/72  Pulse: 72  Resp: 14  Temp: 98.2 F (36.8 C)  TempSrc: Oral  SpO2: 93%   There is no height or weight on file to calculate BMI. Gen: WD/WN Head: [place]/AT, No temporalis wasting Ear/Nose/Throat: Hearing grossly intact, nares w/o erythema or drainage Eyes: Sclera non-icteric, conjunctiva clear Neck: Supple, no nuchal rigidity.  No JVD.  Pulmonary:  Good air movement, clear to auscultation bilaterally.  Cardiac: RRR, normal S1, S2, no Murmurs, rubs or gallops. Vascular: Bilateral extremities are warm distally to the toes. Gastrointestinal: soft, non-tender/non-distended. No guarding/reflex.  Musculoskeletal: M/S 5/5 throughout.  Extremities without ischemic changes.  No deformity or atrophy.  Mild edema in the lower extremities bilaterally Neurologic: Motor/sensory is intact Psychiatric: Difficult to assess due to the severity of patient's illness. Dermatologic: No rashes or ulcers noted.   Lymph : No Cervical, Axillary, or Inguinal lymphadenopathy.  CBC Lab Results  Component Value Date   WBC 17.0 (H) 07/18/2018   HGB 8.7 (L) 07/18/2018  HCT 26.9 (L) 07/18/2018   MCV 92.1 07/18/2018   PLT 180 07/18/2018   BMET    Component Value Date/Time   NA 140 07/17/2018 0933   NA 140 04/13/2014 0507   K 3.6 07/17/2018 0933   K 4.6 04/13/2014 0507   CL 102 07/17/2018 0933   CL 109 (H) 04/13/2014 0507   CO2 26 07/17/2018 0933   CO2 24 04/13/2014 0507   GLUCOSE 144 (H) 07/17/2018 0933   GLUCOSE 139 (H) 04/13/2014 0507   BUN 53 (H) 07/17/2018 0933   BUN 79 (H) 04/13/2014 0507   CREATININE 7.90 (H) 07/17/2018 0933   CREATININE 9.83 (H) 04/13/2014 0507   CALCIUM 7.8 (L) 07/17/2018 0933   CALCIUM 6.8 (LL) 04/13/2014 0507   GFRNONAA 6 (L) 07/17/2018 0933   GFRNONAA 6 (L) 04/13/2014 0507    GFRNONAA 8 (L) 06/05/2013 0604   GFRAA 7 (L) 07/17/2018 0933   GFRAA 7 (L) 04/13/2014 0507   GFRAA 9 (L) 06/05/2013 0604   CrCl cannot be calculated (Patient's most recent lab result is older than the maximum 21 days allowed.).  COAG Lab Results  Component Value Date   INR 1.12 05/14/2018   INR 1.34 04/12/2018   INR 1.13 01/12/2015   Radiology No results found.  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  . 1. Aortitis: The patient has finished his course of IV antibiotics and has been transitioned to p.o. antibiotics by infectious disease.  He no longer has need for his Hickman catheter and as a source of infection he presents today to have it removed.  Procedure, risks and benefits explained to the patient.  All questions answered.  The patient wishes to proceed. 2. Diabetes: Encouraged good control as its slows the progression of atherosclerotic disease  Discussed with Dr. Francene Castle, PA-C  09/24/2018 8:53 AM

## 2018-09-24 NOTE — H&P (Signed)
[facility] Admission History & Physical  MRN : [id number]  Jonathan Mcgrath is a 78 y.o. (1940/07/24) male who presents with chief complaint of No chief complaint on file. Marland Kitchen  History of Present Illness: I am asked to evaluate the patient by Infectious Disease. The patient was sent here because He has completed his antibiotic therapy and the tunneled catheter is no longer needed.   The patient required parenteral antibiotics after he was found to have aortitis.  At that time he underwent successful stent graft placement to prevent lethal rupture and then continued antibiotic therapy to complete the treatment.  The patient reports they're not been any problems with any of his infusions.   Patient denies pain or tenderness overlying the access.  No fevers or chills while on dialysis.   Current Facility-Administered Medications  Medication Dose Route Frequency Provider Last Rate Last Dose  . ceFAZolin (ANCEF) IVPB 2g/100 mL premix  2 g Intravenous Once Kris Hartmann, NP        Past Medical History:  Diagnosis Date  . Cancer ([place]) 2019   colon  . Chronic kidney disease   . Diabetes mellitus without complication (HCC)    diet controlled  . Hemodialysis access site with arteriovenous graft ([place])   . Hypertension   . Infection due to Clostridium septicum ([place]) 08/27/2018  . Renal insufficiency    Pt on dialysis x 3 years and receives every Monday, Wednesday and Friday.     Past Surgical History:  Procedure Laterality Date  . AV FISTULA PLACEMENT Left 2015   arm  . CATARACT EXTRACTION W/ INTRAOCULAR LENS  IMPLANT, BILATERAL Bilateral 08/2017  . CENTRAL LINE INSERTION-TUNNELED N/A 04/19/2018   Procedure: CENTRAL LINE INSERTION-TUNNELED;  Surgeon: Delana Meyer Dolores Lory, MD;  Location: [facility];  Service: Cardiovascular;  Laterality: N/A;  . CENTRAL VENOUS CATHETER INSERTION  07/15/2018   Procedure: Insertion Hickman Catheter;  Surgeon: Algernon Huxley,  MD;  Location: [facility];  Service: Cardiovascular;;  . COLON RESECTION N/A 05/16/2018   Procedure: LAPAROSCOPIC  COLON RESECTION;  Surgeon: Herbert Pun, MD;  Location: [facility];  Service: General;  Laterality: N/A;  . COLONOSCOPY N/A 04/18/2018   Procedure: COLONOSCOPY;  Surgeon: Virgel Manifold, MD;  Location: [facility];  Service: Endoscopy;  Laterality: N/A;  . DIALYSIS/PERMA CATHETER REMOVAL N/A 06/19/2018   Procedure: DIALYSIS/PERMA CATHETER REMOVAL;  Surgeon: Algernon Huxley, MD;  Location: [facility];  Service: Cardiovascular;  Laterality: N/A;  . ESOPHAGOGASTRODUODENOSCOPY N/A 04/18/2018   Procedure: ESOPHAGOGASTRODUODENOSCOPY (EGD);  Surgeon: Virgel Manifold, MD;  Location: [facility];  Service: Endoscopy;  Laterality: N/A;  . EYE SURGERY    . PELVIC ANGIOGRAPHY  07/15/2018   Procedure: PELVIC ANGIOGRAPHY;  Surgeon: Katha Cabal, MD;  Location: [facility];  Service: Cardiovascular;;  . PERIPHERAL VASCULAR BALLOON ANGIOPLASTY Bilateral 07/15/2018   Procedure: PERIPHERAL VASCULAR BALLOON ANGIOPLASTY;  Surgeon: Algernon Huxley, MD;  Location: [facility];  Service: Cardiovascular;  Laterality: Bilateral;  . PERIPHERAL VASCULAR CATHETERIZATION Left 01/12/2016   Procedure: A/V Shuntogram/Fistulagram;  Surgeon: Algernon Huxley, MD;  Location: [facility];  Service: Cardiovascular;  Laterality: Left;  . PERIPHERAL VASCULAR CATHETERIZATION N/A 01/12/2016   Procedure: A/V Shunt Intervention;  Surgeon: Algernon Huxley, MD;  Location: [facility];  Service: Cardiovascular;  Laterality: N/A;  . PERITONEAL CATHETER INSERTION N/A   . REMOVAL OF A DIALYSIS CATHETER N/A 01/14/2015  Procedure: REMOVAL OF A  PERITONEAL DIALYSIS CATHETER;  Surgeon: Algernon Huxley, MD;  Location: [facility];  Service: Vascular;  Laterality: N/A;  . TEE WITHOUT CARDIOVERSION N/A 04/19/2018   Procedure: TRANSESOPHAGEAL ECHOCARDIOGRAM (TEE);  Surgeon: Teodoro Spray, MD;  Location: [facility];  Service: Cardiovascular;  Laterality: N/A;    Social History Social History   Tobacco Use  . Smoking status: Never Smoker  . Smokeless tobacco: Never Used  Substance Use Topics  . Alcohol use: No  . Drug use: No    Family History Family History  Problem Relation Age of Onset  . Diabetes Mother   . Heart disease Father     No family history of bleeding or clotting disorders, autoimmune disease or porphyria  No Known Allergies   REVIEW OF SYSTEMS (Negative unless checked)  Constitutional: [] Weight loss  [] Fever  [] Chills Cardiac: [] Chest pain   [] Chest pressure   [] Palpitations   [] Shortness of breath when laying flat   [] Shortness of breath at rest   [x] Shortness of breath with exertion. Vascular:  [] Pain in legs with walking   [] Pain in legs at rest   [] Pain in legs when laying flat   [] Claudication   [] Pain in feet when walking  [] Pain in feet at rest  [] Pain in feet when laying flat   [] History of DVT   [] Phlebitis   [] Swelling in legs   [] Varicose veins   [] Non-healing ulcers Pulmonary:   [] Uses home oxygen   [] Productive cough   [] Hemoptysis   [] Wheeze  [] COPD   [] Asthma Neurologic:  [] Dizziness  [] Blackouts   [] Seizures   [] History of stroke   [] History of TIA  [] Aphasia   [] Temporary blindness   [] Dysphagia   [] Weakness or numbness in arms   [] Weakness or numbness in legs Musculoskeletal:  [] Arthritis   [] Joint swelling   [] Joint pain   [] Low back pain Hematologic:  [] Easy bruising  [] Easy bleeding   [] Hypercoagulable state   [] Anemic  [] Hepatitis Gastrointestinal:  [] Blood in stool   [] Vomiting blood  [] Gastroesophageal reflux/heartburn   [] Difficulty swallowing. Genitourinary:  [] Chronic kidney disease   [] Difficult urination  [] Frequent urination  [] Burning with urination   [] Blood in urine Skin:  [] Rashes   [] Ulcers   [] Wounds Psychological:  [] History of anxiety   []  History of major depression.  Physical Examination  Vitals:    09/24/18 0850  BP: (!) 188/72  Pulse: 72  Resp: 14  Temp: 98.2 F (36.8 C)  TempSrc: Oral  SpO2: 93%   There is no height or weight on file to calculate BMI. Gen: WD/WN, NAD Head: [place]/AT, No temporalis wasting. Prominent temp pulse not noted. Ear/Nose/Throat: Hearing grossly intact, nares w/o erythema or drainage, oropharynx w/o Erythema/Exudate,  Eyes: Conjunctiva clear, sclera non-icteric Neck: Trachea midline.  No JVD.  Pulmonary:  Good air movement, respirations not labored, no use of accessory muscles.  Cardiac: RRR, normal S1, S2. Vascular: Right IJ Hickman catheter clean dry and intact Vessel Right Left  Radial Palpable Palpable  Gastrointestinal: soft, non-tender/non-distended. No guarding/reflex.  Musculoskeletal: M/S 5/5 throughout.  Extremities without ischemic changes.  No deformity or atrophy.  Neurologic: Sensation grossly intact in extremities.  Symmetrical.  Speech is fluent. Motor exam as listed above. Psychiatric: Judgment intact, Mood & affect appropriate for pt's clinical situation. Dermatologic: No rashes or ulcers noted.  No cellulitis or open wounds. Lymph : No Cervical, Axillary, or Inguinal lymphadenopathy.   CBC Lab Results  Component Value Date   WBC  17.0 (H) 07/18/2018   HGB 8.7 (L) 07/18/2018   HCT 26.9 (L) 07/18/2018   MCV 92.1 07/18/2018   PLT 180 07/18/2018    BMET    Component Value Date/Time   NA 140 07/17/2018 0933   NA 140 04/13/2014 0507   K 3.6 07/17/2018 0933   K 4.6 04/13/2014 0507   CL 102 07/17/2018 0933   CL 109 (H) 04/13/2014 0507   CO2 26 07/17/2018 0933   CO2 24 04/13/2014 0507   GLUCOSE 144 (H) 07/17/2018 0933   GLUCOSE 139 (H) 04/13/2014 0507   BUN 53 (H) 07/17/2018 0933   BUN 79 (H) 04/13/2014 0507   CREATININE 7.90 (H) 07/17/2018 0933   CREATININE 9.83 (H) 04/13/2014 0507   CALCIUM 7.8 (L) 07/17/2018 0933   CALCIUM 6.8 (LL) 04/13/2014 0507   GFRNONAA 6 (L) 07/17/2018 0933   GFRNONAA 6 (L) 04/13/2014 0507    GFRNONAA 8 (L) 06/05/2013 0604   GFRAA 7 (L) 07/17/2018 0933   GFRAA 7 (L) 04/13/2014 0507   GFRAA 9 (L) 06/05/2013 0604   CrCl cannot be calculated (Patient's most recent lab result is older than the maximum 21 days allowed.).  COAG Lab Results  Component Value Date   INR 1.12 05/14/2018   INR 1.34 04/12/2018   INR 1.13 01/12/2015    Radiology No results found.  Assessment/Plan 1.  Aortitis:  Patient's right IJ Hickman tunneled catheter is no longer needed.  Parenteral antibiotic therapy has been completed.  The risks and benefits were described to the patient.  All questions were answered.  The patient agrees to proceed with catheter removal. 2.  End-stage renal disease on hemodialysis:   The patient will continue dialysis as arranged.  Appropriate dosing of medications is also being performed.  This will not have impact on catheter removal.   3.  Hypertension:  Patient will continue medical management; nephrology is following no changes in oral medications. 4. Diabetes mellitus:  Glucose will be monitored and oral medications been held this morning once the patient has undergone the patient's procedure po intake will be reinitiated and again Accu-Cheks will be used to assess the blood glucose level and treat as needed. The patient will be restarted on the patient's usual hypoglycemic regime    Hortencia Pilar, MD  09/24/2018 10:50 AM

## 2018-09-25 ENCOUNTER — Encounter: Payer: Self-pay | Admitting: Vascular Surgery

## 2018-10-01 ENCOUNTER — Telehealth: Payer: Self-pay | Admitting: Licensed Clinical Social Worker

## 2018-10-01 NOTE — Telephone Encounter (Signed)
Patient's son called stating he wanted [place] to know that his dad was starting to have weakness in his leg again, he fell last week in the yard. Son stated that patient denied back pain, abdominal pain, or blood in stools. Son states for now he will monitor his Dad and make sure he observes him for any warning signs that he may need emergency attention

## 2018-10-01 NOTE — Telephone Encounter (Signed)
I called and spoke with the patient he does not own a thermometer but he is going to get one and start checking.

## 2018-10-01 NOTE — Telephone Encounter (Signed)
Ask him to check his temperature and also tell the dialysis doctor so that they do labs- I will inform them as well

## 2018-10-09 NOTE — Telephone Encounter (Signed)
Can you please follow up? thx

## 2018-10-10 NOTE — Telephone Encounter (Signed)
I spoke to the patient and he denied feeling bad,no fevers, dizziness or abdominal pain. He stated that he felt "pretty good"

## 2018-10-10 NOTE — Telephone Encounter (Signed)
Thx

## 2018-11-26 ENCOUNTER — Ambulatory Visit (INDEPENDENT_AMBULATORY_CARE_PROVIDER_SITE_OTHER): Payer: Medicare HMO | Admitting: Vascular Surgery

## 2018-11-26 ENCOUNTER — Encounter (INDEPENDENT_AMBULATORY_CARE_PROVIDER_SITE_OTHER): Payer: Medicare HMO

## 2018-12-26 ENCOUNTER — Ambulatory Visit: Payer: Medicare HMO | Admitting: Infectious Diseases

## 2019-01-02 ENCOUNTER — Other Ambulatory Visit: Payer: Self-pay | Admitting: Infectious Diseases

## 2019-01-02 ENCOUNTER — Encounter: Payer: Self-pay | Admitting: Infectious Diseases

## 2019-01-02 ENCOUNTER — Ambulatory Visit: Payer: Medicare HMO | Attending: Infectious Diseases | Admitting: Infectious Diseases

## 2019-01-02 ENCOUNTER — Other Ambulatory Visit
Admission: RE | Admit: 2019-01-02 | Discharge: 2019-01-02 | Disposition: A | Discharge status: Home / Self Care | Payer: Medicare HMO | Source: Ambulatory Visit | Attending: Infectious Diseases | Admitting: Infectious Diseases

## 2019-01-02 ENCOUNTER — Other Ambulatory Visit: Payer: Self-pay

## 2019-01-02 VITALS — BP 171/72 | HR 74 | Temp 97.7°F | Ht 73.0 in | Wt 186.0 lb

## 2019-01-02 DIAGNOSIS — I776 Arteritis, unspecified: Secondary | ICD-10-CM | POA: Diagnosis not present

## 2019-01-02 DIAGNOSIS — I729 Aneurysm of unspecified site: Secondary | ICD-10-CM | POA: Diagnosis not present

## 2019-01-02 DIAGNOSIS — R011 Cardiac murmur, unspecified: Secondary | ICD-10-CM

## 2019-01-02 DIAGNOSIS — Z8619 Personal history of other infectious and parasitic diseases: Secondary | ICD-10-CM

## 2019-01-02 DIAGNOSIS — Z992 Dependence on renal dialysis: Secondary | ICD-10-CM

## 2019-01-02 DIAGNOSIS — I120 Hypertensive chronic kidney disease with stage 5 chronic kidney disease or end stage renal disease: Secondary | ICD-10-CM | POA: Diagnosis not present

## 2019-01-02 DIAGNOSIS — Z792 Long term (current) use of antibiotics: Secondary | ICD-10-CM

## 2019-01-02 DIAGNOSIS — A480 Gas gangrene: Secondary | ICD-10-CM

## 2019-01-02 DIAGNOSIS — E1122 Type 2 diabetes mellitus with diabetic chronic kidney disease: Secondary | ICD-10-CM | POA: Diagnosis not present

## 2019-01-02 DIAGNOSIS — N186 End stage renal disease: Secondary | ICD-10-CM

## 2019-01-02 DIAGNOSIS — Z85030 Personal history of malignant carcinoid tumor of large intestine: Secondary | ICD-10-CM

## 2019-01-02 DIAGNOSIS — Z9049 Acquired absence of other specified parts of digestive tract: Secondary | ICD-10-CM

## 2019-01-02 DIAGNOSIS — Z95820 Peripheral vascular angioplasty status with implants and grafts: Secondary | ICD-10-CM

## 2019-01-02 DIAGNOSIS — Z79899 Other long term (current) drug therapy: Secondary | ICD-10-CM

## 2019-01-02 LAB — CBC WITH DIFFERENTIAL/PLATELET
Abs Immature Granulocytes: 0.02 10*3/uL (ref 0.00–0.07)
Basophils Absolute: 0 10*3/uL (ref 0.0–0.1)
Basophils Relative: 0 %
Eosinophils Absolute: 0.2 10*3/uL (ref 0.0–0.5)
Eosinophils Relative: 1 %
HCT: 36.6 % — ABNORMAL LOW (ref 39.0–52.0)
Hemoglobin: 12 g/dL — ABNORMAL LOW (ref 13.0–17.0)
Immature Granulocytes: 0 %
Lymphocytes Relative: 74 %
Lymphs Abs: 10.8 10*3/uL — ABNORMAL HIGH (ref 0.7–4.0)
MCH: 29.7 pg (ref 26.0–34.0)
MCHC: 32.8 g/dL (ref 30.0–36.0)
MCV: 90.6 fL (ref 80.0–100.0)
Monocytes Absolute: 0.9 10*3/uL (ref 0.1–1.0)
Monocytes Relative: 6 %
Neutro Abs: 2.8 10*3/uL (ref 1.7–7.7)
Neutrophils Relative %: 19 %
Platelets: 83 10*3/uL — ABNORMAL LOW (ref 150–400)
RBC: 4.04 MIL/uL — ABNORMAL LOW (ref 4.22–5.81)
RDW: 16.2 % — ABNORMAL HIGH (ref 11.5–15.5)
WBC: 14.9 10*3/uL — ABNORMAL HIGH (ref 4.0–10.5)
nRBC: 0 % (ref 0.0–0.2)

## 2019-01-02 LAB — COMPREHENSIVE METABOLIC PANEL
ALT: 38 U/L (ref 0–44)
AST: 27 U/L (ref 15–41)
Albumin: 3.8 g/dL (ref 3.5–5.0)
Alkaline Phosphatase: 84 U/L (ref 38–126)
Anion gap: 9 (ref 5–15)
BUN: 46 mg/dL — ABNORMAL HIGH (ref 8–23)
CO2: 30 mmol/L (ref 22–32)
Calcium: 9.1 mg/dL (ref 8.9–10.3)
Chloride: 104 mmol/L (ref 98–111)
Creatinine, Ser: 6.39 mg/dL — ABNORMAL HIGH (ref 0.61–1.24)
GFR calc Af Amer: 9 mL/min — ABNORMAL LOW (ref >60)
GFR calc non Af Amer: 8 mL/min — ABNORMAL LOW (ref >60)
Glucose, Bld: 89 mg/dL (ref 70–99)
Potassium: 4.4 mmol/L (ref 3.5–5.1)
Sodium: 143 mmol/L (ref 135–145)
Total Bilirubin: 0.7 mg/dL (ref 0.3–1.2)
Total Protein: 6.5 g/dL (ref 6.5–8.1)

## 2019-01-02 LAB — C-REACTIVE PROTEIN: CRP: <0.8 mg/dL (ref <1.0)

## 2019-01-02 LAB — PATHOLOGIST SMEAR REVIEW

## 2019-01-02 LAB — SEDIMENTATION RATE: Sed Rate: 7 mm/hr (ref 0–20)

## 2019-01-02 MED ORDER — AMOXICILLIN-POT CLAVULANATE 500-125 MG PO TABS: 1.0000 | ORAL_TABLET | Freq: Every day | ORAL | 6 refills | Status: DC

## 2019-01-02 NOTE — Progress Notes (Signed)
NAME: Jonathan Mcgrath  DOB: [birth year]  MRN: [id number]  Date/Time: 01/02/2019 10:18 AM  REQUESTING PROVIDER Subjective:  REASON FOR CONSULT:  ?78 y.o. 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 He says he is doing well No fever or abdominal papin Has lost weight, says his appetitie was poor and he was busy moving homes- He is selling his home  and has moved to an independent living in [place].   Patient has had a complicated medical history since September 2019 when he first presented to the hospital with diarrhea, abdominal pain and fever and was diagnosed with clostridium septicum bacteremia. CTA of the abdomen showed Circumferential arterial wall thickening beginning at the aortic bifurcation and extending along the left greater than right common iliac arteries. There were associated inflammatory changes with inflammatory stranding in the immediately adjacent retroperitoneal fat and left pelvic sidewall. 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  He was seen by vascular surgery who recommended conservative management with IV antibiotics.  He was sent home on IV Zosyn to receive 6 weeks of therapy .  He had a repeat CT Angio of the abdomen and pelvis on 05/14/2018 in preparation for colectomy.  That was reported as Two contained 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 No contrast extravasation.2. Persistent though reduced perivascular stranding surrounding the distal abdominal aorta and left common iliac artery .  He underwent laparoscopic right extended hemicolectomy with primary anastomosis on  05/16/2018 by Dr. [name].  He was continued on IV Unasyn/zosyn  until 06/13/2018.  Power line  was removed on 06/19/2018.  The plan was for him to be on oral Augmentin.  He was readmitted to the hospital in December with bilateral flank pain weakness and dizziness.  CT angio of the abdomen and pelvis revealed left common iliac vascular abnormality concerning for pseudoaneurysm formation/contained perforation with adjacent perivascular developing abscess measuring up to 2.5 cm.  He was initially treated with high dose of IV steroids and also was restarted on IV antibiotics.  As he was a high risk candidate for resection and bypass of the infected aorta   he underwent on 07/15/2018 coil embolization of the left internal iliac artery, stent placement right common iliac artery, left common iliac artery and left external iliac artery by Dr. Lucky Cowboy and Dr. Franchot Gallo.  He was discharged on 07/18/2018 on IV Unasyn and IV vancomycin.  The latter was given during dialysis.  This was to continue until September 11, 2018. He finished IV antibiotic and has been taking suppressive oral amoxicilin/clavulanate 558m once a day since then. .Marland KitchenHe is feeling okay. He does not have any abdominal pain.no blood in the stool He uses a cane to walk- no falls He lives on his own. He goes to dialysis regularly Past Medical History:  Diagnosis Date  . Cancer ([place] 2019   colon  . Chronic kidney disease   . Diabetes mellitus without complication (HCC)    diet controlled  . Hemodialysis access [place] with arteriovenous graft ([place]   . Hypertension   . Infection due to Clostridium septicum ([facility] 08/27/2018  . Renal insufficiency    Pt on dialysis x 3 years and receives every Monday, Wednesday and  Friday.     Past Surgical History:  Procedure Laterality Date  . AV FISTULA PLACEMENT Left 2015   arm  . CATARACT EXTRACTION W/ INTRAOCULAR LENS  IMPLANT, BILATERAL Bilateral 08/2017  . CENTRAL LINE INSERTION-TUNNELED N/A 04/19/2018    Procedure: CENTRAL LINE INSERTION-TUNNELED;  Surgeon: Delana Meyer Dolores Lory, MD;  Location: [place] [facility];  Service: Cardiovascular;  Laterality: N/A;  . CENTRAL VENOUS CATHETER INSERTION  07/15/2018   Procedure: Insertion Hickman Catheter;  Surgeon: Algernon Huxley, MD;  Location: [place] [facility];  Service: Cardiovascular;;  . COLON RESECTION N/A 05/16/2018   Procedure: LAPAROSCOPIC  COLON RESECTION;  Surgeon: Herbert Pun, MD;  Location: [facility];  Service: General;  Laterality: N/A;  . COLONOSCOPY N/A 04/18/2018   Procedure: COLONOSCOPY;  Surgeon: Virgel Manifold, MD;  Location: [facility];  Service: Endoscopy;  Laterality: N/A;  . DIALYSIS/PERMA CATHETER REMOVAL N/A 06/19/2018   Procedure: DIALYSIS/PERMA CATHETER REMOVAL;  Surgeon: Algernon Huxley, MD;  Location: [place] [facility];  Service: Cardiovascular;  Laterality: N/A;  . DIALYSIS/PERMA CATHETER REMOVAL N/A 09/24/2018   Procedure: DIALYSIS/PERMA CATHETER REMOVAL;  Surgeon: Katha Cabal, MD;  Location: [place] [facility];  Service: Cardiovascular;  Laterality: N/A;  . ESOPHAGOGASTRODUODENOSCOPY N/A 04/18/2018   Procedure: ESOPHAGOGASTRODUODENOSCOPY (EGD);  Surgeon: Virgel Manifold, MD;  Location: [facility];  Service: Endoscopy;  Laterality: N/A;  . EYE SURGERY    . PELVIC ANGIOGRAPHY  07/15/2018   Procedure: PELVIC ANGIOGRAPHY;  Surgeon: Katha Cabal, MD;  Location: [place] [facility];  Service: Cardiovascular;;  . PERIPHERAL VASCULAR BALLOON ANGIOPLASTY Bilateral 07/15/2018   Procedure: PERIPHERAL VASCULAR BALLOON ANGIOPLASTY;  Surgeon: Algernon Huxley, MD;  Location: [place] [facility];  Service: Cardiovascular;  Laterality: Bilateral;  . PERIPHERAL VASCULAR CATHETERIZATION Left 01/12/2016   Procedure: A/V Shuntogram/Fistulagram;  Surgeon: Algernon Huxley, MD;  Location: [place] [facility];  Service: Cardiovascular;  Laterality: Left;  . PERIPHERAL VASCULAR CATHETERIZATION N/A 01/12/2016    Procedure: A/V Shunt Intervention;  Surgeon: Algernon Huxley, MD;  Location: [place] [facility];  Service: Cardiovascular;  Laterality: N/A;  . PERITONEAL CATHETER INSERTION N/A   . REMOVAL OF A DIALYSIS CATHETER N/A 01/14/2015   Procedure: REMOVAL OF A  PERITONEAL DIALYSIS CATHETER;  Surgeon: Algernon Huxley, MD;  Location: [facility];  Service: Vascular;  Laterality: N/A;  . TEE WITHOUT CARDIOVERSION N/A 04/19/2018   Procedure: TRANSESOPHAGEAL ECHOCARDIOGRAM (TEE);  Surgeon: Teodoro Spray, MD;  Location: [facility];  Service: Cardiovascular;  Laterality: N/A;    SH Non smoker No alcohol Or illicit drug use  Family History  Problem Relation Age of Onset  . Diabetes Mother   . Heart disease Father    No Known Allergies  ? Current Outpatient Medications  Medication Sig Dispense Refill  . amLODipine (NORVASC) 5 MG tablet Take 1 tablet by mouth daily.    Marland Kitchen amoxicillin-clavulanate (AUGMENTIN) 500-125 MG tablet Take 1 tablet (500 mg total) by mouth daily. 30 tablet 3  . carvedilol (COREG) 6.25 MG tablet Take 6.25 mg by mouth 2 (two) times daily with a meal.    . cinacalcet (SENSIPAR) 30 MG tablet Take 30 mg by mouth every morning.    . ferric citrate (AURYXIA) 1 GM 210 MG(Fe) tablet Take 210 mg by mouth daily.     . multivitamin (RENA-VIT) TABS tablet Take 1 tablet by mouth daily.  11  . aspirin EC 81 MG EC tablet Take 1 tablet (81 mg total) by  mouth daily. (Patient not taking: Reported on 01/02/2019)    . ondansetron (ZOFRAN) 4 MG tablet Take 4 mg by mouth 3 (three) times daily as needed for nausea/vomiting.     No current facility-administered medications for this visit.      Abtx:  Anti-infectives (From admission, onward)   None     REVIEW OF SYSTEMS: ?Const: negative fever, negative chills, positive weight loss- says 10 pounds Eyes: negative diplopia or visual changes, negative eye pain ENT: negative coryza, negative sore throat Resp: negative cough, hemoptysis, dyspnea Cards: negative  for chest pain, palpitations, lower extremity edema GU: makes little urine GI: Negative for abdominal pain,no loose stolls, regualr bowel movt, no melena Skin: negative for rash and pruritus Heme: negative for easy bruising and gum/nose bleeding MS: negative for myalgias, arthralgias, back pain and muscle weakness Neurolo:negative for headaches, some dizziness no vertigo, memory problems  Psych: negative for feelings of anxiety, depression  Endocrine: No polyuria or polydipsia Allergy/Immunology- negative for any medication or food allergies  Objective:  VITALS:  BP (!) 171/72 (BP Location: Right Arm, Patient Position: Sitting, Cuff Size: Normal)   Pulse 74   Temp 97.7 F (36.5 C) (Oral)   Ht '6\' 1"'  (1.854 m)   Wt 186 lb (84.4 kg)   BMI 24.54 kg/m  PHYSICAL EXAM:  General: Alert, cooperative, no distress, appears stated age.  Head: Normocephalic, without obvious abnormality, atraumatic. Eyes: Conjunctivae clear, anicteric sclerae. Pupils are equal ENT Nares normal. No drainage or sinus tenderness. Lips, mucosa, and tongue normal. No Thrush Neck: Supple, symmetrical, no adenopathy, thyroid: non tender no carotid bruit and no JVD. Back: No CVA tenderness. Lungs: Clear to auscultation bilaterally. No Wheezing or Rhonchi. No rales. Heart:systolic murmur Abdomen: Soft, non-tender,not distended. Bowel sounds normal. No masses, lap scar Extremities: atraumatic, no cyanosis. No edema. No clubbing Skin: No rashes or lesions. Or bruising Lymph: Cervical, supraclavicular normal. Neurologic: Grossly non-focal Pertinent Labs none    Microbiology: No results found for this or any previous visit (from the past 240 hour(s)).  IMAGING RESULTS: I have personally reviewed the films ? Impression/Recommendation ? 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  Now on indefinite antibiotic therapy ?Severe  aortitis- doing well- status post multiple stent placement , completed 8 weeks of IV unasyn on  September 11, 2018.  since then been on Amox/clav ( Augmentin )adjusted to dialysis dose of 500 mg once a day.   He is doing well  He should have completed Iv vancomycin during dialysis as well- will check with nephrologist C. septicum aortitis hasvery poor prognosis and without surgical intervention the mortality is usually  high. -but as he was a high risk candidate managed by intravascular stents- stable    S/p stents placement in aorta, common iliac, rt and left iliac and external iliac on 07/15/18 Saw vascular nurse practitioner on 08/20/2018 and plan is for ultrasound surveillance of the aorta indefinitely every 6 months  Colon Cancer- s/p right hemicolectomy  and primary anastomosis- follow up with Dr.Sakai  ESRD on dialysis  HTN-on amlodipine  Will do labs today- CRP, ESR, CBC, CMP  Told him to watch for warning signs like abdominal pain, back pain bleeding per rectum, severe dizziness or weakness of legs and to report to the ED if any of the symptoms are present. Will follow him in 6 months ? ?

## 2019-01-02 NOTE — Patient Instructions (Signed)
You are here for follow up of the aorta infection- you are doing well and stable- eventhough you have lost 14 pounds. You will be on amox/clav antibiotic 500mg  one pill a day indefinitely.i am send ing refills to your pharmacy- please follow up with your PCP, GI doctor, surgeon and kidney doctor Today I will do some labs- will see you back in 6 months

## 2019-03-25 ENCOUNTER — Ambulatory Visit: Payer: Medicare HMO | Admitting: Gastroenterology

## 2019-03-25 ENCOUNTER — Telehealth: Payer: Self-pay | Admitting: Gastroenterology

## 2019-03-25 ENCOUNTER — Other Ambulatory Visit: Payer: Self-pay

## 2019-03-25 NOTE — Telephone Encounter (Signed)
Pt son left vm to cancel apt for pt on 03/25/19 I called to confirm the cancellation he states he has a GI doctor and wants to cancel

## 2019-04-16 ENCOUNTER — Telehealth: Payer: Self-pay | Admitting: Licensed Clinical Social Worker

## 2019-04-16 NOTE — Telephone Encounter (Signed)
Patient's son called stating that he is having lower blood pressures reported by the patient. Weakness, and fatigue. Son states that the patient only wants to sleep and is bumping into the walls. I advised son to bring the patient to the ED today to be evaluated due to his history of illness. Son was in agreement with this plan.

## 2019-04-16 NOTE — Telephone Encounter (Signed)
Thanks,appreciate it 

## 2019-05-14 ENCOUNTER — Telehealth: Payer: Self-pay

## 2019-05-14 ENCOUNTER — Other Ambulatory Visit: Payer: Self-pay

## 2019-05-14 DIAGNOSIS — Z86010 Personal history of colonic polyps: Secondary | ICD-10-CM

## 2019-05-14 MED ORDER — NA SULFATE-K SULFATE-MG SULF 17.5-3.13-1.6 GM/177ML PO SOLN: 1.0000 | Freq: Once | ORAL | 0 refills | Status: AC

## 2019-05-14 NOTE — Telephone Encounter (Signed)
Contacted staff member at [facility] to let them know that I've faxed patients colonoscopy instructions to them, along with the rx for his SuPrep.  Patient stated he will go by the office tomorrow to pick up.  I asked him to call me back once he picks up his instructions so I can go over them with him in detail.  Thanks [employer]

## 2019-05-14 NOTE — Telephone Encounter (Signed)
Gastroenterology Pre-Procedure Review  Request Date: 05/22/19 Requesting Physician: Dr. Bonna Gains  PATIENT REVIEW QUESTIONS: The patient responded to the following health history questions as indicated:    1. Are you having any GI issues? no 2. Do you have a personal history of Polyps? yes (2019 with Dr. Bonna Gains) 3. Do you have a family history of Colon Cancer or Polyps? no 4. Diabetes Mellitus? no 5. Joint replacements in the past 12 months?no 6. Major health problems in the past 3 months?no 7. Any artificial heart valves, MVP, or defibrillator?no    MEDICATIONS & ALLERGIES:    Patient reports the following regarding taking any anticoagulation/antiplatelet therapy:   Plavix, Coumadin, Eliquis, Xarelto, Lovenox, Pradaxa, Brilinta, or Effient? no Aspirin? no  Patient confirms/reports the following medications:  Current Outpatient Medications  Medication Sig Dispense Refill  . amLODipine (NORVASC) 5 MG tablet Take 1 tablet by mouth daily.    Marland Kitchen amoxicillin-clavulanate (AUGMENTIN) 500-125 MG tablet Take 1 tablet (500 mg total) by mouth daily. 30 tablet 6  . aspirin EC 81 MG EC tablet Take 1 tablet (81 mg total) by mouth daily. (Patient not taking: Reported on 01/02/2019)    . carvedilol (COREG) 6.25 MG tablet Take 6.25 mg by mouth 2 (two) times daily with a meal.    . cinacalcet (SENSIPAR) 30 MG tablet Take 30 mg by mouth every morning.    . ferric citrate (AURYXIA) 1 GM 210 MG(Fe) tablet Take 210 mg by mouth daily.     Marland Kitchen losartan (COZAAR) 50 MG tablet     . Multiple Vitamin (MULTI-VITAMIN) tablet Take by mouth.    . multivitamin (RENA-VIT) TABS tablet Take 1 tablet by mouth daily.  11  . ondansetron (ZOFRAN) 4 MG tablet Take 4 mg by mouth 3 (three) times daily as needed for nausea/vomiting.     No current facility-administered medications for this visit.     Patient confirms/reports the following allergies:  No Known Allergies  No orders of the defined types were placed in this  encounter.   AUTHORIZATION INFORMATION Primary Insurance: 1D#: Group #:  Secondary Insurance: 1D#: Group #:  SCHEDULE INFORMATION: Date: 05/22/19 Time: Location:[facility]

## 2019-05-16 ENCOUNTER — Telehealth: Payer: Self-pay

## 2019-05-16 NOTE — Telephone Encounter (Signed)
Called patient to confirm patient procedure appointment and to remind patient that they have to go for a COVID test on 05/19/2019  Unable to leave a message sent mychart message

## 2019-05-19 ENCOUNTER — Other Ambulatory Visit: Payer: Self-pay

## 2019-05-19 ENCOUNTER — Other Ambulatory Visit
Admission: RE | Admit: 2019-05-19 | Discharge: 2019-05-19 | Disposition: A | Discharge status: Home / Self Care | Payer: Medicare HMO | Source: Ambulatory Visit | Attending: Gastroenterology | Admitting: Gastroenterology

## 2019-05-19 DIAGNOSIS — Z01812 Encounter for preprocedural laboratory examination: Secondary | ICD-10-CM | POA: Diagnosis present

## 2019-05-19 DIAGNOSIS — Z20828 Contact with and (suspected) exposure to other viral communicable diseases: Secondary | ICD-10-CM | POA: Diagnosis not present

## 2019-05-19 LAB — SARS CORONAVIRUS 2 (TAT 6-24 HRS): SARS Coronavirus 2: NEGATIVE

## 2019-05-22 ENCOUNTER — Encounter
Admission: RE | Disposition: A | Discharge status: Home / Self Care | Payer: Self-pay | Source: Home / Self Care | Attending: Gastroenterology

## 2019-05-22 ENCOUNTER — Encounter: Payer: Self-pay | Admitting: *Deleted

## 2019-05-22 ENCOUNTER — Ambulatory Visit: Payer: Medicare HMO | Admitting: Certified Registered"

## 2019-05-22 ENCOUNTER — Other Ambulatory Visit: Payer: Self-pay

## 2019-05-22 ENCOUNTER — Ambulatory Visit
Admission: RE | Admit: 2019-05-22 | Discharge: 2019-05-22 | Disposition: A | Discharge status: Home / Self Care | Payer: Medicare HMO | Attending: Gastroenterology | Admitting: Gastroenterology

## 2019-05-22 DIAGNOSIS — Z8249 Family history of ischemic heart disease and other diseases of the circulatory system: Secondary | ICD-10-CM | POA: Insufficient documentation

## 2019-05-22 DIAGNOSIS — K648 Other hemorrhoids: Secondary | ICD-10-CM | POA: Insufficient documentation

## 2019-05-22 DIAGNOSIS — N186 End stage renal disease: Secondary | ICD-10-CM | POA: Diagnosis not present

## 2019-05-22 DIAGNOSIS — I120 Hypertensive chronic kidney disease with stage 5 chronic kidney disease or end stage renal disease: Secondary | ICD-10-CM | POA: Insufficient documentation

## 2019-05-22 DIAGNOSIS — Z992 Dependence on renal dialysis: Secondary | ICD-10-CM | POA: Diagnosis not present

## 2019-05-22 DIAGNOSIS — Z9841 Cataract extraction status, right eye: Secondary | ICD-10-CM | POA: Diagnosis not present

## 2019-05-22 DIAGNOSIS — D631 Anemia in chronic kidney disease: Secondary | ICD-10-CM | POA: Insufficient documentation

## 2019-05-22 DIAGNOSIS — E1122 Type 2 diabetes mellitus with diabetic chronic kidney disease: Secondary | ICD-10-CM | POA: Insufficient documentation

## 2019-05-22 DIAGNOSIS — Z7982 Long term (current) use of aspirin: Secondary | ICD-10-CM | POA: Insufficient documentation

## 2019-05-22 DIAGNOSIS — Z961 Presence of intraocular lens: Secondary | ICD-10-CM | POA: Diagnosis not present

## 2019-05-22 DIAGNOSIS — K635 Polyp of colon: Secondary | ICD-10-CM

## 2019-05-22 DIAGNOSIS — D123 Benign neoplasm of transverse colon: Secondary | ICD-10-CM | POA: Insufficient documentation

## 2019-05-22 DIAGNOSIS — Z1211 Encounter for screening for malignant neoplasm of colon: Secondary | ICD-10-CM | POA: Diagnosis not present

## 2019-05-22 DIAGNOSIS — Z833 Family history of diabetes mellitus: Secondary | ICD-10-CM | POA: Insufficient documentation

## 2019-05-22 DIAGNOSIS — Z980 Intestinal bypass and anastomosis status: Secondary | ICD-10-CM | POA: Insufficient documentation

## 2019-05-22 DIAGNOSIS — Z85038 Personal history of other malignant neoplasm of large intestine: Secondary | ICD-10-CM | POA: Diagnosis not present

## 2019-05-22 DIAGNOSIS — K6389 Other specified diseases of intestine: Secondary | ICD-10-CM | POA: Insufficient documentation

## 2019-05-22 DIAGNOSIS — Z79899 Other long term (current) drug therapy: Secondary | ICD-10-CM | POA: Diagnosis not present

## 2019-05-22 DIAGNOSIS — Z9842 Cataract extraction status, left eye: Secondary | ICD-10-CM | POA: Diagnosis not present

## 2019-05-22 DIAGNOSIS — Z86010 Personal history of colonic polyps: Secondary | ICD-10-CM

## 2019-05-22 HISTORY — PX: COLONOSCOPY WITH PROPOFOL: SHX5780

## 2019-05-22 SURGERY — COLONOSCOPY WITH PROPOFOL
Anesthesia: General

## 2019-05-22 MED ORDER — PHENYLEPHRINE HCL (PRESSORS) 10 MG/ML IV SOLN
INTRAVENOUS | Status: AC
  Filled 2019-05-22: qty 1

## 2019-05-22 MED ORDER — SODIUM CHLORIDE 0.9 % IV SOLN
INTRAVENOUS | Status: DC
  Administered 2019-05-22: 1000 mL via INTRAVENOUS

## 2019-05-22 MED ORDER — PROPOFOL 10 MG/ML IV BOLUS
INTRAVENOUS | Status: DC | PRN
  Administered 2019-05-22: 30 mg via INTRAVENOUS
  Administered 2019-05-22 (×2): 20 mg via INTRAVENOUS

## 2019-05-22 MED ORDER — PROPOFOL 500 MG/50ML IV EMUL
INTRAVENOUS | Status: DC | PRN
  Administered 2019-05-22: 75 ug/kg/min via INTRAVENOUS

## 2019-05-22 MED ORDER — LIDOCAINE HCL (CARDIAC) PF 100 MG/5ML IV SOSY
PREFILLED_SYRINGE | INTRAVENOUS | Status: DC | PRN
  Administered 2019-05-22: 60 mg via INTRAVENOUS

## 2019-05-22 MED ORDER — PROPOFOL 500 MG/50ML IV EMUL
INTRAVENOUS | Status: AC
  Filled 2019-05-22: qty 50

## 2019-05-22 NOTE — Anesthesia Postprocedure Evaluation (Signed)
Anesthesia Post Note  [name]  Procedure(s) Performed: COLONOSCOPY WITH PROPOFOL (N/A )  Patient location during evaluation: Endoscopy Anesthesia Type: General Level of consciousness: awake and alert Pain management: pain level controlled Vital Signs Assessment: post-procedure vital signs reviewed and stable Respiratory status: spontaneous breathing, nonlabored ventilation, respiratory function stable and patient connected to nasal cannula oxygen Cardiovascular status: blood pressure returned to baseline and stable Postop Assessment: no apparent nausea or vomiting Anesthetic complications: no     Last Vitals:  Vitals:   05/22/19 0925 05/22/19 0928  BP: (!) 131/96 (!) 170/100  Pulse: 61 (!) 59  Resp: 13 12  Temp:    SpO2: 100% 99%    Last Pain:  Vitals:   05/22/19 0938  TempSrc:   PainSc: 0-No pain                 Martha Clan

## 2019-05-22 NOTE — Progress Notes (Signed)
831 reached end of his colon no cecum due to colon surgery

## 2019-05-22 NOTE — H&P (Signed)
Vonda Antigua, MD [address] [place], [place], [place], [address] [address], [place], [place], [place], [address] Phone: [phone]  Fax: [phone]  Primary Care Physician:  Gwynne Edinger, MD   Pre-Procedure History & Physical: HPI:  Jonathan Mcgrath is a 78 y.o. male is here for a colonoscopy.   Past Medical History:  Diagnosis Date  . Cancer ([place]) 2019   colon  . Chronic kidney disease   . Diabetes mellitus without complication (HCC)    diet controlled  . Hemodialysis access site with arteriovenous graft ([place])   . Hypertension   . Infection due to Clostridium septicum ([place]) 08/27/2018  . Renal insufficiency    Pt on dialysis x 3 years and receives every Monday, Wednesday and Friday.     Past Surgical History:  Procedure Laterality Date  . AV FISTULA PLACEMENT Left 2015   arm  . CATARACT EXTRACTION W/ INTRAOCULAR LENS  IMPLANT, BILATERAL Bilateral 08/2017  . CENTRAL LINE INSERTION-TUNNELED N/A 04/19/2018   Procedure: CENTRAL LINE INSERTION-TUNNELED;  Surgeon: Delana Meyer Dolores Lory, MD;  Location: [place] [facility];  Service: Cardiovascular;  Laterality: N/A;  . CENTRAL VENOUS CATHETER INSERTION  07/15/2018   Procedure: Insertion Hickman Catheter;  Surgeon: Algernon Huxley, MD;  Location: [place] [facility];  Service: Cardiovascular;;  . COLON RESECTION N/A 05/16/2018   Procedure: LAPAROSCOPIC  COLON RESECTION;  Surgeon: Herbert Pun, MD;  Location: [facility];  Service: General;  Laterality: N/A;  . COLONOSCOPY N/A 04/18/2018   Procedure: COLONOSCOPY;  Surgeon: Virgel Manifold, MD;  Location: [facility];  Service: Endoscopy;  Laterality: N/A;  . DIALYSIS/PERMA CATHETER REMOVAL N/A 06/19/2018   Procedure: DIALYSIS/PERMA CATHETER REMOVAL;  Surgeon: Algernon Huxley, MD;  Location: [place] [facility];  Service: Cardiovascular;  Laterality: N/A;  . DIALYSIS/PERMA CATHETER REMOVAL N/A 09/24/2018   Procedure: DIALYSIS/PERMA CATHETER REMOVAL;   Surgeon: Katha Cabal, MD;  Location: [place] [facility];  Service: Cardiovascular;  Laterality: N/A;  . ESOPHAGOGASTRODUODENOSCOPY N/A 04/18/2018   Procedure: ESOPHAGOGASTRODUODENOSCOPY (EGD);  Surgeon: Virgel Manifold, MD;  [facility] END[facility];  Service: Endoscopy;  Laterality: N/A;  . EYE SURGERY    . PELVIC ANGIOGRAPHY  07/15/2018   Procedure: PELVIC ANGIOGRAPHY;  Surgeon: Katha Cabal, MD;  Location: [place] [facility];  Service: Cardiovascular;;  . PERIPHERAL VASCULAR BALLOON ANGIOPLASTY Bilateral 07/15/2018   Procedure: PERIPHERAL VASCULAR BALLOON ANGIOPLASTY;  Surgeon: Algernon Huxley, MD;  Location: [place] [facility];  Service: Cardiovascular;  Laterality: Bilateral;  . PERIPHERAL VASCULAR CATHETERIZATION Left 01/12/2016   Procedure: A/V Shuntogram/Fistulagram;  Surgeon: Algernon Huxley, MD;  Location: [place] [facility];  Service: Cardiovascular;  Laterality: Left;  . PERIPHERAL VASCULAR CATHETERIZATION N/A 01/12/2016   Procedure: A/V Shunt Intervention;  Surgeon: Algernon Huxley, MD;  Location: [place] [facility];  Service: Cardiovascular;  Laterality: N/A;  . PERITONEAL CATHETER INSERTION N/A   . REMOVAL OF A DIALYSIS CATHETER N/A 01/14/2015   Procedure: REMOVAL OF A  PERITONEAL DIALYSIS CATHETER;  Surgeon: Algernon Huxley, MD;  Location: [facility];  Service: Vascular;  Laterality: N/A;  . TEE WITHOUT CARDIOVERSION N/A 04/19/2018   Procedure: TRANSESOPHAGEAL ECHOCARDIOGRAM (TEE);  Surgeon: Teodoro Spray, MD;  Location: [facility];  Service: Cardiovascular;  Laterality: N/A;    Prior to Admission medications   Medication Sig Start Date End Date Taking? Authorizing Provider  amLODipine (NORVASC) 5 MG tablet Take 1 tablet by mouth daily. 12/26/18   [provider]  amoxicillin-clavulanate (AUGMENTIN) 500-125 MG tablet Take 1 tablet (500 mg total) by mouth daily. 01/02/19   Tsosie Billing, MD  aspirin EC 81 MG EC tablet Take 1 tablet (81 mg total) by mouth  daily. Patient not taking: Reported on 01/02/2019 06/21/18   Dustin Flock, MD  carvedilol (COREG) 6.25 MG tablet Take 6.25 mg by mouth 2 (two) times daily with a meal.    [provider]  cinacalcet (SENSIPAR) 30 MG tablet Take 30 mg by mouth every morning.    [provider]  ferric citrate (AURYXIA) 1 GM 210 MG(Fe) tablet Take 210 mg by mouth daily.     [provider]  losartan (COZAAR) 50 MG tablet  11/12/18   [provider]  Multiple Vitamin (MULTI-VITAMIN) tablet Take by mouth.    [provider]  multivitamin (RENA-VIT) TABS tablet Take 1 tablet by mouth daily. 10/12/15   [provider]  ondansetron (ZOFRAN) 4 MG tablet Take 4 mg by mouth 3 (three) times daily as needed for nausea/vomiting. 06/07/18   [provider]    Allergies as of 05/14/2019  . (No Known Allergies)    Family History  Problem Relation Age of Onset  . Diabetes Mother   . Heart disease Father     Social History   Socioeconomic History  . Marital status: Widowed    Spouse name: Not on file  . Number of children: 4  . Years of education: Not on file  . Highest education level: Not on file  Occupational History  . Occupation: retired  Scientific laboratory technician  . Financial resource strain: Not on file  . Food insecurity    Worry: Not on file    Inability: Not on file  . Transportation needs    Medical: Not on file    Non-medical: Not on file  Tobacco Use  . Smoking status: Never Smoker  . Smokeless tobacco: Never Used  Substance and Sexual Activity  . Alcohol use: No  . Drug use: No  . Sexual activity: Not on file  Lifestyle  . Physical activity    Days per week: Not on file    Minutes per session: Not on file  . Stress: Not on file  Relationships  . Social Herbalist on phone: Not on file    Gets together: Not on file    Attends religious service: Not on file    Active member of club or organization: Not on file    Attends  meetings of clubs or organizations: Not on file    Relationship status: Not on file  . Intimate partner violence    Fear of current or ex partner: Not on file    Emotionally abused: Not on file    Physically abused: Not on file    Forced sexual activity: Not on file  Other Topics Concern  . Not on file  Social History Narrative  . Not on file    Review of Systems: See HPI, otherwise negative ROS  Physical Exam: BP (!) 133/55   Pulse 76   Temp (!) 97.1 F (36.2 C) (Tympanic)   Resp 18   Ht 6\' 1"  (1.854 m)   Wt 90.7 kg   SpO2 100%   BMI 26.39 kg/m  General:   Alert,  pleasant and cooperative in NAD Head:  Normocephalic and atraumatic. Neck:  Supple; no masses or thyromegaly. Lungs:  Clear throughout to auscultation, normal respiratory effort.    Heart:  +S1, +  S2, Regular rate and rhythm, No edema. Abdomen:  Soft, nontender and nondistended. Normal bowel sounds, without guarding, and without rebound.   Neurologic:  Alert and  oriented x4;  grossly normal neurologically.  Impression/Plan: Jonathan Mcgrath is here for a colonoscopy to be performed for history of colon cancer.   Risks, benefits, limitations, and alternatives regarding  colonoscopy have been reviewed with the patient.  Questions have been answered.  All parties agreeable.   Virgel Manifold, MD  05/22/2019, 8:17 AM

## 2019-05-22 NOTE — Op Note (Signed)
[facility] Gastroenterology Patient Name: Jonathan Mcgrath Procedure Date: 05/22/2019 7:33 AM MRN: [id number] Account #: [id number] Date of Birth: 1940/10/08 Admit Type: Outpatient Age: 78 Room: [facility] ENDO ROOM 3 Gender: Male Note Status: Finalized Procedure:             Colonoscopy Indications:           High risk colon cancer surveillance: Personal history                         of colon cancer Providers:             Reita Shindler B. Bonna Gains MD, MD Referring MD:          Lorella Nimrod Md, MD (Referring MD) Medicines:             Monitored Anesthesia Care Complications:         No immediate complications. Procedure:             Pre-Anesthesia Assessment:                        - ASA Grade Assessment: III - A patient with severe                         systemic disease.                        - Prior to the procedure, a History and Physical was                         performed, and patient medications, allergies and                         sensitivities were reviewed. The patient's tolerance                         of previous anesthesia was reviewed.                        - The risks and benefits of the procedure and the                         sedation options and risks were discussed with the                         patient. All questions were answered and informed                         consent was obtained.                        - Patient identification and proposed procedure were                         verified prior to the procedure by the physician, the                         nurse, the anesthesiologist, the anesthetist and the                         technician. The  procedure was verified in the                         procedure room.                        After obtaining informed consent, the colonoscope was                         passed under direct vision. Throughout the procedure,                         the patient's blood pressure, pulse, and  oxygen                         saturations were monitored continuously. The                         Colonoscope was introduced through the anus and                         advanced to the the ileocolonic anastomosis. The                         colonoscopy was performed with ease. The patient                         tolerated the procedure well. The quality of the bowel                         preparation was fair. Findings:      The perianal and digital rectal examinations were normal.      Two sessile polyps were found in the transverse colon. The polyps were 4       to 5 mm in size. These polyps were removed with a cold snare. Resection       and retrieval were complete.      Two sessile polyps were found in the transverse colon. The polyps were 2       mm in size. These polyps were removed with a cold biopsy forceps.       Resection and retrieval were complete.      An area of mildly thickened folds of the mucosa was found in the       transverse colon. Biopsies were taken with a cold forceps for histology.      The anastomosis appeared normal.      The exam was otherwise without abnormality.      Non-bleeding internal hemorrhoids were found during retroflexion. Impression:            - Preparation of the colon was fair.                        - Two 4 to 5 mm polyps in the transverse colon,                         removed with a cold snare. Resected and retrieved.                        - Two 2 mm polyps in the transverse colon, removed  with a cold biopsy forceps. Resected and retrieved.                        - Thickened folds of the mucosa in the transverse                         colon. Biopsied.                        - The examination was otherwise normal.                        - Non-bleeding internal hemorrhoids. Recommendation:        - Discharge patient to home (with escort).                        - Advance diet as tolerated.                         - Continue present medications.                        - Await pathology results.                        - Repeat colonoscopy in 2 years, with 2 day prep.                        - The findings and recommendations were discussed with                         the patient.                        - The findings and recommendations were discussed with                         the patient's family.                        - Return to primary care physician as previously                         scheduled. Procedure Code(s):     --- Professional ---                        [phone], Colonoscopy, flexible; with removal of                         tumor(s), polyp(s), or other lesion(s) by snare                         technique                        79892, 21, Colonoscopy, flexible; with biopsy, single                         or multiple Diagnosis Code(s):     --- Professional ---  Z85.038, Personal history of other malignant neoplasm                         of large intestine                        K64.8, Other hemorrhoids                        K63.5, Polyp of colon                        K63.89, Other specified diseases of intestine CPT copyright 2019 American Medical Association. All rights reserved. The codes documented in this report are preliminary and upon coder review may  be revised to meet current compliance requirements.  Vonda Antigua, MD Margretta Sidle B. Bonna Gains MD, MD 05/22/2019 9:30:29 AM This report has been signed electronically. Number of Addenda: 0 Note Initiated On: 05/22/2019 7:33 AM Scope Withdrawal Time: 0 hours 22 minutes 54 seconds  Total Procedure Duration: 0 hours 30 minutes 28 seconds  Estimated Blood Loss:  Estimated blood loss: none.      [facility]

## 2019-05-22 NOTE — Transfer of Care (Signed)
Immediate Anesthesia Transfer of Care Note  [name]  Procedure(s) Performed: COLONOSCOPY WITH PROPOFOL (N/A )  Patient Location: PACU  Anesthesia Type:General  Level of Consciousness: drowsy  Airway & Oxygen Therapy: Patient Spontanous Breathing  Post-op Assessment: Report given to RN and Post -op Vital signs reviewed and stable  Post vital signs: Reviewed and stable  Last Vitals:  Vitals Value Taken Time  BP 151/66 05/22/19 0858  Temp    Pulse 73 05/22/19 0859  Resp 2 05/22/19 0859  SpO2 100 % 05/22/19 0859  Vitals shown include unvalidated device data.  Last Pain:  Vitals:   05/22/19 0727  TempSrc: Tympanic  PainSc: 0-No pain         Complications: No apparent anesthesia complications

## 2019-05-22 NOTE — Anesthesia Preprocedure Evaluation (Signed)
Anesthesia Evaluation  Patient identified by MRN, date of birth, ID band Patient awake    Reviewed: Allergy & Precautions, NPO status , Patient's Chart, lab work & pertinent test results  History of Anesthesia Complications Negative for: history of anesthetic complications  Airway Mallampati: III  TM Distance: >3 FB Neck ROM: Full    Dental  (+) Partial Upper, Dental Advidsory Given   Pulmonary pneumonia, resolved,    Pulmonary exam normal breath sounds clear to auscultation       Cardiovascular Exercise Tolerance: Good hypertension, Pt. on medications negative cardio ROS Normal cardiovascular exam Rhythm:Regular Rate:Normal     Neuro/Psych neg Seizures  Neuromuscular disease negative psych ROS   GI/Hepatic negative GI ROS, Neg liver ROS,   Endo/Other  negative endocrine ROSdiabetes  Renal/GU ESRF and DialysisRenal disease  negative genitourinary   Musculoskeletal negative musculoskeletal ROS (+)   Abdominal   Peds negative pediatric ROS (+)  Hematology  (+) Blood dyscrasia, anemia ,   Anesthesia Other Findings Past Medical History: 2019: Cancer ([place])     Comment:  colon No date: Chronic kidney disease No date: Diabetes mellitus without complication (HCC)     Comment:  diet controlled No date: Hemodialysis access site with arteriovenous graft ([place]) No date: Hypertension 08/27/2018: Infection due to Clostridium septicum ([place]) No date: Renal insufficiency     Comment:  Pt on dialysis x 3 years and receives every Monday,               Wednesday and Friday.    Reproductive/Obstetrics negative OB ROS                             Anesthesia Physical  Anesthesia Plan  ASA: III  Anesthesia Plan: General   Post-op Pain Management:    Induction: Intravenous  PONV Risk Score and Plan: 2 and Propofol infusion and TIVA  Airway Management Planned: Natural Airway and Nasal  Cannula  Additional Equipment:   Intra-op Plan:   Post-operative Plan:   Informed Consent: I have reviewed the patients History and Physical, chart, labs and discussed the procedure including the risks, benefits and alternatives for the proposed anesthesia with the patient or authorized representative who has indicated his/her understanding and acceptance.     Dental advisory given  Plan Discussed with: CRNA and Surgeon  Anesthesia Plan Comments:         Anesthesia Quick Evaluation

## 2019-05-22 NOTE — Anesthesia Post-op Follow-up Note (Signed)
Anesthesia QCDR form completed.        

## 2019-05-23 ENCOUNTER — Encounter: Payer: Self-pay | Admitting: Gastroenterology

## 2019-05-23 LAB — SURGICAL PATHOLOGY

## 2019-05-26 ENCOUNTER — Encounter: Payer: Self-pay | Admitting: Gastroenterology

## 2019-07-08 ENCOUNTER — Ambulatory Visit: Payer: Medicare HMO | Admitting: Infectious Diseases

## 2019-07-29 ENCOUNTER — Encounter: Payer: Self-pay | Admitting: Infectious Diseases

## 2019-07-29 ENCOUNTER — Other Ambulatory Visit
Admission: RE | Admit: 2019-07-29 | Discharge: 2019-07-29 | Disposition: A | Discharge status: Home / Self Care | Payer: Medicare HMO | Source: Ambulatory Visit | Attending: Infectious Diseases | Admitting: Infectious Diseases

## 2019-07-29 ENCOUNTER — Ambulatory Visit: Payer: Medicare HMO | Attending: Infectious Diseases | Admitting: Infectious Diseases

## 2019-07-29 ENCOUNTER — Other Ambulatory Visit: Payer: Self-pay

## 2019-07-29 VITALS — BP 124/65 | HR 84 | Temp 97.9°F | Resp 16 | Ht 72.0 in | Wt 191.4 lb

## 2019-07-29 DIAGNOSIS — E1122 Type 2 diabetes mellitus with diabetic chronic kidney disease: Secondary | ICD-10-CM

## 2019-07-29 DIAGNOSIS — R7881 Bacteremia: Secondary | ICD-10-CM

## 2019-07-29 DIAGNOSIS — B9689 Other specified bacterial agents as the cause of diseases classified elsewhere: Secondary | ICD-10-CM

## 2019-07-29 DIAGNOSIS — I120 Hypertensive chronic kidney disease with stage 5 chronic kidney disease or end stage renal disease: Secondary | ICD-10-CM

## 2019-07-29 DIAGNOSIS — Z992 Dependence on renal dialysis: Secondary | ICD-10-CM

## 2019-07-29 DIAGNOSIS — C9110 Chronic lymphocytic leukemia of B-cell type not having achieved remission: Secondary | ICD-10-CM | POA: Diagnosis not present

## 2019-07-29 DIAGNOSIS — Z9049 Acquired absence of other specified parts of digestive tract: Secondary | ICD-10-CM

## 2019-07-29 DIAGNOSIS — I776 Arteritis, unspecified: Secondary | ICD-10-CM | POA: Diagnosis not present

## 2019-07-29 DIAGNOSIS — C180 Malignant neoplasm of cecum: Secondary | ICD-10-CM

## 2019-07-29 DIAGNOSIS — D696 Thrombocytopenia, unspecified: Secondary | ICD-10-CM

## 2019-07-29 DIAGNOSIS — Z95820 Peripheral vascular angioplasty status with implants and grafts: Secondary | ICD-10-CM

## 2019-07-29 DIAGNOSIS — Z792 Long term (current) use of antibiotics: Secondary | ICD-10-CM

## 2019-07-29 DIAGNOSIS — N186 End stage renal disease: Secondary | ICD-10-CM

## 2019-07-29 LAB — COMPREHENSIVE METABOLIC PANEL
ALT: 20 U/L (ref 0–44)
AST: 22 U/L (ref 15–41)
Albumin: 4.1 g/dL (ref 3.5–5.0)
Alkaline Phosphatase: 58 U/L (ref 38–126)
Anion gap: 12 (ref 5–15)
BUN: 37 mg/dL — ABNORMAL HIGH (ref 8–23)
CO2: 34 mmol/L — ABNORMAL HIGH (ref 22–32)
Calcium: 9.5 mg/dL (ref 8.9–10.3)
Chloride: 96 mmol/L — ABNORMAL LOW (ref 98–111)
Creatinine, Ser: 8.07 mg/dL — ABNORMAL HIGH (ref 0.61–1.24)
GFR calc Af Amer: 7 mL/min — ABNORMAL LOW (ref >60)
GFR calc non Af Amer: 6 mL/min — ABNORMAL LOW (ref >60)
Glucose, Bld: 127 mg/dL — ABNORMAL HIGH (ref 70–99)
Potassium: 4.4 mmol/L (ref 3.5–5.1)
Sodium: 142 mmol/L (ref 135–145)
Total Bilirubin: 0.9 mg/dL (ref 0.3–1.2)
Total Protein: 6.8 g/dL (ref 6.5–8.1)

## 2019-07-29 LAB — CBC WITH DIFFERENTIAL/PLATELET
Abs Immature Granulocytes: 0.04 10*3/uL (ref 0.00–0.07)
Basophils Absolute: 0 10*3/uL (ref 0.0–0.1)
Basophils Relative: 0 %
Eosinophils Absolute: 0.1 10*3/uL (ref 0.0–0.5)
Eosinophils Relative: 0 %
HCT: 40.7 % (ref 39.0–52.0)
Hemoglobin: 13.4 g/dL (ref 13.0–17.0)
Immature Granulocytes: 0 %
Lymphocytes Relative: 84 %
Lymphs Abs: 19 10*3/uL — ABNORMAL HIGH (ref 0.7–4.0)
MCH: 31.4 pg (ref 26.0–34.0)
MCHC: 32.9 g/dL (ref 30.0–36.0)
MCV: 95.3 fL (ref 80.0–100.0)
Monocytes Absolute: 0.7 10*3/uL (ref 0.1–1.0)
Monocytes Relative: 3 %
Neutro Abs: 2.8 10*3/uL (ref 1.7–7.7)
Neutrophils Relative %: 13 %
Platelets: 109 10*3/uL — ABNORMAL LOW (ref 150–400)
RBC: 4.27 MIL/uL (ref 4.22–5.81)
RDW: 14.2 % (ref 11.5–15.5)
Smear Review: NORMAL
WBC: 22.7 10*3/uL — ABNORMAL HIGH (ref 4.0–10.5)
nRBC: 0 % (ref 0.0–0.2)

## 2019-07-29 LAB — C-REACTIVE PROTEIN: CRP: <0.5 mg/dL (ref <1.0)

## 2019-07-29 LAB — SEDIMENTATION RATE: Sed Rate: 4 mm/hr (ref 0–20)

## 2019-07-29 NOTE — Patient Instructions (Addendum)
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 Please continue to take it indefinitely- today will do labs You can get the corona vaccine at   [facility], off of [address] in [place].   7:00am-4:00pm Monday thru Friday   Currently we are vaccinating 1B Part 1 (10 and older)   [facility] is utilizing a drive-thru method in order to serve folks as safely and quickly as possible.   individuals are to register onsite at this time.

## 2019-07-29 NOTE — Progress Notes (Signed)
NAME: Jonathan Mcgrath  DOB: 09-14-1940  MRN: [id number]  Date/Time: 07/29/2019 9:46 AM  Subjective:   Follow up visit for aortitis  Due to Clostridium septicum ? Jonathan Mcgrath is a 79 y.o. 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 Pt is doing well No pain abdomen No fever or chills No diarrhea appetiie good Lives on his own [place] for dialysis  Past Medical History:  Diagnosis Date  . Cancer ([place]) 2019   colon  . Chronic kidney disease   . Diabetes mellitus without complication (HCC)    diet controlled  . Hemodialysis access site with arteriovenous graft ([place])   . Hypertension   . 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 Renal insufficiency    Pt on dialysis x 3 years and receives every Monday, Wednesday and Friday.   CLL   Past Surgical History:  Procedure Laterality Date  . AV FISTULA PLACEMENT Left 2015   arm  . CATARACT EXTRACTION W/ INTRAOCULAR LENS  IMPLANT, BILATERAL Bilateral 08/2017  . CENTRAL LINE INSERTION-TUNNELED N/A 04/19/2018   Procedure: CENTRAL LINE INSERTION-TUNNELED;  Surgeon: Delana Meyer Dolores Lory, MD;  Location: [place] [facility];  Service: Cardiovascular;  Laterality: N/A;  . CENTRAL VENOUS CATHETER INSERTION  07/15/2018   Procedure: Insertion Hickman Catheter;  Surgeon: Algernon Huxley, MD;  Location: [place] [facility];  Service: Cardiovascular;;  . COLON RESECTION N/A 05/16/2018   Procedure: LAPAROSCOPIC  COLON RESECTION;  Surgeon: Herbert Pun, MD;  Location: [facility] ORS;  Service: General;  Laterality: N/A;  . COLONOSCOPY N/A 04/18/2018   Procedure: COLONOSCOPY;  Surgeon: Virgel Manifold, MD;  Location: [facility] ENDOSCOPY;  Service: Endoscopy;   Laterality: N/A;  . COLONOSCOPY WITH PROPOFOL N/A 05/22/2019   Procedure: COLONOSCOPY WITH PROPOFOL;  Surgeon: Virgel Manifold, MD;  Location: [facility] ENDOSCOPY;  Service: Endoscopy;  Laterality: N/A;  . DIALYSIS/PERMA CATHETER REMOVAL N/A 06/19/2018   Procedure: DIALYSIS/PERMA CATHETER REMOVAL;  Surgeon: Algernon Huxley, MD;  Location: [place] [facility];  Service: Cardiovascular;  Laterality: N/A;  . DIALYSIS/PERMA CATHETER REMOVAL N/A 09/24/2018   Procedure: DIALYSIS/PERMA CATHETER REMOVAL;  Surgeon: Katha Cabal, MD;  Location: [place] [facility];  Service: Cardiovascular;  Laterality: N/A;  . ESOPHAGOGASTRODUODENOSCOPY N/A 04/18/2018   Procedure: ESOPHAGOGASTRODUODENOSCOPY (EGD);  Surgeon: Virgel Manifold, MD;  Location: [facility] ENDOSCOPY;  Service: Endoscopy;  Laterality: N/A;  . EYE SURGERY    . PELVIC ANGIOGRAPHY  07/15/2018   Procedure: PELVIC ANGIOGRAPHY;  Surgeon: Katha Cabal, MD;  Location: [place] [facility];  Service: Cardiovascular;;  . PERIPHERAL VASCULAR BALLOON ANGIOPLASTY Bilateral 07/15/2018   Procedure: PERIPHERAL VASCULAR BALLOON ANGIOPLASTY;  Surgeon: Algernon Huxley, MD;  Location: [place] [facility];  Service: Cardiovascular;  Laterality: Bilateral;  . PERIPHERAL VASCULAR CATHETERIZATION Left 01/12/2016   Procedure: A/V Shuntogram/Fistulagram;  Surgeon: Algernon Huxley, MD;  Location: [place] [facility];  Service: Cardiovascular;  Laterality: Left;  . PERIPHERAL VASCULAR CATHETERIZATION N/A 01/12/2016   Procedure: A/V Shunt Intervention;  Surgeon: Algernon Huxley, MD;  Location: [place] [facility];  Service: Cardiovascular;  Laterality: N/A;  . PERITONEAL CATHETER INSERTION N/A   . REMOVAL OF A DIALYSIS CATHETER N/A 01/14/2015   Procedure: REMOVAL OF A  PERITONEAL DIALYSIS CATHETER;  Surgeon: Algernon Huxley, MD;  Location: [facility] ORS;  Service: Vascular;  Laterality: N/A;  . TEE WITHOUT CARDIOVERSION N/A 04/19/2018   Procedure: TRANSESOPHAGEAL ECHOCARDIOGRAM (TEE);   Surgeon: Teodoro Spray, MD;  Location: [facility];  Service: Cardiovascular;  Laterality: N/A;    Social History   Socioeconomic History  . Marital status: Widowed    Spouse name: Not on file  . Number of children: 4  . Years of education: Not on file  . Highest education level: Not on file  Occupational History  . Occupation: retired  Tobacco Use  . Smoking status: Never Smoker  . Smokeless tobacco: Never Used  Substance and Sexual Activity  . Alcohol use: No  . Drug use: No  . Sexual activity: Not on file  Other Topics Concern  . Not on file  Social History Narrative  . Not on file   Social Determinants of Health   Financial Resource Strain:   . Difficulty of Paying Living Expenses: Not on file  Food Insecurity:   . Worried About Charity fundraiser in the Last Year: Not on file  . Ran Out of Food in the Last Year: Not on file  Transportation Needs:   . Lack of Transportation (Medical): Not on file  . Lack of Transportation (Non-Medical): Not on file  Physical Activity:   . Days of Exercise per Week: Not on file  . Minutes of Exercise per Session: Not on file  Stress:   . Feeling of Stress : Not on file  Social Connections:   . Frequency of Communication with Friends and Family: Not on file  . Frequency of Social Gatherings with Friends and Family: Not on file  . Attends Religious Services: Not on file  . Active Member of Clubs or Organizations: Not on file  . Attends Archivist Meetings: Not on file  . Marital Status: Not on file  Intimate Partner Violence:   . Fear of Current or Ex-Partner: Not on file  . Emotionally Abused: Not on file  . Physically Abused: Not on file  . Sexually Abused: Not on file    Family History  Problem Relation Age of Onset  . Diabetes Mother   . Heart disease Father    No Known Allergies  ? Current Outpatient Medications  Medication Sig Dispense Refill  . amLODipine (NORVASC) 5 MG tablet Take 1 tablet by mouth  daily.    Marland Kitchen amoxicillin-clavulanate (AUGMENTIN) 500-125 MG tablet Take 1 tablet (500 mg total) by mouth daily. 30 tablet 6  . aspirin EC 81 MG EC tablet Take 1 tablet (81 mg total) by mouth daily.    . multivitamin (RENA-VIT) TABS tablet Take 1 tablet by mouth daily.  11  . ondansetron (ZOFRAN) 4 MG tablet Take 4 mg by mouth 3 (three) times daily as needed for nausea/vomiting.    . ferric citrate (AURYXIA) 1 GM 210 MG(Fe) tablet Take 210 mg by mouth daily.      No current facility-administered medications for this visit.     Abtx:  Anti-infectives (From admission, onward)   None      REVIEW OF SYSTEMS:  Const: negative fever, negative chills, negative weight loss Eyes: negative diplopia or visual changes, negative eye pain ENT: negative coryza, negative sore throat, hard of hearing Resp: negative cough, hemoptysis, dyspnea Cards: negative for chest pain, palpitations, lower extremity edema GU: negative for frequency, dysuria and hematuria GI: Negative for abdominal pain, diarrhea, bleeding, constipation Skin: negative for rash  and pruritus Heme: negative for easy bruising and gum/nose bleeding MS: negative for myalgias, arthralgias, back pain and muscle weakness Neurolo:negative for headaches, dizziness, vertigo, memory problems  Psych: negative for feelings of anxiety, depression  Endocrine: negative for thyroid, diabetes Allergy/Immunology- negative for any medication or food allergies ?  Objective:  VITALS:  BP 124/65   Pulse 84   Temp 97.9 F (36.6 C)   Resp 16   Ht 6' (1.829 m)   Wt 191 lb 6.4 oz (86.8 kg)   SpO2 94%   BMI 25.96 kg/m  PHYSICAL EXAM:  General: Alert, cooperative, no distress, appears stated age.  Head: Normocephalic, without obvious abnormality, atraumatic. Eyes: Conjunctivae clear, anicteric sclerae. Pupils are equal ENT Nares normal. No drainage or sinus tenderness. Lips, mucosa, and tongue normal. No Thrush Neck: Supple, symmetrical, no  adenopathy, thyroid: non tender no carotid bruit and no JVD. Back: No CVA tenderness. Lungs: Clear to auscultation bilaterally. No Wheezing or Rhonchi. No rales. Heart: Regular rate and rhythm, no murmur, rub or gallop. Abdomen: Soft, non-tender,not distended. Bowel sounds normal. No masses Extremities: atraumatic, no cyanosis. No edema. No clubbing Skin: No rashes or lesions. Or bruising Lymph: Cervical, supraclavicular normal. Neurologic: Grossly non-focal Pertinent Labs Lab Results CBC Latest Ref Rng & Units 07/29/2019 01/02/2019 07/18/2018  WBC 4.0 - 10.5 K/uL 22.7(H) 14.9(H) 17.0(H)  Hemoglobin 13.0 - 17.0 g/dL 13.4 12.0(L) 8.7(L)  Hematocrit 39.0 - 52.0 % 40.7 36.6(L) 26.9(L)  Platelets 150 - 400 K/uL 109(L) 83(L) 180   CMP Latest Ref Rng & Units 07/29/2019 01/02/2019 07/17/2018  Glucose 70 - 99 mg/dL 127(H) 89 144(H)  BUN 8 - 23 mg/dL 37(H) 46(H) 53(H)  Creatinine 0.61 - 1.24 mg/dL 8.07(H) 6.39(H) 7.90(H)  Sodium 135 - 145 mmol/L 142 143 140  Potassium 3.5 - 5.1 mmol/L 4.4 4.4 3.6  Chloride 98 - 111 mmol/L 96(L) 104 102  CO2 22 - 32 mmol/L 34(H) 30 26  Calcium 8.9 - 10.3 mg/dL 9.5 9.1 7.8(L)  Total Protein 6.5 - 8.1 g/dL 6.8 6.5 -  Total Bilirubin 0.3 - 1.2 mg/dL 0.9 0.7 -  Alkaline Phos 38 - 126 U/L 58 84 -  AST 15 - 41 U/L 22 27 -  ALT 0 - 44 U/L 20 38 -     Microbiology: No results found for this or any previous visit (from the past 240 hour(s)).  IMAGING RESULTS: None   ? 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  secondary to adenocarcinoma of the caecum Pt on indefinite PO augmentin after a prolonged course of IV in 2019 and early 2020 Much improved- inflammatory markers have normalized   s/p rt hemicolectomy and primary anastomosis 05/16/18 for ca cecum Recent colonoscopy 05/22/2019 -a few  polyps but no malignancy or recurrence at the anastomosis site  Leucocytosis since 2013- absolute lymphocytosis- has a diagnosis of CLL (cannot see those notes in Epic as it was in 2014) May have to follow up with heme/onc  Thrombocytopenia since 2013 likely due to the above Fluctuates- when he had the aortitis it had normalized due to the infection  Anemia - resolved  Hypoalbuminemia resolved Normal LFTS  Continue augmentin 519m once a day  ESR Normal 4 CRP pending    ?Counseled him about corona virus vaccine and risks and benefits and gave him the information to get it as he  is eligible. ? ___________________________________________________ Discussed with patient Follow up 6 months

## 2019-08-15 ENCOUNTER — Telehealth: Payer: Self-pay | Admitting: Infectious Diseases

## 2019-08-15 NOTE — Telephone Encounter (Signed)
08/15/19 Told him that his latest labs showed normal CRP and normal ESR. 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

## 2019-08-28 DIAGNOSIS — R770 Abnormality of albumin: Secondary | ICD-10-CM | POA: Insufficient documentation

## 2019-11-18 ENCOUNTER — Ambulatory Visit: Payer: Medicare HMO | Attending: Nephrology | Admitting: Physical Therapy

## 2019-11-18 ENCOUNTER — Other Ambulatory Visit: Payer: Self-pay

## 2019-11-18 DIAGNOSIS — R269 Unspecified abnormalities of gait and mobility: Secondary | ICD-10-CM | POA: Diagnosis not present

## 2019-11-18 DIAGNOSIS — R2689 Other abnormalities of gait and mobility: Secondary | ICD-10-CM | POA: Insufficient documentation

## 2019-11-21 NOTE — Therapy (Addendum)
[facility] [address] [place], [place], [address] Phone: [phone]   Fax:  [phone]  Physical Therapy Evaluation  Patient Details  Name: Jonathan Mcgrath MRN: [id number] Date of Birth: 1940/09/09 Referring Provider (PT): Dr. Anthonette Legato   Encounter Date: 11/18/2019    PT End of Session - 11/21/19 1811    Visit Number  1    Number of Visits  5    Date for PT Re-Evaluation  12/16/19    Authorization - Visit Number  1    Authorization - Number of Visits  10    PT Start Time  0940    PT Stop Time  1036    PT Time Calculation (min)  56 min    Activity Tolerance  Patient tolerated treatment well    Behavior During Therapy  [facility] for tasks assessed/performed          Past Medical History:  Diagnosis Date  . Cancer ([place]) 2019   colon  . Chronic kidney disease   . Diabetes mellitus without complication (HCC)    diet controlled  . Hemodialysis access site with arteriovenous graft ([place])   . Hypertension   . Infection due to Clostridium septicum ([place]) 08/27/2018  . Renal insufficiency    Pt on dialysis x 3 years and receives every Monday, Wednesday and Friday.     Past Surgical History:  Procedure Laterality Date  . AV FISTULA PLACEMENT Left 2015   arm  . CATARACT EXTRACTION W/ INTRAOCULAR LENS  IMPLANT, BILATERAL Bilateral 08/2017  . CENTRAL LINE INSERTION-TUNNELED N/A 04/19/2018   Procedure: CENTRAL LINE INSERTION-TUNNELED;  Surgeon: Delana Meyer Dolores Lory, MD;  Location: [place] [facility];  Service: Cardiovascular;  Laterality: N/A;  . CENTRAL VENOUS CATHETER INSERTION  07/15/2018   Procedure: Insertion Hickman Catheter;  Surgeon: Algernon Huxley, MD;  Location: [place] [facility];  Service: Cardiovascular;;  . COLON RESECTION N/A 05/16/2018   Procedure: LAPAROSCOPIC  COLON RESECTION;  Surgeon: Herbert Pun, MD;  Location: [facility];  Service: General;  Laterality: N/A;  . COLONOSCOPY N/A 04/18/2018    Procedure: COLONOSCOPY;  Surgeon: Virgel Manifold, MD;  Location: [facility] ENDOSCOPY;  Service: Endoscopy;  Laterality: N/A;  . COLONOSCOPY WITH PROPOFOL N/A 05/22/2019   Procedure: COLONOSCOPY WITH PROPOFOL;  Surgeon: Virgel Manifold, MD;  Location: [facility] ENDOSCOPY;  Service: Endoscopy;  Laterality: N/A;  . DIALYSIS/PERMA CATHETER REMOVAL N/A 06/19/2018   Procedure: DIALYSIS/PERMA CATHETER REMOVAL;  Surgeon: Algernon Huxley, MD;  Location: [place] [facility];  Service: Cardiovascular;  Laterality: N/A;  . DIALYSIS/PERMA CATHETER REMOVAL N/A 09/24/2018   Procedure: DIALYSIS/PERMA CATHETER REMOVAL;  Surgeon: Katha Cabal, MD;  Location: [place] [facility];  Service: Cardiovascular;  Laterality: N/A;  . ESOPHAGOGASTRODUODENOSCOPY N/A 04/18/2018   Procedure: ESOPHAGOGASTRODUODENOSCOPY (EGD);  Surgeon: Virgel Manifold, MD;  Location: [facility] ENDOSCOPY;  Service: Endoscopy;  Laterality: N/A;  . EYE SURGERY    . PELVIC ANGIOGRAPHY  07/15/2018   Procedure: PELVIC ANGIOGRAPHY;  Surgeon: Katha Cabal, MD;  Location: [place] [facility];  Service: Cardiovascular;;  . PERIPHERAL VASCULAR BALLOON ANGIOPLASTY Bilateral 07/15/2018   Procedure: PERIPHERAL VASCULAR BALLOON ANGIOPLASTY;  Surgeon: Algernon Huxley, MD;  Location: [place] [facility];  Service: Cardiovascular;  Laterality: Bilateral;  . PERIPHERAL VASCULAR CATHETERIZATION Left 01/12/2016   Procedure: A/V Shuntogram/Fistulagram;  Surgeon: Algernon Huxley, MD;  Location: [place] [facility];  Service: Cardiovascular;  Laterality: Left;  . PERIPHERAL VASCULAR CATHETERIZATION N/A  01/12/2016   Procedure: A/V Shunt Intervention;  Surgeon: Algernon Huxley, MD;  Location: [place] [facility];  Service: Cardiovascular;  Laterality: N/A;  . PERITONEAL CATHETER INSERTION N/A   . REMOVAL OF A DIALYSIS CATHETER N/A 01/14/2015   Procedure: REMOVAL OF A  PERITONEAL DIALYSIS CATHETER;  Surgeon: Algernon Huxley, MD;  Location: [facility];  Service: Vascular;   Laterality: N/A;  . TEE WITHOUT CARDIOVERSION N/A 04/19/2018   Procedure: TRANSESOPHAGEAL ECHOCARDIOGRAM (TEE);  Surgeon: Teodoro Spray, MD;  Location: [facility];  Service: Cardiovascular;  Laterality: N/A;    There were no vitals filed for this visit.   Subjective Assessment - 11/23/19 0652    Subjective  Pt. states he was referred to PT due to gait/ balance issues secondary to LE neuropathy.  Pt. reports no falls and no c/o back pain.    Pertinent History  Pt. enjoys fishing and lives alone.  Pt. has been receiving dialysis for the past 5 years.  Pt. still drives and has been mod. independent with use of SPC for daily activities.    Limitations  Standing;Walking;House hold activities    Patient Stated Goals  Improve balance/ independence with gait.    Currently in Pain?  No/denies         [facility] PT Assessment - 11/23/19 0001      Assessment   Medical Diagnosis  Imbalance and gait disturbance due to neuropathy/ End stage renal disease    Referring Provider (PT)  Dr. Anthonette Legato    Onset Date/Surgical Date  07/18/19    Prior Therapy  no      Precautions   Precautions  Fall      Restrictions   Weight Bearing Restrictions  No      Balance Screen   Has the patient fallen in the past 6 months  No      [place] residence      Prior Function   Level of Independence  Independent with household mobility with device      Cognition   Overall Cognitive Status  Within Functional Limits for tasks assessed         See flowsheet    Objective measurements completed on examination: See above findings.      PT will issue HEP next tx. Session.     PT Long Term Goals - 11/23/19 0715      PT LONG TERM GOAL #1   Title  Pt. will increase FOTO to 71 to improve functional mobility.    Baseline  Initial FOTO: 58.    Time  4    Period  Weeks    Status  New    Target Date  12/16/19      PT LONG TERM GOAL #2   Title  Pt. will increase  Berg balance test to >50 to improve safety/ decrease fall risk with daily walking.    Baseline  Initial Berg: 45/56.  Pt. will occasionally use SPC with gait.   No recent falls.    Time  4    Period  Weeks    Status  New    Target Date  12/16/19      PT LONG TERM GOAL #3   Title  Pt. able to ambulates with mod. independence and least assistive device on level surfaces with more normalized gait pattern to improve mobility.    Baseline  Inconsistent gait pattern with/ without use of SPC.  Time  4    Period  Weeks    Status  New    Target Date  12/16/19      PT LONG TERM GOAL #4   Title  Pt. able to complete 30 minute exercise program with no loss of balance and good endurance to improve functional mobility.    Baseline  pt. currently not participating with exercise program.    Time  4    Period  Weeks    Status  New    Target Date  12/16/19         Plan - 11/23/19 0707    Clinical Impression Statement  Pt. is a pleasant 79 y/o male with diagnosis of end-stage renal disease and B LE neuropathy resulting in gait disturbance/ balance issues.  Pt. reports no pain and no recent falls.  Pt. presents with excellent B LE AROM/ muscle strength (grossly 5/5 MMT).  Pt. has fair sensation in B lower legs/ feet.  Pt. currently not participating with walking or exercise program.  Pt. has good family support and continues to drive.  FOTO: initial 58/ goal 71.  Berg balance test: 45/56 (increase fall risk).  Pt. ambulates with slight ataxic gait pattern without use of SPC on level surfaces.  Pt. benefit from use of SPC and requires instruction in proper 2-point gait pattern.  Pt. will benefit from short-term skilled PT services to develop exercise program to improve balance and safety with walking.    Stability/Clinical Decision Making  Evolving/Moderate complexity    Clinical Decision Making  Moderate    Rehab Potential  Fair    PT Frequency  1x / week    PT Duration  4 weeks    PT  Treatment/Interventions  ADLs/Self Care Home Management;Electrical Stimulation;Moist Heat;Cryotherapy;Functional mobility training;Stair training;Gait training;DME Instruction;Therapeutic exercise;Therapeutic activities;Balance training;Neuromuscular re-education;Manual techniques;Patient/family education    PT Next Visit Plan  Issue HEP next tx. session/  dynamic balance tasks.       Patient will benefit from skilled therapeutic intervention in order to improve the following deficits and impairments:  Abnormal gait, Postural dysfunction, Decreased coordination, Decreased activity tolerance, Decreased endurance, Decreased range of motion, Decreased strength, Difficulty walking, Decreased balance  Visit Diagnosis: Gait difficulty  Imbalance     Problem List Patient Active Problem List   Diagnosis Date Noted  . Personal history of colon cancer   . Polyp of colon   . Infection due to Clostridium septicum ([place]) 08/27/2018  . 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 Acute hypoxemic respiratory failure ([place]) 06/16/2018  . Colon cancer ([place]) 05/16/2018  . Adenomatous polyp of colon 05/03/2018  . Malignant neoplasm of ascending colon ([place]) 05/03/2018  . Benign neoplasm of ascending colon   . Benign neoplasm of transverse colon   . Polyp of sigmoid colon   . Colon neoplasm   . Mass of cecum   . Diarrhea   . Stomach irritation   . Abdominal pain 04/12/2018  . CAP (community acquired pneumonia) 08/17/2016  . ESRD on peritoneal dialysis ([place]) 07/13/2014  . Essential hypertension 07/13/2014  . Type 2 diabetes mellitus with stage 5 chronic kidney disease ([place]) 07/13/2014  . Diabetic peripheral neuropathy associated with type 2 diabetes mellitus ([place]) 07/13/2014  . Vitreous hemorrhage, right eye ([place]) 03/02/2014  . Diabetic retinopathy ([place]) 03/02/2014   Pura Spice, PT, DPT # [phone] 11/23/2019, 7:20 AM  [facility] [address] [place], [place],  [address] Phone: [phone]   Fax:  [phone]  Name: LAVELL SUPPLE MRN: [id number] Date of Birth: 08-16-1940

## 2019-11-23 NOTE — Addendum Note (Signed)
Addended by: Pura Spice on: 11/23/2019 07:23 AM   Modules accepted: Orders

## 2019-11-25 ENCOUNTER — Encounter: Payer: Self-pay | Admitting: Physical Therapy

## 2019-11-25 ENCOUNTER — Other Ambulatory Visit: Payer: Self-pay

## 2019-11-25 ENCOUNTER — Ambulatory Visit: Payer: Medicare HMO | Admitting: Physical Therapy

## 2019-11-25 DIAGNOSIS — R269 Unspecified abnormalities of gait and mobility: Secondary | ICD-10-CM

## 2019-11-25 DIAGNOSIS — R2689 Other abnormalities of gait and mobility: Secondary | ICD-10-CM

## 2019-11-25 NOTE — Therapy (Signed)
[facility] [address] [place], [place], [address] Phone: [phone]   Fax:  [phone]  Physical Therapy Treatment  Patient Details  Name: Jonathan Mcgrath MRN: [id number] Date of Birth: 08/07/1940 Referring Provider (PT): Dr. Anthonette Legato   Encounter Date: 11/25/2019  PT End of Session - 11/25/19 0932    Visit Number  2    Number of Visits  5    Date for PT Re-Evaluation  12/16/19    Authorization - Visit Number  2    Authorization - Number of Visits  10    PT Start Time  0924    PT Stop Time  1003    PT Time Calculation (min)  39 min    Activity Tolerance  Patient tolerated treatment well    Behavior During Therapy  [facility] for tasks assessed/performed       Past Medical History:  Diagnosis Date  . Cancer ([place]) 2019   colon  . Chronic kidney disease   . Diabetes mellitus without complication (HCC)    diet controlled  . Hemodialysis access site with arteriovenous graft ([place])   . Hypertension   . Infection due to Clostridium septicum ([place]) 08/27/2018  . Renal insufficiency    Pt on dialysis x 3 years and receives every Monday, Wednesday and Friday.     Past Surgical History:  Procedure Laterality Date  . AV FISTULA PLACEMENT Left 2015   arm  . CATARACT EXTRACTION W/ INTRAOCULAR LENS  IMPLANT, BILATERAL Bilateral 08/2017  . CENTRAL LINE INSERTION-TUNNELED N/A 04/19/2018   Procedure: CENTRAL LINE INSERTION-TUNNELED;  Surgeon: Delana Meyer Dolores Lory, MD;  Location: [place] [facility];  Service: Cardiovascular;  Laterality: N/A;  . CENTRAL VENOUS CATHETER INSERTION  07/15/2018   Procedure: Insertion Hickman Catheter;  Surgeon: Algernon Huxley, MD;  Location: [place] [facility];  Service: Cardiovascular;;  . COLON RESECTION N/A 05/16/2018   Procedure: LAPAROSCOPIC  COLON RESECTION;  Surgeon: Herbert Pun, MD;  Location: [facility];  Service: General;  Laterality: N/A;  . COLONOSCOPY N/A 04/18/2018   Procedure:  COLONOSCOPY;  Surgeon: Virgel Manifold, MD;  Location: [facility] ENDOSCOPY;  Service: Endoscopy;  Laterality: N/A;  . COLONOSCOPY WITH PROPOFOL N/A 05/22/2019   Procedure: COLONOSCOPY WITH PROPOFOL;  Surgeon: Virgel Manifold, MD;  Location: [facility] ENDOSCOPY;  Service: Endoscopy;  Laterality: N/A;  . DIALYSIS/PERMA CATHETER REMOVAL N/A 06/19/2018   Procedure: DIALYSIS/PERMA CATHETER REMOVAL;  Surgeon: Algernon Huxley, MD;  Location: [place] [facility];  Service: Cardiovascular;  Laterality: N/A;  . DIALYSIS/PERMA CATHETER REMOVAL N/A 09/24/2018   Procedure: DIALYSIS/PERMA CATHETER REMOVAL;  Surgeon: Katha Cabal, MD;  Location: [place] [facility];  Service: Cardiovascular;  Laterality: N/A;  . ESOPHAGOGASTRODUODENOSCOPY N/A 04/18/2018   Procedure: ESOPHAGOGASTRODUODENOSCOPY (EGD);  Surgeon: Virgel Manifold, MD;  Location: [facility] ENDOSCOPY;  Service: Endoscopy;  Laterality: N/A;  . EYE SURGERY    . PELVIC ANGIOGRAPHY  07/15/2018   Procedure: PELVIC ANGIOGRAPHY;  Surgeon: Katha Cabal, MD;  Location: [place] [facility];  Service: Cardiovascular;;  . PERIPHERAL VASCULAR BALLOON ANGIOPLASTY Bilateral 07/15/2018   Procedure: PERIPHERAL VASCULAR BALLOON ANGIOPLASTY;  Surgeon: Algernon Huxley, MD;  Location: [place] [facility];  Service: Cardiovascular;  Laterality: Bilateral;  . PERIPHERAL VASCULAR CATHETERIZATION Left 01/12/2016   Procedure: A/V Shuntogram/Fistulagram;  Surgeon: Algernon Huxley, MD;  Location: [place] [facility];  Service: Cardiovascular;  Laterality: Left;  . PERIPHERAL VASCULAR CATHETERIZATION N/A 01/12/2016   Procedure: A/V  Shunt Intervention;  Surgeon: Algernon Huxley, MD;  Location: [place] [facility];  Service: Cardiovascular;  Laterality: N/A;  . PERITONEAL CATHETER INSERTION N/A   . REMOVAL OF A DIALYSIS CATHETER N/A 01/14/2015   Procedure: REMOVAL OF A  PERITONEAL DIALYSIS CATHETER;  Surgeon: Algernon Huxley, MD;  Location: [facility];  Service: Vascular;  Laterality: N/A;   . TEE WITHOUT CARDIOVERSION N/A 04/19/2018   Procedure: TRANSESOPHAGEAL ECHOCARDIOGRAM (TEE);  Surgeon: Teodoro Spray, MD;  Location: [facility];  Service: Cardiovascular;  Laterality: N/A;    There were no vitals filed for this visit.  Subjective Assessment - 11/25/19 0925    Subjective  Pts. daughter purchased foot vibration machine for pt. to treat neuropathy.  Pt. states it feels like he is walking on cotton.    Pertinent History  Pt. enjoys fishing and lives alone.  Pt. has been receiving dialysis for the past 5 years.  Pt. still drives and has been mod. independent with use of SPC for daily activities.    Limitations  Standing;Walking;House hold activities    Patient Stated Goals  Improve balance/ independence with gait.    Currently in Pain?  No/denies            Discussed HEP  Neuro.:  Tandem stance/ gait in //-bars (requires UE assist for safety/ stability) Walking in PT clinic/ outside working on maintaining consistent BOS/ step pattern/ heel strike.  2 episodes of staggering/ scissoring gait but pt. Able to self-correct independently.  Nustep L4 10 min. (at end of tx.)- B UE/LE while reviewing HEP  Manual tx.:  Supine L/R gastroc/ ankle stretches (all planes)- 3x each with static holds as tolerated. Supine STM to B gastroc/ feet manually and addition of Hypervolt (8 min.)    PT Long Term Goals - 11/23/19 0715      PT LONG TERM GOAL #1   Title  Pt. will increase FOTO to 71 to improve functional mobility.    Baseline  Initial FOTO: 58.    Time  4    Period  Weeks    Status  New    Target Date  12/16/19      PT LONG TERM GOAL #2   Title  Pt. will increase Berg balance test to >50 to improve safety/ decrease fall risk with daily walking.    Baseline  Initial Berg: 45/56.  Pt. will occasionally use SPC with gait.   No recent falls.    Time  4    Period  Weeks    Status  New    Target Date  12/16/19      PT LONG TERM GOAL #3   Title  Pt. able to ambulates  with mod. independence and least assistive device on level surfaces with more normalized gait pattern to improve mobility.    Baseline  Inconsistent gait pattern with/ without use of SPC.    Time  4    Period  Weeks    Status  New    Target Date  12/16/19      PT LONG TERM GOAL #4   Title  Pt. able to complete 30 minute exercise program with no loss of balance and good endurance to improve functional mobility.    Baseline  pt. currently not participating with exercise program.    Time  4    Period  Weeks    Status  New    Target Date  12/16/19  Plan - 11/25/19 1008    Clinical Impression Statement  Pt. ambulates into PT clinic with no assistive device.  Pt. had 2 episodes of staggering gait while walking in PT clinic but able to self-correct independently . Pt. reports benefit from gastroc stretching/ use of Hypervolt on feet/lower leg.  Pt. completes Nustep ex. with no issues and requires light UE assist during tandem stace/ walking.    Stability/Clinical Decision Making  Evolving/Moderate complexity    Clinical Decision Making  Moderate    Rehab Potential  Fair    PT Frequency  1x / week    PT Duration  4 weeks    PT Treatment/Interventions  ADLs/Self Care Home Management;Electrical Stimulation;Moist Heat;Cryotherapy;Functional mobility training;Stair training;Gait training;DME Instruction;Therapeutic exercise;Therapeutic activities;Balance training;Neuromuscular re-education;Manual techniques;Patient/family education    PT Next Visit Plan  Dynamic balance tasks.  ISSUE MORE PROGRESSIVE HEP       Patient will benefit from skilled therapeutic intervention in order to improve the following deficits and impairments:  Abnormal gait, Postural dysfunction, Decreased coordination, Decreased activity tolerance, Decreased endurance, Decreased range of motion, Decreased strength, Difficulty walking, Decreased balance  Visit Diagnosis: Gait  difficulty  Imbalance     Problem List Patient Active Problem List   Diagnosis Date Noted  . Personal history of colon cancer   . Polyp of colon   . Infection due to Clostridium septicum ([place]) 08/27/2018  . 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 Acute hypoxemic respiratory failure ([place]) 06/16/2018  . Colon cancer ([place]) 05/16/2018  . Adenomatous polyp of colon 05/03/2018  . Malignant neoplasm of ascending colon ([place]) 05/03/2018  . Benign neoplasm of ascending colon   . Benign neoplasm of transverse colon   . Polyp of sigmoid colon   . Colon neoplasm   . Mass of cecum   . Diarrhea   . Stomach irritation   . Abdominal pain 04/12/2018  . CAP (community acquired pneumonia) 08/17/2016  . ESRD on peritoneal dialysis ([place]) 07/13/2014  . Essential hypertension 07/13/2014  . Type 2 diabetes mellitus with stage 5 chronic kidney disease ([place]) 07/13/2014  . Diabetic peripheral neuropathy associated with type 2 diabetes mellitus ([place]) 07/13/2014  . Vitreous hemorrhage, right eye ([place]) 03/02/2014  . Diabetic retinopathy ([place]) 03/02/2014   Pura Spice, PT, DPT # [phone] 11/25/2019, 10:14 AM  [place] [facility] [address] [place], [place], [address] Phone: [phone]   Fax:  [phone]  Name: KOHEI ANTONELLIS MRN: [id number] Date of Birth: August 10, 1940

## 2019-12-02 ENCOUNTER — Ambulatory Visit: Payer: Medicare HMO | Admitting: Physical Therapy

## 2019-12-02 ENCOUNTER — Encounter: Payer: Self-pay | Admitting: Physical Therapy

## 2019-12-02 ENCOUNTER — Other Ambulatory Visit: Payer: Self-pay

## 2019-12-02 DIAGNOSIS — R269 Unspecified abnormalities of gait and mobility: Secondary | ICD-10-CM | POA: Diagnosis not present

## 2019-12-02 DIAGNOSIS — R2689 Other abnormalities of gait and mobility: Secondary | ICD-10-CM

## 2019-12-02 NOTE — Therapy (Signed)
[facility] [address]. [place], [place], [address] Phone: [phone]   Fax:  [phone]  Physical Therapy Treatment  Patient Details  Name: Jonathan Mcgrath MRN: [id number] Date of Birth: 17-Jan-1941 Referring Provider (PT): Dr. Anthonette Legato   Encounter Date: 12/02/2019  PT End of Session - 12/02/19 0748    Visit Number  3    Number of Visits  5    Date for PT Re-Evaluation  12/16/19    Authorization - Visit Number  3    Authorization - Number of Visits  10    PT Start Time  0804    PT Stop Time  0947    PT Time Calculation (min)  45 min    Activity Tolerance  Patient tolerated treatment well    Behavior During Therapy  [facility] for tasks assessed/performed       Past Medical History:  Diagnosis Date  . Cancer ([place]) 2019   colon  . Chronic kidney disease   . Diabetes mellitus without complication (HCC)    diet controlled  . Hemodialysis access site with arteriovenous graft ([place])   . Hypertension   . Infection due to Clostridium septicum ([place]) 08/27/2018  . Renal insufficiency    Pt on dialysis x 3 years and receives every Monday, Wednesday and Friday.     Past Surgical History:  Procedure Laterality Date  . AV FISTULA PLACEMENT Left 2015   arm  . CATARACT EXTRACTION W/ INTRAOCULAR LENS  IMPLANT, BILATERAL Bilateral 08/2017  . CENTRAL LINE INSERTION-TUNNELED N/A 04/19/2018   Procedure: CENTRAL LINE INSERTION-TUNNELED;  Surgeon: Delana Meyer Dolores Lory, MD;  Location: [place] [facility];  Service: Cardiovascular;  Laterality: N/A;  . CENTRAL VENOUS CATHETER INSERTION  07/15/2018   Procedure: Insertion Hickman Catheter;  Surgeon: Algernon Huxley, MD;  Location: [place] [facility];  Service: Cardiovascular;;  . COLON RESECTION N/A 05/16/2018   Procedure: LAPAROSCOPIC  COLON RESECTION;  Surgeon: Herbert Pun, MD;  Location: [facility] ORS;  Service: General;  Laterality: N/A;  . COLONOSCOPY N/A 04/18/2018   Procedure:  COLONOSCOPY;  Surgeon: Virgel Manifold, MD;  Location: [facility] ENDOSCOPY;  Service: Endoscopy;  Laterality: N/A;  . COLONOSCOPY WITH PROPOFOL N/A 05/22/2019   Procedure: COLONOSCOPY WITH PROPOFOL;  Surgeon: Virgel Manifold, MD;  Location: [facility] ENDOSCOPY;  Service: Endoscopy;  Laterality: N/A;  . DIALYSIS/PERMA CATHETER REMOVAL N/A 06/19/2018   Procedure: DIALYSIS/PERMA CATHETER REMOVAL;  Surgeon: Algernon Huxley, MD;  Location: [place] [facility];  Service: Cardiovascular;  Laterality: N/A;  . DIALYSIS/PERMA CATHETER REMOVAL N/A 09/24/2018   Procedure: DIALYSIS/PERMA CATHETER REMOVAL;  Surgeon: Katha Cabal, MD;  Location: [place] [facility];  Service: Cardiovascular;  Laterality: N/A;  . ESOPHAGOGASTRODUODENOSCOPY N/A 04/18/2018   Procedure: ESOPHAGOGASTRODUODENOSCOPY (EGD);  Surgeon: Virgel Manifold, MD;  Location: [facility] ENDOSCOPY;  Service: Endoscopy;  Laterality: N/A;  . EYE SURGERY    . PELVIC ANGIOGRAPHY  07/15/2018   Procedure: PELVIC ANGIOGRAPHY;  Surgeon: Katha Cabal, MD;  Location: [place] [facility];  Service: Cardiovascular;;  . PERIPHERAL VASCULAR BALLOON ANGIOPLASTY Bilateral 07/15/2018   Procedure: PERIPHERAL VASCULAR BALLOON ANGIOPLASTY;  Surgeon: Algernon Huxley, MD;  Location: [place] [facility];  Service: Cardiovascular;  Laterality: Bilateral;  . PERIPHERAL VASCULAR CATHETERIZATION Left 01/12/2016   Procedure: A/V Shuntogram/Fistulagram;  Surgeon: Algernon Huxley, MD;  Location: [place] [facility];  Service: Cardiovascular;  Laterality: Left;  . PERIPHERAL VASCULAR CATHETERIZATION N/A 01/12/2016   Procedure: A/V  Shunt Intervention;  Surgeon: Algernon Huxley, MD;  Location: [place] [facility];  Service: Cardiovascular;  Laterality: N/A;  . PERITONEAL CATHETER INSERTION N/A   . REMOVAL OF A DIALYSIS CATHETER N/A 01/14/2015   Procedure: REMOVAL OF A  PERITONEAL DIALYSIS CATHETER;  Surgeon: Algernon Huxley, MD;  Location: [facility];  Service: Vascular;  Laterality: N/A;   . TEE WITHOUT CARDIOVERSION N/A 04/19/2018   Procedure: TRANSESOPHAGEAL ECHOCARDIOGRAM (TEE);  Surgeon: Teodoro Spray, MD;  Location: [facility];  Service: Cardiovascular;  Laterality: N/A;    There were no vitals filed for this visit.  Subjective Assessment - 12/02/19 0747    Subjective  Pt. reports no pain in lower leg/feet at this time.  Pt. wearing thicker socks and Crocs today.  Pt. brought SPC into PT clinic this morning.  Pt. reports compliance with HEP.    Pertinent History  Pt. enjoys fishing and lives alone.  Pt. has been receiving dialysis for the past 5 years.  Pt. still drives and has been mod. independent with use of SPC for daily activities.    Limitations  Standing;Walking;House hold activities    Patient Stated Goals  Improve balance/ independence with gait.    Currently in Pain?  No/denies         Nustep L5 10 min. (warm-up)- B UE/LE while discussing weekend activities.   Neuro.:  Walking in //-bars: Airex step ups/ 3" plinth step overs/ posture correction (light UE assist required). Tandem stance/ gait in //-bars (requires UE assist for safety/ stability) Walking in PT clinic: high marching/ alt. UE and LE touches (with and without use of SPC).  Agility ladder cone taps (2x)- requires use of SPC for safety and CGA Outside working on maintaining consistent BOS/ step pattern/ heel strike.  CGA for safety outside on varying terrain.  1 LOB with descending ramp but able to self-correct with mod. I.  Pt. Instructed to increase BOS.      Manual tx.:  Supine L/R gastroc/ ankle stretches (all planes)- 3x each with static holds as tolerated. Supine STM to B gastroc/ feet manually and addition of Hypervolt (8 min.)       PT Long Term Goals - 11/23/19 0715      PT LONG TERM GOAL #1   Title  Pt. will increase FOTO to 71 to improve functional mobility.    Baseline  Initial FOTO: 58.    Time  4    Period  Weeks    Status  New    Target Date  12/16/19      PT  LONG TERM GOAL #2   Title  Pt. will increase Berg balance test to >50 to improve safety/ decrease fall risk with daily walking.    Baseline  Initial Berg: 45/56.  Pt. will occasionally use SPC with gait.   No recent falls.    Time  4    Period  Weeks    Status  New    Target Date  12/16/19      PT LONG TERM GOAL #3   Title  Pt. able to ambulates with mod. independence and least assistive device on level surfaces with more normalized gait pattern to improve mobility.    Baseline  Inconsistent gait pattern with/ without use of SPC.    Time  4    Period  Weeks    Status  New    Target Date  12/16/19      PT LONG TERM GOAL #4  Title  Pt. able to complete 30 minute exercise program with no loss of balance and good endurance to improve functional mobility.    Baseline  pt. currently not participating with exercise program.    Time  4    Period  Weeks    Status  New    Target Date  12/16/19            Plan - 12/02/19 0748    Clinical Impression Statement  Pt. benefits from use of SPC while completing cone taps/ dynamic balance and hallway tasks for safety.  Pt. has occasional staggering/ sidestepping with walking and able to self-correct.  Difficulty with Airex ex without use of //-bars or SPC.  Pt. tends to lean posterior but able to correct with cuing/ mirror feedback.    Stability/Clinical Decision Making  Evolving/Moderate complexity    Clinical Decision Making  Moderate    Rehab Potential  Fair    PT Frequency  1x / week    PT Duration  4 weeks    PT Treatment/Interventions  ADLs/Self Care Home Management;Electrical Stimulation;Moist Heat;Cryotherapy;Functional mobility training;Stair training;Gait training;DME Instruction;Therapeutic exercise;Therapeutic activities;Balance training;Neuromuscular re-education;Manual techniques;Patient/family education    PT Next Visit Plan  Dynamic balance tasks       Patient will benefit from skilled therapeutic intervention in order to  improve the following deficits and impairments:  Abnormal gait, Postural dysfunction, Decreased coordination, Decreased activity tolerance, Decreased endurance, Decreased range of motion, Decreased strength, Difficulty walking, Decreased balance  Visit Diagnosis: Gait difficulty  Imbalance     Problem List Patient Active Problem List   Diagnosis Date Noted  . Personal history of colon cancer   . Polyp of colon   . Infection due to Clostridium septicum ([place]) 08/27/2018  . 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 Acute hypoxemic respiratory failure ([place]) 06/16/2018  . Colon cancer ([place]) 05/16/2018  . Adenomatous polyp of colon 05/03/2018  . Malignant neoplasm of ascending colon ([place]) 05/03/2018  . Benign neoplasm of ascending colon   . Benign neoplasm of transverse colon   . Polyp of sigmoid colon   . Colon neoplasm   . Mass of cecum   . Diarrhea   . Stomach irritation   . Abdominal pain 04/12/2018  . CAP (community acquired pneumonia) 08/17/2016  . ESRD on peritoneal dialysis ([place]) 07/13/2014  . Essential hypertension 07/13/2014  . Type 2 diabetes mellitus with stage 5 chronic kidney disease ([place]) 07/13/2014  . Diabetic peripheral neuropathy associated with type 2 diabetes mellitus ([place]) 07/13/2014  . Vitreous hemorrhage, right eye ([place]) 03/02/2014  . Diabetic retinopathy ([place]) 03/02/2014   Pura Spice, PT, DPT # [phone] 12/02/2019, 8:50 AM  [facility] [address] [place], [place], [address] Phone: [phone]   Fax:  [phone]  Name: Jonathan Mcgrath MRN: [id number] Date of Birth: 11/29/1940

## 2019-12-09 ENCOUNTER — Other Ambulatory Visit: Payer: Self-pay

## 2019-12-09 ENCOUNTER — Ambulatory Visit: Payer: Medicare HMO | Admitting: Physical Therapy

## 2019-12-09 ENCOUNTER — Encounter: Payer: Self-pay | Admitting: Physical Therapy

## 2019-12-09 DIAGNOSIS — R269 Unspecified abnormalities of gait and mobility: Secondary | ICD-10-CM

## 2019-12-09 DIAGNOSIS — R2689 Other abnormalities of gait and mobility: Secondary | ICD-10-CM

## 2019-12-09 NOTE — Therapy (Signed)
[facility] [address] [place], [place], [address] Phone: [phone]   Fax:  [phone]  Physical Therapy Treatment  Patient Details  Name: Jonathan Mcgrath MRN: [id number] Date of Birth: 14-Sep-1940 Referring Provider (PT): Dr. Anthonette Legato   Encounter Date: 12/09/2019  PT End of Session - 12/09/19 0820    Visit Number  4    Number of Visits  5    Date for PT Re-Evaluation  12/16/19    Authorization - Visit Number  4    Authorization - Number of Visits  10    PT Start Time  0813    PT Stop Time  0903    PT Time Calculation (min)  50 min    Activity Tolerance  Patient tolerated treatment well    Behavior During Therapy  [facility] for tasks assessed/performed       Past Medical History:  Diagnosis Date  . Cancer ([place]) 2019   colon  . Chronic kidney disease   . Diabetes mellitus without complication (HCC)    diet controlled  . Hemodialysis access site with arteriovenous graft ([place])   . Hypertension   . Infection due to Clostridium septicum ([place]) 08/27/2018  . Renal insufficiency    Pt on dialysis x 3 years and receives every Monday, Wednesday and Friday.     Past Surgical History:  Procedure Laterality Date  . AV FISTULA PLACEMENT Left 2015   arm  . CATARACT EXTRACTION W/ INTRAOCULAR LENS  IMPLANT, BILATERAL Bilateral 08/2017  . CENTRAL LINE INSERTION-TUNNELED N/A 04/19/2018   Procedure: CENTRAL LINE INSERTION-TUNNELED;  Surgeon: Delana Meyer Dolores Lory, MD;  Location: [place] [facility];  Service: Cardiovascular;  Laterality: N/A;  . CENTRAL VENOUS CATHETER INSERTION  07/15/2018   Procedure: Insertion Hickman Catheter;  Surgeon: Algernon Huxley, MD;  Location: [place] [facility];  Service: Cardiovascular;;  . COLON RESECTION N/A 05/16/2018   Procedure: LAPAROSCOPIC  COLON RESECTION;  Surgeon: Herbert Pun, MD;  Location: [facility];  Service: General;  Laterality: N/A;  . COLONOSCOPY N/A 04/18/2018   Procedure:  COLONOSCOPY;  Surgeon: Virgel Manifold, MD;  Location: [facility] ENDOSCOPY;  Service: Endoscopy;  Laterality: N/A;  . COLONOSCOPY WITH PROPOFOL N/A 05/22/2019   Procedure: COLONOSCOPY WITH PROPOFOL;  Surgeon: Virgel Manifold, MD;  Location: [facility] ENDOSCOPY;  Service: Endoscopy;  Laterality: N/A;  . DIALYSIS/PERMA CATHETER REMOVAL N/A 06/19/2018   Procedure: DIALYSIS/PERMA CATHETER REMOVAL;  Surgeon: Algernon Huxley, MD;  Location: [place] [facility];  Service: Cardiovascular;  Laterality: N/A;  . DIALYSIS/PERMA CATHETER REMOVAL N/A 09/24/2018   Procedure: DIALYSIS/PERMA CATHETER REMOVAL;  Surgeon: Katha Cabal, MD;  Location: [place] [facility];  Service: Cardiovascular;  Laterality: N/A;  . ESOPHAGOGASTRODUODENOSCOPY N/A 04/18/2018   Procedure: ESOPHAGOGASTRODUODENOSCOPY (EGD);  Surgeon: Virgel Manifold, MD;  Location: [facility] ENDOSCOPY;  Service: Endoscopy;  Laterality: N/A;  . EYE SURGERY    . PELVIC ANGIOGRAPHY  07/15/2018   Procedure: PELVIC ANGIOGRAPHY;  Surgeon: Katha Cabal, MD;  Location: [place] [facility];  Service: Cardiovascular;;  . PERIPHERAL VASCULAR BALLOON ANGIOPLASTY Bilateral 07/15/2018   Procedure: PERIPHERAL VASCULAR BALLOON ANGIOPLASTY;  Surgeon: Algernon Huxley, MD;  Location: [place] [facility];  Service: Cardiovascular;  Laterality: Bilateral;  . PERIPHERAL VASCULAR CATHETERIZATION Left 01/12/2016   Procedure: A/V Shuntogram/Fistulagram;  Surgeon: Algernon Huxley, MD;  Location: [place] [facility];  Service: Cardiovascular;  Laterality: Left;  . PERIPHERAL VASCULAR CATHETERIZATION N/A 01/12/2016   Procedure: A/V  Shunt Intervention;  Surgeon: Algernon Huxley, MD;  Location: [place] [facility];  Service: Cardiovascular;  Laterality: N/A;  . PERITONEAL CATHETER INSERTION N/A   . REMOVAL OF A DIALYSIS CATHETER N/A 01/14/2015   Procedure: REMOVAL OF A  PERITONEAL DIALYSIS CATHETER;  Surgeon: Algernon Huxley, MD;  Location: [facility];  Service: Vascular;  Laterality: N/A;   . TEE WITHOUT CARDIOVERSION N/A 04/19/2018   Procedure: TRANSESOPHAGEAL ECHOCARDIOGRAM (TEE);  Surgeon: Teodoro Spray, MD;  Location: [facility];  Service: Cardiovascular;  Laterality: N/A;    There were no vitals filed for this visit.  Subjective Assessment - 12/09/19 0816    Subjective  Pt. reports no pain or imbalance over past few days.  O2 sat.: 96%,  HR: 72 bpm,  BP: 132/46.  Pt. states he went fishing on Saturday and caught a few fish.  Pt. states he was a little nauseous last night.    Pertinent History  Pt. enjoys fishing and lives alone.  Pt. has been receiving dialysis for the past 5 years.  Pt. still drives and has been mod. independent with use of SPC for daily activities.    Limitations  Standing;Walking;House hold activities    Patient Stated Goals  Improve balance/ independence with gait.    Currently in Pain?  No/denies           Nustep L5 10 min. (warm-up)- B UE/LE while discussing weekend activities/ fishing.    Neuro.:  Walking at agility ladder with 3" plinth step overs/ Airex/ green hurdles)- CGA/min. A for safety and verbal cuing.   Walking in //-bars: cone taps L/R with moderate cuing for posture correction (UE assist required). Tandem stance/ gait in //-bars (requires UE assist for safety/ stability) Walking in PT clinic: high marching/ alt. UE and LE touches (with and without use of SPC). Outside working on maintaining consistent BOS/ step pattern/ heel strike.  CGA for safety outside on varying terrain.  Several episodes of staggering/ scissoring gait today as compared to last tx. Session.  CGA/min. A for safety   Manual tx.:  Supine L/R gastroc/ ankle stretches (all planes)- 3x each with static holds as tolerated. Supine STM to B gastroc/ feet manually and addition of Hypervolt (5 min.)       PT Long Term Goals - 11/23/19 0715      PT LONG TERM GOAL #1   Title  Pt. will increase FOTO to 71 to improve functional mobility.    Baseline   Initial FOTO: 58.    Time  4    Period  Weeks    Status  New    Target Date  12/16/19      PT LONG TERM GOAL #2   Title  Pt. will increase Berg balance test to >50 to improve safety/ decrease fall risk with daily walking.    Baseline  Initial Berg: 45/56.  Pt. will occasionally use SPC with gait.   No recent falls.    Time  4    Period  Weeks    Status  New    Target Date  12/16/19      PT LONG TERM GOAL #3   Title  Pt. able to ambulates with mod. independence and least assistive device on level surfaces with more normalized gait pattern to improve mobility.    Baseline  Inconsistent gait pattern with/ without use of SPC.    Time  4    Period  Weeks    Status  New  Target Date  12/16/19      PT LONG TERM GOAL #4   Title  Pt. able to complete 30 minute exercise program with no loss of balance and good endurance to improve functional mobility.    Baseline  pt. currently not participating with exercise program.    Time  4    Period  Weeks    Status  New    Target Date  12/16/19            Plan - 12/09/19 0820    Clinical Impression Statement  BP: 124/52 after Nustep/ hallway walking.  Marked imbalance while walking around PT clinic without use of SPC today.  Min. assist with Airex/ green hurdles at agility ladder.  Pt. wearing a black dress shoe during tx. session and reports feeling "too soft" in base of foot.  Several episodes of staggering/ scissoring gait while walking in hallway requires wall touches/ CGA to min. A for safety.  Pt. wants to continue with current HEP/ riding stationary bike and will contact PT if a week.    Stability/Clinical Decision Making  Evolving/Moderate complexity    Clinical Decision Making  Moderate    Rehab Potential  Fair    PT Frequency  1x / week    PT Duration  4 weeks    PT Treatment/Interventions  ADLs/Self Care Home Management;Electrical Stimulation;Moist Heat;Cryotherapy;Functional mobility training;Stair training;Gait training;DME  Instruction;Therapeutic exercise;Therapeutic activities;Balance training;Neuromuscular re-education;Manual techniques;Patient/family education    PT Next Visit Plan  Pt. will contact PT if a week.  PT recommends requesting additional PT visit to focus on balance tasks.       Patient will benefit from skilled therapeutic intervention in order to improve the following deficits and impairments:  Abnormal gait, Postural dysfunction, Decreased coordination, Decreased activity tolerance, Decreased endurance, Decreased range of motion, Decreased strength, Difficulty walking, Decreased balance  Visit Diagnosis: Gait difficulty  Imbalance     Problem List Patient Active Problem List   Diagnosis Date Noted  . Personal history of colon cancer   . Polyp of colon   . Infection due to Clostridium septicum ([place]) 08/27/2018  . 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 Acute hypoxemic respiratory failure ([place]) 06/16/2018  . Colon cancer ([place]) 05/16/2018  . Adenomatous polyp of colon 05/03/2018  . Malignant neoplasm of ascending colon ([place]) 05/03/2018  . Benign neoplasm of ascending colon   . Benign neoplasm of transverse colon   . Polyp of sigmoid colon   . Colon neoplasm   . Mass of cecum   . Diarrhea   . Stomach irritation   . Abdominal pain 04/12/2018  . CAP (community acquired pneumonia) 08/17/2016  . ESRD on peritoneal dialysis ([place]) 07/13/2014  . Essential hypertension 07/13/2014  . Type 2 diabetes mellitus with stage 5 chronic kidney disease ([place]) 07/13/2014  . Diabetic peripheral neuropathy associated with type 2 diabetes mellitus ([place]) 07/13/2014  . Vitreous hemorrhage, right eye ([place]) 03/02/2014  . Diabetic retinopathy ([place]) 03/02/2014   Pura Spice, PT, DPT # [phone] 12/09/2019, 9:40 AM  [facility] [address] [place], [place], [address] Phone: [phone]   Fax:  [phone]  Name: Jonathan Mcgrath MRN: [id number] Date of Birth:  03/29/1941

## 2020-01-27 ENCOUNTER — Ambulatory Visit: Payer: Medicare HMO | Attending: Infectious Diseases | Admitting: Infectious Diseases

## 2020-01-27 ENCOUNTER — Other Ambulatory Visit: Payer: Self-pay

## 2020-01-27 ENCOUNTER — Encounter: Payer: Self-pay | Admitting: Infectious Diseases

## 2020-01-27 VITALS — BP 144/67 | HR 82 | Temp 97.7°F | Resp 16 | Ht 72.0 in | Wt 191.0 lb

## 2020-01-27 DIAGNOSIS — I776 Arteritis, unspecified: Secondary | ICD-10-CM

## 2020-01-27 DIAGNOSIS — Z992 Dependence on renal dialysis: Secondary | ICD-10-CM | POA: Diagnosis not present

## 2020-01-27 DIAGNOSIS — C9110 Chronic lymphocytic leukemia of B-cell type not having achieved remission: Secondary | ICD-10-CM | POA: Diagnosis not present

## 2020-01-27 DIAGNOSIS — R7881 Bacteremia: Secondary | ICD-10-CM | POA: Insufficient documentation

## 2020-01-27 DIAGNOSIS — A480 Gas gangrene: Secondary | ICD-10-CM

## 2020-01-27 DIAGNOSIS — D72820 Lymphocytosis (symptomatic): Secondary | ICD-10-CM

## 2020-01-27 DIAGNOSIS — N186 End stage renal disease: Secondary | ICD-10-CM | POA: Insufficient documentation

## 2020-01-27 DIAGNOSIS — E1122 Type 2 diabetes mellitus with diabetic chronic kidney disease: Secondary | ICD-10-CM | POA: Diagnosis not present

## 2020-01-27 DIAGNOSIS — Z9049 Acquired absence of other specified parts of digestive tract: Secondary | ICD-10-CM | POA: Insufficient documentation

## 2020-01-27 DIAGNOSIS — E8809 Other disorders of plasma-protein metabolism, not elsewhere classified: Secondary | ICD-10-CM

## 2020-01-27 DIAGNOSIS — Z95828 Presence of other vascular implants and grafts: Secondary | ICD-10-CM | POA: Diagnosis not present

## 2020-01-27 DIAGNOSIS — Z833 Family history of diabetes mellitus: Secondary | ICD-10-CM | POA: Insufficient documentation

## 2020-01-27 DIAGNOSIS — M25571 Pain in right ankle and joints of right foot: Secondary | ICD-10-CM

## 2020-01-27 DIAGNOSIS — Z8249 Family history of ischemic heart disease and other diseases of the circulatory system: Secondary | ICD-10-CM | POA: Diagnosis not present

## 2020-01-27 DIAGNOSIS — I120 Hypertensive chronic kidney disease with stage 5 chronic kidney disease or end stage renal disease: Secondary | ICD-10-CM | POA: Diagnosis not present

## 2020-01-27 DIAGNOSIS — D696 Thrombocytopenia, unspecified: Secondary | ICD-10-CM | POA: Insufficient documentation

## 2020-01-27 DIAGNOSIS — B967 Clostridium perfringens [C. perfringens] as the cause of diseases classified elsewhere: Secondary | ICD-10-CM

## 2020-01-27 DIAGNOSIS — D649 Anemia, unspecified: Secondary | ICD-10-CM | POA: Diagnosis not present

## 2020-01-27 DIAGNOSIS — E119 Type 2 diabetes mellitus without complications: Secondary | ICD-10-CM

## 2020-01-27 DIAGNOSIS — M25561 Pain in right knee: Secondary | ICD-10-CM

## 2020-01-27 MED ORDER — AMOXICILLIN-POT CLAVULANATE 500-125 MG PO TABS: 1.0000 | ORAL_TABLET | Freq: Every day | ORAL | 12 refills | Status: DC

## 2020-01-27 NOTE — Patient Instructions (Signed)
Are here for follow up appt.for aortitis  You are doing well from that perspective. But you fell last night- as you have a PCP appt this morning at 10.30 please discuss with them. The labs you will need to do include ESR, CRP, CMP.CBC. you can see whether your PCP will be addingany other labs and you can do it all together Follow up 1 year 

## 2020-01-27 NOTE — Progress Notes (Signed)
NAME: Jonathan Mcgrath  DOB: May 12, 1941  MRN: [id number]  Date/Time: 01/27/2020 9:24 AM  Subjective:   Follow up visit for aortitis  Due to Clostridium septicum ? Jonathan Mcgrath is a 79 y.o. 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  He fell last night while going to the kitchen and landed on his rt knee and not sure whether he twisted his ankle.  The rt ankle and knee is hurting His daughter brought him to the appt He has an appt with his PCP at 10.40 this monring  Pt is doing well otherwise No pain abdomen No fever or chills No diarrhea appetiie good Lives on his own Goes for dialysis  Past Medical History:  Diagnosis Date  . Cancer ([place]) 2019   colon  . Chronic kidney disease   . Diabetes mellitus without complication (HCC)    diet controlled  . Hemodialysis access site with arteriovenous graft ([place])   . Hypertension   . Infection due to Clostridium septicum ([place]) 08/27/2018  . Renal insufficiency    Pt on dialysis x 3 years and receives every Monday, Wednesday and Friday.   CLL   Past Surgical History:  Procedure Laterality Date  . AV FISTULA PLACEMENT Left 2015   arm  . CATARACT EXTRACTION W/ INTRAOCULAR LENS  IMPLANT, BILATERAL Bilateral 08/2017  . CENTRAL LINE INSERTION-TUNNELED N/A 04/19/2018   Procedure: CENTRAL LINE INSERTION-TUNNELED;  Surgeon: Delana Meyer Dolores Lory, MD;  Location: [place] [facility];  Service: Cardiovascular;  Laterality: N/A;  . CENTRAL VENOUS CATHETER INSERTION  07/15/2018   Procedure: Insertion Hickman Catheter;  Surgeon: Algernon Huxley, MD;  Location: [place] [facility];  Service: Cardiovascular;;  . COLON RESECTION N/A 05/16/2018   Procedure: LAPAROSCOPIC  COLON  RESECTION;  Surgeon: Herbert Pun, MD;  Location: [facility];  Service: General;  Laterality: N/A;  . COLONOSCOPY N/A 04/18/2018   Procedure: COLONOSCOPY;  Surgeon: Virgel Manifold, MD;  Location: [facility];  Service: Endoscopy;  Laterality: N/A;  . COLONOSCOPY WITH PROPOFOL N/A 05/22/2019   Procedure: COLONOSCOPY WITH PROPOFOL;  Surgeon: Virgel Manifold, MD;  Location: [facility];  Service: Endoscopy;  Laterality: N/A;  . DIALYSIS/PERMA CATHETER REMOVAL N/A 06/19/2018   Procedure: DIALYSIS/PERMA CATHETER REMOVAL;  Surgeon: Algernon Huxley, MD;  Location: [place] [facility];  Service: Cardiovascular;  Laterality: N/A;  . DIALYSIS/PERMA CATHETER REMOVAL N/A 09/24/2018   Procedure: DIALYSIS/PERMA CATHETER REMOVAL;  Surgeon: Katha Cabal, MD;  Location: [place] [facility];  Service: Cardiovascular;  Laterality: N/A;  . ESOPHAGOGASTRODUODENOSCOPY N/A 04/18/2018   Procedure: ESOPHAGOGASTRODUODENOSCOPY (EGD);  Surgeon: Virgel Manifold, MD;  Location: [facility];  Service: Endoscopy;  Laterality: N/A;  . EYE SURGERY    . PELVIC ANGIOGRAPHY  07/15/2018   Procedure: PELVIC ANGIOGRAPHY;  Surgeon: Katha Cabal, MD;  Location: [place] [facility];  Service: Cardiovascular;;  . PERIPHERAL VASCULAR BALLOON ANGIOPLASTY Bilateral 07/15/2018   Procedure: PERIPHERAL VASCULAR BALLOON ANGIOPLASTY;  Surgeon: Algernon Huxley, MD;  Location: [place] [facility];  Service: Cardiovascular;  Laterality: Bilateral;  . PERIPHERAL VASCULAR CATHETERIZATION Left 01/12/2016   Procedure: A/V Shuntogram/Fistulagram;  Surgeon: Algernon Huxley, MD;  Location: [place] [facility];  Service: Cardiovascular;  Laterality: Left;  . PERIPHERAL VASCULAR CATHETERIZATION N/A 01/12/2016  Procedure: A/V Shunt Intervention;  Surgeon: Algernon Huxley, MD;  Location: [place] [facility];  Service: Cardiovascular;  Laterality: N/A;  . PERITONEAL CATHETER INSERTION N/A   . REMOVAL OF A DIALYSIS CATHETER N/A  01/14/2015   Procedure: REMOVAL OF A  PERITONEAL DIALYSIS CATHETER;  Surgeon: Algernon Huxley, MD;  Location: [facility];  Service: Vascular;  Laterality: N/A;  . TEE WITHOUT CARDIOVERSION N/A 04/19/2018   Procedure: TRANSESOPHAGEAL ECHOCARDIOGRAM (TEE);  Surgeon: Teodoro Spray, MD;  Location: [facility];  Service: Cardiovascular;  Laterality: N/A;    Social History   Socioeconomic History  . Marital status: Widowed    Spouse name: Not on file  . Number of children: 4  . Years of education: Not on file  . Highest education level: Not on file  Occupational History  . Occupation: retired  Tobacco Use  . Smoking status: Never Smoker  . Smokeless tobacco: Never Used  Vaping Use  . Vaping Use: Never used  Substance and Sexual Activity  . Alcohol use: No  . Drug use: No  . Sexual activity: Not on file  Other Topics Concern  . Not on file  Social History Narrative  . Not on file   Social Determinants of Health   Financial Resource Strain:   . Difficulty of Paying Living Expenses:   Food Insecurity:   . Worried About Charity fundraiser in the Last Year:   . Arboriculturist in the Last Year:   Transportation Needs:   . Film/video editor (Medical):   Marland Kitchen Lack of Transportation (Non-Medical):   Physical Activity:   . Days of Exercise per Week:   . Minutes of Exercise per Session:   Stress:   . Feeling of Stress :   Social Connections:   . Frequency of Communication with Friends and Family:   . Frequency of Social Gatherings with Friends and Family:   . Attends Religious Services:   . Active Member of Clubs or Organizations:   . Attends Archivist Meetings:   Marland Kitchen Marital Status:   Intimate Partner Violence:   . Fear of Current or Ex-Partner:   . Emotionally Abused:   Marland Kitchen Physically Abused:   . Sexually Abused:     Family History  Problem Relation Age of Onset  . Diabetes Mother   . Heart disease Father    No Known Allergies  ? Current Outpatient Medications   Medication Sig Dispense Refill  . amoxicillin-clavulanate (AUGMENTIN) 500-125 MG tablet Take 1 tablet (500 mg total) by mouth daily. 30 tablet 6  . ferric citrate (AURYXIA) 1 GM 210 MG(Fe) tablet Take 210 mg by mouth daily.     . multivitamin (RENA-VIT) TABS tablet Take 1 tablet by mouth daily.  11  . ondansetron (ZOFRAN) 4 MG tablet Take 4 mg by mouth 3 (three) times daily as needed for nausea/vomiting. (Patient not taking: Reported on 01/27/2020)     No current facility-administered medications for this visit.     Abtx:  Anti-infectives (From admission, onward)   None      REVIEW OF SYSTEMS:  Const: negative fever, negative chills, negative weight loss Eyes: negative diplopia or visual changes, negative eye pain ENT: negative coryza, negative sore throat, hard of hearing Resp: negative cough, hemoptysis, dyspnea Cards: negative for chest pain, palpitations, lower extremity edema GU: negative for frequency, dysuria and hematuria GI: some nausea ,Negative for abdominal pain, diarrhea, bleeding, constipation Skin: negative for rash and pruritus Heme: negative  for easy bruising and gum/nose bleeding MS: rt knee and rt ankle pain Neurolo:negative for headaches, dizziness, vertigo, memory problems  Psych: negative for feelings of anxiety, depression  Endocrine:as above Allergy/Immunology- negative for any medication or food allergies ?  Objective:  VITALS:  BP (!) 144/67   Pulse 82   Temp 97.7 F (36.5 C)   Resp 16   Ht 6' (1.829 m)   Wt 191 lb (86.6 kg)   SpO2 93%   BMI 25.90 kg/m  PHYSICAL EXAM:  General: Alert, cooperative, no distress, appears stated age.  Head: Normocephalic, without obvious abnormality, atraumatic. Eyes: Conjunctivae clear, anicteric sclerae. Pupils are equal ENT did not examine Neck: Supple, symmetrical, no adenopathy, thyroid: non tender no carotid bruit and no JVD. Back: No CVA tenderness. Lungs: Clear to auscultation bilaterally. No Wheezing  or Rhonchi. No rales. Heart:systolic murmur'  Abdomen: Soft, non-tender,not distended. Bowel sounds normal. No masses Extremities: rt knee not swollen Rt ankle does not appear to be swollen, mild edema rt foot-  Movement at ankle joint not restricted but painful Skin: No rashes or lesions. Or bruising Lymph: Cervical, supraclavicular normal. Neurologic: Grossly non-focal Pertinent Labs Lab Results CBC Latest Ref Rng & Units 07/29/2019 01/02/2019 07/18/2018  WBC 4.0 - 10.5 K/uL 22.7(H) 14.9(H) 17.0(H)  Hemoglobin 13.0 - 17.0 g/dL 13.4 12.0(L) 8.7(L)  Hematocrit 39 - 52 % 40.7 36.6(L) 26.9(L)  Platelets 150 - 400 K/uL 109(L) 83(L) 180   CMP Latest Ref Rng & Units 07/29/2019 01/02/2019 07/17/2018  Glucose 70 - 99 mg/dL 127(H) 89 144(H)  BUN 8 - 23 mg/dL 37(H) 46(H) 53(H)  Creatinine 0.61 - 1.24 mg/dL 8.07(H) 6.39(H) 7.90(H)  Sodium 135 - 145 mmol/L 142 143 140  Potassium 3.5 - 5.1 mmol/L 4.4 4.4 3.6  Chloride 98 - 111 mmol/L 96(L) 104 102  CO2 22 - 32 mmol/L 34(H) 30 26  Calcium 8.9 - 10.3 mg/dL 9.5 9.1 7.8(L)  Total Protein 6.5 - 8.1 g/dL 6.8 6.5 -  Total Bilirubin 0.3 - 1.2 mg/dL 0.9 0.7 -  Alkaline Phos 38 - 126 U/L 58 84 -  AST 15 - 41 U/L 22 27 -  ALT 0 - 44 U/L 20 38 -     Microbiology: No results found for this or any previous visit (from the past 240 hour(s)).  IMAGING RESULTS: None   ? 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 Daughter taking him there   s/p rt hemicolectomy and primary  anastomosis 05/16/18 for ca cecum Last  colonoscopy 05/22/2019 -a few polyps but no malignancy or recurrence at the anastomosis site  Leucocytosis since 2013- absolute lymphocytosis- has a diagnosis of CLL (cannot see those notes in Epic as it was in 2014)  follow up with heme/onc  Thrombocytopenia since 2013 likely due to the above Fluctuates- when he had the aortitis it had normalized due to the infection  Anemia -   Hypoalbuminemia resolved Normal LFTS  Continue augmentin 540m once a day indefinitely  Last ESR and CRP from 07/29/19  4 and < 0.5 respectively He has received both doses of mRNA vaccine- Jan 14 and Feb 4th ? ___________________________________________________ Discussed with patient Follow up 12 months

## 2020-08-24 IMAGING — CT CT ABD-PELV W/O CM
2 of 7 series · 15 of 46 positions shown, 17 images · non-contrast
Comparison: 01/28/2011.

CLINICAL DATA: Fever and chills, nausea and diarrhea

EXAM:
CT ABDOMEN AND PELVIS WITHOUT CONTRAST
TECHNIQUE: Multidetector CT imaging of the abdomen and pelvis was performed
following the standard protocol without IV contrast.

[Series 2: routine abd/pel wo · axial · 0.78mm/px · z∈[-952,-522]mm · 12 of 96 slices shown, 14 images]
[im 5/96  soft-tissue]
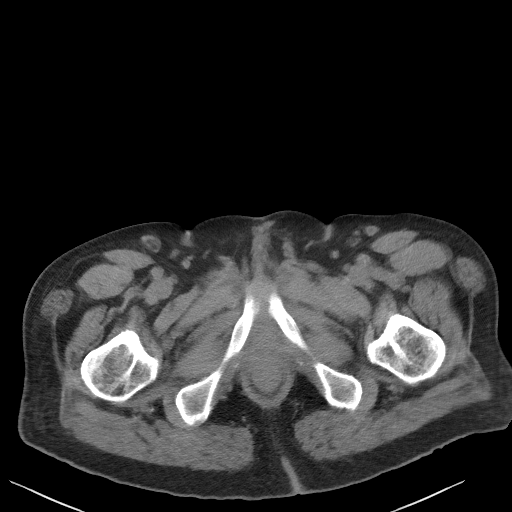
[im 5/96  bone]
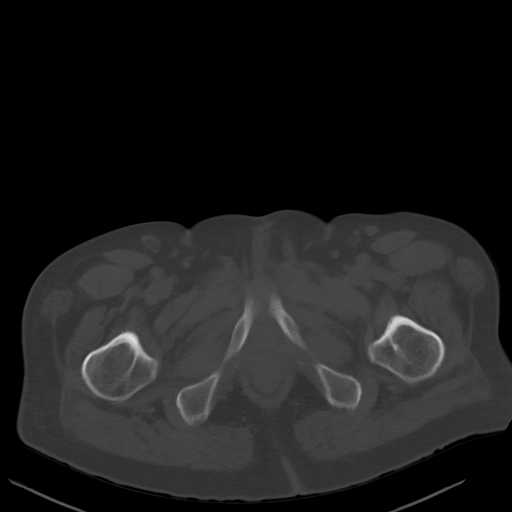
[im 15/96  soft-tissue]
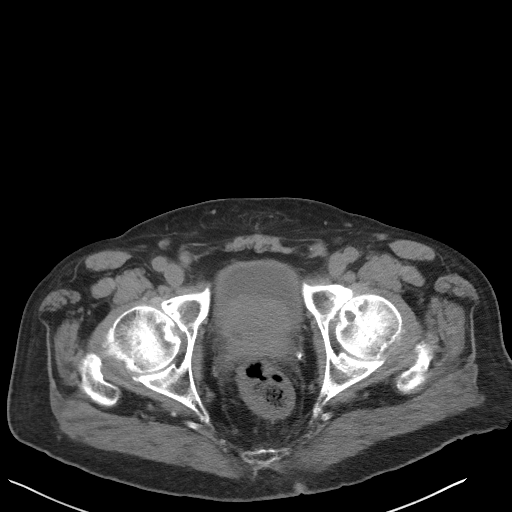
[im 20/96  soft-tissue]
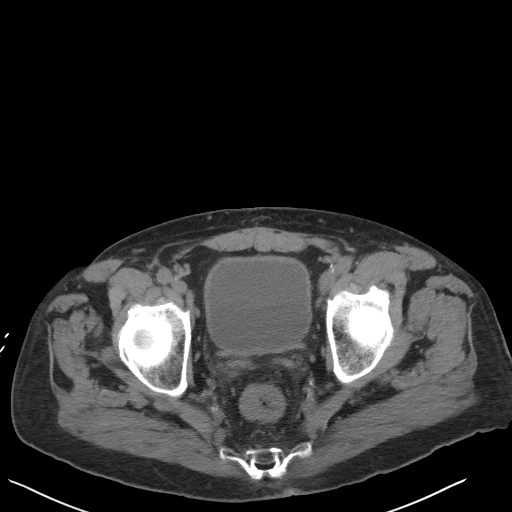
[im 29/96  soft-tissue]
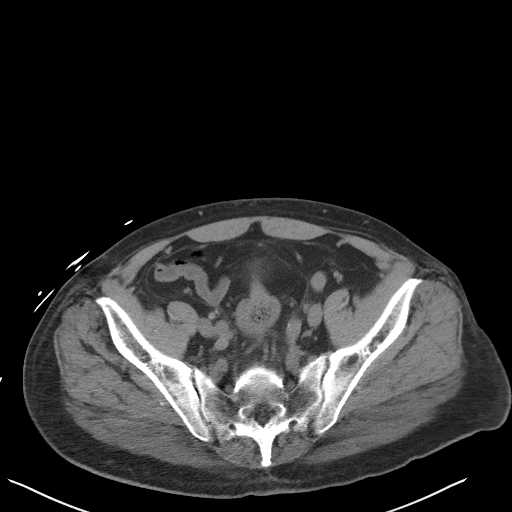
[im 39/96  soft-tissue]
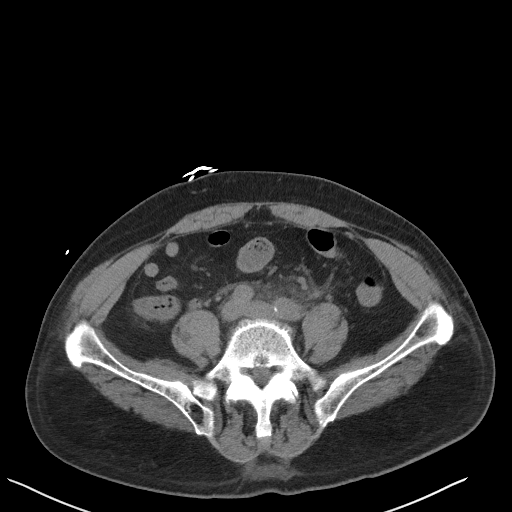
[im 43/96  soft-tissue]
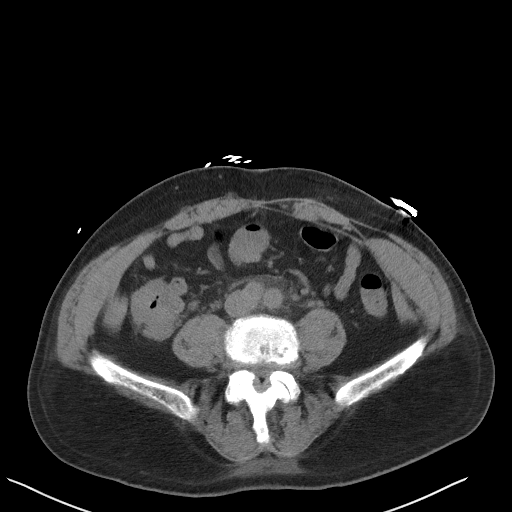
[im 53/96  soft-tissue]
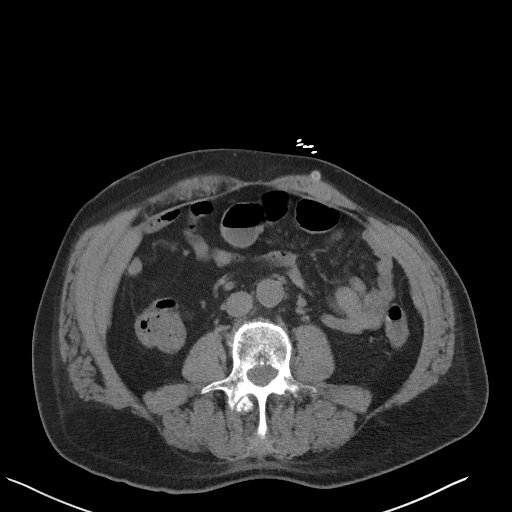
[im 58/96  soft-tissue]
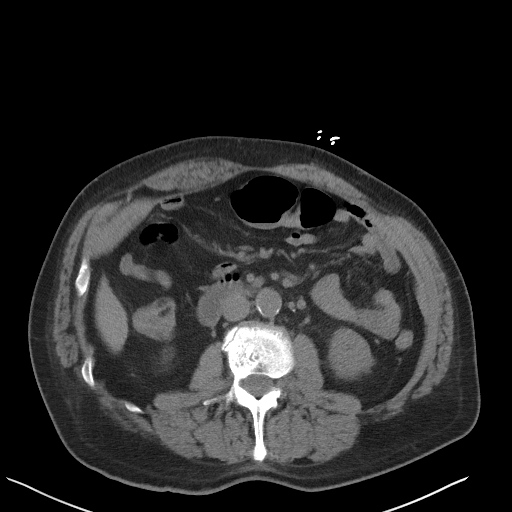
[im 67/96  soft-tissue]
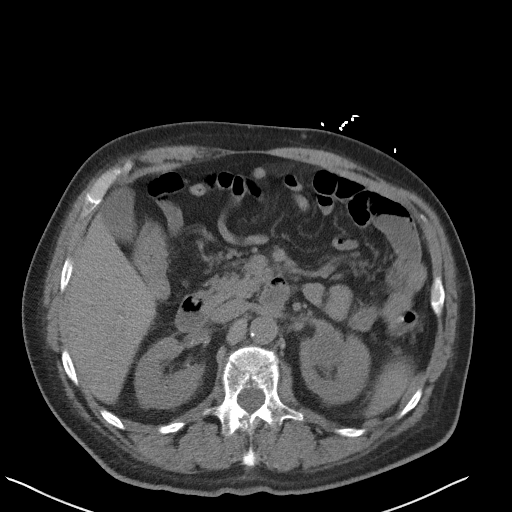
[im 67/96  bone]
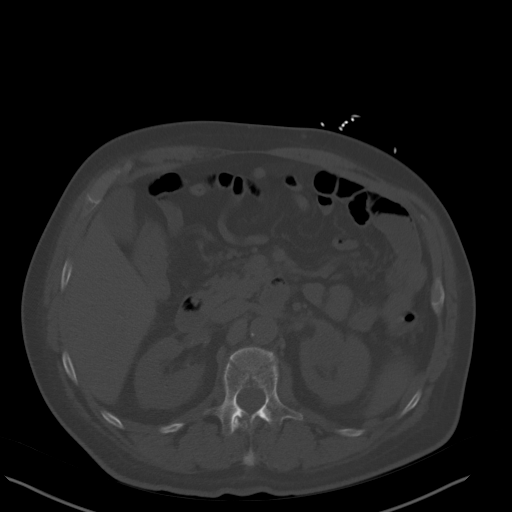
[im 77/96  soft-tissue]
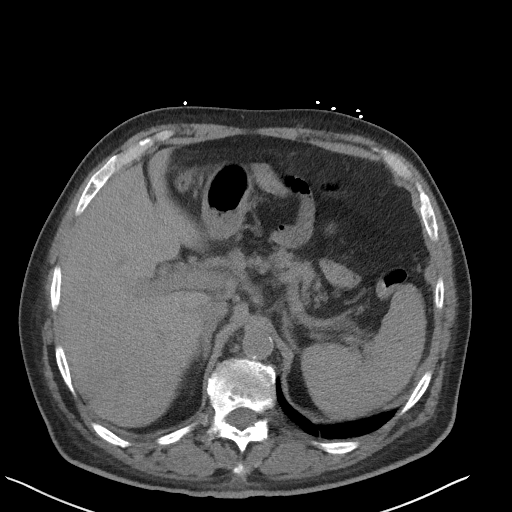
[im 81/96  soft-tissue]
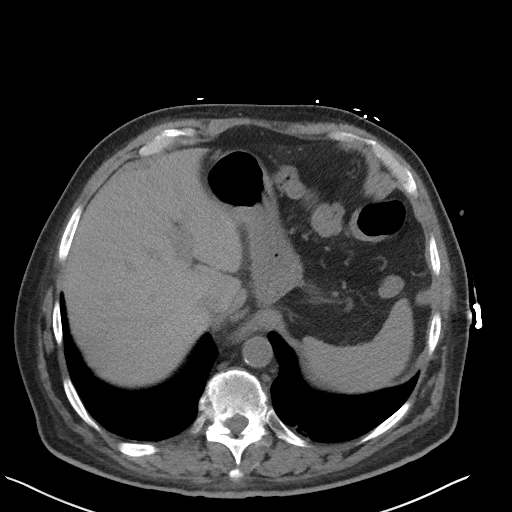
[im 91/96  soft-tissue]
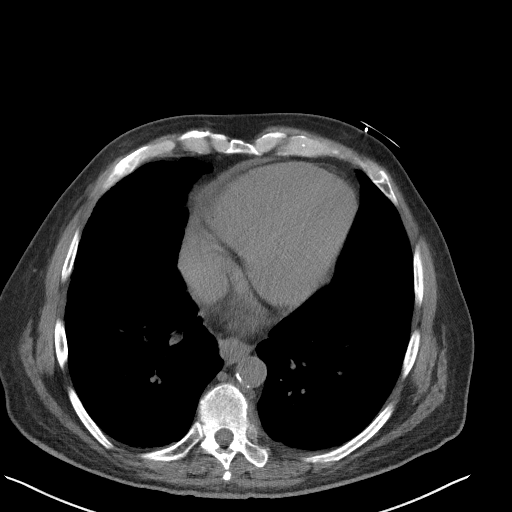

[Series 6: coronal st · coronal · 0.77mm/px · 3 of 106 slices shown]
[im 22/106  soft-tissue]
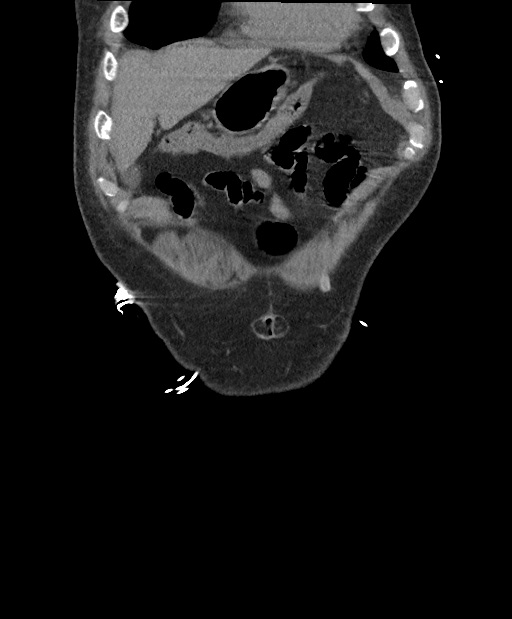
[im 43/106  soft-tissue]
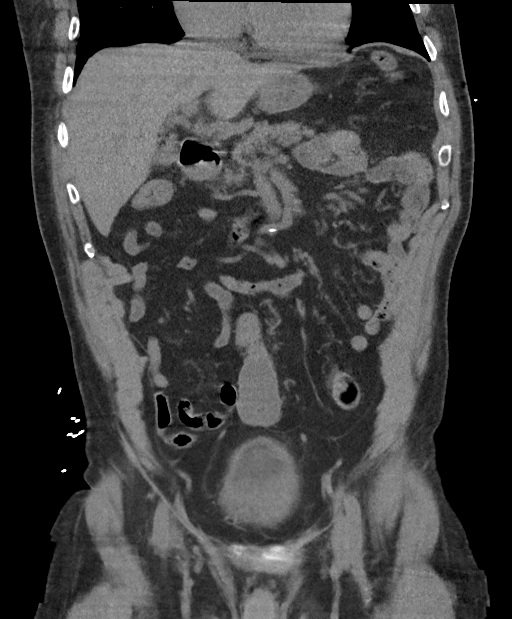
[im 64/106  soft-tissue]
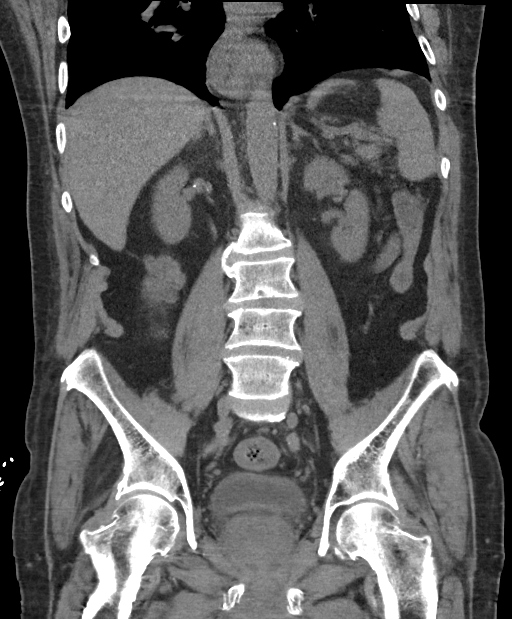

[15 of 46 positions shown; findings below may reference images not displayed]

FINDINGS: Lower chest: Lung bases are clear. Moderate-sized hiatal hernia is
noted.

Hepatobiliary: Tiny dependent gallstones are noted. The liver is
within normal limits.

Pancreas: Unremarkable. No pancreatic ductal dilatation or
surrounding inflammatory changes.

Spleen: Normal in size without focal abnormality.

Adrenals/Urinary Tract: Adrenal glands are within normal limits. The
kidneys are well visualized and demonstrate tiny renal calculi
bilaterally. Some vascular calcifications are noted as well. In the
upper pole of the left kidney there is a vague hypodensity which
measures 3.2 cm in greatest dimension. This was seen on a prior CT
examination measuring 2.6 cm. Bladder is well distended. No
obstructive changes are seen..

Stomach/Bowel: Minimal diverticular change of the colon is noted. No
obstructive or inflammatory changes of the colon are seen. The
appendix is well visualized and within normal limits. No obstructive
small bowel changes are seen.

Vascular/Lymphatic: Aortic calcifications are noted. No significant
lymphadenopathy is identified.

Reproductive: Prostate is unremarkable.

Other: No free fluid is noted within the abdomen or pelvis. Along
the anterior aspect of the distal aorta and proximal common iliac
artery there is some vague inflammatory changes surrounding the
mesenteric vessels. No significant lymphadenopathy is noted. No
focal fluid collection is seen.

Musculoskeletal: Degenerative changes of lumbar spine are noted.
IMPRESSION: Mild inflammatory changes surrounding the inferior mesenteric artery
and inferior mesenteric vein along the anterior aspect of the aorta
and left common iliac artery. No focal fluid collection is noted.
The etiology is uncertain as no inflamed loops of bowel are noted
adjacent to these changes. This may be related to some localized
venous thrombosis.

Moderate-sized hiatal hernia.

Cholelithiasis without complicating factors.

Hypodensity in the upper pole of the left kidney which is larger
than that seen in 5755 but likely represents a complicated cyst.
Nonemergent ultrasound may be helpful for further evaluation.

## 2021-01-27 ENCOUNTER — Ambulatory Visit: Payer: Medicare HMO | Admitting: Infectious Diseases

## 2021-02-15 ENCOUNTER — Inpatient Hospital Stay
Admission: EM | Admit: 2021-02-15 | Discharge: 2021-02-17 | DRG: 177 | Disposition: A | Discharge status: Home Health | Payer: Medicare Other | Attending: Internal Medicine | Admitting: Internal Medicine

## 2021-02-15 ENCOUNTER — Other Ambulatory Visit: Payer: Self-pay

## 2021-02-15 ENCOUNTER — Emergency Department: Payer: Medicare Other

## 2021-02-15 DIAGNOSIS — Z992 Dependence on renal dialysis: Secondary | ICD-10-CM

## 2021-02-15 DIAGNOSIS — I120 Hypertensive chronic kidney disease with stage 5 chronic kidney disease or end stage renal disease: Secondary | ICD-10-CM | POA: Diagnosis not present

## 2021-02-15 DIAGNOSIS — J811 Chronic pulmonary edema: Secondary | ICD-10-CM | POA: Diagnosis present

## 2021-02-15 DIAGNOSIS — I214 Non-ST elevation (NSTEMI) myocardial infarction: Secondary | ICD-10-CM

## 2021-02-15 DIAGNOSIS — Z9842 Cataract extraction status, left eye: Secondary | ICD-10-CM

## 2021-02-15 DIAGNOSIS — Z9049 Acquired absence of other specified parts of digestive tract: Secondary | ICD-10-CM | POA: Diagnosis not present

## 2021-02-15 DIAGNOSIS — Z961 Presence of intraocular lens: Secondary | ICD-10-CM | POA: Diagnosis present

## 2021-02-15 DIAGNOSIS — N2581 Secondary hyperparathyroidism of renal origin: Secondary | ICD-10-CM | POA: Diagnosis not present

## 2021-02-15 DIAGNOSIS — I776 Arteritis, unspecified: Secondary | ICD-10-CM | POA: Diagnosis present

## 2021-02-15 DIAGNOSIS — N186 End stage renal disease: Secondary | ICD-10-CM | POA: Diagnosis not present

## 2021-02-15 DIAGNOSIS — E1122 Type 2 diabetes mellitus with diabetic chronic kidney disease: Secondary | ICD-10-CM | POA: Diagnosis present

## 2021-02-15 DIAGNOSIS — J810 Acute pulmonary edema: Secondary | ICD-10-CM | POA: Diagnosis not present

## 2021-02-15 DIAGNOSIS — Z9841 Cataract extraction status, right eye: Secondary | ICD-10-CM

## 2021-02-15 DIAGNOSIS — U071 COVID-19: Principal | ICD-10-CM | POA: Diagnosis present

## 2021-02-15 DIAGNOSIS — I10 Essential (primary) hypertension: Secondary | ICD-10-CM | POA: Diagnosis present

## 2021-02-15 DIAGNOSIS — Z85038 Personal history of other malignant neoplasm of large intestine: Secondary | ICD-10-CM | POA: Diagnosis not present

## 2021-02-15 DIAGNOSIS — Z833 Family history of diabetes mellitus: Secondary | ICD-10-CM

## 2021-02-15 DIAGNOSIS — J069 Acute upper respiratory infection, unspecified: Secondary | ICD-10-CM | POA: Diagnosis present

## 2021-02-15 DIAGNOSIS — R778 Other specified abnormalities of plasma proteins: Secondary | ICD-10-CM | POA: Diagnosis present

## 2021-02-15 DIAGNOSIS — J9601 Acute respiratory failure with hypoxia: Secondary | ICD-10-CM | POA: Diagnosis present

## 2021-02-15 DIAGNOSIS — Z8249 Family history of ischemic heart disease and other diseases of the circulatory system: Secondary | ICD-10-CM

## 2021-02-15 DIAGNOSIS — D696 Thrombocytopenia, unspecified: Secondary | ICD-10-CM | POA: Diagnosis present

## 2021-02-15 DIAGNOSIS — D631 Anemia in chronic kidney disease: Secondary | ICD-10-CM | POA: Diagnosis not present

## 2021-02-15 DIAGNOSIS — J1282 Pneumonia due to coronavirus disease 2019: Secondary | ICD-10-CM | POA: Diagnosis not present

## 2021-02-15 LAB — CBC WITH DIFFERENTIAL/PLATELET
Abs Immature Granulocytes: 0.03 10*3/uL (ref 0.00–0.07)
Basophils Absolute: 0 10*3/uL (ref 0.0–0.1)
Basophils Relative: 0 %
Eosinophils Absolute: 0.1 10*3/uL (ref 0.0–0.5)
Eosinophils Relative: 1 %
HCT: 35.8 % — ABNORMAL LOW (ref 39.0–52.0)
Hemoglobin: 12.1 g/dL — ABNORMAL LOW (ref 13.0–17.0)
Immature Granulocytes: 0 %
Lymphocytes Relative: 65 %
Lymphs Abs: 7.4 10*3/uL — ABNORMAL HIGH (ref 0.7–4.0)
MCH: 30.9 pg (ref 26.0–34.0)
MCHC: 33.8 g/dL (ref 30.0–36.0)
MCV: 91.3 fL (ref 80.0–100.0)
Monocytes Absolute: 0.7 10*3/uL (ref 0.1–1.0)
Monocytes Relative: 6 %
Neutro Abs: 3.2 10*3/uL (ref 1.7–7.7)
Neutrophils Relative %: 28 %
Platelets: 75 10*3/uL — ABNORMAL LOW (ref 150–400)
RBC: 3.92 MIL/uL — ABNORMAL LOW (ref 4.22–5.81)
RDW: 15.1 % (ref 11.5–15.5)
Smear Review: NORMAL
WBC: 11.4 10*3/uL — ABNORMAL HIGH (ref 4.0–10.5)
nRBC: 0 % (ref 0.0–0.2)

## 2021-02-15 LAB — PROCALCITONIN: Procalcitonin: 0.48 ng/mL

## 2021-02-15 LAB — TROPONIN I (HIGH SENSITIVITY)
Troponin I (High Sensitivity): 257 ng/L (ref <18)
Troponin I (High Sensitivity): 246 ng/L (ref <18)
Troponin I (High Sensitivity): 242 ng/L (ref <18)
Troponin I (High Sensitivity): 259 ng/L (ref <18)

## 2021-02-15 LAB — COMPREHENSIVE METABOLIC PANEL
ALT: 20 U/L (ref 0–44)
AST: 30 U/L (ref 15–41)
Albumin: 3.5 g/dL (ref 3.5–5.0)
Alkaline Phosphatase: 61 U/L (ref 38–126)
Anion gap: 11 (ref 5–15)
BUN: 53 mg/dL — ABNORMAL HIGH (ref 8–23)
CO2: 30 mmol/L (ref 22–32)
Calcium: 8.6 mg/dL — ABNORMAL LOW (ref 8.9–10.3)
Chloride: 96 mmol/L — ABNORMAL LOW (ref 98–111)
Creatinine, Ser: 9.71 mg/dL — ABNORMAL HIGH (ref 0.61–1.24)
GFR, Estimated: 5 mL/min — ABNORMAL LOW (ref >60)
Glucose, Bld: 94 mg/dL (ref 70–99)
Potassium: 4.2 mmol/L (ref 3.5–5.1)
Sodium: 137 mmol/L (ref 135–145)
Total Bilirubin: 0.8 mg/dL (ref 0.3–1.2)
Total Protein: 5.9 g/dL — ABNORMAL LOW (ref 6.5–8.1)

## 2021-02-15 LAB — BRAIN NATRIURETIC PEPTIDE: B Natriuretic Peptide: 695.1 pg/mL — ABNORMAL HIGH (ref 0.0–100.0)

## 2021-02-15 LAB — MAGNESIUM: Magnesium: 2.1 mg/dL (ref 1.7–2.4)

## 2021-02-15 LAB — D-DIMER, QUANTITATIVE: D-Dimer, Quant: 2.08 ug/mL-FEU — ABNORMAL HIGH (ref 0.00–0.50)

## 2021-02-15 MED ORDER — DEXAMETHASONE SODIUM PHOSPHATE 10 MG/ML IJ SOLN
6.0000 mg | Freq: Once | INTRAMUSCULAR | Status: AC
  Administered 2021-02-15: 6 mg via INTRAVENOUS
  Filled 2021-02-15: qty 1

## 2021-02-15 MED ORDER — LABETALOL HCL 5 MG/ML IV SOLN
10.0000 mg | Freq: Once | INTRAVENOUS | Status: AC
  Administered 2021-02-15: 10 mg via INTRAVENOUS
  Filled 2021-02-15: qty 4

## 2021-02-15 MED ORDER — ASPIRIN 81 MG PO CHEW: 324.0000 mg | CHEWABLE_TABLET | Freq: Once | ORAL | Status: DC

## 2021-02-15 MED ORDER — ACETAMINOPHEN 160 MG/5ML PO SOLN
650.0000 mg | Freq: Four times a day (QID) | ORAL | Status: DC | PRN
  Filled 2021-02-15: qty 20.3

## 2021-02-15 MED ORDER — ONDANSETRON 4 MG PO TBDP
4.0000 mg | ORAL_TABLET | Freq: Once | ORAL | Status: AC
  Administered 2021-02-15: 4 mg via ORAL
  Filled 2021-02-15: qty 1

## 2021-02-15 MED ORDER — SODIUM CHLORIDE 0.9 % IV SOLN
100.0000 mg | Freq: Every day | INTRAVENOUS | Status: DC
  Administered 2021-02-16 – 2021-02-17 (×2): 100 mg via INTRAVENOUS
  Filled 2021-02-15 (×2): qty 100

## 2021-02-15 MED ORDER — METHYLPREDNISOLONE SODIUM SUCC 40 MG IJ SOLR
40.0000 mg | Freq: Two times a day (BID) | INTRAMUSCULAR | Status: DC
  Administered 2021-02-16 – 2021-02-17 (×3): 40 mg via INTRAVENOUS
  Filled 2021-02-15 (×3): qty 1

## 2021-02-15 MED ORDER — IPRATROPIUM BROMIDE HFA 17 MCG/ACT IN AERS
2.0000 | INHALATION_SPRAY | RESPIRATORY_TRACT | Status: DC
  Administered 2021-02-16 – 2021-02-17 (×9): 2 via RESPIRATORY_TRACT
  Filled 2021-02-15 (×2): qty 12.9

## 2021-02-15 MED ORDER — HYDRALAZINE HCL 20 MG/ML IJ SOLN: 5.0000 mg | INTRAMUSCULAR | Status: DC | PRN

## 2021-02-15 MED ORDER — AMLODIPINE BESYLATE 5 MG PO TABS
5.0000 mg | ORAL_TABLET | Freq: Every day | ORAL | Status: DC
  Administered 2021-02-15 – 2021-02-17 (×3): 5 mg via ORAL
  Filled 2021-02-15 (×3): qty 1

## 2021-02-15 MED ORDER — ASPIRIN EC 81 MG PO TBEC
81.0000 mg | DELAYED_RELEASE_TABLET | Freq: Every day | ORAL | Status: DC
  Administered 2021-02-15 – 2021-02-17 (×3): 81 mg via ORAL
  Filled 2021-02-15 (×2): qty 1

## 2021-02-15 MED ORDER — FUROSEMIDE 10 MG/ML IJ SOLN
40.0000 mg | Freq: Once | INTRAMUSCULAR | Status: AC
  Administered 2021-02-15: 40 mg via INTRAVENOUS
  Filled 2021-02-15: qty 4

## 2021-02-15 MED ORDER — DM-GUAIFENESIN ER 30-600 MG PO TB12: 1.0000 | ORAL_TABLET | Freq: Two times a day (BID) | ORAL | Status: DC | PRN

## 2021-02-15 MED ORDER — CINACALCET HCL 30 MG PO TABS
60.0000 mg | ORAL_TABLET | Freq: Every day | ORAL | Status: DC
  Administered 2021-02-16 – 2021-02-17 (×2): 60 mg via ORAL
  Filled 2021-02-15 (×2): qty 2

## 2021-02-15 MED ORDER — CINACALCET HCL 30 MG PO TABS
60.0000 mg | ORAL_TABLET | Freq: Every day | ORAL | Status: DC
  Filled 2021-02-15: qty 2

## 2021-02-15 MED ORDER — SODIUM CHLORIDE 0.9 % IV SOLN
200.0000 mg | Freq: Once | INTRAVENOUS | Status: AC
  Administered 2021-02-15: 200 mg via INTRAVENOUS
  Filled 2021-02-15: qty 200

## 2021-02-15 MED ORDER — RENA-VITE PO TABS
1.0000 | ORAL_TABLET | Freq: Every day | ORAL | Status: DC
  Administered 2021-02-16 – 2021-02-17 (×2): 1 via ORAL
  Filled 2021-02-15 (×2): qty 1

## 2021-02-15 MED ORDER — ALBUTEROL SULFATE HFA 108 (90 BASE) MCG/ACT IN AERS
2.0000 | INHALATION_SPRAY | RESPIRATORY_TRACT | Status: DC | PRN
  Filled 2021-02-15: qty 6.7

## 2021-02-15 NOTE — Progress Notes (Signed)
Remdesivir - Pharmacy Brief Note   O:  CXR: Pulmonary vascular congestion with possible interstitial pulmonary edema. SpO2: 97% on room air   A/P:  Remdesivir 200 mg IVPB once followed by 100 mg IVPB daily x 4 days.   Dorena Bodo, PharmD, BCPS 02/15/2021 1:38 PM

## 2021-02-15 NOTE — ED Provider Notes (Signed)
[facility] Emergency Department Provider Note  ____________________________________________   Event Date/Time   First MD Initiated Contact with Patient 02/15/21 1208     (approximate)  I have reviewed the triage vital signs and the nursing notes.   HISTORY  Chief Complaint Covid Positive   HPI Jonathan Mcgrath is a 80 y.o. male with a past medical history of DM, HTN, and ESRD on HD MWF who presents for assessment of some cough and fatigue And some soreness in his extremities that began yesterday when he was diagnosed with COVID.  He states he has not missed any recent dialysis sessions.  He denies any shortness of breath, chest pain, headache, earache, sore throat, vomiting, diarrhea, dysuria but does endorse some nausea.  No rashes or recent falls or injuries.  He lives alone typically uses a walker to get around.  He has no known lung problems and does not smoke.  He has been fully vaccinated.         Past Medical History:  Diagnosis Date   Cancer ([place]) 2019   colon   Chronic kidney disease    Diabetes mellitus without complication ([place])    diet controlled   Hemodialysis access site with arteriovenous graft ([place])    Hypertension    Infection due to Clostridium septicum ([place]) 08/27/2018   Renal insufficiency    Pt on dialysis x 3 years and receives every Monday, Wednesday and Friday.     Patient Active Problem List   Diagnosis Date Noted   Abnormality of albumin 08/28/2019   Personal history of colon cancer    Polyp of colon    Infection due to Clostridium septicum ([place]) 08/27/2018   Aortitis ([place]) 07/08/2018   Acute hypoxemic respiratory failure ([place]) 06/16/2018   Colon cancer ([place]) 05/16/2018   Adenomatous polyp of colon 05/03/2018   Malignant neoplasm of ascending colon ([place]) 05/03/2018   Benign neoplasm of ascending colon    Benign neoplasm of transverse colon    Polyp of sigmoid colon    Colon neoplasm    Mass of cecum     Diarrhea    Stomach irritation    Abdominal pain 04/12/2018   Encounter for screening for respiratory tuberculosis 09/10/2017   Anemia in chronic kidney disease 04/30/2017   Coagulation defect, unspecified ([place]) 04/30/2017   Dependence on renal dialysis ([place]) 04/30/2017   CAP (community acquired pneumonia) 08/17/2016   ESRD on peritoneal dialysis ([place]) 07/13/2014   Essential hypertension 07/13/2014   Type 2 diabetes mellitus with stage 5 chronic kidney disease ([place]) 07/13/2014   Diabetic peripheral neuropathy associated with type 2 diabetes mellitus ([place]) 07/13/2014   Vitreous hemorrhage, right eye ([place]) 03/02/2014   Diabetic retinopathy ([place]) 03/02/2014    Past Surgical History:  Procedure Laterality Date   AV FISTULA PLACEMENT Left 2015   arm   CATARACT EXTRACTION W/ INTRAOCULAR LENS  IMPLANT, BILATERAL Bilateral 08/2017   CENTRAL LINE INSERTION-TUNNELED N/A 04/19/2018   Procedure: CENTRAL LINE INSERTION-TUNNELED;  Surgeon: Katha Cabal, MD;  Location: [place] [facility];  Service: Cardiovascular;  Laterality: N/A;   CENTRAL VENOUS CATHETER INSERTION  07/15/2018   Procedure: Insertion Hickman Catheter;  Surgeon: Algernon Huxley, MD;  Location: [place] [facility];  Service: Cardiovascular;;   COLON RESECTION N/A 05/16/2018   Procedure: LAPAROSCOPIC  COLON RESECTION;  Surgeon: Herbert Pun, MD;  Location: [facility];  Service: General;  Laterality: N/A;   COLONOSCOPY N/A 04/18/2018   Procedure: COLONOSCOPY;  Surgeon: Bonna Gains,  Lennette Bihari, MD;  Location: [facility];  Service: Endoscopy;  Laterality: N/A;   COLONOSCOPY WITH PROPOFOL N/A 05/22/2019   Procedure: COLONOSCOPY WITH PROPOFOL;  Surgeon: Virgel Manifold, MD;  Location: [facility];  Service: Endoscopy;  Laterality: N/A;   DIALYSIS/PERMA CATHETER REMOVAL N/A 06/19/2018   Procedure: DIALYSIS/PERMA CATHETER REMOVAL;  Surgeon: Algernon Huxley, MD;  Location: [place] [facility];  Service: Cardiovascular;   Laterality: N/A;   DIALYSIS/PERMA CATHETER REMOVAL N/A 09/24/2018   Procedure: DIALYSIS/PERMA CATHETER REMOVAL;  Surgeon: Katha Cabal, MD;  Location: [place] [facility];  Service: Cardiovascular;  Laterality: N/A;   ESOPHAGOGASTRODUODENOSCOPY N/A 04/18/2018   Procedure: ESOPHAGOGASTRODUODENOSCOPY (EGD);  Surgeon: Virgel Manifold, MD;  Location: [facility];  Service: Endoscopy;  Laterality: N/A;   EYE SURGERY     PELVIC ANGIOGRAPHY  07/15/2018   Procedure: PELVIC ANGIOGRAPHY;  Surgeon: Katha Cabal, MD;  Location: [place] [facility];  Service: Cardiovascular;;   PERIPHERAL VASCULAR BALLOON ANGIOPLASTY Bilateral 07/15/2018   Procedure: PERIPHERAL VASCULAR BALLOON ANGIOPLASTY;  Surgeon: Algernon Huxley, MD;  Location: [place] [facility];  Service: Cardiovascular;  Laterality: Bilateral;   PERIPHERAL VASCULAR CATHETERIZATION Left 01/12/2016   Procedure: A/V Shuntogram/Fistulagram;  Surgeon: Algernon Huxley, MD;  Location: [place] [facility];  Service: Cardiovascular;  Laterality: Left;   PERIPHERAL VASCULAR CATHETERIZATION N/A 01/12/2016   Procedure: A/V Shunt Intervention;  Surgeon: Algernon Huxley, MD;  Location: [place] [facility];  Service: Cardiovascular;  Laterality: N/A;   PERITONEAL CATHETER INSERTION N/A    REMOVAL OF A DIALYSIS CATHETER N/A 01/14/2015   Procedure: REMOVAL OF A  PERITONEAL DIALYSIS CATHETER;  Surgeon: Algernon Huxley, MD;  Location: [facility];  Service: Vascular;  Laterality: N/A;   TEE WITHOUT CARDIOVERSION N/A 04/19/2018   Procedure: TRANSESOPHAGEAL ECHOCARDIOGRAM (TEE);  Surgeon: Teodoro Spray, MD;  Location: [facility];  Service: Cardiovascular;  Laterality: N/A;    Prior to Admission medications   Medication Sig Start Date End Date Taking? Authorizing Provider  ferric citrate (AURYXIA) 1 GM 210 MG(Fe) tablet Take 210 mg by mouth daily.     [provider]  multivitamin (RENA-VIT) TABS tablet Take 1 tablet by mouth daily. 10/12/15   [provider]  ondansetron (ZOFRAN) 4 MG tablet Take 4 mg by mouth 3 (three) times daily as needed for nausea/vomiting. Patient not taking: Reported on 01/27/2020 06/07/18   [provider]    Allergies Patient has no known allergies.  Family History  Problem Relation Age of Onset   Diabetes Mother    Heart disease Father     Social History Social History   Tobacco Use   Smoking status: Never   Smokeless tobacco: Never  Vaping Use   Vaping Use: Never used  Substance Use Topics   Alcohol use: No   Drug use: No    Review of Systems  Review of Systems  Constitutional:  Positive for malaise/fatigue. Negative for chills and fever.  HENT:  Negative for sore throat.   Eyes:  Negative for pain.  Respiratory:  Positive for cough. Negative for stridor.   Cardiovascular:  Negative for chest pain.  Gastrointestinal:  Positive for nausea. Negative for vomiting.  Genitourinary:  Negative for dysuria.  Musculoskeletal:  Positive for myalgias.  Skin:  Negative for rash.  Neurological:  Positive for weakness. Negative for seizures, loss of consciousness and headaches.  Psychiatric/Behavioral:  Negative for suicidal ideas.   All other systems reviewed and are negative.  ____________________________________________   PHYSICAL EXAM:  VITAL SIGNS: ED Triage Vitals  Enc Vitals Group     BP 02/15/21 1059 (!) 133/59     Pulse Rate 02/15/21 1059 87     Resp 02/15/21 1059 16     Temp 02/15/21 1059 98.8 F (37.1 C)     Temp Source 02/15/21 1059 Oral     SpO2 02/15/21 1059 95 %     Weight 02/15/21 1103 210 lb (95.3 kg)     Height 02/15/21 1103 6\' 1"  (1.854 m)     Head Circumference --      Peak Flow --      Pain Score 02/15/21 1103 0     Pain Loc --      Pain Edu? --      Excl. in [place] [place]? --    Vitals:   02/15/21 1430 02/15/21 1500  BP: (!) 183/70 (!) 174/66  Pulse: 80 84  Resp: (!) 22 (!) 22  Temp:    SpO2: 97% 97%   Physical Exam Vitals and nursing note  reviewed.  Constitutional:      Appearance: He is well-developed.  HENT:     Head: Normocephalic and atraumatic.     Right Ear: External ear normal.     Left Ear: External ear normal.     Nose: Nose normal.     Mouth/Throat:     Mouth: Mucous membranes are moist.  Eyes:     Conjunctiva/sclera: Conjunctivae normal.  Cardiovascular:     Rate and Rhythm: Normal rate and regular rhythm.     Pulses: Normal pulses.     Heart sounds: No murmur heard. Pulmonary:     Effort: Pulmonary effort is normal. No respiratory distress.     Breath sounds: Normal breath sounds.  Abdominal:     Palpations: Abdomen is soft.     Tenderness: There is no abdominal tenderness.  Musculoskeletal:     Cervical back: Neck supple.  Skin:    General: Skin is warm and dry.     Capillary Refill: Capillary refill takes less than 2 seconds.  Neurological:     Mental Status: He is alert and oriented to person, place, and time.  Psychiatric:        Mood and Affect: Mood normal.    Trial of ambulation patient's SPO2 decreased to 87%.  This improved back to 97% on 2 L nasal cannula ____________________________________________   LABS (all labs ordered are listed, but only abnormal results are displayed)  Labs Reviewed  CBC WITH DIFFERENTIAL/PLATELET - Abnormal; Notable for the following components:      Result Value   WBC 11.4 (*)    RBC 3.92 (*)    Hemoglobin 12.1 (*)    HCT 35.8 (*)    Platelets 75 (*)    Lymphs Abs 7.4 (*)    All other components within normal limits  COMPREHENSIVE METABOLIC PANEL - Abnormal; Notable for the following components:   Chloride 96 (*)    BUN 53 (*)    Creatinine, Ser 9.71 (*)    Calcium 8.6 (*)    Total Protein 5.9 (*)    GFR, Estimated 5 (*)    All other components within normal limits  BRAIN NATRIURETIC PEPTIDE - Abnormal; Notable for the following components:   B Natriuretic Peptide 695.1 (*)    All other components within normal limits  TROPONIN I (HIGH  SENSITIVITY) - Abnormal; Notable for the following components:   Troponin I (High Sensitivity) 257 (*)  All other components within normal limits  MAGNESIUM  PROCALCITONIN  PATHOLOGIST SMEAR REVIEW  TROPONIN I (HIGH SENSITIVITY)   ____________________________________________  EKG  Sinus rhythm with first-degree AV block with a PR interval of 214, left axis deviation with some nonspecific change versus artifact in V1 without other clearance of acute ischemia or significant arrhythmia. ____________________________________________  RADIOLOGY  ED MD interpretation: This x-ray shows cardiomegaly and some bilateral patchiness consistent with likely multifocal pneumonia vs pulmonary edema.  No large effusion or pneumothorax.  Official radiology report(s): DG Chest Portable 1 View  Result Date: 02/15/2021 CLINICAL DATA:  COVID positive. EXAM: PORTABLE CHEST 1 VIEW COMPARISON:  07/08/2018 FINDINGS: 1258 hours. Cardiopericardial silhouette is at upper limits of normal for size. There is pulmonary vascular congestion without overt pulmonary edema. Interstitial pulmonary edema not excluded. No pleural effusion or dense focal airspace consolidation. The visualized bony structures of the thorax show no acute abnormality. Telemetry leads overlie the chest. IMPRESSION: Pulmonary vascular congestion with possible interstitial pulmonary edema. Electronically Signed   By: Misty Stanley M.D.   On: 02/15/2021 13:26    ____________________________________________   PROCEDURES  Procedure(s) performed (including Critical Care):  .Critical Care  Date/Time: 02/15/2021 3:18 PM Performed by: Lucrezia Starch, MD Authorized by: Lucrezia Starch, MD   Critical care provider statement:    Critical care time (minutes):  45   Critical care was necessary to treat or prevent imminent or life-threatening deterioration of the following conditions:  Cardiac failure and respiratory failure   Critical care was time  spent personally by me on the following activities:  Discussions with consultants, evaluation of patient's response to treatment, examination of patient, ordering and performing treatments and interventions, ordering and review of laboratory studies, ordering and review of radiographic studies, pulse oximetry, re-evaluation of patient's condition, obtaining history from patient or surrogate and review of old charts   ____________________________________________   INITIAL IMPRESSION / [place] / ED COURSE      Presents with above-stated history exam for assessment of some cough after recent diagnosis of COVID yesterday.  He did receive dialysis yesterday.  On arrival he is afebrile hemodynamically stable.  He denies any chest pain or shortness of breath.  Does not appear significantly volume overloaded on exam.  However on trial of ambulation he was noted to be hypoxic at 87% which improved back to the mid 90s on 2 L nasal cannula.  Suspect likely hypoxic respiratory failure related to acute COVID infection.  Patient has no history of heart failure and is not on diuretic but still make some urine and given findings on chest x-ray concerning for possible bilateral pulmonary edema with BNP of 695 we will give a dose of Lasix.  He denies any chest pain and his EKG has a nonspecific findings and given troponin of 257 I suspect type II NSTEMI from demand ischemia in the setting of some mild volume overload and COVID.  Unfortunately patient is thrombocytopenic today with platelets of 75 without acute anemia and thus we will defer anticoagulation at this time.  CBC shows WBC count 11.4.  BMP shows BUN and creatinine consistent with patient's known history of ESRD without any other significant electrolyte or metabolic derangements.  Magnesium unremarkable.  On reassessment patient's blood pressure is increased to 183/70.  Given concern for NSTEMI we will give a dose of labetalol as this is likely  further stressing patient's heart.  Discussed with on-call nephrologist Dr. Juleen China who will evaluate patient and likely plan  for dialysis tomorrow.  I will plan to admit to medicine service for further evaluation and management.  Will order remdesivir and Decadron given respiratory failure in the setting of COVID.       ____________________________________________   FINAL CLINICAL IMPRESSION(S) / ED DIAGNOSES  Final diagnoses:  COVID  Acute respiratory failure with hypoxia (HCC)  NSTEMI (non-ST elevated myocardial infarction) ([place])  ESRD (end stage renal disease) (HCC)    Medications  remdesivir 200 mg in sodium chloride 0.9% 250 mL IVPB (0 mg Intravenous Stopped 02/15/21 1501)    Followed by  remdesivir 100 mg in sodium chloride 0.9 % 100 mL IVPB (has no administration in time range)  furosemide (LASIX) injection 40 mg (has no administration in time range)  labetalol (NORMODYNE) injection 10 mg (has no administration in time range)  dexamethasone (DECADRON) injection 6 mg (has no administration in time range)  ondansetron (ZOFRAN-ODT) disintegrating tablet 4 mg (4 mg Oral Given 02/15/21 1241)     ED Discharge Orders     None        Note:  This document was prepared using Dragon voice recognition software and may include unintentional dictation errors.    Lucrezia Starch, MD 02/15/21 [phone]

## 2021-02-15 NOTE — H&P (Signed)
History and Physical    Jonathan Mcgrath AVW:[id number] DOB: March 31, 1941 DOA: 02/15/2021  Referring MD/NP/PA:   PCP: Pcp, No   Patient coming from:  The patient is coming from home.     Chief Complaint: Cough, shortness of breath  HPI: Jonathan Mcgrath is a 80 y.o. 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  Patient is a poor historian, history is limited.  He states that he has been having dry cough, shortness of breath, fever, chills for several days.  He has poor appetite and decreased oral intake.  He has nausea, denies vomiting, abdominal pain or diarrhea.  Denies chest pain.  No symptoms of UTI.  He was not tested positive for COVID on 02/14/2021.  Per his son and the daughter (I called them by phone), patient recently had 11 teeth pulled out.   Of note, pt has hx of aortitis.  Chart review showed that Dr. Delaine Lame of infectious disease recommended to take Augmentin 500 mg once a day indefinitely by her clinic note on 01/27/20. Pt seems to be taking amoxicillin 875 mg daily, last dose was on 7/31.   ED Course: pt was found to have WBC 11.4, troponin level 257, 246, potassium 4.2, bicarbonate of 30, creatinine 9.71, BUN 53, temperature normal, blood pressure 83/70, heart rate 89, RR 22, oxygen saturation 95% on room air.  Chest x-ray showed vascular congestion and possible interstitial edema.  Patient is admitted to [facility] bed as inpatient.  Dr. Juleen China of renal is consulted.  Review of Systems:   General: has fevers, chills, no body weight gain, has poor appetite, has fatigue HEENT: no blurry vision, hearing changes or sore throat Respiratory: has dyspnea, coughing,  no wheezing CV: no chest pain, no palpitations GI: no nausea, vomiting, abdominal pain, diarrhea,  constipation GU: no dysuria, burning on urination, increased urinary frequency, hematuria  Ext: has trace leg edema Neuro: no unilateral weakness, numbness, or tingling, no vision change or hearing loss Skin: no rash, no skin tear. MSK: No muscle spasm, no deformity, no limitation of range of movement in spin Heme: No easy bruising.  Travel history: No recent long distant travel.  Allergy: No Known Allergies  Past Medical History:  Diagnosis Date   Cancer ([place]) 2019   colon   Chronic kidney disease    Diabetes mellitus without complication ([place])    diet controlled   Hemodialysis access site with arteriovenous graft ([place])    Hypertension    Infection due to Clostridium septicum ([place]) 08/27/2018   Renal insufficiency    Pt on dialysis x 3 years and receives every Monday, Wednesday and Friday.     Past Surgical History:  Procedure Laterality Date   AV FISTULA PLACEMENT Left 2015   arm   CATARACT EXTRACTION W/ INTRAOCULAR LENS  IMPLANT, BILATERAL Bilateral 08/2017   CENTRAL LINE INSERTION-TUNNELED N/A 04/19/2018   Procedure: CENTRAL LINE INSERTION-TUNNELED;  Surgeon: Katha Cabal, MD;  Location: [facility];  Service: Cardiovascular;  Laterality: N/A;   CENTRAL VENOUS CATHETER INSERTION  07/15/2018   Procedure: Insertion Hickman Catheter;  Surgeon: Algernon Huxley, MD;  Location: [facility];  Service: Cardiovascular;;   COLON RESECTION N/A 05/16/2018   Procedure: LAPAROSCOPIC  COLON RESECTION;  Surgeon: Herbert Pun, MD;  Location: [facility];  Service: General;  Laterality: N/A;   COLONOSCOPY N/A  04/18/2018   Procedure: COLONOSCOPY;  Surgeon: Virgel Manifold, MD;  Location: [facility];  Service: Endoscopy;  Laterality: N/A;   COLONOSCOPY WITH PROPOFOL N/A 05/22/2019   Procedure: COLONOSCOPY WITH PROPOFOL;  Surgeon: Virgel Manifold, MD;  Location: [facility];  Service: Endoscopy;  Laterality: N/A;   DIALYSIS/PERMA CATHETER REMOVAL N/A  06/19/2018   Procedure: DIALYSIS/PERMA CATHETER REMOVAL;  Surgeon: Algernon Huxley, MD;  Location: [facility];  Service: Cardiovascular;  Laterality: N/A;   DIALYSIS/PERMA CATHETER REMOVAL N/A 09/24/2018   Procedure: DIALYSIS/PERMA CATHETER REMOVAL;  Surgeon: Katha Cabal, MD;  Location: [facility];  Service: Cardiovascular;  Laterality: N/A;   ESOPHAGOGASTRODUODENOSCOPY N/A 04/18/2018   Procedure: ESOPHAGOGASTRODUODENOSCOPY (EGD);  Surgeon: Virgel Manifold, MD;  Location: [facility];  Service: Endoscopy;  Laterality: N/A;   EYE SURGERY     PELVIC ANGIOGRAPHY  07/15/2018   Procedure: PELVIC ANGIOGRAPHY;  Surgeon: Katha Cabal, MD;  Location: [facility];  Service: Cardiovascular;;   PERIPHERAL VASCULAR BALLOON ANGIOPLASTY Bilateral 07/15/2018   Procedure: PERIPHERAL VASCULAR BALLOON ANGIOPLASTY;  Surgeon: Algernon Huxley, MD;  Location: [facility];  Service: Cardiovascular;  Laterality: Bilateral;   PERIPHERAL VASCULAR CATHETERIZATION Left 01/12/2016   Procedure: A/V Shuntogram/Fistulagram;  Surgeon: Algernon Huxley, MD;  Location: [facility];  Service: Cardiovascular;  Laterality: Left;   PERIPHERAL VASCULAR CATHETERIZATION N/A 01/12/2016   Procedure: A/V Shunt Intervention;  Surgeon: Algernon Huxley, MD;  Location: [facility];  Service: Cardiovascular;  Laterality: N/A;   PERITONEAL CATHETER INSERTION N/A    REMOVAL OF A DIALYSIS CATHETER N/A 01/14/2015   Procedure: REMOVAL OF A  PERITONEAL DIALYSIS CATHETER;  Surgeon: Algernon Huxley, MD;  Location: [facility];  Service: Vascular;  Laterality: N/A;   TEE WITHOUT CARDIOVERSION N/A 04/19/2018   Procedure: TRANSESOPHAGEAL ECHOCARDIOGRAM (TEE);  Surgeon: Teodoro Spray, MD;  Location: [facility];  Service: Cardiovascular;  Laterality: N/A;    Social History:  reports that he has never smoked. He has never used smokeless tobacco. He reports that he does not drink alcohol and does not use  drugs.  Family History:  Family History  Problem Relation Age of Onset   Diabetes Mother    Heart disease Father      Prior to Admission medications   Medication Sig Start Date End Date Taking? Authorizing Provider  ferric citrate (AURYXIA) 1 GM 210 MG(Fe) tablet Take 210 mg by mouth daily.     [provider]  multivitamin (RENA-VIT) TABS tablet Take 1 tablet by mouth daily. 10/12/15   [provider]  ondansetron (ZOFRAN) 4 MG tablet Take 4 mg by mouth 3 (three) times daily as needed for nausea/vomiting. Patient not taking: Reported on 01/27/2020 06/07/18   [provider]    Physical Exam: Vitals:   02/15/21 1500 02/15/21 1532 02/15/21 1800 02/15/21 1830  BP: (!) 174/66 (!) 179/60 (!) 179/70 (!) 185/66  Pulse: 84 86 83 86  Resp: (!) 22 12 16 19   Temp:      TempSrc:      SpO2: 97% 97% 95% 97%  Weight:      Height:       General: Not in acute distress HEENT:       Eyes: PERRL, EOMI, no scleral icterus.       ENT: No discharge from the ears and nose, no pharynx injection, no tonsillar enlargement.        Neck: No JVD, no  bruit, no mass felt. Heme: No neck lymph node enlargement. Cardiac: S1/S2, RRR, No murmurs, No gallops or rubs. Respiratory: No rales, wheezing, rhonchi or rubs. GI: Soft, nondistended, nontender, no rebound pain, no organomegaly, BS present. GU: No hematuria Ext: has trace leg edema bilaterally. 1+DP/PT pulse bilaterally. Musculoskeletal: No joint deformities, No joint redness or warmth, no limitation of ROM in spin. Skin: No rashes.  Neuro: Alert, oriented X3, cranial nerves II-XII grossly intact, moves all extremities normally.  Psych: Patient is not psychotic, no suicidal or hemocidal ideation.  Labs on Admission: I have personally reviewed following labs and imaging studies  CBC: Recent Labs  Lab 02/15/21 1247  WBC 11.4*  NEUTROABS 3.2  HGB 12.1*  HCT 35.8*  MCV 91.3  PLT 75*   Basic Metabolic Panel: Recent Labs   Lab 02/15/21 1247  NA 137  K 4.2  CL 96*  CO2 30  GLUCOSE 94  BUN 53*  CREATININE 9.71*  CALCIUM 8.6*  MG 2.1   GFR: Estimated Creatinine Clearance: 6.9 mL/min (A) (by C-G formula based on SCr of 9.71 mg/dL (H)). Liver Function Tests: Recent Labs  Lab 02/15/21 1247  AST 30  ALT 20  ALKPHOS 61  BILITOT 0.8  PROT 5.9*  ALBUMIN 3.5   No results for input(s): LIPASE, AMYLASE in the last 168 hours. No results for input(s): AMMONIA in the last 168 hours. Coagulation Profile: No results for input(s): INR, PROTIME in the last 168 hours. Cardiac Enzymes: No results for input(s): CKTOTAL, CKMB, CKMBINDEX, TROPONINI in the last 168 hours. BNP (last 3 results) No results for input(s): PROBNP in the last 8760 hours. HbA1C: No results for input(s): HGBA1C in the last 72 hours. CBG: No results for input(s): GLUCAP in the last 168 hours. Lipid Profile: No results for input(s): CHOL, HDL, LDLCALC, TRIG, CHOLHDL, LDLDIRECT in the last 72 hours. Thyroid Function Tests: No results for input(s): TSH, T4TOTAL, FREET4, T3FREE, THYROIDAB in the last 72 hours. Anemia Panel: No results for input(s): VITAMINB12, FOLATE, FERRITIN, TIBC, IRON, RETICCTPCT in the last 72 hours. Urine analysis:    Component Value Date/Time   COLORURINE Yellow 04/11/2014 2115   APPEARANCEUR Clear 04/11/2014 2115   LABSPEC 1.012 04/11/2014 2115   PHURINE 7.0 04/11/2014 2115   GLUCOSEU 150 mg/dL 04/11/2014 2115   HGBUR 2+ 04/11/2014 2115   BILIRUBINUR Negative 04/11/2014 2115   KETONESUR Negative 04/11/2014 2115   PROTEINUR >=500 04/11/2014 2115   NITRITE Negative 04/11/2014 2115   LEUKOCYTESUR Negative 04/11/2014 2115   Sepsis Labs: @LABRCNTIP (procalcitonin:4,lacticidven:4) )No results found for this or any previous visit (from the past 240 hour(s)).   Radiological Exams on Admission: DG Chest Portable 1 View  Result Date: 02/15/2021 CLINICAL DATA:  COVID positive. EXAM: PORTABLE CHEST 1 VIEW  COMPARISON:  07/08/2018 FINDINGS: 1258 hours. Cardiopericardial silhouette is at upper limits of normal for size. There is pulmonary vascular congestion without overt pulmonary edema. Interstitial pulmonary edema not excluded. No pleural effusion or dense focal airspace consolidation. The visualized bony structures of the thorax show no acute abnormality. Telemetry leads overlie the chest. IMPRESSION: Pulmonary vascular congestion with possible interstitial pulmonary edema. Electronically Signed   By: Misty Stanley M.D.   On: 02/15/2021 13:26     EKG: I have personally reviewed.  Sinus rhythm, QTC 418, LAD, poor R wave progression, T wave inversion only in lead III.  Assessment/Plan Principal Problem:   Acute respiratory disease due to COVID-19 virus Active Problems:   Aortitis ([place])   Essential hypertension  Anemia in ESRD (end-stage renal disease) (HCC)   ESRD (end stage renal disease) (HCC)   Elevated troponin   Thrombocytopenia (HCC)   Acute respiratory disease due to COVID-19 virus: Chest x-ray showed possible interstitial edema, but no obvious infiltration.  Patient has normal oxygen saturation at rest, but desaturated to 87% on ambulation.  -will admit to med-surg bed as inpt -Remdesivir per pharm -Solumedrol 40 mg bid -Bronchodilators -PRN Mucinex for cough - IV fluid: none -Follow-up inflammatory markers -Will ask the patient to maintain an awake prone position for 16+ hours a day, if possible, with a minimum of 2-3 hours at a time -Will attempt to maintain euvolemia to a net negative fluid status -IF patient deteriorates, will consult PCCM and ID  Aortitis ([place]): -Amoxicillin 875 mg daily  Essential hypertension: Blood pressure 183/70.  Patient is not taking medications currently -IV hydralazine as needed -Start amlodipine 5 mg daily  Anemia in ESRD (end-stage renal disease) ([place]): Hemoglobin stable, 12.1 -Follow-up with CBC  ESRD (end stage renal disease)  ([place]) -Consulted with Dr. Juleen China for dialysis  Elevated troponin: Troponin 257 --> 246, denies chest pain.  Likely due to demand ischemia or decreased clearance secondary to ESRD -Trend troponin -Aspirin 81 mg daily -Trend troponin -Check A1c, FLP  Thrombocytopenia ([place]): This is chronic issue.  Platelet is 75.  No bleeding tendency. -Follow-up with CBC    DVT ppx: SCD Code Status: Partial code (CPR okay, no intubation) per his son who is the power of attorney Family Communication:  Yes, patient's daughter and son Disposition Plan:  Anticipate discharge back to previous environment Consults called: None Admission status and Level of care: Med-Surg:     as inpt     Status is: Inpatient  Remains inpatient appropriate because:Inpatient level of care appropriate due to severity of illness  Dispo: The patient is from: Home              Anticipated d/c is to: Home              Patient currently is not medically stable to d/c.   Difficult to place patient No          Date of Service 02/15/2021    Ivor Costa [facility]     If 7PM-7AM, please contact night-coverage [email or web address] 02/15/2021, 7:08 PM

## 2021-02-15 NOTE — ED Triage Notes (Signed)
Pt to ED, COVID +. Pt states he feels chilly and no appetite, denies shob, pain, denies other complaints.  States he came here because he tested positive for COVID, denies being here for sx of COVID.  Last dialysis yesterday   Pt in NAD, RR Even and unlabored, speaking in complete sentences

## 2021-02-15 NOTE — ED Triage Notes (Signed)
Presents with fever ,chills and body aches   was tested positive for COVID yesterday at dialysis    having some SOB

## 2021-02-15 NOTE — ED Notes (Signed)
Unsuccessful IV attempt x2 in right antecubital, gauze bandages applied. Dr Creig Hines notified, IV team consult ordered.

## 2021-02-15 NOTE — Progress Notes (Signed)
[facility]  ROUNDING NOTE   Subjective:   Jonathan Mcgrath was admitted to [facility] on 02/15/2021 for COVID+, fever, chills, dialysis pt  Last hemodialysis treatment was 7/29. He has not gone to dialysis due to feeling ill. Patient states he did get dialysis yesterday but seems he was charted as missing treatment yesterday.    Objective:  Vital signs in last 24 hours:  Temp:  [98.8 F (37.1 C)] 98.8 F (37.1 C) (08/02 1059) Pulse Rate:  [80-89] 84 (08/02 1500) Resp:  [16-22] 22 (08/02 1500) BP: (133-183)/(59-91) 174/66 (08/02 1500) SpO2:  [95 %-97 %] 97 % (08/02 1500) Weight:  [95.3 kg] 95.3 kg (08/02 1103)  Weight change:  Filed Weights   02/15/21 1103  Weight: 95.3 kg    Intake/Output: No intake/output data recorded.   Intake/Output this shift:  No intake/output data recorded.  Physical Exam: General: NAD,   Head: Normocephalic, atraumatic. Moist oral mucosal membranes  Eyes: Anicteric, PERRL  Neck: Supple, trachea midline  Lungs:  Clear to auscultation  Heart: Regular rate and rhythm  Abdomen:  Soft, nontender,   Extremities:  No peripheral edema.  Neurologic: Nonfocal, moving all four extremities  Skin: No lesions  Access: Left AVF    Basic Metabolic Panel: Recent Labs  Lab 02/15/21 1247  NA 137  K 4.2  CL 96*  CO2 30  GLUCOSE 94  BUN 53*  CREATININE 9.71*  CALCIUM 8.6*  MG 2.1    Liver Function Tests: Recent Labs  Lab 02/15/21 1247  AST 30  ALT 20  ALKPHOS 61  BILITOT 0.8  PROT 5.9*  ALBUMIN 3.5   No results for input(s): LIPASE, AMYLASE in the last 168 hours. No results for input(s): AMMONIA in the last 168 hours.  CBC: Recent Labs  Lab 02/15/21 1247  WBC 11.4*  NEUTROABS 3.2  HGB 12.1*  HCT 35.8*  MCV 91.3  PLT 75*    Cardiac Enzymes: No results for input(s): CKTOTAL, CKMB, CKMBINDEX, TROPONINI in the last 168 hours.  BNP: Invalid input(s): POCBNP  CBG: No results for input(s): GLUCAP in the last 168  hours.  Microbiology: Results for orders placed or performed during the hospital encounter of 05/19/19  SARS CORONAVIRUS 2 (TAT 6-24 HRS) Nasopharyngeal Nasopharyngeal Swab     Status: None   Collection Time: 05/19/19  8:26 AM   Specimen: Nasopharyngeal Swab  Result Value Ref Range Status   SARS Coronavirus 2 NEGATIVE NEGATIVE Final    Comment: (NOTE) SARS-CoV-2 target nucleic acids are NOT DETECTED. The SARS-CoV-2 RNA is generally detectable in upper and lower respiratory specimens during the acute phase of infection. Negative results do not preclude SARS-CoV-2 infection, do not rule out co-infections with other pathogens, and should not be used as the sole basis for treatment or other patient management decisions. Negative results must be combined with clinical observations, patient history, and epidemiological information. The expected result is Negative. Fact Sheet for Patients: SugarRoll.be Fact Sheet for Healthcare Providers: https://www.woods-mathews.com/ This test is not yet approved or cleared by the [place] FDA and  has been authorized for detection and/or diagnosis of SARS-CoV-2 by FDA under an Emergency Use Authorization (EUA). This EUA will remain  in effect (meaning this test can be used) for the duration of the COVID-19 declaration under Section 56 4(b)(1) of the Act, 21 U.S.C. section 360bbb-3(b)(1), unless the authorization is terminated or revoked sooner. Performed at [facility], [place] [address], [place], [place] [address]  Coagulation Studies: No results for input(s): LABPROT, INR in the last 72 hours.  Urinalysis: No results for input(s): COLORURINE, LABSPEC, PHURINE, GLUCOSEU, HGBUR, BILIRUBINUR, KETONESUR, PROTEINUR, UROBILINOGEN, NITRITE, LEUKOCYTESUR in the last 72 hours.  Invalid input(s): APPERANCEUR    Imaging: DG Chest Portable 1 View  Result Date: 02/15/2021 CLINICAL DATA:  COVID  positive. EXAM: PORTABLE CHEST 1 VIEW COMPARISON:  07/08/2018 FINDINGS: 1258 hours. Cardiopericardial silhouette is at upper limits of normal for size. There is pulmonary vascular congestion without overt pulmonary edema. Interstitial pulmonary edema not excluded. No pleural effusion or dense focal airspace consolidation. The visualized bony structures of the thorax show no acute abnormality. Telemetry leads overlie the chest. IMPRESSION: Pulmonary vascular congestion with possible interstitial pulmonary edema. Electronically Signed   By: Misty Stanley M.D.   On: 02/15/2021 13:26     Medications:    [START ON 02/16/2021] remdesivir 100 mg in NS 100 mL      dexamethasone (DECADRON) injection  6 mg Intravenous Once   furosemide  40 mg Intravenous Once   labetalol  10 mg Intravenous Once     Assessment/ Plan:  [name] is a 80 y.o. black male with end stage renal disease on hemodialysis, hypertension, diabetes mellitus type II, history of colon cancer who is admitted to [facility] on 02/15/2021 for COVID+, fever, chills, dialysis pt  [facility] MWF [facility] Left AVF 91.5kg  End Stage Renal Disease: last dialysis was on 7/29. Plan on dialysis tomorrow. Orders prepared.   2. Hypertension: elevated 179/60. Currently off all blood pressure medications at home.   3. Anemia of chronic kidney disease: hemoglobin stable at 12.1  4. Secondary Hyperparathyroidism: labs from 7/20: PTH 881, elevated.  Lorin Picket with meals. (Patient states he was changed to [facility] as outpatient however I do not see that documented anywhere.    LOS: 0 Jonathan Mcgrath 8/2/20223:32 PM

## 2021-02-15 NOTE — ED Notes (Signed)
IV team RN at bedside.  

## 2021-02-16 ENCOUNTER — Encounter: Payer: Self-pay | Admitting: Internal Medicine

## 2021-02-16 DIAGNOSIS — J069 Acute upper respiratory infection, unspecified: Secondary | ICD-10-CM | POA: Diagnosis not present

## 2021-02-16 DIAGNOSIS — N186 End stage renal disease: Secondary | ICD-10-CM | POA: Diagnosis not present

## 2021-02-16 DIAGNOSIS — D631 Anemia in chronic kidney disease: Secondary | ICD-10-CM

## 2021-02-16 DIAGNOSIS — D696 Thrombocytopenia, unspecified: Secondary | ICD-10-CM | POA: Diagnosis not present

## 2021-02-16 DIAGNOSIS — I776 Arteritis, unspecified: Secondary | ICD-10-CM | POA: Diagnosis not present

## 2021-02-16 DIAGNOSIS — U071 COVID-19: Secondary | ICD-10-CM | POA: Diagnosis not present

## 2021-02-16 LAB — COMPREHENSIVE METABOLIC PANEL
ALT: 20 U/L (ref 0–44)
AST: 27 U/L (ref 15–41)
Albumin: 3.2 g/dL — ABNORMAL LOW (ref 3.5–5.0)
Alkaline Phosphatase: 56 U/L (ref 38–126)
Anion gap: 13 (ref 5–15)
BUN: 63 mg/dL — ABNORMAL HIGH (ref 8–23)
CO2: 28 mmol/L (ref 22–32)
Calcium: 8.3 mg/dL — ABNORMAL LOW (ref 8.9–10.3)
Chloride: 94 mmol/L — ABNORMAL LOW (ref 98–111)
Creatinine, Ser: 10.89 mg/dL — ABNORMAL HIGH (ref 0.61–1.24)
GFR, Estimated: 4 mL/min — ABNORMAL LOW (ref >60)
Glucose, Bld: 91 mg/dL (ref 70–99)
Potassium: 5.1 mmol/L (ref 3.5–5.1)
Sodium: 135 mmol/L (ref 135–145)
Total Bilirubin: 0.7 mg/dL (ref 0.3–1.2)
Total Protein: 5.6 g/dL — ABNORMAL LOW (ref 6.5–8.1)

## 2021-02-16 LAB — CBC WITH DIFFERENTIAL/PLATELET
Abs Immature Granulocytes: 0.04 10*3/uL (ref 0.00–0.07)
Basophils Absolute: 0 10*3/uL (ref 0.0–0.1)
Basophils Relative: 0 %
Eosinophils Absolute: 0 10*3/uL (ref 0.0–0.5)
Eosinophils Relative: 0 %
HCT: 35.7 % — ABNORMAL LOW (ref 39.0–52.0)
Hemoglobin: 12.2 g/dL — ABNORMAL LOW (ref 13.0–17.0)
Immature Granulocytes: 0 %
Lymphocytes Relative: 62 %
Lymphs Abs: 8 10*3/uL — ABNORMAL HIGH (ref 0.7–4.0)
MCH: 30.3 pg (ref 26.0–34.0)
MCHC: 34.2 g/dL (ref 30.0–36.0)
MCV: 88.6 fL (ref 80.0–100.0)
Monocytes Absolute: 1 10*3/uL (ref 0.1–1.0)
Monocytes Relative: 7 %
Neutro Abs: 4.1 10*3/uL (ref 1.7–7.7)
Neutrophils Relative %: 31 %
Platelets: 76 10*3/uL — ABNORMAL LOW (ref 150–400)
RBC: 4.03 MIL/uL — ABNORMAL LOW (ref 4.22–5.81)
RDW: 15 % (ref 11.5–15.5)
Smear Review: NORMAL
WBC: 13.1 10*3/uL — ABNORMAL HIGH (ref 4.0–10.5)
nRBC: 0 % (ref 0.0–0.2)

## 2021-02-16 LAB — LIPID PANEL
Cholesterol: 174 mg/dL (ref 0–200)
HDL: 33 mg/dL — ABNORMAL LOW (ref >40)
LDL Cholesterol: 126 mg/dL — ABNORMAL HIGH (ref 0–99)
Total CHOL/HDL Ratio: 5.3 RATIO
Triglycerides: 74 mg/dL (ref <150)
VLDL: 15 mg/dL (ref 0–40)

## 2021-02-16 LAB — FERRITIN
Ferritin: 139 ng/mL (ref 24–336)
Ferritin: 126 ng/mL (ref 24–336)

## 2021-02-16 LAB — TROPONIN I (HIGH SENSITIVITY): Troponin I (High Sensitivity): 250 ng/L (ref <18)

## 2021-02-16 LAB — HEMOGLOBIN A1C
Hgb A1c MFr Bld: 6 % — ABNORMAL HIGH (ref 4.8–5.6)
Mean Plasma Glucose: 125.5 mg/dL

## 2021-02-16 LAB — C-REACTIVE PROTEIN
CRP: 7.7 mg/dL — ABNORMAL HIGH (ref <1.0)
CRP: 9.7 mg/dL — ABNORMAL HIGH (ref <1.0)

## 2021-02-16 LAB — PATHOLOGIST SMEAR REVIEW

## 2021-02-16 MED ORDER — AMOXICILLIN-POT CLAVULANATE 500-125 MG PO TABS
1.0000 | ORAL_TABLET | Freq: Every day | ORAL | Status: DC
  Administered 2021-02-16: 500 mg via ORAL
  Filled 2021-02-16 (×2): qty 1

## 2021-02-16 NOTE — Progress Notes (Signed)
PT Cancellation Note  Patient Details Name: Jonathan Mcgrath MRN: [id number] DOB: Dec 12, 1940   Cancelled Treatment:    Reason Eval/Treat Not Completed: Patient at procedure or test/unavailable. PT order received, and pt chart reviewed. Pt noted to currently be OTF for dialysis. Will hold PT evaluation and re-attempt at a later date/time as available and pt medically appropriate.   Herminio Commons, PT, DPT 12:21 PM,02/16/21

## 2021-02-16 NOTE — Progress Notes (Signed)
OT Cancellation Note  Patient Details Name: Jonathan Mcgrath MRN: [id number] DOB: 02/01/1941   Cancelled Treatment:    Reason Eval/Treat Not Completed: Patient at procedure or test/ unavailable. Order received, chart reviewed. Pt noted to currently be OTF for dialysis. Will hold OT evaluation and re-attempt at a later date/time as available and pt medically appropriate.   Shara Blazing, M.S., OTR/L Ascom: [phone] 02/16/21, 11:59 AM   [name] 02/16/2021, 11:58 AM

## 2021-02-16 NOTE — Progress Notes (Signed)
[facility]  ROUNDING NOTE   Subjective:   Jonathan Mcgrath is a 80 y.o. male with past medical history of hypertension, diabetes, colon cancer, anemia and ESRD on dialysis. He presented to the ED with cough and shortness of breath.He was admitted for Thrombocytopenia ([place]) [D69.6] ESRD (end stage renal disease) ([place]) [N18.6] NSTEMI (non-ST elevated myocardial infarction) ([place]) [I21.4] Acute respiratory failure with hypoxia ([place]) [J96.01] COVID [U07.1] Acute respiratory disease due to COVID-19 virus [U07.1, J06.9]   Patient seen during dialysis   HEMODIALYSIS FLOWSHEET:  Blood Flow Rate (mL/min): 0 mL/min Arterial Pressure (mmHg): -180 mmHg Venous Pressure (mmHg): 200 mmHg Transmembrane Pressure (mmHg): 70 mmHg Ultrafiltration Rate (mL/min): 970 mL/min Dialysate Flow Rate (mL/min): 500 ml/min Conductivity: Machine : 13.8 Conductivity: Machine : 13.8 Dialysis Fluid Bolus: Normal Saline Bolus Amount (mL): 0 mL  Patient states he received dialysis treatment on Monday, but records do not show this. He appears to be resting comfortably and currently denies shortness of breath   Objective:  Vital signs in last 24 hours:  Temp:  [98.2 F (36.8 C)-98.8 F (37.1 C)] 98.5 F (36.9 C) (08/03 0757) Pulse Rate:  [62-89] 64 (08/03 0757) Resp:  [12-23] 18 (08/03 0757) BP: (133-185)/(59-91) 144/62 (08/03 0757) SpO2:  [94 %-98 %] 96 % (08/03 0757) Weight:  [90.2 kg-95.3 kg] 90.2 kg (08/02 2324)  Weight change:  Filed Weights   02/15/21 1103 02/15/21 2324  Weight: 95.3 kg 90.2 kg    Intake/Output: I/O last 3 completed shifts: In: 240 [P.O.:240] Out: -    Intake/Output this shift:  No intake/output data recorded.  Physical Exam: General: NAD, laying in bed  Head: Normocephalic, atraumatic. Moist oral mucosal membranes  Eyes: Anicteric  Lungs:  Clear to auscultation, normal effort  Heart: Regular rate and rhythm  Abdomen:  Soft, nontender  Extremities:   trace peripheral edema.  Neurologic: Nonfocal, moving all four extremities  Skin: No lesions  Access: Lt AVF    Basic Metabolic Panel: Recent Labs  Lab 02/15/21 1247 02/16/21 0503  NA 137 135  K 4.2 5.1  CL 96* 94*  CO2 30 28  GLUCOSE 94 91  BUN 53* 63*  CREATININE 9.71* 10.89*  CALCIUM 8.6* 8.3*  MG 2.1  --     Liver Function Tests: Recent Labs  Lab 02/15/21 1247 02/16/21 0503  AST 30 27  ALT 20 20  ALKPHOS 61 56  BILITOT 0.8 0.7  PROT 5.9* 5.6*  ALBUMIN 3.5 3.2*   No results for input(s): LIPASE, AMYLASE in the last 168 hours. No results for input(s): AMMONIA in the last 168 hours.  CBC: Recent Labs  Lab 02/15/21 1247 02/16/21 0503  WBC 11.4* 13.1*  NEUTROABS 3.2 4.1  HGB 12.1* 12.2*  HCT 35.8* 35.7*  MCV 91.3 88.6  PLT 75* 76*    Cardiac Enzymes: No results for input(s): CKTOTAL, CKMB, CKMBINDEX, TROPONINI in the last 168 hours.  BNP: Invalid input(s): POCBNP  CBG: No results for input(s): GLUCAP in the last 168 hours.  Microbiology: Results for orders placed or performed during the hospital encounter of 05/19/19  SARS CORONAVIRUS 2 (TAT 6-24 HRS) Nasopharyngeal Nasopharyngeal Swab     Status: None   Collection Time: 05/19/19  8:26 AM   Specimen: Nasopharyngeal Swab  Result Value Ref Range Status   SARS Coronavirus 2 NEGATIVE NEGATIVE Final    Comment: (NOTE) SARS-CoV-2 target nucleic acids are NOT DETECTED. The SARS-CoV-2 RNA is generally detectable in upper and lower respiratory specimens during the  acute phase of infection. Negative results do not preclude SARS-CoV-2 infection, do not rule out co-infections with other pathogens, and should not be used as the sole basis for treatment or other patient management decisions. Negative results must be combined with clinical observations, patient history, and epidemiological information. The expected result is Negative. Fact Sheet for Patients: SugarRoll.be Fact  Sheet for Healthcare Providers: https://www.woods-mathews.com/ This test is not yet approved or cleared by the [place] FDA and  has been authorized for detection and/or diagnosis of SARS-CoV-2 by FDA under an Emergency Use Authorization (EUA). This EUA will remain  in effect (meaning this test can be used) for the duration of the COVID-19 declaration under Section 56 4(b)(1) of the Act, 21 U.S.C. section 360bbb-3(b)(1), unless the authorization is terminated or revoked sooner. Performed at [facility], [place] [address] [place], [place] [address]     Coagulation Studies: No results for input(s): LABPROT, INR in the last 72 hours.  Urinalysis: No results for input(s): COLORURINE, LABSPEC, PHURINE, GLUCOSEU, HGBUR, BILIRUBINUR, KETONESUR, PROTEINUR, UROBILINOGEN, NITRITE, LEUKOCYTESUR in the last 72 hours.  Invalid input(s): APPERANCEUR    Imaging: DG Chest Portable 1 View  Result Date: 02/15/2021 CLINICAL DATA:  COVID positive. EXAM: PORTABLE CHEST 1 VIEW COMPARISON:  07/08/2018 FINDINGS: 1258 hours. Cardiopericardial silhouette is at upper limits of normal for size. There is pulmonary vascular congestion without overt pulmonary edema. Interstitial pulmonary edema not excluded. No pleural effusion or dense focal airspace consolidation. The visualized bony structures of the thorax show no acute abnormality. Telemetry leads overlie the chest. IMPRESSION: Pulmonary vascular congestion with possible interstitial pulmonary edema. Electronically Signed   By: Misty Stanley M.D.   On: 02/15/2021 13:26     Medications:    remdesivir 100 mg in NS 100 mL      amLODipine  5 mg Oral Daily   aspirin EC  81 mg Oral Daily   cinacalcet  60 mg Oral Q breakfast   ipratropium  2 puff Inhalation Q4H   methylPREDNISolone (SOLU-MEDROL) injection  40 mg Intravenous Q12H   multivitamin  1 tablet Oral Daily   acetaminophen (TYLENOL) oral liquid 160 mg/5 mL, albuterol,  dextromethorphan-guaiFENesin, hydrALAZINE  Assessment/ Plan:  [name] is a 80 y.o.  male with past medical history of hypertension, diabetes, colon cancer, anemia and ESRD on dialysis. He presented to the ED with cough and shortness of breath.He was admitted for Thrombocytopenia ([place]) [D69.6] ESRD (end stage renal disease) ([place]) [N18.6] NSTEMI (non-ST elevated myocardial infarction) ([place]) [I21.4] Acute respiratory failure with hypoxia ([place]) [J96.01] COVID [U07.1] Acute respiratory disease due to COVID-19 virus [U07.1, J06.9]   End stage renal disease on dialysis on dialysis Will maintain outpatient schedule, if possible Received dialysis today, UF goal of 2L achieved Next treatment scheduled for Friday   2. Anemia of chronic kidney disease  Lab Results  Component Value Date   HGB 12.2 (L) 02/16/2021  Hgb above target    3. Secondary Hyperparathyroidism:  Lab Results  Component Value Date   PTH 141 (H) 08/18/2016   CALCIUM 8.3 (L) 02/16/2021   PHOS 4.8 (H) 07/17/2018    Calcium and Phosphorus not at goal Ren[place]a with meals outpatient  4. Hypertension: 141/65, no home medications Currently taking amlodipine    LOS: 1 Hulan Szumski 8/3/20229:50 AM

## 2021-02-16 NOTE — Progress Notes (Signed)
This patient was dialyzed for 3 hours today, tolerated his treatment well, vss post Hd, hemostasis was achieved promptly, 2L net UF was removed.

## 2021-02-16 NOTE — Progress Notes (Signed)
Patient removed his top dentures during the HD tx, Raquel Sarna RN was called and made aware. Dentures were placed in a new denture container. Labeled sealed and sent back with the patient to his room at discharge from HD

## 2021-02-16 NOTE — Progress Notes (Signed)
PROGRESS NOTE                                                                                                                                                                                                             Patient Demographics:    Jonathan Mcgrath, is a 80 y.o. male, DOB - [birth year], ZSW:[id number]  Outpatient Primary MD for the patient is Pcp, No   Admit date - 02/15/2021   LOS - 1  Chief Complaint  Patient presents with   Covid Positive       Brief Narrative: Patient is a 80 y.o. 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  See below for further details.   Significant Events: 8/2>> Admit to [facility] for COVID-19 infection  Significant studies: 8/2>>Chest x-ray: Pulmonary vascular congestion  COVID-19 medications: Steroids: 8/2>> Remdesivir: 8/2>>  Antibiotics: None  Microbiology data: None  Procedures: None  Consults: None  DVT prophylaxis: SCDs Start: 02/15/21 1838    Subjective:    [name] today feels much better compared to yesterday.   Assessment  & Plan :   Acute Hypoxic Resp Failure due to Covid 19 Viral pneumonia +/- pulm edema: Clinically improved-attempt to titrate off oxygen-reassess after HD today.  In the meantime-continue steroid/Remdesivir  Fever: afebrile O2 requirements:  SpO2: 97 % O2 Flow Rate (L/min): 2 L/min   COVID-19 Labs: Recent Labs    02/15/21 1951 02/16/21 0503  DDIMER 2.08*  --   FERRITIN 139 126  CRP 7.7* 9.7*       Component Value Date/Time   BNP 695.1 (H) 02/15/2021 1247    Recent Labs  Lab 02/15/21 1247  PROCALCITON 0.48    Lab Results  Component Value Date   [place] NEGATIVE 05/19/2019    ESRD: On HD per nephrology  Normocytic anemia: Due to ESRD-defer  Aranesp/iron to nephrology  HTN: BP relatively stable-on amlodipine.  Minimally elevated troponin: Doubt any clinically significance-probably poor troponin clearance in the setting of ESRD.  Thrombocytopenia: Chronic issue-follow closely.  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 Yes; Fluid consistency: Thin  Diet effective now                    Disposition Plan  :   Status is: Inpatient  Remains inpatient appropriate because:Inpatient level of care appropriate due to severity of illness  Dispo: The patient is from: Home              Anticipated d/c is to: Home              Patient currently is not medically stable to d/c.   Difficult to place patient No    Barriers to discharge: Hypoxia requiring O2 supplementation/complete 5 days of IV Remdesivir  Antimicorbials  :    Anti-infectives (From admission, onward)    Start     Dose/Rate Route Frequency Ordered Stop   02/16/21 1800  amoxicillin-clavulanate (AUGMENTIN) 500-125 MG per tablet 500 mg        1 tablet Oral Daily 02/16/21 1217     02/16/21 1000  remdesivir 100 mg in sodium chloride 0.9 % 100 mL IVPB       See Hyperspace for full Linked Orders Report.   100 mg 200 mL/hr over 30 Minutes Intravenous Daily 02/15/21 1338 02/20/21 0959   02/15/21 1500  remdesivir 200 mg in sodium chloride 0.9% 250 mL IVPB       See Hyperspace for full Linked Orders Report.   200 mg 580 mL/hr over 30  Minutes Intravenous Once 02/15/21 1338 02/15/21 1501       Inpatient Medications  Scheduled Meds:  amLODipine  5 mg Oral Daily   amoxicillin-clavulanate  1 tablet Oral Daily   aspirin EC  81 mg Oral Daily   cinacalcet  60 mg Oral Q breakfast   ipratropium  2 puff Inhalation Q4H   methylPREDNISolone (SOLU-MEDROL) injection  40 mg Intravenous Q12H   multivitamin  1 tablet Oral Daily   Continuous Infusions:  remdesivir 100 mg in NS 100 mL     PRN Meds:.acetaminophen (TYLENOL) oral liquid 160 mg/5 mL, albuterol, dextromethorphan-guaiFENesin, hydrALAZINE   Time Spent in minutes  25  See all Orders from today for further details   Oren Binet M.D on 02/16/2021 at 3:41 PM  To page go to [email or web address] - use universal password  [facility] -  Office  [phone]    Objective:   Vitals:   02/16/21 1317 02/16/21 1320 02/16/21 1325 02/16/21 1330  BP: (!) 118/53  (!) 141/65   Pulse: 71 72 74 70  Resp: 17 20 19 18   Temp: 98.4 F (36.9 C)     TempSrc: Oral     SpO2: 97% 98% 96% 97%  Weight:      Height:        Wt Readings from Last 3 Encounters:  02/15/21 90.2 kg  01/27/20 86.6 kg  07/29/19 86.8 kg     Intake/Output Summary (Last 24 hours) at 02/16/2021 1541 Last data filed at 02/16/2021 1325 Gross per 24 hour  Intake 240 ml  Output 1999 ml  Net -1759 ml     Physical Exam Gen Exam:Alert awake-not in any distress HEENT:atraumatic, normocephalic Chest: B/L clear to auscultation anteriorly CVS:S1S2 regular Abdomen:soft non tender, non distended Extremities:no edema Neurology: Non focal Skin: no rash   Data Review:    CBC Recent Labs  Lab 02/15/21 1247 02/16/21 0503  WBC 11.4* 13.1*  HGB 12.1* 12.2*  HCT 35.8* 35.7*  PLT 75* 76*  MCV 91.3 88.6  MCH 30.9 30.3  MCHC 33.8 34.2  RDW 15.1 15.0  LYMPHSABS 7.4* 8.0*  MONOABS 0.7 1.0  EOSABS 0.1 0.0  BASOSABS 0.0 0.0    Chemistries  Recent Labs  Lab 02/15/21 1247 02/16/21 0503  NA 137  135  K 4.2 5.1  CL 96* 94*  CO2 30 28  GLUCOSE 94 91  BUN 53* 63*  CREATININE 9.71* 10.89*  CALCIUM 8.6* 8.3*  MG 2.1  --   AST 30 27  ALT 20 20  ALKPHOS 61 56  BILITOT 0.8 0.7   ------------------------------------------------------------------------------------------------------------------ Recent Labs    02/16/21 0503  CHOL 174  HDL 33*  LDLCALC 126*  TRIG 74  CHOLHDL 5.3    Lab Results  Component Value Date   HGBA1C 6.0 (H) 02/16/2021   ------------------------------------------------------------------------------------------------------------------ No results for input(s): TSH, T4TOTAL, T3FREE, THYROIDAB in the last 72 hours.  Invalid input(s): FREET3 ------------------------------------------------------------------------------------------------------------------ Recent Labs    02/15/21 1951 02/16/21 0503  FERRITIN 139 126    Coagulation profile No results for input(s): INR, PROTIME in the last 168 hours.  Recent Labs    02/15/21 1951  DDIMER 2.08*    Cardiac Enzymes No results for input(s): CKMB, TROPONINI, MYOGLOBIN in the last 168 hours.  Invalid input(s): CK ------------------------------------------------------------------------------------------------------------------    Component Value Date/Time   BNP 695.1 (H) 02/15/2021 1247    Micro Results No results found for this or any previous visit (from the past 240 hour(s)).  Radiology Reports DG Chest Portable 1 View  Result Date: 02/15/2021 CLINICAL DATA:  COVID positive. EXAM: PORTABLE CHEST 1 VIEW COMPARISON:  07/08/2018 FINDINGS: 1258 hours. Cardiopericardial silhouette is at upper limits of normal for size. There is pulmonary vascular congestion without overt pulmonary edema. Interstitial pulmonary edema not excluded. No pleural effusion or dense focal airspace consolidation. The visualized bony structures of the thorax show no acute abnormality. Telemetry leads overlie the chest.  IMPRESSION: Pulmonary vascular congestion with possible interstitial pulmonary edema. Electronically Signed   By: Misty Stanley M.D.   On: 02/15/2021 13:26

## 2021-02-16 NOTE — Progress Notes (Signed)
Per telemetry 0345  patient had 14 beats SVT rate 150, then back to sinus rhythm. Patient was asleep, & then asymptomatic when awakened. Patient denies any symptoms or discomfort. Dr. Gean Birchwood notified, no new orders received. Will continue to monitor.

## 2021-02-17 DIAGNOSIS — I776 Arteritis, unspecified: Secondary | ICD-10-CM | POA: Diagnosis not present

## 2021-02-17 DIAGNOSIS — N186 End stage renal disease: Secondary | ICD-10-CM | POA: Diagnosis not present

## 2021-02-17 DIAGNOSIS — J069 Acute upper respiratory infection, unspecified: Secondary | ICD-10-CM | POA: Diagnosis not present

## 2021-02-17 DIAGNOSIS — D696 Thrombocytopenia, unspecified: Secondary | ICD-10-CM | POA: Diagnosis not present

## 2021-02-17 DIAGNOSIS — U071 COVID-19: Secondary | ICD-10-CM | POA: Diagnosis not present

## 2021-02-17 LAB — BASIC METABOLIC PANEL
Anion gap: 16 — ABNORMAL HIGH (ref 5–15)
BUN: 60 mg/dL — ABNORMAL HIGH (ref 8–23)
CO2: 27 mmol/L (ref 22–32)
Calcium: 8.1 mg/dL — ABNORMAL LOW (ref 8.9–10.3)
Chloride: 92 mmol/L — ABNORMAL LOW (ref 98–111)
Creatinine, Ser: 8.86 mg/dL — ABNORMAL HIGH (ref 0.61–1.24)
GFR, Estimated: 6 mL/min — ABNORMAL LOW (ref >60)
Glucose, Bld: 152 mg/dL — ABNORMAL HIGH (ref 70–99)
Potassium: 4.6 mmol/L (ref 3.5–5.1)
Sodium: 135 mmol/L (ref 135–145)

## 2021-02-17 LAB — PHOSPHORUS: Phosphorus: 5.6 mg/dL — ABNORMAL HIGH (ref 2.5–4.6)

## 2021-02-17 LAB — C-REACTIVE PROTEIN: CRP: 8.2 mg/dL — ABNORMAL HIGH (ref <1.0)

## 2021-02-17 LAB — D-DIMER, QUANTITATIVE: D-Dimer, Quant: 1.81 ug/mL-FEU — ABNORMAL HIGH (ref 0.00–0.50)

## 2021-02-17 MED ORDER — PREDNISONE 10 MG PO TABS: ORAL_TABLET | ORAL | 0 refills | Status: DC

## 2021-02-17 MED ORDER — AMLODIPINE BESYLATE 5 MG PO TABS: 5.0000 mg | ORAL_TABLET | Freq: Every day | ORAL | 0 refills | Status: DC

## 2021-02-17 NOTE — Evaluation (Signed)
Physical Therapy Evaluation Patient Details Name: Jonathan Mcgrath MRN: [id number] DOB: [birth year] Today's Date: 02/17/2021   History of Present Illness  Jonathan Mcgrath is a 80 y.o. 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 (Simultaneous filing. User may not have seen previous data.)   Clinical Impression  Pt is a 80 year old M admitted to hospital on 02/15/21 for acute respiratory disease secondary to [place]. At baseline, pt was mod I with all ADL's, IADL's, driving, and community ambulation with AD. Pt presents with decreased activity tolerance and decreased standing balance, resulting in impaired functional mobility. Due to deficits, pt required supervision-CGA for transfers and gait. Pt demonstrates improved balance, assist levels, and quality of gait with use of RW vs. cane. Able to maintian SpO2 >90% throughout session RA. Deficits limit the pt's ability to safely and independently perform ADL's, transfer, and ambulate. Pt will benefit from acute skilled PT services to address deficits for return to baseline function. At this time, PT recommends DC home with HHPT and intermittent supervision, to address deficits and improve overall safety and independence with functional mobility. Will also recommend RW to improve safety/energy conservation with ADL's.     Follow Up Recommendations Home health PT;Supervision - Intermittent    Equipment Recommendations  Rolling walker with 5" wheels       Precautions / Restrictions Precautions Precautions: Fall Precaution Comments: Airborne, contact precautions. Mod fall risk. Restrictions Weight Bearing Restrictions: No      Mobility  Bed Mobility Overal bed mobility: Modified Independent    General bed  mobility comments: pt seated in recliner upon entry    Transfers Overall transfer level: Needs assistance Equipment used: Quad cane;Rolling walker (2 wheeled) Transfers: Sit to/from Stand Sit to Stand: Min guard;Supervision         General transfer comment: CGA for STS transfer from recliner with QC, progressed to supervision for safety with RW. Verbal cues for hand placement for safety.  Ambulation/Gait Ambulation/Gait assistance: Min guard;Supervision Gait Distance (Feet): 90 Feet (47ft x1 with QC, 62ft x1 with RW) Assistive device: Quad cane;Rolling walker (2 wheeled)     General Gait Details: Initially CGA to ambulate with QC due to unsteadiness, progressing to supervision for safety with use of RW. Pt demonstrates slowed cadence, early reciprocal gait, and decreased step length/foot clearance bil.     Balance Overall balance assessment: Needs assistance Sitting-balance support: Feet supported;No upper extremity supported Sitting balance-Leahy Scale: Good     Standing balance support: Single extremity supported;During functional activity;Bilateral upper extremity supported Standing balance-Leahy Scale: Good Standing balance comment: Poor balance with use of QC, progressed to fair with RW          Pertinent Vitals/Pain Pain Assessment: No/denies pain    Home Living Family/patient expects to be discharged to:: Private residence Living Arrangements: Alone Available Help at Discharge: Family;Available PRN/intermittently (Daughter comes by daily.) Type of Home: House Home Access: Level entry     Home Layout: One level Home Equipment: Kasandra Knudsen - quad;Walker - 4 wheels;Bedside commode;Shower seat;Grab bars - toilet;Hand held shower head;Grab bars - tub/shower      Prior Function Level of Independence: Independent with assistive device(s)       Comments: Ind with ADL's, IADL's, community gait with QC and driving. Takes transportation to HD MWF.     Hand Dominance  Dominant Hand: Right    Extremity/Trunk Assessment   Upper Extremity Assessment Upper Extremity Assessment: Overall WFL for tasks assessed (sensation intact)    Lower Extremity Assessment Lower Extremity Assessment: Overall WFL for tasks assessed (sensation and coordination intact)    Cervical / Trunk Assessment Cervical / Trunk Assessment: Normal  Communication   Communication: HOH  Cognition Arousal/Alertness: Awake/alert Behavior During Therapy: WFL for tasks assessed/performed Overall Cognitive Status: Within Functional Limits for tasks assessed          General Comments: A&O x4, able to follow 100% of simple 2-step commands      General Comments General comments (skin integrity, edema, etc.): Desat to 80% on RA during gait, however, unknown accuracy due to poor wavelength; able to recover quickly to >90%. Maintained >90% throughout remainder of session.    Exercises Other Exercises Other Exercises: Pt able to perform transfers and gait. Demonstrates improved balance and quality of gait with RW vs. QC Other Exercises: Pt educated regarding: PT role/POC, DC recommendations, benefits of using RW vs. rollator vs. QC, energy conservation, and safety with functional mobility. He verbalized understanding.   Assessment/Plan    PT Assessment Patient needs continued PT services  PT Problem List Decreased activity tolerance;Decreased balance;Decreased mobility       PT Treatment Interventions Gait training;Functional mobility training;Therapeutic activities;Therapeutic exercise;Balance training;Neuromuscular re-education    PT Goals (Current goals can be found in the Care Plan section)  Acute Rehab PT Goals Patient Stated Goal: to go home today PT Goal Formulation: With patient Time For Goal Achievement: 03/03/21 Potential to Achieve Goals: Good    Frequency Min 2X/week   Barriers to discharge Decreased caregiver support PRN support at home       AM-PAC PT "6  Clicks" Mobility  Outcome Measure Help needed turning from your back to your side while in a flat bed without using bedrails?: None Help needed moving from lying on your back to sitting on the side of a flat bed without using bedrails?: None Help needed moving to and from a bed to a chair (including a wheelchair)?: A Little Help needed standing up from a chair using your arms (e.g., wheelchair or bedside chair)?: A Little Help needed to walk in hospital room?: A Little Help needed climbing 3-5 steps with a railing? : A Little 6 Click Score: 20    End of Session Equipment Utilized During Treatment: Gait belt Activity Tolerance: Patient tolerated treatment well Patient left: in bed;with call bell/phone within reach (seated EOB) Nurse Communication: Mobility status PT Visit Diagnosis: Unsteadiness on feet (R26.81);Difficulty in walking, not elsewhere classified (R26.2)    Time: 5456-2563 PT Time Calculation (min) (ACUTE ONLY): 24 min   Charges:   PT Evaluation $PT Eval Low Complexity: 1 Low PT Treatments $Therapeutic Activity: 8-22 mins       Herminio Commons, PT, DPT 1:57 PM,02/17/21

## 2021-02-17 NOTE — Evaluation (Signed)
Occupational Therapy Evaluation Patient Details Name: Jonathan Mcgrath MRN: [id number] DOB: [birth year] Today's Date: 02/17/2021    History of Present Illness Jonathan Mcgrath is a 80 y.o. 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 (Simultaneous filing. User may not have seen previous data.)   Clinical Impression   Mr. Stratmann did well today, able to perform bed mobility, toileting, grooming, transfers, w/in room ambulation, all with Mod I, no O2, no SOB, using quad cane. Pt reports no falls in previous 12 months, able to manage his own ADL and most IADL. His children live nearby and drop by his home frequently. Pt states he uses an exercise bike at home, for 15 minutes each day. He drives short distances. Mr. Yaffe states he still feels somewhat "weak," but denies SOB. Recommend DC with HHOT. Pt has all needed DME at home.     Follow Up Recommendations  Home health OT    Equipment Recommendations  None recommended by OT    Recommendations for Other Services       Precautions / Restrictions Precautions Precautions: Fall Precaution Comments: Airborne, contact precautions. Mod fall risk. Restrictions Weight Bearing Restrictions: No      Mobility Bed Mobility Overal bed mobility: Modified Independent                  Transfers Overall transfer level: Modified independent Equipment used: Quad cane                  Balance Overall balance assessment: Needs assistance Sitting-balance support: Feet supported;No upper extremity supported Sitting balance-Leahy Scale: Good     Standing balance support: Single extremity supported;During functional activity Standing balance-Leahy Scale: Good Standing balance comment:  one slight LOB, from which pt was able to quickly recover. Reports no falls in previous year.                           ADL either performed or assessed with clinical judgement   ADL Overall ADL's : Modified independent                                       General ADL Comments: Toileting, grooming, dressing     Vision Baseline Vision/History: Wears glasses Patient Visual Report: No change from baseline       Perception     Praxis      Pertinent Vitals/Pain Pain Assessment: No/denies pain     Hand Dominance Right   Extremity/Trunk Assessment Upper Extremity Assessment Upper Extremity Assessment: Overall WFL for tasks assessed   Lower Extremity Assessment Lower Extremity Assessment: Overall WFL for tasks assessed       Communication Communication Communication: HOH   Cognition Arousal/Alertness: Awake/alert Behavior During Therapy: WFL for tasks assessed/performed Overall Cognitive Status: Within Functional Limits for tasks assessed                                 General Comments: very pleasant, engaged   General Comments       Exercises Other Exercises Other Exercises: Educ re: ECS, HEP, DC recs   Shoulder Instructions      Home Living Family/patient  expects to be discharged to:: Private residence Living Arrangements: Alone Available Help at Discharge: Family;Available PRN/intermittently (Daughter comes by daily.) Type of Home: House Home Access: Level entry     Home Layout: One level     Bathroom Shower/Tub: Teacher, early years/pre: Standard Bathroom Accessibility: Yes   Home Equipment: Kasandra Knudsen - quad;Walker - 4 wheels;Bedside commode;Shower seat;Grab bars - toilet;Hand held shower head;Grab bars - tub/shower          Prior Functioning/Environment Level of Independence: Independent with assistive device(s)        Comments: Ind with ADL's, IADL's, community gait with QC and driving. Takes  transportation to HD MWF.        OT Problem List: Decreased strength;Decreased activity tolerance      OT Treatment/Interventions:      OT Goals(Current goals can be found in the care plan section) Acute Rehab OT Goals Patient Stated Goal: to go home today OT Goal Formulation: With patient Time For Goal Achievement: 03/03/21 Potential to Achieve Goals: Good  OT Frequency:     Barriers to D/C:            Co-evaluation              AM-PAC OT "6 Clicks" Daily Activity     Outcome Measure Help from another person eating meals?: None Help from another person taking care of personal grooming?: None Help from another person toileting, which includes using toliet, bedpan, or urinal?: None Help from another person bathing (including washing, rinsing, drying)?: A Little Help from another person to put on and taking off regular upper body clothing?: None Help from another person to put on and taking off regular lower body clothing?: A Little 6 Click Score: 22   End of Session Equipment Utilized During Treatment: Other (comment) (quad cane)  Activity Tolerance: Patient tolerated treatment well Patient left: in chair;with call bell/phone within reach  OT Visit Diagnosis: Muscle weakness (generalized) (M62.81)                Time: 3833-3832 OT Time Calculation (min): 18 min Charges:  OT General Charges $OT Visit: 1 Visit OT Evaluation $OT Eval Low Complexity: 1 Low OT Treatments $Self Care/Home Management : 8-22 mins Josiah Lobo, PhD, MS, OTR/L 02/17/21, 1:13 PM

## 2021-02-17 NOTE — TOC Transition Note (Signed)
Transition of Care [facility]) - CM/SW Discharge Note   Patient Details  Name: Jonathan Mcgrath MRN: [id number] Date of Birth: 1941/03/12  [facility] of [facility]) CM/SW Contact:  Shelbie Hutching, RN Phone Number: 02/17/2021, 12:26 PM   Clinical Narrative:    Patient medically cleared for discharge home with home health services.  Cory with [facility] has [facility] referral for RN, PT, OT, and aide.  Patient's son will be picking him up this afternoon and transport him home.     Final next level of care: [place] Barriers to Discharge: Barriers Resolved   Patient Goals and CMS Choice Patient states their goals for this hospitalization and ongoing recovery are:: Family wants patient to get better, and have his breathing back to baseline CMS Medicare.gov Compare Post Acute Care list provided to:: Patient Choice offered to / list presented to : Patient, Adult Children  Discharge Placement                       Discharge Plan and Services   Discharge Planning Services: CM Consult Post Acute Care Choice: Home Health          DME Arranged: N/A DME Agency: NA       HH Arranged: RN, PT, OT, Nurse's Aide [place] Agency: [place] Date [place]: 02/17/21 Time [place]: 75 Representative spoke with at [place]: [place] ([place]) Interventions     Readmission Risk Interventions No flowsheet data found.

## 2021-02-17 NOTE — TOC Initial Note (Signed)
Transition of Care [facility]) - Initial/Assessment Note    Patient Details  Name: Jonathan Mcgrath MRN: [id number] Date of Birth: 21-Dec-1940  [facility]) CM/SW Contact:    Shelbie Hutching, RN Phone Number: 02/17/2021, 10:07 AM  Clinical Narrative:                 Patient admitted to the hospital with [place].  RNCM was able to speak with patient's daughter and son via phone.  Family is very involved and supportive.  Patient lives alone and walks with a walker at home.  Patient drives where he needs to go - he does use [employer] to get back and forth to dialysis MWF.  [name], daughter, does most of his shopping.   [name], patient's son, reports that patient has been to [facility] in the past and he and his sister would be supportive of patient going to SNF if recommended by PT.  They are also interested in home health services if patient returns home.   TOC will cont to follow and keep the family updated.   Expected Discharge Plan: [place] Barriers to Discharge: Continued Medical Work up   Patient Goals and CMS Choice Patient states their goals for this hospitalization and ongoing recovery are:: Family wants patient to get better, and have his breathing back to baseline CMS Medicare.gov Compare Post Acute Care list provided to:: Patient Choice offered to / list presented to : Patient, Adult Children  Expected Discharge Plan and Services Expected Discharge Plan: [place]   Discharge Planning Services: CM Consult Post Acute Care Choice: [place] arrangements for the past 2 months: Single Family Home                 DME Arranged: N/A DME Agency: NA       HH Arranged: RN, PT, OT          Prior Living Arrangements/Services Living arrangements for the past 2 months: Single Family Home Lives with:: Self Patient language and need for interpreter reviewed:: Yes Do you feel safe going back to the place where you live?: Yes      Need  for Family Participation in Patient Care: Yes (Comment) (ESRD, COVID) Care giver support system in place?: Yes (comment) Current home services: DME (RW) Criminal Activity/Legal Involvement Pertinent to Current Situation/Hospitalization: No - Comment as needed  Activities of Daily Living Home Assistive Devices/Equipment: Eyeglasses, Cane (specify quad or straight), Grab bars in shower, Hand-held shower hose, Long-handled shoehorn (quad cane) ADL Screening (condition at time of admission) Patient's cognitive ability adequate to safely complete daily activities?: Yes Is the patient deaf or have difficulty hearing?: No Does the patient have difficulty seeing, even when wearing glasses/contacts?: No Does the patient have difficulty concentrating, remembering, or making decisions?: No Patient able to express need for assistance with ADLs?: Yes Does the patient have difficulty dressing or bathing?: No Independently performs ADLs?: Yes (appropriate for developmental age) Does the patient have difficulty walking or climbing stairs?: Yes Weakness of Legs: None Weakness of Arms/Hands: None  Permission Sought/Granted Permission sought to share information with : Case Manager, Family Supports, Other (comment) Permission granted to share information with : Yes, Verbal Permission Granted  Share Information with NAME: [name] and [place] granted to share info w AGENCY: Home Health agency  Permission granted to share info w Relationship: son and daughter     Emotional Assessment       Orientation: :  Oriented to Self, Oriented to Place, Oriented to  Time, Oriented to Situation Alcohol / Substance Use: Not Applicable Psych Involvement: No (comment)  Admission diagnosis:  Thrombocytopenia ([place]) [D69.6] ESRD (end stage renal disease) ([place]) [N18.6] NSTEMI (non-ST elevated myocardial infarction) ([place]) [I21.4] Acute respiratory failure with hypoxia ([place]) [J96.01] COVID [U07.1] Acute  respiratory disease due to COVID-19 virus [U07.1, J06.9] Patient Active Problem List   Diagnosis Date Noted   Acute respiratory disease due to COVID-19 virus 02/15/2021   Anemia in ESRD (end-stage renal disease) ([place]) 02/15/2021   ESRD (end stage renal disease) ([place]) 02/15/2021   Elevated troponin 02/15/2021   Thrombocytopenia ([place]) 02/15/2021   Abnormality of albumin 08/28/2019   Personal history of colon cancer    Polyp of colon    Infection due to Clostridium septicum ([place]) 08/27/2018   Aortitis ([place]) 07/08/2018   Acute hypoxemic respiratory failure ([name]) 06/16/2018   Colon cancer ([place]) 05/16/2018   Adenomatous polyp of colon 05/03/2018   Malignant neoplasm of ascending colon ([place]) 05/03/2018   Benign neoplasm of ascending colon    Benign neoplasm of transverse colon    Polyp of sigmoid colon    Colon neoplasm    Mass of cecum    Diarrhea    Stomach irritation    Abdominal pain 04/12/2018   Encounter for screening for respiratory tuberculosis 09/10/2017   Anemia in chronic kidney disease 04/30/2017   Coagulation defect, unspecified ([place]) 04/30/2017   Dependence on renal dialysis ([place]) 04/30/2017   CAP (community acquired pneumonia) 08/17/2016   ESRD on peritoneal dialysis ([place]) 07/13/2014   Essential hypertension 07/13/2014   Type 2 diabetes mellitus with stage 5 chronic kidney disease ([place]) 07/13/2014   Diabetic peripheral neuropathy associated with type 2 diabetes mellitus ([place]) 07/13/2014   Vitreous hemorrhage, right eye ([place]) 03/02/2014   Diabetic retinopathy ([place]) 03/02/2014   PCP:  Pcp, No Pharmacy:   [facility], [place] [address] Phone: [phone] Fax: [phone]     Social Determinants of Health (SDOH) Interventions    Readmission Risk Interventions No flowsheet data found.

## 2021-02-17 NOTE — Progress Notes (Signed)
[facility]  ROUNDING NOTE   Subjective:   Jonathan Mcgrath is a 80 y.o. male with past medical history of hypertension, diabetes, colon cancer, anemia and ESRD on dialysis. He presented to the ED with cough and shortness of breath.He was admitted for Thrombocytopenia ([place]) [D69.6] ESRD (end stage renal disease) ([place]) [N18.6] NSTEMI (non-ST elevated myocardial infarction) ([place]) [I21.4] Acute respiratory failure with hypoxia ([place]) [J96.01] COVID [U07.1] Acute respiratory disease due to COVID-19 virus [U07.1, J06.9]   Patient seen laying in bed Alert and oriented Tolerating meals, denies nausea and vomiting Denies shortness of breath States he may be discharged today Tolerated dialysis well yesterday  Objective:  Vital signs in last 24 hours:  Temp:  [97.5 F (36.4 C)-98.4 F (36.9 C)] 98.2 F (36.8 C) (08/04 0757) Pulse Rate:  [59-78] 59 (08/04 0357) Resp:  [14-25] 18 (08/04 0757) BP: (86-151)/(52-75) 149/73 (08/04 0757) SpO2:  [90 %-100 %] 93 % (08/04 0918)  Weight change:  Filed Weights   02/15/21 1103 02/15/21 2324  Weight: 95.3 kg 90.2 kg    Intake/Output: I/O last 3 completed shifts: In: 240 [P.O.:240] Out: 1999 [EXBMW:4132]   Intake/Output this shift:  No intake/output data recorded.  Physical Exam: General: NAD, laying in bed  Head: Normocephalic, atraumatic. Moist oral mucosal membranes  Eyes: Anicteric  Lungs:  Clear to auscultation, normal effort  Heart: Regular rate and rhythm  Abdomen:  Soft, nontender  Extremities:  no peripheral edema.  Neurologic: Nonfocal, moving all four extremities  Skin: No lesions  Access: Lt AVF    Basic Metabolic Panel: Recent Labs  Lab 02/15/21 1247 02/16/21 0503 02/17/21 0814  NA 137 135 135  K 4.2 5.1 4.6  CL 96* 94* 92*  CO2 30 28 27   GLUCOSE 94 91 152*  BUN 53* 63* 60*  CREATININE 9.71* 10.89* 8.86*  CALCIUM 8.6* 8.3* 8.1*  MG 2.1  --   --      Liver Function Tests: Recent Labs   Lab 02/15/21 1247 02/16/21 0503  AST 30 27  ALT 20 20  ALKPHOS 61 56  BILITOT 0.8 0.7  PROT 5.9* 5.6*  ALBUMIN 3.5 3.2*    No results for input(s): LIPASE, AMYLASE in the last 168 hours. No results for input(s): AMMONIA in the last 168 hours.  CBC: Recent Labs  Lab 02/15/21 1247 02/16/21 0503  WBC 11.4* 13.1*  NEUTROABS 3.2 4.1  HGB 12.1* 12.2*  HCT 35.8* 35.7*  MCV 91.3 88.6  PLT 75* 76*     Cardiac Enzymes: No results for input(s): CKTOTAL, CKMB, CKMBINDEX, TROPONINI in the last 168 hours.  BNP: Invalid input(s): POCBNP  CBG: No results for input(s): GLUCAP in the last 168 hours.  Microbiology: Results for orders placed or performed during the hospital encounter of 05/19/19  SARS CORONAVIRUS 2 (TAT 6-24 HRS) Nasopharyngeal Nasopharyngeal Swab     Status: None   Collection Time: 05/19/19  8:26 AM   Specimen: Nasopharyngeal Swab  Result Value Ref Range Status   SARS Coronavirus 2 NEGATIVE NEGATIVE Final    Comment: (NOTE) SARS-CoV-2 target nucleic acids are NOT DETECTED. The SARS-CoV-2 RNA is generally detectable in upper and lower respiratory specimens during the acute phase of infection. Negative results do not preclude SARS-CoV-2 infection, do not rule out co-infections with other pathogens, and should not be used as the sole basis for treatment or other patient management decisions. Negative results must be combined with clinical observations, patient history, and epidemiological information. The expected result is  Negative. Fact Sheet for Patients: SugarRoll.be Fact Sheet for Healthcare Providers: https://www.woods-mathews.com/ This test is not yet approved or cleared by the [place] FDA and  has been authorized for detection and/or diagnosis of SARS-CoV-2 by FDA under an Emergency Use Authorization (EUA). This EUA will remain  in effect (meaning this test can be used) for the duration of the COVID-19  declaration under Section 56 4(b)(1) of the Act, 21 U.S.C. section 360bbb-3(b)(1), unless the authorization is terminated or revoked sooner. Performed at [facility], [place] [address], [place], [place] [address]     Coagulation Studies: No results for input(s): LABPROT, INR in the last 72 hours.  Urinalysis: No results for input(s): COLORURINE, LABSPEC, PHURINE, GLUCOSEU, HGBUR, BILIRUBINUR, KETONESUR, PROTEINUR, UROBILINOGEN, NITRITE, LEUKOCYTESUR in the last 72 hours.  Invalid input(s): APPERANCEUR    Imaging: DG Chest Portable 1 View  Result Date: 02/15/2021 CLINICAL DATA:  COVID positive. EXAM: PORTABLE CHEST 1 VIEW COMPARISON:  07/08/2018 FINDINGS: 1258 hours. Cardiopericardial silhouette is at upper limits of normal for size. There is pulmonary vascular congestion without overt pulmonary edema. Interstitial pulmonary edema not excluded. No pleural effusion or dense focal airspace consolidation. The visualized bony structures of the thorax show no acute abnormality. Telemetry leads overlie the chest. IMPRESSION: Pulmonary vascular congestion with possible interstitial pulmonary edema. Electronically Signed   By: Misty Stanley M.D.   On: 02/15/2021 13:26     Medications:    remdesivir 100 mg in NS 100 mL 100 mg (02/17/21 0917)    amLODipine  5 mg Oral Daily   amoxicillin-clavulanate  1 tablet Oral Daily   aspirin EC  81 mg Oral Daily   cinacalcet  60 mg Oral Q breakfast   ipratropium  2 puff Inhalation Q4H   methylPREDNISolone (SOLU-MEDROL) injection  40 mg Intravenous Q12H   multivitamin  1 tablet Oral Daily   acetaminophen (TYLENOL) oral liquid 160 mg/5 mL, albuterol, dextromethorphan-guaiFENesin, hydrALAZINE  Assessment/ Plan:  Jonathan Mcgrath is a 80 y.o.  male with past medical history of hypertension, diabetes, colon cancer, anemia and ESRD on dialysis. He presented to the ED with cough and shortness of breath.He was admitted for Thrombocytopenia ([place])  [D69.6] ESRD (end stage renal disease) ([place]) [N18.6] NSTEMI (non-ST elevated myocardial infarction) ([place]) [I21.4] Acute respiratory failure with hypoxia ([place]) [J96.01] COVID [U07.1] Acute respiratory disease due to COVID-19 virus [U07.1, J06.9]   End stage renal disease on dialysis on dialysis Will maintain outpatient schedule, if possible Received dialysis yesterday, UF goal of 2L achieved Next treatment scheduled for Friday, if remains inpatient Will contact Dialysis coordinator to arrange Covid dialysis treatments at discharge.   2. Anemia of chronic kidney disease  Lab Results  Component Value Date   HGB 12.2 (L) 02/16/2021  Hgb remain above target   3. Secondary Hyperparathyroidism:  Lab Results  Component Value Date   PTH 141 (H) 08/18/2016   CALCIUM 8.1 (L) 02/17/2021   PHOS 4.8 (H) 07/17/2018    Calcium and Phosphorus not at goal, Current phosphorus 5.6 [place] with meals outpatient  4. Hypertension: 149/73 no home medications Currently taking amlodipine    LOS: 2 Naketa Daddario 8/4/20229:58 AM

## 2021-02-17 NOTE — Progress Notes (Signed)
Hemodialysis patient known at [facility] MWF 10:00am. Due to COVID positive results patient will receive treatments on TTS. Clinic is unsure what time at present, however patient has been made aware that he is not to show up for treatment tomorrow, to call clinic to find out what time to come on Saturday 8/6. Called and left VM on son's phone with same message. Please contact me with any dialysis placement concerns.  Elvera Bicker Dialysis Coordinator [phone]

## 2021-02-17 NOTE — Discharge Summary (Addendum)
PATIENT DETAILS Name: Jonathan Mcgrath Age: 80 y.o. Sex: male Date of Birth: [birth year] MRN: [id number]. Admitting Physician: Ivor Costa, MD PCP:Pcp, No  Admit Date: 02/15/2021 Discharge date: 02/17/2021  Recommendations for Outpatient Follow-up:  Follow up with PCP in 1-2 weeks Please obtain CMP/CBC in one week   Admitted From:  Home  Disposition: Home with home health [place]: Yes  Equipment/Devices: None  Discharge Condition: Stable  CODE STATUS: FULL CODE  Diet recommendation:  Diet Order             Diet - low sodium heart healthy           Diet renal with fluid restriction Fluid restriction: 1200 mL Fluid; Room service appropriate? Yes; Fluid consistency: Thin  Diet effective now                    Brief Summary: Brief Narrative: Patient is a 80 y.o. 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  See below for further details.     Significant Events: 8/2>> Admit to [facility] for COVID-19 infection   Significant studies: 8/2>>Chest x-ray: Pulmonary vascular congestion   COVID-19 medications: Steroids: 8/2>> Remdesivir: 8/2>>   Antibiotics: None   Microbiology data: None   Procedures: None   Consults: None  Brief Hospital Course: Acute Hypoxic Resp Failure due to Covid 19 Viral pneumonia +/- pulm edema: Significantly improved after dialysis-and after starting steroids/Remdesivir.  Not very clear whether this was some mild pulm edema in the setting of hemodialysis causing shortness of breath or COVID-related pneumonitis-or a combination of both.  In any event-he is clinically improved-we will continue tapering steroids on discharge.  He has completed a 3-day course of Remdesivir.  He has ambulated with PT/OT-without any shortness of breath on exertion.  COVID-19  Labs:  Recent Labs    02/15/21 1951 02/16/21 0503 02/17/21 0814  DDIMER 2.08*  --  1.81*  FERRITIN 139 126  --   CRP 7.7* 9.7*  --     Lab Results  Component Value Date   [place] NEGATIVE 05/19/2019     ESRD: Resume usual outpatient HD-MWF.  Nephrology aware of plans for discharge-they will arrange for outpatient dialysis-given that he is now COVID-positive.   Normocytic anemia: Due to ESRD-defer Aranesp/iron to nephrology   HTN: BP relatively stable-started on amlodipine this hospitalization-PCP/primary nephrologist to adjust and optimize further.   Minimally elevated troponin: Doubt any clinically significance-probably poor troponin clearance in the setting of ESRD.   Thrombocytopenia: Chronic issue-follow closely.   History of aortitis: On suppressive therapy with Augmentin/amoxicillin-follows with ID in the outpatient setting   History of colon cancer-s/p colectomy   Procedures/Studies: None  Discharge Diagnoses:  Principal Problem:   Acute respiratory disease due to COVID-19 virus Active Problems:   Aortitis (HCC)   Essential hypertension   Anemia in ESRD (end-stage renal disease) (HCC)   ESRD (end stage renal disease) (HCC)   Elevated troponin   Thrombocytopenia ([place])   Discharge Instructions:    Person Under Monitoring Name: Jonathan Mcgrath  Location: [place] [address]   Infection Prevention Recommendations for Individuals Confirmed to have, or Being Evaluated for, 2019 Novel Coronavirus (COVID-19) Infection Who Receive Care at Home  Individuals who are confirmed to have, or are being evaluated for, COVID-19 should follow the prevention steps below  until a healthcare provider or local or state health department says they can return to normal activities.  Stay home except to get medical care You should restrict activities outside your home, except for getting medical care. Do not go to work, school, or  public areas, and do not use public transportation or taxis.  Call ahead before visiting your doctor Before your medical appointment, call the healthcare provider and tell them that you have, or are being evaluated for, COVID-19 infection. This will help the healthcare provider's office take steps to keep other people from getting infected. Ask your healthcare provider to call the local or state health department.  Monitor your symptoms Seek prompt medical attention if your illness is worsening (e.g., difficulty breathing). Before going to your medical appointment, call the healthcare provider and tell them that you have, or are being evaluated for, COVID-19 infection. Ask your healthcare provider to call the local or state health department.  Wear a facemask You should wear a facemask that covers your nose and mouth when you are in the same room with other people and when you visit a healthcare provider. People who live with or visit you should also wear a facemask while they are in the same room with you.  Separate yourself from other people in your home As much as possible, you should stay in a different room from other people in your home. Also, you should use a separate bathroom, if available.  Avoid sharing household items You should not share dishes, drinking glasses, cups, eating utensils, towels, bedding, or other items with other people in your home. After using these items, you should wash them thoroughly with soap and water.  Cover your coughs and sneezes Cover your mouth and nose with a tissue when you cough or sneeze, or you can cough or sneeze into your sleeve. Throw used tissues in a lined trash can, and immediately wash your hands with soap and water for at least 20 seconds or use an alcohol-based hand rub.  Wash your [facility] your hands often and thoroughly with soap and water for at least 20 seconds. You can use an alcohol-based hand sanitizer if soap and water  are not available and if your hands are not visibly dirty. Avoid touching your eyes, nose, and mouth with unwashed hands.   Prevention Steps for Caregivers and Household Members of Individuals Confirmed to have, or Being Evaluated for, COVID-19 Infection Being Cared for in the Home  If you live with, or provide care at home for, a person confirmed to have, or being evaluated for, COVID-19 infection please follow these guidelines to prevent infection:  Follow healthcare provider's instructions Make sure that you understand and can help the patient follow any healthcare provider instructions for all care.  Provide for the patient's basic needs You should help the patient with basic needs in the home and provide support for getting groceries, prescriptions, and other personal needs.  Monitor the patient's symptoms If they are getting sicker, call his or her medical provider and tell them that the patient has, or is being evaluated for, COVID-19 infection. This will help the healthcare provider's office take steps to keep other people from getting infected. Ask the healthcare provider to call the local or state health department.  Limit the number of people who have contact with the patient If possible, have only one caregiver for the patient. Other household members should stay in another home or place of residence. If this is not possible,  they should stay in another room, or be separated from the patient as much as possible. Use a separate bathroom, if available. Restrict visitors who do not have an essential need to be in the home.  Keep older adults, very young children, and other sick people away from the patient Keep older adults, very young children, and those who have compromised immune systems or chronic health conditions away from the patient. This includes people with chronic heart, lung, or kidney conditions, diabetes, and cancer.  Ensure good ventilation Make sure that  shared spaces in the home have good air flow, such as from an air conditioner or an opened window, weather permitting.  Wash your hands often Wash your hands often and thoroughly with soap and water for at least 20 seconds. You can use an alcohol based hand sanitizer if soap and water are not available and if your hands are not visibly dirty. Avoid touching your eyes, nose, and mouth with unwashed hands. Use disposable paper towels to dry your hands. If not available, use dedicated cloth towels and replace them when they become wet.  Wear a facemask and gloves Wear a disposable facemask at all times in the room and gloves when you touch or have contact with the patient's blood, body fluids, and/or secretions or excretions, such as sweat, saliva, sputum, nasal mucus, vomit, urine, or feces.  Ensure the mask fits over your nose and mouth tightly, and do not touch it during use. Throw out disposable facemasks and gloves after using them. Do not reuse. Wash your hands immediately after removing your facemask and gloves. If your personal clothing becomes contaminated, carefully remove clothing and launder. Wash your hands after handling contaminated clothing. Place all used disposable facemasks, gloves, and other waste in a lined container before disposing them with other household waste. Remove gloves and wash your hands immediately after handling these items.  Do not share dishes, glasses, or other household items with the patient Avoid sharing household items. You should not share dishes, drinking glasses, cups, eating utensils, towels, bedding, or other items with a patient who is confirmed to have, or being evaluated for, COVID-19 infection. After the person uses these items, you should wash them thoroughly with soap and water.  Wash laundry thoroughly Immediately remove and wash clothes or bedding that have blood, body fluids, and/or secretions or excretions, such as sweat, saliva, sputum,  nasal mucus, vomit, urine, or feces, on them. Wear gloves when handling laundry from the patient. Read and follow directions on labels of laundry or clothing items and detergent. In general, wash and dry with the warmest temperatures recommended on the label.  Clean all areas the individual has used often Clean all touchable surfaces, such as counters, tabletops, doorknobs, bathroom fixtures, toilets, phones, keyboards, tablets, and bedside tables, every day. Also, clean any surfaces that may have blood, body fluids, and/or secretions or excretions on them. Wear gloves when cleaning surfaces the patient has come in contact with. Use a diluted bleach solution (e.g., dilute bleach with 1 part bleach and 10 parts water) or a household disinfectant with a label that says EPA-registered for coronaviruses. To make a bleach solution at home, add 1 tablespoon of bleach to 1 quart (4 cups) of water. For a larger supply, add  cup of bleach to 1 gallon (16 cups) of water. Read labels of cleaning products and follow recommendations provided on product labels. Labels contain instructions for safe and effective use of the cleaning product including precautions you should  take when applying the product, such as wearing gloves or eye protection and making sure you have good ventilation during use of the product. Remove gloves and wash hands immediately after cleaning.  Monitor yourself for signs and symptoms of illness Caregivers and household members are considered close contacts, should monitor their health, and will be asked to limit movement outside of the home to the extent possible. Follow the monitoring steps for close contacts listed on the symptom monitoring form.   ? If you have additional questions, contact your local health department or call the epidemiologist on call at [phone] (available 24/7). ? This guidance is subject to change. For the most up-to-date guidance from CDC, please refer to  their website: YouBlogs.pl    Activity:  As tolerated with Full fall precautions use walker/cane & assistance as needed   Discharge Instructions     Call MD for:  difficulty breathing, headache or visual disturbances   Complete by: As directed    Diet - low sodium heart healthy   Complete by: As directed    Discharge instructions   Complete by: As directed    Follow with Primary MD in 1-2 weeks  Please get a complete blood count and chemistry panel checked by your Primary MD at your next visit, and again as instructed by your Primary MD.  Get Medicines reviewed and adjusted: Please take all your medications with you for your next visit with your Primary MD  Laboratory/radiological data: Please request your Primary MD to go over all hospital tests and procedure/radiological results at the follow up, please ask your Primary MD to get all Hospital records sent to his/her office.  In some cases, they will be blood work, cultures and biopsy results pending at the time of your discharge. Please request that your primary care M.D. follows up on these results.  Also Note the following: If you experience worsening of your admission symptoms, develop shortness of breath, life threatening emergency, suicidal or homicidal thoughts you must seek medical attention immediately by calling 911 or calling your MD immediately  if symptoms less severe.  You must read complete instructions/literature along with all the possible adverse reactions/side effects for all the Medicines you take and that have been prescribed to you. Take any new Medicines after you have completely understood and accpet all the possible adverse reactions/side effects.   Do not drive when taking Pain medications or sleeping medications (Benzodaizepines)  Do not take more than prescribed Pain, Sleep and Anxiety Medications. It is not advisable to combine  anxiety,sleep and pain medications without talking with your primary care practitioner  Special Instructions: If you have smoked or chewed Tobacco  in the last 2 yrs please stop smoking, stop any regular Alcohol  and or any Recreational drug use.  Wear Seat belts while driving.  Please note: You were cared for by a hospitalist during your hospital stay. Once you are discharged, your primary care physician will handle any further medical issues. Please note that NO REFILLS for any discharge medications will be authorized once you are discharged, as it is imperative that you return to your primary care physician (or establish a relationship with a primary care physician if you do not have one) for your post hospital discharge needs so that they can reassess your need for medications and monitor your lab values.   Needs at least 10 days of isolation from the day of COVID-19 diagnosis or from the day of your first positive symptoms.  Increase activity slowly   Complete by: As directed       Allergies as of 02/17/2021   No Known Allergies      Medication List     TAKE these medications    amLODipine 5 MG tablet Commonly known as: NORVASC Take 1 tablet (5 mg total) by mouth daily. Start taking on: February 18, 2021   amoxicillin 875 MG tablet Commonly known as: AMOXIL Take 875 mg by mouth daily.   cinacalcet 60 MG tablet Commonly known as: SENSIPAR Take 1 tablet by mouth daily.   multivitamin Tabs tablet Take 1 tablet by mouth daily.   ondansetron 4 MG tablet Commonly known as: ZOFRAN Take 4 mg by mouth 3 (three) times daily as needed for nausea/vomiting.   predniSONE 10 MG tablet Commonly known as: DELTASONE Take 40 mg daily for 1 day, 30 mg daily for 1 day, 20 mg daily for 1 days,10 mg daily for 1 day, then stop       ASK your doctor about these medications    sevelamer carbonate 800 MG tablet Commonly known as: RENVELA Take 1,600 mg by mouth 3 (three) times daily.         Follow-up Information     Primary care MD. Schedule an appointment as soon as possible for a visit in 1 week(s).                 No Known Allergies    Consultations:  Renal   Other Procedures/Studies: DG Chest Portable 1 View  Result Date: 02/15/2021 CLINICAL DATA:  COVID positive. EXAM: PORTABLE CHEST 1 VIEW COMPARISON:  07/08/2018 FINDINGS: 1258 hours. Cardiopericardial silhouette is at upper limits of normal for size. There is pulmonary vascular congestion without overt pulmonary edema. Interstitial pulmonary edema not excluded. No pleural effusion or dense focal airspace consolidation. The visualized bony structures of the thorax show no acute abnormality. Telemetry leads overlie the chest. IMPRESSION: Pulmonary vascular congestion with possible interstitial pulmonary edema. Electronically Signed   By: Misty Stanley M.D.   On: 02/15/2021 13:26     TODAY-DAY OF DISCHARGE:  Subjective:   Jonathan Mcgrath today has no headache,no chest abdominal pain,no new weakness tingling or numbness, feels much better wants to go home today.   Objective:   Blood pressure 123/64, pulse 69, temperature 97.9 F (36.6 C), temperature source Oral, resp. rate 18, height 6\' 1"  (1.854 m), weight 90.2 kg, SpO2 96 %.  Intake/Output Summary (Last 24 hours) at 02/17/2021 1206 Last data filed at 02/16/2021 1325 Gross per 24 hour  Intake --  Output 1999 ml  Net -1999 ml   Filed Weights   02/15/21 1103 02/15/21 2324  Weight: 95.3 kg 90.2 kg    Exam: Awake Alert, Oriented *3, No new F.N deficits, Normal affect .AT,PERRAL Supple Neck,No JVD, No cervical lymphadenopathy appriciated.  Symmetrical Chest wall movement, Good air movement bilaterally, CTAB RRR,No Gallops,Rubs or new Murmurs, No Parasternal Heave +ve B.Sounds, Abd Soft, Non tender, No organomegaly appriciated, No rebound -guarding or rigidity. No Cyanosis, Clubbing or edema, No new Rash or bruise   PERTINENT RADIOLOGIC  STUDIES: DG Chest Portable 1 View  Result Date: 02/15/2021 CLINICAL DATA:  COVID positive. EXAM: PORTABLE CHEST 1 VIEW COMPARISON:  07/08/2018 FINDINGS: 1258 hours. Cardiopericardial silhouette is at upper limits of normal for size. There is pulmonary vascular congestion without overt pulmonary edema. Interstitial pulmonary edema not excluded. No pleural effusion or dense focal airspace consolidation. The visualized bony structures of the thorax show  no acute abnormality. Telemetry leads overlie the chest. IMPRESSION: Pulmonary vascular congestion with possible interstitial pulmonary edema. Electronically Signed   By: Misty Stanley M.D.   On: 02/15/2021 13:26     PERTINENT LAB RESULTS: CBC: Recent Labs    02/15/21 1247 02/16/21 0503  WBC 11.4* 13.1*  HGB 12.1* 12.2*  HCT 35.8* 35.7*  PLT 75* 76*   CMET CMP     Component Value Date/Time   NA 135 02/17/2021 0814   NA 140 04/13/2014 0507   K 4.6 02/17/2021 0814   K 4.6 04/13/2014 0507   CL 92 (L) 02/17/2021 0814   CL 109 (H) 04/13/2014 0507   CO2 27 02/17/2021 0814   CO2 24 04/13/2014 0507   GLUCOSE 152 (H) 02/17/2021 0814   GLUCOSE 139 (H) 04/13/2014 0507   BUN 60 (H) 02/17/2021 0814   BUN 79 (H) 04/13/2014 0507   CREATININE 8.86 (H) 02/17/2021 0814   CREATININE 9.83 (H) 04/13/2014 0507   CALCIUM 8.1 (L) 02/17/2021 0814   CALCIUM 6.8 (LL) 04/13/2014 0507   PROT 5.6 (L) 02/16/2021 0503   PROT 6.4 04/11/2014 1951   ALBUMIN 3.2 (L) 02/16/2021 0503   ALBUMIN 3.2 (L) 04/11/2014 1951   AST 27 02/16/2021 0503   AST 39 (H) 04/11/2014 1951   ALT 20 02/16/2021 0503   ALT 50 04/11/2014 1951   ALKPHOS 56 02/16/2021 0503   ALKPHOS 91 04/11/2014 1951   BILITOT 0.7 02/16/2021 0503   BILITOT 0.6 04/11/2014 1951   GFRNONAA 6 (L) 02/17/2021 0814   GFRNONAA 6 (L) 04/13/2014 0507   GFRNONAA 8 (L) 06/05/2013 0604   GFRAA 7 (L) 07/29/2019 1023   GFRAA 7 (L) 04/13/2014 0507   GFRAA 9 (L) 06/05/2013 0604    GFR Estimated Creatinine  Clearance: 7.5 mL/min (A) (by C-G formula based on SCr of 8.86 mg/dL (H)). No results for input(s): LIPASE, AMYLASE in the last 72 hours. No results for input(s): CKTOTAL, CKMB, CKMBINDEX, TROPONINI in the last 72 hours. Invalid input(s): POCBNP Recent Labs    02/15/21 1951 02/17/21 0814  DDIMER 2.08* 1.81*   Recent Labs    02/16/21 0503  HGBA1C 6.0*   Recent Labs    02/16/21 0503  CHOL 174  HDL 33*  LDLCALC 126*  TRIG 74  CHOLHDL 5.3   No results for input(s): TSH, T4TOTAL, T3FREE, THYROIDAB in the last 72 hours.  Invalid input(s): [place]    02/15/21 1951 02/16/21 0503  FERRITIN 139 126   Coags: No results for input(s): INR in the last 72 hours.  Invalid input(s): PT Microbiology: No results found for this or any previous visit (from the past 240 hour(s)).  FURTHER DISCHARGE INSTRUCTIONS:  Get Medicines reviewed and adjusted: Please take all your medications with you for your next visit with your Primary MD  Laboratory/radiological data: Please request your Primary MD to go over all hospital tests and procedure/radiological results at the follow up, please ask your Primary MD to get all Hospital records sent to his/her office.  In some cases, they will be blood work, cultures and biopsy results pending at the time of your discharge. Please request that your primary care M.D. goes through all the records of your hospital data and follows up on these results.  Also Note the following: If you experience worsening of your admission symptoms, develop shortness of breath, life threatening emergency, suicidal or homicidal thoughts you must seek medical attention immediately by calling 911 or calling your MD immediately  if symptoms less severe.  You must read complete instructions/literature along with all the possible adverse reactions/side effects for all the Medicines you take and that have been prescribed to you. Take any new Medicines after you have  completely understood and accpet all the possible adverse reactions/side effects.   Do not drive when taking Pain medications or sleeping medications (Benzodaizepines)  Do not take more than prescribed Pain, Sleep and Anxiety Medications. It is not advisable to combine anxiety,sleep and pain medications without talking with your primary care practitioner  Special Instructions: If you have smoked or chewed Tobacco  in the last 2 yrs please stop smoking, stop any regular Alcohol  and or any Recreational drug use.  Wear Seat belts while driving.  Please note: You were cared for by a hospitalist during your hospital stay. Once you are discharged, your primary care physician will handle any further medical issues. Please note that NO REFILLS for any discharge medications will be authorized once you are discharged, as it is imperative that you return to your primary care physician (or establish a relationship with a primary care physician if you do not have one) for your post hospital discharge needs so that they can reassess your need for medications and monitor your lab values.  Total Time spent coordinating discharge including counseling, education and face to face time equals 35 minutes.  SignedOren Binet 02/17/2021 12:06 PM

## 2021-02-17 NOTE — Progress Notes (Signed)
Patient verbalized understanding of all discharge instructions including follow up and Saturday dialysis. OOF via w/c in stable condition.

## 2021-04-26 ENCOUNTER — Ambulatory Visit: Payer: Medicare HMO | Admitting: Physical Therapy

## 2021-04-28 ENCOUNTER — Ambulatory Visit: Payer: Medicare HMO | Admitting: Physical Therapy

## 2021-05-03 ENCOUNTER — Encounter: Payer: Medicare HMO | Admitting: Physical Therapy

## 2021-05-05 ENCOUNTER — Encounter: Payer: Medicare HMO | Admitting: Physical Therapy

## 2021-05-10 ENCOUNTER — Encounter: Payer: Medicare HMO | Admitting: Physical Therapy

## 2021-05-12 ENCOUNTER — Encounter: Payer: Medicare HMO | Admitting: Physical Therapy

## 2021-05-17 ENCOUNTER — Encounter: Payer: Medicare HMO | Admitting: Physical Therapy

## 2021-05-17 ENCOUNTER — Other Ambulatory Visit: Payer: Self-pay

## 2021-05-17 ENCOUNTER — Ambulatory Visit: Payer: Medicare HMO | Attending: Infectious Diseases | Admitting: Infectious Diseases

## 2021-05-17 ENCOUNTER — Encounter: Payer: Self-pay | Admitting: Infectious Diseases

## 2021-05-17 ENCOUNTER — Other Ambulatory Visit
Admission: RE | Admit: 2021-05-17 | Discharge: 2021-05-17 | Disposition: A | Discharge status: Home / Self Care | Payer: Medicare HMO | Attending: Infectious Diseases | Admitting: Infectious Diseases

## 2021-05-17 VITALS — BP 148/92 | HR 84 | Temp 98.1°F | Resp 16 | Ht 73.0 in | Wt 201.8 lb

## 2021-05-17 DIAGNOSIS — D649 Anemia, unspecified: Secondary | ICD-10-CM | POA: Diagnosis not present

## 2021-05-17 DIAGNOSIS — I120 Hypertensive chronic kidney disease with stage 5 chronic kidney disease or end stage renal disease: Secondary | ICD-10-CM | POA: Insufficient documentation

## 2021-05-17 DIAGNOSIS — I719 Aortic aneurysm of unspecified site, without rupture: Secondary | ICD-10-CM | POA: Insufficient documentation

## 2021-05-17 DIAGNOSIS — I776 Arteritis, unspecified: Secondary | ICD-10-CM | POA: Insufficient documentation

## 2021-05-17 DIAGNOSIS — E1122 Type 2 diabetes mellitus with diabetic chronic kidney disease: Secondary | ICD-10-CM | POA: Insufficient documentation

## 2021-05-17 DIAGNOSIS — Z992 Dependence on renal dialysis: Secondary | ICD-10-CM | POA: Diagnosis not present

## 2021-05-17 DIAGNOSIS — Z833 Family history of diabetes mellitus: Secondary | ICD-10-CM | POA: Insufficient documentation

## 2021-05-17 DIAGNOSIS — Z79899 Other long term (current) drug therapy: Secondary | ICD-10-CM | POA: Diagnosis not present

## 2021-05-17 DIAGNOSIS — N186 End stage renal disease: Secondary | ICD-10-CM | POA: Insufficient documentation

## 2021-05-17 DIAGNOSIS — Z85038 Personal history of other malignant neoplasm of large intestine: Secondary | ICD-10-CM | POA: Diagnosis not present

## 2021-05-17 DIAGNOSIS — A480 Gas gangrene: Secondary | ICD-10-CM | POA: Insufficient documentation

## 2021-05-17 DIAGNOSIS — I791 Aortitis in diseases classified elsewhere: Secondary | ICD-10-CM | POA: Diagnosis not present

## 2021-05-17 DIAGNOSIS — Z8249 Family history of ischemic heart disease and other diseases of the circulatory system: Secondary | ICD-10-CM | POA: Insufficient documentation

## 2021-05-17 DIAGNOSIS — C9110 Chronic lymphocytic leukemia of B-cell type not having achieved remission: Secondary | ICD-10-CM | POA: Insufficient documentation

## 2021-05-17 DIAGNOSIS — R200 Anesthesia of skin: Secondary | ICD-10-CM | POA: Insufficient documentation

## 2021-05-17 DIAGNOSIS — Z9049 Acquired absence of other specified parts of digestive tract: Secondary | ICD-10-CM | POA: Insufficient documentation

## 2021-05-17 DIAGNOSIS — Z8616 Personal history of COVID-19: Secondary | ICD-10-CM | POA: Insufficient documentation

## 2021-05-17 DIAGNOSIS — D696 Thrombocytopenia, unspecified: Secondary | ICD-10-CM | POA: Insufficient documentation

## 2021-05-17 LAB — CBC WITH DIFFERENTIAL/PLATELET
Abs Immature Granulocytes: 0.02 10*3/uL (ref 0.00–0.07)
Basophils Absolute: 0 10*3/uL (ref 0.0–0.1)
Basophils Relative: 0 %
Eosinophils Absolute: 0.1 10*3/uL (ref 0.0–0.5)
Eosinophils Relative: 0 %
HCT: 33.6 % — ABNORMAL LOW (ref 39.0–52.0)
Hemoglobin: 11.5 g/dL — ABNORMAL LOW (ref 13.0–17.0)
Immature Granulocytes: 0 %
Lymphocytes Relative: 82 %
Lymphs Abs: 12.7 10*3/uL — ABNORMAL HIGH (ref 0.7–4.0)
MCH: 31.5 pg (ref 26.0–34.0)
MCHC: 34.2 g/dL (ref 30.0–36.0)
MCV: 92.1 fL (ref 80.0–100.0)
Monocytes Absolute: 0.7 10*3/uL (ref 0.1–1.0)
Monocytes Relative: 4 %
Neutro Abs: 2.2 10*3/uL (ref 1.7–7.7)
Neutrophils Relative %: 14 %
Platelets: 101 10*3/uL — ABNORMAL LOW (ref 150–400)
RBC: 3.65 MIL/uL — ABNORMAL LOW (ref 4.22–5.81)
RDW: 16.6 % — ABNORMAL HIGH (ref 11.5–15.5)
Smear Review: NORMAL
WBC: 15.8 10*3/uL — ABNORMAL HIGH (ref 4.0–10.5)
nRBC: 0 % (ref 0.0–0.2)

## 2021-05-17 LAB — SEDIMENTATION RATE: Sed Rate: 17 mm/hr (ref 0–20)

## 2021-05-17 LAB — C-REACTIVE PROTEIN: CRP: <0.5 mg/dL (ref <1.0)

## 2021-05-17 NOTE — Patient Instructions (Signed)
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 Follow up 1 year- will get CRP/ESR today

## 2021-05-17 NOTE — Progress Notes (Signed)
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 He is to see me once a year but did not get an appt for July 2022. Saw his PCP Dr.Roach on 05/11/2021 who referred him back to me- As per her note he was not taking augmentin when he saw her, and she restarted on 05/11/21  But patient says he was taking the antibiotic.  He is on suppressive amox/clav therapy indefinitely after a prolonged course of IV unasyn X  2   Since his last visit with me he was admitted to the hospital in Aug 2022 for covid and got Iv remdisivir He has been doing well No pain abdomen, no diarrhea No weight loss- rather gained 10 pounds Lives on his own-drives   . Numbness feet is his problem and his PCP Dr.Roach gave gabapentin Getting dialysis regularly  The following is taken from his previous note ? Jonathan BYNUMis a 80y.o. 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  Past Medical History:  Diagnosis Date   Cancer ([facility]   colon   Chronic kidney disease    Diabetes mellitus without complication ([place]    diet controlled   [place]emod[place] with arteriovenous graft ([place]    Hypertension     [place]fection due to [place]ium [place]um [place] 08/27/2018   Renal insufficiency    Pt on dialysis x 3 years and receives every Monday, Wednesday and Friday.   CLL   Past Surgical History:  Procedure Laterality Date   AV FISTULA PLACEMENT Left 2015   arm   CATARACT EXTRACTION W/ INTRAOCULAR LENS  IMPLANT, BILATERAL Bilateral 08/2017   CENTRAL LINE INSERTION-TUNNELED N/A 04/19/2018   Procedure: CENTRAL LINE INSERTION-TUNNELED;  Surgeon: SKatha Cabal MD;  [facility];  Service: Cardiovascular;  Laterality: N/A;   CENTRAL VENOUS CATHETER INSERTION  07/15/2018   Procedure: Insertion Hickman Catheter;  Surgeon: DAlgernon Huxley MD;  [facility];  Service: Cardiovascular;;   COLON RESECTION N/A 05/16/2018   Procedure: LAPAROSCOPIC  COLON RESECTION;  Surgeon: CHerbert Pun MD;  Location: [facility];  Service: General;  Laterality: N/A;   COLONOSCOPY N/A 04/18/2018   Procedure: COLONOSCOPY;  Surgeon: TVirgel Manifold MD;  Location: [facility];  Service: Endoscopy;  Laterality: N/A;   COLONOSCOPY WITH PROPOFOL N/A 05/22/2019   Procedure: COLONOSCOPY WITH PROPOFOL;  Surgeon: TVirgel Manifold MD;  Location: [facility];  Service: Endoscopy;  Laterality: N/A;   DIALYSIS/PERMA CATHETER REMOVAL N/A 06/19/2018   Procedure: DIALYSIS/PERMA CATHETER REMOVAL;  Surgeon: DAlgernon Huxley MD;  [facility];  Service: Cardiovascular;  Laterality: N/A;   DIALYSIS/PERMA CATHETER REMOVAL N/A 09/24/2018   Procedure: DIALYSIS/PERMA CATHETER REMOVAL;  Surgeon: Katha Cabal, MD;  Location: [place] [facility];  Service: Cardiovascular;  Laterality: N/A;   ESOPHAGOGASTRODUODENOSCOPY N/A 04/18/2018   Procedure: ESOPHAGOGASTRODUODENOSCOPY (EGD);  Surgeon: Virgel Manifold, MD;  Location[facility];  Service: Endoscopy;  Laterality: N/A;   EYE SURGERY     PELVIC ANGIOGRAPHY  07/15/2018   Procedure: PELVIC ANGIOGRAPHY;  Surgeon:  Katha Cabal, MD;  Location: [place] [facility];  Service: Cardiovascular;;   PERIPHERAL VASCULAR BALLOON ANGIOPLASTY Bilateral 07/15/2018   Procedure: PERIPHERAL VASCULAR BALLOON ANGIOPLASTY;  Surgeon: Algernon Huxley, MD;  Location: [place] [facility];  Service: Cardiovascular;  Laterality: Bilateral;   PERIPHERAL VASCULAR CATHETERIZATION Left 01/12/2016   Procedure: A/V Shuntogram/Fistulagram;  Surgeon: Algernon Huxley, MD;  Location: [place] [facility];  Service: Cardiovascular;  Laterality: Left;   PERIPHERAL VASCULAR CATHETERIZATION N/A 01/12/2016   Procedure: A/V Shunt Intervention;  Surgeon: Algernon Huxley, MD;  Location: [place] [facility];  Service: Cardiovascular;  Laterality: N/A;   PERITONEAL CATHETER INSERTION N/A    REMOVAL OF A DIALYSIS CATHETER N/A 01/14/2015   Procedure: REMOVAL OF A  PERITONEAL DIALYSIS CATHETER;  Surgeon: Algernon Huxley, MD;  Location: [facility];  Service: Vascular;  Laterality: N/A;   TEE WITHOUT CARDIOVERSION N/A 04/19/2018   Procedure: TRANSESOPHAGEAL ECHOCARDIOGRAM (TEE);  Surgeon: Teodoro Spray, MD;  Location: [facility];  Service: Cardiovascular;  Laterality: N/A;    Social History   Socioeconomic History   Marital status: Widowed    Spouse name: Not on file   Number of children: 4   Years of education: Not on file   Highest education level: Not on file  Occupational History   Occupation: retired  Tobacco Use   Smoking status: Never   Smokeless tobacco: Never  Vaping Use   Vaping Use: Never used  Substance and Sexual Activity   Alcohol use: No   Drug use: No   Sexual activity: Not on file  Other Topics Concern   Not on file  Social History Narrative   Not on file   Social Determinants of Health   Financial Resource Strain: Not on file  Food Insecurity: Not on file  Transportation Needs: Not on file  Physical Activity: Not on file  Stress: Not on file  Social Connections: Not on file  Intimate Partner Violence: Not on file    Family  History  Problem Relation Age of Onset   Diabetes Mother    Heart disease Father    No Known Allergies  ? Current Outpatient Medications  Medication Sig Dispense Refill   amLODipine (NORVASC) 5 MG tablet Take 1 tablet (5 mg total) by mouth daily. 30 tablet 0   amoxicillin (AMOXIL) 875 MG tablet Take 875 mg by mouth daily.     Ergocalciferol (VITAMIN D2 PO) Take by mouth.     famotidine (PEPCID) 20 MG tablet Take 20 mg by mouth 2 (two) times daily. 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     ferric citrate (AURYXIA) 1 GM 210 MG(Fe) tablet Take 210 mg by mouth 3 (three) times daily with meals.     gabapentin (NEURONTIN) 100 MG capsule Take 100 mg by mouth at bedtime.     multivitamin (RENA-VIT) TABS tablet Take 1 tablet by mouth daily.  11   ondansetron (ZOFRAN) 4 MG tablet Take 4 mg by mouth 3 (three) times daily as needed for nausea/vomiting.     predniSONE (DELTASONE) 10 MG tablet Take 40 mg daily for 1 day, 30  mg daily for 1 day, 20 mg daily for 1 days,10 mg daily for 1 day, then stop (Patient not taking: Reported on 05/17/2021) 10 tablet 0   sevelamer carbonate (RENVELA) 800 MG tablet Take 1,600 mg by mouth 3 (three) times daily. (Patient not taking: No sig reported)     No current facility-administered medications for this visit.     Abtx:  Anti-infectives (From admission, onward)    None       REVIEW OF SYSTEMS:  Const: negative fever, negative chills, negative weight loss Eyes: negative diplopia or visual changes, negative eye pain ENT: negative coryza, negative sore throat, hard of hearing Resp: negative cough, hemoptysis, dyspnea Cards: negative for chest pain, palpitations, lower extremity edema GU: negative for frequency, dysuria and hematuria GI: no pain abdomen, no diarrhea, no nausea Skin: negative for rash and pruritus Heme: negative for easy bruising and gum/nose bleeding KA:JGOT weakness Neurolo:numbness feet  Psych: negative for feelings of anxiety, depression   Endocrine:as above Allergy/Immunology- negative for any medication or food allergies ?  Objective:  VITALS:  BP (!) 148/92   Pulse 84   Temp 98.1 F (36.7 C) (Oral)   Resp 16   Ht _0  (1.854 m)   Wt 201 lb 12.8 oz (91.5 kg)   SpO2 92%   BMI 26.62 kg/m  PHYSICAL EXAM:  General: looks well, no distress Head: Normocephalic, without obvious abnormality, atraumatic. Eyes: Conjunctivae clear, anicteric sclerae. Pupils are equal ENT did not examine Neck: Supple, symmetrical, no adenopathy, thyroid: non tender no carotid bruit and no JVD. Back: No CVA tenderness. Lungs: Clear to auscultation bilaterally. No Wheezing or Rhonchi. No rales.  Heart: L5B2, systolic murmur aortic area  Abdomen: Soft, non-tender,not distended. Bowel sounds normal. No masses, lap scar Extremities: no edema legs  Skin: No rashes or lesions. Or bruising Lymph: Cervical, supraclavicular normal. Neurologic: Grossly non-focal Pertinent Labs None recently   IMAGING RESULTS: None   ? 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  secondary to adenocarcinoma of the caecum Pt on indefinite PO augmentin 560m once a day after a prolonged course of IV unasyn  in 2019 and early 2020 Annual crp/esr being checked Discussed the improtance of being adherent to amox/clav( augmentin) every day    s/p rt hemicolectomy and primary anastomosis 05/16/18 for ca cecum Last  colonoscopy 05/22/2019 -a few polyps but no malignancy or recurrence at the anastomosis site- He has not seen GI/surgery after that Says he is too old to go for all these appts  Leucocytosis since 2013- absolute lymphocytosis- has a diagnosis of CLL (cannot see those notes in Epic as it was in 2014)  follow up with heme/onc  Thrombocytopenia since 2013 likely due to the  above Fluctuates- when he had the aortitis it had normalized due to the infection  Anemia -     Continue augmentin 5080monce a day indefinitely  He needs the new covid bivalent booster- Asked him to get the booster He got flu vacicne 2 weeks ago? ___________________________________________________ Discussed with patient Follow up 12 months

## 2021-05-19 ENCOUNTER — Encounter: Payer: Medicare HMO | Admitting: Physical Therapy

## 2021-05-24 ENCOUNTER — Telehealth: Payer: Self-pay

## 2021-05-24 ENCOUNTER — Encounter: Payer: Medicare HMO | Admitting: Physical Therapy

## 2021-05-24 NOTE — Telephone Encounter (Signed)
Attempted to call patient to verify that he is still taking Augmentin. Left vm asking that he call [facility] ID. I iwll also try to call him back again tomorrow.

## 2021-05-26 ENCOUNTER — Encounter: Payer: Medicare HMO | Admitting: Physical Therapy

## 2021-05-31 ENCOUNTER — Encounter: Payer: Medicare HMO | Admitting: Physical Therapy

## 2021-06-02 ENCOUNTER — Encounter: Payer: Medicare HMO | Admitting: Physical Therapy

## 2021-06-07 ENCOUNTER — Encounter: Payer: Medicare HMO | Admitting: Physical Therapy

## 2021-08-26 DIAGNOSIS — R110 Nausea: Secondary | ICD-10-CM | POA: Insufficient documentation

## 2021-09-06 DIAGNOSIS — M79622 Pain in left upper arm: Secondary | ICD-10-CM | POA: Insufficient documentation

## 2022-01-12 ENCOUNTER — Other Ambulatory Visit: Payer: Self-pay

## 2022-01-12 ENCOUNTER — Emergency Department: Payer: Medicare HMO

## 2022-01-12 ENCOUNTER — Observation Stay
Admission: EM | Admit: 2022-01-12 | Discharge: 2022-01-16 | Disposition: A | Discharge status: Skilled Nursing Facility | Payer: Medicare HMO | Attending: Internal Medicine | Admitting: Internal Medicine

## 2022-01-12 DIAGNOSIS — Z85038 Personal history of other malignant neoplasm of large intestine: Secondary | ICD-10-CM | POA: Insufficient documentation

## 2022-01-12 DIAGNOSIS — Y9301 Activity, walking, marching and hiking: Secondary | ICD-10-CM | POA: Diagnosis not present

## 2022-01-12 DIAGNOSIS — D631 Anemia in chronic kidney disease: Secondary | ICD-10-CM | POA: Diagnosis not present

## 2022-01-12 DIAGNOSIS — Z79899 Other long term (current) drug therapy: Secondary | ICD-10-CM | POA: Diagnosis not present

## 2022-01-12 DIAGNOSIS — Z95820 Peripheral vascular angioplasty status with implants and grafts: Secondary | ICD-10-CM | POA: Diagnosis not present

## 2022-01-12 DIAGNOSIS — S82144A Nondisplaced bicondylar fracture of right tibia, initial encounter for closed fracture: Secondary | ICD-10-CM | POA: Insufficient documentation

## 2022-01-12 DIAGNOSIS — M25561 Pain in right knee: Secondary | ICD-10-CM | POA: Diagnosis present

## 2022-01-12 DIAGNOSIS — Y92008 Other place in unspecified non-institutional (private) residence as the place of occurrence of the external cause: Secondary | ICD-10-CM | POA: Diagnosis not present

## 2022-01-12 DIAGNOSIS — S52022A Displaced fracture of olecranon process without intraarticular extension of left ulna, initial encounter for closed fracture: Secondary | ICD-10-CM | POA: Diagnosis present

## 2022-01-12 DIAGNOSIS — I10 Essential (primary) hypertension: Secondary | ICD-10-CM | POA: Diagnosis not present

## 2022-01-12 DIAGNOSIS — E1122 Type 2 diabetes mellitus with diabetic chronic kidney disease: Secondary | ICD-10-CM | POA: Diagnosis not present

## 2022-01-12 DIAGNOSIS — S82001A Unspecified fracture of right patella, initial encounter for closed fracture: Principal | ICD-10-CM | POA: Insufficient documentation

## 2022-01-12 DIAGNOSIS — S82009A Unspecified fracture of unspecified patella, initial encounter for closed fracture: Secondary | ICD-10-CM | POA: Diagnosis present

## 2022-01-12 DIAGNOSIS — D72829 Elevated white blood cell count, unspecified: Secondary | ICD-10-CM | POA: Diagnosis not present

## 2022-01-12 DIAGNOSIS — S82141A Displaced bicondylar fracture of right tibia, initial encounter for closed fracture: Secondary | ICD-10-CM

## 2022-01-12 DIAGNOSIS — Z8616 Personal history of COVID-19: Secondary | ICD-10-CM | POA: Insufficient documentation

## 2022-01-12 DIAGNOSIS — N186 End stage renal disease: Secondary | ICD-10-CM | POA: Diagnosis not present

## 2022-01-12 DIAGNOSIS — I120 Hypertensive chronic kidney disease with stage 5 chronic kidney disease or end stage renal disease: Secondary | ICD-10-CM | POA: Diagnosis not present

## 2022-01-12 DIAGNOSIS — E11319 Type 2 diabetes mellitus with unspecified diabetic retinopathy without macular edema: Secondary | ICD-10-CM | POA: Diagnosis not present

## 2022-01-12 DIAGNOSIS — Z992 Dependence on renal dialysis: Secondary | ICD-10-CM | POA: Diagnosis not present

## 2022-01-12 DIAGNOSIS — E1151 Type 2 diabetes mellitus with diabetic peripheral angiopathy without gangrene: Secondary | ICD-10-CM | POA: Diagnosis not present

## 2022-01-12 LAB — BASIC METABOLIC PANEL
Anion gap: 15 (ref 5–15)
BUN: 40 mg/dL — ABNORMAL HIGH (ref 8–23)
CO2: 27 mmol/L (ref 22–32)
Calcium: 9.2 mg/dL (ref 8.9–10.3)
Chloride: 99 mmol/L (ref 98–111)
Creatinine, Ser: 7.64 mg/dL — ABNORMAL HIGH (ref 0.61–1.24)
GFR, Estimated: 7 mL/min — ABNORMAL LOW (ref >60)
Glucose, Bld: 89 mg/dL (ref 70–99)
Potassium: 4.9 mmol/L (ref 3.5–5.1)
Sodium: 141 mmol/L (ref 135–145)

## 2022-01-12 LAB — CBC WITH DIFFERENTIAL/PLATELET
Abs Immature Granulocytes: 0.03 10*3/uL (ref 0.00–0.07)
Basophils Absolute: 0 10*3/uL (ref 0.0–0.1)
Basophils Relative: 0 %
Eosinophils Absolute: 0 10*3/uL (ref 0.0–0.5)
Eosinophils Relative: 0 %
HCT: 33.6 % — ABNORMAL LOW (ref 39.0–52.0)
Hemoglobin: 11 g/dL — ABNORMAL LOW (ref 13.0–17.0)
Immature Granulocytes: 0 %
Lymphocytes Relative: 69 %
Lymphs Abs: 11.5 10*3/uL — ABNORMAL HIGH (ref 0.7–4.0)
MCH: 30.8 pg (ref 26.0–34.0)
MCHC: 32.7 g/dL (ref 30.0–36.0)
MCV: 94.1 fL (ref 80.0–100.0)
Monocytes Absolute: 0.7 10*3/uL (ref 0.1–1.0)
Monocytes Relative: 5 %
Neutro Abs: 4.2 10*3/uL (ref 1.7–7.7)
Neutrophils Relative %: 26 %
Platelets: 84 10*3/uL — ABNORMAL LOW (ref 150–400)
RBC: 3.57 MIL/uL — ABNORMAL LOW (ref 4.22–5.81)
RDW: 14.1 % (ref 11.5–15.5)
Smear Review: NORMAL
WBC: 16.5 10*3/uL — ABNORMAL HIGH (ref 4.0–10.5)
nRBC: 0 % (ref 0.0–0.2)

## 2022-01-12 LAB — VITAMIN D 25 HYDROXY (VIT D DEFICIENCY, FRACTURES): Vit D, 25-Hydroxy: 33.7 ng/mL (ref 30–100)

## 2022-01-12 LAB — GLUCOSE, CAPILLARY: Glucose-Capillary: 94 mg/dL (ref 70–99)

## 2022-01-12 MED ORDER — INSULIN ASPART 100 UNIT/ML IJ SOLN: 0.0000 [IU] | Freq: Three times a day (TID) | INTRAMUSCULAR | Status: DC

## 2022-01-12 MED ORDER — HEPARIN SODIUM (PORCINE) 5000 UNIT/ML IJ SOLN
5000.0000 [IU] | Freq: Three times a day (TID) | INTRAMUSCULAR | Status: DC
  Administered 2022-01-12 – 2022-01-16 (×11): 5000 [IU] via SUBCUTANEOUS
  Filled 2022-01-12 (×11): qty 1

## 2022-01-12 MED ORDER — SENNOSIDES-DOCUSATE SODIUM 8.6-50 MG PO TABS
1.0000 | ORAL_TABLET | Freq: Every evening | ORAL | Status: DC | PRN
  Administered 2022-01-15: 1 via ORAL
  Filled 2022-01-12: qty 1

## 2022-01-12 MED ORDER — ACETAMINOPHEN 325 MG PO TABS
650.0000 mg | ORAL_TABLET | Freq: Four times a day (QID) | ORAL | Status: AC | PRN
  Administered 2022-01-12 – 2022-01-14 (×3): 650 mg via ORAL
  Filled 2022-01-12 (×3): qty 2

## 2022-01-12 MED ORDER — HYDRALAZINE HCL 10 MG PO TABS: 10.0000 mg | ORAL_TABLET | Freq: Four times a day (QID) | ORAL | Status: AC | PRN

## 2022-01-12 MED ORDER — ACETAMINOPHEN 650 MG RE SUPP: 650.0000 mg | Freq: Four times a day (QID) | RECTAL | Status: AC | PRN

## 2022-01-12 MED ORDER — INSULIN ASPART 100 UNIT/ML IJ SOLN: 0.0000 [IU] | Freq: Every day | INTRAMUSCULAR | Status: DC

## 2022-01-12 MED ORDER — ACETAMINOPHEN 325 MG PO TABS
650.0000 mg | ORAL_TABLET | Freq: Once | ORAL | Status: AC
  Administered 2022-01-12: 650 mg via ORAL
  Filled 2022-01-12: qty 2

## 2022-01-12 MED ORDER — FAMOTIDINE 20 MG PO TABS
20.0000 mg | ORAL_TABLET | ORAL | Status: DC
  Administered 2022-01-13: 20 mg via ORAL
  Filled 2022-01-12: qty 1

## 2022-01-12 MED ORDER — ONDANSETRON HCL 4 MG PO TABS
4.0000 mg | ORAL_TABLET | Freq: Four times a day (QID) | ORAL | Status: AC | PRN
  Administered 2022-01-13: 4 mg via ORAL
  Filled 2022-01-12 (×2): qty 1

## 2022-01-12 MED ORDER — MORPHINE SULFATE (PF) 2 MG/ML IV SOLN
1.0000 mg | INTRAVENOUS | Status: DC | PRN
  Filled 2022-01-12: qty 1

## 2022-01-12 MED ORDER — FERRIC CITRATE 1 GM 210 MG(FE) PO TABS
210.0000 mg | ORAL_TABLET | Freq: Three times a day (TID) | ORAL | Status: DC
  Administered 2022-01-13 – 2022-01-16 (×10): 210 mg via ORAL
  Filled 2022-01-12 (×14): qty 1

## 2022-01-12 MED ORDER — OXYCODONE HCL 5 MG PO TABS: 5.0000 mg | ORAL_TABLET | Freq: Four times a day (QID) | ORAL | Status: DC | PRN

## 2022-01-12 MED ORDER — ONDANSETRON HCL 4 MG/2ML IJ SOLN
4.0000 mg | Freq: Four times a day (QID) | INTRAMUSCULAR | Status: AC | PRN
  Administered 2022-01-13 – 2022-01-16 (×4): 4 mg via INTRAVENOUS
  Filled 2022-01-12 (×4): qty 2

## 2022-01-12 MED ORDER — OXYCODONE HCL 5 MG PO TABS
5.0000 mg | ORAL_TABLET | Freq: Three times a day (TID) | ORAL | Status: DC | PRN
  Administered 2022-01-12: 5 mg via ORAL
  Filled 2022-01-12: qty 1

## 2022-01-12 NOTE — Assessment & Plan Note (Signed)
-   Chronic - Patient's WBC has been elevated over the last 3 years persistently, with a peak of 22.7 on 07/29/2019 - No clinical indications for antibiotic at this time
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
## 2022-01-12 NOTE — Progress Notes (Signed)
[facility] consulted to obtain IV access. Pt currently has no IV meds/fluids or studies ordered. Spoke with Jonathan Mcgrath, pt's nurse via [employer]; educated it is best practice not to place an IV that is not currently needed to decrease infection risk and allow for vein preservation, which is especially important in renal patients.  Further educated if pt's condition changes and IV access is needed, an IV team consult should be placed at that time; if consult placed STAT please provide a comment as to why an emergent IV is needed.

## 2022-01-12 NOTE — ED Notes (Signed)
Two RN attempted to start an IV on pt.  Both attempts were unsuccessful.
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
## 2022-01-12 NOTE — Assessment & Plan Note (Signed)
-   Present admission
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
## 2022-01-12 NOTE — Assessment & Plan Note (Signed)
-   Patient states he is currently not taking antihypertensive medication - Hydralazine 10 mg p.o. every 6 hours as needed for SBP greater than 180, 4 days ordered

## 2022-01-12 NOTE — Assessment & Plan Note (Signed)
-   Routine nephrology consultation placed
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
## 2022-01-13 ENCOUNTER — Observation Stay: Payer: Medicare HMO

## 2022-01-13 DIAGNOSIS — S82001A Unspecified fracture of right patella, initial encounter for closed fracture: Secondary | ICD-10-CM | POA: Diagnosis not present

## 2022-01-13 DIAGNOSIS — S52022A Displaced fracture of olecranon process without intraarticular extension of left ulna, initial encounter for closed fracture: Secondary | ICD-10-CM | POA: Diagnosis not present

## 2022-01-13 DIAGNOSIS — N186 End stage renal disease: Secondary | ICD-10-CM | POA: Diagnosis not present

## 2022-01-13 LAB — CBC
HCT: 31.2 % — ABNORMAL LOW (ref 39.0–52.0)
Hemoglobin: 10.2 g/dL — ABNORMAL LOW (ref 13.0–17.0)
MCH: 30.7 pg (ref 26.0–34.0)
MCHC: 32.7 g/dL (ref 30.0–36.0)
MCV: 94 fL (ref 80.0–100.0)
Platelets: 80 10*3/uL — ABNORMAL LOW (ref 150–400)
RBC: 3.32 MIL/uL — ABNORMAL LOW (ref 4.22–5.81)
RDW: 13.9 % (ref 11.5–15.5)
WBC: 14.3 10*3/uL — ABNORMAL HIGH (ref 4.0–10.5)
nRBC: 0 % (ref 0.0–0.2)

## 2022-01-13 LAB — BASIC METABOLIC PANEL
Anion gap: 11 (ref 5–15)
BUN: 47 mg/dL — ABNORMAL HIGH (ref 8–23)
CO2: 30 mmol/L (ref 22–32)
Calcium: 8.9 mg/dL (ref 8.9–10.3)
Chloride: 100 mmol/L (ref 98–111)
Creatinine, Ser: 8.44 mg/dL — ABNORMAL HIGH (ref 0.61–1.24)
GFR, Estimated: 6 mL/min — ABNORMAL LOW (ref >60)
Glucose, Bld: 100 mg/dL — ABNORMAL HIGH (ref 70–99)
Potassium: 5.1 mmol/L (ref 3.5–5.1)
Sodium: 141 mmol/L (ref 135–145)

## 2022-01-13 LAB — GLUCOSE, CAPILLARY
Glucose-Capillary: 111 mg/dL — ABNORMAL HIGH (ref 70–99)
Glucose-Capillary: 130 mg/dL — ABNORMAL HIGH (ref 70–99)
Glucose-Capillary: 107 mg/dL — ABNORMAL HIGH (ref 70–99)

## 2022-01-13 LAB — HEPATITIS C ANTIBODY: HCV Ab: NONREACTIVE

## 2022-01-13 LAB — PATHOLOGIST SMEAR REVIEW

## 2022-01-13 LAB — HEPATITIS B SURFACE ANTIBODY,QUALITATIVE: Hep B S Ab: REACTIVE — AB

## 2022-01-13 LAB — HEPATITIS B SURFACE ANTIGEN: Hepatitis B Surface Ag: NONREACTIVE

## 2022-01-13 LAB — HEPATITIS B CORE ANTIBODY, TOTAL: Hep B Core Total Ab: NONREACTIVE

## 2022-01-13 MED ORDER — CHLORHEXIDINE GLUCONATE CLOTH 2 % EX PADS
6.0000 | MEDICATED_PAD | Freq: Every day | CUTANEOUS | Status: DC
Start: 2022-01-13 — End: 2022-01-16
  Administered 2022-01-13 – 2022-01-16 (×3): 6 via TOPICAL

## 2022-01-13 MED ORDER — HEPARIN SODIUM (PORCINE) 1000 UNIT/ML DIALYSIS: 1000.0000 [IU] | INTRAMUSCULAR | Status: DC | PRN

## 2022-01-13 MED ORDER — RENA-VITE PO TABS
1.0000 | ORAL_TABLET | Freq: Every day | ORAL | Status: DC
Start: 2022-01-13 — End: 2022-01-16
  Administered 2022-01-13 – 2022-01-16 (×4): 1 via ORAL
  Filled 2022-01-13 (×4): qty 1

## 2022-01-13 MED ORDER — PENTAFLUOROPROP-TETRAFLUOROETH EX AERO: 1.0000 | INHALATION_SPRAY | CUTANEOUS | Status: DC | PRN

## 2022-01-13 MED ORDER — ALTEPLASE 2 MG IJ SOLR: 2.0000 mg | Freq: Once | INTRAMUSCULAR | Status: DC | PRN

## 2022-01-13 MED ORDER — LIDOCAINE-PRILOCAINE 2.5-2.5 % EX CREA: 1.0000 | TOPICAL_CREAM | CUTANEOUS | Status: DC | PRN

## 2022-01-13 MED ORDER — LIDOCAINE HCL (PF) 1 % IJ SOLN: 5.0000 mL | INTRAMUSCULAR | Status: DC | PRN

## 2022-01-13 MED ORDER — OXYCODONE HCL 5 MG PO TABS
5.0000 mg | ORAL_TABLET | Freq: Four times a day (QID) | ORAL | Status: DC | PRN
  Administered 2022-01-13 – 2022-01-16 (×6): 5 mg via ORAL
  Filled 2022-01-13 (×7): qty 1

## 2022-01-13 MED ORDER — GABAPENTIN 100 MG PO CAPS
100.0000 mg | ORAL_CAPSULE | Freq: Every day | ORAL | Status: DC
  Administered 2022-01-13 – 2022-01-15 (×3): 100 mg via ORAL
  Filled 2022-01-13 (×3): qty 1

## 2022-01-13 MED ORDER — AMLODIPINE BESYLATE 5 MG PO TABS
5.0000 mg | ORAL_TABLET | Freq: Every day | ORAL | Status: DC
  Administered 2022-01-13 – 2022-01-15 (×3): 5 mg via ORAL
  Filled 2022-01-13 (×4): qty 1

## 2022-01-13 NOTE — Progress Notes (Signed)
Patient attends 3 hour hemodialysis session, Left Upper  Arm AVF intact no concerns with cannulation maintained prescribed blood flow rate. Targeted UF met, with 1.5 liter fluid removal.  Minimal bleeding post treatment, patient hemodynamically stable to return to unit. Handoff given to primary nurse.
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
## 2022-01-13 NOTE — TOC Initial Note (Addendum)
Transition of Care [facility]) - Initial/Assessment Note    Patient Details  Name: Jonathan Mcgrath MRN: [id number] Date of Birth: 1940-11-08  Transition of Care [facility] CM/SW Contact:    Candie Chroman, LCSW Phone Number: 01/13/2022, 2:09 PM  Clinical Narrative:    Patient currently in HD. Called HD coordinator who asked patient for permission to send out SNF referral. Patient is agreeable. [facility] is first preference. Left message for admissions coordinator to notify. CMA started insurance authorization with pending facility.              4:00 pm: [facility] unable to offer a bed due to liability insurance concerns due to reason patient was brought in. [facility] does not think this will be an issue but did not realize he was an HD patient when they offered a bed so they asked them to find out cost of transport to HD. Patient is aware. He said he is familiar with [facility]. [facility] can potentially offer if son has not filed a Warehouse manager with his insurance and his home medication Lorin Picket can be switched to something cheaper. MD and pharmacy are discussing Renvela or its generic. Tried calling son to find out if he filed a liability claim but no answer.  4:17 pm: Received call back from son. No plans to file anything with liability insurance. Notified [facility] but they cannot transport to HD in [place]. Can only do [place]. [facility] is still checking cost to transport to HD. [facility] and [facility] still cannot offer even though no plan to file liability. [facility] and [facility] are not in network with [employer]  Expected Discharge Plan: Skilled Nursing Facility Barriers to Discharge: Ship broker, SNF Pending bed offer   Patient Goals and CMS Choice        Expected Discharge Plan and Services Expected Discharge Plan: [place] Choice: [place] arrangements for the past 2 months: Single Family Home                                      Prior Living Arrangements/Services Living arrangements for the past 2 months: Single Family Home Lives with:: Self Patient language and need for interpreter reviewed:: Yes Do you feel safe going back to the place where you live?: Yes      Need for Family Participation in Patient Care: Yes (Comment) Care giver support system in place?: Yes (comment)   Criminal Activity/Legal Involvement Pertinent to Current Situation/Hospitalization: No - Comment as needed  Activities of Daily Living Home Assistive Devices/Equipment: Cane (specify quad or straight) ADL Screening (condition at time of admission) Patient's cognitive ability adequate to safely complete daily activities?: Yes Is the patient deaf or have difficulty hearing?: No Does the patient have difficulty seeing, even when wearing glasses/contacts?: No Does the patient have difficulty concentrating, remembering, or making decisions?: No Patient able to express need for assistance with ADLs?: No Does the patient have difficulty dressing or bathing?: No Independently performs ADLs?: Yes (appropriate for developmental age) Does the patient have difficulty walking or climbing stairs?: Yes Weakness of Legs: Both Weakness of Arms/Hands: Left  Permission Sought/Granted Permission sought to share information with : Facility Art therapist granted to share information with : Yes, Verbal Permission Granted     Permission granted  to share info w AGENCY: SNF's        Emotional Assessment Appearance:: Appears stated age Attitude/Demeanor/Rapport: Engaged, Gracious Affect (typically observed): Accepting, Appropriate, Calm, Pleasant Orientation: : Oriented to Self, Oriented to Place, Oriented to  Time, Oriented to Situation Alcohol / Substance Use: Not Applicable Psych Involvement: No  (comment)  Admission diagnosis:  Patellar fracture [S82.009A] Closed fracture of olecranon process of left ulna, initial encounter [S52.022A] Closed fracture of right tibial plateau, initial encounter [S82.141A] Patient Active Problem List   Diagnosis Date Noted   Patellar fracture 01/12/2022   Closed fracture of left olecranon process 01/12/2022   Leukocytosis 01/12/2022   Acute respiratory disease due to COVID-19 virus 02/15/2021   Anemia in ESRD (end-stage renal disease) ([place]) 02/15/2021   ESRD (end stage renal disease) ([place]) 02/15/2021   Elevated troponin 02/15/2021   Thrombocytopenia (HCC) 02/15/2021   Abnormality of albumin 08/28/2019   Personal history of colon cancer    Polyp of colon    Infection due to Clostridium septicum ([place]) 08/27/2018   Aortitis ([place]) 07/08/2018   Acute hypoxemic respiratory failure (HCC) 06/16/2018   Colon cancer ([place]) 05/16/2018   Adenomatous polyp of colon 05/03/2018   Malignant neoplasm of ascending colon ([place]) 05/03/2018   Benign neoplasm of ascending colon    Benign neoplasm of transverse colon    Polyp of sigmoid colon    Colon neoplasm    Mass of cecum    Diarrhea    Stomach irritation    Abdominal pain 04/12/2018   Encounter for screening for respiratory tuberculosis 09/10/2017   Anemia in chronic kidney disease 04/30/2017   Coagulation defect, unspecified ([place]) 04/30/2017   Dependence on renal dialysis ([place]) 04/30/2017   CAP (community acquired pneumonia) 08/17/2016   ESRD on peritoneal dialysis ([place]) 07/13/2014   Essential hypertension 07/13/2014   Type 2 diabetes mellitus with stage 5 chronic kidney disease ([place]) 07/13/2014   Diabetic peripheral neuropathy associated with type 2 diabetes mellitus ([place]) 07/13/2014   Vitreous hemorrhage, right eye ([place]) 03/02/2014   Diabetic retinopathy ([place]) 03/02/2014   PCP:  Pcp, No Pharmacy:   [place], [address] [place] [address] Phone:  [phone] Fax: [phone]     Social Determinants of Health (SDOH) Interventions    Readmission Risk Interventions     No data to display
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
## 2022-01-13 NOTE — Progress Notes (Signed)
OT Cancellation Note  Patient Details Name: Jonathan Mcgrath MRN: [id number] DOB: Jun 02, 1941   Cancelled Treatment:    Reason Eval/Treat Not Completed: Patient at procedure or test/ unavailable. Order received and chart reviewed. Attempted x2 this date, pt working with PT then at HD. Will continue to follow and initiate services as available.   Dessie Coma, M.S. OTR/L  01/13/22, 1:25 PM  ascom [phone]

## 2022-01-13 NOTE — Care Management Obs Status (Signed)
[facility] NOTIFICATION   Patient Details  Name: Jonathan Mcgrath MRN: [id number] Date of Birth: 07/15/1941   Medicare Observation Status Notification Given:  Yes    Candie Chroman, LCSW 01/13/2022, 3:55 PM
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
## 2022-01-13 NOTE — NC FL2 (Signed)
[place] LEVEL OF CARE SCREENING TOOL     IDENTIFICATION  Patient Name: Jonathan Mcgrath Birthdate: Oct 19, 1940 Sex: male Admission Date (Current Location): 01/12/2022  [facility] and [place] Number:  Engineering geologist and Address:  [facility], [address] [place], [place] [address]      Provider Number: [id number]  Attending Physician Name and Address:  Fritzi Mandes, MD  Relative Name and Phone Number:       Current Level of Care: Hospital Recommended Level of Care: [place] Prior Approval Number:    Date Approved/Denied:   PASRR Number: [id number]  Discharge Plan: SNF    Current Diagnoses: Patient Active Problem List   Diagnosis Date Noted   Patellar fracture 01/12/2022   Closed fracture of left olecranon process 01/12/2022   Leukocytosis 01/12/2022   Acute respiratory disease due to COVID-19 virus 02/15/2021   Anemia in ESRD (end-stage renal disease) ([place]) 02/15/2021   ESRD (end stage renal disease) ([place]) 02/15/2021   Elevated troponin 02/15/2021   Thrombocytopenia ([place]) 02/15/2021   Abnormality of albumin 08/28/2019   Personal history of colon cancer    Polyp of colon    Infection due to Clostridium septicum ([place]) 08/27/2018   Aortitis ([place]) 07/08/2018   Acute hypoxemic respiratory failure ([place]) 06/16/2018   Colon cancer ([place]) 05/16/2018   Adenomatous polyp of colon 05/03/2018   Malignant neoplasm of ascending colon ([place]) 05/03/2018   Benign neoplasm of ascending colon    Benign neoplasm of transverse colon    Polyp of sigmoid colon    Colon neoplasm    Mass of cecum    Diarrhea    Stomach irritation    Abdominal pain 04/12/2018   Encounter for screening for respiratory tuberculosis 09/10/2017   Anemia in chronic kidney disease 04/30/2017   Coagulation defect, unspecified ([place]) 04/30/2017   Dependence on renal dialysis ([place]) 04/30/2017   CAP (community acquired pneumonia) 08/17/2016   ESRD  on peritoneal dialysis ([place]) 07/13/2014   Essential hypertension 07/13/2014   Type 2 diabetes mellitus with stage 5 chronic kidney disease ([place]) 07/13/2014   Diabetic peripheral neuropathy associated with type 2 diabetes mellitus ([place]) 07/13/2014   Vitreous hemorrhage, right eye ([place]) 03/02/2014   Diabetic retinopathy ([place]) 03/02/2014    Orientation RESPIRATION BLADDER Height & Weight     Self, Time, Situation, Place  Normal Continent Weight: 208 lb 12.4 oz (94.7 kg) Height:  '6\' 1"'$  (185.4 cm)  BEHAVIORAL SYMPTOMS/MOOD NEUROLOGICAL BOWEL NUTRITION STATUS   (None)  (None) Continent Diet (Renal with fluid restriction: 1200 mL.)  AMBULATORY STATUS COMMUNICATION OF NEEDS Skin   Total Care (Non-weight bearing.) Verbally Other (Comment) (Laceration on right anterior toe: No dressing noted.)                       Personal Care Assistance Level of Assistance  Bathing, Feeding, Dressing Bathing Assistance: Limited assistance Feeding assistance: Limited assistance Dressing Assistance: Limited assistance     Functional Limitations Info  Sight, Hearing, Speech Sight Info: Adequate Hearing Info: Adequate Speech Info: Adequate    SPECIAL CARE FACTORS FREQUENCY  PT (By licensed PT), OT (By licensed OT)     PT Frequency: 5 x week OT Frequency: 5 x week            Contractures Contractures Info: Not present    Additional Factors Info  Code Status, Allergies Code Status Info: Full code Allergies Info: NKDA  Current Medications (01/13/2022):  This is the current hospital active medication list Current Facility-Administered Medications  Medication Dose Route Frequency Provider Last Rate Last Admin   acetaminophen (TYLENOL) tablet 650 mg  650 mg Oral Q6H PRN Cox, Amy N, DO   650 mg at 01/13/22 5284   Or   acetaminophen (TYLENOL) suppository 650 mg  650 mg Rectal Q6H PRN Cox, Amy N, DO       alteplase (CATHFLO ACTIVASE) injection 2 mg  2 mg Intracatheter Once PRN  Colon Flattery, NP       amLODipine (NORVASC) tablet 5 mg  5 mg Oral Daily Fritzi Mandes, MD       Chlorhexidine Gluconate Cloth 2 % PADS 6 each  6 each Topical Q0600 Colon Flattery, NP   6 each at 01/13/22 0948   famotidine (PEPCID) tablet 20 mg  20 mg Oral Q M,W,F-1800 Cox, Amy N, DO       ferric citrate (AURYXIA) tablet 210 mg  210 mg Oral TID WC Cox, Amy N, DO   210 mg at 01/13/22 0800   gabapentin (NEURONTIN) capsule 100 mg  100 mg Oral QHS Fritzi Mandes, MD       heparin injection 1,000 Units  1,000 Units Dialysis PRN Colon Flattery, NP       heparin injection 5,000 Units  5,000 Units Subcutaneous Q8H Cox, Amy N, DO   5,000 Units at 01/13/22 0556   hydrALAZINE (APRESOLINE) tablet 10 mg  10 mg Oral Q6H PRN Cox, Amy N, DO       insulin aspart (novoLOG) injection 0-5 Units  0-5 Units Subcutaneous QHS Cox, Amy N, DO       insulin aspart (novoLOG) injection 0-6 Units  0-6 Units Subcutaneous TID WC Cox, Amy N, DO       lidocaine (PF) (XYLOCAINE) 1 % injection 5 mL  5 mL Intradermal PRN Breeze, Shantelle, NP       lidocaine-prilocaine (EMLA) cream 1 Application  1 Application Topical PRN Breeze, Shantelle, NP       morphine (PF) 2 MG/ML injection 1 mg  1 mg Intravenous Q4H PRN Cox, Amy N, DO       multivitamin (RENA-VIT) tablet 1 tablet  1 tablet Oral Daily Fritzi Mandes, MD       ondansetron [facility]) tablet 4 mg  4 mg Oral Q6H PRN Cox, Amy N, DO   4 mg at 01/13/22 0555   Or   ondansetron (ZOFRAN) injection 4 mg  4 mg Intravenous Q6H PRN Cox, Amy N, DO       oxyCODONE (Oxy IR/ROXICODONE) immediate release tablet 5 mg  5 mg Oral Q6H PRN Sharion Settler, NP   5 mg at 01/13/22 0555   pentafluoroprop-tetrafluoroeth (GEBAUERS) aerosol 1 Application  1 Application Topical PRN Colon Flattery, NP       senna-docusate (Senokot-S) tablet 1 tablet  1 tablet Oral QHS PRN Cox, Amy N, DO         Discharge Medications: Please see discharge summary for a list of discharge medications.  Relevant  Imaging Results:  Relevant Lab Results:   Additional Information SS#: [id number]. HD MWF [facility] 6:00 am.  Candie Chroman, LCSW

## 2022-01-14 DIAGNOSIS — S52022A Displaced fracture of olecranon process without intraarticular extension of left ulna, initial encounter for closed fracture: Secondary | ICD-10-CM | POA: Diagnosis not present

## 2022-01-14 DIAGNOSIS — N186 End stage renal disease: Secondary | ICD-10-CM | POA: Diagnosis not present

## 2022-01-14 DIAGNOSIS — S82001A Unspecified fracture of right patella, initial encounter for closed fracture: Secondary | ICD-10-CM | POA: Diagnosis not present

## 2022-01-14 LAB — GLUCOSE, CAPILLARY
Glucose-Capillary: 77 mg/dL (ref 70–99)
Glucose-Capillary: 142 mg/dL — ABNORMAL HIGH (ref 70–99)
Glucose-Capillary: 118 mg/dL — ABNORMAL HIGH (ref 70–99)
Glucose-Capillary: 102 mg/dL — ABNORMAL HIGH (ref 70–99)

## 2022-01-14 LAB — HEMOGLOBIN A1C
Hgb A1c MFr Bld: 5.5 % (ref 4.8–5.6)
Mean Plasma Glucose: 111 mg/dL

## 2022-01-14 LAB — HEPATITIS B SURFACE ANTIBODY, QUANTITATIVE: Hep B S AB Quant (Post): 104.7 m[IU]/mL (ref >9.9)
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
## 2022-01-14 NOTE — TOC Progression Note (Addendum)
Transition of Care [facility]) - Progression Note    Patient Details  Name: Jonathan Mcgrath MRN: [id number] Date of Birth: 1940/09/20  Transition of Care [facility]) CM/SW Contact  Izola Price, RN Phone Number: 01/14/2022, 12:31 PM  Clinical Narrative: 7/1: Follow up from 6/30 CSW CM note awaiting cost of transport to HD center. CSW contacted RN CM update that it would be $150 per day or family can transport to HD center and back. Will attempt to update son. Simmie Davies RN CM     Update: Was able to contact son and give this information. Son indicated that was steep for family to pay and that no one is available on consistent basis to transport. Son will also be out of town next week. Need to reach back to HD coordinator for options of changing HD venue? Simmie Davies RN CM.   Expected Discharge Plan: Skilled Nursing Facility Barriers to Discharge: Ship broker, SNF Pending bed offer  Expected Discharge Plan and Services Expected Discharge Plan: [place] Choice: [place] arrangements for the past 2 months: Single Family Home                                       Social Determinants of Health (SDOH) Interventions    Readmission Risk Interventions     No data to display

## 2022-01-15 DIAGNOSIS — N186 End stage renal disease: Secondary | ICD-10-CM | POA: Diagnosis not present

## 2022-01-15 DIAGNOSIS — S82001A Unspecified fracture of right patella, initial encounter for closed fracture: Secondary | ICD-10-CM | POA: Diagnosis not present

## 2022-01-15 DIAGNOSIS — S52022A Displaced fracture of olecranon process without intraarticular extension of left ulna, initial encounter for closed fracture: Secondary | ICD-10-CM | POA: Diagnosis not present

## 2022-01-15 LAB — GLUCOSE, CAPILLARY
Glucose-Capillary: 91 mg/dL (ref 70–99)
Glucose-Capillary: 104 mg/dL — ABNORMAL HIGH (ref 70–99)
Glucose-Capillary: 125 mg/dL — ABNORMAL HIGH (ref 70–99)
Glucose-Capillary: 136 mg/dL — ABNORMAL HIGH (ref 70–99)

## 2022-01-15 NOTE — Plan of Care (Signed)

## 2022-01-15 NOTE — TOC Progression Note (Addendum)
Transition of Care [facility]) - Progression Note    Patient Details  Name: AYOMIKUN STARLING MRN: [id number] Date of Birth: [birth year]  Transition of Care [facility]) CM/SW [place], [place] Phone Number: 01/15/2022, 11:35 AM  Clinical Narrative:     CSW confirmd with Lavella Lemons at [facility] that HD transport for the company they use is $150 per day.   CSW spoke with patient's daughter [name] who reports when patient was at [facility] he was using [facility] for free and was curious if he could continue to use that at [facility], [place] has asked Lavella Lemons at [facility] she reports she can follow up tomorrow on this.    [name] also expressed that since patient has been to [facility] several times that is preferred facility and she does not understand why they could not take patient. CSW explained lack of bed offer due to insurance concerns regarding liability, per notes on 6/30. She reports she wishes to call [facility] tomorrow 7/3 to clarify herself.   Patient's insurance is pending auth for SNF, they have requested updated clinicals which have been sent in [employer] portal.    Expected Discharge Plan: Skilled Nursing Facility Barriers to Discharge: Ship broker, SNF Pending bed offer  Expected Discharge Plan and Services Expected Discharge Plan: [place] Choice: [place] arrangements for the past 2 months: Single Family Home                                       Social Determinants of Health (SDOH) Interventions    Readmission Risk Interventions     No data to display
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
## 2022-01-15 NOTE — Plan of Care (Signed)
Problem: Education: Goal: Knowledge of General Education information will improve Description: Including pain rating scale, medication(s)/side effects and non-pharmacologic comfort measures 01/15/2022 0404 by Rogelia Rohrer, LPN Outcome: Progressing 01/15/2022 0402 by Rogelia Rohrer, LPN Outcome: Progressing   Problem: Health Behavior/Discharge Planning: Goal: Ability to manage health-related needs will improve 01/15/2022 0404 by Rogelia Rohrer, LPN Outcome: Progressing 01/15/2022 0402 by Rogelia Rohrer, LPN Outcome: Progressing   Problem: Clinical Measurements: Goal: Ability to maintain clinical measurements within normal limits will improve 01/15/2022 0404 by Rogelia Rohrer, LPN Outcome: Progressing 01/15/2022 0402 by Rogelia Rohrer, LPN Outcome: Progressing Goal: Will remain free from infection 01/15/2022 0404 by Rogelia Rohrer, LPN Outcome: Progressing 01/15/2022 0402 by Rogelia Rohrer, LPN Outcome: Progressing Goal: Diagnostic test results will improve 01/15/2022 0404 by Rogelia Rohrer, LPN Outcome: Progressing 01/15/2022 0402 by Rogelia Rohrer, LPN Outcome: Progressing Goal: Respiratory complications will improve 01/15/2022 0404 by Rogelia Rohrer, LPN Outcome: Progressing 01/15/2022 0402 by Rogelia Rohrer, LPN Outcome: Progressing Goal: Cardiovascular complication will be avoided 01/15/2022 0404 by Rogelia Rohrer, LPN Outcome: Progressing 01/15/2022 0402 by Rogelia Rohrer, LPN Outcome: Progressing   Problem: Activity: Goal: Risk for activity intolerance will decrease 01/15/2022 0404 by Rogelia Rohrer, LPN Outcome: Progressing 01/15/2022 0402 by Rogelia Rohrer, LPN Outcome: Progressing   Problem: Nutrition: Goal: Adequate nutrition will be maintained 01/15/2022 0404 by Rogelia Rohrer, LPN Outcome: Progressing 01/15/2022 0402 by Rogelia Rohrer, LPN Outcome: Progressing   Problem: Coping: Goal: Level of anxiety will decrease 01/15/2022  0404 by Rogelia Rohrer, LPN Outcome: Progressing 01/15/2022 0402 by Rogelia Rohrer, LPN Outcome: Progressing   Problem: Elimination: Goal: Will not experience complications related to bowel motility 01/15/2022 0404 by Rogelia Rohrer, LPN Outcome: Progressing 01/15/2022 0402 by Rogelia Rohrer, LPN Outcome: Progressing Goal: Will not experience complications related to urinary retention 01/15/2022 0404 by Rogelia Rohrer, LPN Outcome: Progressing 01/15/2022 0402 by Rogelia Rohrer, LPN Outcome: Progressing   Problem: Pain Managment: Goal: General experience of comfort will improve 01/15/2022 0404 by Rogelia Rohrer, LPN Outcome: Progressing 01/15/2022 0402 by Rogelia Rohrer, LPN Outcome: Progressing   Problem: Safety: Goal: Ability to remain free from injury will improve 01/15/2022 0404 by Rogelia Rohrer, LPN Outcome: Progressing 01/15/2022 0402 by Rogelia Rohrer, LPN Outcome: Progressing   Problem: Skin Integrity: Goal: Risk for impaired skin integrity will decrease 01/15/2022 0404 by Rogelia Rohrer, LPN Outcome: Progressing 01/15/2022 0402 by Rogelia Rohrer, LPN Outcome: Progressing   Problem: Education: Goal: Ability to describe self-care measures that may prevent or decrease complications (Diabetes Survival Skills Education) will improve 01/15/2022 0404 by Rogelia Rohrer, LPN Outcome: Progressing 01/15/2022 0402 by Rogelia Rohrer, LPN Outcome: Progressing Goal: Individualized Educational Video(s) 01/15/2022 0404 by Rogelia Rohrer, LPN Outcome: Progressing 01/15/2022 0402 by Rogelia Rohrer, LPN Outcome: Progressing   Problem: Coping: Goal: Ability to adjust to condition or change in health will improve 01/15/2022 0404 by Rogelia Rohrer, LPN Outcome: Progressing 01/15/2022 0402 by Rogelia Rohrer, LPN Outcome: Progressing   Problem: Fluid Volume: Goal: Ability to maintain a balanced intake and output will improve 01/15/2022 0404 by Rogelia Rohrer, LPN Outcome: Progressing 01/15/2022 0402 by Rogelia Rohrer, LPN Outcome: Progressing   Problem: Health Behavior/Discharge Planning: Goal: Ability to identify and utilize available resources and services will improve 01/15/2022 0404 by Rogelia Rohrer, LPN Outcome: Progressing 01/15/2022 0402 by Rogelia Rohrer,  LPN Outcome: Progressing Goal: Ability to manage health-related needs will improve 01/15/2022 0404 by Rogelia Rohrer, LPN Outcome: Progressing 01/15/2022 0402 by Rogelia Rohrer, LPN Outcome: Progressing   Problem: Metabolic: Goal: Ability to maintain appropriate glucose levels will improve 01/15/2022 0404 by Rogelia Rohrer, LPN Outcome: Progressing 01/15/2022 0402 by Rogelia Rohrer, LPN Outcome: Progressing   Problem: Nutritional: Goal: Maintenance of adequate nutrition will improve 01/15/2022 0404 by Rogelia Rohrer, LPN Outcome: Progressing 01/15/2022 0402 by Rogelia Rohrer, LPN Outcome: Progressing Goal: Progress toward achieving an optimal weight will improve 01/15/2022 0404 by Rogelia Rohrer, LPN Outcome: Progressing 01/15/2022 0402 by Rogelia Rohrer, LPN Outcome: Progressing   Problem: Skin Integrity: Goal: Risk for impaired skin integrity will decrease 01/15/2022 0404 by Rogelia Rohrer, LPN Outcome: Progressing 01/15/2022 0402 by Rogelia Rohrer, LPN Outcome: Progressing   Problem: Tissue Perfusion: Goal: Adequacy of tissue perfusion will improve 01/15/2022 0404 by Rogelia Rohrer, LPN Outcome: Progressing 01/15/2022 0402 by Rogelia Rohrer, LPN Outcome: Progressing
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
## 2022-01-16 ENCOUNTER — Encounter: Payer: Self-pay | Admitting: Surgery

## 2022-01-16 DIAGNOSIS — S82141A Displaced bicondylar fracture of right tibia, initial encounter for closed fracture: Secondary | ICD-10-CM | POA: Insufficient documentation

## 2022-01-16 DIAGNOSIS — S82001A Unspecified fracture of right patella, initial encounter for closed fracture: Secondary | ICD-10-CM | POA: Diagnosis not present

## 2022-01-16 DIAGNOSIS — S92324A Nondisplaced fracture of second metatarsal bone, right foot, initial encounter for closed fracture: Secondary | ICD-10-CM | POA: Insufficient documentation

## 2022-01-16 LAB — RENAL FUNCTION PANEL
Albumin: 3.4 g/dL — ABNORMAL LOW (ref 3.5–5.0)
Anion gap: 12 (ref 5–15)
BUN: 66 mg/dL — ABNORMAL HIGH (ref 8–23)
CO2: 30 mmol/L (ref 22–32)
Calcium: 8.5 mg/dL — ABNORMAL LOW (ref 8.9–10.3)
Chloride: 95 mmol/L — ABNORMAL LOW (ref 98–111)
Creatinine, Ser: 11.93 mg/dL — ABNORMAL HIGH (ref 0.61–1.24)
GFR, Estimated: 4 mL/min — ABNORMAL LOW (ref >60)
Glucose, Bld: 99 mg/dL (ref 70–99)
Phosphorus: 7.8 mg/dL — ABNORMAL HIGH (ref 2.5–4.6)
Potassium: 4.8 mmol/L (ref 3.5–5.1)
Sodium: 137 mmol/L (ref 135–145)

## 2022-01-16 LAB — CBC
HCT: 33.7 % — ABNORMAL LOW (ref 39.0–52.0)
Hemoglobin: 11 g/dL — ABNORMAL LOW (ref 13.0–17.0)
MCH: 30.6 pg (ref 26.0–34.0)
MCHC: 32.6 g/dL (ref 30.0–36.0)
MCV: 93.9 fL (ref 80.0–100.0)
Platelets: 109 10*3/uL — ABNORMAL LOW (ref 150–400)
RBC: 3.59 MIL/uL — ABNORMAL LOW (ref 4.22–5.81)
RDW: 13.8 % (ref 11.5–15.5)
WBC: 14.9 10*3/uL — ABNORMAL HIGH (ref 4.0–10.5)
nRBC: 0 % (ref 0.0–0.2)

## 2022-01-16 LAB — GLUCOSE, CAPILLARY: Glucose-Capillary: 87 mg/dL (ref 70–99)

## 2022-01-16 MED ORDER — AMLODIPINE BESYLATE 5 MG PO TABS: 5.0000 mg | ORAL_TABLET | Freq: Every day | ORAL | 0 refills | Status: DC

## 2022-01-16 MED ORDER — POLYETHYLENE GLYCOL 3350 17 G PO PACK
17.0000 g | PACK | Freq: Every day | ORAL | Status: DC | PRN
Start: 2022-01-16 — End: 2022-01-16

## 2022-01-16 MED ORDER — RENA-VITE PO TABS
1.0000 | ORAL_TABLET | Freq: Every day | ORAL | Status: DC
Start: 2022-01-16 — End: 2022-01-16
  Filled 2022-01-16: qty 1

## 2022-01-16 MED ORDER — GABAPENTIN 300 MG PO CAPS
300.0000 mg | ORAL_CAPSULE | Freq: Every day | ORAL | Status: DC
  Administered 2022-01-16: 300 mg via ORAL
  Filled 2022-01-16: qty 1

## 2022-01-16 MED ORDER — SENNOSIDES-DOCUSATE SODIUM 8.6-50 MG PO TABS: 1.0000 | ORAL_TABLET | Freq: Two times a day (BID) | ORAL | 0 refills | Status: DC

## 2022-01-16 MED ORDER — OXYCODONE HCL 5 MG PO TABS: 5.0000 mg | ORAL_TABLET | Freq: Four times a day (QID) | ORAL | 0 refills | Status: DC | PRN

## 2022-01-16 MED ORDER — BISACODYL 10 MG RE SUPP
10.0000 mg | Freq: Every day | RECTAL | Status: DC
  Filled 2022-01-16: qty 1

## 2022-01-16 MED ORDER — VITAMIN D2 10 MCG (400 UNIT) PO TABS: ORAL_TABLET | ORAL | 0 refills | Status: DC

## 2022-01-16 MED ORDER — RENA-VITE PO TABS: 1.0000 | ORAL_TABLET | Freq: Every day | ORAL | 11 refills | Status: DC

## 2022-01-16 MED ORDER — FLEET ENEMA 7-19 GM/118ML RE ENEM
1.0000 | ENEMA | Freq: Once | RECTAL | Status: AC
  Administered 2022-01-16: 1 via RECTAL

## 2022-01-16 MED ORDER — SENNOSIDES-DOCUSATE SODIUM 8.6-50 MG PO TABS
1.0000 | ORAL_TABLET | Freq: Two times a day (BID) | ORAL | Status: DC
  Filled 2022-01-16: qty 1

## 2022-01-16 NOTE — Discharge Instructions (Addendum)
Resume your hemodialysis Monday Wednesday Friday as outpatient as before.  Per Dr Roland Rack:  He should be placed in a knee immobilizer and made nonweightbearing for the next 2 to 3 weeks.  He may then begin toe-touch to partial weightbearing as symptoms permit.  He may remove the knee immobilizer for bathing purposes and for knee range of motion exercises.

## 2022-01-16 NOTE — TOC Progression Note (Addendum)
Transition of Care [facility]) - Progression Note    Patient Details  Name: BYRANT VALENT MRN: [id number] Date of Birth: Dec 24, 1940  Transition of Care [facility]) CM/SW Contact  Candie Chroman, LCSW Phone Number: 01/16/2022, 9:08 AM  Clinical Narrative:   [place] is checking to see if [facility] can transport patient to HD from their facility.  10:45 am: [facility] unable to use [facility] because they do not have a contract with them and it would take 30 days to get one. TOC coworker received call from daughter requesting [facility] so she sent referral on the hub.  11:28 am: [facility] admissions coordinator called and said that patient's [facility] will pay for transportation to HD. Patient and daughter are aware and agreeable. Assigned facility to pending authorization.  11:55 am: Auth approved: [id number]. Valid 7/3-7/6. MD is aware. Left message for SNF admissions coordinator to notify.  Expected Discharge Plan: Skilled Nursing Facility Barriers to Discharge: Ship broker, SNF Pending bed offer  Expected Discharge Plan and Services Expected Discharge Plan: [place] Choice: [place] arrangements for the past 2 months: Single Family Home                                       Social Determinants of Health (SDOH) Interventions    Readmission Risk Interventions     No data to display
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
## 2022-01-16 NOTE — TOC Transition Note (Signed)
Transition of Care [facility]) - CM/SW Discharge Note   Patient Details  Name: PROSPERO MAHNKE MRN: [id number] Date of Birth: [birth year]  [facility]) CM/SW Contact:  Candie Chroman, LCSW Phone Number: 01/16/2022, 2:15 PM   Clinical Narrative:   Patient has orders to discharge home today. RN is calling report now. EMS transport has been set up for 3:00. No further concerns. CSW signing off.  Final next level of care: Skilled Nursing Facility Barriers to Discharge: Barriers Resolved   Patient Goals and CMS Choice     Choice offered to / list presented to : Patient, Adult Children  Discharge Placement   Existing PASRR number confirmed : 01/13/22          Patient [facility] bed [facility] Patient to be transferred to facility by: EMS Name of family member notified: Ileene Hutchinson Patient and family notified of of transfer: 01/16/22  Discharge Plan and Services     Post Acute Care Choice: [place]                               Social Determinants of Health (SDOH) Interventions     Readmission Risk Interventions     No data to display

## 2022-01-16 NOTE — Progress Notes (Signed)
PT Cancellation Note  Patient Details Name: Jonathan Mcgrath MRN: [id number] DOB: [birth year]   Cancelled Treatment:    Reason Eval/Treat Not Completed: Patient at procedure or test/unavailable: Pt out of room at HD, will attempt to see pt at a future date/time as medically appropriate.     Linus Salmons PT, DPT 01/16/22, 12:55 PM
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
## 2022-01-16 NOTE — Plan of Care (Signed)

## 2022-01-16 NOTE — Progress Notes (Addendum)
Report given to [place] at [facility]. DC instructions also provided to patient. IV out. Patient left via ambulance at 1645.

## 2022-02-24 DIAGNOSIS — S82892A Other fracture of left lower leg, initial encounter for closed fracture: Secondary | ICD-10-CM | POA: Insufficient documentation

## 2022-05-18 ENCOUNTER — Ambulatory Visit: Payer: Medicare HMO | Admitting: Infectious Diseases

## 2022-05-18 ENCOUNTER — Ambulatory Visit: Payer: Medicare HMO | Attending: Infectious Diseases | Admitting: Infectious Diseases

## 2022-05-18 DIAGNOSIS — C9111 Chronic lymphocytic leukemia of B-cell type in remission: Secondary | ICD-10-CM | POA: Insufficient documentation

## 2022-05-18 DIAGNOSIS — D72829 Elevated white blood cell count, unspecified: Secondary | ICD-10-CM | POA: Diagnosis not present

## 2022-05-18 DIAGNOSIS — C180 Malignant neoplasm of cecum: Secondary | ICD-10-CM | POA: Diagnosis not present

## 2022-05-18 DIAGNOSIS — Z8619 Personal history of other infectious and parasitic diseases: Secondary | ICD-10-CM | POA: Insufficient documentation

## 2022-05-18 DIAGNOSIS — N186 End stage renal disease: Secondary | ICD-10-CM | POA: Insufficient documentation

## 2022-05-18 DIAGNOSIS — Z792 Long term (current) use of antibiotics: Secondary | ICD-10-CM | POA: Insufficient documentation

## 2022-05-18 DIAGNOSIS — D649 Anemia, unspecified: Secondary | ICD-10-CM | POA: Insufficient documentation

## 2022-05-18 DIAGNOSIS — I776 Arteritis, unspecified: Secondary | ICD-10-CM

## 2022-05-18 DIAGNOSIS — I120 Hypertensive chronic kidney disease with stage 5 chronic kidney disease or end stage renal disease: Secondary | ICD-10-CM | POA: Diagnosis not present

## 2022-05-18 DIAGNOSIS — A480 Gas gangrene: Secondary | ICD-10-CM | POA: Diagnosis not present

## 2022-05-18 DIAGNOSIS — Z9049 Acquired absence of other specified parts of digestive tract: Secondary | ICD-10-CM | POA: Insufficient documentation

## 2022-05-18 DIAGNOSIS — E1122 Type 2 diabetes mellitus with diabetic chronic kidney disease: Secondary | ICD-10-CM | POA: Diagnosis not present

## 2022-05-18 DIAGNOSIS — R7881 Bacteremia: Secondary | ICD-10-CM | POA: Diagnosis not present

## 2022-05-18 NOTE — Progress Notes (Signed)
The purpose of this virtual visit is to provide medical care while limiting exposure to the novel coronavirus (COVID19) for both patient and office staff.   Consent was obtained for phone visit:  Yes.   Answered questions that patient had about telehealth interaction:  Yes.   I discussed the limitations, risks, security and privacy concerns of performing an evaluation and management service by telephone. I also discussed with the patient that there may be a patient responsible charge related to this service. The patient expressed understanding and agreed to proceed.   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 He also was diagnosed with ca colon ( the clostridium bacteremia was the first event that triggered a colonoscopy). He underwent rt hemicolectomy   05/16/2018 . He had a complicated aortitis causing pseudoaneursyms and had to get stents in aorta, common iliac, rt and left iliac and external iliac on 07/15/2018.  He received IV unasyn for 6 weeks  And has been on po amox/clav since then which he is going to take indefinitely  Since his last visit with me he was admitted to the hospital iin July 2023 for a fall and fracture rt patella and left olecranon process  He is doing okay  No pain abdomen, no diarrhea No weight loss-  Lives on his own-drives  Gets dialysis 100% adherent to amox/clav He was intially confused about his whole illness and why he was taking antibiotic and I had to jot his memory about the hospital stay in 2019. He is now fully aware  .  Past Medical History:  Diagnosis Date   Cancer ([place]) 2019   colon   Chronic kidney disease    Diabetes mellitus without complication ([place])    diet controlled    Hemodialysis access site with arteriovenous graft ([place])    Hypertension    Infection due to Clostridium septicum ([place]) 08/27/2018   Renal insufficiency    Pt on dialysis x 3 years and receives every Monday, Wednesday and Friday.   CLL   Past Surgical History:  Procedure Laterality Date   AV FISTULA PLACEMENT Left 2015   arm   CATARACT EXTRACTION W/ INTRAOCULAR LENS  IMPLANT, BILATERAL Bilateral 08/2017   CENTRAL LINE INSERTION-TUNNELED N/A 04/19/2018   Procedure: CENTRAL LINE INSERTION-TUNNELED;  Surgeon: Katha Cabal, MD;  Location: [facility];  Service: Cardiovascular;  Laterality: N/A;   CENTRAL VENOUS CATHETER INSERTION  07/15/2018   Procedure: Insertion Hickman Catheter;  Surgeon: Algernon Huxley, MD;  Location: [facility];  Service: Cardiovascular;;   COLON RESECTION N/A 05/16/2018   Procedure: LAPAROSCOPIC  COLON RESECTION;  Surgeon: Herbert Pun, MD;  Location: [facility];  Service: General;  Laterality: N/A;   COLONOSCOPY N/A 04/18/2018   Procedure: COLONOSCOPY;  Surgeon: Virgel Manifold, MD;  Location: [facility];  Service: Endoscopy;  Laterality: N/A;   COLONOSCOPY WITH PROPOFOL N/A 05/22/2019   Procedure: COLONOSCOPY WITH PROPOFOL;  Surgeon: Virgel Manifold, MD;  Location: [facility];  Service: Endoscopy;  Laterality: N/A;   DIALYSIS/PERMA CATHETER REMOVAL N/A 06/19/2018   Procedure: DIALYSIS/PERMA CATHETER REMOVAL;  Surgeon: Algernon Huxley, MD;  Location: [facility];  Service: Cardiovascular;  Laterality: N/A;   DIALYSIS/PERMA CATHETER REMOVAL N/A  09/24/2018   Procedure: DIALYSIS/PERMA CATHETER REMOVAL;  Surgeon: Katha Cabal, MD;  Location: [facility];  Service: Cardiovascular;  Laterality: N/A;   ESOPHAGOGASTRODUODENOSCOPY N/A 04/18/2018   Procedure: ESOPHAGOGASTRODUODENOSCOPY (EGD);  Surgeon: Virgel Manifold, MD;  Location: [facility];  Service: Endoscopy;  Laterality: N/A;   EYE SURGERY     PELVIC  ANGIOGRAPHY  07/15/2018   Procedure: PELVIC ANGIOGRAPHY;  Surgeon: Katha Cabal, MD;  Location: [facility];  Service: Cardiovascular;;   PERIPHERAL VASCULAR BALLOON ANGIOPLASTY Bilateral 07/15/2018   Procedure: PERIPHERAL VASCULAR BALLOON ANGIOPLASTY;  Surgeon: Algernon Huxley, MD;  Location: [facility];  Service: Cardiovascular;  Laterality: Bilateral;   PERIPHERAL VASCULAR CATHETERIZATION Left 01/12/2016   Procedure: A/V Shuntogram/Fistulagram;  Surgeon: Algernon Huxley, MD;  Location: [facility];  Service: Cardiovascular;  Laterality: Left;   PERIPHERAL VASCULAR CATHETERIZATION N/A 01/12/2016   Procedure: A/V Shunt Intervention;  Surgeon: Algernon Huxley, MD;  Location: [facility];  Service: Cardiovascular;  Laterality: N/A;   PERITONEAL CATHETER INSERTION N/A    REMOVAL OF A DIALYSIS CATHETER N/A 01/14/2015   Procedure: REMOVAL OF A  PERITONEAL DIALYSIS CATHETER;  Surgeon: Algernon Huxley, MD;  Location: [facility];  Service: Vascular;  Laterality: N/A;   TEE WITHOUT CARDIOVERSION N/A 04/19/2018   Procedure: TRANSESOPHAGEAL ECHOCARDIOGRAM (TEE);  Surgeon: Teodoro Spray, MD;  Location: [facility];  Service: Cardiovascular;  Laterality: N/A;    Social History   Socioeconomic History   Marital status: Widowed    Spouse name: Not on file   Number of children: 4   Years of education: Not on file   Highest education level: Not on file  Occupational History   Occupation: retired  Tobacco Use   Smoking status: Never   Smokeless tobacco: Never  Vaping Use   Vaping Use: Never used  Substance and Sexual Activity   Alcohol use: No   Drug use: No   Sexual activity: Not Currently  Other Topics Concern   Not on file  Social History Narrative   Not on file   Social Determinants of Health   Financial Resource Strain: Not on file  Food Insecurity: Not on file  Transportation Needs: Not on file  Physical Activity: Not on file  Stress: Not on file  Social  Connections: Not on file  Intimate Partner Violence: Not on file    Family History  Problem Relation Age of Onset   Diabetes Mother    Heart disease Father    No Known Allergies  ? Current Outpatient Medications  Medication Sig Dispense Refill   amLODipine (NORVASC) 5 MG tablet Take 1 tablet (5 mg total) by mouth daily. 30 tablet 0   B Complex-C-Folic Acid (RENA-VITE RX) 1 MG TABS Take 1 tablet by mouth daily.     Ergocalciferol (VITAMIN D2) 10 MCG (400 UNIT) TABS Take 1 tab daily 30 tablet 0   famotidine (PEPCID) 20 MG tablet Take 20 mg by mouth 2 (two) times daily. 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     ferric citrate (AURYXIA) 1 GM 210 MG(Fe) tablet Take 210 mg by mouth 3 (three) times daily with meals.     gabapentin (NEURONTIN) 100 MG capsule Take 100 mg by mouth at bedtime.     gabapentin (NEURONTIN) 300 MG capsule Take 300 mg by mouth daily.     multivitamin (RENA-VIT) TABS tablet Take 1 tablet by mouth daily. 30 tablet 11   ondansetron (ZOFRAN) 4  MG tablet Take 4 mg by mouth 3 (three) times daily as needed for nausea/vomiting.     oxyCODONE (OXY IR/ROXICODONE) 5 MG immediate release tablet Take 1 tablet (5 mg total) by mouth every 6 (six) hours as needed for moderate pain. 10 tablet 0   senna-docusate (SENOKOT-S) 8.6-50 MG tablet Take 1 tablet by mouth 2 (two) times daily. 30 tablet 0   No current facility-administered medications for this visit.     Abtx:  Anti-infectives (From admission, onward)    None       REVIEW OF SYSTEMS:  Const: negative fever, negative chills, negative weight loss Eyes: negative diplopia or visual changes, negative eye pain ENT: negative coryza, negative sore throat, hard of hearing Resp: negative cough, hemoptysis, dyspnea Cards: negative for chest pain, palpitations, lower extremity edema GU: negative for frequency, dysuria and hematuria GI: no pain abdomen, no diarrhea, no nausea Skin: negative for rash and pruritus Heme: negative for  easy bruising and gum/nose bleeding OV:FIEP weakness, stiffness joints Neurolo:numbness feet  Psych: negative for feelings of anxiety, depression  Endocrine:as above Allergy/Immunology- negative for any medication or food allergies ?  Objective:  No clincial examination  ? 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  secondary to adenocarcinoma of the caecum Pt on indefinite PO augmentin '500mg'$  once a day after a prolonged course of IV unasyn  in 2019 and early 2020 No recent labs     s/p rt hemicolectomy and primary anastomosis 05/16/18 for ca cecum Last  colonoscopy 05/22/2019 -a few polyps but no malignancy or recurrence at the anastomosis site- He has not seen GI/surgery after that Says he is too old to go for all these appts  Leucocytosis since 2013- absolute lymphocytosis- has a diagnosis of CLL (cannot see those notes in Epic as it was in 2014)  follow up with heme/onc  Thrombocytopenia since 2013 likely due to the above Fluctuates- when he had the aortitis it had normalized due to the infection  Anemia -     Continue augmentin '500mg'$  once a day indefinitely  ? ___________________________________________________ Discussed with patient Follow up  6 months  Total time spent on this phone visit 12 min

## 2022-07-09 ENCOUNTER — Emergency Department
Admission: EM | Admit: 2022-07-09 | Discharge: 2022-07-09 | Disposition: A | Discharge status: Home / Self Care | Payer: Medicare HMO | Source: Home / Self Care | Attending: Emergency Medicine | Admitting: Emergency Medicine

## 2022-07-09 ENCOUNTER — Emergency Department: Payer: Medicare HMO

## 2022-07-09 ENCOUNTER — Other Ambulatory Visit: Payer: Self-pay

## 2022-07-09 DIAGNOSIS — Z85038 Personal history of other malignant neoplasm of large intestine: Secondary | ICD-10-CM | POA: Insufficient documentation

## 2022-07-09 DIAGNOSIS — Z992 Dependence on renal dialysis: Secondary | ICD-10-CM | POA: Insufficient documentation

## 2022-07-09 DIAGNOSIS — R7989 Other specified abnormal findings of blood chemistry: Secondary | ICD-10-CM | POA: Insufficient documentation

## 2022-07-09 DIAGNOSIS — E1122 Type 2 diabetes mellitus with diabetic chronic kidney disease: Secondary | ICD-10-CM | POA: Insufficient documentation

## 2022-07-09 DIAGNOSIS — R4781 Slurred speech: Secondary | ICD-10-CM | POA: Insufficient documentation

## 2022-07-09 DIAGNOSIS — I120 Hypertensive chronic kidney disease with stage 5 chronic kidney disease or end stage renal disease: Secondary | ICD-10-CM | POA: Insufficient documentation

## 2022-07-09 DIAGNOSIS — N186 End stage renal disease: Secondary | ICD-10-CM | POA: Insufficient documentation

## 2022-07-09 DIAGNOSIS — I6329 Cerebral infarction due to unspecified occlusion or stenosis of other precerebral arteries: Secondary | ICD-10-CM | POA: Diagnosis not present

## 2022-07-09 LAB — DIFFERENTIAL
Abs Immature Granulocytes: 0.03 10*3/uL (ref 0.00–0.07)
Basophils Absolute: 0 10*3/uL (ref 0.0–0.1)
Basophils Relative: 0 %
Eosinophils Absolute: 0 10*3/uL (ref 0.0–0.5)
Eosinophils Relative: 0 %
Immature Granulocytes: 0 %
Lymphocytes Relative: 82 %
Lymphs Abs: 12.4 10*3/uL — ABNORMAL HIGH (ref 0.7–4.0)
Monocytes Absolute: 0.7 10*3/uL (ref 0.1–1.0)
Monocytes Relative: 4 %
Neutro Abs: 2.1 10*3/uL (ref 1.7–7.7)
Neutrophils Relative %: 14 %
Smear Review: NORMAL

## 2022-07-09 LAB — TROPONIN I (HIGH SENSITIVITY)
Troponin I (High Sensitivity): 43 ng/L — ABNORMAL HIGH (ref <18)
Troponin I (High Sensitivity): 42 ng/L — ABNORMAL HIGH (ref <18)

## 2022-07-09 LAB — CBC
HCT: 40.3 % (ref 39.0–52.0)
Hemoglobin: 13.4 g/dL (ref 13.0–17.0)
MCH: 30.7 pg (ref 26.0–34.0)
MCHC: 33.3 g/dL (ref 30.0–36.0)
MCV: 92.4 fL (ref 80.0–100.0)
Platelets: 102 10*3/uL — ABNORMAL LOW (ref 150–400)
RBC: 4.36 MIL/uL (ref 4.22–5.81)
RDW: 14.2 % (ref 11.5–15.5)
WBC: 15.1 10*3/uL — ABNORMAL HIGH (ref 4.0–10.5)
nRBC: 0.2 % (ref 0.0–0.2)

## 2022-07-09 LAB — COMPREHENSIVE METABOLIC PANEL
ALT: 14 U/L (ref 0–44)
AST: 17 U/L (ref 15–41)
Albumin: 3.8 g/dL (ref 3.5–5.0)
Alkaline Phosphatase: 85 U/L (ref 38–126)
Anion gap: 11 (ref 5–15)
BUN: 20 mg/dL (ref 8–23)
CO2: 29 mmol/L (ref 22–32)
Calcium: 9.1 mg/dL (ref 8.9–10.3)
Chloride: 101 mmol/L (ref 98–111)
Creatinine, Ser: 4.05 mg/dL — ABNORMAL HIGH (ref 0.61–1.24)
GFR, Estimated: 14 mL/min — ABNORMAL LOW (ref >60)
Glucose, Bld: 95 mg/dL (ref 70–99)
Potassium: 3.7 mmol/L (ref 3.5–5.1)
Sodium: 141 mmol/L (ref 135–145)
Total Bilirubin: 1.1 mg/dL (ref 0.3–1.2)
Total Protein: 6.6 g/dL (ref 6.5–8.1)

## 2022-07-09 LAB — CBG MONITORING, ED
Glucose-Capillary: 91 mg/dL (ref 70–99)
Glucose-Capillary: 96 mg/dL (ref 70–99)

## 2022-07-09 LAB — APTT: aPTT: 30 seconds (ref 24–36)

## 2022-07-09 LAB — PROTIME-INR
INR: 1.1 (ref 0.8–1.2)
Prothrombin Time: 13.8 seconds (ref 11.4–15.2)

## 2022-07-09 MED ORDER — SODIUM CHLORIDE 0.9% FLUSH
3.0000 mL | Freq: Once | INTRAVENOUS | Status: AC
  Administered 2022-07-09: 3 mL via INTRAVENOUS

## 2022-07-09 NOTE — ED Triage Notes (Signed)
Pt to er via ems, pt states that he is here for some slurred speech, pt states that he feels generally weak, pt states that he went to dialysis today, states that he got his full run.  Pt denies slurred speech at this time. Pt has no complaints at this time.  Pt states that he had a slight headache earlier.

## 2022-07-09 NOTE — Discharge Instructions (Addendum)
Please call your neurologist to make a follow-up due to the concern for some old strokes seen on your MRI.  Please take a baby aspirin daily '81mg'$  to prevent strokes.  If you have return of symptoms you need to return to the ER immediately as we would want to do a stroke workup.  This includes slurred speech, weakness on one side.  Sudden onset of dizziness nausea, vomiting or any other concerns.  There are some different neurologist listed that you can call to make a follow-up appointment.  I also reviewed your infectious disease note where they wanted you to continue the Augmentin so make sure that you are taking these to prevent infections.   IMPRESSION: 1. No acute intracranial pathology. 2. Multiple small remote lacunar infarcts and background chronic small-vessel ischemic change as above.

## 2022-07-09 NOTE — ED Notes (Addendum)
ED Provider Nickolas Madrid MD at bedside.

## 2022-07-09 NOTE — ED Triage Notes (Signed)
Pt BIB [facility]   LEFT ARM RESTRICTIONS LVO: 0 Slurred speech initially resolved after a snack was given. Pt states experiencing this before when dehydrated. Family is on the way Last Known normal during dialysis, but unknown time.   82 CBG repeat 98  EMS Vitals 182/78 BP then 170s

## 2022-07-09 NOTE — ED Provider Notes (Signed)
[facility] Provider Note    Event Date/Time   First MD Initiated Contact with Patient 07/09/22 1648     (approximate)   History   Aphasia   HPI  Jonathan Mcgrath is a 81 y.o. 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  Patient reports around 9 AM he had an episode of slurred speech.  He reports that he thinks they took off extra fluid from him and that he had missed his alarm this morning and that he had missed having breakfast and they took his glucose and it was in the 80s and he feels like this is a lot lower than normal and so he thinks that it was either from his low glucose or his low blood pressure.  He reports this lasted about 1 to 2 hours during the dialysis session but then it resolved.  He denies any chest pain, shortness of breath, abdominal pain.  Denies any fevers.  I reviewed patient's ID note where patient has leukocytosis since 2013 and he is absolute lymphocytosis and has a diagnosis of CLL.  I reviewed the note that states that patient should be on indefinite p.o. Augmentin 500 once a day.  Patient reports that he has not been taking this and explained to patient that he should be taking it according to ID's note.  I reviewed patient's hospital admission from 02/2021.   Physical Exam   Triage Vital Signs: ED Triage Vitals  Enc Vitals Group     BP 07/09/22 1214 (!) 161/73     Pulse Rate 07/09/22 1214 66     Resp 07/09/22 1214 17     Temp 07/09/22 1214 97.7 F (36.5 C)     Temp Source 07/09/22 1214 Oral     SpO2 07/09/22 1214 98 %     Weight 07/09/22 1216 210 lb (95.3 kg)     Height 07/09/22 1216 '6\' 1"'$  (1.854 m)     Head Circumference --      Peak Flow --      Pain Score 07/09/22 1214 0     Pain Loc --      Pain Edu? --      Excl. in [place]? --     Most recent vital  signs: Vitals:   07/09/22 1214  BP: (!) 161/73  Pulse: 66  Resp: 17  Temp: 97.7 F (36.5 C)  SpO2: 98%     General: Awake, no distress.  CV:  Good peripheral perfusion.  Resp:  Normal effort.  Abd:  No distention.  Other:  NIH stroke scale is 0.  Cranial nerves are intact.  Equal Mcgrath in arms and legs.  Family is at bedside who report that his speech is at baseline.   ED Results / Procedures / Treatments   Labs (all labs ordered are listed, but only abnormal results are displayed) Labs Reviewed  CBC - Abnormal; Notable for the following components:      Result Value   WBC 15.1 (*)    Platelets 102 (*)    All other components within normal limits  DIFFERENTIAL - Abnormal; Notable for the following components:   Lymphs Abs 12.4 (*)    All other components within normal limits  COMPREHENSIVE METABOLIC PANEL - Abnormal; Notable for the following components:   Creatinine, Ser 4.05 (*)    GFR, Estimated 14 (*)  All other components within normal limits  PROTIME-INR  APTT  ETHANOL  PATHOLOGIST SMEAR REVIEW  CBG MONITORING, ED  CBG MONITORING, ED     EKG  My interpretation of EKG:  Normal sinus rate of 64 without any ST elevations, T wave version in lead III, left bundle branch block..  Similar to prior EKGs.  RADIOLOGY I have reviewed the CT head personally and interpreted and patient has no evidence of intercranial hemorrhage  1. No acute intracranial findings. 2. Chronic microvascular ischemic change and cerebral volume loss. 3. Partial right mastoid effusion.   PROCEDURES:  Critical Care performed: No  Procedures   MEDICATIONS ORDERED IN ED: Medications  sodium chloride flush (NS) 0.9 % injection 3 mL (has no administration in time range)     IMPRESSION / MDM / ASSESSMENT AND PLAN / ED COURSE  I reviewed the triage vital signs and the nursing notes.   Patient's presentation is most consistent with acute presentation with potential threat  to life or bodily function.  Differential is TIA, hypoglycemia, hypotension, stroke.  Discussed with patient that if we knew that this was a TIA I would recommend admission for TIA workup but given there is also other possible causes including low blood pressure with his fluid being taken off, low sugar we discussed that MRI was negative going home.  Patient declined admission would prefer if MRI was negative for stroke to go home given he is now at baseline however he understands that if his symptoms are returning he would need to return to the ER for stroke workup.  Patient's son is at bedside who is also agreeable to this plan to try to stay out of the hospital given he is now at baseline.  Coags normal.  CBC shows slightly elevated white count but similar to his prior visits.  Similar lymphocyte elevation.  CMP does show low glucose of 95 but similar to priors.  Creatinine is 4.05 and patient just underwent dialysis no evidence of hyperkalemia.  Discussed with Dr Quinn Axe for neurology.  Patient has many other reasons for the aphasia that could have been TIA such as the low glucose or low blood pressure.  Patient had does have some old strokes that she would recommend starting a baby aspirin on.  Patient does have some low platelets but patient denies any bleeding issues.  He denies any frequent falls and the benefits outweigh risk of starting the baby aspirin.  She recommended outpatient follow-up for neurology to have stroke workups given these old strokes and he understands that if he has return of symptoms or new symptoms of strokes to return to the ER for repeat evaluation.  He understands the importance of coming in soon as possible.  However at this time we considered admission but patient also prefers outpatient follow-up and the strokes on MRI are old in nature and this does not sound as consistent with TIA.  I also had extensive conversation with patient that he is supposed to be on Augmentin.   Looks like he was taking it back in November and he was told to continue taking it so he states that he has these pills at home will continue taking them.  He has no new fever or cysts infectious symptoms and his white count is always elevated from known CLL  He has been here for over 8 hours without any recurrent symptoms.  Patient remains in symptomatic and feels comfortable with discharge home   The patient is on the  cardiac monitor to evaluate for evidence of arrhythmia and/or significant heart rate changes.      FINAL CLINICAL IMPRESSION(S) / ED DIAGNOSES   Final diagnoses:  Slurred speech  ESRD (end stage renal disease) ([place])     Rx / DC Orders   ED Discharge Orders     None        Note:  This document was prepared using Dragon voice recognition software and may include unintentional dictation errors.   Vanessa , MD 07/09/22 2032

## 2022-07-09 NOTE — ED Provider Triage Note (Signed)
Emergency Medicine Provider Triage Evaluation Note  MICHAI DIEPPA, a 81 y.o. male  was evaluated in triage.  Pt complains of transient slurred speech.  With a history of CKD on dialysis, presents to the ED via EMS from the dialysis center.  He awoke this morning at about 7 AM, felt of his normal, and usual health.  He noted that at about 9:00 am while in dialysis, his speech seemed slurred.  The nurse noted the same.  He presents now with symptoms resolved, denies any active weakness, dizziness, confusion, or slurred speech.  Review of Systems  Positive: aphasia Negative: Syncope, weakness  Physical Exam  BP (!) 161/73 (BP Location: Right Arm)   Pulse 66   Temp 97.7 F (36.5 C) (Oral)   Resp 17   Ht '6\' 1"'$  (1.854 m)   Wt 95.3 kg   SpO2 98%   BMI 27.71 kg/m  Gen:   Awake, no distress  NAD Resp:  Normal effort CTA MSK:   Moves extremities without difficulty  Other:    Medical Decision Making  Medically screening exam initiated at 12:23 PM.  Appropriate orders placed.  Rosaria Ferries was informed that the remainder of the evaluation will be completed by another provider, this initial triage assessment does not replace that evaluation, and the importance of remaining in the ED until their evaluation is complete.  Geriiatric patient to the ED for evaluation of transient aphasia.  Patient was last known well proximately 9 AM when he began to experience some slurred speech.  He presents to the ED via EMS from dialysis for evaluation but he was able to complete dialysis prior to arrival.   Melvenia Needles, PA-C 07/09/22 1223

## 2022-07-11 LAB — PATHOLOGIST SMEAR REVIEW

## 2022-07-12 ENCOUNTER — Emergency Department: Payer: Medicare HMO

## 2022-07-12 ENCOUNTER — Inpatient Hospital Stay
Admission: EM | Admit: 2022-07-12 | Discharge: 2022-07-15 | DRG: 064 | Disposition: A | Discharge status: Home / Self Care | Payer: Medicare HMO | Attending: Internal Medicine | Admitting: Internal Medicine

## 2022-07-12 DIAGNOSIS — E1122 Type 2 diabetes mellitus with diabetic chronic kidney disease: Secondary | ICD-10-CM | POA: Diagnosis present

## 2022-07-12 DIAGNOSIS — I120 Hypertensive chronic kidney disease with stage 5 chronic kidney disease or end stage renal disease: Secondary | ICD-10-CM | POA: Diagnosis present

## 2022-07-12 DIAGNOSIS — R4781 Slurred speech: Secondary | ICD-10-CM

## 2022-07-12 DIAGNOSIS — N2581 Secondary hyperparathyroidism of renal origin: Secondary | ICD-10-CM | POA: Diagnosis present

## 2022-07-12 DIAGNOSIS — C189 Malignant neoplasm of colon, unspecified: Secondary | ICD-10-CM | POA: Diagnosis present

## 2022-07-12 DIAGNOSIS — Z833 Family history of diabetes mellitus: Secondary | ICD-10-CM | POA: Diagnosis not present

## 2022-07-12 DIAGNOSIS — I6389 Other cerebral infarction: Secondary | ICD-10-CM | POA: Diagnosis not present

## 2022-07-12 DIAGNOSIS — I639 Cerebral infarction, unspecified: Secondary | ICD-10-CM

## 2022-07-12 DIAGNOSIS — D631 Anemia in chronic kidney disease: Secondary | ICD-10-CM | POA: Diagnosis present

## 2022-07-12 DIAGNOSIS — D6959 Other secondary thrombocytopenia: Secondary | ICD-10-CM | POA: Diagnosis present

## 2022-07-12 DIAGNOSIS — I10 Essential (primary) hypertension: Secondary | ICD-10-CM | POA: Diagnosis present

## 2022-07-12 DIAGNOSIS — Z1152 Encounter for screening for COVID-19: Secondary | ICD-10-CM | POA: Diagnosis not present

## 2022-07-12 DIAGNOSIS — C182 Malignant neoplasm of ascending colon: Secondary | ICD-10-CM | POA: Diagnosis not present

## 2022-07-12 DIAGNOSIS — Z85038 Personal history of other malignant neoplasm of large intestine: Secondary | ICD-10-CM

## 2022-07-12 DIAGNOSIS — C9110 Chronic lymphocytic leukemia of B-cell type not having achieved remission: Secondary | ICD-10-CM | POA: Diagnosis not present

## 2022-07-12 DIAGNOSIS — E8809 Other disorders of plasma-protein metabolism, not elsewhere classified: Secondary | ICD-10-CM | POA: Diagnosis present

## 2022-07-12 DIAGNOSIS — N186 End stage renal disease: Secondary | ICD-10-CM

## 2022-07-12 DIAGNOSIS — R4701 Aphasia: Secondary | ICD-10-CM | POA: Diagnosis present

## 2022-07-12 DIAGNOSIS — I440 Atrioventricular block, first degree: Secondary | ICD-10-CM | POA: Diagnosis present

## 2022-07-12 DIAGNOSIS — Z992 Dependence on renal dialysis: Secondary | ICD-10-CM

## 2022-07-12 DIAGNOSIS — Z9049 Acquired absence of other specified parts of digestive tract: Secondary | ICD-10-CM

## 2022-07-12 DIAGNOSIS — I6329 Cerebral infarction due to unspecified occlusion or stenosis of other precerebral arteries: Secondary | ICD-10-CM | POA: Diagnosis present

## 2022-07-12 DIAGNOSIS — N185 Chronic kidney disease, stage 5: Secondary | ICD-10-CM | POA: Diagnosis not present

## 2022-07-12 DIAGNOSIS — Z8249 Family history of ischemic heart disease and other diseases of the circulatory system: Secondary | ICD-10-CM | POA: Diagnosis not present

## 2022-07-12 LAB — BASIC METABOLIC PANEL
Anion gap: 11 (ref 5–15)
BUN: 26 mg/dL — ABNORMAL HIGH (ref 8–23)
CO2: 30 mmol/L (ref 22–32)
Calcium: 8.6 mg/dL — ABNORMAL LOW (ref 8.9–10.3)
Chloride: 101 mmol/L (ref 98–111)
Creatinine, Ser: 4.98 mg/dL — ABNORMAL HIGH (ref 0.61–1.24)
GFR, Estimated: 11 mL/min — ABNORMAL LOW (ref >60)
Glucose, Bld: 81 mg/dL (ref 70–99)
Potassium: 3.7 mmol/L (ref 3.5–5.1)
Sodium: 142 mmol/L (ref 135–145)

## 2022-07-12 LAB — RESP PANEL BY RT-PCR (RSV, FLU A&B, COVID)  RVPGX2
Influenza A by PCR: NEGATIVE
Influenza B by PCR: NEGATIVE
Resp Syncytial Virus by PCR: NEGATIVE
SARS Coronavirus 2 by RT PCR: NEGATIVE

## 2022-07-12 LAB — CBC
HCT: 39.5 % (ref 39.0–52.0)
Hemoglobin: 13.2 g/dL (ref 13.0–17.0)
MCH: 30.8 pg (ref 26.0–34.0)
MCHC: 33.4 g/dL (ref 30.0–36.0)
MCV: 92.1 fL (ref 80.0–100.0)
Platelets: 111 10*3/uL — ABNORMAL LOW (ref 150–400)
RBC: 4.29 MIL/uL (ref 4.22–5.81)
RDW: 14 % (ref 11.5–15.5)
WBC: 14.3 10*3/uL — ABNORMAL HIGH (ref 4.0–10.5)
nRBC: 0 % (ref 0.0–0.2)

## 2022-07-12 LAB — TROPONIN I (HIGH SENSITIVITY)
Troponin I (High Sensitivity): 53 ng/L — ABNORMAL HIGH (ref <18)
Troponin I (High Sensitivity): 53 ng/L — ABNORMAL HIGH (ref <18)

## 2022-07-12 LAB — CBG MONITORING, ED: Glucose-Capillary: 172 mg/dL — ABNORMAL HIGH (ref 70–99)

## 2022-07-12 MED ORDER — ASPIRIN 325 MG PO TABS
325.0000 mg | ORAL_TABLET | Freq: Every day | ORAL | Status: DC
  Administered 2022-07-12 – 2022-07-13 (×2): 325 mg via ORAL
  Filled 2022-07-12 (×2): qty 1

## 2022-07-12 MED ORDER — HEPARIN SODIUM (PORCINE) 5000 UNIT/ML IJ SOLN
5000.0000 [IU] | Freq: Three times a day (TID) | INTRAMUSCULAR | Status: DC
  Administered 2022-07-12 – 2022-07-15 (×8): 5000 [IU] via SUBCUTANEOUS
  Filled 2022-07-12 (×9): qty 1

## 2022-07-12 MED ORDER — INSULIN ASPART 100 UNIT/ML IJ SOLN: 0.0000 [IU] | Freq: Every day | INTRAMUSCULAR | Status: DC

## 2022-07-12 MED ORDER — ACETAMINOPHEN 160 MG/5ML PO SOLN: 650.0000 mg | ORAL | Status: DC | PRN

## 2022-07-12 MED ORDER — STROKE: EARLY STAGES OF RECOVERY BOOK: Freq: Once | Status: AC

## 2022-07-12 MED ORDER — ATORVASTATIN CALCIUM 20 MG PO TABS
40.0000 mg | ORAL_TABLET | Freq: Every day | ORAL | Status: DC
  Administered 2022-07-13 – 2022-07-15 (×3): 40 mg via ORAL
  Filled 2022-07-12 (×3): qty 2

## 2022-07-12 MED ORDER — ASPIRIN 81 MG PO CHEW: 81.0000 mg | CHEWABLE_TABLET | Freq: Every day | ORAL | Status: DC

## 2022-07-12 MED ORDER — ACETAMINOPHEN 650 MG RE SUPP: 650.0000 mg | RECTAL | Status: DC | PRN

## 2022-07-12 MED ORDER — INSULIN ASPART 100 UNIT/ML IJ SOLN: 0.0000 [IU] | Freq: Three times a day (TID) | INTRAMUSCULAR | Status: DC

## 2022-07-12 MED ORDER — ACETAMINOPHEN 325 MG PO TABS: 650.0000 mg | ORAL_TABLET | ORAL | Status: DC | PRN

## 2022-07-12 NOTE — Assessment & Plan Note (Signed)
No acute issues suspected 

## 2022-07-12 NOTE — ED Provider Notes (Signed)
[facility] Provider Note    Event Date/Time   First MD Initiated Contact with Patient 07/12/22 1553     (approximate)   History   Near Syncope   HPI  Jonathan Mcgrath is a 81 y.o. male end-stage renal disease on dialysis presents to the ER from dialysis today where he had a near syncopal event associated with some slurred speech.  This is towards the end of his dialysis session.  This happened again on the 20th he was seen in the ER for similar symptoms had extensive workup which was reassuring.  He has had some shortness of breath denies any chest pain.  Denies any numbness or tingling.  Compliant with his medications.  Blurred speech and symptoms have significantly improved.  Denies any weakness     Physical Exam   Triage Vital Signs: ED Triage Vitals  Enc Vitals Group     BP 07/12/22 1035 (!) 141/69     Pulse Rate 07/12/22 1032 66     Resp 07/12/22 1032 18     Temp 07/12/22 1032 97.9 F (36.6 C)     Temp Source 07/12/22 1032 Oral     SpO2 07/12/22 1032 93 %     Weight --      Height --      Head Circumference --      Peak Flow --      Pain Score 07/12/22 1033 0     Pain Loc --      Pain Edu? --      Excl. in [place]? --     Most recent vital signs: Vitals:   07/12/22 1930 07/12/22 2247  BP: (!) 163/70   Pulse: 65   Resp: 15   Temp:  98.2 F (36.8 C)  SpO2:       Constitutional: Alert  Eyes: Conjunctivae are normal.  Head: Atraumatic. Nose: No congestion/rhinnorhea. Mouth/Throat: Mucous membranes are moist.   Neck: Painless ROM.  Cardiovascular:   Good peripheral circulation. Respiratory: Normal respiratory effort.  No retractions.  Gastrointestinal: Soft and nontender.  Musculoskeletal:  no deformity Neurologic:  MAE spontaneously. Good strength thoughout.   Cni, family reports speech sounds different but overall his speech sounds fluent, no droop, normal fnf Skin:  Skin is warm, dry and intact. No rash noted. Psychiatric:  Mood and affect are normal. Speech and behavior are normal.    ED Results / Procedures / Treatments   Labs (all labs ordered are listed, but only abnormal results are displayed) Labs Reviewed  BASIC METABOLIC PANEL - Abnormal; Notable for the following components:      Result Value   BUN 26 (*)    Creatinine, Ser 4.98 (*)    Calcium 8.6 (*)    GFR, Estimated 11 (*)    All other components within normal limits  CBC - Abnormal; Notable for the following components:   WBC 14.3 (*)    Platelets 111 (*)    All other components within normal limits  TROPONIN I (HIGH SENSITIVITY) - Abnormal; Notable for the following components:   Troponin I (High Sensitivity) 53 (*)    All other components within normal limits  TROPONIN I (HIGH SENSITIVITY) - Abnormal; Notable for the following components:   Troponin I (High Sensitivity) 53 (*)    All other components within normal limits  RESP PANEL BY RT-PCR (RSV, FLU A&B, COVID)  [id number]  CBG MONITORING, ED     EKG  ED ECG REPORT I,  Merlyn Lot, the attending physician, personally viewed and interpreted this ECG.   Date: 07/12/2022  EKG Time: 10:40  Rate: 70  Rhythm: sinus  Axis: left  Intervals: normal  ST&T Change: nonspecific st abn, no stemi    RADIOLOGY Please see ED Course for my review and interpretation.  I personally reviewed all radiographic images ordered to evaluate for the above acute complaints and reviewed radiology reports and findings.  These findings were personally discussed with the patient.  Please see medical record for radiology report.    PROCEDURES:  Critical Care performed: No  Procedures   MEDICATIONS ORDERED IN ED: Medications - No data to display   IMPRESSION / MDM / [place] / ED COURSE  I reviewed the triage vital signs and the nursing notes.                              Differential diagnosis includes, but is not limited to, cva, tia, hypoglycemia, dehydration,  electrolyte abnormality, dissection, sepsis  Patient presenting to the ER for evaluation of symptoms as described above.  Based on symptoms, risk factors and considered above differential, this presenting complaint could reflect a potentially life-threatening illness therefore the patient will be placed on continuous pulse oximetry and telemetry for monitoring.  Laboratory evaluation will be sent to evaluate for the above complaints.  By the time of my evaluation patient outside of the window for code stroke.  CT head ordered out of triage without any evidence of hemorrhage.  Somewhat of a broad differential given near syncope.  Will check troponins.  I am can order MRI due to concern for possible CVA or TIA rather given his improvement in symptoms.   Clinical Course as of 07/12/22 2251  Wed Jul 12, 2022  1958 Troponins flat.  Patient remains hemodynamically stable. [PR]  2227 Currently awaiting MRI read.  Remains hemodynamically stable. [PR]  2245 MRI is showing evidence of acute CVA.  Patient will require hospitalization.  Family updated.  Will consult hospitalist. [PR]    Clinical Course User Index [PR] Merlyn Lot, MD   FINAL CLINICAL IMPRESSION(S) / ED DIAGNOSES   Final diagnoses:  Cerebrovascular accident (CVA), unspecified mechanism ([place])  Slurred speech     Rx / DC Orders   ED Discharge Orders     None        Note:  This document was prepared using Dragon voice recognition software and may include unintentional dictation errors.    Merlyn Lot, MD 07/12/22 2251

## 2022-07-12 NOTE — Assessment & Plan Note (Signed)
Sliding-scale insulin 

## 2022-07-12 NOTE — ED Notes (Signed)
Pt cannot make urine.

## 2022-07-12 NOTE — Assessment & Plan Note (Signed)
Reconsult for continuation of dialysis

## 2022-07-12 NOTE — Assessment & Plan Note (Signed)
Hold antihypertensives to allow for permissive hypertension 

## 2022-07-12 NOTE — H&P (Signed)
History and Physical    [name] OTL:[id number] DOB: 12-May-1941 DOA: 07/12/2022 DOS: the patient was seen and examined on 07/12/2022 PCP: Pcp, No  Patient coming from: Home  Chief Complaint:  Chief Complaint  Patient presents with   Near Syncope    HPI: Jonathan Mcgrath is a 81 y.o. male with medical history significant for hypertension, end-stage renal disease on hemodialysis who presents to the ED from dialysis for evaluation of slurred speech and near syncope that he developed near the end of his session.  Patient had a similar episode on 12/20 for which she was evaluated in the ED and discharged after reassuring workup.  He denies headache, visual disturbance, one-sided weakness numbness or tingling.  His symptoms had improved by arrival.  Patient was out of tPA window by assessment and the ED. ED course and data review: BP 183/71 with otherwise normal vitals.  WBC 14,000, troponin 53-53, respiratory viral panel negative for COVID flu and RSV.  EKG, personally viewed and interpreted showing sinus at 67 with first-degree AV block and nonspecific ST-T wave changes.  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 By the time of stroke diagnosis, patient was several hours out of tPA window having waited in the ED for over 10 hours.   Review of Systems: As mentioned in the history of present illness. All other systems reviewed and are negative.  Past Medical History:  Diagnosis Date   Cancer ([place]) 2019   colon   Chronic kidney disease    Diabetes mellitus without complication ([place])    diet controlled   Hemodialysis access site with arteriovenous graft ([place])    Hypertension    Infection due to Clostridium septicum ([place]) 08/27/2018   Renal insufficiency    Pt on dialysis x 3 years and receives every Monday, Wednesday and Friday.    Past Surgical History:  Procedure Laterality Date   AV FISTULA PLACEMENT Left 2015    arm   CATARACT EXTRACTION W/ INTRAOCULAR LENS  IMPLANT, BILATERAL Bilateral 08/2017   CENTRAL LINE INSERTION-TUNNELED N/A 04/19/2018   Procedure: CENTRAL LINE INSERTION-TUNNELED;  Surgeon: Katha Cabal, MD;  Location: [place] [facility];  Service: Cardiovascular;  Laterality: N/A;   CENTRAL VENOUS CATHETER INSERTION  07/15/2018   Procedure: Insertion Hickman Catheter;  Surgeon: Algernon Huxley, MD;  Location: [place] [facility];  Service: Cardiovascular;;   COLON RESECTION N/A 05/16/2018   Procedure: LAPAROSCOPIC  COLON RESECTION;  Surgeon: Herbert Pun, MD;  Location: [facility];  Service: General;  Laterality: N/A;   COLONOSCOPY N/A 04/18/2018   Procedure: COLONOSCOPY;  Surgeon: Virgel Manifold, MD;  Location: [facility];  Service: Endoscopy;  Laterality: N/A;   COLONOSCOPY WITH PROPOFOL N/A 05/22/2019   Procedure: COLONOSCOPY WITH PROPOFOL;  Surgeon: Virgel Manifold, MD;  Location: [facility];  Service: Endoscopy;  Laterality: N/A;   DIALYSIS/PERMA CATHETER REMOVAL N/A 06/19/2018   Procedure: DIALYSIS/PERMA CATHETER REMOVAL;  Surgeon: Algernon Huxley, MD;  Location: [place] [facility];  Service: Cardiovascular;  Laterality: N/A;   DIALYSIS/PERMA CATHETER REMOVAL N/A 09/24/2018   Procedure: DIALYSIS/PERMA CATHETER REMOVAL;  Surgeon: Katha Cabal, MD;  Location: [place] [facility];  Service: Cardiovascular;  Laterality: N/A;   ESOPHAGOGASTRODUODENOSCOPY N/A 04/18/2018   Procedure: ESOPHAGOGASTRODUODENOSCOPY (EGD);  Surgeon: Virgel Manifold, MD;  Location: [facility];  Service: Endoscopy;  Laterality: N/A;   EYE SURGERY     PELVIC ANGIOGRAPHY  07/15/2018   Procedure: PELVIC  ANGIOGRAPHY;  Surgeon: Katha Cabal, MD;  Location: [place] [facility];  Service: Cardiovascular;;   PERIPHERAL VASCULAR BALLOON ANGIOPLASTY Bilateral 07/15/2018   Procedure: PERIPHERAL VASCULAR BALLOON ANGIOPLASTY;  Surgeon: Algernon Huxley, MD;  Location: [place] [facility];   Service: Cardiovascular;  Laterality: Bilateral;   PERIPHERAL VASCULAR CATHETERIZATION Left 01/12/2016   Procedure: A/V Shuntogram/Fistulagram;  Surgeon: Algernon Huxley, MD;  Location: [place] [facility];  Service: Cardiovascular;  Laterality: Left;   PERIPHERAL VASCULAR CATHETERIZATION N/A 01/12/2016   Procedure: A/V Shunt Intervention;  Surgeon: Algernon Huxley, MD;  Location: [place] [facility];  Service: Cardiovascular;  Laterality: N/A;   PERITONEAL CATHETER INSERTION N/A    REMOVAL OF A DIALYSIS CATHETER N/A 01/14/2015   Procedure: REMOVAL OF A  PERITONEAL DIALYSIS CATHETER;  Surgeon: Algernon Huxley, MD;  Location: [facility];  Service: Vascular;  Laterality: N/A;   TEE WITHOUT CARDIOVERSION N/A 04/19/2018   Procedure: TRANSESOPHAGEAL ECHOCARDIOGRAM (TEE);  Surgeon: Teodoro Spray, MD;  Location: [facility];  Service: Cardiovascular;  Laterality: N/A;   Social History:  reports that he has never smoked. He has never used smokeless tobacco. He reports that he does not drink alcohol and does not use drugs.  No Known Allergies  Family History  Problem Relation Age of Onset   Diabetes Mother    Heart disease Father     Prior to Admission medications   Medication Sig Start Date End Date Taking? Authorizing Provider  amLODipine (NORVASC) 5 MG tablet Take 1 tablet (5 mg total) by mouth daily. 01/16/22   Fritzi Mandes, MD  B Complex-C-Folic Acid (RENA-VITE RX) 1 MG TABS Take 1 tablet by mouth daily. 11/02/21   [provider]  Ergocalciferol (VITAMIN D2) 10 MCG (400 UNIT) TABS Take 1 tab daily 01/16/22   Fritzi Mandes, MD  famotidine (PEPCID) 20 MG tablet Take 20 mg by mouth 2 (two) times daily. 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    [provider]  ferric citrate (AURYXIA) 1 GM 210 MG(Fe) tablet Take 210 mg by mouth 3 (three) times daily with meals.    [provider]  gabapentin (NEURONTIN) 100 MG capsule Take 100 mg by mouth at bedtime.    [provider]  gabapentin  (NEURONTIN) 300 MG capsule Take 300 mg by mouth daily. 11/17/21   [provider]  multivitamin (RENA-VIT) TABS tablet Take 1 tablet by mouth daily. 01/16/22   Fritzi Mandes, MD  ondansetron (ZOFRAN) 4 MG tablet Take 4 mg by mouth 3 (three) times daily as needed for nausea/vomiting. 06/07/18   [provider]  oxyCODONE (OXY IR/ROXICODONE) 5 MG immediate release tablet Take 1 tablet (5 mg total) by mouth every 6 (six) hours as needed for moderate pain. 01/16/22   Fritzi Mandes, MD  senna-docusate (SENOKOT-S) 8.6-50 MG tablet Take 1 tablet by mouth 2 (two) times daily. 01/16/22   Fritzi Mandes, MD    Physical Exam: Vitals:   07/12/22 1800 07/12/22 1843 07/12/22 1930 07/12/22 2247  BP: (!) 165/88 (!) 183/71 (!) 163/70   Pulse: 73 67 65   Resp: '20 20 15   '$ Temp:  98.3 F (36.8 C)  98.2 F (36.8 C)  TempSrc:  Oral  Oral  SpO2:  96%     Physical Exam Vitals and nursing note reviewed.  Constitutional:      General: He is not in acute distress. HENT:     Head: Normocephalic and atraumatic.  Cardiovascular:     Rate  and Rhythm: Normal rate and regular rhythm.     Heart sounds: Normal heart sounds.  Pulmonary:     Effort: Pulmonary effort is normal.     Breath sounds: Normal breath sounds.  Abdominal:     Palpations: Abdomen is soft.     Tenderness: There is no abdominal tenderness.  Neurological:     Comments: Somewhat slurred speech     Labs on Admission: I have personally reviewed following labs and imaging studies  CBC: Recent Labs  Lab 07/09/22 1226 07/12/22 1041  WBC 15.1* 14.3*  NEUTROABS 2.1  --   HGB 13.4 13.2  HCT 40.3 39.5  MCV 92.4 92.1  PLT 102* 341*   Basic Metabolic Panel: Recent Labs  Lab 07/09/22 1226 07/12/22 1041  NA 141 142  K 3.7 3.7  CL 101 101  CO2 29 30  GLUCOSE 95 81  BUN 20 26*  CREATININE 4.05* 4.98*  CALCIUM 9.1 8.6*   GFR: Estimated Creatinine Clearance: 13.1 mL/min (A) (by C-G formula based on SCr of 4.98 mg/dL (H)). Liver  Function Tests: Recent Labs  Lab 07/09/22 1226  AST 17  ALT 14  ALKPHOS 85  BILITOT 1.1  PROT 6.6  ALBUMIN 3.8   No results for input(s): "LIPASE", "AMYLASE" in the last 168 hours. No results for input(s): "AMMONIA" in the last 168 hours. Coagulation Profile: Recent Labs  Lab 07/09/22 1226  INR 1.1   Cardiac Enzymes: No results for input(s): "CKTOTAL", "CKMB", "CKMBINDEX", "TROPONINI" in the last 168 hours. BNP (last 3 results) No results for input(s): "PROBNP" in the last 8760 hours. HbA1C: No results for input(s): "HGBA1C" in the last 72 hours. CBG: Recent Labs  Lab 07/09/22 1215 07/09/22 1814  GLUCAP 91 96   Lipid Profile: No results for input(s): "CHOL", "HDL", "LDLCALC", "TRIG", "CHOLHDL", "LDLDIRECT" in the last 72 hours. Thyroid Function Tests: No results for input(s): "TSH", "T4TOTAL", "FREET4", "T3FREE", "THYROIDAB" in the last 72 hours. Anemia Panel: No results for input(s): "VITAMINB12", "FOLATE", "FERRITIN", "TIBC", "IRON", "RETICCTPCT" in the last 72 hours. Urine analysis:    Component Value Date/Time   COLORURINE Yellow 04/11/2014 2115   APPEARANCEUR Clear 04/11/2014 2115   LABSPEC 1.012 04/11/2014 2115   PHURINE 7.0 04/11/2014 2115   GLUCOSEU 150 mg/dL 04/11/2014 2115   HGBUR 2+ 04/11/2014 2115   BILIRUBINUR Negative 04/11/2014 2115   KETONESUR Negative 04/11/2014 2115   PROTEINUR >=500 04/11/2014 2115   NITRITE Negative 04/11/2014 2115   LEUKOCYTESUR Negative 04/11/2014 2115    Radiological Exams on Admission: MR BRAIN WO CONTRAST  Result Date: 07/12/2022 CLINICAL DATA:  Initial evaluation for acute TIA, near syncope EXAM: MRI HEAD WITHOUT CONTRAST TECHNIQUE: Multiplanar, multiecho pulse sequences of the brain and surrounding structures were obtained without intravenous contrast. COMPARISON:  Prior CT from earlier the same day as well as recent MRI from 07/09/2022. FINDINGS: Brain: Mild age-related cerebral atrophy. Patchy and confluent  T2/FLAIR hyperintensity involving the periventricular deep white matter both cerebral hemispheres, consistent with chronic small vessel ischemic disease. Few scatter remote lacunar infarcts present about the hemispheric cerebral white matter, deep gray nuclei, and pons. Few scattered small remote bilateral cerebellar infarcts. Subtle 1.5 cm focus of linear restricted diffusion seen involving the right paramedian pons (series 5, image 14), consistent with a small acute ischemic infarct. No associated hemorrhage or mass effect. No other evidence for acute or interval infarction. Gray-white matter differentiation otherwise maintained. No other areas of chronic cortical infarction. No acute or chronic intracranial blood products. No  mass lesion, midline shift or mass effect. No hydrocephalus or extra-axial fluid collection. Pituitary gland and suprasellar region within normal limits. Vascular: Major intracranial vascular flow voids are maintained. Skull and upper cervical spine: Craniocervical junction within normal limits. Bone marrow signal intensity mildly heterogeneous but overall within normal limits. No acute scalp soft tissue abnormality. Small lipoma noted at the left suboccipital region. Sinuses/Orbits: Prior bilateral ocular lens replacement. Scattered mucosal thickening noted about the ethmoidal air cells and maxillary sinuses. Moderate right mastoid effusion. Visualized nasopharynx unremarkable. Other: None. IMPRESSION: 1. 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 2. Underlying age-related cerebral atrophy with chronic small vessel ischemic disease, with several scattered remote lacunar infarcts involving the hemispheric cerebral white matter, deep gray nuclei, and pons. Few scattered remote bilateral cerebellar infarcts. Electronically Signed   By: Jeannine Boga M.D.   On: 07/12/2022 22:33   DG Chest Portable 1 View  Result Date: 07/12/2022 CLINICAL DATA:  Shortness  of breath. Near syncope during dialysis. Slurred speech. EXAM: PORTABLE CHEST 1 VIEW COMPARISON:  02/15/2021 FINDINGS: The patient is rotated to the right on today's radiograph, reducing diagnostic sensitivity and specificity. Mild enlargement of the cardiopericardial silhouette noted. Atherosclerotic calcification of the aortic arch. Mild prominence of upper zone lung markings suggesting cephalization of blood flow and pulmonary venous hypertension. No overt pulmonary edema. No blunting of the costophrenic angles. Left subclavian/axillary stent noted. IMPRESSION: 1. Mild enlargement of the cardiopericardial silhouette with cephalization of blood flow and pulmonary venous hypertension but no overt edema. 2. Left subclavian/axillary stent noted. 3. Atherosclerotic calcification of the aortic arch. Aortic Atherosclerosis (ICD10-I70.0). Electronically Signed   By: Van Clines M.D.   On: 07/12/2022 17:25   CT HEAD WO CONTRAST  Result Date: 07/12/2022 CLINICAL DATA:  81 year old male with near syncope and slurred speech at dialysis. Vomiting. EXAM: CT HEAD WITHOUT CONTRAST TECHNIQUE: Contiguous axial images were obtained from the base of the skull through the vertex without intravenous contrast. RADIATION DOSE REDUCTION: This exam was performed according to the departmental dose-optimization program which includes automated exposure control, adjustment of the mA and/or kV according to patient size and/or use of iterative reconstruction technique. COMPARISON:  Brain MRI and head CT 07/09/2022. FINDINGS: Brain: No midline shift, mass effect, or evidence of intracranial mass lesion. No ventriculomegaly. No acute intracranial hemorrhage identified. No cortically based acute infarct identified. Stable gray-white differentiation with fairly advanced patchy bilateral white matter hypodensity, heterogeneity in the left thalamus and brainstem, central cerebellum. Vascular: Extensive Calcified atherosclerosis at the  skull base. No suspicious intracranial vascular hyperdensity. Skull: No acute osseous abnormality identified. Sinuses/Orbits: Visualized paranasal sinuses are stable and well aerated. Right mastoid effusion is stable. Other: No acute orbit or scalp soft tissue finding. Calcified scalp vessel atherosclerosis. IMPRESSION: 1. No acute intracranial abnormality. Stable non contrast CT appearance of advanced small vessel disease. 2. Stable right mastoid effusion. Electronically Signed   By: Genevie Ann M.D.   On: 07/12/2022 10:58     Data Reviewed: Relevant notes from primary care and specialist visits, past discharge summaries as available in EHR, including Care Everywhere. Prior diagnostic testing as pertinent to current admission diagnoses Updated medications and problem lists for reconciliation ED course, including vitals, labs, imaging, treatment and response to treatment Triage notes, nursing and pharmacy notes and ED provider's notes Notable results as noted in HPI   Assessment and Plan: * Acute CVA (cerebrovascular accident) ([place]) Presyncope Patient presents with slurred speech and presyncope with MRI showing acute stroke.  Outside of tPA window by several hours and with improving symptoms Permissive hypertension for first 24-48 hrs post stroke onset: Prn Labetalol IV or Vasotec IV If BP greater than 220/120  Statins for LDL goal less than 70 ASA '81mg'$  daily, Plavix '75mg'$  daily x 3 weeks then monotherapy thereafter Telemetry, echocardiogram Avoid dextrose containing fluids, Maintain euglycemia, euthermia Neuro checks q4 hrs x 24 hrs and then per shift Head of bed 30 degrees Physical therapy/Occupational therapy/Speech therapy if failed dysphagia screen Neurology consult to follow   ESRD on hemodialysis ([place]) Reconsult for continuation of dialysis  Type 2 diabetes mellitus with stage 5 chronic kidney disease ([facility]) Sliding scale insulin  Essential hypertension Hold antihypertensives to  allow for permissive hypertension  Colon cancer ([place]) No acute issues suspected        DVT prophylaxis: Lovenox  Consults: Nephrology, neurology, Dr. Cheral Marker  Advance Care Planning:   Code Status: Prior   Family Communication: none  Disposition Plan: Back to previous home environment  Severity of Illness: The appropriate patient status for this patient is INPATIENT. Inpatient status is judged to be reasonable and necessary in order to provide the required intensity of service to ensure the patient's safety. The patient's presenting symptoms, physical exam findings, and initial radiographic and laboratory data in the context of their chronic comorbidities is felt to place them at high risk for further clinical deterioration. Furthermore, it is not anticipated that the patient will be medically stable for discharge from the hospital within 2 midnights of admission.   * I certify that at the point of admission it is my clinical judgment that the patient will require inpatient hospital care spanning beyond 2 midnights from the point of admission due to high intensity of service, high risk for further deterioration and high frequency of surveillance required.*  Author: Athena Masse, MD 07/12/2022 11:10 PM  For on call review [email or web address].

## 2022-07-12 NOTE — Assessment & Plan Note (Addendum)
Presyncope Patient presents with slurred speech and presyncope with MRI showing acute stroke.  Outside of tPA window by several hours and with improving symptoms Permissive hypertension for first 24-48 hrs post stroke onset: Prn Labetalol IV or Vasotec IV If BP greater than 220/120  Statins for LDL goal less than 70 ASA '81mg'$  daily, Plavix '75mg'$  daily x 3 weeks then monotherapy thereafter Telemetry, echocardiogram Avoid dextrose containing fluids, Maintain euglycemia, euthermia Neuro checks q4 hrs x 24 hrs and then per shift Head of bed 30 degrees Physical therapy/Occupational therapy/Speech therapy if failed dysphagia screen Neurology consult to follow

## 2022-07-12 NOTE — ED Triage Notes (Signed)
Pt here via [facility] with a near syncopal episode at dialysis. Pt had an episode of vomiting. Pt states he did had an episode of slurred speech that lasted about an hour, pt states he still feels like his speech is slurred. Pt states the last time he felt normal was yesterday. Pt denies pain.     70 180/88 114-cbg 97.9 94% RA 18G RFA '4mg'$  zofran given by ems

## 2022-07-13 ENCOUNTER — Encounter: Payer: Self-pay | Admitting: Internal Medicine

## 2022-07-13 ENCOUNTER — Inpatient Hospital Stay (HOSPITAL_COMMUNITY)
Admit: 2022-07-13 | Discharge: 2022-07-13 | Disposition: A | Discharge status: Home / Self Care | Payer: Medicare HMO | Attending: Internal Medicine | Admitting: Internal Medicine

## 2022-07-13 ENCOUNTER — Other Ambulatory Visit: Payer: Self-pay

## 2022-07-13 DIAGNOSIS — I6389 Other cerebral infarction: Secondary | ICD-10-CM | POA: Diagnosis not present

## 2022-07-13 DIAGNOSIS — N186 End stage renal disease: Secondary | ICD-10-CM | POA: Diagnosis not present

## 2022-07-13 DIAGNOSIS — I639 Cerebral infarction, unspecified: Secondary | ICD-10-CM | POA: Diagnosis not present

## 2022-07-13 DIAGNOSIS — Z992 Dependence on renal dialysis: Secondary | ICD-10-CM

## 2022-07-13 DIAGNOSIS — E1122 Type 2 diabetes mellitus with diabetic chronic kidney disease: Secondary | ICD-10-CM

## 2022-07-13 DIAGNOSIS — C182 Malignant neoplasm of ascending colon: Secondary | ICD-10-CM

## 2022-07-13 DIAGNOSIS — R4781 Slurred speech: Secondary | ICD-10-CM

## 2022-07-13 DIAGNOSIS — N185 Chronic kidney disease, stage 5: Secondary | ICD-10-CM

## 2022-07-13 DIAGNOSIS — I10 Essential (primary) hypertension: Secondary | ICD-10-CM | POA: Diagnosis not present

## 2022-07-13 LAB — CREATININE, SERUM
Creatinine, Ser: 6.77 mg/dL — ABNORMAL HIGH (ref 0.61–1.24)
GFR, Estimated: 8 mL/min — ABNORMAL LOW (ref >60)

## 2022-07-13 LAB — CBC WITH DIFFERENTIAL/PLATELET
Abs Immature Granulocytes: 0.02 10*3/uL (ref 0.00–0.07)
Basophils Absolute: 0 10*3/uL (ref 0.0–0.1)
Basophils Relative: 0 %
Eosinophils Absolute: 0.1 10*3/uL (ref 0.0–0.5)
Eosinophils Relative: 0 %
HCT: 40.7 % (ref 39.0–52.0)
Hemoglobin: 13.5 g/dL (ref 13.0–17.0)
Immature Granulocytes: 0 %
Lymphocytes Relative: 83 %
Lymphs Abs: 11.6 10*3/uL — ABNORMAL HIGH (ref 0.7–4.0)
MCH: 30.6 pg (ref 26.0–34.0)
MCHC: 33.2 g/dL (ref 30.0–36.0)
MCV: 92.3 fL (ref 80.0–100.0)
Monocytes Absolute: 0.5 10*3/uL (ref 0.1–1.0)
Monocytes Relative: 4 %
Neutro Abs: 1.8 10*3/uL (ref 1.7–7.7)
Neutrophils Relative %: 13 %
Platelets: 108 10*3/uL — ABNORMAL LOW (ref 150–400)
RBC: 4.41 MIL/uL (ref 4.22–5.81)
RDW: 14.1 % (ref 11.5–15.5)
Smear Review: NORMAL
WBC: 14.1 10*3/uL — ABNORMAL HIGH (ref 4.0–10.5)
nRBC: 0 % (ref 0.0–0.2)

## 2022-07-13 LAB — ECHOCARDIOGRAM COMPLETE
AR max vel: 1.28 cm2
AV Area VTI: 1.22 cm2
AV Area mean vel: 1.17 cm2
AV Mean grad: 22 mmHg
AV Peak grad: 41.3 mmHg
Ao pk vel: 3.22 m/s
Area-P 1/2: 2.72 cm2
Height: 73 in
MV VTI: 2.8 cm2
S' Lateral: 4.18 cm
Weight: 3252.23 oz

## 2022-07-13 LAB — LIPID PANEL
Cholesterol: 197 mg/dL (ref 0–200)
HDL: 45 mg/dL (ref >40)
LDL Cholesterol: 134 mg/dL — ABNORMAL HIGH (ref 0–99)
Total CHOL/HDL Ratio: 4.4 RATIO
Triglycerides: 91 mg/dL (ref <150)
VLDL: 18 mg/dL (ref 0–40)

## 2022-07-13 LAB — CBC
HCT: 38.9 % — ABNORMAL LOW (ref 39.0–52.0)
Hemoglobin: 12.7 g/dL — ABNORMAL LOW (ref 13.0–17.0)
MCH: 30.5 pg (ref 26.0–34.0)
MCHC: 32.6 g/dL (ref 30.0–36.0)
MCV: 93.3 fL (ref 80.0–100.0)
Platelets: 111 10*3/uL — ABNORMAL LOW (ref 150–400)
RBC: 4.17 MIL/uL — ABNORMAL LOW (ref 4.22–5.81)
RDW: 14.2 % (ref 11.5–15.5)
WBC: 15.2 10*3/uL — ABNORMAL HIGH (ref 4.0–10.5)
nRBC: 0.1 % (ref 0.0–0.2)

## 2022-07-13 LAB — COMPREHENSIVE METABOLIC PANEL
ALT: 14 U/L (ref 0–44)
AST: 18 U/L (ref 15–41)
Albumin: 3.4 g/dL — ABNORMAL LOW (ref 3.5–5.0)
Alkaline Phosphatase: 78 U/L (ref 38–126)
Anion gap: 11 (ref 5–15)
BUN: 37 mg/dL — ABNORMAL HIGH (ref 8–23)
CO2: 28 mmol/L (ref 22–32)
Calcium: 8.9 mg/dL (ref 8.9–10.3)
Chloride: 102 mmol/L (ref 98–111)
Creatinine, Ser: 7.21 mg/dL — ABNORMAL HIGH (ref 0.61–1.24)
GFR, Estimated: 7 mL/min — ABNORMAL LOW (ref >60)
Glucose, Bld: 113 mg/dL — ABNORMAL HIGH (ref 70–99)
Potassium: 4.3 mmol/L (ref 3.5–5.1)
Sodium: 141 mmol/L (ref 135–145)
Total Bilirubin: 0.8 mg/dL (ref 0.3–1.2)
Total Protein: 5.9 g/dL — ABNORMAL LOW (ref 6.5–8.1)

## 2022-07-13 LAB — PHOSPHORUS: Phosphorus: 6.8 mg/dL — ABNORMAL HIGH (ref 2.5–4.6)

## 2022-07-13 LAB — MAGNESIUM: Magnesium: 2.1 mg/dL (ref 1.7–2.4)

## 2022-07-13 LAB — CBG MONITORING, ED
Glucose-Capillary: 75 mg/dL (ref 70–99)
Glucose-Capillary: 87 mg/dL (ref 70–99)
Glucose-Capillary: 121 mg/dL — ABNORMAL HIGH (ref 70–99)
Glucose-Capillary: 92 mg/dL (ref 70–99)

## 2022-07-13 MED ORDER — LIDOCAINE HCL (PF) 1 % IJ SOLN: 5.0000 mL | INTRAMUSCULAR | Status: DC | PRN

## 2022-07-13 MED ORDER — ALTEPLASE 2 MG IJ SOLR: 2.0000 mg | Freq: Once | INTRAMUSCULAR | Status: DC | PRN

## 2022-07-13 MED ORDER — ASPIRIN 81 MG PO TBEC
81.0000 mg | DELAYED_RELEASE_TABLET | Freq: Every day | ORAL | Status: DC
  Administered 2022-07-14 – 2022-07-15 (×2): 81 mg via ORAL
  Filled 2022-07-13 (×2): qty 1

## 2022-07-13 MED ORDER — ANTICOAGULANT SODIUM CITRATE 4% (200MG/5ML) IV SOLN: 5.0000 mL | Status: DC | PRN

## 2022-07-13 MED ORDER — CHLORHEXIDINE GLUCONATE CLOTH 2 % EX PADS
6.0000 | MEDICATED_PAD | Freq: Every day | CUTANEOUS | Status: DC
  Filled 2022-07-13 (×2): qty 6

## 2022-07-13 MED ORDER — CLOPIDOGREL BISULFATE 75 MG PO TABS
75.0000 mg | ORAL_TABLET | Freq: Every day | ORAL | Status: DC
  Administered 2022-07-13 – 2022-07-15 (×3): 75 mg via ORAL
  Filled 2022-07-13 (×3): qty 1

## 2022-07-13 MED ORDER — PENTAFLUOROPROP-TETRAFLUOROETH EX AERO: 1.0000 | INHALATION_SPRAY | CUTANEOUS | Status: DC | PRN

## 2022-07-13 MED ORDER — HEPARIN SODIUM (PORCINE) 1000 UNIT/ML DIALYSIS: 1000.0000 [IU] | INTRAMUSCULAR | Status: DC | PRN

## 2022-07-13 MED ORDER — LIDOCAINE-PRILOCAINE 2.5-2.5 % EX CREA: 1.0000 | TOPICAL_CREAM | CUTANEOUS | Status: DC | PRN

## 2022-07-13 NOTE — Consult Note (Signed)
NEURO HOSPITALIST CONSULT NOTE   Requestig physician: Dr. Alfredia Ferguson  Reason for Consult: Near syncopal spell at dialysis followed by episode of slurred speech  History obtained from:  Patient and Chart     HPI:                                                                                                                                          Jonathan Mcgrath is an 81 y.o. male with a PMHx of colon cancer, CKD, DM, ESRD on HD and HTN, who presented to the ED on Wednesday morning after a near-syncopal episode towards the end of his dialysis session with associated blurry vision as well as vomiting. He had an episode of slurred speech that lasted about an hour, and on arrival to the ED he stated that he still felt like his speech was slurred. Vitals on arrival to the ED: HR 70, BP 180/88, CBG 114, temp 97.9, 94% RA. He denied any lateralized weakness or numbness, as well as no headache. Vision had returned to normal in the ED. Influenza, Covid and RSV testing was negative.   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   Of note, he had a similar spell on the 20th when he was seen in the ED for similar symptoms. Work up at that time was reassuring.   His next dialysis treatment is scheduled for tomorrow.   Past Medical History:  Diagnosis Date   Cancer ([place]) 2019   colon   Chronic kidney disease    Diabetes mellitus without complication ([place])    diet controlled   Hemodialysis access site with arteriovenous graft ([place])    Hypertension    Infection due to Clostridium septicum ([place]) 08/27/2018   Renal insufficiency    Pt on dialysis x 3 years and receives every Monday, Wednesday and Friday.     Past Surgical History:  Procedure Laterality Date   AV FISTULA PLACEMENT Left 2015   arm   CATARACT EXTRACTION W/ INTRAOCULAR LENS  IMPLANT, BILATERAL Bilateral 08/2017   CENTRAL LINE INSERTION-TUNNELED N/A 04/19/2018    Procedure: CENTRAL LINE INSERTION-TUNNELED;  Surgeon: Katha Cabal, MD;  Location: [place] [facility];  Service: Cardiovascular;  Laterality: N/A;   CENTRAL VENOUS CATHETER INSERTION  07/15/2018   Procedure: Insertion Hickman Catheter;  Surgeon: Algernon Huxley, MD;  Location: [place] [facility];  Service: Cardiovascular;;   COLON RESECTION N/A 05/16/2018   Procedure: LAPAROSCOPIC  COLON RESECTION;  Surgeon: Herbert Pun, MD;  Location: [facility];  Service: General;  Laterality: N/A;   COLONOSCOPY N/A 04/18/2018   Procedure: COLONOSCOPY;  Surgeon: Virgel Manifold, MD;  Location: [facility];  Service: Endoscopy;  Laterality: N/A;   COLONOSCOPY WITH PROPOFOL N/A 05/22/2019  Procedure: COLONOSCOPY WITH PROPOFOL;  Surgeon: Virgel Manifold, MD;  Location: [facility];  Service: Endoscopy;  Laterality: N/A;   DIALYSIS/PERMA CATHETER REMOVAL N/A 06/19/2018   Procedure: DIALYSIS/PERMA CATHETER REMOVAL;  Surgeon: Algernon Huxley, MD;  Location: [place] [facility];  Service: Cardiovascular;  Laterality: N/A;   DIALYSIS/PERMA CATHETER REMOVAL N/A 09/24/2018   Procedure: DIALYSIS/PERMA CATHETER REMOVAL;  Surgeon: Katha Cabal, MD;  Location: [place] [facility];  Service: Cardiovascular;  Laterality: N/A;   ESOPHAGOGASTRODUODENOSCOPY N/A 04/18/2018   Procedure: ESOPHAGOGASTRODUODENOSCOPY (EGD);  Surgeon: Virgel Manifold, MD;  Location: [facility];  Service: Endoscopy;  Laterality: N/A;   EYE SURGERY     PELVIC ANGIOGRAPHY  07/15/2018   Procedure: PELVIC ANGIOGRAPHY;  Surgeon: Katha Cabal, MD;  Location: [place] [facility];  Service: Cardiovascular;;   PERIPHERAL VASCULAR BALLOON ANGIOPLASTY Bilateral 07/15/2018   Procedure: PERIPHERAL VASCULAR BALLOON ANGIOPLASTY;  Surgeon: Algernon Huxley, MD;  Location: [place] [facility];  Service: Cardiovascular;  Laterality: Bilateral;   PERIPHERAL VASCULAR CATHETERIZATION Left 01/12/2016   Procedure: A/V  Shuntogram/Fistulagram;  Surgeon: Algernon Huxley, MD;  Location: [place] [facility];  Service: Cardiovascular;  Laterality: Left;   PERIPHERAL VASCULAR CATHETERIZATION N/A 01/12/2016   Procedure: A/V Shunt Intervention;  Surgeon: Algernon Huxley, MD;  Location: [place] [facility];  Service: Cardiovascular;  Laterality: N/A;   PERITONEAL CATHETER INSERTION N/A    REMOVAL OF A DIALYSIS CATHETER N/A 01/14/2015   Procedure: REMOVAL OF A  PERITONEAL DIALYSIS CATHETER;  Surgeon: Algernon Huxley, MD;  Location: [facility];  Service: Vascular;  Laterality: N/A;   TEE WITHOUT CARDIOVERSION N/A 04/19/2018   Procedure: TRANSESOPHAGEAL ECHOCARDIOGRAM (TEE);  Surgeon: Teodoro Spray, MD;  Location: [facility];  Service: Cardiovascular;  Laterality: N/A;    Family History  Problem Relation Age of Onset   Diabetes Mother    Heart disease Father               Social History:  reports that he has never smoked. He has never used smokeless tobacco. He reports that he does not drink alcohol and does not use drugs.  No Known Allergies  MEDICATIONS:                                                                                                                     No current facility-administered medications on file prior to encounter.   Current Outpatient Medications on File Prior to Encounter  Medication Sig Dispense Refill   amoxicillin-clavulanate (AUGMENTIN) 500-125 MG tablet Take 1 tablet by mouth daily.     cinacalcet (SENSIPAR) 60 MG tablet Take 60 mg by mouth daily.     amLODipine (NORVASC) 5 MG tablet Take 1 tablet (5 mg total) by mouth daily. (Patient not taking: Reported on 07/13/2022) 30 tablet 0   B Complex-C-Folic Acid (RENA-VITE RX) 1 MG TABS Take 1 tablet by mouth daily.     Ergocalciferol (VITAMIN D2) 10 MCG (400 UNIT) TABS Take 1  tab daily 30 tablet 0   famotidine (PEPCID) 20 MG tablet Take 20 mg by mouth 2 (two) times daily. 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     ferric citrate (AURYXIA) 1 GM 210 MG(Fe)  tablet Take 210 mg by mouth 3 (three) times daily with meals.     gabapentin (NEURONTIN) 100 MG capsule Take 100 mg by mouth at bedtime. (Patient not taking: Reported on 07/13/2022)     gabapentin (NEURONTIN) 300 MG capsule Take 300 mg by mouth daily. (Patient not taking: Reported on 07/13/2022)     multivitamin (RENA-VIT) TABS tablet Take 1 tablet by mouth daily. (Patient not taking: Reported on 07/13/2022) 30 tablet 11   ondansetron (ZOFRAN) 4 MG tablet Take 4 mg by mouth 3 (three) times daily as needed for nausea/vomiting.     oxyCODONE (OXY IR/ROXICODONE) 5 MG immediate release tablet Take 1 tablet (5 mg total) by mouth every 6 (six) hours as needed for moderate pain. (Patient not taking: Reported on 07/13/2022) 10 tablet 0   senna-docusate (SENOKOT-S) 8.6-50 MG tablet Take 1 tablet by mouth 2 (two) times daily. 30 tablet 0    Scheduled:  [START ON 07/14/2022] aspirin EC  81 mg Oral Daily   atorvastatin  40 mg Oral Daily   [START ON 07/14/2022] Chlorhexidine Gluconate Cloth  6 each Topical Q0600   clopidogrel  75 mg Oral Daily   heparin  5,000 Units Subcutaneous Q8H   insulin aspart  0-5 Units Subcutaneous QHS   insulin aspart  0-6 Units Subcutaneous TID WC   Continuous:  anticoagulant sodium citrate       ROS:                                                                                                                                       As per HPI.    Blood pressure (!) 175/89, pulse 64, temperature 98.2 F (36.8 C), temperature source Oral, resp. rate 20, height '6\' 1"'$  (1.854 m), weight 92.2 kg, SpO2 96 %.   General Examination:                                                                                                       Physical Exam  HEENT-  [place]/AT Lungs- Respirations unlabored Extremities- Left upper extremity fistula is prominently seen  Neurological Examination Mental Status: Awake and alert. Fully oriented. Thought content appropriate. Speech fluent  without evidence of aphasia.  No dysarthria. Able to follow all commands without difficulty. Cranial  Nerves: II: Temporal visual fields intact with no extinction to DSS. PERRL. III,IV, VI: No ptosis. EOMI. No nystagmus. V: Temp sensation equal bilaterally VII: Smile symmetric VIII: Hearing intact to voice IX,X: No hypophonia or hoarseness XI: Symmetric XII: Midline tongue extension Motor: RUE: 5/5 RLE: 5/5 LUE: 5/5 LLE: 5/5 Orbiting fingers test is normal.  Latencies of motor responses are not increased and are symmetric bilaterally.  Sensory: Temp and FT intact x 4, without asymmetry. Deep Tendon Reflexes: 1+ and symmetric bilateral biceps, brachioradialis and patellae Cerebellar: No ataxia with FNF bilaterally Gait: Deferred   Lab Results: Basic Metabolic Panel: Recent Labs  Lab 07/09/22 1226 07/12/22 1041 07/12/22 2358 07/13/22 0918  NA 141 142  --  141  K 3.7 3.7  --  4.3  CL 101 101  --  102  CO2 29 30  --  28  GLUCOSE 95 81  --  113*  BUN 20 26*  --  37*  CREATININE 4.05* 4.98* 6.77* 7.21*  CALCIUM 9.1 8.6*  --  8.9  MG  --   --   --  2.1  PHOS  --   --   --  6.8*    CBC: Recent Labs  Lab 07/09/22 1226 07/12/22 1041 07/12/22 2358 07/13/22 0918  WBC 15.1* 14.3* 15.2* 14.1*  NEUTROABS 2.1  --   --  1.8  HGB 13.4 13.2 12.7* 13.5  HCT 40.3 39.5 38.9* 40.7  MCV 92.4 92.1 93.3 92.3  PLT 102* 111* 111* 108*    Cardiac Enzymes: No results for input(s): "CKTOTAL", "CKMB", "CKMBINDEX", "TROPONINI" in the last 168 hours.  Lipid Panel: Recent Labs  Lab 07/12/22 2358  CHOL 197  TRIG 91  HDL 45  CHOLHDL 4.4  VLDL 18  LDLCALC 134*    Imaging: ECHOCARDIOGRAM COMPLETE  Result Date: 07/13/2022    ECHOCARDIOGRAM REPORT   Patient Name:   COBE VINEY Date of Exam: 07/13/2022 Medical Rec #:  [id number]           Height:       73.0 in Accession #:    [id number]          Weight:       203.3 lb Date of Birth:  02/03/1941            BSA:          2.166 m  Patient Age:    60 years            BP:           170/71 mmHg Patient Gender: M                   HR:           62 bpm. Exam Location:  [facility] Procedure: 2D Echo, Cardiac Doppler and Color Doppler Indications:     Stroke  History:         Patient has prior history of Echocardiogram examinations, most                  recent 06/17/2018. Stroke; Risk Factors:Hypertension and                  Diabetes. Colon CA, ESRD on Dialysis.  Sonographer:     Wenda Low Referring Phys:  [id number] Athena Masse Diagnosing Phys: Kathlyn Sacramento MD IMPRESSIONS  1. Left ventricular ejection fraction, by estimation, is 55 to 60%. The left ventricle has normal function. The left ventricle has no  regional wall motion abnormalities. There is moderate left ventricular hypertrophy. Left ventricular diastolic parameters are consistent with Grade II diastolic dysfunction (pseudonormalization).  2. Right ventricular systolic function is normal. The right ventricular size is normal. There is moderately elevated pulmonary artery systolic pressure.  3. Left atrial size was moderately dilated.  4. The mitral valve is normal in structure. Mild mitral valve regurgitation. No evidence of mitral stenosis.  5. The aortic valve is calcified. Aortic valve regurgitation is not visualized. Moderate aortic valve stenosis. Aortic valve area, by VTI measures 1.22 cm. Aortic valve mean gradient measures 22.0 mmHg. FINDINGS  Left Ventricle: Left ventricular ejection fraction, by estimation, is 55 to 60%. The left ventricle has normal function. The left ventricle has no regional wall motion abnormalities. The left ventricular internal cavity size was normal in size. There is  moderate left ventricular hypertrophy. Left ventricular diastolic parameters are consistent with Grade II diastolic dysfunction (pseudonormalization). Right Ventricle: The right ventricular size is normal. No increase in right ventricular wall thickness. Right ventricular systolic  function is normal. There is moderately elevated pulmonary artery systolic pressure. The tricuspid regurgitant velocity is 3.11 m/s, and with an assumed right atrial pressure of 8 mmHg, the estimated right ventricular systolic pressure is 29.9 mmHg. Left Atrium: Left atrial size was moderately dilated. Right Atrium: Right atrial size was normal in size. Pericardium: Trivial pericardial effusion is present. Mitral Valve: The mitral valve is normal in structure. Mild mitral valve regurgitation. No evidence of mitral valve stenosis. MV peak gradient, 4.8 mmHg. The mean mitral valve gradient is 2.0 mmHg. Tricuspid Valve: The tricuspid valve is normal in structure. Tricuspid valve regurgitation is mild . No evidence of tricuspid stenosis. Aortic Valve: The aortic valve is calcified. Aortic valve regurgitation is not visualized. Moderate aortic stenosis is present. Aortic valve mean gradient measures 22.0 mmHg. Aortic valve peak gradient measures 41.3 mmHg. Aortic valve area, by VTI measures 1.22 cm. Pulmonic Valve: The pulmonic valve was normal in structure. Pulmonic valve regurgitation is mild. No evidence of pulmonic stenosis. Aorta: The aortic root is normal in size and structure. Venous: The inferior vena cava was not well visualized. IAS/Shunts: No atrial level shunt detected by color flow Doppler.  LEFT VENTRICLE PLAX 2D LVIDd:         4.80 cm   Diastology LVIDs:         4.18 cm   LV e' medial:    4.57 cm/s LV PW:         1.05 cm   LV E/e' medial:  21.8 LV IVS:        1.80 cm   LV e' lateral:   6.20 cm/s LVOT diam:     2.00 cm   LV E/e' lateral: 16.0 LV SV:         98 LV SV Index:   45 LVOT Area:     3.14 cm  RIGHT VENTRICLE RV Basal diam:  3.60 cm RV Mid diam:    3.20 cm RV S prime:     13.10 cm/s TAPSE (M-mode): 3.0 cm LEFT ATRIUM             Index        RIGHT ATRIUM           Index LA diam:        5.40 cm 2.49 cm/m   RA Area:     18.90 cm LA Vol (A2C):   85.5 ml 39.46 ml/m  RA Volume:   47.80  ml  22.06  ml/m LA Vol (A4C):   87.8 ml 40.53 ml/m LA Biplane Vol: 89.9 ml 41.50 ml/m  AORTIC VALVE                     PULMONIC VALVE AV Area (Vmax):    1.28 cm      PV Vmax:       1.46 m/s AV Area (Vmean):   1.17 cm      PV Peak grad:  8.5 mmHg AV Area (VTI):     1.22 cm AV Vmax:           321.50 cm/s AV Vmean:          216.000 cm/s AV VTI:            0.804 m AV Peak Grad:      41.3 mmHg AV Mean Grad:      22.0 mmHg LVOT Vmax:         131.00 cm/s LVOT Vmean:        80.250 cm/s LVOT VTI:          0.311 m LVOT/AV VTI ratio: 0.39  AORTA Ao Root diam: 3.20 cm Ao Asc diam:  2.90 cm MITRAL VALVE                TRICUSPID VALVE MV Area (PHT): 2.72 cm     TR Peak grad:   38.7 mmHg MV Area VTI:   2.80 cm     TR Vmax:        311.00 cm/s MV Peak grad:  4.8 mmHg MV Mean grad:  2.0 mmHg     SHUNTS MV Vmax:       1.09 m/s     Systemic VTI:  0.31 m MV Vmean:      64.1 cm/s    Systemic Diam: 2.00 cm MV Decel Time: 279 msec MV E velocity: 99.50 cm/s MV A velocity: 102.00 cm/s MV E/A ratio:  0.98 Kathlyn Sacramento MD Electronically signed by Kathlyn Sacramento MD Signature Date/Time: 07/13/2022/1:07:02 PM    Final    MR BRAIN WO CONTRAST  Result Date: 07/12/2022 CLINICAL DATA:  Initial evaluation for acute TIA, near syncope EXAM: MRI HEAD WITHOUT CONTRAST TECHNIQUE: Multiplanar, multiecho pulse sequences of the brain and surrounding structures were obtained without intravenous contrast. COMPARISON:  Prior CT from earlier the same day as well as recent MRI from 07/09/2022. FINDINGS: Brain: Mild age-related cerebral atrophy. Patchy and confluent T2/FLAIR hyperintensity involving the periventricular deep white matter both cerebral hemispheres, consistent with chronic small vessel ischemic disease. Few scatter remote lacunar infarcts present about the hemispheric cerebral white matter, deep gray nuclei, and pons. Few scattered small remote bilateral cerebellar infarcts. Subtle 1.5 cm focus of linear restricted diffusion seen involving the  right paramedian pons (series 5, image 14), consistent with a small acute ischemic infarct. No associated hemorrhage or mass effect. No other evidence for acute or interval infarction. Gray-white matter differentiation otherwise maintained. No other areas of chronic cortical infarction. No acute or chronic intracranial blood products. No mass lesion, midline shift or mass effect. No hydrocephalus or extra-axial fluid collection. Pituitary gland and suprasellar region within normal limits. Vascular: Major intracranial vascular flow voids are maintained. Skull and upper cervical spine: Craniocervical junction within normal limits. Bone marrow signal intensity mildly heterogeneous but overall within normal limits. No acute scalp soft tissue abnormality. Small lipoma noted at the left suboccipital region. Sinuses/Orbits: Prior bilateral ocular lens replacement. Scattered mucosal thickening noted  about the ethmoidal air cells and maxillary sinuses. Moderate right mastoid effusion. Visualized nasopharynx unremarkable. Other: None. IMPRESSION: 1. 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 2. Underlying age-related cerebral atrophy with chronic small vessel ischemic disease, with several scattered remote lacunar infarcts involving the hemispheric cerebral white matter, deep gray nuclei, and pons. Few scattered remote bilateral cerebellar infarcts. Electronically Signed   By: Jeannine Boga M.D.   On: 07/12/2022 22:33   DG Chest Portable 1 View  Result Date: 07/12/2022 CLINICAL DATA:  Shortness of breath. Near syncope during dialysis. Slurred speech. EXAM: PORTABLE CHEST 1 VIEW COMPARISON:  02/15/2021 FINDINGS: The patient is rotated to the right on today's radiograph, reducing diagnostic sensitivity and specificity. Mild enlargement of the cardiopericardial silhouette noted. Atherosclerotic calcification of the aortic arch. Mild prominence of upper zone lung markings suggesting  cephalization of blood flow and pulmonary venous hypertension. No overt pulmonary edema. No blunting of the costophrenic angles. Left subclavian/axillary stent noted. IMPRESSION: 1. Mild enlargement of the cardiopericardial silhouette with cephalization of blood flow and pulmonary venous hypertension but no overt edema. 2. Left subclavian/axillary stent noted. 3. Atherosclerotic calcification of the aortic arch. Aortic Atherosclerosis (ICD10-I70.0). Electronically Signed   By: Van Clines M.D.   On: 07/12/2022 17:25   CT HEAD WO CONTRAST  Result Date: 07/12/2022 CLINICAL DATA:  81 year old male with near syncope and slurred speech at dialysis. Vomiting. EXAM: CT HEAD WITHOUT CONTRAST TECHNIQUE: Contiguous axial images were obtained from the base of the skull through the vertex without intravenous contrast. RADIATION DOSE REDUCTION: This exam was performed according to the departmental dose-optimization program which includes automated exposure control, adjustment of the mA and/or kV according to patient size and/or use of iterative reconstruction technique. COMPARISON:  Brain MRI and head CT 07/09/2022. FINDINGS: Brain: No midline shift, mass effect, or evidence of intracranial mass lesion. No ventriculomegaly. No acute intracranial hemorrhage identified. No cortically based acute infarct identified. Stable gray-white differentiation with fairly advanced patchy bilateral white matter hypodensity, heterogeneity in the left thalamus and brainstem, central cerebellum. Vascular: Extensive Calcified atherosclerosis at the skull base. No suspicious intracranial vascular hyperdensity. Skull: No acute osseous abnormality identified. Sinuses/Orbits: Visualized paranasal sinuses are stable and well aerated. Right mastoid effusion is stable. Other: No acute orbit or scalp soft tissue finding. Calcified scalp vessel atherosclerosis. IMPRESSION: 1. No acute intracranial abnormality. Stable non contrast CT appearance  of advanced small vessel disease. 2. Stable right mastoid effusion. Electronically Signed   By: Genevie Ann M.D.   On: 07/12/2022 10:58     Assessment: 81 year old male presenting with an acute wedge-shaped ischemic infarction in the right paramedian pons.  - Exam reveals no focal neurological deficits.  - MRI brain: 1.5 cm wedge-shaped acute ischemic right paramedian pontine ischemic infarction. Underlying age-related cerebral atrophy with extensive chronic small vessel ischemic disease. There are several scattered remote lacunar infarcts involving the hemispheric cerebral white matter, deep gray nuclei, and pons. A few scattered remote bilateral cerebellar infarcts are also noted. - TTE: LVEF 55 to 60%. The left ventricle has normal function. The left ventricle has no regional wall motion abnormalities. There is moderate left ventricular hypertrophy. Left ventricular diastolic parameters are consistent with Grade II diastolic dysfunction (pseudonormalization). Left atrial size was moderately dilated. No mural thrombus or valvular vegetation mentioned in the report.  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   Recommendations: - He has been started on ASA and Plavix. Continue DAPT x 21 days, then discontinue Plavix and continue ASA indefinitely.  - MRA head - Carotid ultrasound - Cardiac telemetry - Statin - BP management. Out of the permissive HTN time window - HgbA1c, fasting lipid panel - PT consult, OT consult, Speech consult - Risk factor modification - Frequent neuro checks - NPO until passes stroke swallow screen - Consider changing dialysis parameters. May have sustained periods of hypotension during dialysis on the 24th and 27th, which would explain his symptoms of slurred speech during dialysis on the 24th, felt to be exacerbated by dehydration,  and his spell of near syncope during dialysis on the 27th which precipitated the current admission. Additionally, it is possible that hypotension in the setting of microangiopathy could precipitate a small vessel stroke.    Electronically signed: Dr. Kerney Elbe 07/13/2022, 4:55 PM

## 2022-07-13 NOTE — Evaluation (Signed)
Physical Therapy Evaluation Patient Details Name: Jonathan Mcgrath MRN: [id number] DOB: 05/24/1941 Today's Date: 07/13/2022  History of Present Illness  81 y/o male presented to ED on 07/12/22 for slurred speech and near syncope at HD. MRI showed acute R pontine infarct. PMH: HTN, ESRD on HD, hx of colon cancer, T2DM  Clinical Impression  Patient admitted with the above. PTA, patient lives alone in first floor apartment and reports independence with [facility] and occasional use of rollator after HD dependent on fatigue level. Currently, patient presents with weakness and impaired balance. Able to complete bed mobility modI and initially requiring minA to stand with SPC with posterior LOB upon standing. Able to stand with RW with min guard and no LOB. Ambulated 200' with RW and min guard-supervision with no LOB noted. Educated patient on use of RW for mobility to reduce risk of falls, patient verbalized understanding. Patient will benefit from skilled PT services during acute stay to address listed deficits. Recommend OPPT at discharge to address balance and strength deficits.      Recommendations for follow up therapy are one component of a multi-disciplinary discharge planning process, led by the attending physician.  Recommendations may be updated based on patient status, additional functional criteria and insurance authorization.  Follow Up Recommendations Outpatient PT      Assistance Recommended at Discharge Intermittent Supervision/Assistance  Patient can return home with the following  Assistance with cooking/housework;Assist for transportation    Equipment Recommendations Rolling Kiren Mcisaac (2 wheels)  Recommendations for Other Services       Functional Status Assessment Patient has had a recent decline in their functional status and demonstrates the ability to make significant improvements in function in a reasonable and predictable amount of time.     Precautions / Restrictions  Precautions Precautions: Fall Restrictions Weight Bearing Restrictions: No      Mobility  Bed Mobility Overal bed mobility: Modified Independent                  Transfers Overall transfer level: Needs assistance Equipment used: Straight cane, Rolling Kyndal Gloster (2 wheels) Transfers: Sit to/from Stand Sit to Stand: Min assist, Min guard           General transfer comment: with SPC required minA to steady with posterior LOB back onto bed. With RW, min guard for safety with improved balance    Ambulation/Gait Ambulation/Gait assistance: Min guard, Supervision Gait Distance (Feet): 200 Feet Assistive device: Rolling Taunja Brickner (2 wheels) Gait Pattern/deviations: Step-through pattern, Decreased stride length, Narrow base of support Gait velocity: decreased     General Gait Details: min guard initially but able to progress to supervision. Slight impulsivity with obstacle avoidance. No overt LOB with RW  Stairs            Wheelchair Mobility    Modified Rankin (Stroke Patients Only)       Balance Overall balance assessment: Mild deficits observed, not formally tested                                           Pertinent Vitals/Pain Pain Assessment Pain Assessment: No/denies pain    Home Living Family/patient expects to be discharged to:: Private residence Living Arrangements: Alone Available Help at Discharge: Family;Available PRN/intermittently Type of Home: Apartment Home Access: Level entry       Home Layout: One level Home Equipment: Tub bench;Rollator (4 wheels);Cane - single  point;Grab bars - tub/shower      Prior Function Prior Level of Function : Independent/Modified Independent;Driving             Mobility Comments: uses SPC primarily. Uses rollator after HD dependent on fatigue level ADLs Comments: Independent with BADL/IADL management. Endorses driving     Hand Dominance        Extremity/Trunk Assessment    Upper Extremity Assessment Upper Extremity Assessment: Defer to OT evaluation    Lower Extremity Assessment Lower Extremity Assessment: Generalized weakness (L slightly weaker than R; hx of peripheral neuropathy in B feet)    Cervical / Trunk Assessment Cervical / Trunk Assessment: Normal  Communication   Communication: Expressive difficulties;Other (comment);HOH (slurred speech)  Cognition Arousal/Alertness: Awake/alert Behavior During Therapy: WFL for tasks assessed/performed Overall Cognitive Status: Within Functional Limits for tasks assessed                                          General Comments      Exercises     Assessment/Plan    PT Assessment Patient needs continued PT services  PT Problem List Decreased strength;Decreased activity tolerance;Decreased balance;Decreased mobility;Decreased knowledge of precautions;Decreased safety awareness       PT Treatment Interventions DME instruction;Gait training;Functional mobility training;Therapeutic activities;Therapeutic exercise;Balance training;Patient/family education    PT Goals (Current goals can be found in the Care Plan section)  Acute Rehab PT Goals Patient Stated Goal: to go home PT Goal Formulation: With patient Time For Goal Achievement: 07/27/22 Potential to Achieve Goals: Good    Frequency 7X/week     Co-evaluation               AM-PAC PT "6 Clicks" Mobility  Outcome Measure Help needed turning from your back to your side while in a flat bed without using bedrails?: None Help needed moving from lying on your back to sitting on the side of a flat bed without using bedrails?: None Help needed moving to and from a bed to a chair (including a wheelchair)?: A Little Help needed standing up from a chair using your arms (e.g., wheelchair or bedside chair)?: A Little Help needed to walk in hospital room?: A Little Help needed climbing 3-5 steps with a railing? : A Little 6 Click  Score: 20    End of Session Equipment Utilized During Treatment: Gait belt Activity Tolerance: Patient tolerated treatment well Patient left: in bed;with call bell/phone within reach Nurse Communication: Mobility status PT Visit Diagnosis: Unsteadiness on feet (R26.81);Muscle weakness (generalized) (M62.81);Other abnormalities of gait and mobility (R26.89)    Time: 0223-3612 PT Time Calculation (min) (ACUTE ONLY): 18 min   Charges:   PT Evaluation $PT Eval Low Complexity: 1 Low          Zoi Devine A. Gilford Rile PT, DPT [facility] - Acute Rehabilitation Services   Lavena Loretto A Chellsea Beckers 07/13/2022, 9:41 AM

## 2022-07-13 NOTE — Evaluation (Signed)
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) 02/15/2021   ESRD on hemodialysis ([place]) 02/15/2021   Elevated troponin 02/15/2021   Thrombocytopenia ([place]) 02/15/2021   Abnormality of albumin 08/28/2019   Personal history of colon cancer    Polyp of colon    Infection due to Clostridium septicum ([place]) 08/27/2018   Aortitis ([place]) 07/08/2018   Acute hypoxemic respiratory failure ([place]) 06/16/2018   Colon cancer ([place]) 05/16/2018   Adenomatous polyp of colon 05/03/2018   Malignant neoplasm of ascending colon ([place]) 05/03/2018   Benign neoplasm of ascending colon    Benign neoplasm of transverse colon    Polyp of sigmoid colon    Colon neoplasm    Mass of cecum    Diarrhea    Stomach irritation    Abdominal pain 04/12/2018   Encounter for screening for respiratory tuberculosis 09/10/2017   Anemia in chronic kidney disease 04/30/2017   Coagulation defect, unspecified ([place]) 04/30/2017   Dependence on renal dialysis ([place]) 04/30/2017   CAP (community acquired pneumonia) 08/17/2016   ESRD on peritoneal dialysis ([place]) 07/13/2014   Essential hypertension 07/13/2014   Type 2 diabetes mellitus with stage 5 chronic kidney disease ([place]) 07/13/2014   Diabetic peripheral neuropathy associated with type 2 diabetes mellitus ([place]) 07/13/2014   Vitreous hemorrhage, right eye ([place]) 03/02/2014   Diabetic retinopathy ([place]) 03/02/2014   Past Medical History:  Past Medical History:  Diagnosis Date   Cancer  ([place]) 2019   colon   Chronic kidney disease    Diabetes mellitus without complication ([place])    diet controlled   Hemodialysis access site with arteriovenous graft ([place])    Hypertension    Infection due to Clostridium septicum ([place]) 08/27/2018   Renal insufficiency    Pt on dialysis x 3 years and receives every Monday, Wednesday and Friday.    Past Surgical History:  Past Surgical History:  Procedure Laterality Date   AV FISTULA PLACEMENT Left 2015   arm   CATARACT EXTRACTION W/ INTRAOCULAR LENS  IMPLANT, BILATERAL Bilateral 08/2017   CENTRAL LINE INSERTION-TUNNELED N/A 04/19/2018   Procedure: CENTRAL LINE INSERTION-TUNNELED;  Surgeon: Katha Cabal, MD;  Location: [facility];  Service: Cardiovascular;  Laterality: N/A;   CENTRAL VENOUS CATHETER INSERTION  07/15/2018   Procedure: Insertion Hickman Catheter;  Surgeon: Algernon Huxley, MD;  Location: [facility];  Service: Cardiovascular;;   COLON RESECTION N/A 05/16/2018   Procedure: LAPAROSCOPIC  COLON RESECTION;  Surgeon: Herbert Pun, MD;  Location: [facility];  Service: General;  Laterality: N/A;   COLONOSCOPY N/A 04/18/2018   Procedure: COLONOSCOPY;  Surgeon: Virgel Manifold, MD;  Location: [facility] ENDOS[facility];  Service: Endoscopy;  Laterality: N/A;   COLONOSCOPY WITH PROPOFOL N/A 05/22/2019   Procedure: COLONOSCOPY WITH PROPOFOL;  Surgeon: Virgel Manifold, MD;  Location: [facility] ENDOS[facility];  Service: Endoscopy;  Laterality: N/A;   DIALYSIS/PERMA CATHETER REMOVAL N/A 06/19/2018   Procedure: DIALYSIS/PERMA CATHETER REMOVAL;  Surgeon: Algernon Huxley, MD;  Location: [facility];  Service: Cardiovascular;  Laterality: N/A;  DIALYSIS/PERMA CATHETER REMOVAL N/A 09/24/2018   Procedure: DIALYSIS/PERMA CATHETER REMOVAL;  Surgeon: Katha Cabal, MD;  Location: [facility];  Service: Cardiovascular;  Laterality: N/A;   ESOPHAGOGASTRODUODENOSCOPY N/A 04/18/2018   Procedure: ESOPHAGOGASTRODUODENOSCOPY  (EGD);  Surgeon: Virgel Manifold, MD;  Location: [facility] END[facility]COPY;  Service: Endoscopy;  Laterality: N/A;   EYE SURGERY     PELVIC ANGIOGRAPHY  07/15/2018   Procedure: PELVIC ANGIOGRAPHY;  Surgeon: Katha Cabal, MD;  Location: [facility];  Service: Cardiovascular;;   PERIPHERAL VASCULAR BALLOON ANGIOPLASTY Bilateral 07/15/2018   Procedure: PERIPHERAL VASCULAR BALLOON ANGIOPLASTY;  Surgeon: Algernon Huxley, MD;  Location: [facility];  Service: Cardiovascular;  Laterality: Bilateral;   PERIPHERAL VASCULAR CATHETERIZATION Left 01/12/2016   Procedure: A/V Shuntogram/Fistulagram;  Surgeon: Algernon Huxley, MD;  Location: [facility];  Service: Cardiovascular;  Laterality: Left;   PERIPHERAL VASCULAR CATHETERIZATION N/A 01/12/2016   Procedure: A/V Shunt Intervention;  Surgeon: Algernon Huxley, MD;  Location: [facility];  Service: Cardiovascular;  Laterality: N/A;   PERITONEAL CATHETER INSERTION N/A    REMOVAL OF A DIALYSIS CATHETER N/A 01/14/2015   Procedure: REMOVAL OF A  PERITONEAL DIALYSIS CATHETER;  Surgeon: Algernon Huxley, MD;  Location: [facility];  Service: Vascular;  Laterality: N/A;   TEE WITHOUT CARDIOVERSION N/A 04/19/2018   Procedure: TRANSESOPHAGEAL ECHOCARDIOGRAM (TEE);  Surgeon: Teodoro Spray, MD;  Location: [facility];  Service: Cardiovascular;  Laterality: N/A;   HPI:  Per H&P "Jonathan Mcgrath is a 81 y.o. male with medical history significant for hypertension, end-stage renal disease on hemodialysis who presents to the ED from dialysis for evaluation of slurred speech and near syncope that he developed near the end of his session.  Patient had a similar episode on 12/20 for which she was evaluated in the ED and discharged after reassuring workup.  He denies headache, visual disturbance, one-sided weakness numbness or tingling.  His symptoms had improved by arrival.  Patient was out of tPA window by assessment and the ED.  ED course and data review: BP  183/71 with otherwise normal vitals.  WBC 14,000, troponin 53-53, respiratory viral panel negative for COVID flu and RSV.  EKG, personally viewed and interpreted showing sinus at 67 with first-degree AV block and nonspecific ST-T wave changes.  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  By the time of stroke diagnosis, patient was several hours out of tPA window having waited in the ED for over 10 hours." MRI "1. 1.5 cm acute ischemic nonhemorrhagic right paramedian pontine  infarct.  2. Underlying age-related cerebral atrophy with chronic small vessel  ischemic disease, with several scattered remote lacunar infarcts  involving the hemispheric cerebral white matter, deep gray nuclei,  and pons. Few scattered remote bilateral cerebellar infarcts."   Assessment / Plan / Recommendation Clinical Impression  Pt seen for cognitive-communication evaluation. Pt alert, pleasant, and cooperative. HOH. HA not present. Pt endorsed changes to speech since stroke. Sister present initially.   Assessment completed via informal means and portions of Cognistat. Suspect hearing status affecting comprehension as pt occasionally benefited from extra time and repetition of verbal stimuli. Pt demonstrated grossly intact cognitive-linguistic functioning including attention, memory (immediate, short term, functional), problem solving, abstract reasoning, and insight. Pt with mild dysarthria c/b fast rate of speech and articulatory imprecision. Pt's speech 80-90% intelligible to an unfamiliar listener. Pt benefits from cues for increased vocal loudness and slowed rate of  speech. Listener benefits from known context.   Based on today's assessment, anticipate need for post-acute SLP services. SLP to continue to follow while pt in house for speech tx.   Pt educated re: role of SLP, results of assessment, basic communication strategies, and SLP POC. Pt verbalized  understanding/agreement.    SLP Assessment  SLP Recommendation/Assessment: Patient needs continued Speech [facility] SLP Visit Diagnosis: Dysarthria and anarthria (R47.1)    Recommendations for follow up therapy are one component of a multi-disciplinary discharge planning process, led by the attending physician.  Recommendations may be updated based on patient status, additional functional criteria and insurance authorization.    Follow Up Recommendations  Outpatient SLP    Assistance Recommended at Discharge  Intermittent Supervision/Assistance  Functional Status Assessment Patient has had a recent decline in their functional status and demonstrates the ability to make significant improvements in function in a reasonable and predictable amount of time.  Frequency and Duration min 2x/week  2 weeks      SLP Evaluation Cognition  Overall Cognitive Status: Within Functional Limits for tasks assessed Arousal/Alertness: Awake/alert Orientation Level: Oriented X4 Memory: Appears intact Awareness: Appears intact Problem Solving: Appears intact Safety/Judgment: Appears intact       Comprehension  Auditory Comprehension Overall Auditory Comprehension: Appears within functional limits for tasks assessed Yes/No Questions: Within Functional Limits Commands: Within Functional Limits Conversation:  [facility]) Other Conversation Comments: extra time; occasional repetition Interfering Components: Hearing    Expression Expression Primary Mode of Expression: Verbal Verbal Expression Overall Verbal Expression: Impaired Initiation: No impairment Automatic Speech:  (WFL) Level of Generative/Spontaneous Verbalization: Conversation Repetition: No impairment Naming: No impairment Pragmatics: No impairment Interfering Components: Speech intelligibility   Oral / Motor  Oral Motor/Sensory Function Overall Oral Motor/Sensory Function: Within functional limits Motor  Speech Overall Motor Speech: Impaired Respiration: Within functional limits Phonation: Normal Resonance: Within functional limits Articulation: Impaired Level of Impairment: Conversation (across all levels) Intelligibility: Intelligibility reduced Phrase: 75-100% accurate Sentence: 75-100% accurate Conversation: 75-100% accurate Motor Planning: Witnin functional limits Motor Speech Errors: Not applicable Interfering Components: Hearing loss Effective Techniques: Slow rate;Increased vocal intensity           Cherrie Gauze, M.S., [facility] [phone] [place])  Quintella Baton 07/13/2022, 9:27 AM

## 2022-07-13 NOTE — ED Notes (Signed)
Pt was able to ambulate with walker to bathroom

## 2022-07-13 NOTE — Progress Notes (Signed)
[facility]  ROUNDING NOTE   Subjective:   [name] is a 81 y.o.male with past medical history of hypertension, diabetes, and end stage renal disease on hemodialysis. Patient presents to the ED from dialysis center after reportedly having a syncopal episode. He has been admitted for Acute CVA (cerebrovascular accident) [facility]) [I63.9]  Patient is known to our practice and receives outpatient dialysis treatments at [facility] on a MWF schedule, supervised by Dr. Holley Raring.  Last treatment received on Wednesday.  Patient states he felt fine during his treatment until the end.  Reports dizziness and blurred vision.  He notified staff.  Patient was brought to emergency department for further evaluation.  Patient seen and evaluated on stretcher.  No family at bedside.  Alert and oriented.  Slurred speech noted on ED arrival.  Denies headache or visual disturbances at this time.  ED note states initial symptoms had resolved prior to ED arrival.  Labs on ED arrival significant for BUN 26, creatinine 4.98 with GFR 11, calcium 8.6, and WBCs 14.3.  Respiratory panel negative for influenza, COVID-19, and RSV.  CT head shows a stable right mastoid effusion.  Chest x-ray suspicious for cardiopericardial silhouette enlargement, pulmonary versus hypertension.  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  We have been consulted to manage dialysis needs.   Objective:  Vital signs in last 24 hours:  Temp:  [98.2 F (36.8 C)-98.4 F (36.9 C)] 98.4 F (36.9 C) (12/28 0816) Pulse Rate:  [63-73] 63 (12/28 1200) Resp:  [8-21] 21 (12/28 1200) BP: (152-183)/(68-88) 175/69 (12/28 1200) SpO2:  [96 %] 96 % (12/28 0816) Weight:  [92.2 kg] 92.2 kg (12/28 0941)  Weight change:  Filed Weights   07/13/22 0941  Weight: 92.2 kg    Intake/Output: No intake/output data recorded.   Intake/Output this shift:  No intake/output data  recorded.  Physical Exam: General: NAD, resting comfortably  Head: Normocephalic, atraumatic. Moist oral mucosal membranes  Eyes: Anicteric  Lungs:  Clear to auscultation, normal effort, room air  Heart: Regular rate and rhythm  Abdomen:  Soft, nontender  Extremities: No peripheral edema.  Neurologic: Nonfocal, moving all four extremities  Skin: No lesions  Access: Left upper aVF    Basic Metabolic Panel: Recent Labs  Lab 07/09/22 1226 07/12/22 1041 07/12/22 2358 07/13/22 0918  NA 141 142  --  141  K 3.7 3.7  --  4.3  CL 101 101  --  102  CO2 29 30  --  28  GLUCOSE 95 81  --  113*  BUN 20 26*  --  37*  CREATININE 4.05* 4.98* 6.77* 7.21*  CALCIUM 9.1 8.6*  --  8.9  MG  --   --   --  2.1  PHOS  --   --   --  6.8*    Liver Function Tests: Recent Labs  Lab 07/09/22 1226 07/13/22 0918  AST 17 18  ALT 14 14  ALKPHOS 85 78  BILITOT 1.1 0.8  PROT 6.6 5.9*  ALBUMIN 3.8 3.4*   No results for input(s): "LIPASE", "AMYLASE" in the last 168 hours. No results for input(s): "AMMONIA" in the last 168 hours.  CBC: Recent Labs  Lab 07/09/22 1226 07/12/22 1041 07/12/22 2358 07/13/22 0918  WBC 15.1* 14.3* 15.2* 14.1*  NEUTROABS 2.1  --   --  1.8  HGB 13.4 13.2 12.7* 13.5  HCT 40.3 39.5 38.9* 40.7  MCV 92.4 92.1 93.3 92.3  PLT 102* 111* 111* 108*    Cardiac Enzymes: No results for input(s): "CKTOTAL", "CKMB", "CKMBINDEX", "TROPONINI" in the last 168 hours.  BNP: Invalid input(s): "POCBNP"  CBG: Recent Labs  Lab 07/09/22 1215 07/09/22 1814 07/12/22 2344 07/13/22 0831 07/13/22 1159  GLUCAP 91 96 172* 75 44    Microbiology: Results for orders placed or performed during the hospital encounter of 07/12/22  Resp panel by RT-PCR (RSV, Flu A&B, Covid) Anterior Nasal Swab     Status: None   Collection Time: 07/12/22  4:31 PM   Specimen: Anterior Nasal Swab  Result Value Ref Range Status   SARS Coronavirus 2 by RT PCR NEGATIVE NEGATIVE Final    Comment:  (NOTE) SARS-CoV-2 target nucleic acids are NOT DETECTED.  The SARS-CoV-2 RNA is generally detectable in upper respiratory specimens during the acute phase of infection. The lowest concentration of SARS-CoV-2 viral copies this assay can detect is 138 copies/mL. A negative result does not preclude SARS-Cov-2 infection and should not be used as the sole basis for treatment or other patient management decisions. A negative result may occur with  improper specimen collection/handling, submission of specimen other than nasopharyngeal swab, presence of viral mutation(s) within the areas targeted by this assay, and inadequate number of viral copies(<138 copies/mL). A negative result must be combined with clinical observations, patient history, and epidemiological information. The expected result is Negative.  Fact Sheet for Patients:  EntrepreneurPulse.com.au  Fact Sheet for Healthcare Providers:  IncredibleEmployment.be  This test is no t yet approved or cleared by the [place] FDA and  has been authorized for detection and/or diagnosis of SARS-CoV-2 by FDA under an Emergency Use Authorization (EUA). This EUA will remain  in effect (meaning this test can be used) for the duration of the COVID-19 declaration under Section 564(b)(1) of the Act, 21 U.S.C.section 360bbb-3(b)(1), unless the authorization is terminated  or revoked sooner.       Influenza A by PCR NEGATIVE NEGATIVE Final   Influenza B by PCR NEGATIVE NEGATIVE Final    Comment: (NOTE) The Xpert Xpress SARS-CoV-2/FLU/RSV plus assay is intended as an aid in the diagnosis of influenza from Nasopharyngeal swab specimens and should not be used as a sole basis for treatment. Nasal washings and aspirates are unacceptable for Xpert Xpress SARS-CoV-2/FLU/RSV testing.  Fact Sheet for Patients: EntrepreneurPulse.com.au  Fact Sheet for Healthcare  Providers: IncredibleEmployment.be  This test is not yet approved or cleared by the [place] FDA and has been authorized for detection and/or diagnosis of SARS-CoV-2 by FDA under an Emergency Use Authorization (EUA). This EUA will remain in effect (meaning this test can be used) for the duration of the COVID-19 declaration under Section 564(b)(1) of the Act, 21 U.S.C. section 360bbb-3(b)(1), unless the authorization is terminated or revoked.     Resp Syncytial Virus by PCR NEGATIVE NEGATIVE Final    Comment: (NOTE) Fact Sheet for Patients: EntrepreneurPulse.com.au  Fact Sheet for Healthcare Providers: IncredibleEmployment.be  This test is not yet approved or cleared by the [place] FDA and has been authorized for detection and/or diagnosis of SARS-CoV-2 by FDA under an Emergency Use Authorization (EUA). This EUA will remain in effect (meaning this test can be used) for the duration of the COVID-19 declaration under Section 564(b)(1) of the Act, 21 U.S.C. section 360bbb-3(b)(1), unless the authorization is terminated or revoked.  Performed at [facility], [address] [place], [place] [address]     Coagulation Studies: No results for input(s): "LABPROT", "INR" in the  last 72 hours.  Urinalysis: No results for input(s): "COLORURINE", "LABSPEC", "PHURINE", "GLUCOSEU", "HGBUR", "BILIRUBINUR", "KETONESUR", "PROTEINUR", "UROBILINOGEN", "NITRITE", "LEUKOCYTESUR" in the last 72 hours.  Invalid input(s): "APPERANCEUR"    Imaging: ECHOCARDIOGRAM COMPLETE  Result Date: 07/13/2022    ECHOCARDIOGRAM REPORT   Patient Name:   Jonathan Mcgrath Date of Exam: 07/13/2022 Medical Rec #:  [id number]           Height:       73.0 in Accession #:    [id number]          Weight:       203.3 lb Date of Birth:  08-30-1940            BSA:          2.166 m Patient Age:    14 years            BP:           170/71 mmHg  Patient Gender: M                   HR:           62 bpm. Exam Location:  [facility] Procedure: 2D Echo, Cardiac Doppler and Color Doppler Indications:     Stroke  History:         Patient has prior history of Echocardiogram examinations, most                  recent 06/17/2018. Stroke; Risk Factors:Hypertension and                  Diabetes. Colon CA, ESRD on Dialysis.  Sonographer:     Wenda Low Referring Phys:  [id number] Athena Masse Diagnosing Phys: Kathlyn Sacramento MD IMPRESSIONS  1. Left ventricular ejection fraction, by estimation, is 55 to 60%. The left ventricle has normal function. The left ventricle has no regional wall motion abnormalities. There is moderate left ventricular hypertrophy. Left ventricular diastolic parameters are consistent with Grade II diastolic dysfunction (pseudonormalization).  2. Right ventricular systolic function is normal. The right ventricular size is normal. There is moderately elevated pulmonary artery systolic pressure.  3. Left atrial size was moderately dilated.  4. The mitral valve is normal in structure. Mild mitral valve regurgitation. No evidence of mitral stenosis.  5. The aortic valve is calcified. Aortic valve regurgitation is not visualized. Moderate aortic valve stenosis. Aortic valve area, by VTI measures 1.22 cm. Aortic valve mean gradient measures 22.0 mmHg. FINDINGS  Left Ventricle: Left ventricular ejection fraction, by estimation, is 55 to 60%. The left ventricle has normal function. The left ventricle has no regional wall motion abnormalities. The left ventricular internal cavity size was normal in size. There is  moderate left ventricular hypertrophy. Left ventricular diastolic parameters are consistent with Grade II diastolic dysfunction (pseudonormalization). Right Ventricle: The right ventricular size is normal. No increase in right ventricular wall thickness. Right ventricular systolic function is normal. There is moderately elevated pulmonary artery  systolic pressure. The tricuspid regurgitant velocity is 3.11 m/s, and with an assumed right atrial pressure of 8 mmHg, the estimated right ventricular systolic pressure is 69.4 mmHg. Left Atrium: Left atrial size was moderately dilated. Right Atrium: Right atrial size was normal in size. Pericardium: Trivial pericardial effusion is present. Mitral Valve: The mitral valve is normal in structure. Mild mitral valve regurgitation. No evidence of mitral valve stenosis. MV peak gradient, 4.8 mmHg. The mean mitral valve gradient is 2.0 mmHg. Tricuspid Valve: The tricuspid  valve is normal in structure. Tricuspid valve regurgitation is mild . No evidence of tricuspid stenosis. Aortic Valve: The aortic valve is calcified. Aortic valve regurgitation is not visualized. Moderate aortic stenosis is present. Aortic valve mean gradient measures 22.0 mmHg. Aortic valve peak gradient measures 41.3 mmHg. Aortic valve area, by VTI measures 1.22 cm. Pulmonic Valve: The pulmonic valve was normal in structure. Pulmonic valve regurgitation is mild. No evidence of pulmonic stenosis. Aorta: The aortic root is normal in size and structure. Venous: The inferior vena cava was not well visualized. IAS/Shunts: No atrial level shunt detected by color flow Doppler.  LEFT VENTRICLE PLAX 2D LVIDd:         4.80 cm   Diastology LVIDs:         4.18 cm   LV e' medial:    4.57 cm/s LV PW:         1.05 cm   LV E/e' medial:  21.8 LV IVS:        1.80 cm   LV e' lateral:   6.20 cm/s LVOT diam:     2.00 cm   LV E/e' lateral: 16.0 LV SV:         98 LV SV Index:   45 LVOT Area:     3.14 cm  RIGHT VENTRICLE RV Basal diam:  3.60 cm RV Mid diam:    3.20 cm RV S prime:     13.10 cm/s TAPSE (M-mode): 3.0 cm LEFT ATRIUM             Index        RIGHT ATRIUM           Index LA diam:        5.40 cm 2.49 cm/m   RA Area:     18.90 cm LA Vol (A2C):   85.5 ml 39.46 ml/m  RA Volume:   47.80 ml  22.06 ml/m LA Vol (A4C):   87.8 ml 40.53 ml/m LA Biplane Vol: 89.9 ml  41.50 ml/m  AORTIC VALVE                     PULMONIC VALVE AV Area (Vmax):    1.28 cm      PV Vmax:       1.46 m/s AV Area (Vmean):   1.17 cm      PV Peak grad:  8.5 mmHg AV Area (VTI):     1.22 cm AV Vmax:           321.50 cm/s AV Vmean:          216.000 cm/s AV VTI:            0.804 m AV Peak Grad:      41.3 mmHg AV Mean Grad:      22.0 mmHg LVOT Vmax:         131.00 cm/s LVOT Vmean:        80.250 cm/s LVOT VTI:          0.311 m LVOT/AV VTI ratio: 0.39  AORTA Ao Root diam: 3.20 cm Ao Asc diam:  2.90 cm MITRAL VALVE                TRICUSPID VALVE MV Area (PHT): 2.72 cm     TR Peak grad:   38.7 mmHg MV Area VTI:   2.80 cm     TR Vmax:        311.00 cm/s MV Peak grad:  4.8 mmHg MV  Mean grad:  2.0 mmHg     SHUNTS MV Vmax:       1.09 m/s     Systemic VTI:  0.31 m MV Vmean:      64.1 cm/s    Systemic Diam: 2.00 cm MV Decel Time: 279 msec MV E velocity: 99.50 cm/s MV A velocity: 102.00 cm/s MV E/A ratio:  0.98 Kathlyn Sacramento MD Electronically signed by Kathlyn Sacramento MD Signature Date/Time: 07/13/2022/1:07:02 PM    Final    MR BRAIN WO CONTRAST  Result Date: 07/12/2022 CLINICAL DATA:  Initial evaluation for acute TIA, near syncope EXAM: MRI HEAD WITHOUT CONTRAST TECHNIQUE: Multiplanar, multiecho pulse sequences of the brain and surrounding structures were obtained without intravenous contrast. COMPARISON:  Prior CT from earlier the same day as well as recent MRI from 07/09/2022. FINDINGS: Brain: Mild age-related cerebral atrophy. Patchy and confluent T2/FLAIR hyperintensity involving the periventricular deep white matter both cerebral hemispheres, consistent with chronic small vessel ischemic disease. Few scatter remote lacunar infarcts present about the hemispheric cerebral white matter, deep gray nuclei, and pons. Few scattered small remote bilateral cerebellar infarcts. Subtle 1.5 cm focus of linear restricted diffusion seen involving the right paramedian pons (series 5, image 14), consistent with a small  acute ischemic infarct. No associated hemorrhage or mass effect. No other evidence for acute or interval infarction. Gray-white matter differentiation otherwise maintained. No other areas of chronic cortical infarction. No acute or chronic intracranial blood products. No mass lesion, midline shift or mass effect. No hydrocephalus or extra-axial fluid collection. Pituitary gland and suprasellar region within normal limits. Vascular: Major intracranial vascular flow voids are maintained. Skull and upper cervical spine: Craniocervical junction within normal limits. Bone marrow signal intensity mildly heterogeneous but overall within normal limits. No acute scalp soft tissue abnormality. Small lipoma noted at the left suboccipital region. Sinuses/Orbits: Prior bilateral ocular lens replacement. Scattered mucosal thickening noted about the ethmoidal air cells and maxillary sinuses. Moderate right mastoid effusion. Visualized nasopharynx unremarkable. Other: None. IMPRESSION: 1. 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 2. Underlying age-related cerebral atrophy with chronic small vessel ischemic disease, with several scattered remote lacunar infarcts involving the hemispheric cerebral white matter, deep gray nuclei, and pons. Few scattered remote bilateral cerebellar infarcts. Electronically Signed   By: Jeannine Boga M.D.   On: 07/12/2022 22:33   DG Chest Portable 1 View  Result Date: 07/12/2022 CLINICAL DATA:  Shortness of breath. Near syncope during dialysis. Slurred speech. EXAM: PORTABLE CHEST 1 VIEW COMPARISON:  02/15/2021 FINDINGS: The patient is rotated to the right on today's radiograph, reducing diagnostic sensitivity and specificity. Mild enlargement of the cardiopericardial silhouette noted. Atherosclerotic calcification of the aortic arch. Mild prominence of upper zone lung markings suggesting cephalization of blood flow and pulmonary venous hypertension. No overt  pulmonary edema. No blunting of the costophrenic angles. Left subclavian/axillary stent noted. IMPRESSION: 1. Mild enlargement of the cardiopericardial silhouette with cephalization of blood flow and pulmonary venous hypertension but no overt edema. 2. Left subclavian/axillary stent noted. 3. Atherosclerotic calcification of the aortic arch. Aortic Atherosclerosis (ICD10-I70.0). Electronically Signed   By: Van Clines M.D.   On: 07/12/2022 17:25   CT HEAD WO CONTRAST  Result Date: 07/12/2022 CLINICAL DATA:  81 year old male with near syncope and slurred speech at dialysis. Vomiting. EXAM: CT HEAD WITHOUT CONTRAST TECHNIQUE: Contiguous axial images were obtained from the base of the skull through the vertex without intravenous contrast. RADIATION DOSE REDUCTION: This exam was performed according to the departmental  dose-optimization program which includes automated exposure control, adjustment of the mA and/or kV according to patient size and/or use of iterative reconstruction technique. COMPARISON:  Brain MRI and head CT 07/09/2022. FINDINGS: Brain: No midline shift, mass effect, or evidence of intracranial mass lesion. No ventriculomegaly. No acute intracranial hemorrhage identified. No cortically based acute infarct identified. Stable gray-white differentiation with fairly advanced patchy bilateral white matter hypodensity, heterogeneity in the left thalamus and brainstem, central cerebellum. Vascular: Extensive Calcified atherosclerosis at the skull base. No suspicious intracranial vascular hyperdensity. Skull: No acute osseous abnormality identified. Sinuses/Orbits: Visualized paranasal sinuses are stable and well aerated. Right mastoid effusion is stable. Other: No acute orbit or scalp soft tissue finding. Calcified scalp vessel atherosclerosis. IMPRESSION: 1. No acute intracranial abnormality. Stable non contrast CT appearance of advanced small vessel disease. 2. Stable right mastoid effusion.  Electronically Signed   By: Genevie Ann M.D.   On: 07/12/2022 10:58     Medications:     aspirin  325 mg Oral Daily   atorvastatin  40 mg Oral Daily   heparin  5,000 Units Subcutaneous Q8H   insulin aspart  0-5 Units Subcutaneous QHS   insulin aspart  0-6 Units Subcutaneous TID WC   acetaminophen **OR** acetaminophen (TYLENOL) oral liquid 160 mg/5 mL **OR** acetaminophen  Assessment/ Plan:  [name] is a 81 y.o.  male with past medical history of hypertension, diabetes, and end stage renal disease on hemodialysis. Patient presents to the ED from dialysis center after reportedly having a syncopal episode. He has been admitted for Acute CVA (cerebrovascular accident) ([place]) [I63.9]  [facility] [place]/MWF/left aVF  End-stage renal disease on hemodialysis.  Will maintain outpatient schedule if possible.  Last treatment received on Wednesday.  Next treatment scheduled for Friday.  2. Anemia of chronic kidney disease Lab Results  Component Value Date   HGB 13.5 07/13/2022    Hemoglobin above desired goal.  No need for ESA's at this time.  3. Secondary Hyperparathyroidism: with outpatient labs: PTH 953, phosphorus 6.4, calcium 9.4 on 07/05/2022  Lab Results  Component Value Date   PTH 141 (H) 08/18/2016   CALCIUM 8.9 07/13/2022   PHOS 6.8 (H) 07/13/2022    Currently prescribed Cinacalcet  and Auryxia outpatient.  Calcium within desired range.  Phosphorus remains elevated.  Would continue binders with meals.  4.  Hypertension with chronic kidney disease.  Outpatient regimen includes amlodipine.  Currently held.  5.  Acute CVA seen on MRI brain.  Outside of tPA window with improving symptoms.  Neurology consulted.   LOS: 1 Christ Fullenwider 12/28/20232:37 PM

## 2022-07-13 NOTE — Progress Notes (Signed)
*  PRELIMINARY RESULTS* Echocardiogram 2D Echocardiogram has been performed.  Elpidio Anis 07/13/2022, 12:06 PM

## 2022-07-13 NOTE — Progress Notes (Signed)
OT Cancellation Note  Patient Details Name: Jonathan Mcgrath MRN: [id number] DOB: [birth year]   Cancelled Treatment:    Reason Eval/Treat Not Completed: Other (comment) (attempted to see pt, staff to perform echo entered room prior to mobility, intervention. OT will reattempt as able.Shanon Payor, OTD OTR/L  07/13/22, 11:23 AM

## 2022-07-13 NOTE — Progress Notes (Signed)
PROGRESS NOTE    Jonathan Mcgrath  ZLD:[id number] DOB: 1940-10-06 DOA: 07/12/2022 PCP: Pcp, No   Brief Narrative:  HPI per Dr. Judd Gaudier on 07/12/22 Jonathan Mcgrath is a 81 y.o. male with medical history significant for hypertension, end-stage renal disease on hemodialysis who presents to the ED from dialysis for evaluation of slurred speech and near syncope that he developed near the end of his session.  Patient had a similar episode on 12/20 for which she was evaluated in the ED and discharged after reassuring workup.  He denies headache, visual disturbance, one-sided weakness numbness or tingling.  His symptoms had improved by arrival.  Patient was out of tPA window by assessment and the ED. ED course and data review: BP 183/71 with otherwise normal vitals.  WBC 14,000, troponin 53-53, respiratory viral panel negative for COVID flu and RSV.  EKG, personally viewed and interpreted showing sinus at 67 with first-degree AV block and nonspecific ST-T wave changes.  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 By the time of stroke diagnosis, patient was several hours out of tPA window having waited in the ED for over 10 hours.    **Interim History Patient is currently undergoing stroke workup and nephrology was consulted for maintenance of dialysis and plans for dialysis tomorrow.  Assessment and Plan: * Acute CVA (cerebrovascular accident) ([place]) Presyncope -Head CT done and showed "No acute intracranial abnormality. Stable non contrast CT appearance of advanced small vessel disease. 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 Underlying age-related cerebral atrophy with chronic small vessel ischemic disease, with several scattered remote lacunar infarcts involving the hemispheric cerebral  white matter, deep gray nuclei, and pons. Few scattered remote bilateral cerebellar infarcts." -Outside of tPA window by several hours and with improving symptoms -Permissive hypertension for first 24-48 hrs post stroke onset: Prn Labetalol IV or Vasotec IV If BP greater than 220/120  -Statins for LDL goal less than 70; currently his lipid panel shows a total cholesterol/HDL ratio 4.4, cholesterol level 197, HDL 45, LDL 134, triglycerides of 91 and VLDL of 18 -Check HbA1c -Initiated on ASA 325 mg daily and will defer to neurology to initiate '81mg'$  daily; also started on Plavix '75mg'$  daily x 3 weeks then monotherapy thereafter -Defer to neurology about CTA of the head and neck versus MRA of the head and neck -Continue with telemetry, echocardiogram -ECHO done and showed EF of 55-60% with Moderate LVH and Grade 2 DD -Avoid dextrose containing fluids, Maintain euglycemia, euthermia -Neuro checks q4 hrs x 24 hrs and then per shift -Head of bed 30 degrees -Physical therapy/Occupational therapy/Speech therapy to evaluate and treat -Neurology will be notified for further evaluation and recocmendations  ESRD on Hemodialysis [facility]) Hyperphosphatemia -Nephrology consulted for continuation of dialysis -BUN/Cr Trend: Recent Labs  Lab 07/09/22 1226 07/12/22 1041 07/12/22 2358 07/13/22 0918  BUN 20 26*  --  37*  CREATININE 4.05* 4.98* 6.77* 7.21*  -Avoid Nephrotoxic Medications, Contrast Dyes, Hypotension and Dehydration to Ensure Adequate Renal Perfusion and will need to Renally Adjust Meds -Patient's Phos Level is now 6.8 -Continue to Monitor and Trend Renal Function carefully and repeat CMP in the AM   Type 2 diabetes mellitus with stage 5 chronic kidney disease ([place]) -Continue with sliding scale insulin -CBGs ranging from 87-172  Essential Hypertension -Hold antihypertensives to allow for permissive hypertension -  Continue monitor blood pressures per protocol -Last blood pressure reading was  elevated at 175/89  Colon cancer (HCC) -No acute issues suspected  Leukocytosis -Likely reactive -Patient's WBC went from 14.3 -> 15.2 -> 14.1 -Continue to Monitor and Trend and repeat CBC in the AM   Thrombocytopenia -Patient's Platelet Count went from 111 -> 111 -> 108 -Continue to Monitor for S/Sx of Bleeding; No overt bleeding noted -Repeat CBC in the AM   Hypoalbuminemia  -Patient's Albumin Level is now 3.4 -Continue to Monitor and Trend -Repeat CMP in the AM   DVT prophylaxis: heparin injection 5,000 Units Start: 07/12/22 2330    Code Status: Full Code Family Communication: No family present at bedside   Disposition Plan:  Level of care: Progressive Status is: Inpatient Remains inpatient appropriate because: Has neck acute CVA need PT OT to further evaluate and treat and will undergo maintenance of dialysis tomorrow   Consultants:  Neurology Nephrology   Procedures:  ECHOCARDIOGRAM IMPRESSIONS     1. Left ventricular ejection fraction, by estimation, is 55 to 60%. The  left ventricle has normal function. The left ventricle has no regional  wall motion abnormalities. There is moderate left ventricular hypertrophy.  Left ventricular diastolic  parameters are consistent with Grade II diastolic dysfunction  (pseudonormalization).   2. Right ventricular systolic function is normal. The right ventricular  size is normal. There is moderately elevated pulmonary artery systolic  pressure.   3. Left atrial size was moderately dilated.   4. The mitral valve is normal in structure. Mild mitral valve  regurgitation. No evidence of mitral stenosis.   5. The aortic valve is calcified. Aortic valve regurgitation is not  visualized. Moderate aortic valve stenosis. Aortic valve area, by VTI  measures 1.22 cm. Aortic valve mean gradient measures 22.0 mmHg.   FINDINGS   Left Ventricle: Left ventricular ejection fraction, by estimation, is 55  to 60%. The left ventricle has  normal function. The left ventricle has no  regional wall motion abnormalities. The left ventricular internal cavity  size was normal in size. There is   moderate left ventricular hypertrophy. Left ventricular diastolic  parameters are consistent with Grade II diastolic dysfunction  (pseudonormalization).   Right Ventricle: The right ventricular size is normal. No increase in  right ventricular wall thickness. Right ventricular systolic function is  normal. There is moderately elevated pulmonary artery systolic pressure.  The tricuspid regurgitant velocity is  3.11 m/s, and with an assumed right atrial pressure of 8 mmHg, the  estimated right ventricular systolic pressure is 02.4 mmHg.   Left Atrium: Left atrial size was moderately dilated.   Right Atrium: Right atrial size was normal in size.   Pericardium: Trivial pericardial effusion is present.   Mitral Valve: The mitral valve is normal in structure. Mild mitral valve  regurgitation. No evidence of mitral valve stenosis. MV peak gradient, 4.8  mmHg. The mean mitral valve gradient is 2.0 mmHg.   Tricuspid Valve: The tricuspid valve is normal in structure. Tricuspid  valve regurgitation is mild . No evidence of tricuspid stenosis.   Aortic Valve: The aortic valve is calcified. Aortic valve regurgitation is  not visualized. Moderate aortic stenosis is present. Aortic valve mean  gradient measures 22.0 mmHg. Aortic valve peak gradient measures 41.3  mmHg. Aortic valve area, by VTI  measures 1.22 cm.   Pulmonic Valve: The pulmonic valve was normal in structure. Pulmonic valve  regurgitation is mild. No evidence of pulmonic stenosis.   Aorta: The  aortic root is normal in size and structure.   Venous: The inferior vena cava was not well visualized.   IAS/Shunts: No atrial level shunt detected by color flow Doppler.     LEFT VENTRICLE  PLAX 2D  LVIDd:         4.80 cm   Diastology  LVIDs:         4.18 cm   LV e' medial:     4.57 cm/s  LV PW:         1.05 cm   LV E/e' medial:  21.8  LV IVS:        1.80 cm   LV e' lateral:   6.20 cm/s  LVOT diam:     2.00 cm   LV E/e' lateral: 16.0  LV SV:         98  LV SV Index:   45  LVOT Area:     3.14 cm     RIGHT VENTRICLE  RV Basal diam:  3.60 cm  RV Mid diam:    3.20 cm  RV S prime:     13.10 cm/s  TAPSE (M-mode): 3.0 cm   LEFT ATRIUM             Index        RIGHT ATRIUM           Index  LA diam:        5.40 cm 2.49 cm/m   RA Area:     18.90 cm  LA Vol (A2C):   85.5 ml 39.46 ml/m  RA Volume:   47.80 ml  22.06 ml/m  LA Vol (A4C):   87.8 ml 40.53 ml/m  LA Biplane Vol: 89.9 ml 41.50 ml/m   AORTIC VALVE                     PULMONIC VALVE  AV Area (Vmax):    1.28 cm      PV Vmax:       1.46 m/s  AV Area (Vmean):   1.17 cm      PV Peak grad:  8.5 mmHg  AV Area (VTI):     1.22 cm  AV Vmax:           321.50 cm/s  AV Vmean:          216.000 cm/s  AV VTI:            0.804 m  AV Peak Grad:      41.3 mmHg  AV Mean Grad:      22.0 mmHg  LVOT Vmax:         131.00 cm/s  LVOT Vmean:        80.250 cm/s  LVOT VTI:          0.311 m  LVOT/AV VTI ratio: 0.39    AORTA  Ao Root diam: 3.20 cm  Ao Asc diam:  2.90 cm   MITRAL VALVE                TRICUSPID VALVE  MV Area (PHT): 2.72 cm     TR Peak grad:   38.7 mmHg  MV Area VTI:   2.80 cm     TR Vmax:        311.00 cm/s  MV Peak grad:  4.8 mmHg  MV Mean grad:  2.0 mmHg     SHUNTS  MV Vmax:       1.09 m/s  Systemic VTI:  0.31 m  MV Vmean:      64.1 cm/s    Systemic Diam: 2.00 cm  MV Decel Time: 279 msec  MV E velocity: 99.50 cm/s  MV A velocity: 102.00 cm/s  MV E/A ratio:  0.98    Antimicrobials:  Anti-infectives (From admission, onward)    None       Subjective: Seen and examined at bedside and thinks he is doing little bit better and states that his slurred speech is improved.  Denies any nausea vomiting states that he he knew something was off in his dialysis center.  He denies any chest pain or  shortness of breath and no lightheadedness or dizziness today.  Objective: Vitals:   07/13/22 1330 07/13/22 1430 07/13/22 1500 07/13/22 1508  BP: (!) 182/79 (!) 142/71 (!) 175/89 (!) 175/89  Pulse: 68 88 (!) 44 64  Resp:    20  Temp:    98.2 F (36.8 C)  TempSrc:    Oral  SpO2:    96%  Weight:      Height:       No intake or output data in the 24 hours ending 07/13/22 1533 Filed Weights   07/13/22 0941  Weight: 92.2 kg   Examination: Physical Exam:  Constitutional: WN/WD overweight elderly African-American male in no acute distress appears calm Respiratory: Diminished to auscultation bilaterally with coarse breath sounds, no wheezing, rales, rhonchi or crackles. Normal respiratory effort and patient is not tachypenic. No accessory muscle use.  Unlabored breathing Cardiovascular: RRR, no murmurs / rubs / gallops. S1 and S2 auscultated. No extremity edema. Abdomen: Soft, non-tender, slightly distended secondary body habitus. Bowel sounds positive.  GU: Deferred. Musculoskeletal: No clubbing / cyanosis of digits/nails. No joint deformity upper and lower extremities.  Has a left-sided arm AV fistula Skin: No rashes, lesions, ulcers on limited skin evaluation. No induration; Warm and dry.  Neurologic: CN 2-12 grossly intact with no focal deficits. Romberg sign and cerebellar reflexes not assessed.  Psychiatric: Normal judgment and insight. Alert and oriented x 3. Normal mood and appropriate affect.   Data Reviewed: I have personally reviewed following labs and imaging studies  CBC: Recent Labs  Lab 07/09/22 1226 07/12/22 1041 07/12/22 2358 07/13/22 0918  WBC 15.1* 14.3* 15.2* 14.1*  NEUTROABS 2.1  --   --  1.8  HGB 13.4 13.2 12.7* 13.5  HCT 40.3 39.5 38.9* 40.7  MCV 92.4 92.1 93.3 92.3  PLT 102* 111* 111* 865*   Basic Metabolic Panel: Recent Labs  Lab 07/09/22 1226 07/12/22 1041 07/12/22 2358 07/13/22 0918  NA 141 142  --  141  K 3.7 3.7  --  4.3  CL 101 101  --   102  CO2 29 30  --  28  GLUCOSE 95 81  --  113*  BUN 20 26*  --  37*  CREATININE 4.05* 4.98* 6.77* 7.21*  CALCIUM 9.1 8.6*  --  8.9  MG  --   --   --  2.1  PHOS  --   --   --  6.8*   GFR: Estimated Creatinine Clearance: 9.1 mL/min (A) (by C-G formula based on SCr of 7.21 mg/dL (H)). Liver Function Tests: Recent Labs  Lab 07/09/22 1226 07/13/22 0918  AST 17 18  ALT 14 14  ALKPHOS 85 78  BILITOT 1.1 0.8  PROT 6.6 5.9*  ALBUMIN 3.8 3.4*   No results for input(s): "LIPASE", "AMYLASE" in the last 168 hours. No results for  input(s): "AMMONIA" in the last 168 hours. Coagulation Profile: Recent Labs  Lab 07/09/22 1226  INR 1.1   Cardiac Enzymes: No results for input(s): "CKTOTAL", "CKMB", "CKMBINDEX", "TROPONINI" in the last 168 hours. BNP (last 3 results) No results for input(s): "PROBNP" in the last 8760 hours. HbA1C: No results for input(s): "HGBA1C" in the last 72 hours. CBG: Recent Labs  Lab 07/09/22 1215 07/09/22 1814 07/12/22 2344 07/13/22 0831 07/13/22 1159  GLUCAP 91 96 172* 75 87   Lipid Profile: Recent Labs    07/12/22 2358  CHOL 197  HDL 45  LDLCALC 134*  TRIG 91  CHOLHDL 4.4   Thyroid Function Tests: No results for input(s): "TSH", "T4TOTAL", "FREET4", "T3FREE", "THYROIDAB" in the last 72 hours. Anemia Panel: No results for input(s): "VITAMINB12", "FOLATE", "FERRITIN", "TIBC", "IRON", "RETICCTPCT" in the last 72 hours. Sepsis Labs: No results for input(s): "PROCALCITON", "LATICACIDVEN" in the last 168 hours.  Recent Results (from the past 240 hour(s))  Resp panel by RT-PCR (RSV, Flu A&B, Covid) Anterior Nasal Swab     Status: None   Collection Time: 07/12/22  4:31 PM   Specimen: Anterior Nasal Swab  Result Value Ref Range Status   SARS Coronavirus 2 by RT PCR NEGATIVE NEGATIVE Final    Comment: (NOTE) SARS-CoV-2 target nucleic acids are NOT DETECTED.  The SARS-CoV-2 RNA is generally detectable in upper respiratory specimens during the  acute phase of infection. The lowest concentration of SARS-CoV-2 viral copies this assay can detect is 138 copies/mL. A negative result does not preclude SARS-Cov-2 infection and should not be used as the sole basis for treatment or other patient management decisions. A negative result may occur with  improper specimen collection/handling, submission of specimen other than nasopharyngeal swab, presence of viral mutation(s) within the areas targeted by this assay, and inadequate number of viral copies(<138 copies/mL). A negative result must be combined with clinical observations, patient history, and epidemiological information. The expected result is Negative.  Fact Sheet for Patients:  EntrepreneurPulse.com.au  Fact Sheet for Healthcare Providers:  IncredibleEmployment.be  This test is no t yet approved or cleared by the [place] FDA and  has been authorized for detection and/or diagnosis of SARS-CoV-2 by FDA under an Emergency Use Authorization (EUA). This EUA will remain  in effect (meaning this test can be used) for the duration of the COVID-19 declaration under Section 564(b)(1) of the Act, 21 U.S.C.section 360bbb-3(b)(1), unless the authorization is terminated  or revoked sooner.       Influenza A by PCR NEGATIVE NEGATIVE Final   Influenza B by PCR NEGATIVE NEGATIVE Final    Comment: (NOTE) The Xpert Xpress SARS-CoV-2/FLU/RSV plus assay is intended as an aid in the diagnosis of influenza from Nasopharyngeal swab specimens and should not be used as a sole basis for treatment. Nasal washings and aspirates are unacceptable for Xpert Xpress SARS-CoV-2/FLU/RSV testing.  Fact Sheet for Patients: EntrepreneurPulse.com.au  Fact Sheet for Healthcare Providers: IncredibleEmployment.be  This test is not yet approved or cleared by the [place] FDA and has been authorized for detection and/or  diagnosis of SARS-CoV-2 by FDA under an Emergency Use Authorization (EUA). This EUA will remain in effect (meaning this test can be used) for the duration of the COVID-19 declaration under Section 564(b)(1) of the Act, 21 U.S.C. section 360bbb-3(b)(1), unless the authorization is terminated or revoked.     Resp Syncytial Virus by PCR NEGATIVE NEGATIVE Final    Comment: (NOTE) Fact Sheet for Patients: EntrepreneurPulse.com.au  Fact Sheet for  Healthcare Providers: IncredibleEmployment.be  This test is not yet approved or cleared by the [place] and has been authorized for detection and/or diagnosis of SARS-CoV-2 by FDA under an Emergency Use Authorization (EUA). This EUA will remain in effect (meaning this test can be used) for the duration of the COVID-19 declaration under Section 564(b)(1) of the Act, 21 U.S.C. section 360bbb-3(b)(1), unless the authorization is terminated or revoked.  Performed at [facility], [address] [place], [place] [address]     Radiology Studies: ECHOCARDIOGRAM COMPLETE  Result Date: 07/13/2022    ECHOCARDIOGRAM REPORT   Patient Name:   Jonathan Mcgrath Date of Exam: 07/13/2022 Medical Rec #:  [id number]           Height:       73.0 in Accession #:    [id number]          Weight:       203.3 lb Date of Birth:  1941-01-17            BSA:          2.166 m Patient Age:    73 years            BP:           170/71 mmHg Patient Gender: M                   HR:           62 bpm. Exam Location:  [facility] Procedure: 2D Echo, Cardiac Doppler and Color Doppler Indications:     Stroke  History:         Patient has prior history of Echocardiogram examinations, most                  recent 06/17/2018. Stroke; Risk Factors:Hypertension and                  Diabetes. Colon CA, ESRD on Dialysis.  Sonographer:     Wenda Low Referring Phys:  [id number] Athena Masse Diagnosing Phys: Kathlyn Sacramento MD IMPRESSIONS  1. Left  ventricular ejection fraction, by estimation, is 55 to 60%. The left ventricle has normal function. The left ventricle has no regional wall motion abnormalities. There is moderate left ventricular hypertrophy. Left ventricular diastolic parameters are consistent with Grade II diastolic dysfunction (pseudonormalization).  2. Right ventricular systolic function is normal. The right ventricular size is normal. There is moderately elevated pulmonary artery systolic pressure.  3. Left atrial size was moderately dilated.  4. The mitral valve is normal in structure. Mild mitral valve regurgitation. No evidence of mitral stenosis.  5. The aortic valve is calcified. Aortic valve regurgitation is not visualized. Moderate aortic valve stenosis. Aortic valve area, by VTI measures 1.22 cm. Aortic valve mean gradient measures 22.0 mmHg. FINDINGS  Left Ventricle: Left ventricular ejection fraction, by estimation, is 55 to 60%. The left ventricle has normal function. The left ventricle has no regional wall motion abnormalities. The left ventricular internal cavity size was normal in size. There is  moderate left ventricular hypertrophy. Left ventricular diastolic parameters are consistent with Grade II diastolic dysfunction (pseudonormalization). Right Ventricle: The right ventricular size is normal. No increase in right ventricular wall thickness. Right ventricular systolic function is normal. There is moderately elevated pulmonary artery systolic pressure. The tricuspid regurgitant velocity is 3.11 m/s, and with an assumed right atrial pressure of 8 mmHg, the estimated right ventricular systolic pressure is 46.8 mmHg. Left Atrium: Left  atrial size was moderately dilated. Right Atrium: Right atrial size was normal in size. Pericardium: Trivial pericardial effusion is present. Mitral Valve: The mitral valve is normal in structure. Mild mitral valve regurgitation. No evidence of mitral valve stenosis. MV peak gradient, 4.8 mmHg.  The mean mitral valve gradient is 2.0 mmHg. Tricuspid Valve: The tricuspid valve is normal in structure. Tricuspid valve regurgitation is mild . No evidence of tricuspid stenosis. Aortic Valve: The aortic valve is calcified. Aortic valve regurgitation is not visualized. Moderate aortic stenosis is present. Aortic valve mean gradient measures 22.0 mmHg. Aortic valve peak gradient measures 41.3 mmHg. Aortic valve area, by VTI measures 1.22 cm. Pulmonic Valve: The pulmonic valve was normal in structure. Pulmonic valve regurgitation is mild. No evidence of pulmonic stenosis. Aorta: The aortic root is normal in size and structure. Venous: The inferior vena cava was not well visualized. IAS/Shunts: No atrial level shunt detected by color flow Doppler.  LEFT VENTRICLE PLAX 2D LVIDd:         4.80 cm   Diastology LVIDs:         4.18 cm   LV e' medial:    4.57 cm/s LV PW:         1.05 cm   LV E/e' medial:  21.8 LV IVS:        1.80 cm   LV e' lateral:   6.20 cm/s LVOT diam:     2.00 cm   LV E/e' lateral: 16.0 LV SV:         98 LV SV Index:   45 LVOT Area:     3.14 cm  RIGHT VENTRICLE RV Basal diam:  3.60 cm RV Mid diam:    3.20 cm RV S prime:     13.10 cm/s TAPSE (M-mode): 3.0 cm LEFT ATRIUM             Index        RIGHT ATRIUM           Index LA diam:        5.40 cm 2.49 cm/m   RA Area:     18.90 cm LA Vol (A2C):   85.5 ml 39.46 ml/m  RA Volume:   47.80 ml  22.06 ml/m LA Vol (A4C):   87.8 ml 40.53 ml/m LA Biplane Vol: 89.9 ml 41.50 ml/m  AORTIC VALVE                     PULMONIC VALVE AV Area (Vmax):    1.28 cm      PV Vmax:       1.46 m/s AV Area (Vmean):   1.17 cm      PV Peak grad:  8.5 mmHg AV Area (VTI):     1.22 cm AV Vmax:           321.50 cm/s AV Vmean:          216.000 cm/s AV VTI:            0.804 m AV Peak Grad:      41.3 mmHg AV Mean Grad:      22.0 mmHg LVOT Vmax:         131.00 cm/s LVOT Vmean:        80.250 cm/s LVOT VTI:          0.311 m LVOT/AV VTI ratio: 0.39  AORTA Ao Root diam: 3.20 cm Ao Asc  diam:  2.90 cm MITRAL VALVE  TRICUSPID VALVE MV Area (PHT): 2.72 cm     TR Peak grad:   38.7 mmHg MV Area VTI:   2.80 cm     TR Vmax:        311.00 cm/s MV Peak grad:  4.8 mmHg MV Mean grad:  2.0 mmHg     SHUNTS MV Vmax:       1.09 m/s     Systemic VTI:  0.31 m MV Vmean:      64.1 cm/s    Systemic Diam: 2.00 cm MV Decel Time: 279 msec MV E velocity: 99.50 cm/s MV A velocity: 102.00 cm/s MV E/A ratio:  0.98 Kathlyn Sacramento MD Electronically signed by Kathlyn Sacramento MD Signature Date/Time: 07/13/2022/1:07:02 PM    Final    MR BRAIN WO CONTRAST  Result Date: 07/12/2022 CLINICAL DATA:  Initial evaluation for acute TIA, near syncope EXAM: MRI HEAD WITHOUT CONTRAST TECHNIQUE: Multiplanar, multiecho pulse sequences of the brain and surrounding structures were obtained without intravenous contrast. COMPARISON:  Prior CT from earlier the same day as well as recent MRI from 07/09/2022. FINDINGS: Brain: Mild age-related cerebral atrophy. Patchy and confluent T2/FLAIR hyperintensity involving the periventricular deep white matter both cerebral hemispheres, consistent with chronic small vessel ischemic disease. Few scatter remote lacunar infarcts present about the hemispheric cerebral white matter, deep gray nuclei, and pons. Few scattered small remote bilateral cerebellar infarcts. Subtle 1.5 cm focus of linear restricted diffusion seen involving the right paramedian pons (series 5, image 14), consistent with a small acute ischemic infarct. No associated hemorrhage or mass effect. No other evidence for acute or interval infarction. Gray-white matter differentiation otherwise maintained. No other areas of chronic cortical infarction. No acute or chronic intracranial blood products. No mass lesion, midline shift or mass effect. No hydrocephalus or extra-axial fluid collection. Pituitary gland and suprasellar region within normal limits. Vascular: Major intracranial vascular flow voids are maintained. Skull and  upper cervical spine: Craniocervical junction within normal limits. Bone marrow signal intensity mildly heterogeneous but overall within normal limits. No acute scalp soft tissue abnormality. Small lipoma noted at the left suboccipital region. Sinuses/Orbits: Prior bilateral ocular lens replacement. Scattered mucosal thickening noted about the ethmoidal air cells and maxillary sinuses. Moderate right mastoid effusion. Visualized nasopharynx unremarkable. Other: None. IMPRESSION: 1. 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 2. Underlying age-related cerebral atrophy with chronic small vessel ischemic disease, with several scattered remote lacunar infarcts involving the hemispheric cerebral white matter, deep gray nuclei, and pons. Few scattered remote bilateral cerebellar infarcts. Electronically Signed   By: Jeannine Boga M.D.   On: 07/12/2022 22:33   DG Chest Portable 1 View  Result Date: 07/12/2022 CLINICAL DATA:  Shortness of breath. Near syncope during dialysis. Slurred speech. EXAM: PORTABLE CHEST 1 VIEW COMPARISON:  02/15/2021 FINDINGS: The patient is rotated to the right on today's radiograph, reducing diagnostic sensitivity and specificity. Mild enlargement of the cardiopericardial silhouette noted. Atherosclerotic calcification of the aortic arch. Mild prominence of upper zone lung markings suggesting cephalization of blood flow and pulmonary venous hypertension. No overt pulmonary edema. No blunting of the costophrenic angles. Left subclavian/axillary stent noted. IMPRESSION: 1. Mild enlargement of the cardiopericardial silhouette with cephalization of blood flow and pulmonary venous hypertension but no overt edema. 2. Left subclavian/axillary stent noted. 3. Atherosclerotic calcification of the aortic arch. Aortic Atherosclerosis (ICD10-I70.0). Electronically Signed   By: Van Clines M.D.   On: 07/12/2022 17:25   CT HEAD WO CONTRAST  Result Date:  07/12/2022 CLINICAL  DATA:  81 year old male with near syncope and slurred speech at dialysis. Vomiting. EXAM: CT HEAD WITHOUT CONTRAST TECHNIQUE: Contiguous axial images were obtained from the base of the skull through the vertex without intravenous contrast. RADIATION DOSE REDUCTION: This exam was performed according to the departmental dose-optimization program which includes automated exposure control, adjustment of the mA and/or kV according to patient size and/or use of iterative reconstruction technique. COMPARISON:  Brain MRI and head CT 07/09/2022. FINDINGS: Brain: No midline shift, mass effect, or evidence of intracranial mass lesion. No ventriculomegaly. No acute intracranial hemorrhage identified. No cortically based acute infarct identified. Stable gray-white differentiation with fairly advanced patchy bilateral white matter hypodensity, heterogeneity in the left thalamus and brainstem, central cerebellum. Vascular: Extensive Calcified atherosclerosis at the skull base. No suspicious intracranial vascular hyperdensity. Skull: No acute osseous abnormality identified. Sinuses/Orbits: Visualized paranasal sinuses are stable and well aerated. Right mastoid effusion is stable. Other: No acute orbit or scalp soft tissue finding. Calcified scalp vessel atherosclerosis. IMPRESSION: 1. No acute intracranial abnormality. Stable non contrast CT appearance of advanced small vessel disease. 2. Stable right mastoid effusion. Electronically Signed   By: Genevie Ann M.D.   On: 07/12/2022 10:58    Scheduled Meds:  aspirin  325 mg Oral Daily   atorvastatin  40 mg Oral Daily   [START ON 07/14/2022] Chlorhexidine Gluconate Cloth  6 each Topical Q0600   heparin  5,000 Units Subcutaneous Q8H   insulin aspart  0-5 Units Subcutaneous QHS   insulin aspart  0-6 Units Subcutaneous TID WC   Continuous Infusions:  anticoagulant sodium citrate      LOS: 1 day   Raiford Noble, DO [facility] Available via Epic  secure chat 7am-7pm After these hours, please refer to coverage provider listed on [email or web address] 07/13/2022, 3:33 PM

## 2022-07-13 NOTE — ED Notes (Signed)
Fsbs 92

## 2022-07-13 NOTE — Discharge Planning (Signed)
ESTABLISHED HEMODIALYSIS Outpatient Facility: [facility]  [address], [place] [address] [phone]   Scheduled Days: Monday Wednesday and Friday  Treatment Time: 06:00am  Confirmed above schedule with Olivia Mackie. Per Olivia Mackie, patient still drives himself to treatments.

## 2022-07-14 ENCOUNTER — Inpatient Hospital Stay: Payer: Medicare HMO

## 2022-07-14 DIAGNOSIS — N186 End stage renal disease: Secondary | ICD-10-CM | POA: Diagnosis not present

## 2022-07-14 DIAGNOSIS — I639 Cerebral infarction, unspecified: Secondary | ICD-10-CM | POA: Diagnosis not present

## 2022-07-14 DIAGNOSIS — I10 Essential (primary) hypertension: Secondary | ICD-10-CM | POA: Diagnosis not present

## 2022-07-14 DIAGNOSIS — C182 Malignant neoplasm of ascending colon: Secondary | ICD-10-CM | POA: Diagnosis not present

## 2022-07-14 LAB — CBC WITH DIFFERENTIAL/PLATELET
Abs Immature Granulocytes: 0.03 10*3/uL (ref 0.00–0.07)
Basophils Absolute: 0 10*3/uL (ref 0.0–0.1)
Basophils Relative: 0 %
Eosinophils Absolute: 0.1 10*3/uL (ref 0.0–0.5)
Eosinophils Relative: 1 %
HCT: 40.2 % (ref 39.0–52.0)
Hemoglobin: 13.5 g/dL (ref 13.0–17.0)
Immature Granulocytes: 0 %
Lymphocytes Relative: 81 %
Lymphs Abs: 11.6 10*3/uL — ABNORMAL HIGH (ref 0.7–4.0)
MCH: 30.9 pg (ref 26.0–34.0)
MCHC: 33.6 g/dL (ref 30.0–36.0)
MCV: 92 fL (ref 80.0–100.0)
Monocytes Absolute: 0.5 10*3/uL (ref 0.1–1.0)
Monocytes Relative: 4 %
Neutro Abs: 2 10*3/uL (ref 1.7–7.7)
Neutrophils Relative %: 14 %
Platelets: 101 10*3/uL — ABNORMAL LOW (ref 150–400)
RBC: 4.37 MIL/uL (ref 4.22–5.81)
RDW: 13.8 % (ref 11.5–15.5)
WBC: 14.2 10*3/uL — ABNORMAL HIGH (ref 4.0–10.5)
nRBC: 0 % (ref 0.0–0.2)

## 2022-07-14 LAB — COMPREHENSIVE METABOLIC PANEL
ALT: 14 U/L (ref 0–44)
AST: 17 U/L (ref 15–41)
Albumin: 3.3 g/dL — ABNORMAL LOW (ref 3.5–5.0)
Alkaline Phosphatase: 75 U/L (ref 38–126)
Anion gap: 12 (ref 5–15)
BUN: 55 mg/dL — ABNORMAL HIGH (ref 8–23)
CO2: 26 mmol/L (ref 22–32)
Calcium: 8.9 mg/dL (ref 8.9–10.3)
Chloride: 104 mmol/L (ref 98–111)
Creatinine, Ser: 8.94 mg/dL — ABNORMAL HIGH (ref 0.61–1.24)
GFR, Estimated: 5 mL/min — ABNORMAL LOW (ref >60)
Glucose, Bld: 76 mg/dL (ref 70–99)
Potassium: 4.6 mmol/L (ref 3.5–5.1)
Sodium: 142 mmol/L (ref 135–145)
Total Bilirubin: 1 mg/dL (ref 0.3–1.2)
Total Protein: 5.9 g/dL — ABNORMAL LOW (ref 6.5–8.1)

## 2022-07-14 LAB — MAGNESIUM: Magnesium: 2.3 mg/dL (ref 1.7–2.4)

## 2022-07-14 LAB — HEMOGLOBIN A1C
Hgb A1c MFr Bld: 5.8 % — ABNORMAL HIGH (ref 4.8–5.6)
Mean Plasma Glucose: 120 mg/dL

## 2022-07-14 LAB — HEPATITIS B SURFACE ANTIGEN: Hepatitis B Surface Ag: NONREACTIVE

## 2022-07-14 LAB — CBG MONITORING, ED
Glucose-Capillary: 82 mg/dL (ref 70–99)
Glucose-Capillary: 86 mg/dL (ref 70–99)

## 2022-07-14 LAB — PHOSPHORUS: Phosphorus: 7.8 mg/dL — ABNORMAL HIGH (ref 2.5–4.6)

## 2022-07-14 MED ORDER — ONDANSETRON HCL 4 MG/2ML IJ SOLN
4.0000 mg | Freq: Four times a day (QID) | INTRAMUSCULAR | Status: DC | PRN
  Administered 2022-07-14: 4 mg via INTRAVENOUS
  Filled 2022-07-14: qty 2

## 2022-07-14 NOTE — Plan of Care (Addendum)
Carotid ultrasound:  Right ICA: Mild calcified plaque of the carotid bifurcation.  Left ICA: Mild diffuse atheromatous plaque of the common carotid artery. Bilateral vertebral arteries:  Antegrade flow   MRA head:  1. Short segment occlusion of the left anterior cerebral artery A2 segment. 2. 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 3. Mild stenosis of the proximal left P2 segment.   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  - Stroke work up complete - OT has recommended home health OT versus outpatient OT, pending progress - Speech therapy: At this time, pt has met all ST goals and is appropriate for discharge from services.   - Patient was not available for PT evaluation this AM (Friday)   Recommendations: - He has been started on ASA and Plavix. Continue DAPT x 21 days, then discontinue Plavix and continue ASA indefinitely.  - Statin - BP management. Out of the permissive HTN time window - PT input regarding outpatient PT versus rehab. - Consider changing dialysis parameters. May have sustained periods of hypotension during dialysis on the 24th and 27th, which would explain his symptoms of slurred speech during dialysis on the 24th, felt to be exacerbated by dehydration, and his spell of near syncope during dialysis on the 27th which precipitated the current admission. Additionally, it is possible that hypotension in the setting of microangiopathy could precipitate a small vessel stroke.  - Neurohospitalist service will sign off. Please call if there are additional questions.   Electronically signed: Dr. Kerney Elbe

## 2022-07-14 NOTE — ED Notes (Signed)
Daughter given up date, ok per pt to speak with her. Pt up with cane to bathroom steady.

## 2022-07-14 NOTE — ED Notes (Signed)
Going to dialysis  via W/C

## 2022-07-14 NOTE — ED Notes (Signed)
Attempted to contact daughter for update. Call goes straight to voicemail.

## 2022-07-14 NOTE — Progress Notes (Signed)
[facility]  ROUNDING NOTE   Subjective:   Jonathan Mcgrath is a 81 y.o.male with past medical history of hypertension, diabetes, and end stage renal disease on hemodialysis. Patient presents to the ED from dialysis center after reportedly having a syncopal episode. He has been admitted for Slurred speech [R47.81] Acute CVA (cerebrovascular accident) [facility]) [I63.9] Cerebrovascular accident (CVA), unspecified mechanism ([place]) [I63.9]  Patient is known to our practice and receives outpatient dialysis treatments at [facility] on a MWF schedule, supervised by Dr. Holley Raring.    Patient seen and evaluated during dialysis   HEMODIALYSIS FLOWSHEET:  Blood Flow Rate (mL/min): 400 mL/min Arterial Pressure (mmHg): -230 mmHg Venous Pressure (mmHg): 270 mmHg TMP (mmHg): -1 mmHg Ultrafiltration Rate (mL/min): 543 mL/min Dialysate Flow Rate (mL/min): 300 ml/min Dialysis Fluid Bolus: Normal Saline  Tolerating treatment well. Dry weight adjusted upward to reduce fluid pull   Objective:  Vital signs in last 24 hours:  Temp:  [98 F (36.7 C)-98.4 F (36.9 C)] 98.1 F (36.7 C) (12/29 0830) Pulse Rate:  [44-88] 66 (12/29 1030) Resp:  [13-21] 19 (12/29 1030) BP: (142-191)/(60-89) 144/88 (12/29 1030) SpO2:  [92 %-98 %] 95 % (12/29 1030) Weight:  [90.2 kg] 90.2 kg (12/29 0830)  Weight change:  Filed Weights   07/13/22 0941 07/14/22 0830  Weight: 92.2 kg 90.2 kg    Intake/Output: No intake/output data recorded.   Intake/Output this shift:  No intake/output data recorded.  Physical Exam: General: NAD, resting comfortably  Head: Normocephalic, atraumatic. Moist oral mucosal membranes  Eyes: Anicteric  Lungs:  Clear to auscultation, normal effort, room air  Heart: Regular rate and rhythm  Abdomen:  Soft, nontender  Extremities: No peripheral edema.  Neurologic: Nonfocal, moving all four extremities  Skin: No lesions  Access: Left upper aVF    Basic Metabolic  Panel: Recent Labs  Lab 07/09/22 1226 07/12/22 1041 07/12/22 2358 07/13/22 0918 07/14/22 0620  NA 141 142  --  141 142  K 3.7 3.7  --  4.3 4.6  CL 101 101  --  102 104  CO2 29 30  --  28 26  GLUCOSE 95 81  --  113* 76  BUN 20 26*  --  37* 55*  CREATININE 4.05* 4.98* 6.77* 7.21* 8.94*  CALCIUM 9.1 8.6*  --  8.9 8.9  MG  --   --   --  2.1 2.3  PHOS  --   --   --  6.8* 7.8*     Liver Function Tests: Recent Labs  Lab 07/09/22 1226 07/13/22 0918 07/14/22 0620  AST '17 18 17  '$ ALT '14 14 14  '$ ALKPHOS 85 78 75  BILITOT 1.1 0.8 1.0  PROT 6.6 5.9* 5.9*  ALBUMIN 3.8 3.4* 3.3*    No results for input(s): "LIPASE", "AMYLASE" in the last 168 hours. No results for input(s): "AMMONIA" in the last 168 hours.  CBC: Recent Labs  Lab 07/09/22 1226 07/12/22 1041 07/12/22 2358 07/13/22 0918 07/14/22 0620  WBC 15.1* 14.3* 15.2* 14.1* 14.2*  NEUTROABS 2.1  --   --  1.8 2.0  HGB 13.4 13.2 12.7* 13.5 13.5  HCT 40.3 39.5 38.9* 40.7 40.2  MCV 92.4 92.1 93.3 92.3 92.0  PLT 102* 111* 111* 108* 101*     Cardiac Enzymes: No results for input(s): "CKTOTAL", "CKMB", "CKMBINDEX", "TROPONINI" in the last 168 hours.  BNP: Invalid input(s): "POCBNP"  CBG: Recent Labs  Lab 07/13/22 0831 07/13/22 1159 07/13/22 1650 07/13/22 2210 07/14/22 0735  GLUCAP 75 87 121* 81 82     Microbiology: Results for orders placed or performed during the hospital encounter of 07/12/22  Resp panel by RT-PCR (RSV, Flu A&B, Covid) Anterior Nasal Swab     Status: None   Collection Time: 07/12/22  4:31 PM   Specimen: Anterior Nasal Swab  Result Value Ref Range Status   SARS Coronavirus 2 by RT PCR NEGATIVE NEGATIVE Final    Comment: (NOTE) SARS-CoV-2 target nucleic acids are NOT DETECTED.  The SARS-CoV-2 RNA is generally detectable in upper respiratory specimens during the acute phase of infection. The lowest concentration of SARS-CoV-2 viral copies this assay can detect is 138 copies/mL. A negative  result does not preclude SARS-Cov-2 infection and should not be used as the sole basis for treatment or other patient management decisions. A negative result may occur with  improper specimen collection/handling, submission of specimen other than nasopharyngeal swab, presence of viral mutation(s) within the areas targeted by this assay, and inadequate number of viral copies(<138 copies/mL). A negative result must be combined with clinical observations, patient history, and epidemiological information. The expected result is Negative.  Fact Sheet for Patients:  EntrepreneurPulse.com.au  Fact Sheet for Healthcare Providers:  IncredibleEmployment.be  This test is no t yet approved or cleared by the [place] FDA and  has been authorized for detection and/or diagnosis of SARS-CoV-2 by FDA under an Emergency Use Authorization (EUA). This EUA will remain  in effect (meaning this test can be used) for the duration of the COVID-19 declaration under Section 564(b)(1) of the Act, 21 U.S.C.section 360bbb-3(b)(1), unless the authorization is terminated  or revoked sooner.       Influenza A by PCR NEGATIVE NEGATIVE Final   Influenza B by PCR NEGATIVE NEGATIVE Final    Comment: (NOTE) The Xpert Xpress SARS-CoV-2/FLU/RSV plus assay is intended as an aid in the diagnosis of influenza from Nasopharyngeal swab specimens and should not be used as a sole basis for treatment. Nasal washings and aspirates are unacceptable for Xpert Xpress SARS-CoV-2/FLU/RSV testing.  Fact Sheet for Patients: EntrepreneurPulse.com.au  Fact Sheet for Healthcare Providers: IncredibleEmployment.be  This test is not yet approved or cleared by the [place] FDA and has been authorized for detection and/or diagnosis of SARS-CoV-2 by FDA under an Emergency Use Authorization (EUA). This EUA will remain in effect (meaning this test can be used)  for the duration of the COVID-19 declaration under Section 564(b)(1) of the Act, 21 U.S.C. section 360bbb-3(b)(1), unless the authorization is terminated or revoked.     Resp Syncytial Virus by PCR NEGATIVE NEGATIVE Final    Comment: (NOTE) Fact Sheet for Patients: EntrepreneurPulse.com.au  Fact Sheet for Healthcare Providers: IncredibleEmployment.be  This test is not yet approved or cleared by the [place] FDA and has been authorized for detection and/or diagnosis of SARS-CoV-2 by FDA under an Emergency Use Authorization (EUA). This EUA will remain in effect (meaning this test can be used) for the duration of the COVID-19 declaration under Section 564(b)(1) of the Act, 21 U.S.C. section 360bbb-3(b)(1), unless the authorization is terminated or revoked.  Performed at [facility], [address] [place], [place] [address]     Coagulation Studies: No results for input(s): "LABPROT", "INR" in the last 72 hours.  Urinalysis: No results for input(s): "COLORURINE", "LABSPEC", "PHURINE", "GLUCOSEU", "HGBUR", "BILIRUBINUR", "KETONESUR", "PROTEINUR", "UROBILINOGEN", "NITRITE", "LEUKOCYTESUR" in the last 72 hours.  Invalid input(s): "APPERANCEUR"    Imaging: ECHOCARDIOGRAM COMPLETE  Result Date: 07/13/2022    ECHOCARDIOGRAM REPORT  Patient Name:   Jonathan Mcgrath Date of Exam: 07/13/2022 Medical Rec #:  [id number]           Height:       73.0 in Accession #:    [id number]          Weight:       203.3 lb Date of Birth:  1940/11/07            BSA:          2.166 m Patient Age:    16 years            BP:           170/71 mmHg Patient Gender: M                   HR:           62 bpm. Exam Location:  [facility] Procedure: 2D Echo, Cardiac Doppler and Color Doppler Indications:     Stroke  History:         Patient has prior history of Echocardiogram examinations, most                  recent 06/17/2018. Stroke; Risk Factors:Hypertension and                   Diabetes. Colon CA, ESRD on Dialysis.  Sonographer:     Wenda Low Referring Phys:  [id number] Athena Masse Diagnosing Phys: Kathlyn Sacramento MD IMPRESSIONS  1. Left ventricular ejection fraction, by estimation, is 55 to 60%. The left ventricle has normal function. The left ventricle has no regional wall motion abnormalities. There is moderate left ventricular hypertrophy. Left ventricular diastolic parameters are consistent with Grade II diastolic dysfunction (pseudonormalization).  2. Right ventricular systolic function is normal. The right ventricular size is normal. There is moderately elevated pulmonary artery systolic pressure.  3. Left atrial size was moderately dilated.  4. The mitral valve is normal in structure. Mild mitral valve regurgitation. No evidence of mitral stenosis.  5. The aortic valve is calcified. Aortic valve regurgitation is not visualized. Moderate aortic valve stenosis. Aortic valve area, by VTI measures 1.22 cm. Aortic valve mean gradient measures 22.0 mmHg. FINDINGS  Left Ventricle: Left ventricular ejection fraction, by estimation, is 55 to 60%. The left ventricle has normal function. The left ventricle has no regional wall motion abnormalities. The left ventricular internal cavity size was normal in size. There is  moderate left ventricular hypertrophy. Left ventricular diastolic parameters are consistent with Grade II diastolic dysfunction (pseudonormalization). Right Ventricle: The right ventricular size is normal. No increase in right ventricular wall thickness. Right ventricular systolic function is normal. There is moderately elevated pulmonary artery systolic pressure. The tricuspid regurgitant velocity is 3.11 m/s, and with an assumed right atrial pressure of 8 mmHg, the estimated right ventricular systolic pressure is 52.8 mmHg. Left Atrium: Left atrial size was moderately dilated. Right Atrium: Right atrial size was normal in size. Pericardium: Trivial  pericardial effusion is present. Mitral Valve: The mitral valve is normal in structure. Mild mitral valve regurgitation. No evidence of mitral valve stenosis. MV peak gradient, 4.8 mmHg. The mean mitral valve gradient is 2.0 mmHg. Tricuspid Valve: The tricuspid valve is normal in structure. Tricuspid valve regurgitation is mild . No evidence of tricuspid stenosis. Aortic Valve: The aortic valve is calcified. Aortic valve regurgitation is not visualized. Moderate aortic stenosis is present. Aortic valve mean gradient measures 22.0 mmHg. Aortic valve peak gradient measures  41.3 mmHg. Aortic valve area, by VTI measures 1.22 cm. Pulmonic Valve: The pulmonic valve was normal in structure. Pulmonic valve regurgitation is mild. No evidence of pulmonic stenosis. Aorta: The aortic root is normal in size and structure. Venous: The inferior vena cava was not well visualized. IAS/Shunts: No atrial level shunt detected by color flow Doppler.  LEFT VENTRICLE PLAX 2D LVIDd:         4.80 cm   Diastology LVIDs:         4.18 cm   LV e' medial:    4.57 cm/s LV PW:         1.05 cm   LV E/e' medial:  21.8 LV IVS:        1.80 cm   LV e' lateral:   6.20 cm/s LVOT diam:     2.00 cm   LV E/e' lateral: 16.0 LV SV:         98 LV SV Index:   45 LVOT Area:     3.14 cm  RIGHT VENTRICLE RV Basal diam:  3.60 cm RV Mid diam:    3.20 cm RV S prime:     13.10 cm/s TAPSE (M-mode): 3.0 cm LEFT ATRIUM             Index        RIGHT ATRIUM           Index LA diam:        5.40 cm 2.49 cm/m   RA Area:     18.90 cm LA Vol (A2C):   85.5 ml 39.46 ml/m  RA Volume:   47.80 ml  22.06 ml/m LA Vol (A4C):   87.8 ml 40.53 ml/m LA Biplane Vol: 89.9 ml 41.50 ml/m  AORTIC VALVE                     PULMONIC VALVE AV Area (Vmax):    1.28 cm      PV Vmax:       1.46 m/s AV Area (Vmean):   1.17 cm      PV Peak grad:  8.5 mmHg AV Area (VTI):     1.22 cm AV Vmax:           321.50 cm/s AV Vmean:          216.000 cm/s AV VTI:            0.804 m AV Peak Grad:       41.3 mmHg AV Mean Grad:      22.0 mmHg LVOT Vmax:         131.00 cm/s LVOT Vmean:        80.250 cm/s LVOT VTI:          0.311 m LVOT/AV VTI ratio: 0.39  AORTA Ao Root diam: 3.20 cm Ao Asc diam:  2.90 cm MITRAL VALVE                TRICUSPID VALVE MV Area (PHT): 2.72 cm     TR Peak grad:   38.7 mmHg MV Area VTI:   2.80 cm     TR Vmax:        311.00 cm/s MV Peak grad:  4.8 mmHg MV Mean grad:  2.0 mmHg     SHUNTS MV Vmax:       1.09 m/s     Systemic VTI:  0.31 m MV Vmean:      64.1 cm/s    Systemic Diam: 2.00 cm MV  Decel Time: 279 msec MV E velocity: 99.50 cm/s MV A velocity: 102.00 cm/s MV E/A ratio:  0.98 Kathlyn Sacramento MD Electronically signed by Kathlyn Sacramento MD Signature Date/Time: 07/13/2022/1:07:02 PM    Final    MR BRAIN WO CONTRAST  Result Date: 07/12/2022 CLINICAL DATA:  Initial evaluation for acute TIA, near syncope EXAM: MRI HEAD WITHOUT CONTRAST TECHNIQUE: Multiplanar, multiecho pulse sequences of the brain and surrounding structures were obtained without intravenous contrast. COMPARISON:  Prior CT from earlier the same day as well as recent MRI from 07/09/2022. FINDINGS: Brain: Mild age-related cerebral atrophy. Patchy and confluent T2/FLAIR hyperintensity involving the periventricular deep white matter both cerebral hemispheres, consistent with chronic small vessel ischemic disease. Few scatter remote lacunar infarcts present about the hemispheric cerebral white matter, deep gray nuclei, and pons. Few scattered small remote bilateral cerebellar infarcts. Subtle 1.5 cm focus of linear restricted diffusion seen involving the right paramedian pons (series 5, image 14), consistent with a small acute ischemic infarct. No associated hemorrhage or mass effect. No other evidence for acute or interval infarction. Gray-white matter differentiation otherwise maintained. No other areas of chronic cortical infarction. No acute or chronic intracranial blood products. No mass lesion, midline shift or mass  effect. No hydrocephalus or extra-axial fluid collection. Pituitary gland and suprasellar region within normal limits. Vascular: Major intracranial vascular flow voids are maintained. Skull and upper cervical spine: Craniocervical junction within normal limits. Bone marrow signal intensity mildly heterogeneous but overall within normal limits. No acute scalp soft tissue abnormality. Small lipoma noted at the left suboccipital region. Sinuses/Orbits: Prior bilateral ocular lens replacement. Scattered mucosal thickening noted about the ethmoidal air cells and maxillary sinuses. Moderate right mastoid effusion. Visualized nasopharynx unremarkable. Other: None. IMPRESSION: 1. 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 2. Underlying age-related cerebral atrophy with chronic small vessel ischemic disease, with several scattered remote lacunar infarcts involving the hemispheric cerebral white matter, deep gray nuclei, and pons. Few scattered remote bilateral cerebellar infarcts. Electronically Signed   By: Jeannine Boga M.D.   On: 07/12/2022 22:33   DG Chest Portable 1 View  Result Date: 07/12/2022 CLINICAL DATA:  Shortness of breath. Near syncope during dialysis. Slurred speech. EXAM: PORTABLE CHEST 1 VIEW COMPARISON:  02/15/2021 FINDINGS: The patient is rotated to the right on today's radiograph, reducing diagnostic sensitivity and specificity. Mild enlargement of the cardiopericardial silhouette noted. Atherosclerotic calcification of the aortic arch. Mild prominence of upper zone lung markings suggesting cephalization of blood flow and pulmonary venous hypertension. No overt pulmonary edema. No blunting of the costophrenic angles. Left subclavian/axillary stent noted. IMPRESSION: 1. Mild enlargement of the cardiopericardial silhouette with cephalization of blood flow and pulmonary venous hypertension but no overt edema. 2. Left subclavian/axillary stent noted. 3. Atherosclerotic  calcification of the aortic arch. Aortic Atherosclerosis (ICD10-I70.0). Electronically Signed   By: Van Clines M.D.   On: 07/12/2022 17:25     Medications:    anticoagulant sodium citrate      aspirin EC  81 mg Oral Daily   atorvastatin  40 mg Oral Daily   Chlorhexidine Gluconate Cloth  6 each Topical Q0600   clopidogrel  75 mg Oral Daily   heparin  5,000 Units Subcutaneous Q8H   insulin aspart  0-5 Units Subcutaneous QHS   insulin aspart  0-6 Units Subcutaneous TID WC   acetaminophen **OR** acetaminophen (TYLENOL) oral liquid 160 mg/5 mL **OR** acetaminophen, alteplase, anticoagulant sodium citrate, heparin, lidocaine (PF), lidocaine-prilocaine, pentafluoroprop-tetrafluoroeth  Assessment/ Plan:  Mr. Maxtyn  L Summons is a 81 y.o.  male with past medical history of hypertension, diabetes, and end stage renal disease on hemodialysis. Patient presents to the ED from dialysis center after reportedly having a syncopal episode. He has been admitted for Slurred speech [R47.81] Acute CVA (cerebrovascular accident) [facility]) [I63.9] Cerebrovascular accident (CVA), unspecified mechanism ([place]) [I63.9]  [facility] [place]/MWF/left aVF  End-stage renal disease on hemodialysis.  Will maintain outpatient schedule if possible.  Receiving scheduled dialysis treatment today, UF goal 0.5 to 1 L as tolerated.  Outpatient clinic has been notified of dry weight adjustment to 91 kg.  Next treatment scheduled for Sunday due to holiday schedule.  If stable patient cleared to discharge from renal stance after treatment today.  2. Anemia of chronic kidney disease Lab Results  Component Value Date   HGB 13.5 07/14/2022    Hemoglobin acceptable.  No need for ESA's at this time.  3. Secondary Hyperparathyroidism: with outpatient labs: PTH 953, phosphorus 6.4, calcium 9.4 on 07/05/2022  Lab Results  Component Value Date   PTH 141 (H) 08/18/2016   CALCIUM 8.9 07/14/2022   PHOS 7.8 (H) 07/14/2022     Currently prescribed Cinacalcet  and Auryxia outpatient.  Will continue to monitor bone minerals during this admission.  Phosphorus remains elevated.  4.  Hypertension with chronic kidney disease.  Outpatient regimen includes amlodipine.  Currently held.  Blood pressure 158/69 during dialysis.  5.  Acute CVA seen on MRI brain.  Outside of tPA window with improving symptoms.  Neurology consulted.  Absence of residual symptoms on assessment today.   LOS: 2 [place] 12/29/202311:00 AM

## 2022-07-14 NOTE — Progress Notes (Signed)
PROGRESS NOTE    Jonathan Mcgrath  OXB:[id number] DOB: 05-10-1941 DOA: 07/12/2022 PCP: Pcp, No   Brief Narrative:  HPI per Dr. Judd Gaudier on 07/12/22 Jonathan Mcgrath is a 81 y.o. male with medical history significant for hypertension, end-stage renal disease on hemodialysis who presents to the ED from dialysis for evaluation of slurred speech and near syncope that he developed near the end of his session.  Patient had a similar episode on 12/20 for which she was evaluated in the ED and discharged after reassuring workup.  He denies headache, visual disturbance, one-sided weakness numbness or tingling.  His symptoms had improved by arrival.  Patient was out of tPA window by assessment and the ED. ED course and data review: BP 183/71 with otherwise normal vitals.  WBC 14,000, troponin 53-53, respiratory viral panel negative for COVID flu and RSV.  EKG, personally viewed and interpreted showing sinus at 67 with first-degree AV block and nonspecific ST-T wave changes.  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 By the time of stroke diagnosis, patient was several hours out of tPA window having waited in the ED for over 10 hours.    **Interim History Patient is currently undergoing stroke workup and nephrology was consulted for maintenance of dialysis and plans for dialysis tomorrow. Neurology recommending MRA and Carotid Dopplers as well as PT/OT Evaluation which is still pending.    Assessment and Plan: Acute CVA (cerebrovascular accident) ([place]) Presyncope -Head CT done and showed "No acute intracranial abnormality. Stable non contrast CT appearance of advanced small vessel disease. 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 Underlying age-related cerebral atrophy with chronic small  vessel ischemic disease, with several scattered remote lacunar infarcts involving the hemispheric cerebral white matter, deep gray nuclei, and pons. Few scattered remote bilateral cerebellar infarcts." -Outside of tPA window by several hours and with improving symptoms -Permissive hypertension for first 24-48 hrs post stroke onset: Prn Labetalol IV or Vasotec IV If BP greater than 220/120  -Statins for LDL goal less than 70; currently his lipid panel shows a total cholesterol/HDL ratio 4.4, cholesterol level 197, HDL 45, LDL 134, triglycerides of 91 and VLDL of 18 -Check HbA1c and is now 5.8 -Initiated on ASA 325 mg daily and will defer to neurology to initiate 43m daily; also started on Plavix [id number] x 3 weeks then monotherapy thereafter -Neurology now recommending MRA of the Head and a Carotid U/S which is still pending  -Continue with telemetry, echocardiogram -ECHO done and showed EF of 55-60% with Moderate LVH and Grade 2 DD -Avoid dextrose containing fluids, Maintain euglycemia, euthermia -Neuro checks q4 hrs x 24 hrs and then per shift -Head of bed 30 degrees -Physical therapy/Occupational therapy/Speech therapy to evaluate and treat and PT/OT pending but SLP feels that all ST goals met and is appropriate for discharge from SLP services -Neurology consulted and appreciate further evaluation and recommendations   [facility] -Nephrology consulted for continuation of dialysis -BUN/Cr Trend: Recent Labs  Lab 07/09/22 1226 07/12/22 1041 07/12/22 2358 07/13/22 0918 07/14/22 0620  BUN 20 26*  --  37* 55*  CREATININE 4.05* 4.98* 6.77* 7.21* 8.94*  -Avoid Nephrotoxic Medications, Contrast Dyes, Hypotension and Dehydration to Ensure Adequate Renal Perfusion and will need to Renally Adjust Meds -Patient's Phos Level is now 6.8 -Continue to Monitor and  Trend Renal Function carefully and repeat CMP in the AM    Type 2 diabetes mellitus with stage 5  chronic kidney disease ([place]) -Continue with sliding scale insulin -CBGs ranging from 87-172   Essential Hypertension -Hold antihypertensives to allow for permissive hypertension -Continue monitor blood pressures per protocol -Last blood pressure reading was elevated at 175/89   Colon cancer (HCC) -No acute issues suspected   Leukocytosis -Likely reactive -Patient's WBC went from 14.3 -> 15.2 -> 14.1 -> 14.2 -Continue to Monitor and Trend and repeat CBC in the AM    Thrombocytopenia -Patient's Platelet Count went from 111 -> 111 -> 108 -> 101 -Continue to Monitor for S/Sx of Bleeding; No overt bleeding noted -Repeat CBC in the AM    Hypoalbuminemia  -Patient's Albumin Level is now 3.4 -> 3.3 -Continue to Monitor and Trend -Repeat CMP in the AM    DVT prophylaxis: heparin injection 5,000 Units Start: 07/12/22 2330    Code Status: Full Code Family Communication: Discussed with Daughter over the telephone  Disposition Plan:  Level of care: Progressive Status is: Inpatient Remains inpatient appropriate because: Awaiting full stroke workup   Consultants:  Neurology  Procedures:  As delineated as above  ECHOCARDIOGRAM IMPRESSIONS     1. Left ventricular ejection fraction, by estimation, is 55 to 60%. The  left ventricle has normal function. The left ventricle has no regional  wall motion abnormalities. There is moderate left ventricular hypertrophy.  Left ventricular diastolic  parameters are consistent with Grade II diastolic dysfunction  (pseudonormalization).   2. Right ventricular systolic function is normal. The right ventricular  size is normal. There is moderately elevated pulmonary artery systolic  pressure.   3. Left atrial size was moderately dilated.   4. The mitral valve is normal in structure. Mild mitral valve  regurgitation. No evidence of mitral stenosis.   5. The aortic valve is calcified. Aortic valve regurgitation is not  visualized. Moderate  aortic valve stenosis. Aortic valve area, by VTI  measures 1.22 cm. Aortic valve mean gradient measures 22.0 mmHg.   FINDINGS   Left Ventricle: Left ventricular ejection fraction, by estimation, is 55  to 60%. The left ventricle has normal function. The left ventricle has no  regional wall motion abnormalities. The left ventricular internal cavity  size was normal in size. There is   moderate left ventricular hypertrophy. Left ventricular diastolic  parameters are consistent with Grade II diastolic dysfunction  (pseudonormalization).   Right Ventricle: The right ventricular size is normal. No increase in  right ventricular wall thickness. Right ventricular systolic function is  normal. There is moderately elevated pulmonary artery systolic pressure.  The tricuspid regurgitant velocity is  3.11 m/s, and with an assumed right atrial pressure of 8 mmHg, the  estimated right ventricular systolic pressure is 65.9 mmHg.   Left Atrium: Left atrial size was moderately dilated.   Right Atrium: Right atrial size was normal in size.   Pericardium: Trivial pericardial effusion is present.   Mitral Valve: The mitral valve is normal in structure. Mild mitral valve  regurgitation. No evidence of mitral valve stenosis. MV peak gradient, 4.8  mmHg. The mean mitral valve gradient is 2.0 mmHg.   Tricuspid Valve: The tricuspid valve is normal in structure. Tricuspid  valve regurgitation is mild . No evidence of tricuspid stenosis.   Aortic Valve: The aortic valve is calcified. Aortic valve regurgitation is  not visualized. Moderate aortic stenosis is present. Aortic valve mean  gradient measures 22.0 mmHg.  Aortic valve peak gradient measures 41.3  mmHg. Aortic valve area, by VTI  measures 1.22 cm.   Pulmonic Valve: The pulmonic valve was normal in structure. Pulmonic valve  regurgitation is mild. No evidence of pulmonic stenosis.   Aorta: The aortic root is normal in size and structure.    Venous: The inferior vena cava was not well visualized.   IAS/Shunts: No atrial level shunt detected by color flow Doppler.     LEFT VENTRICLE  PLAX 2D  LVIDd:         4.80 cm   Diastology  LVIDs:         4.18 cm   LV e' medial:    4.57 cm/s  LV PW:         1.05 cm   LV E/e' medial:  21.8  LV IVS:        1.80 cm   LV e' lateral:   6.20 cm/s  LVOT diam:     2.00 cm   LV E/e' lateral: 16.0  LV SV:         98  LV SV Index:   45  LVOT Area:     3.14 cm     RIGHT VENTRICLE  RV Basal diam:  3.60 cm  RV Mid diam:    3.20 cm  RV S prime:     13.10 cm/s  TAPSE (M-mode): 3.0 cm   LEFT ATRIUM             Index        RIGHT ATRIUM           Index  LA diam:        5.40 cm 2.49 cm/m   RA Area:     18.90 cm  LA Vol (A2C):   85.5 ml 39.46 ml/m  RA Volume:   47.80 ml  22.06 ml/m  LA Vol (A4C):   87.8 ml 40.53 ml/m  LA Biplane Vol: 89.9 ml 41.50 ml/m   AORTIC VALVE                     PULMONIC VALVE  AV Area (Vmax):    1.28 cm      PV Vmax:       1.46 m/s  AV Area (Vmean):   1.17 cm      PV Peak grad:  8.5 mmHg  AV Area (VTI):     1.22 cm  AV Vmax:           321.50 cm/s  AV Vmean:          216.000 cm/s  AV VTI:            0.804 m  AV Peak Grad:      41.3 mmHg  AV Mean Grad:      22.0 mmHg  LVOT Vmax:         131.00 cm/s  LVOT Vmean:        80.250 cm/s  LVOT VTI:          0.311 m  LVOT/AV VTI ratio: 0.39    AORTA  Ao Root diam: 3.20 cm  Ao Asc diam:  2.90 cm   MITRAL VALVE                TRICUSPID VALVE  MV Area (PHT): 2.72 cm     TR Peak grad:   38.7 mmHg  MV Area VTI:   2.80 cm     TR Vmax:  311.00 cm/s  MV Peak grad:  4.8 mmHg  MV Mean grad:  2.0 mmHg     SHUNTS  MV Vmax:       1.09 m/s     Systemic VTI:  0.31 m  MV Vmean:      64.1 cm/s    Systemic Diam: 2.00 cm  MV Decel Time: 279 msec  MV E velocity: 99.50 cm/s  MV A velocity: 102.00 cm/s  MV E/A ratio:  0.98   Antimicrobials:  Anti-infectives (From admission, onward)    None        Subjective: Seen and examined at bedside in the dialysis unit and has done fairly well.  Thinks his speech is doing better.  Concerned about his dialysis as it is removed Sunday.  No nausea or vomiting.  Stroke workup still pending and awaiting MRA to be done.  No other concerns or close at this time.  Objective: Vitals:   07/14/22 1130 07/14/22 1200 07/14/22 1217 07/14/22 1225  BP: (!) 152/66 135/68 (!) 145/69 (!) 182/82  Pulse: 67 66 69 65  Resp: _0 Temp:   97.7 F (36.5 C)   TempSrc:   Oral   SpO2: 96% 97% 96% 98%  Weight:      Height:        Intake/Output Summary (Last 24 hours) at 07/14/2022 1440 Last data filed at 07/14/2022 1225 Gross per 24 hour  Intake --  Output 1000 ml  Net -1000 ml   Filed Weights   07/13/22 0941 07/14/22 0830  Weight: 92.2 kg 90.2 kg   Examination: Physical Exam:  Constitutional: WN/WD overweight elderly African-American male in no acute distress appears calm Respiratory: Diminished to auscultation bilaterally, no wheezing, rales, rhonchi or crackles. Normal respiratory effort and patient is not tachypenic. No accessory muscle use.  Unlabored breathing Cardiovascular: RRR, no murmurs / rubs / gallops. S1 and S2 auscultated. No extremity edema. Abdomen: Soft, non-tender, slightly distended secondary body habitus. Bowel sounds positive.  GU: Deferred. Musculoskeletal: No clubbing / cyanosis of digits/nails. No joint deformity upper and lower extremities.  Has a left-sided arm AV fistula Skin: No rashes, lesions, ulcers on limited skin evaluation. No induration; Warm and dry.  Neurologic: CN 2-12 grossly intact with no focal deficits. Romberg sign and cerebellar reflexes not assessed.  Psychiatric: Normal judgment and insight. Alert and oriented x 3. Normal mood and appropriate affect.   Data Reviewed: I have personally reviewed following labs and imaging studies  CBC: Recent Labs  Lab 07/09/22 1226 07/12/22 1041 07/12/22 2358  07/13/22 0918 07/14/22 0620  WBC 15.1* 14.3* 15.2* 14.1* 14.2*  NEUTROABS 2.1  --   --  1.8 2.0  HGB 13.4 13.2 12.7* 13.5 13.5  HCT 40.3 39.5 38.9* 40.7 40.2  MCV 92.4 92.1 93.3 92.3 92.0  PLT 102* 111* 111* 108* 937*   Basic Metabolic Panel: Recent Labs  Lab 07/09/22 1226 07/12/22 1041 07/12/22 2358 07/13/22 0918 07/14/22 0620  NA 141 142  --  141 142  K 3.7 3.7  --  4.3 4.6  CL 101 101  --  102 104  CO2 29 30  --  28 26  GLUCOSE 95 81  --  113* 76  BUN 20 26*  --  37* 55*  CREATININE 4.05* 4.98* 6.77* 7.21* 8.94*  CALCIUM 9.1 8.6*  --  8.9 8.9  MG  --   --   --  2.1 2.3  PHOS  --   --   --  6.8* 7.8*   GFR: Estimated Creatinine Clearance: 7.3 mL/min (A) (by C-G formula based on SCr of 8.94 mg/dL (H)). Liver Function Tests: Recent Labs  Lab 07/09/22 1226 07/13/22 0918 07/14/22 0620  AST _0 ALT _1 ALKPHOS 85 78 75  BILITOT 1.1 0.8 1.0  PROT 6.6 5.9* 5.9*  ALBUMIN 3.8 3.4* 3.3*   No results for input(s): "LIPASE", "AMYLASE" in the last 168 hours. No results for input(s): "AMMONIA" in the last 168 hours. Coagulation Profile: Recent Labs  Lab 07/09/22 1226  INR 1.1   Cardiac Enzymes: No results for input(s): "CKTOTAL", "CKMB", "CKMBINDEX", "TROPONINI" in the last 168 hours. BNP (last 3 results) No results for input(s): "PROBNP" in the last 8760 hours. HbA1C: Recent Labs    07/12/22 2358  HGBA1C 5.8*   CBG: Recent Labs  Lab 07/13/22 1159 07/13/22 1650 07/13/22 2210 07/14/22 0735 07/14/22 1357  GLUCAP 87 121* 92 82 86   Lipid Profile: Recent Labs    07/12/22 2358  CHOL 197  HDL 45  LDLCALC 134*  TRIG 91  CHOLHDL 4.4   Thyroid Function Tests: No results for input(s): "TSH", "T4TOTAL", "FREET4", "T3FREE", "THYROIDAB" in the last 72 hours. Anemia Panel: No results for input(s): "VITAMINB12", "FOLATE", "FERRITIN", "TIBC", "IRON", "RETICCTPCT" in the last 72 hours. Sepsis Labs: No results for input(s): "PROCALCITON",  "LATICACIDVEN" in the last 168 hours.  Recent Results (from the past 240 hour(s))  Resp panel by RT-PCR (RSV, Flu A&B, Covid) Anterior Nasal Swab     Status: None   Collection Time: 07/12/22  4:31 PM   Specimen: Anterior Nasal Swab  Result Value Ref Range Status   SARS Coronavirus 2 by RT PCR NEGATIVE NEGATIVE Final    Comment: (NOTE) SARS-CoV-2 target nucleic acids are NOT DETECTED.  The SARS-CoV-2 RNA is generally detectable in upper respiratory specimens during the acute phase of infection. The lowest concentration of SARS-CoV-2 viral copies this assay can detect is 138 copies/mL. A negative result does not preclude SARS-Cov-2 infection and should not be used as the sole basis for treatment or other patient management decisions. A negative result may occur with  improper specimen collection/handling, submission of specimen other than nasopharyngeal swab, presence of viral mutation(s) within the areas targeted by this assay, and inadequate number of viral copies(<138 copies/mL). A negative result must be combined with clinical observations, patient history, and epidemiological information. The expected result is Negative.  Fact Sheet for Patients:  EntrepreneurPulse.com.au  Fact Sheet for Healthcare Providers:  IncredibleEmployment.be  This test is no t yet approved or cleared by the [place] FDA and  has been authorized for detection and/or diagnosis of SARS-CoV-2 by FDA under an Emergency Use Authorization (EUA). This EUA will remain  in effect (meaning this test can be used) for the duration of the COVID-19 declaration under Section 564(b)(1) of the Act, 21 U.S.C.section 360bbb-3(b)(1), unless the authorization is terminated  or revoked sooner.       Influenza A by PCR NEGATIVE NEGATIVE Final   Influenza B by PCR NEGATIVE NEGATIVE Final    Comment: (NOTE) The Xpert Xpress SARS-CoV-2/FLU/RSV plus assay is intended as an aid in  the diagnosis of influenza from Nasopharyngeal swab specimens and should not be used as a sole basis for treatment. Nasal washings and aspirates are unacceptable for Xpert Xpress SARS-CoV-2/FLU/RSV testing.  Fact Sheet for Patients: EntrepreneurPulse.com.au  Fact Sheet for Healthcare Providers: IncredibleEmployment.be  This test is not yet approved or cleared by the [place]  States FDA and has been authorized for detection and/or diagnosis of SARS-CoV-2 by FDA under an Emergency Use Authorization (EUA). This EUA will remain in effect (meaning this test can be used) for the duration of the COVID-19 declaration under Section 564(b)(1) of the Act, 21 U.S.C. section 360bbb-3(b)(1), unless the authorization is terminated or revoked.     Resp Syncytial Virus by PCR NEGATIVE NEGATIVE Final    Comment: (NOTE) Fact Sheet for Patients: EntrepreneurPulse.com.au  Fact Sheet for Healthcare Providers: IncredibleEmployment.be  This test is not yet approved or cleared by the [place] FDA and has been authorized for detection and/or diagnosis of SARS-CoV-2 by FDA under an Emergency Use Authorization (EUA). This EUA will remain in effect (meaning this test can be used) for the duration of the COVID-19 declaration under Section 564(b)(1) of the Act, 21 U.S.C. section 360bbb-3(b)(1), unless the authorization is terminated or revoked.  Performed at [facility], [address], [place], [place] [address]     Radiology Studies: ECHOCARDIOGRAM COMPLETE  Result Date: 07/13/2022    ECHOCARDIOGRAM REPORT   Patient Name:   MILEN LENGACHER Date of Exam: 07/13/2022 Medical Rec #:  [id number]           Height:       73.0 in Accession #:    [id number]          Weight:       203.3 lb Date of Birth:  December 20, 1940            BSA:          2.166 m Patient Age:    5 years            BP:           170/71 mmHg Patient Gender: M                    HR:           62 bpm. Exam Location:  [facility] Procedure: 2D Echo, Cardiac Doppler and Color Doppler Indications:     Stroke  History:         Patient has prior history of Echocardiogram examinations, most                  recent 06/17/2018. Stroke; Risk Factors:Hypertension and                  Diabetes. Colon CA, ESRD on Dialysis.  Sonographer:     Wenda Low Referring Phys:  [id number] Athena Masse Diagnosing Phys: Kathlyn Sacramento MD IMPRESSIONS  1. Left ventricular ejection fraction, by estimation, is 55 to 60%. The left ventricle has normal function. The left ventricle has no regional wall motion abnormalities. There is moderate left ventricular hypertrophy. Left ventricular diastolic parameters are consistent with Grade II diastolic dysfunction (pseudonormalization).  2. Right ventricular systolic function is normal. The right ventricular size is normal. There is moderately elevated pulmonary artery systolic pressure.  3. Left atrial size was moderately dilated.  4. The mitral valve is normal in structure. Mild mitral valve regurgitation. No evidence of mitral stenosis.  5. The aortic valve is calcified. Aortic valve regurgitation is not visualized. Moderate aortic valve stenosis. Aortic valve area, by VTI measures 1.22 cm. Aortic valve mean gradient measures 22.0 mmHg. FINDINGS  Left Ventricle: Left ventricular ejection fraction, by estimation, is 55 to 60%. The left ventricle has normal function. The left ventricle has no regional wall motion abnormalities. The left ventricular internal cavity  size was normal in size. There is  moderate left ventricular hypertrophy. Left ventricular diastolic parameters are consistent with Grade II diastolic dysfunction (pseudonormalization). Right Ventricle: The right ventricular size is normal. No increase in right ventricular wall thickness. Right ventricular systolic function is normal. There is moderately elevated pulmonary artery systolic pressure.  The tricuspid regurgitant velocity is 3.11 m/s, and with an assumed right atrial pressure of 8 mmHg, the estimated right ventricular systolic pressure is 09.3 mmHg. Left Atrium: Left atrial size was moderately dilated. Right Atrium: Right atrial size was normal in size. Pericardium: Trivial pericardial effusion is present. Mitral Valve: The mitral valve is normal in structure. Mild mitral valve regurgitation. No evidence of mitral valve stenosis. MV peak gradient, 4.8 mmHg. The mean mitral valve gradient is 2.0 mmHg. Tricuspid Valve: The tricuspid valve is normal in structure. Tricuspid valve regurgitation is mild . No evidence of tricuspid stenosis. Aortic Valve: The aortic valve is calcified. Aortic valve regurgitation is not visualized. Moderate aortic stenosis is present. Aortic valve mean gradient measures 22.0 mmHg. Aortic valve peak gradient measures 41.3 mmHg. Aortic valve area, by VTI measures 1.22 cm. Pulmonic Valve: The pulmonic valve was normal in structure. Pulmonic valve regurgitation is mild. No evidence of pulmonic stenosis. Aorta: The aortic root is normal in size and structure. Venous: The inferior vena cava was not well visualized. IAS/Shunts: No atrial level shunt detected by color flow Doppler.  LEFT VENTRICLE PLAX 2D LVIDd:         4.80 cm   Diastology LVIDs:         4.18 cm   LV e' medial:    4.57 cm/s LV PW:         1.05 cm   LV E/e' medial:  21.8 LV IVS:        1.80 cm   LV e' lateral:   6.20 cm/s LVOT diam:     2.00 cm   LV E/e' lateral: 16.0 LV SV:         98 LV SV Index:   45 LVOT Area:     3.14 cm  RIGHT VENTRICLE RV Basal diam:  3.60 cm RV Mid diam:    3.20 cm RV S prime:     13.10 cm/s TAPSE (M-mode): 3.0 cm LEFT ATRIUM             Index        RIGHT ATRIUM           Index LA diam:        5.40 cm 2.49 cm/m   RA Area:     18.90 cm LA Vol (A2C):   85.5 ml 39.46 ml/m  RA Volume:   47.80 ml  22.06 ml/m LA Vol (A4C):   87.8 ml 40.53 ml/m LA Biplane Vol: 89.9 ml 41.50 ml/m  AORTIC  VALVE                     PULMONIC VALVE AV Area (Vmax):    1.28 cm      PV Vmax:       1.46 m/s AV Area (Vmean):   1.17 cm      PV Peak grad:  8.5 mmHg AV Area (VTI):     1.22 cm AV Vmax:           321.50 cm/s AV Vmean:          216.000 cm/s AV VTI:  0.804 m AV Peak Grad:      41.3 mmHg AV Mean Grad:      22.0 mmHg LVOT Vmax:         131.00 cm/s LVOT Vmean:        80.250 cm/s LVOT VTI:          0.311 m LVOT/AV VTI ratio: 0.39  AORTA Ao Root diam: 3.20 cm Ao Asc diam:  2.90 cm MITRAL VALVE                TRICUSPID VALVE MV Area (PHT): 2.72 cm     TR Peak grad:   38.7 mmHg MV Area VTI:   2.80 cm     TR Vmax:        311.00 cm/s MV Peak grad:  4.8 mmHg MV Mean grad:  2.0 mmHg     SHUNTS MV Vmax:       1.09 m/s     Systemic VTI:  0.31 m MV Vmean:      64.1 cm/s    Systemic Diam: 2.00 cm MV Decel Time: 279 msec MV E velocity: 99.50 cm/s MV A velocity: 102.00 cm/s MV E/A ratio:  0.98 Kathlyn Sacramento MD Electronically signed by Kathlyn Sacramento MD Signature Date/Time: 07/13/2022/1:07:02 PM    Final    MR BRAIN WO CONTRAST  Result Date: 07/12/2022 CLINICAL DATA:  Initial evaluation for acute TIA, near syncope EXAM: MRI HEAD WITHOUT CONTRAST TECHNIQUE: Multiplanar, multiecho pulse sequences of the brain and surrounding structures were obtained without intravenous contrast. COMPARISON:  Prior CT from earlier the same day as well as recent MRI from 07/09/2022. FINDINGS: Brain: Mild age-related cerebral atrophy. Patchy and confluent T2/FLAIR hyperintensity involving the periventricular deep white matter both cerebral hemispheres, consistent with chronic small vessel ischemic disease. Few scatter remote lacunar infarcts present about the hemispheric cerebral white matter, deep gray nuclei, and pons. Few scattered small remote bilateral cerebellar infarcts. Subtle 1.5 cm focus of linear restricted diffusion seen involving the right paramedian pons (series 5, image 14), consistent with a small acute ischemic  infarct. No associated hemorrhage or mass effect. No other evidence for acute or interval infarction. Gray-white matter differentiation otherwise maintained. No other areas of chronic cortical infarction. No acute or chronic intracranial blood products. No mass lesion, midline shift or mass effect. No hydrocephalus or extra-axial fluid collection. Pituitary gland and suprasellar region within normal limits. Vascular: Major intracranial vascular flow voids are maintained. Skull and upper cervical spine: Craniocervical junction within normal limits. Bone marrow signal intensity mildly heterogeneous but overall within normal limits. No acute scalp soft tissue abnormality. Small lipoma noted at the left suboccipital region. Sinuses/Orbits: Prior bilateral ocular lens replacement. Scattered mucosal thickening noted about the ethmoidal air cells and maxillary sinuses. Moderate right mastoid effusion. Visualized nasopharynx unremarkable. Other: None. IMPRESSION: 1. 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 2. Underlying age-related cerebral atrophy with chronic small vessel ischemic disease, with several scattered remote lacunar infarcts involving the hemispheric cerebral white matter, deep gray nuclei, and pons. Few scattered remote bilateral cerebellar infarcts. Electronically Signed   By: Jeannine Boga M.D.   On: 07/12/2022 22:33   DG Chest Portable 1 View  Result Date: 07/12/2022 CLINICAL DATA:  Shortness of breath. Near syncope during dialysis. Slurred speech. EXAM: PORTABLE CHEST 1 VIEW COMPARISON:  02/15/2021 FINDINGS: The patient is rotated to the right on today's radiograph, reducing diagnostic sensitivity and specificity. Mild enlargement of the cardiopericardial silhouette noted. Atherosclerotic calcification of the aortic arch.  Mild prominence of upper zone lung markings suggesting cephalization of blood flow and pulmonary venous hypertension. No overt pulmonary edema.  No blunting of the costophrenic angles. Left subclavian/axillary stent noted. IMPRESSION: 1. Mild enlargement of the cardiopericardial silhouette with cephalization of blood flow and pulmonary venous hypertension but no overt edema. 2. Left subclavian/axillary stent noted. 3. Atherosclerotic calcification of the aortic arch. Aortic Atherosclerosis (ICD10-I70.0). Electronically Signed   By: Van Clines M.D.   On: 07/12/2022 17:25    Scheduled Meds:  aspirin EC  81 mg Oral Daily   atorvastatin  40 mg Oral Daily   Chlorhexidine Gluconate Cloth  6 each Topical Q0600   clopidogrel  75 mg Oral Daily   heparin  5,000 Units Subcutaneous Q8H   insulin aspart  0-5 Units Subcutaneous QHS   insulin aspart  0-6 Units Subcutaneous TID WC   Continuous Infusions:  anticoagulant sodium citrate      LOS: 2 days   Raiford Noble, DO [facility] Available via Epic secure chat 7am-7pm After these hours, please refer to coverage provider listed on [email or web address] 07/14/2022, 2:40 PM

## 2022-07-14 NOTE — ED Notes (Signed)
Pt continues to refuse BG checks and insulin.  Pt states "I'm not diabetic, and every time they check me my blood sugar is good."  [facility] RN reported that she notified MD of this situation.

## 2022-07-14 NOTE — Progress Notes (Signed)
Speech Language Pathology Treatment: Cognitive-Linquistic  Patient Details Name: Jonathan Mcgrath MRN: [id number] DOB: 09-17-1940 Today's Date: 07/14/2022 Time: 8022-3361 SLP Time Calculation (min) (ACUTE ONLY): 15 min  Assessment / Plan / Recommendation Clinical Impression  Pt seen for ongoing dysarthria management. Pt seen for treatment session in ED hallway. Pt is unknown to this Probation officer but communicates complex information with >95% intelligibility. Pt states that he feels his dysarthria has resolved. Education provided to pt on s/s of potential stroke as well as information on possible Outpatient ST follow up should he feel he needs it.   At this time, pt has met all ST goals and is appropriate for discharge from services.    HPI HPI: Per H&P "Jonathan Mcgrath is a 81 y.o. male with medical history significant for hypertension, end-stage renal disease on hemodialysis who presents to the ED from dialysis for evaluation of slurred speech and near syncope that he developed near the end of his session.  Patient had a similar episode on 12/20 for which she was evaluated in the ED and discharged after reassuring workup.  He denies headache, visual disturbance, one-sided weakness numbness or tingling.  His symptoms had improved by arrival.  Patient was out of tPA window by assessment and the ED.  ED course and data review: BP 183/71 with otherwise normal vitals.  WBC 14,000, troponin 53-53, respiratory viral panel negative for COVID flu and RSV.  EKG, personally viewed and interpreted showing sinus at 67 with first-degree AV block and nonspecific ST-T wave changes.  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  By the time of stroke diagnosis, patient was several hours out of tPA window having waited in the ED for over 10 hours." MRI "1. 1.5 cm acute ischemic nonhemorrhagic right paramedian pontine  infarct.  2. Underlying age-related cerebral atrophy  with chronic small vessel  ischemic disease, with several scattered remote lacunar infarcts  involving the hemispheric cerebral white matter, deep gray nuclei,  and pons. Few scattered remote bilateral cerebellar infarcts."      SLP Plan  All goals met      Recommendations for follow up therapy are one component of a multi-disciplinary discharge planning process, led by the attending physician.  Recommendations may be updated based on patient status, additional functional criteria and insurance authorization.    Recommendations                   Follow Up Recommendations: No SLP follow up SLP Visit Diagnosis: Dysarthria and anarthria (R47.1) Plan: All goals met         Jonathan Mcgrath, M.S., CCC-SLP, Mining engineer Certified Brain Injury [place]  [place] Office [phone] Ascom [phone] Fax [phone]

## 2022-07-14 NOTE — Progress Notes (Signed)
PT did 3.5 hrs of HD, tolerated well with no issue  UF = 1000 ml    07/14/22 1225  Vitals  BP (!) 182/82  MAP (mmHg) 112  BP Location Right Arm  BP Method Automatic  Patient Position (if appropriate) Lying  Pulse Rate 65  Pulse Rate Source Monitor  ECG Heart Rate 64  Resp 16  Oxygen Therapy  SpO2 98 %  O2 Device Room Air  Patient Activity (if Appropriate) In bed  Pulse Oximetry Type Continuous  Post Treatment  Dialyzer Clearance Lightly streaked  Duration of HD Treatment -hour(s) 3.5 hour(s)  Hemodialysis Intake (mL) 0 mL  Liters Processed 84  Fluid Removed (mL) 1000 mL (mLs)  Tolerated HD Treatment Yes  Post-Hemodialysis Comments pt tolerated well. vitals/pt stable. no concerns/complaints from pt atm. RN to give report. awaiting transport.  AVG/AVF Arterial Site Held (minutes) 8 minutes  AVG/AVF Venous Site Held (minutes) 8 minutes  Note  Observations POST tx vitals  Fistula / Graft Left Upper arm Arteriovenous fistula  No Placement Date or Time found.   Placed prior to admission: Yes  Orientation: Left  Access Location: Upper arm  Access Type: Arteriovenous fistula  Site Condition No complications  Fistula / Graft Assessment Present;Thrill;Bruit  Status Deaccessed  Needle Size 15g  Drainage Description None

## 2022-07-14 NOTE — Progress Notes (Signed)
PT Cancellation Note  Patient Details Name: ISABEL FREESE MRN: [id number] DOB: Mar 21, 1941   Cancelled Treatment:    Reason Eval/Treat Not Completed: Patient at procedure or test/unavailable Off unit at HD. Will re-attempt at later date/time.   Dimitri Shakespeare A. Gilford Rile PT, DPT [facility] - Acute Rehabilitation Services  Alizee Maple A De Libman 07/14/2022, 8:46 AM

## 2022-07-14 NOTE — Evaluation (Signed)
Occupational Therapy Evaluation Patient Details Name: Jonathan Mcgrath MRN: [id number] DOB: [birth year] Today's Date: 07/14/2022   History of Present Illness 81 y/o male presented to ED on 07/12/22 for slurred speech and near syncope at HD. MRI showed acute R pontine infarct. PMH: HTN, ESRD on HD, hx of colon cancer, T2DM   Clinical Impression   Chart reviewed, pt greeted in ED hallway agreeable to OT evaluation. Significant external stimuli and pt is s/p dialysis today, therefore mild processing deficits noted. Pt is also reported HOH. PTA Pt is MOD I in ADL/IADL, but has assist for IADL as needed. Amb with spc, rollator PRN. Pt presents with deficits in BUE strength, endurance, activity tolerance affecting safe and optimal ADL completion. Pt mildly unsteady with SPC after initial stand, endorses fatigue following HD on this date. MOD A required for LB dressing, CGA for amb in hallway approx  20' with SPC. Recommend discharge with [place]. OT will continue to follow acutely.      Recommendations for follow up therapy are one component of a multi-disciplinary discharge planning process, led by the attending physician.  Recommendations may be updated based on patient status, additional functional criteria and insurance authorization.   Follow Up Recommendations  Home health OT (vs outpatient OT pending progress)     Assistance Recommended at Discharge Intermittent Supervision/Assistance  Patient can return home with the following A little help with walking and/or transfers;A little help with bathing/dressing/bathroom    Functional Status Assessment  Patient has had a recent decline in their functional status and demonstrates the ability to make significant improvements in function in a reasonable and predictable amount of time.  Equipment Recommendations  None recommended by OT    Recommendations for Other Services       Precautions / Restrictions Precautions Precautions:  Fall Restrictions Weight Bearing Restrictions: No      Mobility Bed Mobility Overal bed mobility: Modified Independent             General bed mobility comments: ED stretcher    Transfers Overall transfer level: Needs assistance Equipment used: Straight cane Transfers: Sit to/from Stand Sit to Stand: Min assist           General transfer comment: initial unsteadiness requiring MIN A      Balance Overall balance assessment: Needs assistance Sitting-balance support: Feet supported Sitting balance-Leahy Scale: Good     Standing balance support: Single extremity supported, During functional activity Standing balance-Leahy Scale: Fair                             ADL either performed or assessed with clinical judgement   ADL Overall ADL's : Needs assistance/impaired                                       General ADL Comments: MOD A for LB dressing (shoes), SET UP for grooming tasks seated on edge of bed, amb approx 20' with SPC with CGA     Vision Baseline Vision/History: 1 Wears glasses Patient Visual Report: No change from baseline Vision Assessment?: No apparent visual deficits Additional Comments: Will continue to assess     Perception     Praxis      Pertinent Vitals/Pain Pain Assessment Pain Assessment: No/denies pain     Hand Dominance     Extremity/Trunk Assessment Upper Extremity Assessment Upper Extremity  Assessment: Generalized weakness (mild generalized weakness throughout BUE, good grip strength, fair FMC/dexterity)   Lower Extremity Assessment Lower Extremity Assessment: Generalized weakness;LLE deficits/detail;RLE deficits/detail RLE Sensation: history of peripheral neuropathy LLE Sensation: history of peripheral neuropathy   Cervical / Trunk Assessment Cervical / Trunk Assessment: Normal   Communication Communication Communication: HOH   Cognition Arousal/Alertness: Awake/alert Behavior During  Therapy: WFL for tasks assessed/performed Overall Cognitive Status: No family/caregiver present to determine baseline cognitive functioning Area of Impairment: Problem solving                             Problem Solving: Slow processing General Comments: slightly increased time required for processing, also significantly increased external stimuli in ED hallway, will continue to assess     General Comments       Exercises     Shoulder Instructions      Home Living Family/patient expects to be discharged to:: Private residence Living Arrangements: Alone Available Help at Discharge: Family;Available PRN/intermittently Type of Home: Apartment Home Access: Level entry     Home Layout: One level     Bathroom Shower/Tub: Teacher, early years/pre: Standard     Home Equipment: Tub bench;Rollator (4 wheels);Cane - single point;Grab bars - tub/shower          Prior Functioning/Environment Prior Level of Function : Independent/Modified Independent;Driving             Mobility Comments: amb with SPC, has rollator for PRN use ADLs Comments: MOD I with ADL, typically MOD I with ADL per chart,endorse he has help from son and daughter if needed        OT Problem List: Decreased strength;Decreased activity tolerance;Decreased knowledge of use of DME or AE;Decreased safety awareness;Decreased cognition      OT Treatment/Interventions: Self-care/ADL training;Therapeutic exercise;Patient/family education;Therapeutic activities    OT Goals(Current goals can be found in the care plan section) Acute Rehab OT Goals Patient Stated Goal: go home OT Goal Formulation: With patient Time For Goal Achievement: 07/28/22 Potential to Achieve Goals: Good ADL Goals Pt Will Perform Grooming: with modified independence Pt Will Perform Lower Body Dressing: with modified independence Pt Will Transfer to Toilet: with modified independence;ambulating Pt Will Perform  Toileting - Clothing Manipulation and hygiene: with modified independence;sit to/from stand Pt/caregiver will Perform Home Exercise Program: Increased strength;Right Upper extremity;Left upper extremity  OT Frequency: Min 2X/week    Co-evaluation              AM-PAC OT "6 Clicks" Daily Activity     Outcome Measure Help from another person eating meals?: None Help from another person taking care of personal grooming?: None Help from another person toileting, which includes using toliet, bedpan, or urinal?: A Little Help from another person bathing (including washing, rinsing, drying)?: A Little Help from another person to put on and taking off regular upper body clothing?: None Help from another person to put on and taking off regular lower body clothing?: A Little 6 Click Score: 21   End of Session Equipment Utilized During Treatment: Other (comment) (SPC)  Activity Tolerance: Patient tolerated treatment well Patient left: in bed;with call bell/phone within reach  OT Visit Diagnosis: Unsteadiness on feet (R26.81)                Time: 0355-9741 OT Time Calculation (min): 9 min Charges:  OT General Charges $OT Visit: 1 Visit OT Evaluation $OT Eval Low Complexity: 1 Low  Vicente Males  Tonyia Marschall, OTD OTR/L  07/14/22, 2:54 PM

## 2022-07-14 NOTE — Progress Notes (Signed)
OT Cancellation Note  Patient Details Name: SYON TEWS MRN: [id number] DOB: [birth year]   Cancelled Treatment:    Reason Eval/Treat Not Completed: Other (comment) (Pt at HD, OT will reattempt as able)  Shanon Payor, OTD OTR/L  07/14/22, 10:09 AM

## 2022-07-15 DIAGNOSIS — E1122 Type 2 diabetes mellitus with diabetic chronic kidney disease: Secondary | ICD-10-CM | POA: Diagnosis not present

## 2022-07-15 DIAGNOSIS — C9110 Chronic lymphocytic leukemia of B-cell type not having achieved remission: Secondary | ICD-10-CM

## 2022-07-15 DIAGNOSIS — I639 Cerebral infarction, unspecified: Secondary | ICD-10-CM | POA: Diagnosis not present

## 2022-07-15 DIAGNOSIS — I10 Essential (primary) hypertension: Secondary | ICD-10-CM | POA: Diagnosis not present

## 2022-07-15 DIAGNOSIS — C182 Malignant neoplasm of ascending colon: Secondary | ICD-10-CM | POA: Diagnosis not present

## 2022-07-15 LAB — CBC WITH DIFFERENTIAL/PLATELET
Abs Immature Granulocytes: 0.02 10*3/uL (ref 0.00–0.07)
Basophils Absolute: 0 10*3/uL (ref 0.0–0.1)
Basophils Relative: 0 %
Eosinophils Absolute: 0.1 10*3/uL (ref 0.0–0.5)
Eosinophils Relative: 1 %
HCT: 38.8 % — ABNORMAL LOW (ref 39.0–52.0)
Hemoglobin: 13.2 g/dL (ref 13.0–17.0)
Immature Granulocytes: 0 %
Lymphocytes Relative: 82 %
Lymphs Abs: 12.6 10*3/uL — ABNORMAL HIGH (ref 0.7–4.0)
MCH: 30.8 pg (ref 26.0–34.0)
MCHC: 34 g/dL (ref 30.0–36.0)
MCV: 90.7 fL (ref 80.0–100.0)
Monocytes Absolute: 0.6 10*3/uL (ref 0.1–1.0)
Monocytes Relative: 4 %
Neutro Abs: 2 10*3/uL (ref 1.7–7.7)
Neutrophils Relative %: 13 %
Platelets: 100 10*3/uL — ABNORMAL LOW (ref 150–400)
RBC: 4.28 MIL/uL (ref 4.22–5.81)
RDW: 13.8 % (ref 11.5–15.5)
WBC: 15.3 10*3/uL — ABNORMAL HIGH (ref 4.0–10.5)
nRBC: 0.3 % — ABNORMAL HIGH (ref 0.0–0.2)

## 2022-07-15 LAB — COMPREHENSIVE METABOLIC PANEL
ALT: 15 U/L (ref 0–44)
AST: 21 U/L (ref 15–41)
Albumin: 3.3 g/dL — ABNORMAL LOW (ref 3.5–5.0)
Alkaline Phosphatase: 69 U/L (ref 38–126)
Anion gap: 13 (ref 5–15)
BUN: 44 mg/dL — ABNORMAL HIGH (ref 8–23)
CO2: 28 mmol/L (ref 22–32)
Calcium: 9.1 mg/dL (ref 8.9–10.3)
Chloride: 99 mmol/L (ref 98–111)
Creatinine, Ser: 6.76 mg/dL — ABNORMAL HIGH (ref 0.61–1.24)
GFR, Estimated: 8 mL/min — ABNORMAL LOW (ref >60)
Glucose, Bld: 71 mg/dL (ref 70–99)
Potassium: 4.2 mmol/L (ref 3.5–5.1)
Sodium: 140 mmol/L (ref 135–145)
Total Bilirubin: 0.9 mg/dL (ref 0.3–1.2)
Total Protein: 5.6 g/dL — ABNORMAL LOW (ref 6.5–8.1)

## 2022-07-15 LAB — HEPATITIS B SURFACE ANTIBODY, QUANTITATIVE: Hep B S AB Quant (Post): 121.5 m[IU]/mL (ref >9.9)

## 2022-07-15 LAB — GLUCOSE, CAPILLARY
Glucose-Capillary: 67 mg/dL — ABNORMAL LOW (ref 70–99)
Glucose-Capillary: 91 mg/dL (ref 70–99)
Glucose-Capillary: 101 mg/dL — ABNORMAL HIGH (ref 70–99)

## 2022-07-15 LAB — PHOSPHORUS: Phosphorus: 5.9 mg/dL — ABNORMAL HIGH (ref 2.5–4.6)

## 2022-07-15 LAB — MAGNESIUM: Magnesium: 2 mg/dL (ref 1.7–2.4)

## 2022-07-15 MED ORDER — ACETAMINOPHEN 325 MG PO TABS: 650.0000 mg | ORAL_TABLET | ORAL | 0 refills | Status: DC | PRN

## 2022-07-15 MED ORDER — ATORVASTATIN CALCIUM 40 MG PO TABS: 40.0000 mg | ORAL_TABLET | Freq: Every day | ORAL | 0 refills | Status: DC

## 2022-07-15 MED ORDER — ASPIRIN 81 MG PO TBEC: 81.0000 mg | DELAYED_RELEASE_TABLET | Freq: Every day | ORAL | 12 refills | Status: DC

## 2022-07-15 MED ORDER — CLOPIDOGREL BISULFATE 75 MG PO TABS: 75.0000 mg | ORAL_TABLET | Freq: Every day | ORAL | 0 refills | Status: AC

## 2022-07-15 MED ORDER — CLOPIDOGREL BISULFATE 75 MG PO TABS: 75.0000 mg | ORAL_TABLET | Freq: Every day | ORAL | 0 refills | Status: DC

## 2022-07-15 MED ORDER — ATORVASTATIN CALCIUM 40 MG PO TABS: 40.0000 mg | ORAL_TABLET | Freq: Every day | ORAL | 1 refills | Status: DC

## 2022-07-15 NOTE — Plan of Care (Signed)
  Problem: Coping: Goal: Ability to adjust to condition or change in health will improve Outcome: Progressing   Problem: Metabolic: Goal: Ability to maintain appropriate glucose levels will improve Outcome: Progressing   Problem: Skin Integrity: Goal: Risk for impaired skin integrity will decrease Outcome: Progressing

## 2022-07-15 NOTE — Progress Notes (Signed)
[facility]  ROUNDING NOTE   Subjective:   Jonathan Mcgrath is a 81 y.o.male with past medical history of hypertension, diabetes, and end stage renal disease on hemodialysis. Patient presents to the ED from dialysis center after reportedly having a syncopal episode. He has been admitted for Slurred speech [R47.81] Acute CVA (cerebrovascular accident) [facility]) [I63.9] Cerebrovascular accident (CVA), unspecified mechanism ([place]) [I63.9]  Patient is known to our practice and receives outpatient dialysis treatments at [facility] on a MWF schedule, supervised by Dr. Holley Raring.    Patient seen resting quietly in bed, alert and oriented Neuro assessment negative Tolerated treatment well yesterday, denies lightheaded or dizziness after dialysis.  Objective:  Vital signs in last 24 hours:  Temp:  [97.5 F (36.4 C)-98.2 F (36.8 C)] 97.9 F (36.6 C) (12/30 1159) Pulse Rate:  [64-74] 74 (12/30 1159) Resp:  [16-18] 18 (12/30 1159) BP: (141-184)/(67-82) 167/77 (12/30 1159) SpO2:  [95 %-98 %] 96 % (12/30 1159) Weight:  [88.8 kg] 88.8 kg (12/30 0022)  Weight change: -2 kg Filed Weights   07/13/22 0941 07/14/22 0830 07/15/22 0022  Weight: 92.2 kg 90.2 kg 88.8 kg    Intake/Output: I/O last 3 completed shifts: In: -  Out: 1000 [Other:1000]   Intake/Output this shift:  Total I/O In: 240 [P.O.:240] Out: -   Physical Exam: General: NAD, resting comfortably  Head: Normocephalic, atraumatic. Moist oral mucosal membranes  Eyes: Anicteric  Lungs:  Clear to auscultation, normal effort, room air  Heart: Regular rate and rhythm  Abdomen:  Soft, nontender  Extremities: No peripheral edema.  Neurologic: Nonfocal, moving all four extremities  Skin: No lesions  Access: Left upper aVF    Basic Metabolic Panel: Recent Labs  Lab 07/09/22 1226 07/12/22 1041 07/12/22 2358 07/13/22 0918 07/14/22 0620 07/15/22 0451  NA 141 142  --  141 142 140  K 3.7 3.7  --  4.3 4.6 4.2   CL 101 101  --  102 104 99  CO2 29 30  --  '28 26 28  '$ GLUCOSE 95 81  --  113* 76 71  BUN 20 26*  --  37* 55* 44*  CREATININE 4.05* 4.98* 6.77* 7.21* 8.94* 6.76*  CALCIUM 9.1 8.6*  --  8.9 8.9 9.1  MG  --   --   --  2.1 2.3 2.0  PHOS  --   --   --  6.8* 7.8* 5.9*     Liver Function Tests: Recent Labs  Lab 07/09/22 1226 07/13/22 0918 07/14/22 0620 07/15/22 0451  AST '17 18 17 21  '$ ALT '14 14 14 15  '$ ALKPHOS 85 78 75 69  BILITOT 1.1 0.8 1.0 0.9  PROT 6.6 5.9* 5.9* 5.6*  ALBUMIN 3.8 3.4* 3.3* 3.3*    No results for input(s): "LIPASE", "AMYLASE" in the last 168 hours. No results for input(s): "AMMONIA" in the last 168 hours.  CBC: Recent Labs  Lab 07/09/22 1226 07/12/22 1041 07/12/22 2358 07/13/22 0918 07/14/22 0620 07/15/22 0451  WBC 15.1* 14.3* 15.2* 14.1* 14.2* 15.3*  NEUTROABS 2.1  --   --  1.8 2.0 2.0  HGB 13.4 13.2 12.7* 13.5 13.5 13.2  HCT 40.3 39.5 38.9* 40.7 40.2 38.8*  MCV 92.4 92.1 93.3 92.3 92.0 90.7  PLT 102* 111* 111* 108* 101* 100*     Cardiac Enzymes: No results for input(s): "CKTOTAL", "CKMB", "CKMBINDEX", "TROPONINI" in the last 168 hours.  BNP: Invalid input(s): "POCBNP"  CBG: Recent Labs  Lab 07/14/22 0735 07/14/22 1357 07/15/22  5643 07/15/22 0823 07/15/22 1158  GLUCAP 82 86 53* 91 101*     Microbiology: Results for orders placed or performed during the hospital encounter of 07/12/22  Resp panel by RT-PCR (RSV, Flu A&B, Covid) Anterior Nasal Swab     Status: None   Collection Time: 07/12/22  4:31 PM   Specimen: Anterior Nasal Swab  Result Value Ref Range Status   SARS Coronavirus 2 by RT PCR NEGATIVE NEGATIVE Final    Comment: (NOTE) SARS-CoV-2 target nucleic acids are NOT DETECTED.  The SARS-CoV-2 RNA is generally detectable in upper respiratory specimens during the acute phase of infection. The lowest concentration of SARS-CoV-2 viral copies this assay can detect is 138 copies/mL. A negative result does not preclude  SARS-Cov-2 infection and should not be used as the sole basis for treatment or other patient management decisions. A negative result may occur with  improper specimen collection/handling, submission of specimen other than nasopharyngeal swab, presence of viral mutation(s) within the areas targeted by this assay, and inadequate number of viral copies(<138 copies/mL). A negative result must be combined with clinical observations, patient history, and epidemiological information. The expected result is Negative.  Fact Sheet for Patients:  EntrepreneurPulse.com.au  Fact Sheet for Healthcare Providers:  IncredibleEmployment.be  This test is no t yet approved or cleared by the [place] FDA and  has been authorized for detection and/or diagnosis of SARS-CoV-2 by FDA under an Emergency Use Authorization (EUA). This EUA will remain  in effect (meaning this test can be used) for the duration of the COVID-19 declaration under Section 564(b)(1) of the Act, 21 U.S.C.section 360bbb-3(b)(1), unless the authorization is terminated  or revoked sooner.       Influenza A by PCR NEGATIVE NEGATIVE Final   Influenza B by PCR NEGATIVE NEGATIVE Final    Comment: (NOTE) The Xpert Xpress SARS-CoV-2/FLU/RSV plus assay is intended as an aid in the diagnosis of influenza from Nasopharyngeal swab specimens and should not be used as a sole basis for treatment. Nasal washings and aspirates are unacceptable for Xpert Xpress SARS-CoV-2/FLU/RSV testing.  Fact Sheet for Patients: EntrepreneurPulse.com.au  Fact Sheet for Healthcare Providers: IncredibleEmployment.be  This test is not yet approved or cleared by the [place] FDA and has been authorized for detection and/or diagnosis of SARS-CoV-2 by FDA under an Emergency Use Authorization (EUA). This EUA will remain in effect (meaning this test can be used) for the duration of  the COVID-19 declaration under Section 564(b)(1) of the Act, 21 U.S.C. section 360bbb-3(b)(1), unless the authorization is terminated or revoked.     Resp Syncytial Virus by PCR NEGATIVE NEGATIVE Final    Comment: (NOTE) Fact Sheet for Patients: EntrepreneurPulse.com.au  Fact Sheet for Healthcare Providers: IncredibleEmployment.be  This test is not yet approved or cleared by the [place] FDA and has been authorized for detection and/or diagnosis of SARS-CoV-2 by FDA under an Emergency Use Authorization (EUA). This EUA will remain in effect (meaning this test can be used) for the duration of the COVID-19 declaration under Section 564(b)(1) of the Act, 21 U.S.C. section 360bbb-3(b)(1), unless the authorization is terminated or revoked.  Performed at [facility], [address] [place], [place] [address]     Coagulation Studies: No results for input(s): "LABPROT", "INR" in the last 72 hours.  Urinalysis: No results for input(s): "COLORURINE", "LABSPEC", "PHURINE", "GLUCOSEU", "HGBUR", "BILIRUBINUR", "KETONESUR", "PROTEINUR", "UROBILINOGEN", "NITRITE", "LEUKOCYTESUR" in the last 72 hours.  Invalid input(s): "APPERANCEUR"    Imaging: MR ANGIO HEAD WO CONTRAST  Result Date: 07/14/2022 CLINICAL DATA:  Recent pontine infarct. EXAM: MRA HEAD WITHOUT CONTRAST TECHNIQUE: Angiographic images of the Circle of Willis were acquired using MRA technique without intravenous contrast. COMPARISON:  None Available. FINDINGS: Anterior circulation: Internal carotid arteries are widely patent with mild atherosclerotic irregularity at the skull base. There is a short segment occlusion of the left anterior cerebral artery A2 segment, but otherwise normal. There is mild atherosclerotic irregularity of the middle cerebral arteries without stenosis. Posterior circulation: Right P-comm is patent. There is atherosclerotic irregularity of the posterior  cerebral arteries levels remain patent. There is mild stenosis of the proximal left P2 segment. Superior and inferior cerebellar arteries are proximally patent. The vertebral artery V4 segments are normal. 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 Anatomic variants: None Other: Right mastoid effusion. IMPRESSION: 1. Short segment occlusion of the left anterior cerebral artery A2 segment. 2. 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 3. Mild stenosis of the proximal left P2 segment. Electronically Signed   By: Ulyses Jarred M.D.   On: 07/14/2022 20:31   US Carotid Bilateral  Result Date: 07/14/2022 CLINICAL DATA:  Stroke Hypertension Syncope Diabetes Tobacco use EXAM: BILATERAL CAROTID DUPLEX ULTRASOUND TECHNIQUE: Pearline Cables scale imaging, color Doppler and duplex ultrasound were performed of bilateral carotid and vertebral arteries in the neck. COMPARISON:  None available FINDINGS: Criteria: Quantification of carotid stenosis is based on velocity parameters that correlate the residual internal carotid diameter with NASCET-based stenosis levels, using the diameter of the distal internal carotid lumen as the denominator for stenosis measurement. The following velocity measurements were obtained: RIGHT ICA: 67/10 cm/sec CCA: 1 1/6 cm/sec SYSTOLIC ICA/CCA RATIO:  0.7 ECA: 82 cm/sec LEFT ICA: 79/12 cm/sec CCA: 56/2 cm/sec SYSTOLIC ICA/CCA RATIO:  0.8 ECA: 93 cm/sec RIGHT CAROTID ARTERY: Mild calcified plaque of the carotid bifurcation. RIGHT VERTEBRAL ARTERY:  Antegrade flow. LEFT CAROTID ARTERY: Mild diffuse atheromatous plaque of the common carotid artery. LEFT VERTEBRAL ARTERY:  Antegrade flow. IMPRESSION: No significant stenosis of internal carotid arteries. Electronically Signed   By: Miachel Roux M.D.   On: 07/14/2022 15:14     Medications:    anticoagulant sodium citrate      aspirin EC  81 mg Oral Daily   atorvastatin  40 mg Oral Daily   clopidogrel  75 mg Oral Daily    heparin  5,000 Units Subcutaneous Q8H   acetaminophen **OR** acetaminophen (TYLENOL) oral liquid 160 mg/5 mL **OR** acetaminophen, alteplase, anticoagulant sodium citrate, heparin, lidocaine (PF), lidocaine-prilocaine, ondansetron (ZOFRAN) IV, pentafluoroprop-tetrafluoroeth  Assessment/ Plan:  [name] is a 81 y.o.  male with past medical history of hypertension, diabetes, and end stage renal disease on hemodialysis. Patient presents to the ED from dialysis center after reportedly having a syncopal episode. He has been admitted for Slurred speech [R47.81] Acute CVA (cerebrovascular accident) [facility]) [I63.9] Cerebrovascular accident (CVA), unspecified mechanism ([place]) [I63.9]  [facility]/MWF/left aVF  End-stage renal disease on hemodialysis.  Will maintain outpatient schedule if possible.  Dialysis yesterday, UF 1 L achieved.  Next treatment scheduled for Sunday, due to holiday.  Will defer discharge planning to primary team.  2. Anemia of chronic kidney disease Lab Results  Component Value Date   HGB 13.2 07/15/2022    Desired target.  3. Secondary Hyperparathyroidism: with outpatient labs: PTH 953, phosphorus 6.4, calcium 9.4 on 07/05/2022  Lab Results  Component Value Date   PTH 141 (H) 08/18/2016   CALCIUM 9.1 07/15/2022  PHOS 5.9 (H) 07/15/2022    Currently prescribed Cinacalcet  and Auryxia outpatient.  Calcium acceptable, phosphorus remains slightly elevated however improving.  Will continue to monitor.  4.  Hypertension with chronic kidney disease.  Outpatient regimen includes amlodipine.  Currently held.  Blood pressure 163/74.  Acceptable for this patient.  5.  Acute CVA seen on MRI brain.  Outside of tPA window with improving symptoms.  Neurology consulted.  Negative neuroassessment today.   LOS: 3 [place] 12/30/202312:24 PM

## 2022-07-15 NOTE — Discharge Summary (Incomplete)
Physician Discharge Summary   [name] MRN: [id number] DOB: Jan 07, 1941  Admit date:     07/12/2022  Discharge date: 07/15/22  Discharge Physician: Kerney Elbe   PCP: Pcp, No   Recommendations at discharge:  {Tip this will not be part of the note when signed- Example include specific recommendations for outpatient follow-up, pending tests to follow-up on. 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 [facility] Course: No notes on file  Assessment and Plan: * Acute CVA (cerebrovascular accident) ([place]) Presyncope Patient presents with slurred speech and presyncope with MRI showing acute stroke.  Outside of tPA window by several hours and with improving symptoms Permissive hypertension for first 24-48 hrs post stroke onset: Prn Labetalol IV or Vasotec IV If BP greater than 220/120  Statins for LDL goal less than 70 ASA '81mg'$  daily, Plavix '75mg'$  daily x 3 weeks then monotherapy thereafter Telemetry, echocardiogram Avoid dextrose containing fluids, Maintain euglycemia, euthermia Neuro checks q4 hrs x 24 hrs and then per shift Head of bed 30 degrees Physical therapy/Occupational therapy/Speech therapy if failed dysphagia screen Neurology consult to follow   ESRD on hemodialysis ([place]) Reconsult for continuation of dialysis  Type 2 diabetes mellitus with stage 5 chronic kidney disease ([place]) Sliding scale insulin  Essential hypertension Hold antihypertensives to allow for permissive hypertension  Colon cancer ([place]) No acute issues suspected      {Tip this will not be part of the note when signed Body mass index is 25.82 kg/m. , ,  (Optional):26781}  {(NOTE) Pain  control PDMP Statment (Optional):26782} Consultants: *** Procedures performed: ***  Disposition: {Plan; Disposition:26390} Diet recommendation:  Discharge Diet Orders (From admission, onward)     Start     Ordered   07/15/22 0000  Diet - low sodium heart healthy       Comments: 1200 mL Fluid Restriction   07/15/22 1407           {Diet_Plan:26776} DISCHARGE MEDICATION: Allergies as of 07/15/2022   No Known Allergies      Medication List     STOP taking these medications    amLODipine 5 MG tablet Commonly known as: NORVASC   gabapentin 100 MG capsule Commonly known as: NEURONTIN   gabapentin 300 MG capsule Commonly known as: NEURONTIN   oxyCODONE 5 MG immediate release tablet Commonly known as: Oxy IR/ROXICODONE       TAKE these medications    acetaminophen 325 MG tablet Commonly known as: TYLENOL Take 2 tablets (650 mg total) by mouth every 4 (four) hours as needed for mild pain (or temp > 37.5 C (99.5 F)).   amoxicillin-clavulanate 500-125 MG tablet Commonly known as: AUGMENTIN Take 1 tablet by mouth daily.   aspirin EC 81 MG tablet Take 1 tablet (81 mg total) by mouth daily. Swallow whole. Start taking on: July 16, 2022   atorvastatin 40 MG tablet Commonly known as: LIPITOR Take 1 tablet (40 mg total) by mouth daily. Start taking on: July 16, 2022   Auryxia 1 GM 210 MG(Fe) tablet Generic drug: ferric citrate Take 210 mg by mouth 3 (three) times daily with meals.   cinacalcet 60 MG tablet Commonly known as: SENSIPAR Take 60 mg by mouth  daily.   clopidogrel 75 MG tablet Commonly known as: PLAVIX Take 1 tablet (75 mg total) by mouth daily for 21 days. Start taking on: July 16, 2022   famotidine 20 MG tablet Commonly known as: PEPCID Take 20 mg by mouth 2 (two) times daily. 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   ondansetron 4 MG tablet Commonly known as: ZOFRAN Take 4 mg by mouth 3 (three) times daily as needed for  nausea/vomiting.   Rena-Vite Rx 1 MG Tabs Take 1 tablet by mouth daily.   multivitamin Tabs tablet Take 1 tablet by mouth daily.   senna-docusate 8.6-50 MG tablet Commonly known as: Senokot-S Take 1 tablet by mouth 2 (two) times daily.   Vitamin D2 10 MCG (400 UNIT) Tabs Take 1 tab daily        Follow-up Information     Tsosie Billing, MD Follow up.   Specialty: Infectious Diseases Why: Follow up for Aortitis within 1-2 weeks Contact information: [place] [address] [phone]         Vladimir Crofts, MD Follow up.   Specialty: Neurology Why: Follow up for Stroke follow up in 6-8 weeks Contact information: [facility] [place] [address] [phone]         Anthonette Legato, MD Follow up.   Specialty: Nephrology Why: Follow up within 1-2 weeks Contact information: [place] [address] [phone]         Sindy Guadeloupe, MD Follow up.   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   Imaging Studies: MR ANGIO HEAD WO CONTRAST  Result Date: 07/14/2022 CLINICAL DATA:  Recent pontine infarct. EXAM: MRA HEAD WITHOUT CONTRAST TECHNIQUE: Angiographic images of the Circle of Willis were acquired using MRA technique without intravenous contrast. COMPARISON:  None Available. FINDINGS: Anterior circulation: Internal carotid arteries are widely patent with mild atherosclerotic irregularity at the skull base. There is a short  segment occlusion of the left anterior cerebral artery A2 segment, but otherwise normal. There is mild atherosclerotic irregularity of the middle cerebral arteries without stenosis. Posterior circulation: Right P-comm is patent. There is atherosclerotic irregularity of the posterior cerebral arteries levels remain patent. There is mild stenosis of the proximal left P2 segment. Superior and inferior cerebellar arteries are proximally patent. The vertebral artery V4 segments are normal. 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 Anatomic variants: None Other: Right mastoid effusion. IMPRESSION: 1. Short segment occlusion of the left anterior cerebral artery A2 segment. 2. 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 3. Mild stenosis of the proximal left P2 segment. Electronically Signed   By: Ulyses Jarred M.D.   On: 07/14/2022 20:31   US Carotid Bilateral  Result Date: 07/14/2022 CLINICAL DATA:  Stroke Hypertension Syncope Diabetes Tobacco use EXAM: BILATERAL CAROTID DUPLEX ULTRASOUND TECHNIQUE: Pearline Cables scale imaging, color Doppler and duplex ultrasound were performed of bilateral carotid and vertebral arteries in the neck. COMPARISON:  None available FINDINGS: Criteria: Quantification  of carotid stenosis is based on velocity parameters that correlate the residual internal carotid diameter with NASCET-based stenosis levels, using the diameter of the distal internal carotid lumen as the denominator for stenosis measurement. The following velocity measurements were obtained: RIGHT ICA: 67/10 cm/sec CCA: 1 1/6 cm/sec SYSTOLIC ICA/CCA RATIO:  0.7 ECA: 82 cm/sec LEFT ICA: 79/12 cm/sec CCA: 78/9 cm/sec SYSTOLIC ICA/CCA RATIO:  0.8 ECA: 93 cm/sec RIGHT CAROTID ARTERY: Mild calcified plaque of the carotid bifurcation. RIGHT VERTEBRAL ARTERY:  Antegrade flow. LEFT CAROTID ARTERY: Mild diffuse atheromatous plaque of the common carotid artery. LEFT VERTEBRAL ARTERY:  Antegrade flow. IMPRESSION:  No significant stenosis of internal carotid arteries. Electronically Signed   By: Miachel Roux M.D.   On: 07/14/2022 15:14   ECHOCARDIOGRAM COMPLETE  Result Date: 07/13/2022    ECHOCARDIOGRAM REPORT   Patient Name:   KENSON GROH Date of Exam: 07/13/2022 Medical Rec #:  [id number]           Height:       73.0 in Accession #:    [id number]          Weight:       203.3 lb Date of Birth:  10-Nov-1940            BSA:          2.166 m Patient Age:    43 years            BP:           170/71 mmHg Patient Gender: M                   HR:           62 bpm. Exam Location:  [facility] Procedure: 2D Echo, Cardiac Doppler and Color Doppler Indications:     Stroke  History:         Patient has prior history of Echocardiogram examinations, most                  recent 06/17/2018. Stroke; Risk Factors:Hypertension and                  Diabetes. Colon CA, ESRD on Dialysis.  Sonographer:     Wenda Low Referring Phys:  [id number] Athena Masse Diagnosing Phys: Kathlyn Sacramento MD IMPRESSIONS  1. Left ventricular ejection fraction, by estimation, is 55 to 60%. The left ventricle has normal function. The left ventricle has no regional wall motion abnormalities. There is moderate left ventricular hypertrophy. Left ventricular diastolic parameters are consistent with Grade II diastolic dysfunction (pseudonormalization).  2. Right ventricular systolic function is normal. The right ventricular size is normal. There is moderately elevated pulmonary artery systolic pressure.  3. Left atrial size was moderately dilated.  4. The mitral valve is normal in structure. Mild mitral valve regurgitation. No evidence of mitral stenosis.  5. The aortic valve is calcified. Aortic valve regurgitation is not visualized. Moderate aortic valve stenosis. Aortic valve area, by VTI measures 1.22 cm. Aortic valve mean gradient measures 22.0 mmHg. FINDINGS  Left Ventricle: Left ventricular ejection fraction, by estimation, is 55 to 60%. The left ventricle  has normal function. The left ventricle has no regional wall motion abnormalities. The left ventricular internal cavity size was normal in size. There is  moderate left ventricular hypertrophy. Left ventricular diastolic parameters are consistent with Grade II diastolic dysfunction (pseudonormalization). Right Ventricle: The right ventricular size is normal. No increase in right ventricular wall thickness. Right ventricular systolic  function is normal. There is moderately elevated pulmonary artery systolic pressure. The tricuspid regurgitant velocity is 3.11 m/s, and with an assumed right atrial pressure of 8 mmHg, the estimated right ventricular systolic pressure is 57.8 mmHg. Left Atrium: Left atrial size was moderately dilated. Right Atrium: Right atrial size was normal in size. Pericardium: Trivial pericardial effusion is present. Mitral Valve: The mitral valve is normal in structure. Mild mitral valve regurgitation. No evidence of mitral valve stenosis. MV peak gradient, 4.8 mmHg. The mean mitral valve gradient is 2.0 mmHg. Tricuspid Valve: The tricuspid valve is normal in structure. Tricuspid valve regurgitation is mild . No evidence of tricuspid stenosis. Aortic Valve: The aortic valve is calcified. Aortic valve regurgitation is not visualized. Moderate aortic stenosis is present. Aortic valve mean gradient measures 22.0 mmHg. Aortic valve peak gradient measures 41.3 mmHg. Aortic valve area, by VTI measures 1.22 cm. Pulmonic Valve: The pulmonic valve was normal in structure. Pulmonic valve regurgitation is mild. No evidence of pulmonic stenosis. Aorta: The aortic root is normal in size and structure. Venous: The inferior vena cava was not well visualized. IAS/Shunts: No atrial level shunt detected by color flow Doppler.  LEFT VENTRICLE PLAX 2D LVIDd:         4.80 cm   Diastology LVIDs:         4.18 cm   LV e' medial:    4.57 cm/s LV PW:         1.05 cm   LV E/e' medial:  21.8 LV IVS:        1.80 cm   LV e'  lateral:   6.20 cm/s LVOT diam:     2.00 cm   LV E/e' lateral: 16.0 LV SV:         98 LV SV Index:   45 LVOT Area:     3.14 cm  RIGHT VENTRICLE RV Basal diam:  3.60 cm RV Mid diam:    3.20 cm RV S prime:     13.10 cm/s TAPSE (M-mode): 3.0 cm LEFT ATRIUM             Index        RIGHT ATRIUM           Index LA diam:        5.40 cm 2.49 cm/m   RA Area:     18.90 cm LA Vol (A2C):   85.5 ml 39.46 ml/m  RA Volume:   47.80 ml  22.06 ml/m LA Vol (A4C):   87.8 ml 40.53 ml/m LA Biplane Vol: 89.9 ml 41.50 ml/m  AORTIC VALVE                     PULMONIC VALVE AV Area (Vmax):    1.28 cm      PV Vmax:       1.46 m/s AV Area (Vmean):   1.17 cm      PV Peak grad:  8.5 mmHg AV Area (VTI):     1.22 cm AV Vmax:           321.50 cm/s AV Vmean:          216.000 cm/s AV VTI:            0.804 m AV Peak Grad:      41.3 mmHg AV Mean Grad:      22.0 mmHg LVOT Vmax:         131.00 cm/s LVOT Vmean:  80.250 cm/s LVOT VTI:          0.311 m LVOT/AV VTI ratio: 0.39  AORTA Ao Root diam: 3.20 cm Ao Asc diam:  2.90 cm MITRAL VALVE                TRICUSPID VALVE MV Area (PHT): 2.72 cm     TR Peak grad:   38.7 mmHg MV Area VTI:   2.80 cm     TR Vmax:        311.00 cm/s MV Peak grad:  4.8 mmHg MV Mean grad:  2.0 mmHg     SHUNTS MV Vmax:       1.09 m/s     Systemic VTI:  0.31 m MV Vmean:      64.1 cm/s    Systemic Diam: 2.00 cm MV Decel Time: 279 msec MV E velocity: 99.50 cm/s MV A velocity: 102.00 cm/s MV E/A ratio:  0.98 Kathlyn Sacramento MD Electronically signed by Kathlyn Sacramento MD Signature Date/Time: 07/13/2022/1:07:02 PM    Final    MR BRAIN WO CONTRAST  Result Date: 07/12/2022 CLINICAL DATA:  Initial evaluation for acute TIA, near syncope EXAM: MRI HEAD WITHOUT CONTRAST TECHNIQUE: Multiplanar, multiecho pulse sequences of the brain and surrounding structures were obtained without intravenous contrast. COMPARISON:  Prior CT from earlier the same day as well as recent MRI from 07/09/2022. FINDINGS: Brain: Mild age-related  cerebral atrophy. Patchy and confluent T2/FLAIR hyperintensity involving the periventricular deep white matter both cerebral hemispheres, consistent with chronic small vessel ischemic disease. Few scatter remote lacunar infarcts present about the hemispheric cerebral white matter, deep gray nuclei, and pons. Few scattered small remote bilateral cerebellar infarcts. Subtle 1.5 cm focus of linear restricted diffusion seen involving the right paramedian pons (series 5, image 14), consistent with a small acute ischemic infarct. No associated hemorrhage or mass effect. No other evidence for acute or interval infarction. Gray-white matter differentiation otherwise maintained. No other areas of chronic cortical infarction. No acute or chronic intracranial blood products. No mass lesion, midline shift or mass effect. No hydrocephalus or extra-axial fluid collection. Pituitary gland and suprasellar region within normal limits. Vascular: Major intracranial vascular flow voids are maintained. Skull and upper cervical spine: Craniocervical junction within normal limits. Bone marrow signal intensity mildly heterogeneous but overall within normal limits. No acute scalp soft tissue abnormality. Small lipoma noted at the left suboccipital region. Sinuses/Orbits: Prior bilateral ocular lens replacement. Scattered mucosal thickening noted about the ethmoidal air cells and maxillary sinuses. Moderate right mastoid effusion. Visualized nasopharynx unremarkable. Other: None. IMPRESSION: 1. 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 2. Underlying age-related cerebral atrophy with chronic small vessel ischemic disease, with several scattered remote lacunar infarcts involving the hemispheric cerebral white matter, deep gray nuclei, and pons. Few scattered remote bilateral cerebellar infarcts. Electronically Signed   By: Jeannine Boga M.D.   On: 07/12/2022 22:33   DG Chest Portable 1 View  Result Date:  07/12/2022 CLINICAL DATA:  Shortness of breath. Near syncope during dialysis. Slurred speech. EXAM: PORTABLE CHEST 1 VIEW COMPARISON:  02/15/2021 FINDINGS: The patient is rotated to the right on today's radiograph, reducing diagnostic sensitivity and specificity. Mild enlargement of the cardiopericardial silhouette noted. Atherosclerotic calcification of the aortic arch. Mild prominence of upper zone lung markings suggesting cephalization of blood flow and pulmonary venous hypertension. No overt pulmonary edema. No blunting of the costophrenic angles. Left subclavian/axillary stent noted. IMPRESSION: 1. Mild enlargement of the cardiopericardial silhouette with cephalization of blood  flow and pulmonary venous hypertension but no overt edema. 2. Left subclavian/axillary stent noted. 3. Atherosclerotic calcification of the aortic arch. Aortic Atherosclerosis (ICD10-I70.0). Electronically Signed   By: Van Clines M.D.   On: 07/12/2022 17:25   CT HEAD WO CONTRAST  Result Date: 07/12/2022 CLINICAL DATA:  81 year old male with near syncope and slurred speech at dialysis. Vomiting. EXAM: CT HEAD WITHOUT CONTRAST TECHNIQUE: Contiguous axial images were obtained from the base of the skull through the vertex without intravenous contrast. RADIATION DOSE REDUCTION: This exam was performed according to the departmental dose-optimization program which includes automated exposure control, adjustment of the mA and/or kV according to patient size and/or use of iterative reconstruction technique. COMPARISON:  Brain MRI and head CT 07/09/2022. FINDINGS: Brain: No midline shift, mass effect, or evidence of intracranial mass lesion. No ventriculomegaly. No acute intracranial hemorrhage identified. No cortically based acute infarct identified. Stable gray-white differentiation with fairly advanced patchy bilateral white matter hypodensity, heterogeneity in the left thalamus and brainstem, central cerebellum. Vascular:  Extensive Calcified atherosclerosis at the skull base. No suspicious intracranial vascular hyperdensity. Skull: No acute osseous abnormality identified. Sinuses/Orbits: Visualized paranasal sinuses are stable and well aerated. Right mastoid effusion is stable. Other: No acute orbit or scalp soft tissue finding. Calcified scalp vessel atherosclerosis. IMPRESSION: 1. No acute intracranial abnormality. Stable non contrast CT appearance of advanced small vessel disease. 2. Stable right mastoid effusion. Electronically Signed   By: Genevie Ann M.D.   On: 07/12/2022 10:58   MR BRAIN WO CONTRAST  Result Date: 07/09/2022 CLINICAL DATA:  Slurred speech. EXAM: MRI HEAD WITHOUT CONTRAST TECHNIQUE: Multiplanar, multiecho pulse sequences of the brain and surrounding structures were obtained without intravenous contrast. COMPARISON:  Same-day CT head FINDINGS: Brain: There is no acute intracranial hemorrhage, extra-axial fluid collection, or acute infarct. Parenchymal volume is within expected limits for age. The ventricles are normal in size. Gray-white differentiation is preserved. Patchy FLAIR signal abnormality in the supratentorial white matter likely reflects sequela of moderate chronic small-vessel ischemic change. There is a remote infarct in the right parietal white matter and small remote infarcts in the bilateral cerebellar hemispheres and brainstem. There is a probable prominent perivascular space in the left basal ganglia. The pituitary and suprasellar region are normal. There is no mass lesion. There is no mass effect or midline shift. Vascular: Normal flow voids. Skull and upper cervical spine: Normal marrow signal. Sinuses/Orbits: There is mild mucosal thickening in the paranasal sinuses. Bilateral lens implants are in place. The globes and orbits are otherwise unremarkable. Other: There is a right mastoid effusion, nonspecific. The imaged right nasopharynx is unremarkable. IMPRESSION: 1. No acute intracranial  pathology. 2. Multiple small remote lacunar infarcts and background chronic small-vessel ischemic change as above. Electronically Signed   By: Valetta Mole M.D.   On: 07/09/2022 17:50   CT HEAD WO CONTRAST  Result Date: 07/09/2022 CLINICAL DATA:  Transient ischemic attack (TIA) EXAM: CT HEAD WITHOUT CONTRAST TECHNIQUE: Contiguous axial images were obtained from the base of the skull through the vertex without intravenous contrast. RADIATION DOSE REDUCTION: This exam was performed according to the departmental dose-optimization program which includes automated exposure control, adjustment of the mA and/or kV according to patient size and/or use of iterative reconstruction technique. COMPARISON:  01/28/2011 FINDINGS: Brain: No evidence of acute infarction, hemorrhage, hydrocephalus, extra-axial collection or mass lesion/mass effect. Patchy low-density changes within the periventricular and subcortical white matter compatible with chronic microvascular ischemic change. Mild age-related cerebral volume loss. Vascular: Atherosclerotic calcifications  involving the large vessels of the skull base. No unexpected hyperdense vessel. Skull: Normal. Negative for fracture or focal lesion. Sinuses/Orbits: Partial right mastoid effusion. Other: None. IMPRESSION: 1. No acute intracranial findings. 2. Chronic microvascular ischemic change and cerebral volume loss. 3. Partial right mastoid effusion. Electronically Signed   By: Davina Poke D.O.   On: 07/09/2022 13:27    Microbiology: Results for orders placed or performed during the hospital encounter of 07/12/22  Resp panel by RT-PCR (RSV, Flu A&B, Covid) Anterior Nasal Swab     Status: None   Collection Time: 07/12/22  4:31 PM   Specimen: Anterior Nasal Swab  Result Value Ref Range Status   SARS Coronavirus 2 by RT PCR NEGATIVE NEGATIVE Final    Comment: (NOTE) SARS-CoV-2 target nucleic acids are NOT DETECTED.  The SARS-CoV-2 RNA is generally detectable in  upper respiratory specimens during the acute phase of infection. The lowest concentration of SARS-CoV-2 viral copies this assay can detect is 138 copies/mL. A negative result does not preclude SARS-Cov-2 infection and should not be used as the sole basis for treatment or other patient management decisions. A negative result may occur with  improper specimen collection/handling, submission of specimen other than nasopharyngeal swab, presence of viral mutation(s) within the areas targeted by this assay, and inadequate number of viral copies(<138 copies/mL). A negative result must be combined with clinical observations, patient history, and epidemiological information. The expected result is Negative.  Fact Sheet for Patients:  EntrepreneurPulse.com.au  Fact Sheet for Healthcare Providers:  IncredibleEmployment.be  This test is no t yet approved or cleared by the [place] FDA and  has been authorized for detection and/or diagnosis of SARS-CoV-2 by FDA under an Emergency Use Authorization (EUA). This EUA will remain  in effect (meaning this test can be used) for the duration of the COVID-19 declaration under Section 564(b)(1) of the Act, 21 U.S.C.section 360bbb-3(b)(1), unless the authorization is terminated  or revoked sooner.       Influenza A by PCR NEGATIVE NEGATIVE Final   Influenza B by PCR NEGATIVE NEGATIVE Final    Comment: (NOTE) The Xpert Xpress SARS-CoV-2/FLU/RSV plus assay is intended as an aid in the diagnosis of influenza from Nasopharyngeal swab specimens and should not be used as a sole basis for treatment. Nasal washings and aspirates are unacceptable for Xpert Xpress SARS-CoV-2/FLU/RSV testing.  Fact Sheet for Patients: EntrepreneurPulse.com.au  Fact Sheet for Healthcare Providers: IncredibleEmployment.be  This test is not yet approved or cleared by the [place] FDA and has been  authorized for detection and/or diagnosis of SARS-CoV-2 by FDA under an Emergency Use Authorization (EUA). This EUA will remain in effect (meaning this test can be used) for the duration of the COVID-19 declaration under Section 564(b)(1) of the Act, 21 U.S.C. section 360bbb-3(b)(1), unless the authorization is terminated or revoked.     Resp Syncytial Virus by PCR NEGATIVE NEGATIVE Final    Comment: (NOTE) Fact Sheet for Patients: EntrepreneurPulse.com.au  Fact Sheet for Healthcare Providers: IncredibleEmployment.be  This test is not yet approved or cleared by the [place] FDA and has been authorized for detection and/or diagnosis of SARS-CoV-2 by FDA under an Emergency Use Authorization (EUA). This EUA will remain in effect (meaning this test can be used) for the duration of the COVID-19 declaration under Section 564(b)(1) of the Act, 21 U.S.C. section 360bbb-3(b)(1), unless the authorization is terminated or revoked.  Performed at [facility] [facility], [address] [place], [place] [address]  Labs: CBC: Recent Labs  Lab 07/09/22 1226 07/12/22 1041 07/12/22 2358 07/13/22 0918 07/14/22 0620 07/15/22 0451  WBC 15.1* 14.3* 15.2* 14.1* 14.2* 15.3*  NEUTROABS 2.1  --   --  1.8 2.0 2.0  HGB 13.4 13.2 12.7* 13.5 13.5 13.2  HCT 40.3 39.5 38.9* 40.7 40.2 38.8*  MCV 92.4 92.1 93.3 92.3 92.0 90.7  PLT 102* 111* 111* 108* 101* 427*   Basic Metabolic Panel: Recent Labs  Lab 07/09/22 1226 07/12/22 1041 07/12/22 2358 07/13/22 0918 07/14/22 0620 07/15/22 0451  NA 141 142  --  141 142 140  K 3.7 3.7  --  4.3 4.6 4.2  CL 101 101  --  102 104 99  CO2 29 30  --  '28 26 28  '$ GLUCOSE 95 81  --  113* 76 71  BUN 20 26*  --  37* 55* 44*  CREATININE 4.05* 4.98* 6.77* 7.21* 8.94* 6.76*  CALCIUM 9.1 8.6*  --  8.9 8.9 9.1  MG  --   --   --  2.1 2.3 2.0  PHOS  --   --   --  6.8* 7.8* 5.9*   Liver Function Tests: Recent Labs  Lab  07/09/22 1226 07/13/22 0918 07/14/22 0620 07/15/22 0451  AST '17 18 17 21  '$ ALT '14 14 14 15  '$ ALKPHOS 85 78 75 69  BILITOT 1.1 0.8 1.0 0.9  PROT 6.6 5.9* 5.9* 5.6*  ALBUMIN 3.8 3.4* 3.3* 3.3*   CBG: Recent Labs  Lab 07/14/22 0735 07/14/22 1357 07/15/22 0752 07/15/22 0823 07/15/22 1158  GLUCAP 82 86 67* 91 101*    Discharge time spent: {LESS THAN/GREATER THAN:26388} 30 minutes.  Signed: Kerney Elbe, DO [facility] 07/15/2022

## 2022-07-15 NOTE — TOC Transition Note (Signed)
Transition of Care [facility]) - CM/SW Discharge Note   Patient Details  Name: Jonathan Mcgrath MRN: [id number] Date of Birth: 16-Dec-1940  [facility] CM/SW Contact:  Gerilyn Pilgrim, LCSW Phone Number: 07/15/2022, 2:39 PM   Clinical Narrative:   SW spoke with pt's daughter who states he is not interested in [facility] or any DME.     Final next level of care: Home/Self Care Barriers to Discharge: Barriers Resolved   Patient Goals and CMS Choice   Choice offered to / list presented to : Adult Children  Discharge Placement                    Name of family member notified: teresa Patient and family notified of of transfer: 07/15/22  Discharge Plan and Services Additional resources added to the After Visit Summary for                                       Social Determinants of Health (SDOH) Interventions SDOH Screenings   Tobacco Use: Low Risk  (07/13/2022)     Readmission Risk Interventions     No data to display

## 2022-07-15 NOTE — Progress Notes (Signed)
Discharge instructions reviewed with patient including followup visits and new medications.  Understanding was verbalized and all questions were answered.  IV removed without complication; patient tolerated well.  Patient discharged home via wheelchair in stable condition escorted by nursing staff.  

## 2022-07-18 LAB — HEPATITIS B E ANTIBODY: Hep B E Ab: NEGATIVE

## 2022-07-18 NOTE — Discharge Summary (Signed)
Physician Discharge Summary   [name] MRN: [id number] DOB: Jan 27, 1941  Admit date:     07/12/2022  Discharge date: 07/15/2022  Discharge Physician: Raiford Noble, DO   PCP: Pcp, No   Recommendations at discharge:   Follow-up with PCP within 1 to 2 weeks and repeat CBC, CMP, mag, Phos within 1 week Follow-up with neurology in 6 to 8 weeks Follow-up with nephrology and consider having dialysis prior to being changed given his sustained periods of hypotension during dialysis the 24th and 27th which explains his symptoms of slurred speech exacerbated by dehydration and likely causing microangiopathy and precipitate a small vessel stroke Follow-up with hematology for CLL follow-up Follow-up with ID for aortitis follow-up and continue Augmentin  Discharge Diagnoses: Principal Problem:   Acute CVA (cerebrovascular accident) ([place]) Active Problems:   Colon cancer ([place])   Essential hypertension   Type 2 diabetes mellitus with stage 5 chronic kidney disease ([place])   ESRD on hemodialysis ([place])  Resolved Problems:   * No resolved hospital problems. [facility] Course: HPI per Dr. Judd Mcgrath on 07/12/22 Jonathan Mcgrath is a 82 y.o. male with medical history significant for hypertension, end-stage renal disease on hemodialysis who presents to the ED from dialysis for evaluation of slurred speech and near syncope that he developed near the end of his session.  Patient had a similar episode on 12/20 for which she was evaluated in the ED and discharged after reassuring workup.  He denies headache, visual disturbance, one-sided weakness numbness or tingling.  His symptoms had improved by arrival.  Patient was out of tPA window by assessment and the ED. ED course and data review: BP 183/71 with otherwise normal vitals.  WBC 14,000, troponin 53-53, respiratory viral panel negative for COVID flu and RSV.  EKG, personally viewed and interpreted showing sinus at 67 with first-degree  AV block and nonspecific ST-T wave changes.  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 By the time of stroke diagnosis, patient was several hours out of tPA window having waited in the ED for over 10 hours.    **Interim History Patient is currently undergoing stroke workup and nephrology was consulted for maintenance of dialysis and plans for dialysis tomorrow. Neurology recommending MRA and Carotid Dopplers and this was done and the MRA head showed a short segment occlusion of the left anterior cerebral artery A2 segment and moderate stenosis of the mid to distal basilar artery as well as mild stenosis of proximal left P2 segment and the carotid ultrasound showed a right ICA that had mild calcified plaque at the carotid bifurcation as well as a left ICA showed mild diffuse atheromatous plaque of the common carotid artery as well as bilateral lateral vertebral artery showing antegrade flow.  Neurology evaluated and recommending continuing dual antiplatelet therapy for 3 weeks and just discontinuing Plavix and then to continuing aspirin indefinitely.  He improved significantly and was deemed medically stable for discharge and PT OT recommended home health.  Nephrology felt he was stable from a dialysis standpoint and he will be dialyzed on his normal schedule now.  He is medically stable for discharge and will need to follow-up with PCP, nephrology as well as neurology in outpatient setting  Assessment and Plan: Acute CVA (cerebrovascular accident) ([place]) Presyncope -Head CT done and showed "No acute intracranial abnormality. Stable non contrast CT appearance of advanced small vessel disease. Stable right mastoid effusion." -Patient presents with slurred speech and presyncope  with MRI showing acute stroke as it showed "1.5 cm acute ischemic nonhemorrhagic right paramedian pontine infarct. Underlying age-related cerebral atrophy with chronic small vessel  ischemic disease, with several scattered remote lacunar infarcts involving the hemispheric cerebral white matter, deep gray nuclei, and pons. Few scattered remote bilateral cerebellar infarcts." -Outside of tPA window by several hours and with improving symptoms -Permissive hypertension for first 24-48 hrs post stroke onset: Prn Labetalol IV or Vasotec IV If BP greater than 220/120  -Statins for LDL goal less than 70; currently his lipid panel shows a total cholesterol/HDL ratio 4.4, cholesterol level 197, HDL 45, LDL 134, triglycerides of 91 and VLDL of 18 -Check HbA1c and is now 5.8 -Initiated on ASA 325 mg daily and will defer to neurology to initiate 41m daily; also started on Plavix 790mdaily x 3 weeks then monotherapy thereafter -Neurology now recommending MRA of the Head and a Carotid U/S which is done as below -Continue with telemetry, echocardiogram -ECHO done and showed EF of 55-60% with Moderate LVH and Grade 2 DD -Avoid dextrose containing fluids, Maintain euglycemia, euthermia -Neuro checks q4 hrs x 24 hrs and then per shift -Head of bed 30 degrees -Physical therapy/Occupational therapy/Speech therapy to evaluate and treat and PT/OT pending but SLP feels that all ST goals met and is appropriate for discharge from SLP services and PT OT recommending home health -Neurology consulted and appreciate further evaluation and recommendations have now recommended dual antiplatelet therapy with aspirin and Plavix for 3 weeks and then just aspirin monotherapy -Continue with statin as well and follow-up with PCP in outpatient setting as well as neurology in 6 to 8 weeks  [facility] -Nephrology consulted for continuation of dialysis -BUN/Cr Trend: Recent Labs  Lab 07/09/22 1226 07/12/22 1041 07/12/22 2358 07/13/22 0918 07/14/22 0620 07/15/22 0451  BUN 20 26*  --  37* 55* 44*  CREATININE 4.05* 4.98*   < > 7.21* 8.94* 6.76*   < > = values in this  interval not displayed.  -Avoid Nephrotoxic Medications, Contrast Dyes, Hypotension and Dehydration to Ensure Adequate Renal Perfusion and will need to Renally Adjust Meds -Patient's Phos Level is now 4.9 -Continue to Monitor and Trend Renal Function carefully and repeat CMP in the AM    Type 2 diabetes mellitus with stage 5 chronic kidney disease ([facility] [place]-Continue with sliding scale insulin -CBGs ranging from 67-101 -Recent hemoglobin A1c was 5.8   Essential Hypertension -Hold antihypertensives to allow for permissive hypertension -Continue monitor blood pressures per protocol -Last blood pressure reading was elevated at 175/89   Colon cancer ([facility]) -No acute issues suspected   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 -Likely reactive -Patient's WBC went from 14.3 -> 15.2 -> 14.1 -> 14.2 -> 15.3 -Continue to Monitor and Trend and repeat within 1 week  History of aortitis -Continue home Augmentin and follow-up with ID in outpatient setting given no infectious symptoms noted  Thrombocytopenia -Patient's Platelet Count went from 111 -> 111 -> 108 -> 101 -> 100 -Continue to Monitor for S/Sx of Bleeding; No overt bleeding noted -Repeat CBC in the AM    Hypoalbuminemia  -Patient's Albumin Level is now 3.4 -> 3.3 x2 -Continue to Monitor and Trend -Repeat CMP in the AM   Consultants: Nephrology, neurology Procedures performed: As delineated as above Disposition: Home health Diet recommendation:  Discharge Diet Orders (From admission, onward)     Start     Ordered   07/15/22 0000  Diet -  low sodium heart healthy       Comments: 1200 mL Fluid Restriction   07/15/22 1407           Renal diet DISCHARGE MEDICATION: Allergies as of 07/15/2022   No Known Allergies      Medication List     STOP taking these medications    amLODipine 5 MG tablet Commonly known as: NORVASC   gabapentin 100 MG capsule Commonly known as: NEURONTIN   gabapentin 300 MG  capsule Commonly known as: NEURONTIN   oxyCODONE 5 MG immediate release tablet Commonly known as: Oxy IR/ROXICODONE       TAKE these medications    acetaminophen 325 MG tablet Commonly known as: TYLENOL Take 2 tablets (650 mg total) by mouth every 4 (four) hours as needed for mild pain (or temp > 37.5 C (99.5 F)).   amoxicillin-clavulanate 500-125 MG tablet Commonly known as: AUGMENTIN Take 1 tablet by mouth daily.   aspirin EC 81 MG tablet Take 1 tablet (81 mg total) by mouth daily. Swallow whole.   atorvastatin 40 MG tablet Commonly known as: LIPITOR Take 1 tablet (40 mg total) by mouth daily.   Auryxia 1 GM 210 MG(Fe) tablet Generic drug: ferric citrate Take 210 mg by mouth 3 (three) times daily with meals.   cinacalcet 60 MG tablet Commonly known as: SENSIPAR Take 60 mg by mouth daily.   clopidogrel 75 MG tablet Commonly known as: PLAVIX Take 1 tablet (75 mg total) by mouth daily for 21 days.   famotidine 20 MG tablet Commonly known as: PEPCID Take 20 mg by mouth 2 (two) times daily. 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   ondansetron 4 MG tablet Commonly known as: ZOFRAN Take 4 mg by mouth 3 (three) times daily as needed for nausea/vomiting.   Rena-Vite Rx 1 MG Tabs Take 1 tablet by mouth daily.   multivitamin Tabs tablet Take 1 tablet by mouth daily.   senna-docusate 8.6-50 MG tablet Commonly known as: Senokot-S Take 1 tablet by mouth 2 (two) times daily.   Vitamin D2 10 MCG (400 UNIT) Tabs Take 1 tab daily        Follow-up Information     Tsosie Billing, MD Follow up.   Specialty: Infectious Diseases Why: Follow up for Aortitis within 1-2 weeks Contact information: [place] [address] [phone]         Vladimir Crofts, MD Follow up.   Specialty: Neurology Why: Follow up for Stroke follow up in 6-8 weeks Contact information: [facility] [place]  [id number] [phone]         Anthonette Legato, MD Follow up.   Specialty: Nephrology Why: Follow up within 1-2 weeks Contact information: [place] [address] [phone]         Sindy Guadeloupe, MD Follow up.   Specialty: Oncology Why: Follow up within 1-2 weeks for CLL follow up Contact information: [place] [address] [phone]                Discharge Exam: Filed Weights   07/13/22 0941 07/14/22 0830 07/15/22 0022  Weight: 92.2 kg 90.2 kg 88.8 kg   Vitals:   07/15/22 0753 07/15/22 1159  BP: (!) 163/74 (!) 167/77  Pulse: 73 74  Resp: 17 18  Temp: 97.9 F (36.6 C) 97.9 F (36.6 C)  SpO2: 98% 96%   Examination: Physical Exam:  Constitutional: WN/WD slightly  overweight African-American male currently no acute distress Respiratory: Diminished to auscultation bilaterally with coarse breath sounds, no wheezing, rales, rhonchi or crackles. Normal respiratory effort and patient is not tachypenic. No accessory muscle use.  Unlabored breathing Cardiovascular: RRR, no murmurs / rubs / gallops. S1 and S2 auscultated. No extremity edema Abdomen: Soft, non-tender, mildly distended secondary to body habitus. Bowel sounds positive.  GU: Deferred. Musculoskeletal: No clubbing / cyanosis of digits/nails. No joint deformity upper and lower extremities.  Skin: No rashes, lesions, ulcers on limited skin evaluation. No induration; Warm and dry.  Neurologic: CN 2-12 grossly intact with no focal deficits. Romberg sign and cerebellar reflexes not assessed.  Psychiatric: Normal judgment and insight. Alert and oriented x 3. Normal mood and appropriate affect.    Condition at discharge: stable  The results of significant diagnostics from this hospitalization (including imaging, microbiology, ancillary and laboratory) are listed below for reference.   Imaging Studies: MR ANGIO HEAD WO CONTRAST  Result Date: 07/14/2022 CLINICAL  DATA:  Recent pontine infarct. EXAM: MRA HEAD WITHOUT CONTRAST TECHNIQUE: Angiographic images of the Circle of Willis were acquired using MRA technique without intravenous contrast. COMPARISON:  None Available. FINDINGS: Anterior circulation: Internal carotid arteries are widely patent with mild atherosclerotic irregularity at the skull base. There is a short segment occlusion of the left anterior cerebral artery A2 segment, but otherwise normal. There is mild atherosclerotic irregularity of the middle cerebral arteries without stenosis. Posterior circulation: Right P-comm is patent. There is atherosclerotic irregularity of the posterior cerebral arteries levels remain patent. There is mild stenosis of the proximal left P2 segment. Superior and inferior cerebellar arteries are proximally patent. The vertebral artery V4 segments are normal. 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 Anatomic variants: None Other: Right mastoid effusion. IMPRESSION: 1. Short segment occlusion of the left anterior cerebral artery A2 segment. 2. 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 3. Mild stenosis of the proximal left P2 segment. Electronically Signed   By: Ulyses Jarred M.D.   On: 07/14/2022 20:31   US Carotid Bilateral  Result Date: 07/14/2022 CLINICAL DATA:  Stroke Hypertension Syncope Diabetes Tobacco use EXAM: BILATERAL CAROTID DUPLEX ULTRASOUND TECHNIQUE: Pearline Cables scale imaging, color Doppler and duplex ultrasound were performed of bilateral carotid and vertebral arteries in the neck. COMPARISON:  None available FINDINGS: Criteria: Quantification of carotid stenosis is based on velocity parameters that correlate the residual internal carotid diameter with NASCET-based stenosis levels, using the diameter of the distal internal carotid lumen as the denominator for stenosis measurement. The following velocity measurements were obtained: RIGHT ICA: 67/10 cm/sec CCA: 1 1/6 cm/sec SYSTOLIC  ICA/CCA RATIO:  0.7 ECA: 82 cm/sec LEFT ICA: 79/12 cm/sec CCA: 25/8 cm/sec SYSTOLIC ICA/CCA RATIO:  0.8 ECA: 93 cm/sec RIGHT CAROTID ARTERY: Mild calcified plaque of the carotid bifurcation. RIGHT VERTEBRAL ARTERY:  Antegrade flow. LEFT CAROTID ARTERY: Mild diffuse atheromatous plaque of the common carotid artery. LEFT VERTEBRAL ARTERY:  Antegrade flow. IMPRESSION: No significant stenosis of internal carotid arteries. Electronically Signed   By: Miachel Roux M.D.   On: 07/14/2022 15:14   ECHOCARDIOGRAM COMPLETE  Result Date: 07/13/2022    ECHOCARDIOGRAM REPORT   Patient Name:   EVERITT WENNER Date of Exam: 07/13/2022 Medical Rec #:  [id number]           Height:       73.0 in Accession #:    [id number]          Weight:  203.3 lb Date of Birth:  1940-08-26            BSA:          2.166 m Patient Age:    24 years            BP:           170/71 mmHg Patient Gender: M                   HR:           62 bpm. Exam Location:  [facility] Procedure: 2D Echo, Cardiac Doppler and Color Doppler Indications:     Stroke  History:         Patient has prior history of Echocardiogram examinations, most                  recent 06/17/2018. Stroke; Risk Factors:Hypertension and                  Diabetes. Colon CA, ESRD on Dialysis.  Sonographer:     Wenda Low Referring Phys:  [id number] Athena Masse Diagnosing Phys: Kathlyn Sacramento MD IMPRESSIONS  1. Left ventricular ejection fraction, by estimation, is 55 to 60%. The left ventricle has normal function. The left ventricle has no regional wall motion abnormalities. There is moderate left ventricular hypertrophy. Left ventricular diastolic parameters are consistent with Grade II diastolic dysfunction (pseudonormalization).  2. Right ventricular systolic function is normal. The right ventricular size is normal. There is moderately elevated pulmonary artery systolic pressure.  3. Left atrial size was moderately dilated.  4. The mitral valve is normal in structure. Mild mitral  valve regurgitation. No evidence of mitral stenosis.  5. The aortic valve is calcified. Aortic valve regurgitation is not visualized. Moderate aortic valve stenosis. Aortic valve area, by VTI measures 1.22 cm. Aortic valve mean gradient measures 22.0 mmHg. FINDINGS  Left Ventricle: Left ventricular ejection fraction, by estimation, is 55 to 60%. The left ventricle has normal function. The left ventricle has no regional wall motion abnormalities. The left ventricular internal cavity size was normal in size. There is  moderate left ventricular hypertrophy. Left ventricular diastolic parameters are consistent with Grade II diastolic dysfunction (pseudonormalization). Right Ventricle: The right ventricular size is normal. No increase in right ventricular wall thickness. Right ventricular systolic function is normal. There is moderately elevated pulmonary artery systolic pressure. The tricuspid regurgitant velocity is 3.11 m/s, and with an assumed right atrial pressure of 8 mmHg, the estimated right ventricular systolic pressure is 28.7 mmHg. Left Atrium: Left atrial size was moderately dilated. Right Atrium: Right atrial size was normal in size. Pericardium: Trivial pericardial effusion is present. Mitral Valve: The mitral valve is normal in structure. Mild mitral valve regurgitation. No evidence of mitral valve stenosis. MV peak gradient, 4.8 mmHg. The mean mitral valve gradient is 2.0 mmHg. Tricuspid Valve: The tricuspid valve is normal in structure. Tricuspid valve regurgitation is mild . No evidence of tricuspid stenosis. Aortic Valve: The aortic valve is calcified. Aortic valve regurgitation is not visualized. Moderate aortic stenosis is present. Aortic valve mean gradient measures 22.0 mmHg. Aortic valve peak gradient measures 41.3 mmHg. Aortic valve area, by VTI measures 1.22 cm. Pulmonic Valve: The pulmonic valve was normal in structure. Pulmonic valve regurgitation is mild. No evidence of pulmonic stenosis.  Aorta: The aortic root is normal in size and structure. Venous: The inferior vena cava was not well visualized. IAS/Shunts: No atrial level shunt detected by color flow  Doppler.  LEFT VENTRICLE PLAX 2D LVIDd:         4.80 cm   Diastology LVIDs:         4.18 cm   LV e' medial:    4.57 cm/s LV PW:         1.05 cm   LV E/e' medial:  21.8 LV IVS:        1.80 cm   LV e' lateral:   6.20 cm/s LVOT diam:     2.00 cm   LV E/e' lateral: 16.0 LV SV:         98 LV SV Index:   45 LVOT Area:     3.14 cm  RIGHT VENTRICLE RV Basal diam:  3.60 cm RV Mid diam:    3.20 cm RV S prime:     13.10 cm/s TAPSE (M-mode): 3.0 cm LEFT ATRIUM             Index        RIGHT ATRIUM           Index LA diam:        5.40 cm 2.49 cm/m   RA Area:     18.90 cm LA Vol (A2C):   85.5 ml 39.46 ml/m  RA Volume:   47.80 ml  22.06 ml/m LA Vol (A4C):   87.8 ml 40.53 ml/m LA Biplane Vol: 89.9 ml 41.50 ml/m  AORTIC VALVE                     PULMONIC VALVE AV Area (Vmax):    1.28 cm      PV Vmax:       1.46 m/s AV Area (Vmean):   1.17 cm      PV Peak grad:  8.5 mmHg AV Area (VTI):     1.22 cm AV Vmax:           321.50 cm/s AV Vmean:          216.000 cm/s AV VTI:            0.804 m AV Peak Grad:      41.3 mmHg AV Mean Grad:      22.0 mmHg LVOT Vmax:         131.00 cm/s LVOT Vmean:        80.250 cm/s LVOT VTI:          0.311 m LVOT/AV VTI ratio: 0.39  AORTA Ao Root diam: 3.20 cm Ao Asc diam:  2.90 cm MITRAL VALVE                TRICUSPID VALVE MV Area (PHT): 2.72 cm     TR Peak grad:   38.7 mmHg MV Area VTI:   2.80 cm     TR Vmax:        311.00 cm/s MV Peak grad:  4.8 mmHg MV Mean grad:  2.0 mmHg     SHUNTS MV Vmax:       1.09 m/s     Systemic VTI:  0.31 m MV Vmean:      64.1 cm/s    Systemic Diam: 2.00 cm MV Decel Time: 279 msec MV E velocity: 99.50 cm/s MV A velocity: 102.00 cm/s MV E/A ratio:  0.98 Kathlyn Sacramento MD Electronically signed by Kathlyn Sacramento MD Signature Date/Time: 07/13/2022/1:07:02 PM    Final    MR BRAIN WO CONTRAST  Result Date:  07/12/2022 CLINICAL DATA:  Initial evaluation for acute TIA, near syncope  EXAM: MRI HEAD WITHOUT CONTRAST TECHNIQUE: Multiplanar, multiecho pulse sequences of the brain and surrounding structures were obtained without intravenous contrast. COMPARISON:  Prior CT from earlier the same day as well as recent MRI from 07/09/2022. FINDINGS: Brain: Mild age-related cerebral atrophy. Patchy and confluent T2/FLAIR hyperintensity involving the periventricular deep white matter both cerebral hemispheres, consistent with chronic small vessel ischemic disease. Few scatter remote lacunar infarcts present about the hemispheric cerebral white matter, deep gray nuclei, and pons. Few scattered small remote bilateral cerebellar infarcts. Subtle 1.5 cm focus of linear restricted diffusion seen involving the right paramedian pons (series 5, image 14), consistent with a small acute ischemic infarct. No associated hemorrhage or mass effect. No other evidence for acute or interval infarction. Gray-white matter differentiation otherwise maintained. No other areas of chronic cortical infarction. No acute or chronic intracranial blood products. No mass lesion, midline shift or mass effect. No hydrocephalus or extra-axial fluid collection. Pituitary gland and suprasellar region within normal limits. Vascular: Major intracranial vascular flow voids are maintained. Skull and upper cervical spine: Craniocervical junction within normal limits. Bone marrow signal intensity mildly heterogeneous but overall within normal limits. No acute scalp soft tissue abnormality. Small lipoma noted at the left suboccipital region. Sinuses/Orbits: Prior bilateral ocular lens replacement. Scattered mucosal thickening noted about the ethmoidal air cells and maxillary sinuses. Moderate right mastoid effusion. Visualized nasopharynx unremarkable. Other: None. IMPRESSION: 1. 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 2. Underlying  age-related cerebral atrophy with chronic small vessel ischemic disease, with several scattered remote lacunar infarcts involving the hemispheric cerebral white matter, deep gray nuclei, and pons. Few scattered remote bilateral cerebellar infarcts. Electronically Signed   By: Jeannine Boga M.D.   On: 07/12/2022 22:33   DG Chest Portable 1 View  Result Date: 07/12/2022 CLINICAL DATA:  Shortness of breath. Near syncope during dialysis. Slurred speech. EXAM: PORTABLE CHEST 1 VIEW COMPARISON:  02/15/2021 FINDINGS: The patient is rotated to the right on today's radiograph, reducing diagnostic sensitivity and specificity. Mild enlargement of the cardiopericardial silhouette noted. Atherosclerotic calcification of the aortic arch. Mild prominence of upper zone lung markings suggesting cephalization of blood flow and pulmonary venous hypertension. No overt pulmonary edema. No blunting of the costophrenic angles. Left subclavian/axillary stent noted. IMPRESSION: 1. Mild enlargement of the cardiopericardial silhouette with cephalization of blood flow and pulmonary venous hypertension but no overt edema. 2. Left subclavian/axillary stent noted. 3. Atherosclerotic calcification of the aortic arch. Aortic Atherosclerosis (ICD10-I70.0). Electronically Signed   By: Van Clines M.D.   On: 07/12/2022 17:25   CT HEAD WO CONTRAST  Result Date: 07/12/2022 CLINICAL DATA:  82 year old male with near syncope and slurred speech at dialysis. Vomiting. EXAM: CT HEAD WITHOUT CONTRAST TECHNIQUE: Contiguous axial images were obtained from the base of the skull through the vertex without intravenous contrast. RADIATION DOSE REDUCTION: This exam was performed according to the departmental dose-optimization program which includes automated exposure control, adjustment of the mA and/or kV according to patient size and/or use of iterative reconstruction technique. COMPARISON:  Brain MRI and head CT 07/09/2022. FINDINGS:  Brain: No midline shift, mass effect, or evidence of intracranial mass lesion. No ventriculomegaly. No acute intracranial hemorrhage identified. No cortically based acute infarct identified. Stable gray-white differentiation with fairly advanced patchy bilateral white matter hypodensity, heterogeneity in the left thalamus and brainstem, central cerebellum. Vascular: Extensive Calcified atherosclerosis at the skull base. No suspicious intracranial vascular hyperdensity. Skull: No acute osseous abnormality identified. Sinuses/Orbits: Visualized paranasal sinuses are stable and  well aerated. Right mastoid effusion is stable. Other: No acute orbit or scalp soft tissue finding. Calcified scalp vessel atherosclerosis. IMPRESSION: 1. No acute intracranial abnormality. Stable non contrast CT appearance of advanced small vessel disease. 2. Stable right mastoid effusion. Electronically Signed   By: Genevie Ann M.D.   On: 07/12/2022 10:58   MR BRAIN WO CONTRAST  Result Date: 07/09/2022 CLINICAL DATA:  Slurred speech. EXAM: MRI HEAD WITHOUT CONTRAST TECHNIQUE: Multiplanar, multiecho pulse sequences of the brain and surrounding structures were obtained without intravenous contrast. COMPARISON:  Same-day CT head FINDINGS: Brain: There is no acute intracranial hemorrhage, extra-axial fluid collection, or acute infarct. Parenchymal volume is within expected limits for age. The ventricles are normal in size. Gray-white differentiation is preserved. Patchy FLAIR signal abnormality in the supratentorial white matter likely reflects sequela of moderate chronic small-vessel ischemic change. There is a remote infarct in the right parietal white matter and small remote infarcts in the bilateral cerebellar hemispheres and brainstem. There is a probable prominent perivascular space in the left basal ganglia. The pituitary and suprasellar region are normal. There is no mass lesion. There is no mass effect or midline shift. Vascular: Normal  flow voids. Skull and upper cervical spine: Normal marrow signal. Sinuses/Orbits: There is mild mucosal thickening in the paranasal sinuses. Bilateral lens implants are in place. The globes and orbits are otherwise unremarkable. Other: There is a right mastoid effusion, nonspecific. The imaged right nasopharynx is unremarkable. IMPRESSION: 1. No acute intracranial pathology. 2. Multiple small remote lacunar infarcts and background chronic small-vessel ischemic change as above. Electronically Signed   By: Valetta Mole M.D.   On: 07/09/2022 17:50   CT HEAD WO CONTRAST  Result Date: 07/09/2022 CLINICAL DATA:  Transient ischemic attack (TIA) EXAM: CT HEAD WITHOUT CONTRAST TECHNIQUE: Contiguous axial images were obtained from the base of the skull through the vertex without intravenous contrast. RADIATION DOSE REDUCTION: This exam was performed according to the departmental dose-optimization program which includes automated exposure control, adjustment of the mA and/or kV according to patient size and/or use of iterative reconstruction technique. COMPARISON:  01/28/2011 FINDINGS: Brain: No evidence of acute infarction, hemorrhage, hydrocephalus, extra-axial collection or mass lesion/mass effect. Patchy low-density changes within the periventricular and subcortical white matter compatible with chronic microvascular ischemic change. Mild age-related cerebral volume loss. Vascular: Atherosclerotic calcifications involving the large vessels of the skull base. No unexpected hyperdense vessel. Skull: Normal. Negative for fracture or focal lesion. Sinuses/Orbits: Partial right mastoid effusion. Other: None. IMPRESSION: 1. No acute intracranial findings. 2. Chronic microvascular ischemic change and cerebral volume loss. 3. Partial right mastoid effusion. Electronically Signed   By: Davina Poke D.O.   On: 07/09/2022 13:27    Microbiology: Results for orders placed or performed during the hospital encounter of  07/12/22  Resp panel by RT-PCR (RSV, Flu A&B, Covid) Anterior Nasal Swab     Status: None   Collection Time: 07/12/22  4:31 PM   Specimen: Anterior Nasal Swab  Result Value Ref Range Status   SARS Coronavirus 2 by RT PCR NEGATIVE NEGATIVE Final    Comment: (NOTE) SARS-CoV-2 target nucleic acids are NOT DETECTED.  The SARS-CoV-2 RNA is generally detectable in upper respiratory specimens during the acute phase of infection. The lowest concentration of SARS-CoV-2 viral copies this assay can detect is 138 copies/mL. A negative result does not preclude SARS-Cov-2 infection and should not be used as the sole basis for treatment or other patient management decisions. A negative result may occur with  improper specimen collection/handling, submission of specimen other than nasopharyngeal swab, presence of viral mutation(s) within the areas targeted by this assay, and inadequate number of viral copies(<138 copies/mL). A negative result must be combined with clinical observations, patient history, and epidemiological information. The expected result is Negative.  Fact Sheet for Patients:  EntrepreneurPulse.com.au  Fact Sheet for Healthcare Providers:  IncredibleEmployment.be  This test is no t yet approved or cleared by the [place] FDA and  has been authorized for detection and/or diagnosis of SARS-CoV-2 by FDA under an Emergency Use Authorization (EUA). This EUA will remain  in effect (meaning this test can be used) for the duration of the COVID-19 declaration under Section 564(b)(1) of the Act, 21 U.S.C.section 360bbb-3(b)(1), unless the authorization is terminated  or revoked sooner.       Influenza A by PCR NEGATIVE NEGATIVE Final   Influenza B by PCR NEGATIVE NEGATIVE Final    Comment: (NOTE) The Xpert Xpress SARS-CoV-2/FLU/RSV plus assay is intended as an aid in the diagnosis of influenza from Nasopharyngeal swab specimens and should not  be used as a sole basis for treatment. Nasal washings and aspirates are unacceptable for Xpert Xpress SARS-CoV-2/FLU/RSV testing.  Fact Sheet for Patients: EntrepreneurPulse.com.au  Fact Sheet for Healthcare Providers: IncredibleEmployment.be  This test is not yet approved or cleared by the [place] FDA and has been authorized for detection and/or diagnosis of SARS-CoV-2 by FDA under an Emergency Use Authorization (EUA). This EUA will remain in effect (meaning this test can be used) for the duration of the COVID-19 declaration under Section 564(b)(1) of the Act, 21 U.S.C. section 360bbb-3(b)(1), unless the authorization is terminated or revoked.     Resp Syncytial Virus by PCR NEGATIVE NEGATIVE Final    Comment: (NOTE) Fact Sheet for Patients: EntrepreneurPulse.com.au  Fact Sheet for Healthcare Providers: IncredibleEmployment.be  This test is not yet approved or cleared by the [place] FDA and has been authorized for detection and/or diagnosis of SARS-CoV-2 by FDA under an Emergency Use Authorization (EUA). This EUA will remain in effect (meaning this test can be used) for the duration of the COVID-19 declaration under Section 564(b)(1) of the Act, 21 U.S.C. section 360bbb-3(b)(1), unless the authorization is terminated or revoked.  Performed at [facility], [address] [place], [place] [address]    Labs: CBC: Recent Labs  Lab 07/12/22 1041 07/12/22 2358 07/13/22 0918 07/14/22 0620 07/15/22 0451  WBC 14.3* 15.2* 14.1* 14.2* 15.3*  NEUTROABS  --   --  1.8 2.0 2.0  HGB 13.2 12.7* 13.5 13.5 13.2  HCT 39.5 38.9* 40.7 40.2 38.8*  MCV 92.1 93.3 92.3 92.0 90.7  PLT 111* 111* 108* 101* 267*   Basic Metabolic Panel: Recent Labs  Lab 07/12/22 1041 07/12/22 2358 07/13/22 0918 07/14/22 0620 07/15/22 0451  NA 142  --  141 142 140  K 3.7  --  4.3 4.6 4.2  CL 101  --  102  104 99  CO2 30  --  _0 GLUCOSE 81  --  113* 76 71  BUN 26*  --  37* 55* 44*  CREATININE 4.98* 6.77* 7.21* 8.94* 6.76*  CALCIUM 8.6*  --  8.9 8.9 9.1  MG  --   --  2.1 2.3 2.0  PHOS  --   --  6.8* 7.8* 5.9*   Liver Function Tests: Recent Labs  Lab 07/13/22 0918 07/14/22 0620 07/15/22 0451  AST _1 ALT _2 ALKPHOS 78  75 69  BILITOT 0.8 1.0 0.9  PROT 5.9* 5.9* 5.6*  ALBUMIN 3.4* 3.3* 3.3*   CBG: Recent Labs  Lab 07/14/22 0735 07/14/22 1357 07/15/22 0752 07/15/22 0823 07/15/22 1158  GLUCAP 82 86 67* 91 101*   Discharge time spent: greater than 30 minutes.  Signed: Raiford Noble, DO [facility] 07/18/2022

## 2022-11-16 ENCOUNTER — Ambulatory Visit: Payer: Medicare HMO | Admitting: Infectious Diseases

## 2022-12-19 ENCOUNTER — Ambulatory Visit: Payer: Medicare HMO | Attending: Infectious Diseases | Admitting: Infectious Diseases

## 2022-12-19 ENCOUNTER — Encounter: Payer: Self-pay | Admitting: Infectious Diseases

## 2022-12-19 VITALS — BP 168/74 | HR 84 | Temp 97.8°F | Ht 73.0 in | Wt 200.0 lb

## 2022-12-19 DIAGNOSIS — B967 Clostridium perfringens [C. perfringens] as the cause of diseases classified elsewhere: Secondary | ICD-10-CM

## 2022-12-19 DIAGNOSIS — I120 Hypertensive chronic kidney disease with stage 5 chronic kidney disease or end stage renal disease: Secondary | ICD-10-CM | POA: Diagnosis not present

## 2022-12-19 DIAGNOSIS — N186 End stage renal disease: Secondary | ICD-10-CM | POA: Insufficient documentation

## 2022-12-19 DIAGNOSIS — C9110 Chronic lymphocytic leukemia of B-cell type not having achieved remission: Secondary | ICD-10-CM

## 2022-12-19 DIAGNOSIS — Z79899 Other long term (current) drug therapy: Secondary | ICD-10-CM | POA: Diagnosis not present

## 2022-12-19 DIAGNOSIS — E1122 Type 2 diabetes mellitus with diabetic chronic kidney disease: Secondary | ICD-10-CM | POA: Insufficient documentation

## 2022-12-19 DIAGNOSIS — Z5189 Encounter for other specified aftercare: Secondary | ICD-10-CM | POA: Diagnosis present

## 2022-12-19 DIAGNOSIS — D649 Anemia, unspecified: Secondary | ICD-10-CM | POA: Diagnosis not present

## 2022-12-19 DIAGNOSIS — Z992 Dependence on renal dialysis: Secondary | ICD-10-CM | POA: Insufficient documentation

## 2022-12-19 DIAGNOSIS — Z955 Presence of coronary angioplasty implant and graft: Secondary | ICD-10-CM | POA: Diagnosis not present

## 2022-12-19 DIAGNOSIS — Z8673 Personal history of transient ischemic attack (TIA), and cerebral infarction without residual deficits: Secondary | ICD-10-CM | POA: Diagnosis not present

## 2022-12-19 DIAGNOSIS — I776 Arteritis, unspecified: Secondary | ICD-10-CM

## 2022-12-19 DIAGNOSIS — Z7982 Long term (current) use of aspirin: Secondary | ICD-10-CM | POA: Insufficient documentation

## 2022-12-19 MED ORDER — AMOXICILLIN-POT CLAVULANATE 500-125 MG PO TABS: 1.0000 | ORAL_TABLET | Freq: Every day | ORAL | 12 refills | Status: DC

## 2022-12-19 NOTE — Progress Notes (Signed)
NAME: Jonathan Mcgrath  DOB: 17-Jan-1941  MRN: [id number]  Date/Time: 12/19/2022 9:29 AM  Subjective:   Follow-up visit for otitis due to Clostridium septicum. Last 1 was a video visit and 2023  ? MANSFIELD COSGROVE is a 82 y.o. 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 He is  on indefinite antibiotic therapy for the aortitis with PO augmentin. 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   patient was several hours out of the tPA window .  He was initially given dual antiplatelet therapy and now on aspirin. He is being followed by nephrology.  I do not see any note in Care Everywhere or epic that he has seen neurology.  He is also not seen his PCP since the stroke Patient states he is doing fine He drove to the visit today He does not have any fever or chills No abdominal pain No diarrhea He goes for dialysis regularly He takes Augmentin 5 mg 1 tablet a day regularly. In 2022 he had fracture of the right patella and left olecranon.  He has been using her rollator since then. Past Medical History:  Diagnosis Date   Cancer (HCC) 2019   colon   Chronic kidney disease    Diabetes mellitus without complication (HCC)    diet controlled   Hemodialysis access site with arteriovenous graft (HCC)    Hypertension    Infection due to Clostridium septicum (HCC) 08/27/2018   Renal insufficiency    Pt on dialysis x 3 years and receives every Monday, Wednesday and Friday.   CLL   Past Surgical History:  Procedure Laterality Date   AV FISTULA PLACEMENT Left 2015    arm   CATARACT EXTRACTION W/ INTRAOCULAR LENS  IMPLANT, BILATERAL Bilateral 08/2017   CENTRAL LINE INSERTION-TUNNELED N/A 04/19/2018   Procedure: CENTRAL LINE INSERTION-TUNNELED;  Surgeon: Renford Dills, MD;  Location: [facility];  Service: Cardiovascular;  Laterality: N/A;   CENTRAL VENOUS CATHETER INSERTION  07/15/2018   Procedure: Insertion Hickman Catheter;  Surgeon: Annice Needy, MD;  Location: [facility];  Service: Cardiovascular;;   COLON RESECTION N/A 05/16/2018   Procedure: LAPAROSCOPIC  COLON RESECTION;  Surgeon: Carolan Shiver, MD;  Location: [facility];  Service: General;  Laterality: N/A;   COLONOSCOPY N/A 04/18/2018   Procedure: COLONOSCOPY;  Surgeon: Pasty Spillers, MD;  Location: [facility];  Service: Endoscopy;  Laterality: N/A;   COLONOSCOPY WITH PROPOFOL N/A 05/22/2019   Procedure: COLONOSCOPY WITH PROPOFOL;  Surgeon: Pasty Spillers, MD;  Location: [facility];  Service: Endoscopy;  Laterality: N/A;   DIALYSIS/PERMA CATHETER REMOVAL N/A 06/19/2018   Procedure: DIALYSIS/PERMA CATHETER REMOVAL;  Surgeon: Annice Needy, MD;  Location: [facility];  Service: Cardiovascular;  Laterality: N/A;   DIALYSIS/PERMA CATHETER REMOVAL N/A 09/24/2018   Procedure: DIALYSIS/PERMA CATHETER REMOVAL;  Surgeon: Renford Dills, MD;  Location: [facility];  Service: Cardiovascular;  Laterality: N/A;   ESOPHAGOGASTRODUODENOSCOPY N/A 04/18/2018   Procedure: ESOPHAGOGASTRODUODENOSCOPY (EGD);  Surgeon: Pasty Spillers, MD;  Location: [facility];  Service: Endoscopy;  Laterality: N/A;   EYE SURGERY  PELVIC ANGIOGRAPHY  07/15/2018   Procedure: PELVIC ANGIOGRAPHY;  Surgeon: Renford Dills, MD;  Location: [facility];  Service: Cardiovascular;;   PERIPHERAL VASCULAR BALLOON ANGIOPLASTY Bilateral 07/15/2018   Procedure: PERIPHERAL VASCULAR BALLOON ANGIOPLASTY;  Surgeon: Annice Needy, MD;  Location: [facility];   Service: Cardiovascular;  Laterality: Bilateral;   PERIPHERAL VASCULAR CATHETERIZATION Left 01/12/2016   Procedure: A/V Shuntogram/Fistulagram;  Surgeon: Annice Needy, MD;  Location: [facility];  Service: Cardiovascular;  Laterality: Left;   PERIPHERAL VASCULAR CATHETERIZATION N/A 01/12/2016   Procedure: A/V Shunt Intervention;  Surgeon: Annice Needy, MD;  Location: [facility];  Service: Cardiovascular;  Laterality: N/A;   PERITONEAL CATHETER INSERTION N/A    REMOVAL OF A DIALYSIS CATHETER N/A 01/14/2015   Procedure: REMOVAL OF A  PERITONEAL DIALYSIS CATHETER;  Surgeon: Annice Needy, MD;  Location: [facility];  Service: Vascular;  Laterality: N/A;   TEE WITHOUT CARDIOVERSION N/A 04/19/2018   Procedure: TRANSESOPHAGEAL ECHOCARDIOGRAM (TEE);  Surgeon: Dalia Heading, MD;  Location: [facility];  Service: Cardiovascular;  Laterality: N/A;    Social History   Socioeconomic History   Marital status: Widowed    Spouse name: Not on file   Number of children: 4   Years of education: Not on file   Highest education level: Not on file  Occupational History   Occupation: retired  Tobacco Use   Smoking status: Never   Smokeless tobacco: Never  Vaping Use   Vaping Use: Never used  Substance and Sexual Activity   Alcohol use: No   Drug use: No   Sexual activity: Not Currently  Other Topics Concern   Not on file  Social History Narrative   Not on file   Social Determinants of Health   Financial Resource Strain: Not on file  Food Insecurity: Not on file  Transportation Needs: Not on file  Physical Activity: Not on file  Stress: Not on file  Social Connections: Not on file  Intimate Partner Violence: Not on file    Family History  Problem Relation Age of Onset   Diabetes Mother    Heart disease Father    No Known Allergies  ? Current Outpatient Medications  Medication Sig Dispense Refill   acetaminophen (TYLENOL) 325 MG tablet Take 2 tablets (650 mg total) by mouth  every 4 (four) hours as needed for mild pain (or temp > 37.5 C (99.5 F)). 20 tablet 0   aspirin EC 81 MG tablet Take 1 tablet (81 mg total) by mouth daily. Swallow whole. 30 tablet 12   atorvastatin (LIPITOR) 40 MG tablet Take 1 tablet (40 mg total) by mouth daily. 30 tablet 1   B Complex-C-Folic Acid (RENA-VITE RX) 1 MG TABS Take 1 tablet by mouth daily.     cinacalcet (SENSIPAR) 60 MG tablet Take 60 mg by mouth daily.     Ergocalciferol (VITAMIN D2) 10 MCG (400 UNIT) TABS Take 1 tab daily 30 tablet 0   famotidine (PEPCID) 20 MG tablet Take 20 mg by mouth 2 (two) times daily. 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     ferric citrate (AURYXIA) 1 GM 210 MG(Fe) tablet Take 210 mg by mouth 3 (three) times daily with meals.     multivitamin (RENA-VIT) TABS tablet Take 1 tablet by mouth daily. 30 tablet 11   ondansetron (ZOFRAN) 4 MG tablet Take 4 mg by mouth 3 (three) times daily as needed for nausea/vomiting.     senna-docusate (  SENOKOT-S) 8.6-50 MG tablet Take 1 tablet by mouth 2 (two) times daily. 30 tablet 0   amoxicillin-clavulanate (AUGMENTIN) 500-125 MG tablet Take 1 tablet by mouth daily. 30 tablet 12   No current facility-administered medications for this visit.     Abtx:  Anti-infectives (From admission, onward)    Start     Dose/Rate Route Frequency Ordered Stop   12/19/22 0000  amoxicillin-clavulanate (AUGMENTIN) 500-125 MG tablet        1 tablet Oral Daily 12/19/22 0927         REVIEW OF SYSTEMS:  Const: negative fever, negative chills, negative weight loss Eyes: negative diplopia or visual changes, negative eye pain ENT: negative coryza, negative sore throat, hard of hearing Resp: negative cough, hemoptysis, dyspnea Cards: negative for chest pain, palpitations, lower extremity edema GU: negative for frequency, dysuria and hematuria GI: no pain abdomen, no diarrhea, no nausea Skin: negative for rash and pruritus Heme: negative for easy bruising and gum/nose bleeding NW:GNFA  weakness Neurolo:numbness feet  Psych: negative for feelings of anxiety, depression  Endocrine:as above Allergy/Immunology- negative for any medication or food allergies ?  Objective:  VITALS:  BP (!) 168/74   Pulse 84   Temp 97.8 F (36.6 C) (Temporal)   Ht 6\' 1"  (1.854 m)   Wt 200 lb (90.7 kg)   BMI 26.39 kg/m  PHYSICAL EXAM:  General: looks well, no distress, may have some memory issues Head: Normocephalic, without obvious abnormality, atraumatic. Eyes: Conjunctivae clear, anicteric sclerae. Pupils are equal ENT N Neck: Supple, symmetrical, no adenopathy, thyroid: non tender no carotid bruit and no JVD. Back: No CVA tenderness. Lungs: Clear to auscultation bilaterally. No Wheezing or Rhonchi. No rales.  Heart: s1s2, 4/6 systolic murmur aortic area  Abdomen: Soft, non-tender,not distended. Bowel sounds normal. No masses, lap scar Extremities: no edema legs  Skin: No rashes or lesions. Or bruising Lymph: Cervical, supraclavicular normal. Neurologic: Grossly non-focal Pertinent Labs None recently    ? Impression/Recommendation  ESRD on dialysis  Clostridium septicum bacteremia with aortitis  Leading to pseudoaneurysm s/p stents-S/p stents placement in aorta, common iliac, rt and left iliac and external iliac on 07/15/18  all of this  secondary to adenocarcinoma of the caecum Pt on indefinite PO augmentin 500mg  once a day after a prolonged course of IV unasyn  in 2019 and early 2020 Annual crp/esr being checked Discussed the importance of being adherent to amox/clav( augmentin) every day. After the visit when I looked at the report his adherence refilling his prescription is only 66%. Called [facility] who he wanted me to send the prescription- asked them to call him to refill meds I have also left a message for his PCP Dr.Roach who had last sent prescription o [facility] pharmacy     s/p rt hemicolectomy and primary anastomosis 05/16/18 for ca cecum Last   colonoscopy 05/22/2019 -a few polyps but no malignancy or recurrence at the anastomosis site- He has not seen GI/surgery after that Says he is too old to go for all these appts  Leucocytosis since 2013- absolute lymphocytosis- has a diagnosis of CLL (cannot see those notes in Epic as it was in 2014)  follow up with heme/onc  Thrombocytopenia since 2013 likely due to the above Fluctuates- when he had the aortitis it had normalized due to the infection  Anemia -     Continue augmentin 500mg  once a day indefinitely  ___________________________________________________ Discussed with patient Follow up 12 months

## 2022-12-19 NOTE — Patient Instructions (Addendum)
Today you are here for a check up for the infection you had in your aorta. You will continue to take augmentin 500mg  one tablet a day indefinitely because of stents and grafts- follow up 1 year. Labs next visit as requested by him

## 2022-12-21 ENCOUNTER — Encounter: Payer: Self-pay | Admitting: Physician Assistant

## 2022-12-21 ENCOUNTER — Ambulatory Visit (INDEPENDENT_AMBULATORY_CARE_PROVIDER_SITE_OTHER): Payer: Medicare HMO | Admitting: Physician Assistant

## 2022-12-21 VITALS — BP 164/71 | HR 82 | Temp 97.7°F | Ht 73.0 in | Wt 196.6 lb

## 2022-12-21 DIAGNOSIS — N186 End stage renal disease: Secondary | ICD-10-CM | POA: Diagnosis not present

## 2022-12-21 DIAGNOSIS — Z8673 Personal history of transient ischemic attack (TIA), and cerebral infarction without residual deficits: Secondary | ICD-10-CM | POA: Diagnosis not present

## 2022-12-21 DIAGNOSIS — K5900 Constipation, unspecified: Secondary | ICD-10-CM | POA: Diagnosis not present

## 2022-12-21 DIAGNOSIS — Z800 Family history of malignant neoplasm of digestive organs: Secondary | ICD-10-CM | POA: Diagnosis not present

## 2022-12-21 MED ORDER — PEG 3350-KCL-NA BICARB-NACL 420 G PO SOLR: 8000.0000 mL | Freq: Once | ORAL | 0 refills | Status: AC

## 2022-12-21 MED ORDER — POLYETHYLENE GLYCOL 3350 17 GM/SCOOP PO POWD: ORAL | 0 refills | Status: DC

## 2022-12-21 NOTE — Patient Instructions (Signed)
Start OTC Miralax Powder.  Mix 1 capful in a drink once daily for Constipation.

## 2022-12-21 NOTE — Progress Notes (Signed)
Celso Amy, PA-C [address]  [address]  [place], [place] [address]  Main: [phone]  Fax: [phone]   Gastroenterology Consultation  Referring Provider:     Estell Harpin, MD Primary Care Physician:  Estell Harpin, MD Primary Gastroenterologist:  Celso Amy, PA-C / Dr. Wyline Mood  Reason for Consultation:     F/upU Hx of Colon Cancer; Chronic Constipation        HPI:   Jonathan Mcgrath is a 82 y.o. y/o male referred for consultation & management  by Estell Harpin, MD.    Previous patient of Dr. Maximino Greenland.  Underwent colonoscopy in 2019 which showed colon cancer in the cecum and ascending colon.    He underwent laparoscopic right extended hemicolectomy with primary anastomosis 04/2018 by Dr. Tonna Boehringer.  Last surveillance colonoscopy done by Dr. Maximino Greenland 05/2019 showed fair prep, 4 small tubular adenoma polyps removed, internal hemorrhoids.  Repeat colonoscopy was recommended in 2 years with 2-day prep.  History significant for end-stage renal disease on hemodialysis, hypertension, and diabetes.  Takes Senokot for constipation and famotidine for GERD.  Had acute CVA which required hospitalization 06/2022.  History of CLL.  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  Pseudoaneurysm of aorta status post stents.  He is on indefinite antibiotic therapy for aortitis with oral Augmentin.  Currently on aspirin.  No other blood thinners.  Current Symptoms: Patient is somewhat confused today.  Poor memory.  Poor historian.  Walks with a cane.  He has mild constipation.  Has bowel movement daily or every other day.  Takes OTC stool softener.  He is asking to try MiraLAX.  He denies rectal bleeding, abdominal pain, or unintentional weight loss.  He attends dialysis Monday Wednesday Friday.  He does not want to have any more colonoscopies done.  He states his doctor told him he did not have to do any more colonoscopies.   Past Medical History:  Diagnosis Date   Cancer (HCC)  2019   colon   Chronic kidney disease    Diabetes mellitus without complication (HCC)    diet controlled   Hemodialysis access site with arteriovenous graft (HCC)    Hypertension    Infection due to Clostridium septicum (HCC) 08/27/2018   Renal insufficiency    Pt on dialysis x 3 years and receives every Monday, Wednesday and Friday.     Past Surgical History:  Procedure Laterality Date   AV FISTULA PLACEMENT Left 2015   arm   CATARACT EXTRACTION W/ INTRAOCULAR LENS  IMPLANT, BILATERAL Bilateral 08/2017   CENTRAL LINE INSERTION-TUNNELED N/A 04/19/2018   Procedure: CENTRAL LINE INSERTION-TUNNELED;  Surgeon: Renford Dills, MD;  Location: [facility] INVASIVE CV [facility];  Service: Cardiovascular;  Laterality: N/A;   CENTRAL VENOUS CATHETER INSERTION  07/15/2018   Procedure: Insertion Hickman Catheter;  Surgeon: Annice Needy, MD;  Location: [facility];  Service: Cardiovascular;;   COLON RESECTION N/A 05/16/2018   Procedure: LAPAROSCOPIC  COLON RESECTION;  Surgeon: Carolan Shiver, MD;  Location: [facility] OR[facility];  Service: General;  Laterality: N/A;   COLONOSCOPY N/A 04/18/2018   Procedure: COLONOSCOPY;  Surgeon: Pasty Spillers, MD;  Location: [facility] ENDOSCOPY;  Service: Endoscopy;  Laterality: N/A;   COLONOSCOPY WITH PROPOFOL N/A 05/22/2019   Procedure: COLONOSCOPY WITH PROPOFOL;  Surgeon: Pasty Spillers, MD;  Location: [facility] ENDOSCOPY;  Service: Endoscopy;  Laterality: N/A;   DIALYSIS/PERMA CATHETER REMOVAL N/A 06/19/2018   Procedure: DIALYSIS/PERMA CATHETER REMOVAL;  Surgeon: Annice Needy, MD;  Location: [facility]  INVASIVE CV LAB;  Service: Cardiovascular;  Laterality: N/A;   DIALYSIS/PERMA CATHETER REMOVAL N/A 09/24/2018   Procedure: DIALYSIS/PERMA CATHETER REMOVAL;  Surgeon: Renford Dills, MD;  Location: [facility] IN[facility];  Service: Cardiovascular;  Laterality: N/A;   ESOPHAGOGASTRODUODENOSCOPY N/A 04/18/2018   Procedure: ESOPHAGOGASTRODUODENOSCOPY (EGD);  Surgeon: Pasty Spillers, MD;  Location: [facility] ENDOSCOPY;  Service: Endoscopy;  Laterality: N/A;   EYE SURGERY     PELVIC ANGIOGRAPHY  07/15/2018   Procedure: PELVIC ANGIOGRAPHY;  Surgeon: Renford Dills, MD;  Location: [facility] INV[facility] CV [facility];  Service: Cardiovascular;;   PERIPHERAL VASCULAR BALLOON ANGIOPLASTY Bilateral 07/15/2018   Procedure: PERIPHERAL VASCULAR BALLOON ANGIOPLASTY;  Surgeon: Annice Needy, MD;  Location: [facility] IN[facility];  Service: Cardiovascular;  Laterality: Bilateral;   PERIPHERAL VASCULAR CATHETERIZATION Left 01/12/2016   Procedure: A/V Shuntogram/Fistulagram;  Surgeon: Annice Needy, MD;  Location: [facility] IN[facility];  Service: Cardiovascular;  Laterality: Left;   PERIPHERAL VASCULAR CATHETERIZATION N/A 01/12/2016   Procedure: A/V Shunt Intervention;  Surgeon: Annice Needy, MD;  Location: [facility] INV[facility];  Service: Cardiovascular;  Laterality: N/A;   PERITONEAL CATHETER INSERTION N/A    REMOVAL OF A DIALYSIS CATHETER N/A 01/14/2015   Procedure: REMOVAL OF A  PERITONEAL DIALYSIS CATHETER;  Surgeon: Annice Needy, MD;  Location: [facility] ORS;  Service: Vascular;  Laterality: N/A;   TEE WITHOUT CARDIOVERSION N/A 04/19/2018   Procedure: TRANSESOPHAGEAL ECHOCARDIOGRAM (TEE);  Surgeon: Dalia Heading, MD;  Location: [facility] ORS;  Service: Cardiovascular;  Laterality: N/A;    Prior to Admission medications   Medication Sig Start Date End Date Taking? Authorizing Provider  acetaminophen (TYLENOL) 325 MG tablet Take 2 tablets (650 mg total) by mouth every 4 (four) hours as needed for mild pain (or temp > 37.5 C (99.5 F)). 07/15/22   Marguerita Merles Latif, DO  amoxicillin-clavulanate (AUGMENTIN) 500-125 MG tablet Take 1 tablet by mouth daily. 12/19/22   Lynn Ito, MD  aspirin EC 81 MG tablet Take 1 tablet (81 mg total) by mouth daily. Swallow whole. 07/16/22   Marguerita Merles Latif, DO  atorvastatin (LIPITOR) 40 MG tablet Take 1 tablet (40 mg total) by mouth daily. 07/16/22   Sheikh, Kateri Mc  Latif, DO  B Complex-C-Folic Acid (RENA-VITE RX) 1 MG TABS Take 1 tablet by mouth daily. 11/02/21   [provider]  cinacalcet (SENSIPAR) 60 MG tablet Take 60 mg by mouth daily. 03/21/22   [provider]  Ergocalciferol (VITAMIN D2) 10 MCG (400 UNIT) TABS Take 1 tab daily 01/16/22   Enedina Finner, MD  famotidine (PEPCID) 20 MG tablet Take 20 mg by mouth 2 (two) times daily. 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    [provider]  ferric citrate (AURYXIA) 1 GM 210 MG(Fe) tablet Take 210 mg by mouth 3 (three) times daily with meals.    [provider]  multivitamin (RENA-VIT) TABS tablet Take 1 tablet by mouth daily. 01/16/22   Enedina Finner, MD  ondansetron (ZOFRAN) 4 MG tablet Take 4 mg by mouth 3 (three) times daily as needed for nausea/vomiting. 06/07/18   [provider]  senna-docusate (SENOKOT-S) 8.6-50 MG tablet Take 1 tablet by mouth 2 (two) times daily. 01/16/22   Enedina Finner, MD    Family History  Problem Relation Age of Onset   Diabetes Mother    Heart disease Father      Social History   Tobacco Use   Smoking status: Never   Smokeless tobacco: Never  Vaping Use   Vaping Use: Never used  Substance Use Topics   Alcohol use: No   Drug use: No    Allergies as of 12/21/2022   (No Known Allergies)    Review of Systems:    All systems reviewed and negative except where noted in HPI.   Physical Exam:  BP (!) 164/71   Pulse 82   Temp 97.7 F (36.5 C)   Ht 6\' 1"  (1.854 m)   Wt 196 lb 9.6 oz (89.2 kg)   BMI 25.94 kg/m  No LMP for male patient. Psych:  Alert and cooperative. Normal mood and affect. General:   Feeble, elderly male, well-nourished, pleasant and cooperative in NAD Head:  Normocephalic and atraumatic. Eyes:  Sclera clear, no icterus.   Conjunctiva pink. Neck:  Supple; no masses or thyromegaly. Lungs:  Respirations even and unlabored.  Clear throughout to auscultation.   No wheezes, crackles, or rhonchi. No acute  distress. Heart:  Regular rate and rhythm; no murmurs, clicks, rubs, or gallops. Abdomen:  Normal bowel sounds.  No bruits.  Soft, and non-distended without masses, hepatosplenomegaly or hernias noted.  No Tenderness.  No guarding or rebound tenderness.    Neurologic:  Alert and oriented x3;  grossly normal neurologically.  Walks with a cane.  Confused.  Poor historian. Psych:  Alert and cooperative. Normal mood and affect.  Imaging Studies: No results found.  Assessment and Plan:   Jonathan Mcgrath is a 82 y.o. y/o male has been referred for:  History of Colon cCncer and History of Adenomatous colon polyps  I recommended for patient to schedule a colonoscopy today. I discussed risks of colonoscopy with patient to include risk of bleeding, colon perforation, and risk of sedation.  Patient expressed understanding. He would require extra prep:**2 Day Prep; 2 GoLytelys. He would need procedure on a Tuesday or Thursday. Patient declined to schedule a colonoscopy today.  He needs time to think about the procedure. Patient will call back if he decides to schedule colonoscopy at a later date. He would be at increased risk of complication from a procedure given his advanced age and comorbidities.  2.   Chronic Constipation  Discussed constipation treatment at length. Recommend High Fiber diet with fruits, vegetables, and whole grains. Drink 64 ounces of Fluids Daily. Start Miralax Mix 1 capful in a drink daily.  Continue OTC Senokot if needed.  3.   ESRD on dialysis - MWF 4.   Chronic Augmentin antibiotic for aortitis 5.   History of CVA, currently on aspirin  Follow up in 2 months with Dr. Tobi Bastos to reevaluate for colonoscopy and follow-up constipation.  Celso Amy, PA-C

## 2023-02-20 ENCOUNTER — Ambulatory Visit: Payer: Medicare HMO | Admitting: Physician Assistant

## 2023-04-09 ENCOUNTER — Inpatient Hospital Stay
Admission: EM | Admit: 2023-04-09 | Discharge: 2023-04-10 | DRG: 280 | Disposition: A | Discharge status: Other Acute Inpatient Hospital | Payer: Medicare HMO | Attending: Internal Medicine | Admitting: Internal Medicine

## 2023-04-09 ENCOUNTER — Emergency Department: Payer: Medicare HMO

## 2023-04-09 ENCOUNTER — Other Ambulatory Visit: Payer: Self-pay

## 2023-04-09 DIAGNOSIS — D631 Anemia in chronic kidney disease: Secondary | ICD-10-CM | POA: Diagnosis present

## 2023-04-09 DIAGNOSIS — I440 Atrioventricular block, first degree: Secondary | ICD-10-CM | POA: Diagnosis present

## 2023-04-09 DIAGNOSIS — Z1152 Encounter for screening for COVID-19: Secondary | ICD-10-CM

## 2023-04-09 DIAGNOSIS — Z7982 Long term (current) use of aspirin: Secondary | ICD-10-CM

## 2023-04-09 DIAGNOSIS — I081 Rheumatic disorders of both mitral and tricuspid valves: Secondary | ICD-10-CM | POA: Diagnosis present

## 2023-04-09 DIAGNOSIS — I3139 Other pericardial effusion (noninflammatory): Secondary | ICD-10-CM | POA: Diagnosis present

## 2023-04-09 DIAGNOSIS — Z961 Presence of intraocular lens: Secondary | ICD-10-CM | POA: Diagnosis present

## 2023-04-09 DIAGNOSIS — N186 End stage renal disease: Secondary | ICD-10-CM | POA: Diagnosis not present

## 2023-04-09 DIAGNOSIS — N2581 Secondary hyperparathyroidism of renal origin: Secondary | ICD-10-CM | POA: Diagnosis present

## 2023-04-09 DIAGNOSIS — R531 Weakness: Secondary | ICD-10-CM | POA: Diagnosis not present

## 2023-04-09 DIAGNOSIS — Y838 Other surgical procedures as the cause of abnormal reaction of the patient, or of later complication, without mention of misadventure at the time of the procedure: Secondary | ICD-10-CM | POA: Diagnosis present

## 2023-04-09 DIAGNOSIS — E1122 Type 2 diabetes mellitus with diabetic chronic kidney disease: Secondary | ICD-10-CM | POA: Diagnosis present

## 2023-04-09 DIAGNOSIS — Z85038 Personal history of other malignant neoplasm of large intestine: Secondary | ICD-10-CM

## 2023-04-09 DIAGNOSIS — I350 Nonrheumatic aortic (valve) stenosis: Secondary | ICD-10-CM | POA: Diagnosis present

## 2023-04-09 DIAGNOSIS — H4311 Vitreous hemorrhage, right eye: Secondary | ICD-10-CM | POA: Diagnosis present

## 2023-04-09 DIAGNOSIS — Z833 Family history of diabetes mellitus: Secondary | ICD-10-CM

## 2023-04-09 DIAGNOSIS — I10 Essential (primary) hypertension: Secondary | ICD-10-CM | POA: Diagnosis present

## 2023-04-09 DIAGNOSIS — Z8673 Personal history of transient ischemic attack (TIA), and cerebral infarction without residual deficits: Secondary | ICD-10-CM

## 2023-04-09 DIAGNOSIS — I776 Arteritis, unspecified: Secondary | ICD-10-CM

## 2023-04-09 DIAGNOSIS — Z9841 Cataract extraction status, right eye: Secondary | ICD-10-CM

## 2023-04-09 DIAGNOSIS — R7989 Other specified abnormal findings of blood chemistry: Secondary | ICD-10-CM | POA: Diagnosis present

## 2023-04-09 DIAGNOSIS — T82856A Stenosis of peripheral vascular stent, initial encounter: Secondary | ICD-10-CM | POA: Diagnosis present

## 2023-04-09 DIAGNOSIS — E782 Mixed hyperlipidemia: Secondary | ICD-10-CM | POA: Diagnosis present

## 2023-04-09 DIAGNOSIS — I214 Non-ST elevation (NSTEMI) myocardial infarction: Secondary | ICD-10-CM | POA: Diagnosis not present

## 2023-04-09 DIAGNOSIS — D696 Thrombocytopenia, unspecified: Secondary | ICD-10-CM | POA: Diagnosis present

## 2023-04-09 DIAGNOSIS — I700 Atherosclerosis of aorta: Secondary | ICD-10-CM | POA: Diagnosis present

## 2023-04-09 DIAGNOSIS — I5032 Chronic diastolic (congestive) heart failure: Secondary | ICD-10-CM | POA: Diagnosis not present

## 2023-04-09 DIAGNOSIS — Z79899 Other long term (current) drug therapy: Secondary | ICD-10-CM

## 2023-04-09 DIAGNOSIS — I132 Hypertensive heart and chronic kidney disease with heart failure and with stage 5 chronic kidney disease, or end stage renal disease: Secondary | ICD-10-CM | POA: Diagnosis present

## 2023-04-09 DIAGNOSIS — Z9842 Cataract extraction status, left eye: Secondary | ICD-10-CM

## 2023-04-09 DIAGNOSIS — I5021 Acute systolic (congestive) heart failure: Secondary | ICD-10-CM | POA: Diagnosis present

## 2023-04-09 DIAGNOSIS — I272 Pulmonary hypertension, unspecified: Secondary | ICD-10-CM | POA: Diagnosis present

## 2023-04-09 DIAGNOSIS — E872 Acidosis, unspecified: Secondary | ICD-10-CM | POA: Diagnosis present

## 2023-04-09 DIAGNOSIS — I2510 Atherosclerotic heart disease of native coronary artery without angina pectoris: Secondary | ICD-10-CM | POA: Diagnosis present

## 2023-04-09 DIAGNOSIS — I447 Left bundle-branch block, unspecified: Secondary | ICD-10-CM | POA: Diagnosis present

## 2023-04-09 DIAGNOSIS — Z8249 Family history of ischemic heart disease and other diseases of the circulatory system: Secondary | ICD-10-CM

## 2023-04-09 DIAGNOSIS — Z992 Dependence on renal dialysis: Secondary | ICD-10-CM

## 2023-04-09 DIAGNOSIS — R0902 Hypoxemia: Secondary | ICD-10-CM | POA: Diagnosis present

## 2023-04-09 DIAGNOSIS — E875 Hyperkalemia: Secondary | ICD-10-CM | POA: Diagnosis present

## 2023-04-09 DIAGNOSIS — I639 Cerebral infarction, unspecified: Secondary | ICD-10-CM | POA: Diagnosis present

## 2023-04-09 DIAGNOSIS — Z9049 Acquired absence of other specified parts of digestive tract: Secondary | ICD-10-CM

## 2023-04-09 DIAGNOSIS — Z856 Personal history of leukemia: Secondary | ICD-10-CM

## 2023-04-09 LAB — RESP PANEL BY RT-PCR (RSV, FLU A&B, COVID)  RVPGX2
Influenza A by PCR: NEGATIVE
Influenza B by PCR: NEGATIVE
Resp Syncytial Virus by PCR: NEGATIVE
SARS Coronavirus 2 by RT PCR: NEGATIVE

## 2023-04-09 LAB — LIPID PANEL
Cholesterol: 160 mg/dL (ref 0–200)
HDL: 38 mg/dL — ABNORMAL LOW (ref >40)
LDL Cholesterol: 105 mg/dL — ABNORMAL HIGH (ref 0–99)
Total CHOL/HDL Ratio: 4.2 RATIO
Triglycerides: 85 mg/dL (ref <150)
VLDL: 17 mg/dL (ref 0–40)

## 2023-04-09 LAB — CBC
HCT: 33.4 % — ABNORMAL LOW (ref 39.0–52.0)
Hemoglobin: 10.7 g/dL — ABNORMAL LOW (ref 13.0–17.0)
MCH: 31.5 pg (ref 26.0–34.0)
MCHC: 32 g/dL (ref 30.0–36.0)
MCV: 98.2 fL (ref 80.0–100.0)
Platelets: 85 10*3/uL — ABNORMAL LOW (ref 150–400)
RBC: 3.4 MIL/uL — ABNORMAL LOW (ref 4.22–5.81)
RDW: 14.6 % (ref 11.5–15.5)
WBC: 8.7 10*3/uL (ref 4.0–10.5)
nRBC: 0.3 % — ABNORMAL HIGH (ref 0.0–0.2)

## 2023-04-09 LAB — COMPREHENSIVE METABOLIC PANEL
ALT: 33 U/L (ref 0–44)
AST: 36 U/L (ref 15–41)
Albumin: 3.3 g/dL — ABNORMAL LOW (ref 3.5–5.0)
Alkaline Phosphatase: 72 U/L (ref 38–126)
Anion gap: 15 (ref 5–15)
BUN: 35 mg/dL — ABNORMAL HIGH (ref 8–23)
CO2: 26 mmol/L (ref 22–32)
Calcium: 8.6 mg/dL — ABNORMAL LOW (ref 8.9–10.3)
Chloride: 100 mmol/L (ref 98–111)
Creatinine, Ser: 5.11 mg/dL — ABNORMAL HIGH (ref 0.61–1.24)
GFR, Estimated: 11 mL/min — ABNORMAL LOW (ref >60)
Glucose, Bld: 119 mg/dL — ABNORMAL HIGH (ref 70–99)
Potassium: 4.4 mmol/L (ref 3.5–5.1)
Sodium: 141 mmol/L (ref 135–145)
Total Bilirubin: 1.1 mg/dL (ref 0.3–1.2)
Total Protein: 5.6 g/dL — ABNORMAL LOW (ref 6.5–8.1)

## 2023-04-09 LAB — TROPONIN I (HIGH SENSITIVITY): Troponin I (High Sensitivity): 2621 ng/L (ref <18)

## 2023-04-09 LAB — BRAIN NATRIURETIC PEPTIDE: B Natriuretic Peptide: 3735.5 pg/mL — ABNORMAL HIGH (ref 0.0–100.0)

## 2023-04-09 LAB — PROTIME-INR
INR: 1.3 — ABNORMAL HIGH (ref 0.8–1.2)
Prothrombin Time: 16.4 seconds — ABNORMAL HIGH (ref 11.4–15.2)

## 2023-04-09 LAB — APTT: aPTT: 33 seconds (ref 24–36)

## 2023-04-09 LAB — LACTIC ACID, PLASMA: Lactic Acid, Venous: 2.2 mmol/L (ref 0.5–1.9)

## 2023-04-09 MED ORDER — ACETAMINOPHEN 500 MG PO TABS
1000.0000 mg | ORAL_TABLET | Freq: Once | ORAL | Status: AC
  Administered 2023-04-09: 1000 mg via ORAL
  Filled 2023-04-09: qty 2

## 2023-04-09 MED ORDER — ASPIRIN 81 MG PO CHEW
324.0000 mg | CHEWABLE_TABLET | Freq: Once | ORAL | Status: AC
  Administered 2023-04-09: 324 mg via ORAL
  Filled 2023-04-09: qty 4

## 2023-04-09 MED ORDER — DIPHENHYDRAMINE HCL 50 MG/ML IJ SOLN
12.5000 mg | Freq: Three times a day (TID) | INTRAMUSCULAR | Status: DC | PRN
  Administered 2023-04-10: 12.5 mg via INTRAVENOUS
  Filled 2023-04-09: qty 1

## 2023-04-09 MED ORDER — HEPARIN (PORCINE) 25000 UT/250ML-% IV SOLN
1250.0000 [IU]/h | INTRAVENOUS | Status: DC
  Filled 2023-04-09: qty 250

## 2023-04-09 MED ORDER — HEPARIN BOLUS VIA INFUSION
4000.0000 [IU] | Freq: Once | INTRAVENOUS | Status: DC
  Filled 2023-04-09: qty 4000

## 2023-04-09 MED ORDER — SODIUM CHLORIDE 0.9 % IV BOLUS
500.0000 mL | Freq: Once | INTRAVENOUS | Status: AC
  Administered 2023-04-09: 500 mL via INTRAVENOUS

## 2023-04-09 MED ORDER — ACETAMINOPHEN 325 MG PO TABS: 650.0000 mg | ORAL_TABLET | Freq: Four times a day (QID) | ORAL | Status: DC | PRN

## 2023-04-09 NOTE — ED Notes (Signed)
Pt placed on 2l of o2 via [place] for low o2 sat in the upper 80's.

## 2023-04-09 NOTE — H&P (Signed)
History and Physical    Jonathan Mcgrath UEA:[id number] DOB: [birth year] DOA: 04/09/2023  Referring MD/NP/PA:   PCP: Estell Harpin, MD   Patient coming from:  The patient is coming from home.     Chief Complaint: weakness  HPI: Jonathan Mcgrath is a 82 y.o. 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  Pt states that he has not been feeling well in the past 3 days.  He has  generalized weakness and fatigue. He had dialysis today, but due to low blood pressure, patient only had 2 L of fluid removed per patient.  He has oxygen desaturation to 89% on room air, which improved to 94% on 4 L oxygen, but denies chest pain, cough, shortness of breath.  Patient has nausea and vomited twice earlier, denies abdominal pain or diarrhea.  No symptoms of UTI.  Denies rectal bleeding or dark stool.  Of note, patient has history of aortitis, and is supposed to take antibiotics indefinitely, but states that has not taken his Augmentin for several months.  Data reviewed independently and ED Course: pt was found to have trop 2621, lactic acid 2.2, WBC 8.7, negative PCR for COVID, flu and RSV, potassium 4.4, bicarbonate 26, creatinine 5.11, BUN 35, INR 1.3, PTT 33, temperature normal, blood pressure 97/59 which improved to 104/66 after giving 500 cc normal saline in ED, RR 90, RR 8.  CT head negative.  Patient is placed on telemetry bed for ablation.  Dr. Mariah Milling of cardiology and Dr. Thedore Mins of nephrology are consulted.  CXR 1. Cardiomegaly with mild pulmonary edema. Underlying pericardial effusion not excluded 2.  Aortic Atherosclerosis (ICD10-I70.0).   EKG: I have personally reviewed.  Sinus rhythm, QTc 508, left bundle blockade, poor R wave progression, T wave inversion in the lateral leads and V5-V6.   Review of  Systems:   General: no fevers, chills, no body weight gain, has fatigue HEENT: no blurry vision, hearing changes or sore throat Respiratory: no dyspnea, coughing, wheezing CV: no chest pain, no palpitations GI: had nausea, vomiting, no abdominal pain, diarrhea, constipation GU: no dysuria, burning on urination, increased urinary frequency, hematuria  Ext: has trace leg edema Neuro: no unilateral weakness, numbness, or tingling, no vision change or hearing loss Skin: no rash, no skin tear. MSK: No muscle spasm, no deformity, no limitation of range of movement in spin Heme: No easy bruising.  Travel history: No recent long distant travel.   Allergy: No Known Allergies  Past Medical History:  Diagnosis Date   Cancer (HCC) 2019   colon   Chronic kidney disease    Diabetes mellitus without complication (HCC)    diet controlled   Hemodialysis access site with arteriovenous graft (HCC)    Hypertension    Infection due to Clostridium septicum (HCC) 08/27/2018   Renal insufficiency    Pt on dialysis x 3 years and receives every Monday, Wednesday and Friday.     Past Surgical History:  Procedure Laterality Date   AV FISTULA PLACEMENT Left 2015   arm   CATARACT EXTRACTION W/ INTRAOCULAR LENS  IMPLANT, BILATERAL Bilateral 08/2017   CENTRAL LINE INSERTION-TUNNELED N/A 04/19/2018   Procedure: CENTRAL LINE INSERTION-TUNNELED;  Surgeon: Renford Dills, MD;  Location: [facility];  Service: Cardiovascular;  Laterality: N/A;   CENTRAL VENOUS CATHETER INSERTION  07/15/2018   Procedure: Insertion Hickman Catheter;  Surgeon:  Annice Needy, MD;  Location: [facility];  Service: Cardiovascular;;   COLON RESECTION N/A 05/16/2018   Procedure: LAPAROSCOPIC  COLON RESECTION;  Surgeon: Carolan Shiver, MD;  Location: [facility];  Service: General;  Laterality: N/A;   COLONOSCOPY N/A 04/18/2018   Procedure: COLONOSCOPY;  Surgeon: Pasty Spillers, MD;  Location: [facility]  ENDOSCOPY;  Service: Endoscopy;  Laterality: N/A;   COLONOSCOPY WITH PROPOFOL N/A 05/22/2019   Procedure: COLONOSCOPY WITH PROPOFOL;  Surgeon: Pasty Spillers, MD;  Location: [facility] ENDOS[facility];  Service: Endoscopy;  Laterality: N/A;   DIALYSIS/PERMA CATHETER REMOVAL N/A 06/19/2018   Procedure: DIALYSIS/PERMA CATHETER REMOVAL;  Surgeon: Annice Needy, MD;  Location: [facility];  Service: Cardiovascular;  Laterality: N/A;   DIALYSIS/PERMA CATHETER REMOVAL N/A 09/24/2018   Procedure: DIALYSIS/PERMA CATHETER REMOVAL;  Surgeon: Renford Dills, MD;  Location: [facility];  Service: Cardiovascular;  Laterality: N/A;   ESOPHAGOGASTRODUODENOSCOPY N/A 04/18/2018   Procedure: ESOPHAGOGASTRODUODENOSCOPY (EGD);  Surgeon: Pasty Spillers, MD;  Location[facility] ENDOSCOPY;  Service: Endoscopy;  Laterality: N/A;   EYE SURGERY     PELVIC ANGIOGRAPHY  07/15/2018   Procedure: PELVIC ANGIOGRAPHY;  Surgeon: Renford Dills, MD;  Location: [facility];  Service: Cardiovascular;;   PERIPHERAL VASCULAR BALLOON ANGIOPLASTY Bilateral 07/15/2018   Procedure: PERIPHERAL VASCULAR BALLOON ANGIOPLASTY;  Surgeon: Annice Needy, MD;  Location: [facility];  Service: Cardiovascular;  Laterality: Bilateral;   PERIPHERAL VASCULAR CATHETERIZATION Left 01/12/2016   Procedure: A/V Shuntogram/Fistulagram;  Surgeon: Annice Needy, MD;  Location: [facility];  Service: Cardiovascular;  Laterality: Left;   PERIPHERAL VASCULAR CATHETERIZATION N/A 01/12/2016   Procedure: A/V Shunt Intervention;  Surgeon: Annice Needy, MD;  Location: [facility];  Service: Cardiovascular;  Laterality: N/A;   PERITONEAL CATHETER INSERTION N/A    REMOVAL OF A DIALYSIS CATHETER N/A 01/14/2015   Procedure: REMOVAL OF A  PERITONEAL DIALYSIS CATHETER;  Surgeon: Annice Needy, MD;  Location: [facility];  Service: Vascular;  Laterality: N/A;   TEE WITHOUT CARDIOVERSION N/A 04/19/2018   Procedure: TRANSESOPHAGEAL  ECHOCARDIOGRAM (TEE);  Surgeon: Dalia Heading, MD;  Location: [facility];  Service: Cardiovascular;  Laterality: N/A;    Social History:  reports that he has never smoked. He has never used smokeless tobacco. He reports that he does not drink alcohol and does not use drugs.  Family History:  Family History  Problem Relation Age of Onset   Diabetes Mother    Heart disease Father      Prior to Admission medications   Medication Sig Start Date End Date Taking? Authorizing Provider  acetaminophen (TYLENOL) 325 MG tablet Take 2 tablets (650 mg total) by mouth every 4 (four) hours as needed for mild pain (or temp > 37.5 C (99.5 F)). 07/15/22   Marguerita Merles Latif, DO  amoxicillin-clavulanate (AUGMENTIN) 500-125 MG tablet Take 1 tablet by mouth daily. 12/19/22   Lynn Ito, MD  aspirin EC 81 MG tablet Take 1 tablet (81 mg total) by mouth daily. Swallow whole. 07/16/22   Marguerita Merles Latif, DO  atorvastatin (LIPITOR) 40 MG tablet Take 1 tablet (40 mg total) by mouth daily. 07/16/22   Sheikh, Kateri Mc Latif, DO  B Complex-C-Folic Acid (RENA-VITE RX) 1 MG TABS Take 1 tablet by mouth daily. 11/02/21   [provider]  cinacalcet (SENSIPAR) 60 MG tablet Take 60 mg by mouth daily. 03/21/22   [provider]  Ergocalciferol (VITAMIN D2) 10  MCG (400 UNIT) TABS Take 1 tab daily 01/16/22   Enedina Finner, MD  famotidine (PEPCID) 20 MG tablet Take 20 mg by mouth 2 (two) times daily. 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    [provider]  ferric citrate (AURYXIA) 1 GM 210 MG(Fe) tablet Take 210 mg by mouth 3 (three) times daily with meals.    [provider]  multivitamin (RENA-VIT) TABS tablet Take 1 tablet by mouth daily. 01/16/22   Enedina Finner, MD  ondansetron (ZOFRAN) 4 MG tablet Take 4 mg by mouth 3 (three) times daily as needed for nausea/vomiting. 06/07/18   [provider]  polyethylene glycol powder (MIRALAX) 17 GM/SCOOP powder Mix full container in 64 ounces of  Gatorade or other clear liquid. No Red Liquids 12/21/22   Celso Amy, PA-C  senna-docusate (SENOKOT-S) 8.6-50 MG tablet Take 1 tablet by mouth 2 (two) times daily. 01/16/22   Enedina Finner, MD    Physical Exam: Vitals:   04/09/23 2129 04/09/23 2129 04/09/23 2130 03/19/2023 0000  BP:   101/60 99/68  Pulse: 89  85 85  Resp: (!) 8  (!) 8 20  Temp:  98.6 F (37 C)    TempSrc:  Oral    SpO2: 92%  96% 99%  Weight:      Height:       General: Not in acute distress HEENT:       Eyes: PERRL, EOMI, no jaundice       ENT: No discharge from the ears and nose, no pharynx injection, no tonsillar enlargement.        Neck: No JVD, no bruit, no mass felt. Heme: No neck lymph node enlargement. Cardiac: S1/S2, RRR, No murmurs, No gallops or rubs. Respiratory: No rales, wheezing, rhonchi or rubs. GI: Soft, nondistended, nontender, no rebound pain, no organomegaly, BS present. GU: No hematuria Ext: has trace leg edema bilaterally. 1+DP/PT pulse bilaterally. Musculoskeletal: No joint deformities, No joint redness or warmth, no limitation of ROM in spin. Skin: No rashes.  Neuro: Alert, oriented X3, cranial nerves II-XII grossly intact, moves all extremities normally.   Labs on Admission: I have personally reviewed following labs and imaging studies  CBC: Recent Labs  Lab 04/09/23 2130  WBC 8.7  HGB 10.7*  HCT 33.4*  MCV 98.2  PLT 85*   Basic Metabolic Panel: Recent Labs  Lab 04/09/23 2130  NA 141  K 4.4  CL 100  CO2 26  GLUCOSE 119*  BUN 35*  CREATININE 5.11*  CALCIUM 8.6*   GFR: Estimated Creatinine Clearance: 12.6 mL/min (A) (by C-G formula based on SCr of 5.11 mg/dL (H)). Liver Function Tests: Recent Labs  Lab 04/09/23 2130  AST 36  ALT 33  ALKPHOS 72  BILITOT 1.1  PROT 5.6*  ALBUMIN 3.3*   No results for input(s): "LIPASE", "AMYLASE" in the last 168 hours. No results for input(s): "AMMONIA" in the last 168 hours. Coagulation Profile: Recent Labs  Lab 04/09/23 2130   INR 1.3*   Cardiac Enzymes: No results for input(s): "CKTOTAL", "CKMB", "CKMBINDEX", "TROPONINI" in the last 168 hours. BNP (last 3 results) No results for input(s): "PROBNP" in the last 8760 hours. HbA1C: No results for input(s): "HGBA1C" in the last 72 hours. CBG: No results for input(s): "GLUCAP" in the last 168 hours. Lipid Profile: Recent Labs    04/09/23 2130  CHOL 160  HDL 38*  LDLCALC 105*  TRIG 85  CHOLHDL 4.2   Thyroid Function Tests: No results for input(s): "TSH", "T4TOTAL", "  FREET4", "T3FREE", "THYROIDAB" in the last 72 hours. Anemia Panel: No results for input(s): "VITAMINB12", "FOLATE", "FERRITIN", "TIBC", "IRON", "RETICCTPCT" in the last 72 hours. Urine analysis:    Component Value Date/Time   COLORURINE Yellow 04/11/2014 2115   APPEARANCEUR Clear 04/11/2014 2115   LABSPEC 1.012 04/11/2014 2115   PHURINE 7.0 04/11/2014 2115   GLUCOSEU 150 mg/dL 52/84/1324 4010   HGBUR 2+ 04/11/2014 2115   BILIRUBINUR Negative 04/11/2014 2115   KETONESUR Negative 04/11/2014 2115   PROTEINUR >=500 04/11/2014 2115   NITRITE Negative 04/11/2014 2115   LEUKOCYTESUR Negative 04/11/2014 2115   Sepsis Labs: @LABRCNTIP (procalcitonin:4,lacticidven:4) ) Recent Results (from the past 240 hour(s))  Resp panel by RT-PCR (RSV, Flu A&B, Covid) Anterior Nasal Swab     Status: None   Collection Time: 04/09/23  9:30 PM   Specimen: Anterior Nasal Swab  Result Value Ref Range Status   SARS Coronavirus 2 by RT PCR NEGATIVE NEGATIVE Final    Comment: (NOTE) SARS-CoV-2 target nucleic acids are NOT DETECTED.  The SARS-CoV-2 RNA is generally detectable in upper respiratory specimens during the acute phase of infection. The lowest concentration of SARS-CoV-2 viral copies this assay can detect is 138 copies/mL. A negative result does not preclude SARS-Cov-2 infection and should not be used as the sole basis for treatment or other patient management decisions. A negative result may occur  with  improper specimen collection/handling, submission of specimen other than nasopharyngeal swab, presence of viral mutation(s) within the areas targeted by this assay, and inadequate number of viral copies(<138 copies/mL). A negative result must be combined with clinical observations, patient history, and epidemiological information. The expected result is Negative.  Fact Sheet for Patients:  BloggerCourse.com  Fact Sheet for Healthcare Providers:  SeriousBroker.it  This test is no t yet approved or cleared by the [place] FDA and  has been authorized for detection and/or diagnosis of SARS-CoV-2 by FDA under an Emergency Use Authorization (EUA). This EUA will remain  in effect (meaning this test can be used) for the duration of the COVID-19 declaration under Section 564(b)(1) of the Act, 21 U.S.C.section 360bbb-3(b)(1), unless the authorization is terminated  or revoked sooner.       Influenza A by PCR NEGATIVE NEGATIVE Final   Influenza B by PCR NEGATIVE NEGATIVE Final    Comment: (NOTE) The Xpert Xpress SARS-CoV-2/FLU/RSV plus assay is intended as an aid in the diagnosis of influenza from Nasopharyngeal swab specimens and should not be used as a sole basis for treatment. Nasal washings and aspirates are unacceptable for Xpert Xpress SARS-CoV-2/FLU/RSV testing.  Fact Sheet for Patients: BloggerCourse.com  Fact Sheet for Healthcare Providers: SeriousBroker.it  This test is not yet approved or cleared by the [place] FDA and has been authorized for detection and/or diagnosis of SARS-CoV-2 by FDA under an Emergency Use Authorization (EUA). This EUA will remain in effect (meaning this test can be used) for the duration of the COVID-19 declaration under Section 564(b)(1) of the Act, 21 U.S.C. section 360bbb-3(b)(1), unless the authorization is terminated  or revoked.     Resp Syncytial Virus by PCR NEGATIVE NEGATIVE Final    Comment: (NOTE) Fact Sheet for Patients: BloggerCourse.com  Fact Sheet for Healthcare Providers: SeriousBroker.it  This test is not yet approved or cleared by the [place] FDA and has been authorized for detection and/or diagnosis of SARS-CoV-2 by FDA under an Emergency Use Authorization (EUA). This EUA will remain in effect (meaning this test can be used) for the duration of the  COVID-19 declaration under Section 564(b)(1) of the Act, 21 U.S.C. section 360bbb-3(b)(1), unless the authorization is terminated or revoked.  Performed at [facility], [address] [place], [place] [address]      Radiological Exams on Admission: CT Head Wo Contrast  Result Date: 04/09/2023 CLINICAL DATA:  Headache, new onset (Age >= 51y) EXAM: CT HEAD WITHOUT CONTRAST TECHNIQUE: Contiguous axial images were obtained from the base of the skull through the vertex without intravenous contrast. RADIATION DOSE REDUCTION: This exam was performed according to the departmental dose-optimization program which includes automated exposure control, adjustment of the mA and/or kV according to patient size and/or use of iterative reconstruction technique. COMPARISON:  CT head 07/12/2022, MRI head 07/09/2022 FINDINGS: Brain: Patchy and confluent areas of decreased attenuation are noted throughout the deep and periventricular white matter of the cerebral hemispheres bilaterally, compatible with chronic microvascular ischemic disease. Chronic cerebellar lacunar infarctions again noted. No evidence of large-territorial acute infarction. No parenchymal hemorrhage. No mass lesion. No extra-axial collection. No mass effect or midline shift. No hydrocephalus. Basilar cisterns are patent. Vascular: No hyperdense vessel. Atherosclerotic calcifications are present within the cavernous internal  carotid arteries. Skull: No acute fracture or focal lesion. Sinuses/Orbits: Partial right mastoid air cell effusion. Paranasal sinuses and left mastoid air cells are clear. Bilateral lens replacement. Otherwise the orbits are unremarkable. Other: None. IMPRESSION: No acute intracranial abnormality. Electronically Signed   By: Tish Frederickson M.D.   On: 04/09/2023 23:39   DG Chest Port 1 View  Result Date: 04/09/2023 CLINICAL DATA:  Questionable sepsis - evaluate for abnormality EXAM: PORTABLE CHEST 1 VIEW.  Patient is rotated. COMPARISON:  Chest x-ray 07/12/2022, CT chest 07/08/2018, chest x-ray 02/15/2021 FINDINGS: Persistent enlarged cardiac silhouette. The heart and mediastinal contours are unchanged. Aortic calcification. Patient is rotated with right hilar density likely due to vasculature positioning. No focal consolidation. Mild pulmonary edema. No pleural effusion. No pneumothorax. No acute osseous abnormality. IMPRESSION: 1. Cardiomegaly with mild pulmonary edema. Underlying pericardial effusion not excluded 2.  Aortic Atherosclerosis (ICD10-I70.0). Electronically Signed   By: Tish Frederickson M.D.   On: 04/09/2023 23:36      Assessment/Plan Principal Problem:   NSTEMI (non-ST elevated myocardial infarction) [facility]) Active Problems:   ESRD on hemodialysis (HCC)   Chronic diastolic CHF (congestive heart failure) (HCC)   Essential hypertension   Elevated lactic acid level   Anemia in ESRD (end-stage renal disease) (HCC)   Thrombocytopenia (HCC)   Aortitis (HCC)   Stroke (HCC)   Assessment and Plan:  NSTEMI (non-ST elevated myocardial infarction) (HCC): trop 2621. Pt denies chest pain.  IV heparin is ordered by EDP, but due to history of right eye vitreous hemorrhage and recent bleeding 3 months ago), I discontinued IV heparin.  Consulted Dr. Mariah Milling of cardiology.  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  Has new oxygen requirement currently on 4 L. -consulted Dr. Thedore Mins of renal for HD  Chronic diastolic CHF (congestive heart failure) (HCC): 2D echo on 07/13/2022 showed EF of 55 to 60% with grade 2 diastolic dysfunction.  Patient has fluid overload now. -Volume management per renal by dialysis  Essential hypertension: blood pressure soft.  Patient is not taking medications currently -Monitor blood pressure closely  Elevated lactic acid level:  Lactic acid 2.2.  No signs of infection.  No leukocytosis.  Likely due to hypoxia. -Will not give IV fluid due to fluid overload  Anemia in ESRD (end-stage renal disease) (HCC): Hemoglobin 10.7 (12.1 on 03/28/2023).  No active bleeding. -Follow-up with CBC  Thrombocytopenia (HCC): This is chronic issue.  Platelet 85.  No active bleeding -Follow up CBC  Aortitis (HCC): Patient is supposed to take Augmentin definitely, but stopped taking this medication few months ago -Resumed Augmentin  Stroke (HCC) -Aspirin and Lipitor       DVT ppx: SCD  Code Status: Full code    Family Communication:  Yes, patient's daughter  at bed side.   Disposition Plan:  Anticipate discharge back to previous environment  Consults called:  Dr. Mariah Milling of card  Admission status and Level of care: Telemetry Cardiac:     for obs    Dispo: The patient is from: Home              Anticipated d/c is to: Home              Anticipated d/c date is: 1 day              Patient currently is not medically stable to d/c.    Severity of Illness:  The appropriate patient status for this patient is OBSERVATION. Observation status is judged to be reasonable and necessary in order to provide the required intensity of service to ensure the patient's safety. The patient's presenting symptoms, physical exam findings, and initial radiographic and  laboratory data in the context of their medical condition is felt to place them at decreased risk for further clinical deterioration. Furthermore, it is anticipated that the patient will be medically stable for discharge from the hospital within 2 midnights of admission.        Date of Service 04/06/2023    Lorretta Harp [facility]   If 7PM-7AM, please contact night-coverage [email or web address] 04/08/2023, 1:08 AM

## 2023-04-09 NOTE — ED Provider Notes (Signed)
[facility] Provider Note    Event Date/Time   First MD Initiated Contact with Patient 04/09/23 2114     (approximate)   History   Hypotension   HPI  Jonathan Mcgrath is a 82 y.o. male past medical tree significant for hypertension, ESRD on HD MWF, diabetes, history of colon cancer, CLL, history of aortitis, thrombocytopenia, presents to the emergency department for not feeling well.  Patient states that he has not been feeling well for the past couple of days.  Went to dialysis today and was told that his blood pressure was low, they were unable to pull off is much as they normally would.  States that he has not been feeling well with an episode of nausea and vomiting.  Mild cough.  No significant shortness of breath or chest pain.  Denies any abdominal pain.  Does not make urine.  No rashes or wounds.  No diarrhea.  Denies any blood in his stool.  Denies being on anticoagulation.  Denies any falls or trauma.  Does complain of a slight headache.  Went to dialysis today and did not get a complete session of dialysis secondary to low blood pressure.       Physical Exam   Triage Vital Signs: ED Triage Vitals [04/09/23 2111]  Encounter Vitals Group     BP      Systolic BP Percentile      Diastolic BP Percentile      Pulse      Resp      Temp      Temp src      SpO2      Weight 200 lb (90.7 kg)     Height 6\' 1"  (1.854 m)     Head Circumference      Peak Flow      Pain Score 0     Pain Loc      Pain Education      Exclude from Growth Chart     Most recent vital signs: Vitals:   04/09/23 2129 04/09/23 2130  BP:  101/60  Pulse:  85  Resp:  (!) 8  Temp: 98.6 F (37 C)   SpO2:  96%    Physical Exam Constitutional:      Appearance: He is well-developed.  HENT:     Head: Atraumatic.     Mouth/Throat:     Mouth: Mucous membranes are dry.  Eyes:     Extraocular Movements: Extraocular movements intact.     Conjunctiva/sclera: Conjunctivae  normal.     Pupils: Pupils are equal, round, and reactive to light.  Cardiovascular:     Rate and Rhythm: Regular rhythm.     Heart sounds: Murmur heard.  Pulmonary:     Comments: Hypoxic to 88%, placed on 2 L nasal cannula Abdominal:     General: There is no distension.     Tenderness: There is no abdominal tenderness.  Musculoskeletal:     Cervical back: Normal range of motion.     Right lower leg: No edema.     Left lower leg: No edema.  Skin:    General: Skin is warm.     Capillary Refill: Capillary refill takes less than 2 seconds.  Neurological:     Mental Status: He is alert. Mental status is at baseline.     IMPRESSION / MDM / ASSESSMENT AND PLAN / ED COURSE  I reviewed the triage vital signs and the nursing notes.  Differential  diagnosis including dehydration secondary to decreased p.o. intake, infectious process, pneumonia, ACS, anemia, electrolyte abnormality  EKG  I, Corena Herter, the attending physician, personally viewed and interpreted this ECG.  EKG with left bundle branch block, negative Sgarbossa's criteria.  This is change compared to prior EKG.  Prior EKG with signs of LVH but no underlying bundle branch block.  No tachycardic or bradycardic dysrhythmias while on cardiac telemetry.  RADIOLOGY I independently reviewed imaging, my interpretation of imaging: Chest x-ray with cardiomegaly and findings concerning for pulmonary edema  LABS (all labs ordered are listed, but only abnormal results are displayed) Labs interpreted as -    Labs Reviewed  CBC - Abnormal; Notable for the following components:      Result Value   RBC 3.40 (*)    Hemoglobin 10.7 (*)    HCT 33.4 (*)    Platelets 85 (*)    nRBC 0.3 (*)    All other components within normal limits  COMPREHENSIVE METABOLIC PANEL - Abnormal; Notable for the following components:   Glucose, Bld 119 (*)    BUN 35 (*)    Creatinine, Ser 5.11 (*)    Calcium 8.6 (*)    Total Protein 5.6 (*)     Albumin 3.3 (*)    GFR, Estimated 11 (*)    All other components within normal limits  LACTIC ACID, PLASMA - Abnormal; Notable for the following components:   Lactic Acid, Venous 2.2 (*)    All other components within normal limits  PROTIME-INR - Abnormal; Notable for the following components:   Prothrombin Time 16.4 (*)    INR 1.3 (*)    All other components within normal limits  BRAIN NATRIURETIC PEPTIDE - Abnormal; Notable for the following components:   B Natriuretic Peptide 3,735.5 (*)    All other components within normal limits  TROPONIN I (HIGH SENSITIVITY) - Abnormal; Notable for the following components:   Troponin I (High Sensitivity) 2,621 (*)    All other components within normal limits  RESP PANEL BY RT-PCR (RSV, FLU A&B, COVID)  RVPGX2  CULTURE, BLOOD (SINGLE)  APTT  LACTIC ACID, PLASMA  HEMOGLOBIN A1C  LIPID PANEL     MDM  Felt that 30 cc/kg of IV fluids may be detrimental to the patient given his history of ESRD, given a 500 bolus of IV fluids and will reevaluate.  Patient received a 500 bolus with EMS prior to arrival.  Patient hypoxic, requiring 4 L nasal cannula.  Troponin significantly elevated almost to 3000 and significantly elevated BNP.  No active chest pain or pain other than a headache at this time.  Stat CT head for further evaluation.  If CT scan of the head without findings of acute intracranial hemorrhage then will start on heparin bolus and infusion.  Patient does not make any urine.  Low suspicion for dissection, no tearing chest pain, pulses are equal.  Lower suspicion for pulmonary embolism.  Consulted hospitalist for admission for NSTEMI.     PROCEDURES:  Critical Care performed: yes  .Critical Care  Performed by: Corena Herter, MD Authorized by: Corena Herter, MD   Critical care provider statement:    Critical care time (minutes):  30   Critical care time was exclusive of:  Separately billable procedures and treating other  patients   Critical care was necessary to treat or prevent imminent or life-threatening deterioration of the following conditions:  Circulatory failure and sepsis   Critical care was time spent personally by me  on the following activities:  Development of treatment plan with patient or surrogate, discussions with consultants, evaluation of patient's response to treatment, examination of patient, ordering and review of laboratory studies, ordering and review of radiographic studies, ordering and performing treatments and interventions, pulse oximetry, re-evaluation of patient's condition and review of old charts   I assumed direction of critical care for this patient from another provider in my specialty: no     Care discussed with: admitting provider     Patient's presentation is most consistent with acute presentation with potential threat to life or bodily function.   MEDICATIONS ORDERED IN ED: Medications  aspirin chewable tablet 324 mg (has no administration in time range)  acetaminophen (TYLENOL) tablet 1,000 mg (has no administration in time range)  acetaminophen (TYLENOL) tablet 650 mg (has no administration in time range)  diphenhydrAMINE (BENADRYL) injection 12.5 mg (has no administration in time range)  sodium chloride 0.9 % bolus 500 mL (0 mLs Intravenous Stopped 04/09/23 2209)    FINAL CLINICAL IMPRESSION(S) / ED DIAGNOSES   Final diagnoses:  NSTEMI (non-ST elevated myocardial infarction) (HCC)     Rx / DC Orders   ED Discharge Orders     None        Note:  This document was prepared using Dragon voice recognition software and may include unintentional dictation errors.   Corena Herter, MD 04/09/23 2312

## 2023-04-09 NOTE — ED Notes (Signed)
RN changed pt o2 to 4l/min via [facility].

## 2023-04-09 NOTE — ED Triage Notes (Signed)
Pt arrived via EMS from home due to low BP. Pt sts that he had dialysis today and pt did not drink is allowed 32oz of a daily limit. Per Ems pt was orthostatic positive. Pt has given 50ml of fluid via 22g in the right hand.

## 2023-04-09 NOTE — ED Notes (Signed)
Lab - Lactic 2.2  Sherelle L   MD notified

## 2023-04-09 NOTE — ED Notes (Signed)
Attending in room with pt notified of need to change admission order from Medical Telemetry to Cardiac only admit order due to diagnosis of NSTEMI. While in room, attending reports that Heparin will be held due to pt having hx and recent bleeding to eye.  (Attending Clyde Lundborg, MD)

## 2023-04-09 NOTE — Progress Notes (Signed)
ANTICOAGULATION CONSULT NOTE - Initial Consult  Pharmacy Consult for Heparin  Indication: chest pain/ACS  No Known Allergies  Patient Measurements: Height: 6\' 1"  (185.4 cm) Weight: 90.7 kg (200 lb) IBW/kg (Calculated) : 79.9 Heparin Dosing Weight: 90.7 kg   Vital Signs: Temp: 98.6 F (37 C) (09/23 2129) Temp Source: Oral (09/23 2129) BP: 101/60 (09/23 2130) Pulse Rate: 85 (09/23 2130)  Labs: Recent Labs    04/09/23 2130  HGB 10.7*  HCT 33.4*  PLT 85*  APTT 33  LABPROT 16.4*  INR 1.3*  CREATININE 5.11*  TROPONINIHS 2,621*    Estimated Creatinine Clearance: 12.6 mL/min (A) (by C-G formula based on SCr of 5.11 mg/dL (H)).   Medical History: Past Medical History:  Diagnosis Date   Cancer (HCC) 2019   colon   Chronic kidney disease    Diabetes mellitus without complication (HCC)    diet controlled   Hemodialysis access site with arteriovenous graft (HCC)    Hypertension    Infection due to Clostridium septicum (HCC) 08/27/2018   Renal insufficiency    Pt on dialysis x 3 years and receives every Monday, Wednesday and Friday.     Medications:  (Not in a hospital admission)   Assessment: Pharmacy consulted to dose heparin in this 82 year old male admitted with ACS/NSTEMI.  No prior anticoag noted. CrCl = 12.6 ml/hr  Goal of Therapy:  Heparin level 0.3-0.7 units/ml Monitor platelets by anticoagulation protocol: Yes   Plan:  Give 4000 units bolus x 1 Start heparin infusion at 1250 units/hr Check anti-Xa level in 8 hours and daily while on heparin Continue to monitor H&H and platelets  Kijana Cromie D 04/09/2023,11:20 PM

## 2023-04-10 ENCOUNTER — Encounter: Payer: Self-pay | Admitting: Internal Medicine

## 2023-04-10 ENCOUNTER — Encounter (HOSPITAL_COMMUNITY): Payer: Self-pay

## 2023-04-10 ENCOUNTER — Inpatient Hospital Stay (HOSPITAL_COMMUNITY)
Admission: AD | Admit: 2023-04-10 | Discharge: 2023-05-18 | DRG: 215 | Disposition: E | Payer: Medicare HMO | Source: Other Acute Inpatient Hospital | Attending: Internal Medicine | Admitting: Internal Medicine

## 2023-04-10 ENCOUNTER — Encounter (HOSPITAL_COMMUNITY): Payer: Self-pay | Admitting: Cardiovascular Disease

## 2023-04-10 ENCOUNTER — Encounter
Admission: EM | Disposition: A | Discharge status: Other Acute Inpatient Hospital | Payer: Self-pay | Source: Home / Self Care | Attending: Internal Medicine

## 2023-04-10 ENCOUNTER — Other Ambulatory Visit: Payer: Self-pay

## 2023-04-10 ENCOUNTER — Observation Stay (HOSPITAL_COMMUNITY)
Admit: 2023-04-10 | Discharge: 2023-04-10 | Disposition: A | Discharge status: Home / Self Care | Payer: Medicare HMO | Attending: Cardiology | Admitting: Cardiology

## 2023-04-10 DIAGNOSIS — Z8673 Personal history of transient ischemic attack (TIA), and cerebral infarction without residual deficits: Secondary | ICD-10-CM | POA: Diagnosis not present

## 2023-04-10 DIAGNOSIS — I5020 Unspecified systolic (congestive) heart failure: Secondary | ICD-10-CM

## 2023-04-10 DIAGNOSIS — I350 Nonrheumatic aortic (valve) stenosis: Secondary | ICD-10-CM

## 2023-04-10 DIAGNOSIS — Z66 Do not resuscitate: Secondary | ICD-10-CM | POA: Diagnosis not present

## 2023-04-10 DIAGNOSIS — I132 Hypertensive heart and chronic kidney disease with heart failure and with stage 5 chronic kidney disease, or end stage renal disease: Secondary | ICD-10-CM | POA: Diagnosis present

## 2023-04-10 DIAGNOSIS — E1151 Type 2 diabetes mellitus with diabetic peripheral angiopathy without gangrene: Secondary | ICD-10-CM | POA: Diagnosis present

## 2023-04-10 DIAGNOSIS — E1165 Type 2 diabetes mellitus with hyperglycemia: Secondary | ICD-10-CM | POA: Diagnosis not present

## 2023-04-10 DIAGNOSIS — Z8249 Family history of ischemic heart disease and other diseases of the circulatory system: Secondary | ICD-10-CM

## 2023-04-10 DIAGNOSIS — I953 Hypotension of hemodialysis: Secondary | ICD-10-CM | POA: Diagnosis not present

## 2023-04-10 DIAGNOSIS — Z91199 Patient's noncompliance with other medical treatment and regimen due to unspecified reason: Secondary | ICD-10-CM

## 2023-04-10 DIAGNOSIS — Z79899 Other long term (current) drug therapy: Secondary | ICD-10-CM | POA: Diagnosis not present

## 2023-04-10 DIAGNOSIS — I083 Combined rheumatic disorders of mitral, aortic and tricuspid valves: Secondary | ICD-10-CM | POA: Diagnosis present

## 2023-04-10 DIAGNOSIS — D631 Anemia in chronic kidney disease: Secondary | ICD-10-CM | POA: Diagnosis present

## 2023-04-10 DIAGNOSIS — Z951 Presence of aortocoronary bypass graft: Secondary | ICD-10-CM

## 2023-04-10 DIAGNOSIS — I2510 Atherosclerotic heart disease of native coronary artery without angina pectoris: Secondary | ICD-10-CM

## 2023-04-10 DIAGNOSIS — Z7902 Long term (current) use of antithrombotics/antiplatelets: Secondary | ICD-10-CM

## 2023-04-10 DIAGNOSIS — I3139 Other pericardial effusion (noninflammatory): Secondary | ICD-10-CM | POA: Diagnosis present

## 2023-04-10 DIAGNOSIS — Y838 Other surgical procedures as the cause of abnormal reaction of the patient, or of later complication, without mention of misadventure at the time of the procedure: Secondary | ICD-10-CM | POA: Diagnosis present

## 2023-04-10 DIAGNOSIS — C9110 Chronic lymphocytic leukemia of B-cell type not having achieved remission: Secondary | ICD-10-CM | POA: Diagnosis present

## 2023-04-10 DIAGNOSIS — I429 Cardiomyopathy, unspecified: Secondary | ICD-10-CM | POA: Diagnosis not present

## 2023-04-10 DIAGNOSIS — R570 Cardiogenic shock: Secondary | ICD-10-CM | POA: Diagnosis not present

## 2023-04-10 DIAGNOSIS — R0602 Shortness of breath: Secondary | ICD-10-CM | POA: Diagnosis present

## 2023-04-10 DIAGNOSIS — E875 Hyperkalemia: Secondary | ICD-10-CM | POA: Diagnosis present

## 2023-04-10 DIAGNOSIS — K5900 Constipation, unspecified: Secondary | ICD-10-CM | POA: Diagnosis not present

## 2023-04-10 DIAGNOSIS — I214 Non-ST elevation (NSTEMI) myocardial infarction: Secondary | ICD-10-CM

## 2023-04-10 DIAGNOSIS — R110 Nausea: Secondary | ICD-10-CM | POA: Diagnosis not present

## 2023-04-10 DIAGNOSIS — J9601 Acute respiratory failure with hypoxia: Secondary | ICD-10-CM | POA: Diagnosis present

## 2023-04-10 DIAGNOSIS — I447 Left bundle-branch block, unspecified: Secondary | ICD-10-CM | POA: Diagnosis present

## 2023-04-10 DIAGNOSIS — I060 Rheumatic aortic stenosis: Secondary | ICD-10-CM | POA: Diagnosis present

## 2023-04-10 DIAGNOSIS — T82856A Stenosis of peripheral vascular stent, initial encounter: Secondary | ICD-10-CM | POA: Diagnosis present

## 2023-04-10 DIAGNOSIS — Y848 Other medical procedures as the cause of abnormal reaction of the patient, or of later complication, without mention of misadventure at the time of the procedure: Secondary | ICD-10-CM | POA: Diagnosis not present

## 2023-04-10 DIAGNOSIS — Z961 Presence of intraocular lens: Secondary | ICD-10-CM | POA: Diagnosis present

## 2023-04-10 DIAGNOSIS — I462 Cardiac arrest due to underlying cardiac condition: Secondary | ICD-10-CM | POA: Diagnosis not present

## 2023-04-10 DIAGNOSIS — D696 Thrombocytopenia, unspecified: Secondary | ICD-10-CM | POA: Diagnosis present

## 2023-04-10 DIAGNOSIS — Z85038 Personal history of other malignant neoplasm of large intestine: Secondary | ICD-10-CM

## 2023-04-10 DIAGNOSIS — R531 Weakness: Secondary | ICD-10-CM | POA: Diagnosis not present

## 2023-04-10 DIAGNOSIS — D649 Anemia, unspecified: Secondary | ICD-10-CM | POA: Diagnosis not present

## 2023-04-10 DIAGNOSIS — N186 End stage renal disease: Secondary | ICD-10-CM | POA: Diagnosis present

## 2023-04-10 DIAGNOSIS — Z9841 Cataract extraction status, right eye: Secondary | ICD-10-CM

## 2023-04-10 DIAGNOSIS — Z7982 Long term (current) use of aspirin: Secondary | ICD-10-CM

## 2023-04-10 DIAGNOSIS — Z9049 Acquired absence of other specified parts of digestive tract: Secondary | ICD-10-CM

## 2023-04-10 DIAGNOSIS — I776 Arteritis, unspecified: Secondary | ICD-10-CM | POA: Diagnosis present

## 2023-04-10 DIAGNOSIS — I700 Atherosclerosis of aorta: Secondary | ICD-10-CM | POA: Diagnosis present

## 2023-04-10 DIAGNOSIS — Z992 Dependence on renal dialysis: Secondary | ICD-10-CM | POA: Diagnosis not present

## 2023-04-10 DIAGNOSIS — E871 Hypo-osmolality and hyponatremia: Secondary | ICD-10-CM | POA: Diagnosis not present

## 2023-04-10 DIAGNOSIS — E782 Mixed hyperlipidemia: Secondary | ICD-10-CM | POA: Diagnosis present

## 2023-04-10 DIAGNOSIS — M898X9 Other specified disorders of bone, unspecified site: Secondary | ICD-10-CM | POA: Diagnosis present

## 2023-04-10 DIAGNOSIS — J810 Acute pulmonary edema: Secondary | ICD-10-CM | POA: Diagnosis not present

## 2023-04-10 DIAGNOSIS — R34 Anuria and oliguria: Secondary | ICD-10-CM | POA: Diagnosis present

## 2023-04-10 DIAGNOSIS — I5082 Biventricular heart failure: Secondary | ICD-10-CM | POA: Diagnosis present

## 2023-04-10 DIAGNOSIS — Z515 Encounter for palliative care: Secondary | ICD-10-CM | POA: Diagnosis not present

## 2023-04-10 DIAGNOSIS — H4311 Vitreous hemorrhage, right eye: Secondary | ICD-10-CM | POA: Diagnosis present

## 2023-04-10 DIAGNOSIS — Z1152 Encounter for screening for COVID-19: Secondary | ICD-10-CM | POA: Diagnosis not present

## 2023-04-10 DIAGNOSIS — I4901 Ventricular fibrillation: Secondary | ICD-10-CM | POA: Diagnosis not present

## 2023-04-10 DIAGNOSIS — I361 Nonrheumatic tricuspid (valve) insufficiency: Secondary | ICD-10-CM | POA: Diagnosis not present

## 2023-04-10 DIAGNOSIS — E440 Moderate protein-calorie malnutrition: Secondary | ICD-10-CM | POA: Insufficient documentation

## 2023-04-10 DIAGNOSIS — E1122 Type 2 diabetes mellitus with diabetic chronic kidney disease: Secondary | ICD-10-CM | POA: Diagnosis present

## 2023-04-10 DIAGNOSIS — I5043 Acute on chronic combined systolic (congestive) and diastolic (congestive) heart failure: Secondary | ICD-10-CM | POA: Diagnosis present

## 2023-04-10 DIAGNOSIS — Z9842 Cataract extraction status, left eye: Secondary | ICD-10-CM

## 2023-04-10 DIAGNOSIS — E872 Acidosis, unspecified: Secondary | ICD-10-CM | POA: Diagnosis present

## 2023-04-10 DIAGNOSIS — I272 Pulmonary hypertension, unspecified: Secondary | ICD-10-CM | POA: Diagnosis present

## 2023-04-10 DIAGNOSIS — I255 Ischemic cardiomyopathy: Secondary | ICD-10-CM | POA: Diagnosis present

## 2023-04-10 DIAGNOSIS — Z7189 Other specified counseling: Secondary | ICD-10-CM | POA: Diagnosis not present

## 2023-04-10 DIAGNOSIS — T82868A Thrombosis of vascular prosthetic devices, implants and grafts, initial encounter: Secondary | ICD-10-CM | POA: Diagnosis not present

## 2023-04-10 DIAGNOSIS — Z833 Family history of diabetes mellitus: Secondary | ICD-10-CM

## 2023-04-10 DIAGNOSIS — E8779 Other fluid overload: Secondary | ICD-10-CM | POA: Diagnosis not present

## 2023-04-10 DIAGNOSIS — I5021 Acute systolic (congestive) heart failure: Secondary | ICD-10-CM | POA: Diagnosis not present

## 2023-04-10 DIAGNOSIS — N2581 Secondary hyperparathyroidism of renal origin: Secondary | ICD-10-CM | POA: Diagnosis present

## 2023-04-10 HISTORY — PX: ABDOMINAL AORTOGRAM: CATH118222

## 2023-04-10 HISTORY — PX: RIGHT HEART CATH AND CORONARY ANGIOGRAPHY: CATH118264

## 2023-04-10 LAB — POCT I-STAT 7, (LYTES, BLD GAS, ICA,H+H)
Acid-Base Excess: 1 mmol/L (ref 0.0–2.0)
Bicarbonate: 25.7 mmol/L (ref 20.0–28.0)
Calcium, Ion: 1.09 mmol/L — ABNORMAL LOW (ref 1.15–1.40)
HCT: 33 % — ABNORMAL LOW (ref 39.0–52.0)
Hemoglobin: 11.2 g/dL — ABNORMAL LOW (ref 13.0–17.0)
O2 Saturation: 94 %
Potassium: 5 mmol/L (ref 3.5–5.1)
Sodium: 137 mmol/L (ref 135–145)
TCO2: 27 mmol/L (ref 22–32)
pCO2 arterial: 39.6 mmHg (ref 32–48)
pH, Arterial: 7.421 (ref 7.35–7.45)
pO2, Arterial: 71 mmHg — ABNORMAL LOW (ref 83–108)

## 2023-04-10 LAB — BASIC METABOLIC PANEL
Anion gap: 11 (ref 5–15)
BUN: 40 mg/dL — ABNORMAL HIGH (ref 8–23)
CO2: 28 mmol/L (ref 22–32)
Calcium: 9 mg/dL (ref 8.9–10.3)
Chloride: 100 mmol/L (ref 98–111)
Creatinine, Ser: 5.69 mg/dL — ABNORMAL HIGH (ref 0.61–1.24)
GFR, Estimated: 9 mL/min — ABNORMAL LOW (ref >60)
Glucose, Bld: 105 mg/dL — ABNORMAL HIGH (ref 70–99)
Potassium: 5.6 mmol/L — ABNORMAL HIGH (ref 3.5–5.1)
Sodium: 139 mmol/L (ref 135–145)

## 2023-04-10 LAB — CBC
HCT: 31.6 % — ABNORMAL LOW (ref 39.0–52.0)
HCT: 32.8 % — ABNORMAL LOW (ref 39.0–52.0)
Hemoglobin: 10.2 g/dL — ABNORMAL LOW (ref 13.0–17.0)
Hemoglobin: 10.7 g/dL — ABNORMAL LOW (ref 13.0–17.0)
MCH: 31.7 pg (ref 26.0–34.0)
MCH: 31.3 pg (ref 26.0–34.0)
MCHC: 32.3 g/dL (ref 30.0–36.0)
MCHC: 32.6 g/dL (ref 30.0–36.0)
MCV: 98.1 fL (ref 80.0–100.0)
MCV: 95.9 fL (ref 80.0–100.0)
Platelets: 81 10*3/uL — ABNORMAL LOW (ref 150–400)
Platelets: 80 10*3/uL — ABNORMAL LOW (ref 150–400)
RBC: 3.22 MIL/uL — ABNORMAL LOW (ref 4.22–5.81)
RBC: 3.42 MIL/uL — ABNORMAL LOW (ref 4.22–5.81)
RDW: 14.6 % (ref 11.5–15.5)
RDW: 14.6 % (ref 11.5–15.5)
WBC: 7.7 10*3/uL (ref 4.0–10.5)
WBC: 8.4 10*3/uL (ref 4.0–10.5)
nRBC: 0.3 % — ABNORMAL HIGH (ref 0.0–0.2)
nRBC: 0.4 % — ABNORMAL HIGH (ref 0.0–0.2)

## 2023-04-10 LAB — POCT I-STAT EG7
Acid-Base Excess: 3 mmol/L — ABNORMAL HIGH (ref 0.0–2.0)
Bicarbonate: 28.4 mmol/L — ABNORMAL HIGH (ref 20.0–28.0)
Calcium, Ion: 1.09 mmol/L — ABNORMAL LOW (ref 1.15–1.40)
HCT: 32 % — ABNORMAL LOW (ref 39.0–52.0)
Hemoglobin: 10.9 g/dL — ABNORMAL LOW (ref 13.0–17.0)
O2 Saturation: 67 %
Potassium: 5 mmol/L (ref 3.5–5.1)
Sodium: 137 mmol/L (ref 135–145)
TCO2: 30 mmol/L (ref 22–32)
pCO2, Ven: 45.6 mmHg (ref 44–60)
pH, Ven: 7.402 (ref 7.25–7.43)
pO2, Ven: 35 mmHg (ref 32–45)

## 2023-04-10 LAB — COMPREHENSIVE METABOLIC PANEL
ALT: 54 U/L — ABNORMAL HIGH (ref 0–44)
AST: 75 U/L — ABNORMAL HIGH (ref 15–41)
Albumin: 3.2 g/dL — ABNORMAL LOW (ref 3.5–5.0)
Alkaline Phosphatase: 74 U/L (ref 38–126)
Anion gap: 13 (ref 5–15)
BUN: 47 mg/dL — ABNORMAL HIGH (ref 8–23)
CO2: 26 mmol/L (ref 22–32)
Calcium: 8.7 mg/dL — ABNORMAL LOW (ref 8.9–10.3)
Chloride: 100 mmol/L (ref 98–111)
Creatinine, Ser: 6.87 mg/dL — ABNORMAL HIGH (ref 0.61–1.24)
GFR, Estimated: 7 mL/min — ABNORMAL LOW (ref >60)
Glucose, Bld: 84 mg/dL (ref 70–99)
Potassium: 5.3 mmol/L — ABNORMAL HIGH (ref 3.5–5.1)
Sodium: 139 mmol/L (ref 135–145)
Total Bilirubin: 1.1 mg/dL (ref 0.3–1.2)
Total Protein: 5.8 g/dL — ABNORMAL LOW (ref 6.5–8.1)

## 2023-04-10 LAB — PROTIME-INR
INR: 1.3 — ABNORMAL HIGH (ref 0.8–1.2)
Prothrombin Time: 16.1 seconds — ABNORMAL HIGH (ref 11.4–15.2)

## 2023-04-10 LAB — ECHOCARDIOGRAM COMPLETE
AR max vel: 0.54 cm2
AV Area VTI: 0.55 cm2
AV Area mean vel: 0.55 cm2
AV Mean grad: 25 mmHg
AV Peak grad: 45.3 mmHg
Ao pk vel: 3.37 m/s
Area-P 1/2: 4.31 cm2
Calc EF: 21.3 %
Height: 73 in
S' Lateral: 4.94 cm
Single Plane A2C EF: 11.6 %
Single Plane A4C EF: 30.9 %
Weight: 3200 oz

## 2023-04-10 LAB — TROPONIN I (HIGH SENSITIVITY)
Troponin I (High Sensitivity): 3446 ng/L (ref <18)
Troponin I (High Sensitivity): 5072 ng/L (ref <18)
Troponin I (High Sensitivity): 7083 ng/L (ref <18)
Troponin I (High Sensitivity): 9478 ng/L (ref <18)
Troponin I (High Sensitivity): 16162 ng/L (ref <18)

## 2023-04-10 LAB — HEMOGLOBIN A1C
Hgb A1c MFr Bld: 5.8 % — ABNORMAL HIGH (ref 4.8–5.6)
Mean Plasma Glucose: 119.76 mg/dL

## 2023-04-10 LAB — MRSA NEXT GEN BY PCR, NASAL: MRSA by PCR Next Gen: NOT DETECTED

## 2023-04-10 LAB — GLUCOSE, CAPILLARY
Glucose-Capillary: 82 mg/dL (ref 70–99)
Glucose-Capillary: 103 mg/dL — ABNORMAL HIGH (ref 70–99)

## 2023-04-10 LAB — LACTIC ACID, PLASMA: Lactic Acid, Venous: 1.2 mmol/L (ref 0.5–1.9)

## 2023-04-10 LAB — CG4 I-STAT (LACTIC ACID): Lactic Acid, Venous: 1.3 mmol/L (ref 0.5–1.9)

## 2023-04-10 LAB — PHOSPHORUS: Phosphorus: 5.9 mg/dL — ABNORMAL HIGH (ref 2.5–4.6)

## 2023-04-10 LAB — MAGNESIUM: Magnesium: 2 mg/dL (ref 1.7–2.4)

## 2023-04-10 SURGERY — RIGHT HEART CATH AND CORONARY ANGIOGRAPHY
Anesthesia: Moderate Sedation

## 2023-04-10 MED ORDER — ONDANSETRON HCL 4 MG/2ML IJ SOLN
4.0000 mg | Freq: Four times a day (QID) | INTRAMUSCULAR | Status: DC | PRN
  Administered 2023-04-10: 4 mg via INTRAVENOUS

## 2023-04-10 MED ORDER — SODIUM CHLORIDE 0.9 % IV SOLN: 250.0000 mL | INTRAVENOUS | Status: DC | PRN

## 2023-04-10 MED ORDER — ONDANSETRON 4 MG PO TBDP
4.0000 mg | ORAL_TABLET | Freq: Once | ORAL | Status: AC
  Administered 2023-04-10: 4 mg via ORAL

## 2023-04-10 MED ORDER — CHLORHEXIDINE GLUCONATE CLOTH 2 % EX PADS: 6.0000 | MEDICATED_PAD | Freq: Every day | CUTANEOUS | Status: DC

## 2023-04-10 MED ORDER — ONDANSETRON HCL 4 MG/2ML IJ SOLN
4.0000 mg | Freq: Three times a day (TID) | INTRAMUSCULAR | Status: DC | PRN
  Administered 2023-04-15: 4 mg via INTRAVENOUS
  Filled 2023-04-10 (×2): qty 2

## 2023-04-10 MED ORDER — SENNOSIDES-DOCUSATE SODIUM 8.6-50 MG PO TABS
1.0000 | ORAL_TABLET | Freq: Two times a day (BID) | ORAL | Status: DC
  Administered 2023-04-10 (×2): 1 via ORAL
  Filled 2023-04-10 (×2): qty 1

## 2023-04-10 MED ORDER — LIDOCAINE HCL 1 % IJ SOLN
INTRAMUSCULAR | Status: AC
  Filled 2023-04-10: qty 20

## 2023-04-10 MED ORDER — SODIUM CHLORIDE 0.9% FLUSH: 3.0000 mL | INTRAVENOUS | Status: DC | PRN

## 2023-04-10 MED ORDER — LIDOCAINE HCL (PF) 1 % IJ SOLN
INTRAMUSCULAR | Status: DC | PRN
  Administered 2023-04-10: 2 mL

## 2023-04-10 MED ORDER — AMOXICILLIN-POT CLAVULANATE 500-125 MG PO TABS
1.0000 | ORAL_TABLET | Freq: Every day | ORAL | Status: DC
  Administered 2023-04-10: 1 via ORAL
  Filled 2023-04-10: qty 1

## 2023-04-10 MED ORDER — HEPARIN (PORCINE) 25000 UT/250ML-% IV SOLN: 1100.0000 [IU]/h | INTRAVENOUS | Status: DC

## 2023-04-10 MED ORDER — CHLORHEXIDINE GLUCONATE CLOTH 2 % EX PADS
6.0000 | MEDICATED_PAD | Freq: Every day | CUTANEOUS | Status: DC
  Administered 2023-04-10 – 2023-04-16 (×7): 6 via TOPICAL

## 2023-04-10 MED ORDER — CINACALCET HCL 30 MG PO TABS
60.0000 mg | ORAL_TABLET | Freq: Every day | ORAL | Status: DC
  Administered 2023-04-10: 60 mg via ORAL
  Filled 2023-04-10 (×2): qty 2

## 2023-04-10 MED ORDER — ONDANSETRON HCL 4 MG/2ML IJ SOLN
INTRAMUSCULAR | Status: AC
  Filled 2023-04-10: qty 2

## 2023-04-10 MED ORDER — ASPIRIN 81 MG PO TBEC
81.0000 mg | DELAYED_RELEASE_TABLET | Freq: Every day | ORAL | Status: DC
  Administered 2023-04-10: 81 mg via ORAL
  Filled 2023-04-10: qty 1

## 2023-04-10 MED ORDER — HEPARIN (PORCINE) IN NACL 2000-0.9 UNIT/L-% IV SOLN
INTRAVENOUS | Status: DC | PRN
  Administered 2023-04-10: 1000 mL

## 2023-04-10 MED ORDER — SODIUM CHLORIDE 0.9% FLUSH: 3.0000 mL | Freq: Two times a day (BID) | INTRAVENOUS | Status: DC

## 2023-04-10 MED ORDER — HEPARIN BOLUS VIA INFUSION
4000.0000 [IU] | Freq: Once | INTRAVENOUS | Status: AC
  Administered 2023-04-10: 4000 [IU] via INTRAVENOUS
  Filled 2023-04-10: qty 4000

## 2023-04-10 MED ORDER — SODIUM ZIRCONIUM CYCLOSILICATE 10 G PO PACK
10.0000 g | PACK | Freq: Once | ORAL | Status: AC
  Administered 2023-04-10: 10 g via ORAL
  Filled 2023-04-10: qty 1

## 2023-04-10 MED ORDER — HYDRALAZINE HCL 20 MG/ML IJ SOLN: 10.0000 mg | INTRAMUSCULAR | Status: DC | PRN

## 2023-04-10 MED ORDER — ASPIRIN 81 MG PO CHEW: 81.0000 mg | CHEWABLE_TABLET | ORAL | Status: DC

## 2023-04-10 MED ORDER — HEPARIN (PORCINE) 25000 UT/250ML-% IV SOLN
1100.0000 [IU]/h | INTRAVENOUS | Status: DC
  Administered 2023-04-10: 1100 [IU]/h via INTRAVENOUS
  Filled 2023-04-10: qty 250

## 2023-04-10 MED ORDER — SODIUM CHLORIDE 0.9 % IV SOLN: INTRAVENOUS | Status: DC

## 2023-04-10 MED ORDER — ATORVASTATIN CALCIUM 20 MG PO TABS
40.0000 mg | ORAL_TABLET | Freq: Every day | ORAL | Status: DC
  Administered 2023-04-10: 40 mg via ORAL
  Filled 2023-04-10: qty 2

## 2023-04-10 SURGICAL SUPPLY — 12 items
CATH INFINITI 5FR MULTPACK ANG (CATHETERS) IMPLANT
CATH SWAN GANZ 7F STRAIGHT (CATHETERS) IMPLANT
DEVICE CLOSURE MYNXGRIP 5F (Vascular Products) IMPLANT
KIT MICROPUNCTURE NIT STIFF (SHEATH) IMPLANT
PACK CARDIAC CATH (CUSTOM PROCEDURE TRAY) ×1 IMPLANT
PAD ELECT DEFIB RADIOL ZOLL (MISCELLANEOUS) IMPLANT
PROTECTION STATION PRESSURIZED (MISCELLANEOUS) ×1
SET ATX-X65L (MISCELLANEOUS) IMPLANT
SHEATH AVANTI 5FR X 11CM (SHEATH) IMPLANT
SHEATH AVANTI 7FRX11 (SHEATH) IMPLANT
STATION PROTECTION PRESSURIZED (MISCELLANEOUS) IMPLANT
WIRE EMERALD 3MM-J .035X150CM (WIRE) IMPLANT

## 2023-04-10 NOTE — Progress Notes (Signed)
ANTICOAGULATION CONSULT NOTE - Initial Consult  Pharmacy Consult for Heparin  Indication: chest pain/ACS  No Known Allergies  Patient Measurements: Height: 6\' 1"  (185.4 cm) Weight: 90.7 kg (200 lb) IBW/kg (Calculated) : 79.9 Heparin Dosing Weight: 90.7 kg   Vital Signs: Temp: 98.3 F (36.8 C) (09/24 0147) Temp Source: Oral (09/24 0147) BP: 104/63 (09/24 1130) Pulse Rate: 86 (09/24 1130)  Labs: Recent Labs    04/09/23 2130 04/12/2023 0208 03/29/2023 0509 04/13/2023 0826 03/26/2023 1018  HGB 10.7* 10.2*  --   --   --   HCT 33.4* 31.6*  --   --   --   PLT 85* 81*  --   --   --   APTT 33  --   --   --   --   LABPROT 16.4*  --   --   --   --   INR 1.3*  --   --   --   --   CREATININE 5.11* 5.69*  --   --   --   TROPONINIHS 2,621* 3,446* 5,072* 7,083* 9,478*    Estimated Creatinine Clearance: 11.3 mL/min (A) (by C-G formula based on SCr of 5.69 mg/dL (H)).   Medical History: Past Medical History:  Diagnosis Date   Cancer (HCC) 2019   colon   Chronic kidney disease    Diabetes mellitus without complication (HCC)    diet controlled   Hemodialysis access site with arteriovenous graft (HCC)    Hypertension    Infection due to Clostridium septicum (HCC) 08/27/2018   Renal insufficiency    Pt on dialysis x 3 years and receives every Monday, Wednesday and Friday.    Assessment: Jonathan Mcgrath is a 82 y.o. male presenting with chest pain. PMH significant for HTN, T2DM, ESRD on HD (MWF), thrombocytopenia, CAD, CHF, colon cancer. Patient was not on [facility] PTA per chart review. Patient was previously on heparin infusion this admission, but it was stopped due to patient having recent bleeding into eye. Heparin infusion resumed per cardiology. hsTrop elevated to 9478. Pharmacy has been consulted to initiate and manage heparin infusion.   Baseline Labs: aPTT 33, PT 16.4, INR 1.3, Hgb 10.7, Hct 33.4, Plt 85   Goal of Therapy:  Heparin level 0.3-0.7 units/ml Monitor platelets by  anticoagulation protocol: Yes   Plan:  Give 4000 units bolus x 1 Start heparin infusion at 1100 units/hr Check HL in 8 hours  Continue to monitor H&H and platelets daily while on heparin infusion   Celene Squibb, PharmD Clinical Pharmacist 03/28/2023 12:18 PM

## 2023-04-10 NOTE — Consult Note (Addendum)
NAME:  Jonathan Mcgrath, MRN:  [id number], DOB:  11/18/1940, LOS: 0 ADMISSION DATE:  04/12/2023, CONSULTATION DATE:  04/04/2023 REFERRING MD:  End - [facility] CHIEF COMPLAINT:  Weakness, NSTEMI  History of Present Illness:  82 year old man admitted to [facility] 9/24 as a transfer from [facility] for NSTEMI and low flow state. PMHx significant for HTN, CHF (Echo 9/24 EF 20-25%), aortitis (on Augmentin indefinitely, reportedly has not taken this for months), CVA, T2DM, ESRD (on HD MWF via LUE AVF), colon CA s/p hemicolectomy (2019).   Patient was initially admitted to [facility] 9/23 with weakness x 1 week. He was found to have NSTEMI and acute HFrEF c/b low flow/low gradient AS. He was started on Heparin infusion (of note, history of vitreous hemorrhage of eyes 3 months ago; risks/benefits of heparin were apparently discussed with ophthalmology and it was felt benefit outweighed risk). Plan was for volume removal via HD given his ESRD status.  Echo 9/24 demonstrated EF 20-25% with global hypokinesis and septal dyskinesis related to LBBB, mildly dilated RV with mod reduced fx, mod to severe PAH, small pericardial effusion, AV severely thickened with TR, mod to severe TR and mod MR. 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 HD was attempted 9/24, however due to hypotension patient was only able to tolerate 2L fluid removal prior to cessation.  Decision to transfer patient to [facility] for further Cardiology input, TCTS consultation and possible initiation of CRRT initiation. PCCM consulted 9/24 for assistance with management.  Pertinent Medical History:   Past Medical History:  Diagnosis Date   Cancer (HCC) 2019   colon   Chronic kidney disease    Diabetes mellitus without complication (HCC)    diet controlled   Hemodialysis access site with arteriovenous graft (HCC)     Hypertension    Infection due to Clostridium septicum (HCC) 08/27/2018   Renal insufficiency    Pt on dialysis x 3 years and receives every Monday, Wednesday and Friday.    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 Transfer to [facility] CVICU.  Interim History / Subjective:  PCCM consulted for assistance with management.  Objective:  Blood pressure (!) 80/64, pulse 86, resp. rate (!) 22, SpO2 97%.       No intake or output data in the 24 hours ending 03/27/2023 2043 There were no vitals filed for this visit.  Physical Examination: General: Acute-on-chronically ill-appearing elderly man in NAD. Pleasant and conversant, mildly HOH. HEENT: [place]/AT, anicteric sclera, PERRL, moist mucous membranes. Neuro: Awake, oriented x 4. Responds to verbal stimuli. Following commands consistently. Moves all 4 extremities spontaneously. Generalized weakness. CV: RRR, +III/VI loud crescendo/decrescendo murmur. PULM: Breathing even and unlabored on 4LNC. Lung fields diminished bilaterally, faint bibasilar crackles. GI: Soft, nontender, nondistended. Normoactive bowel sounds. Extremities: Trace bilateral symmetric LE edema noted. LUE AVF with +thrill. Skin: Warm/dry, no rashes.  Labs/imaging personally reviewed:  Echo 9/24 > EF 20-25% with global HK and septal dyskinesis related to LBBB, mildly dilated RV with mod reduced fx, mod to severe PAH, small pericardial effusion, AV severely thickened with TR, mod to severe TR and mod MR. [facility]  NAME:  Jonathan Mcgrath, MRN:  [id number], DOB:  11/18/1940, LOS: 0 ADMISSION DATE:  04/16/2023, CONSULTATION DATE:  04/07/2023 REFERRING MD:  End - [facility] CHIEF COMPLAINT:  Weakness, NSTEMI  History of Present Illness:  82 year old man admitted to [facility] 9/24 as a transfer from [facility] for NSTEMI and low flow state. PMHx significant for HTN, CHF (Echo 9/24 EF 20-25%), aortitis (on Augmentin indefinitely, reportedly has not taken this for months), CVA, T2DM, ESRD (on HD MWF via LUE AVF), colon CA s/p hemicolectomy (2019).   Patient was initially admitted to [facility] 9/23 with weakness x 1 week. He was found to have NSTEMI and acute HFrEF c/b low flow/low gradient AS. He was started on Heparin infusion (of note, history of vitreous hemorrhage of eyes 3 months ago; risks/benefits of heparin were apparently discussed with ophthalmology and it was felt benefit outweighed risk). Plan was for volume removal via HD given his ESRD status.  Echo 9/24 demonstrated EF 20-25% with global hypokinesis and septal dyskinesis related to LBBB, mildly dilated RV with mod reduced fx, mod to severe PAH, small pericardial effusion, AV severely thickened with TR, mod to severe TR and mod MR. 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 HD was attempted 9/24, however due to hypotension patient was only able to tolerate 2L fluid removal prior to cessation.  Decision to transfer patient to [facility] for further Cardiology input, TCTS consultation and possible initiation of CRRT initiation. PCCM consulted 9/24 for assistance with management.  Pertinent Medical History:   Past Medical History:  Diagnosis Date   Cancer (HCC) 2019   colon   Chronic kidney disease    Diabetes mellitus without complication (HCC)    diet controlled   Hemodialysis access site with arteriovenous graft (HCC)     Hypertension    Infection due to Clostridium septicum (HCC) 08/27/2018   Renal insufficiency    Pt on dialysis x 3 years and receives every Monday, Wednesday and Friday.    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 Transfer to [facility] CVICU.  Interim History / Subjective:  PCCM consulted for assistance with management.  Objective:  Blood pressure (!) 80/64, pulse 86, resp. rate (!) 22, SpO2 97%.       No intake or output data in the 24 hours ending 04/12/2023 2043 There were no vitals filed for this visit.  Physical Examination: General: Acute-on-chronically ill-appearing elderly man in NAD. Pleasant and conversant, mildly HOH. HEENT: [place]/AT, anicteric sclera, PERRL, moist mucous membranes. Neuro: Awake, oriented x 4. Responds to verbal stimuli. Following commands consistently. Moves all 4 extremities spontaneously. Generalized weakness. CV: RRR, +III/VI loud crescendo/decrescendo murmur. PULM: Breathing even and unlabored on 4LNC. Lung fields diminished bilaterally, faint bibasilar crackles. GI: Soft, nontender, nondistended. Normoactive bowel sounds. Extremities: Trace bilateral symmetric LE edema noted. LUE AVF with +thrill. Skin: Warm/dry, no rashes.  Labs/imaging personally reviewed:  Echo 9/24 > EF 20-25% with global HK and septal dyskinesis related to LBBB, mildly dilated RV with mod reduced fx, mod to severe PAH, small pericardial effusion, AV severely thickened with TR, mod to severe TR and mod MR. [facility]  NAME:  Jonathan Mcgrath, MRN:  [id number], DOB:  11/18/1940, LOS: 0 ADMISSION DATE:  04/16/2023, CONSULTATION DATE:  04/07/2023 REFERRING MD:  End - [facility] CHIEF COMPLAINT:  Weakness, NSTEMI  History of Present Illness:  82 year old man admitted to [facility] 9/24 as a transfer from [facility] for NSTEMI and low flow state. PMHx significant for HTN, CHF (Echo 9/24 EF 20-25%), aortitis (on Augmentin indefinitely, reportedly has not taken this for months), CVA, T2DM, ESRD (on HD MWF via LUE AVF), colon CA s/p hemicolectomy (2019).   Patient was initially admitted to [facility] 9/23 with weakness x 1 week. He was found to have NSTEMI and acute HFrEF c/b low flow/low gradient AS. He was started on Heparin infusion (of note, history of vitreous hemorrhage of eyes 3 months ago; risks/benefits of heparin were apparently discussed with ophthalmology and it was felt benefit outweighed risk). Plan was for volume removal via HD given his ESRD status.  Echo 9/24 demonstrated EF 20-25% with global hypokinesis and septal dyskinesis related to LBBB, mildly dilated RV with mod reduced fx, mod to severe PAH, small pericardial effusion, AV severely thickened with TR, mod to severe TR and mod MR. 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 HD was attempted 9/24, however due to hypotension patient was only able to tolerate 2L fluid removal prior to cessation.  Decision to transfer patient to [facility] for further Cardiology input, TCTS consultation and possible initiation of CRRT initiation. PCCM consulted 9/24 for assistance with management.  Pertinent Medical History:   Past Medical History:  Diagnosis Date   Cancer (HCC) 2019   colon   Chronic kidney disease    Diabetes mellitus without complication (HCC)    diet controlled   Hemodialysis access site with arteriovenous graft (HCC)     Hypertension    Infection due to Clostridium septicum (HCC) 08/27/2018   Renal insufficiency    Pt on dialysis x 3 years and receives every Monday, Wednesday and Friday.    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 Transfer to [facility] CV[facility].  Interim History / Subjective:  PCCM consulted for assistance with management.  Objective:  Blood pressure (!) 80/64, pulse 86, resp. rate (!) 22, SpO2 97%.       No intake or output data in the 24 hours ending 04/12/2023 2043 There were no vitals filed for this visit.  Physical Examination: General: Acute-on-chronically ill-appearing elderly man in NAD. Pleasant and conversant, mildly HOH. HEENT: [place]/AT, anicteric sclera, PERRL, moist mucous membranes. Neuro: Awake, oriented x 4. Responds to verbal stimuli. Following commands consistently. Moves all 4 extremities spontaneously. Generalized weakness. CV: RRR, +III/VI loud crescendo/decrescendo murmur. PULM: Breathing even and unlabored on 4LNC. Lung fields diminished bilaterally, faint bibasilar crackles. GI: Soft, nontender, nondistended. Normoactive bowel sounds. Extremities: Trace bilateral symmetric LE edema noted. LUE AVF with +thrill. Skin: Warm/dry, no rashes.  Labs/imaging personally reviewed:  Echo 9/24 > EF 20-25% with global HK and septal dyskinesis related to LBBB, mildly dilated RV with mod reduced fx, mod to severe PAH, small pericardial effusion, AV severely thickened with TR, mod to severe TR and mod MR. [facility]  NAME:  Jonathan Mcgrath, MRN:  [id number], DOB:  11/18/1940, LOS: 0 ADMISSION DATE:  04/12/2023, CONSULTATION DATE:  04/04/2023 REFERRING MD:  End - [facility] CHIEF COMPLAINT:  Weakness, NSTEMI  History of Present Illness:  82 year old man admitted to [facility] 9/24 as a transfer from [facility] for NSTEMI and low flow state. PMHx significant for HTN, CHF (Echo 9/24 EF 20-25%), aortitis (on Augmentin indefinitely, reportedly has not taken this for months), CVA, T2DM, ESRD (on HD MWF via LUE AVF), colon CA s/p hemicolectomy (2019).   Patient was initially admitted to [facility] 9/23 with weakness x 1 week. He was found to have NSTEMI and acute HFrEF c/b low flow/low gradient AS. He was started on Heparin infusion (of note, history of vitreous hemorrhage of eyes 3 months ago; risks/benefits of heparin were apparently discussed with ophthalmology and it was felt benefit outweighed risk). Plan was for volume removal via HD given his ESRD status.  Echo 9/24 demonstrated EF 20-25% with global hypokinesis and septal dyskinesis related to LBBB, mildly dilated RV with mod reduced fx, mod to severe PAH, small pericardial effusion, AV severely thickened with TR, mod to severe TR and mod MR. 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 HD was attempted 9/24, however due to hypotension patient was only able to tolerate 2L fluid removal prior to cessation.  Decision to transfer patient to [facility] for further Cardiology input, TCTS consultation and possible initiation of CRRT initiation. PCCM consulted 9/24 for assistance with management.  Pertinent Medical History:   Past Medical History:  Diagnosis Date   Cancer (HCC) 2019   colon   Chronic kidney disease    Diabetes mellitus without complication (HCC)    diet controlled   Hemodialysis access site with arteriovenous graft (HCC)     Hypertension    Infection due to Clostridium septicum (HCC) 08/27/2018   Renal insufficiency    Pt on dialysis x 3 years and receives every Monday, Wednesday and Friday.    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 Transfer to [facility].  Interim History / Subjective:  PCCM consulted for assistance with management.  Objective:  Blood pressure (!) 80/64, pulse 86, resp. rate (!) 22, SpO2 97%.       No intake or output data in the 24 hours ending 03/27/2023 2043 There were no vitals filed for this visit.  Physical Examination: General: Acute-on-chronically ill-appearing elderly man in NAD. Pleasant and conversant, mildly HOH. HEENT: [place]/AT, anicteric sclera, PERRL, moist mucous membranes. Neuro: Awake, oriented x 4. Responds to verbal stimuli. Following commands consistently. Moves all 4 extremities spontaneously. Generalized weakness. CV: RRR, +III/VI loud crescendo/decrescendo murmur. PULM: Breathing even and unlabored on 4LNC. Lung fields diminished bilaterally, faint bibasilar crackles. GI: Soft, nontender, nondistended. Normoactive bowel sounds. Extremities: Trace bilateral symmetric LE edema noted. LUE AVF with +thrill. Skin: Warm/dry, no rashes.  Labs/imaging personally reviewed:  Echo 9/24 > EF 20-25% with global HK and septal dyskinesis related to LBBB, mildly dilated RV with mod reduced fx, mod to severe PAH, small pericardial effusion, AV severely thickened with TR, mod to severe TR and mod MR. [facility]  NAME:  Jonathan Mcgrath, MRN:  [id number], DOB:  11/18/1940, LOS: 0 ADMISSION DATE:  04/12/2023, CONSULTATION DATE:  04/04/2023 REFERRING MD:  End - [facility] CHIEF COMPLAINT:  Weakness, NSTEMI  History of Present Illness:  82 year old man admitted to [facility] 9/24 as a transfer from [facility] for NSTEMI and low flow state. PMHx significant for HTN, CHF (Echo 9/24 EF 20-25%), aortitis (on Augmentin indefinitely, reportedly has not taken this for months), CVA, T2DM, ESRD (on HD MWF via LUE AVF), colon CA s/p hemicolectomy (2019).   Patient was initially admitted to [facility] 9/23 with weakness x 1 week. He was found to have NSTEMI and acute HFrEF c/b low flow/low gradient AS. He was started on Heparin infusion (of note, history of vitreous hemorrhage of eyes 3 months ago; risks/benefits of heparin were apparently discussed with ophthalmology and it was felt benefit outweighed risk). Plan was for volume removal via HD given his ESRD status.  Echo 9/24 demonstrated EF 20-25% with global hypokinesis and septal dyskinesis related to LBBB, mildly dilated RV with mod reduced fx, mod to severe PAH, small pericardial effusion, AV severely thickened with TR, mod to severe TR and mod MR. 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 HD was attempted 9/24, however due to hypotension patient was only able to tolerate 2L fluid removal prior to cessation.  Decision to transfer patient to [facility] for further Cardiology input, TCTS consultation and possible initiation of CRRT initiation. PCCM consulted 9/24 for assistance with management.  Pertinent Medical History:   Past Medical History:  Diagnosis Date   Cancer (HCC) 2019   colon   Chronic kidney disease    Diabetes mellitus without complication (HCC)    diet controlled   Hemodialysis access site with arteriovenous graft (HCC)     Hypertension    Infection due to Clostridium septicum (HCC) 08/27/2018   Renal insufficiency    Pt on dialysis x 3 years and receives every Monday, Wednesday and Friday.    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 Transfer to [facility] CV[facility].  Interim History / Subjective:  PCCM consulted for assistance with management.  Objective:  Blood pressure (!) 80/64, pulse 86, resp. rate (!) 22, SpO2 97%.       No intake or output data in the 24 hours ending 03/27/2023 2043 There were no vitals filed for this visit.  Physical Examination: General: Acute-on-chronically ill-appearing elderly man in NAD. Pleasant and conversant, mildly HOH. HEENT: [place]/AT, anicteric sclera, PERRL, moist mucous membranes. Neuro: Awake, oriented x 4. Responds to verbal stimuli. Following commands consistently. Moves all 4 extremities spontaneously. Generalized weakness. CV: RRR, +III/VI loud crescendo/decrescendo murmur. PULM: Breathing even and unlabored on 4LNC. Lung fields diminished bilaterally, faint bibasilar crackles. GI: Soft, nontender, nondistended. Normoactive bowel sounds. Extremities: Trace bilateral symmetric LE edema noted. LUE AVF with +thrill. Skin: Warm/dry, no rashes.  Labs/imaging personally reviewed:  Echo 9/24 > EF 20-25% with global HK and septal dyskinesis related to LBBB, mildly dilated RV with mod reduced fx, mod to severe PAH, small pericardial effusion, AV severely thickened with TR, mod to severe TR and mod MR. [facility]

## 2023-04-10 NOTE — Consult Note (Signed)
CCM PHONE CONSULT FOR TRANSFER    Call from Dr Cristal Deer END of cardiology at [facility] 10/24/1940 - 82 y.o.   Male . Last week x unewll. ESRD - yesterday HD not done. Trop yesterday 2K and then up to 10K. No chest pain. Just nausea and weak. EF new drop to 25%. Severe AS   Cath - severe disease with PCWP 30   has a past medical history of Cancer (HCC) (2019), Chronic kidney disease, Diabetes mellitus without complication (HCC), Hemodialysis access site with arteriovenous graft [facility]), Hypertension, Infection due to Clostridium septicum (HCC) (08/27/2018), and Renal insufficiency.   has a past surgical history that includes AV fistula placement (Left, 2015); Peritoneal catheter insertion (N/A); Removal of a dialysis catheter (N/A, 01/14/2015); Cardiac catheterization (Left, 01/12/2016); Cardiac catheterization (N/A, 01/12/2016); Esophagogastroduodenoscopy (N/A, 04/18/2018); Colonoscopy (N/A, 04/18/2018); CENTRAL LINE INSERTION-TUNNELED (N/A, 04/19/2018); TEE without cardioversion (N/A, 04/19/2018); Cataract extraction w/ intraocular lens  implant, bilateral (Bilateral, 08/2017); Eye surgery; Colon resection (N/A, 05/16/2018); DIALYSIS/PERMA CATHETER REMOVAL (N/A, 06/19/2018); PERIPHERAL VASCULAR BALLOON ANGIOPLASTY (Bilateral, 07/15/2018); Central venous catheter insertion (07/15/2018); PELVIC ANGIOGRAPHY (07/15/2018); DIALYSIS/PERMA CATHETER REMOVAL (N/A, 09/24/2018); and Colonoscopy with propofol (N/A, 05/22/2019).   SBP 90s  A MI sCHF Volume overloaded with high PCWP CAD - needs CABG but high risk Severe AS   ESRD   Plan - d/w DR End  - tx to [facility] CVICU under cardioogy primary - CCM consult - might need HD cath for CRRT  - will nee renal consul;t  - placing on CCM rounding list in anticipation of arrival from [facility]      SIGNATURE    Dr. Kalman Shan, M.D., F.C.C.P,  Pulmonary and Critical Care Medicine Staff Physician, [facility] Director - Interstitial Lung Disease  Program  Pulmonary Fibrosis [facility] at [facility] [place], [place], [address]   Pager: [phone], If no answer  -> Check AMION or Try [phone] Telephone (clinical office): [phone] Telephone (research): [phone]  5:50 PM 03/19/2023

## 2023-04-10 NOTE — Progress Notes (Signed)
PROGRESS NOTE    Jonathan Mcgrath  XBM:[id number] DOB: 07-01-1941 DOA: 04/09/2023 PCP: Estell Harpin, MD    Brief Narrative:  82 y.o. 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   Pt states that he has not been feeling well in the past 3 days.  He has  generalized weakness and fatigue. He had dialysis today, but due to low blood pressure, patient only had 2 L of fluid removed per patient.  He has oxygen desaturation to 89% on room air, which improved to 94% on 4 L oxygen, but denies chest pain, cough, shortness of breath.  Patient has nausea and vomited twice earlier, denies abdominal pain or diarrhea.  No symptoms of UTI.  Denies rectal bleeding or dark stool.   Of note, patient has history of aortitis, and is supposed to take antibiotics indefinitely, but states that has not taken his Augmentin for several months.  9/24: Case discussed with cardiology.  Troponins continue to uptrend.  Patient without chest pain.  I discussed the previous concern for vitreous hemorrhage with patient and son at bedside.  Etiology of event is unclear.  Cannot find documentation to support.  However patient states his episode was several months ago and he has not had any issues or recurrence of bleeding.   Assessment & Plan:   Principal Problem:   NSTEMI (non-ST elevated myocardial infarction) (HCC) Active Problems:   ESRD on hemodialysis (HCC)   Chronic diastolic CHF (congestive heart failure) (HCC)   Essential hypertension   Elevated lactic acid level   Anemia in ESRD (end-stage renal disease) (HCC)   Thrombocytopenia (HCC)   Aortitis (HCC)   Stroke (HCC)  NSTEMI (non-ST elevated myocardial infarction) (HCC): Troponins up trended to a max of 9400.  Not yet peaked.  No chest pain.  IV heparin was initially ordered  by EDP however held on admission due to concern for recent vitreous hemorrhage.  I discussed this with the patient.  This event is of unclear etiology and was several months ago.  At this time additional benefits of heparinization outweigh risks.  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  Has new oxygen requirement currently on 4 L.  Potassium elevated to 5.6 Plan: Nephrology following for HD   Chronic diastolic CHF (congestive heart failure) (HCC): 2D echo on 07/13/2022 showed EF of 55 to 60% with grade 2 diastolic dysfunction.  Patient has fluid overload now. Nephrology following for HD   Essential hypertension:  blood pressure soft.  Patient is not taking medications currently    Elevated lactic acid level:  Lactic acid 2.2.  No signs of infection.  No leukocytosis.  Likely due to hypoxia.    Anemia in ESRD (end-stage renal disease) (HCC):  Hemoglobin 10.7 (12.1 on 03/28/2023).  No active bleeding.    Thrombocytopenia (HCC):  This is chronic issue.  Platelet 85.  No active bleeding    Aortitis (HCC): Patient is supposed to take Augmentin definitely, but stopped taking this medication few months ago -Resumed Augmentin   Stroke (HCC) -Aspirin and Lipitor  History of vitreous hemorrhage This was initial concern and I discussed with patient.  Event  Patient Name:   Jonathan Mcgrath Date of Exam: 04/13/2023 Medical Rec #:  [id number]           Height:       73.0 in Accession #:    [id number]          Weight:       200.0 lb Date of Birth:  [birth year]            BSA:          2.152 m Patient Age:    82 years            BP:           104/63 mmHg Patient Gender: M                   HR:           86 bpm. Exam Location:  [facility] Procedure: 2D Echo, Cardiac Doppler, Color Doppler and Strain Analysis Indications:     NSTEMI I21.4  History:         Patient has prior history of Echocardiogram examinations, most                  recent 07/13/2022. Risk Factors:Hypertension and Diabetes.  Sonographer:     Cristela Blue Referring Phys:  [id number] SHERI HAMMOCK Diagnosing Phys: Yvonne Kendall MD  Sonographer Comments: Global longitudinal strain was attempted. IMPRESSIONS  1. Left ventricular ejection fraction, by estimation, is 20 to 25%. The left ventricle has severely decreased function. The left ventricle demonstrates global hypokinesis. The left ventricular internal cavity size was mildly dilated. There is mild left ventricular  hypertrophy. Left ventricular diastolic parameters are indeterminate. The average left ventricular global longitudinal strain is -5.3 %. The global longitudinal strain is abnormal.  2. 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 Right ventricular systolic function is moderately reduced. The right ventricular size is mildly enlarged.  3. Left atrial size was mildly dilated.  4. A small pericardial effusion is present.  5. The mitral valve is normal in structure. Moderate mitral valve regurgitation.  6. Tricuspid valve regurgitation is moderate to severe.  7. The aortic valve has an indeterminant number of cusps. There is severe calcifcation of the aortic valve. There is severe thickening of the aortic valve. Aortic valve regurgitation is trivial. There is severe low-flow/low-gradient aortic valve stenosis. Comparison(s): A prior study was performed on 07/13/2022. The left ventricular function is significantly worse. Aortic valve stenosis has also progressed. FINDINGS  Left Ventricle: Left ventricular ejection fraction, by estimation, is 20 to 25%. The left ventricle has severely decreased function. The left ventricle demonstrates global hypokinesis. The average left ventricular global longitudinal strain is -5.3 %. The global longitudinal strain is abnormal. The left ventricular internal cavity size was mildly dilated. There is mild left ventricular hypertrophy. Abnormal (paradoxical) septal motion, consistent with left bundle branch block. Left ventricular diastolic parameters are indeterminate. Right Ventricle: There is moderate to severe pulmonary hypertension (RVSP 55-60 mmHg plus central venous/right atrial pressure). The right ventricular size is mildly enlarged. No increase in right ventricular wall thickness. Right ventricular systolic function is moderately reduced. Left Atrium: Left atrial size was mildly dilated. Right Atrium: Right atrial  size was normal in size. Pericardium: A small pericardial effusion is present. Mitral Valve: The mitral valve is normal in structure. Moderate mitral valve regurgitation. Tricuspid Valve: The tricuspid valve is normal in structure. Tricuspid valve regurgitation is moderate to severe.  Patient Name:   Jonathan Mcgrath Date of Exam: 04/13/2023 Medical Rec #:  [id number]           Height:       73.0 in Accession #:    [id number]          Weight:       200.0 lb Date of Birth:  [birth year]            BSA:          2.152 m Patient Age:    82 years            BP:           104/63 mmHg Patient Gender: M                   HR:           86 bpm. Exam Location:  [facility] Procedure: 2D Echo, Cardiac Doppler, Color Doppler and Strain Analysis Indications:     NSTEMI I21.4  History:         Patient has prior history of Echocardiogram examinations, most                  recent 07/13/2022. Risk Factors:Hypertension and Diabetes.  Sonographer:     Cristela Blue Referring Phys:  [id number] SHERI HAMMOCK Diagnosing Phys: Yvonne Kendall MD  Sonographer Comments: Global longitudinal strain was attempted. IMPRESSIONS  1. Left ventricular ejection fraction, by estimation, is 20 to 25%. The left ventricle has severely decreased function. The left ventricle demonstrates global hypokinesis. The left ventricular internal cavity size was mildly dilated. There is mild left ventricular  hypertrophy. Left ventricular diastolic parameters are indeterminate. The average left ventricular global longitudinal strain is -5.3 %. The global longitudinal strain is abnormal.  2. 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 Right ventricular systolic function is moderately reduced. The right ventricular size is mildly enlarged.  3. Left atrial size was mildly dilated.  4. A small pericardial effusion is present.  5. The mitral valve is normal in structure. Moderate mitral valve regurgitation.  6. Tricuspid valve regurgitation is moderate to severe.  7. The aortic valve has an indeterminant number of cusps. There is severe calcifcation of the aortic valve. There is severe thickening of the aortic valve. Aortic valve regurgitation is trivial. There is severe low-flow/low-gradient aortic valve stenosis. Comparison(s): A prior study was performed on 07/13/2022. The left ventricular function is significantly worse. Aortic valve stenosis has also progressed. FINDINGS  Left Ventricle: Left ventricular ejection fraction, by estimation, is 20 to 25%. The left ventricle has severely decreased function. The left ventricle demonstrates global hypokinesis. The average left ventricular global longitudinal strain is -5.3 %. The global longitudinal strain is abnormal. The left ventricular internal cavity size was mildly dilated. There is mild left ventricular hypertrophy. Abnormal (paradoxical) septal motion, consistent with left bundle branch block. Left ventricular diastolic parameters are indeterminate. Right Ventricle: There is moderate to severe pulmonary hypertension (RVSP 55-60 mmHg plus central venous/right atrial pressure). The right ventricular size is mildly enlarged. No increase in right ventricular wall thickness. Right ventricular systolic function is moderately reduced. Left Atrium: Left atrial size was mildly dilated. Right Atrium: Right atrial  size was normal in size. Pericardium: A small pericardial effusion is present. Mitral Valve: The mitral valve is normal in structure. Moderate mitral valve regurgitation. Tricuspid Valve: The tricuspid valve is normal in structure. Tricuspid valve regurgitation is moderate to severe.  Patient Name:   Jonathan Mcgrath Date of Exam: 04/13/2023 Medical Rec #:  [id number]           Height:       73.0 in Accession #:    [id number]          Weight:       200.0 lb Date of Birth:  [birth year]            BSA:          2.152 m Patient Age:    82 years            BP:           104/63 mmHg Patient Gender: M                   HR:           86 bpm. Exam Location:  [facility] Procedure: 2D Echo, Cardiac Doppler, Color Doppler and Strain Analysis Indications:     NSTEMI I21.4  History:         Patient has prior history of Echocardiogram examinations, most                  recent 07/13/2022. Risk Factors:Hypertension and Diabetes.  Sonographer:     Cristela Blue Referring Phys:  [id number] SHERI HAMMOCK Diagnosing Phys: Yvonne Kendall MD  Sonographer Comments: Global longitudinal strain was attempted. IMPRESSIONS  1. Left ventricular ejection fraction, by estimation, is 20 to 25%. The left ventricle has severely decreased function. The left ventricle demonstrates global hypokinesis. The left ventricular internal cavity size was mildly dilated. There is mild left ventricular  hypertrophy. Left ventricular diastolic parameters are indeterminate. The average left ventricular global longitudinal strain is -5.3 %. The global longitudinal strain is abnormal.  2. 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 Right ventricular systolic function is moderately reduced. The right ventricular size is mildly enlarged.  3. Left atrial size was mildly dilated.  4. A small pericardial effusion is present.  5. The mitral valve is normal in structure. Moderate mitral valve regurgitation.  6. Tricuspid valve regurgitation is moderate to severe.  7. The aortic valve has an indeterminant number of cusps. There is severe calcifcation of the aortic valve. There is severe thickening of the aortic valve. Aortic valve regurgitation is trivial. There is severe low-flow/low-gradient aortic valve stenosis. Comparison(s): A prior study was performed on 07/13/2022. The left ventricular function is significantly worse. Aortic valve stenosis has also progressed. FINDINGS  Left Ventricle: Left ventricular ejection fraction, by estimation, is 20 to 25%. The left ventricle has severely decreased function. The left ventricle demonstrates global hypokinesis. The average left ventricular global longitudinal strain is -5.3 %. The global longitudinal strain is abnormal. The left ventricular internal cavity size was mildly dilated. There is mild left ventricular hypertrophy. Abnormal (paradoxical) septal motion, consistent with left bundle branch block. Left ventricular diastolic parameters are indeterminate. Right Ventricle: There is moderate to severe pulmonary hypertension (RVSP 55-60 mmHg plus central venous/right atrial pressure). The right ventricular size is mildly enlarged. No increase in right ventricular wall thickness. Right ventricular systolic function is moderately reduced. Left Atrium: Left atrial size was mildly dilated. Right Atrium: Right atrial  size was normal in size. Pericardium: A small pericardial effusion is present. Mitral Valve: The mitral valve is normal in structure. Moderate mitral valve regurgitation. Tricuspid Valve: The tricuspid valve is normal in structure. Tricuspid valve regurgitation is moderate to severe.  Patient Name:   Jonathan Mcgrath Date of Exam: 04/13/2023 Medical Rec #:  [id number]           Height:       73.0 in Accession #:    [id number]          Weight:       200.0 lb Date of Birth:  [birth year]            BSA:          2.152 m Patient Age:    82 years            BP:           104/63 mmHg Patient Gender: M                   HR:           86 bpm. Exam Location:  [facility] Procedure: 2D Echo, Cardiac Doppler, Color Doppler and Strain Analysis Indications:     NSTEMI I21.4  History:         Patient has prior history of Echocardiogram examinations, most                  recent 07/13/2022. Risk Factors:Hypertension and Diabetes.  Sonographer:     Cristela Blue Referring Phys:  [id number] SHERI HAMMOCK Diagnosing Phys: Yvonne Kendall MD  Sonographer Comments: Global longitudinal strain was attempted. IMPRESSIONS  1. Left ventricular ejection fraction, by estimation, is 20 to 25%. The left ventricle has severely decreased function. The left ventricle demonstrates global hypokinesis. The left ventricular internal cavity size was mildly dilated. There is mild left ventricular  hypertrophy. Left ventricular diastolic parameters are indeterminate. The average left ventricular global longitudinal strain is -5.3 %. The global longitudinal strain is abnormal.  2. 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 Right ventricular systolic function is moderately reduced. The right ventricular size is mildly enlarged.  3. Left atrial size was mildly dilated.  4. A small pericardial effusion is present.  5. The mitral valve is normal in structure. Moderate mitral valve regurgitation.  6. Tricuspid valve regurgitation is moderate to severe.  7. The aortic valve has an indeterminant number of cusps. There is severe calcifcation of the aortic valve. There is severe thickening of the aortic valve. Aortic valve regurgitation is trivial. There is severe low-flow/low-gradient aortic valve stenosis. Comparison(s): A prior study was performed on 07/13/2022. The left ventricular function is significantly worse. Aortic valve stenosis has also progressed. FINDINGS  Left Ventricle: Left ventricular ejection fraction, by estimation, is 20 to 25%. The left ventricle has severely decreased function. The left ventricle demonstrates global hypokinesis. The average left ventricular global longitudinal strain is -5.3 %. The global longitudinal strain is abnormal. The left ventricular internal cavity size was mildly dilated. There is mild left ventricular hypertrophy. Abnormal (paradoxical) septal motion, consistent with left bundle branch block. Left ventricular diastolic parameters are indeterminate. Right Ventricle: There is moderate to severe pulmonary hypertension (RVSP 55-60 mmHg plus central venous/right atrial pressure). The right ventricular size is mildly enlarged. No increase in right ventricular wall thickness. Right ventricular systolic function is moderately reduced. Left Atrium: Left atrial size was mildly dilated. Right Atrium: Right atrial  size was normal in size. Pericardium: A small pericardial effusion is present. Mitral Valve: The mitral valve is normal in structure. Moderate mitral valve regurgitation. Tricuspid Valve: The tricuspid valve is normal in structure. Tricuspid valve regurgitation is moderate to severe.  PROGRESS NOTE    Jonathan Mcgrath  XBM:[id number] DOB: 07-01-1941 DOA: 04/09/2023 PCP: Estell Harpin, MD    Brief Narrative:  82 y.o. 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   Pt states that he has not been feeling well in the past 3 days.  He has  generalized weakness and fatigue. He had dialysis today, but due to low blood pressure, patient only had 2 L of fluid removed per patient.  He has oxygen desaturation to 89% on room air, which improved to 94% on 4 L oxygen, but denies chest pain, cough, shortness of breath.  Patient has nausea and vomited twice earlier, denies abdominal pain or diarrhea.  No symptoms of UTI.  Denies rectal bleeding or dark stool.   Of note, patient has history of aortitis, and is supposed to take antibiotics indefinitely, but states that has not taken his Augmentin for several months.  9/24: Case discussed with cardiology.  Troponins continue to uptrend.  Patient without chest pain.  I discussed the previous concern for vitreous hemorrhage with patient and son at bedside.  Etiology of event is unclear.  Cannot find documentation to support.  However patient states his episode was several months ago and he has not had any issues or recurrence of bleeding.   Assessment & Plan:   Principal Problem:   NSTEMI (non-ST elevated myocardial infarction) (HCC) Active Problems:   ESRD on hemodialysis (HCC)   Chronic diastolic CHF (congestive heart failure) (HCC)   Essential hypertension   Elevated lactic acid level   Anemia in ESRD (end-stage renal disease) (HCC)   Thrombocytopenia (HCC)   Aortitis (HCC)   Stroke (HCC)  NSTEMI (non-ST elevated myocardial infarction) (HCC): Troponins up trended to a max of 9400.  Not yet peaked.  No chest pain.  IV heparin was initially ordered  by EDP however held on admission due to concern for recent vitreous hemorrhage.  I discussed this with the patient.  This event is of unclear etiology and was several months ago.  At this time additional benefits of heparinization outweigh risks.  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  Has new oxygen requirement currently on 4 L.  Potassium elevated to 5.6 Plan: Nephrology following for HD   Chronic diastolic CHF (congestive heart failure) (HCC): 2D echo on 07/13/2022 showed EF of 55 to 60% with grade 2 diastolic dysfunction.  Patient has fluid overload now. Nephrology following for HD   Essential hypertension:  blood pressure soft.  Patient is not taking medications currently    Elevated lactic acid level:  Lactic acid 2.2.  No signs of infection.  No leukocytosis.  Likely due to hypoxia.    Anemia in ESRD (end-stage renal disease) (HCC):  Hemoglobin 10.7 (12.1 on 03/28/2023).  No active bleeding.    Thrombocytopenia (HCC):  This is chronic issue.  Platelet 85.  No active bleeding    Aortitis (HCC): Patient is supposed to take Augmentin definitely, but stopped taking this medication few months ago -Resumed Augmentin   Stroke (HCC) -Aspirin and Lipitor  History of vitreous hemorrhage This was initial concern and I discussed with patient.  Event

## 2023-04-10 NOTE — Progress Notes (Signed)
I had an extensive discussion with the family and patient regarding code status. They are aware of the critical nature of his illness. The patient's wishes are to be full code currently. The family does not wish to have an extensive resuscitation effort if they prospect of meaningful recovery is very low.

## 2023-04-10 NOTE — H&P (Addendum)
Cardiology Admission History and Physical   Patient ID: Jonathan Mcgrath MRN: [id number]; DOB: [birth year]   Admission date: 03/19/2023  PCP:  Estell Harpin, MD   [facility] Providers Cardiologist:  Yvonne Kendall, MD         Chief Complaint:  NSTEMI  Patient Profile:   Jonathan Mcgrath is a 82 y.o. 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  History of Present Illness:   [name] was previously evaluated in the emergency department 06/2018 with shortness of breath and elevated troponin.  Echocardiogram showed normal LV systolic function ejection fraction 55% with minimal valvular heart disease but significant diastolic dysfunction.  High-sensitivity troponin elevation of 0.66 and a BNP of 2154.  He had significant improvements after dialysis without any recurrent chest pain or other significant symptoms at that time.    He presented to [facility] via EMS from home with low blood pressure.  Patient states he had dialysis earlier and did not drink disallow 32 ounces of fluid drained.  Per EMS patient was orthostatic positive and was given 500 ml of fluid.  He was found to be hypoxic with oxygen saturations upper 80s and was placed on 2 L of O2 via nasal cannula.  He states he did not been feeling well for the past couple of days.  He went to dialysis on Wednesday and started to have complaints of chest weakness.  And then went to dialysis on Friday and was told that his blood pressure was low.  The ultrasound had an episode of nausea and vomiting with a mild cough.  No significant shortness of breath and no complaints of chest pain.   Initial vital signs: Blood pressure  101/60, pulse 55, temperature 98.6, and oxygen saturation 96% on 2 L of O2 via nasal cannula   Pertinent labs: Hemoglobin 10.7, hematocrit 33.4, blood glucose 119, BUN 35, serum creatinine 5.11, calcium 8.6, albumin 3.3, lactic acid 2.2, BNP 3735.5, high-sensitivity troponin 2621   Imaging: CT of the head without findings of acute intracranial hemorrhage; chest x-ray revealed cardiomegaly with mild pulmonary edema, Pericardial effusion is not excluded   Medications administered in the emergency department: Normal saline 500 mL IV, Benadryl 12.5 mg IV, Tylenol, aspirin 324 mg, heparin infusion ordered and then discontinued  He was evaluated in the emergency department. Echocardiogram was ordered which showed LVEF 20-25%, severely decreased function,moderate to severe pulmonary hypertension, moderate MR, moderate to severe TR, and severe calcification of the aortic valve. He was restarted on Heparin infusion after it was previously discontinued for concern of right eye vitreous hemorrhage three months prior. He was taken to the cardiac cath lab for [facility] with high sensitivity troponins peaking at 9478. 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 Severely elevated left and right filling pressures. Severe pulmonary hypertension. It was decided that he would then be transferred to [facility] for cardiac surgery evaluation.   Past Medical History:  Diagnosis Date   Cancer (HCC) 2019   colon   Chronic kidney disease    Diabetes mellitus without complication (HCC)    diet controlled   Hemodialysis access site with arteriovenous graft (HCC)    Hypertension    Infection due  Cardiology Admission History and Physical   Patient ID: Jonathan Mcgrath MRN: [id number]; DOB: [birth year]   Admission date: 03/19/2023  PCP:  Estell Harpin, MD   [facility] Providers Cardiologist:  Yvonne Kendall, MD         Chief Complaint:  NSTEMI  Patient Profile:   Jonathan Mcgrath is a 82 y.o. 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  History of Present Illness:   [name] was previously evaluated in the emergency department 06/2018 with shortness of breath and elevated troponin.  Echocardiogram showed normal LV systolic function ejection fraction 55% with minimal valvular heart disease but significant diastolic dysfunction.  High-sensitivity troponin elevation of 0.66 and a BNP of 2154.  He had significant improvements after dialysis without any recurrent chest pain or other significant symptoms at that time.    He presented to [facility] via EMS from home with low blood pressure.  Patient states he had dialysis earlier and did not drink disallow 32 ounces of fluid drained.  Per EMS patient was orthostatic positive and was given 500 ml of fluid.  He was found to be hypoxic with oxygen saturations upper 80s and was placed on 2 L of O2 via nasal cannula.  He states he did not been feeling well for the past couple of days.  He went to dialysis on Wednesday and started to have complaints of chest weakness.  And then went to dialysis on Friday and was told that his blood pressure was low.  The ultrasound had an episode of nausea and vomiting with a mild cough.  No significant shortness of breath and no complaints of chest pain.   Initial vital signs: Blood pressure  101/60, pulse 55, temperature 98.6, and oxygen saturation 96% on 2 L of O2 via nasal cannula   Pertinent labs: Hemoglobin 10.7, hematocrit 33.4, blood glucose 119, BUN 35, serum creatinine 5.11, calcium 8.6, albumin 3.3, lactic acid 2.2, BNP 3735.5, high-sensitivity troponin 2621   Imaging: CT of the head without findings of acute intracranial hemorrhage; chest x-ray revealed cardiomegaly with mild pulmonary edema, Pericardial effusion is not excluded   Medications administered in the emergency department: Normal saline 500 mL IV, Benadryl 12.5 mg IV, Tylenol, aspirin 324 mg, heparin infusion ordered and then discontinued  He was evaluated in the emergency department. Echocardiogram was ordered which showed LVEF 20-25%, severely decreased function,moderate to severe pulmonary hypertension, moderate MR, moderate to severe TR, and severe calcification of the aortic valve. He was restarted on Heparin infusion after it was previously discontinued for concern of right eye vitreous hemorrhage three months prior. He was taken to the cardiac cath lab for [facility] with high sensitivity troponins peaking at 9478. 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 Severely elevated left and right filling pressures. Severe pulmonary hypertension. It was decided that he would then be transferred to [facility] for cardiac surgery evaluation.   Past Medical History:  Diagnosis Date   Cancer (HCC) 2019   colon   Chronic kidney disease    Diabetes mellitus without complication (HCC)    diet controlled   Hemodialysis access site with arteriovenous graft (HCC)    Hypertension    Infection due  Cardiology Admission History and Physical   Patient ID: Jonathan Mcgrath MRN: [id number]; DOB: [birth year]   Admission date: 03/19/2023  PCP:  Estell Harpin, MD   [facility] Providers Cardiologist:  Yvonne Kendall, MD         Chief Complaint:  NSTEMI  Patient Profile:   Jonathan Mcgrath is a 82 y.o. 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  History of Present Illness:   [name] was previously evaluated in the emergency department 06/2018 with shortness of breath and elevated troponin.  Echocardiogram showed normal LV systolic function ejection fraction 55% with minimal valvular heart disease but significant diastolic dysfunction.  High-sensitivity troponin elevation of 0.66 and a BNP of 2154.  He had significant improvements after dialysis without any recurrent chest pain or other significant symptoms at that time.    He presented to [facility] via EMS from home with low blood pressure.  Patient states he had dialysis earlier and did not drink disallow 32 ounces of fluid drained.  Per EMS patient was orthostatic positive and was given 500 ml of fluid.  He was found to be hypoxic with oxygen saturations upper 80s and was placed on 2 L of O2 via nasal cannula.  He states he did not been feeling well for the past couple of days.  He went to dialysis on Wednesday and started to have complaints of chest weakness.  And then went to dialysis on Friday and was told that his blood pressure was low.  The ultrasound had an episode of nausea and vomiting with a mild cough.  No significant shortness of breath and no complaints of chest pain.   Initial vital signs: Blood pressure  101/60, pulse 55, temperature 98.6, and oxygen saturation 96% on 2 L of O2 via nasal cannula   Pertinent labs: Hemoglobin 10.7, hematocrit 33.4, blood glucose 119, BUN 35, serum creatinine 5.11, calcium 8.6, albumin 3.3, lactic acid 2.2, BNP 3735.5, high-sensitivity troponin 2621   Imaging: CT of the head without findings of acute intracranial hemorrhage; chest x-ray revealed cardiomegaly with mild pulmonary edema, Pericardial effusion is not excluded   Medications administered in the emergency department: Normal saline 500 mL IV, Benadryl 12.5 mg IV, Tylenol, aspirin 324 mg, heparin infusion ordered and then discontinued  He was evaluated in the emergency department. Echocardiogram was ordered which showed LVEF 20-25%, severely decreased function,moderate to severe pulmonary hypertension, moderate MR, moderate to severe TR, and severe calcification of the aortic valve. He was restarted on Heparin infusion after it was previously discontinued for concern of right eye vitreous hemorrhage three months prior. He was taken to the cardiac cath lab for [facility] with high sensitivity troponins peaking at 9478. 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 Severely elevated left and right filling pressures. Severe pulmonary hypertension. It was decided that he would then be transferred to [facility] for cardiac surgery evaluation.   Past Medical History:  Diagnosis Date   Cancer (HCC) 2019   colon   Chronic kidney disease    Diabetes mellitus without complication (HCC)    diet controlled   Hemodialysis access site with arteriovenous graft (HCC)    Hypertension    Infection due  Cardiology Admission History and Physical   Patient ID: Jonathan Mcgrath MRN: [id number]; DOB: [birth year]   Admission date: 03/19/2023  PCP:  Estell Harpin, MD   [facility] Providers Cardiologist:  Yvonne Kendall, MD         Chief Complaint:  NSTEMI  Patient Profile:   Jonathan Mcgrath is a 82 y.o. 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  History of Present Illness:   [name] was previously evaluated in the emergency department 06/2018 with shortness of breath and elevated troponin.  Echocardiogram showed normal LV systolic function ejection fraction 55% with minimal valvular heart disease but significant diastolic dysfunction.  High-sensitivity troponin elevation of 0.66 and a BNP of 2154.  He had significant improvements after dialysis without any recurrent chest pain or other significant symptoms at that time.    He presented to [facility] via EMS from home with low blood pressure.  Patient states he had dialysis earlier and did not drink disallow 32 ounces of fluid drained.  Per EMS patient was orthostatic positive and was given 500 ml of fluid.  He was found to be hypoxic with oxygen saturations upper 80s and was placed on 2 L of O2 via nasal cannula.  He states he did not been feeling well for the past couple of days.  He went to dialysis on Wednesday and started to have complaints of chest weakness.  And then went to dialysis on Friday and was told that his blood pressure was low.  The ultrasound had an episode of nausea and vomiting with a mild cough.  No significant shortness of breath and no complaints of chest pain.   Initial vital signs: Blood pressure  101/60, pulse 55, temperature 98.6, and oxygen saturation 96% on 2 L of O2 via nasal cannula   Pertinent labs: Hemoglobin 10.7, hematocrit 33.4, blood glucose 119, BUN 35, serum creatinine 5.11, calcium 8.6, albumin 3.3, lactic acid 2.2, BNP 3735.5, high-sensitivity troponin 2621   Imaging: CT of the head without findings of acute intracranial hemorrhage; chest x-ray revealed cardiomegaly with mild pulmonary edema, Pericardial effusion is not excluded   Medications administered in the emergency department: Normal saline 500 mL IV, Benadryl 12.5 mg IV, Tylenol, aspirin 324 mg, heparin infusion ordered and then discontinued  He was evaluated in the emergency department. Echocardiogram was ordered which showed LVEF 20-25%, severely decreased function,moderate to severe pulmonary hypertension, moderate MR, moderate to severe TR, and severe calcification of the aortic valve. He was restarted on Heparin infusion after it was previously discontinued for concern of right eye vitreous hemorrhage three months prior. He was taken to the cardiac cath lab for [facility] with high sensitivity troponins peaking at 9478. 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 Severely elevated left and right filling pressures. Severe pulmonary hypertension. It was decided that he would then be transferred to [facility] for cardiac surgery evaluation.   Past Medical History:  Diagnosis Date   Cancer (HCC) 2019   colon   Chronic kidney disease    Diabetes mellitus without complication (HCC)    diet controlled   Hemodialysis access site with arteriovenous graft (HCC)    Hypertension    Infection due  Cardiology Admission History and Physical   Patient ID: Jonathan Mcgrath MRN: [id number]; DOB: [birth year]   Admission date: 04/15/2023  PCP:  Estell Harpin, MD   [facility] Providers Cardiologist:  Yvonne Kendall, MD         Chief Complaint:  NSTEMI  Patient Profile:   Jonathan Mcgrath is a 82 y.o. 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  History of Present Illness:   [name] was previously evaluated in the emergency department 06/2018 with shortness of breath and elevated troponin.  Echocardiogram showed normal LV systolic function ejection fraction 55% with minimal valvular heart disease but significant diastolic dysfunction.  High-sensitivity troponin elevation of 0.66 and a BNP of 2154.  He had significant improvements after dialysis without any recurrent chest pain or other significant symptoms at that time.    He presented to [facility] via EMS from home with low blood pressure.  Patient states he had dialysis earlier and did not drink disallow 32 ounces of fluid drained.  Per EMS patient was orthostatic positive and was given 500 ml of fluid.  He was found to be hypoxic with oxygen saturations upper 80s and was placed on 2 L of O2 via nasal cannula.  He states he did not been feeling well for the past couple of days.  He went to dialysis on Wednesday and started to have complaints of chest weakness.  And then went to dialysis on Friday and was told that his blood pressure was low.  The ultrasound had an episode of nausea and vomiting with a mild cough.  No significant shortness of breath and no complaints of chest pain.   Initial vital signs: Blood pressure  101/60, pulse 55, temperature 98.6, and oxygen saturation 96% on 2 L of O2 via nasal cannula   Pertinent labs: Hemoglobin 10.7, hematocrit 33.4, blood glucose 119, BUN 35, serum creatinine 5.11, calcium 8.6, albumin 3.3, lactic acid 2.2, BNP 3735.5, high-sensitivity troponin 2621   Imaging: CT of the head without findings of acute intracranial hemorrhage; chest x-ray revealed cardiomegaly with mild pulmonary edema, Pericardial effusion is not excluded   Medications administered in the emergency department: Normal saline 500 mL IV, Benadryl 12.5 mg IV, Tylenol, aspirin 324 mg, heparin infusion ordered and then discontinued  He was evaluated in the emergency department. Echocardiogram was ordered which showed LVEF 20-25%, severely decreased function,moderate to severe pulmonary hypertension, moderate MR, moderate to severe TR, and severe calcification of the aortic valve. He was restarted on Heparin infusion after it was previously discontinued for concern of right eye vitreous hemorrhage three months prior. He was taken to the cardiac cath lab for [facility] with high sensitivity troponins peaking at 9478. 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 Severely elevated left and right filling pressures. Severe pulmonary hypertension. It was decided that he would then be transferred to [facility] for cardiac surgery evaluation.   Past Medical History:  Diagnosis Date   Cancer (HCC) 2019   colon   Chronic kidney disease    Diabetes mellitus without complication (HCC)    diet controlled   Hemodialysis access site with arteriovenous graft (HCC)    Hypertension    Infection due  Cardiology Admission History and Physical   Patient ID: Jonathan Mcgrath MRN: [id number]; DOB: [birth year]   Admission date: 04/15/2023  PCP:  Estell Harpin, MD   [facility] Providers Cardiologist:  Yvonne Kendall, MD         Chief Complaint:  NSTEMI  Patient Profile:   Jonathan Mcgrath is a 82 y.o. 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  History of Present Illness:   [name] was previously evaluated in the emergency department 06/2018 with shortness of breath and elevated troponin.  Echocardiogram showed normal LV systolic function ejection fraction 55% with minimal valvular heart disease but significant diastolic dysfunction.  High-sensitivity troponin elevation of 0.66 and a BNP of 2154.  He had significant improvements after dialysis without any recurrent chest pain or other significant symptoms at that time.    He presented to [facility] via EMS from home with low blood pressure.  Patient states he had dialysis earlier and did not drink disallow 32 ounces of fluid drained.  Per EMS patient was orthostatic positive and was given 500 ml of fluid.  He was found to be hypoxic with oxygen saturations upper 80s and was placed on 2 L of O2 via nasal cannula.  He states he did not been feeling well for the past couple of days.  He went to dialysis on Wednesday and started to have complaints of chest weakness.  And then went to dialysis on Friday and was told that his blood pressure was low.  The ultrasound had an episode of nausea and vomiting with a mild cough.  No significant shortness of breath and no complaints of chest pain.   Initial vital signs: Blood pressure  101/60, pulse 55, temperature 98.6, and oxygen saturation 96% on 2 L of O2 via nasal cannula   Pertinent labs: Hemoglobin 10.7, hematocrit 33.4, blood glucose 119, BUN 35, serum creatinine 5.11, calcium 8.6, albumin 3.3, lactic acid 2.2, BNP 3735.5, high-sensitivity troponin 2621   Imaging: CT of the head without findings of acute intracranial hemorrhage; chest x-ray revealed cardiomegaly with mild pulmonary edema, Pericardial effusion is not excluded   Medications administered in the emergency department: Normal saline 500 mL IV, Benadryl 12.5 mg IV, Tylenol, aspirin 324 mg, heparin infusion ordered and then discontinued  He was evaluated in the emergency department. Echocardiogram was ordered which showed LVEF 20-25%, severely decreased function,moderate to severe pulmonary hypertension, moderate MR, moderate to severe TR, and severe calcification of the aortic valve. He was restarted on Heparin infusion after it was previously discontinued for concern of right eye vitreous hemorrhage three months prior. He was taken to the cardiac cath lab for [facility] with high sensitivity troponins peaking at 9478. 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 Severely elevated left and right filling pressures. Severe pulmonary hypertension. It was decided that he would then be transferred to [facility] for cardiac surgery evaluation.   Past Medical History:  Diagnosis Date   Cancer (HCC) 2019   colon   Chronic kidney disease    Diabetes mellitus without complication (HCC)    diet controlled   Hemodialysis access site with arteriovenous graft (HCC)    Hypertension    Infection due  Cardiology Admission History and Physical   Patient ID: Jonathan Mcgrath MRN: [id number]; DOB: [birth year]   Admission date: 03/19/2023  PCP:  Estell Harpin, MD   [facility] Providers Cardiologist:  Yvonne Kendall, MD         Chief Complaint:  NSTEMI  Patient Profile:   Jonathan Mcgrath is a 82 y.o. 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  History of Present Illness:   [name] was previously evaluated in the emergency department 06/2018 with shortness of breath and elevated troponin.  Echocardiogram showed normal LV systolic function ejection fraction 55% with minimal valvular heart disease but significant diastolic dysfunction.  High-sensitivity troponin elevation of 0.66 and a BNP of 2154.  He had significant improvements after dialysis without any recurrent chest pain or other significant symptoms at that time.    He presented to [facility] via EMS from home with low blood pressure.  Patient states he had dialysis earlier and did not drink disallow 32 ounces of fluid drained.  Per EMS patient was orthostatic positive and was given 500 ml of fluid.  He was found to be hypoxic with oxygen saturations upper 80s and was placed on 2 L of O2 via nasal cannula.  He states he did not been feeling well for the past couple of days.  He went to dialysis on Wednesday and started to have complaints of chest weakness.  And then went to dialysis on Friday and was told that his blood pressure was low.  The ultrasound had an episode of nausea and vomiting with a mild cough.  No significant shortness of breath and no complaints of chest pain.   Initial vital signs: Blood pressure  101/60, pulse 55, temperature 98.6, and oxygen saturation 96% on 2 L of O2 via nasal cannula   Pertinent labs: Hemoglobin 10.7, hematocrit 33.4, blood glucose 119, BUN 35, serum creatinine 5.11, calcium 8.6, albumin 3.3, lactic acid 2.2, BNP 3735.5, high-sensitivity troponin 2621   Imaging: CT of the head without findings of acute intracranial hemorrhage; chest x-ray revealed cardiomegaly with mild pulmonary edema, Pericardial effusion is not excluded   Medications administered in the emergency department: Normal saline 500 mL IV, Benadryl 12.5 mg IV, Tylenol, aspirin 324 mg, heparin infusion ordered and then discontinued  He was evaluated in the emergency department. Echocardiogram was ordered which showed LVEF 20-25%, severely decreased function,moderate to severe pulmonary hypertension, moderate MR, moderate to severe TR, and severe calcification of the aortic valve. He was restarted on Heparin infusion after it was previously discontinued for concern of right eye vitreous hemorrhage three months prior. He was taken to the cardiac cath lab for [facility] with high sensitivity troponins peaking at 9478. 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 Severely elevated left and right filling pressures. Severe pulmonary hypertension. It was decided that he would then be transferred to [facility] for cardiac surgery evaluation.   Past Medical History:  Diagnosis Date   Cancer (HCC) 2019   colon   Chronic kidney disease    Diabetes mellitus without complication (HCC)    diet controlled   Hemodialysis access site with arteriovenous graft (HCC)    Hypertension    Infection due  Cardiology Admission History and Physical   Patient ID: Jonathan Mcgrath MRN: [id number]; DOB: [birth year]   Admission date: 04/07/2023  PCP:  Estell Harpin, MD   [facility] Providers Cardiologist:  Yvonne Kendall, MD         Chief Complaint:  NSTEMI  Patient Profile:   Jonathan Mcgrath is a 82 y.o. 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  History of Present Illness:   [name] was previously evaluated in the emergency department 06/2018 with shortness of breath and elevated troponin.  Echocardiogram showed normal LV systolic function ejection fraction 55% with minimal valvular heart disease but significant diastolic dysfunction.  High-sensitivity troponin elevation of 0.66 and a BNP of 2154.  He had significant improvements after dialysis without any recurrent chest pain or other significant symptoms at that time.    He presented to [facility] via EMS from home with low blood pressure.  Patient states he had dialysis earlier and did not drink disallow 32 ounces of fluid drained.  Per EMS patient was orthostatic positive and was given 500 ml of fluid.  He was found to be hypoxic with oxygen saturations upper 80s and was placed on 2 L of O2 via nasal cannula.  He states he did not been feeling well for the past couple of days.  He went to dialysis on Wednesday and started to have complaints of chest weakness.  And then went to dialysis on Friday and was told that his blood pressure was low.  The ultrasound had an episode of nausea and vomiting with a mild cough.  No significant shortness of breath and no complaints of chest pain.   Initial vital signs: Blood pressure  101/60, pulse 55, temperature 98.6, and oxygen saturation 96% on 2 L of O2 via nasal cannula   Pertinent labs: Hemoglobin 10.7, hematocrit 33.4, blood glucose 119, BUN 35, serum creatinine 5.11, calcium 8.6, albumin 3.3, lactic acid 2.2, BNP 3735.5, high-sensitivity troponin 2621   Imaging: CT of the head without findings of acute intracranial hemorrhage; chest x-ray revealed cardiomegaly with mild pulmonary edema, Pericardial effusion is not excluded   Medications administered in the emergency department: Normal saline 500 mL IV, Benadryl 12.5 mg IV, Tylenol, aspirin 324 mg, heparin infusion ordered and then discontinued  He was evaluated in the emergency department. Echocardiogram was ordered which showed LVEF 20-25%, severely decreased function,moderate to severe pulmonary hypertension, moderate MR, moderate to severe TR, and severe calcification of the aortic valve. He was restarted on Heparin infusion after it was previously discontinued for concern of right eye vitreous hemorrhage three months prior. He was taken to the cardiac cath lab for [facility] with high sensitivity troponins peaking at 9478. 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 Severely elevated left and right filling pressures. Severe pulmonary hypertension. It was decided that he would then be transferred to [facility] for cardiac surgery evaluation.   Past Medical History:  Diagnosis Date   Cancer (HCC) 2019   colon   Chronic kidney disease    Diabetes mellitus without complication (HCC)    diet controlled   Hemodialysis access site with arteriovenous graft (HCC)    Hypertension    Infection due  Cardiology Admission History and Physical   Patient ID: Jonathan Mcgrath MRN: [id number]; DOB: [birth year]   Admission date: 04/15/2023  PCP:  Estell Harpin, MD   [facility] Providers Cardiologist:  Yvonne Kendall, MD         Chief Complaint:  NSTEMI  Patient Profile:   Jonathan Mcgrath is a 82 y.o. 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  History of Present Illness:   [name] was previously evaluated in the emergency department 06/2018 with shortness of breath and elevated troponin.  Echocardiogram showed normal LV systolic function ejection fraction 55% with minimal valvular heart disease but significant diastolic dysfunction.  High-sensitivity troponin elevation of 0.66 and a BNP of 2154.  He had significant improvements after dialysis without any recurrent chest pain or other significant symptoms at that time.    He presented to [facility] via EMS from home with low blood pressure.  Patient states he had dialysis earlier and did not drink disallow 32 ounces of fluid drained.  Per EMS patient was orthostatic positive and was given 500 ml of fluid.  He was found to be hypoxic with oxygen saturations upper 80s and was placed on 2 L of O2 via nasal cannula.  He states he did not been feeling well for the past couple of days.  He went to dialysis on Wednesday and started to have complaints of chest weakness.  And then went to dialysis on Friday and was told that his blood pressure was low.  The ultrasound had an episode of nausea and vomiting with a mild cough.  No significant shortness of breath and no complaints of chest pain.   Initial vital signs: Blood pressure  101/60, pulse 55, temperature 98.6, and oxygen saturation 96% on 2 L of O2 via nasal cannula   Pertinent labs: Hemoglobin 10.7, hematocrit 33.4, blood glucose 119, BUN 35, serum creatinine 5.11, calcium 8.6, albumin 3.3, lactic acid 2.2, BNP 3735.5, high-sensitivity troponin 2621   Imaging: CT of the head without findings of acute intracranial hemorrhage; chest x-ray revealed cardiomegaly with mild pulmonary edema, Pericardial effusion is not excluded   Medications administered in the emergency department: Normal saline 500 mL IV, Benadryl 12.5 mg IV, Tylenol, aspirin 324 mg, heparin infusion ordered and then discontinued  He was evaluated in the emergency department. Echocardiogram was ordered which showed LVEF 20-25%, severely decreased function,moderate to severe pulmonary hypertension, moderate MR, moderate to severe TR, and severe calcification of the aortic valve. He was restarted on Heparin infusion after it was previously discontinued for concern of right eye vitreous hemorrhage three months prior. He was taken to the cardiac cath lab for [facility] with high sensitivity troponins peaking at 9478. 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 Severely elevated left and right filling pressures. Severe pulmonary hypertension. It was decided that he would then be transferred to [facility] for cardiac surgery evaluation.   Past Medical History:  Diagnosis Date   Cancer (HCC) 2019   colon   Chronic kidney disease    Diabetes mellitus without complication (HCC)    diet controlled   Hemodialysis access site with arteriovenous graft (HCC)    Hypertension    Infection due  Cardiology Admission History and Physical   Patient ID: Jonathan Mcgrath MRN: [id number]; DOB: [birth year]   Admission date: 04/15/2023  PCP:  Estell Harpin, MD   [facility] Providers Cardiologist:  Yvonne Kendall, MD         Chief Complaint:  NSTEMI  Patient Profile:   Jonathan Mcgrath is a 82 y.o. 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  History of Present Illness:   [name] was previously evaluated in the emergency department 06/2018 with shortness of breath and elevated troponin.  Echocardiogram showed normal LV systolic function ejection fraction 55% with minimal valvular heart disease but significant diastolic dysfunction.  High-sensitivity troponin elevation of 0.66 and a BNP of 2154.  He had significant improvements after dialysis without any recurrent chest pain or other significant symptoms at that time.    He presented to [facility] via EMS from home with low blood pressure.  Patient states he had dialysis earlier and did not drink disallow 32 ounces of fluid drained.  Per EMS patient was orthostatic positive and was given 500 ml of fluid.  He was found to be hypoxic with oxygen saturations upper 80s and was placed on 2 L of O2 via nasal cannula.  He states he did not been feeling well for the past couple of days.  He went to dialysis on Wednesday and started to have complaints of chest weakness.  And then went to dialysis on Friday and was told that his blood pressure was low.  The ultrasound had an episode of nausea and vomiting with a mild cough.  No significant shortness of breath and no complaints of chest pain.   Initial vital signs: Blood pressure  101/60, pulse 55, temperature 98.6, and oxygen saturation 96% on 2 L of O2 via nasal cannula   Pertinent labs: Hemoglobin 10.7, hematocrit 33.4, blood glucose 119, BUN 35, serum creatinine 5.11, calcium 8.6, albumin 3.3, lactic acid 2.2, BNP 3735.5, high-sensitivity troponin 2621   Imaging: CT of the head without findings of acute intracranial hemorrhage; chest x-ray revealed cardiomegaly with mild pulmonary edema, Pericardial effusion is not excluded   Medications administered in the emergency department: Normal saline 500 mL IV, Benadryl 12.5 mg IV, Tylenol, aspirin 324 mg, heparin infusion ordered and then discontinued  He was evaluated in the emergency department. Echocardiogram was ordered which showed LVEF 20-25%, severely decreased function,moderate to severe pulmonary hypertension, moderate MR, moderate to severe TR, and severe calcification of the aortic valve. He was restarted on Heparin infusion after it was previously discontinued for concern of right eye vitreous hemorrhage three months prior. He was taken to the cardiac cath lab for [facility] with high sensitivity troponins peaking at 9478. 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 Severely elevated left and right filling pressures. Severe pulmonary hypertension. It was decided that he would then be transferred to [facility] for cardiac surgery evaluation.   Past Medical History:  Diagnosis Date   Cancer (HCC) 2019   colon   Chronic kidney disease    Diabetes mellitus without complication (HCC)    diet controlled   Hemodialysis access site with arteriovenous graft (HCC)    Hypertension    Infection due

## 2023-04-10 NOTE — ED Notes (Signed)
Procedure just called and stated that they were going to come and get the pt for a cardiac cath now.  Per nurse, it was stated that the heparin should stop.  Heparin stopped  Pt made aware of the above

## 2023-04-10 NOTE — Progress Notes (Signed)
*  PRELIMINARY RESULTS* Echocardiogram 2D Echocardiogram has been performed.  Jonathan Mcgrath 2023-04-19, 12:09 PM

## 2023-04-10 NOTE — Consult Note (Addendum)
Cardiology Consultation   Patient ID: [name] MRN: [id number]; DOB: 05-30-1941  Admit date: 04/09/2023 Date of Consult: 03/30/2023  PCP:  Estell Harpin, MD   [facility] Providers Cardiologist:  None      Consult completed by Dr End  Patient Profile:   Jonathan Mcgrath is a 82 y.o. 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 Clyde Lundborg.  History of Present Illness:   [name] patient was previously evaluated in the emergency department 06/2018 with shortness of breath and elevated troponin.  Echocardiogram showed normal LV systolic function ejection fraction 55% with minimal valvular heart disease but significant diastolic dysfunction.  High-sensitivity troponin elevation of 0.66 and a BNP of 2154.  He had significant improvements after dialysis without any recurrent chest pain or other significant symptoms at that time.   He presented to [facility] via EMS from home with low blood pressure.  Patient states he had dialysis earlier and did not drink disallow 32 ounces of fluid drained.  Per EMS patient was orthostatic positive and was given 500 ml of fluid.  He was found to be hypoxic with oxygen saturations upper 80s and was placed on 2 L of O2 via nasal cannula.  He states he did not been feeling well for the past couple of days.  He went to dialysis on Wednesday and started to have complaints of chest weakness.  And then went to dialysis on Friday and was told that his blood pressure was low.  The ultrasound had an episode of nausea and vomiting with a mild cough.  No significant shortness of breath and no complaints of chest pain.  Initial vital signs: Blood pressure  101/60, pulse 55, temperature 98.6, and oxygen saturation 96% on 2 L of O2 via nasal cannula  Pertinent labs: Hemoglobin 10.7, hematocrit 33.4, blood glucose 119, BUN 35, serum creatinine 5.11, calcium 8.6, albumin 3.3, lactic acid 2.2, BNP 3735.5, high-sensitivity troponin 2621  Imaging: CT of the head without findings of acute intracranial hemorrhage; chest x-ray revealed cardiomegaly with mild pulmonary edema, Pericardial effusion is not excluded  Medications administered in the emergency department: Normal saline 500 mL IV, Benadryl 12.5 mg IV, Tylenol, aspirin 324 mg, heparin infusion ordered and then discontinued  Cardiology was consulted for concerns of NSTEMI  Past Medical History:  Diagnosis Date   Cancer (HCC) 2019   colon   Chronic kidney disease    Diabetes mellitus without complication (HCC)    diet controlled   Hemodialysis access site with arteriovenous graft (HCC)    Hypertension    Infection due to Clostridium septicum (HCC) 08/27/2018   Renal insufficiency    Pt on dialysis x 3 years and receives every Monday, Wednesday and Friday.     Past Surgical History:  Procedure Laterality Date   AV FISTULA PLACEMENT Left 2015   arm   CATARACT EXTRACTION W/ INTRAOCULAR LENS  IMPLANT, BILATERAL Bilateral 08/2017   CENTRAL LINE INSERTION-TUNNELED N/A 04/19/2018   Procedure: CENTRAL LINE INSERTION-TUNNELED;  Surgeon: Renford Dills, MD;  Location: [facility] INVASIVE CV LAB;  Service: Cardiovascular;  Laterality: N/A;   CENTRAL VENOUS CATHETER INSERTION  07/15/2018   Procedure: Insertion Hickman Catheter;  Surgeon: Annice Needy, MD;  Location: [facility] INVAS[facility] CV [facility];  Service:  Cardiology Consultation   Patient ID: [name] MRN: [id number]; DOB: 05-30-1941  Admit date: 04/09/2023 Date of Consult: 04/09/2023  PCP:  Estell Harpin, MD   [facility] Providers Cardiologist:  None      Consult completed by Dr End  Patient Profile:   Jonathan Mcgrath is a 82 y.o. 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 Clyde Lundborg.  History of Present Illness:   [name] patient was previously evaluated in the emergency department 06/2018 with shortness of breath and elevated troponin.  Echocardiogram showed normal LV systolic function ejection fraction 55% with minimal valvular heart disease but significant diastolic dysfunction.  High-sensitivity troponin elevation of 0.66 and a BNP of 2154.  He had significant improvements after dialysis without any recurrent chest pain or other significant symptoms at that time.   He presented to [facility] via EMS from home with low blood pressure.  Patient states he had dialysis earlier and did not drink disallow 32 ounces of fluid drained.  Per EMS patient was orthostatic positive and was given 500 ml of fluid.  He was found to be hypoxic with oxygen saturations upper 80s and was placed on 2 L of O2 via nasal cannula.  He states he did not been feeling well for the past couple of days.  He went to dialysis on Wednesday and started to have complaints of chest weakness.  And then went to dialysis on Friday and was told that his blood pressure was low.  The ultrasound had an episode of nausea and vomiting with a mild cough.  No significant shortness of breath and no complaints of chest pain.  Initial vital signs: Blood pressure  101/60, pulse 55, temperature 98.6, and oxygen saturation 96% on 2 L of O2 via nasal cannula  Pertinent labs: Hemoglobin 10.7, hematocrit 33.4, blood glucose 119, BUN 35, serum creatinine 5.11, calcium 8.6, albumin 3.3, lactic acid 2.2, BNP 3735.5, high-sensitivity troponin 2621  Imaging: CT of the head without findings of acute intracranial hemorrhage; chest x-ray revealed cardiomegaly with mild pulmonary edema, Pericardial effusion is not excluded  Medications administered in the emergency department: Normal saline 500 mL IV, Benadryl 12.5 mg IV, Tylenol, aspirin 324 mg, heparin infusion ordered and then discontinued  Cardiology was consulted for concerns of NSTEMI  Past Medical History:  Diagnosis Date   Cancer (HCC) 2019   colon   Chronic kidney disease    Diabetes mellitus without complication (HCC)    diet controlled   Hemodialysis access site with arteriovenous graft (HCC)    Hypertension    Infection due to Clostridium septicum (HCC) 08/27/2018   Renal insufficiency    Pt on dialysis x 3 years and receives every Monday, Wednesday and Friday.     Past Surgical History:  Procedure Laterality Date   AV FISTULA PLACEMENT Left 2015   arm   CATARACT EXTRACTION W/ INTRAOCULAR LENS  IMPLANT, BILATERAL Bilateral 08/2017   CENTRAL LINE INSERTION-TUNNELED N/A 04/19/2018   Procedure: CENTRAL LINE INSERTION-TUNNELED;  Surgeon: Renford Dills, MD;  Location: [facility];  Service: Cardiovascular;  Laterality: N/A;   CENTRAL VENOUS CATHETER INSERTION  07/15/2018   Procedure: Insertion Hickman Catheter;  Surgeon: Annice Needy, MD;  Location: [facility];  Service:  Cardiology Consultation   Patient ID: [name] MRN: [id number]; DOB: 05-30-1941  Admit date: 04/09/2023 Date of Consult: 03/25/2023  PCP:  Estell Harpin, MD   [facility] Providers Cardiologist:  None      Consult completed by Dr End  Patient Profile:   Jonathan Mcgrath is a 82 y.o. 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 Clyde Lundborg.  History of Present Illness:   [name] patient was previously evaluated in the emergency department 06/2018 with shortness of breath and elevated troponin.  Echocardiogram showed normal LV systolic function ejection fraction 55% with minimal valvular heart disease but significant diastolic dysfunction.  High-sensitivity troponin elevation of 0.66 and a BNP of 2154.  He had significant improvements after dialysis without any recurrent chest pain or other significant symptoms at that time.   He presented to [facility] via EMS from home with low blood pressure.  Patient states he had dialysis earlier and did not drink disallow 32 ounces of fluid drained.  Per EMS patient was orthostatic positive and was given 500 ml of fluid.  He was found to be hypoxic with oxygen saturations upper 80s and was placed on 2 L of O2 via nasal cannula.  He states he did not been feeling well for the past couple of days.  He went to dialysis on Wednesday and started to have complaints of chest weakness.  And then went to dialysis on Friday and was told that his blood pressure was low.  The ultrasound had an episode of nausea and vomiting with a mild cough.  No significant shortness of breath and no complaints of chest pain.  Initial vital signs: Blood pressure  101/60, pulse 55, temperature 98.6, and oxygen saturation 96% on 2 L of O2 via nasal cannula  Pertinent labs: Hemoglobin 10.7, hematocrit 33.4, blood glucose 119, BUN 35, serum creatinine 5.11, calcium 8.6, albumin 3.3, lactic acid 2.2, BNP 3735.5, high-sensitivity troponin 2621  Imaging: CT of the head without findings of acute intracranial hemorrhage; chest x-ray revealed cardiomegaly with mild pulmonary edema, Pericardial effusion is not excluded  Medications administered in the emergency department: Normal saline 500 mL IV, Benadryl 12.5 mg IV, Tylenol, aspirin 324 mg, heparin infusion ordered and then discontinued  Cardiology was consulted for concerns of NSTEMI  Past Medical History:  Diagnosis Date   Cancer (HCC) 2019   colon   Chronic kidney disease    Diabetes mellitus without complication (HCC)    diet controlled   Hemodialysis access site with arteriovenous graft (HCC)    Hypertension    Infection due to Clostridium septicum (HCC) 08/27/2018   Renal insufficiency    Pt on dialysis x 3 years and receives every Monday, Wednesday and Friday.     Past Surgical History:  Procedure Laterality Date   AV FISTULA PLACEMENT Left 2015   arm   CATARACT EXTRACTION W/ INTRAOCULAR LENS  IMPLANT, BILATERAL Bilateral 08/2017   CENTRAL LINE INSERTION-TUNNELED N/A 04/19/2018   Procedure: CENTRAL LINE INSERTION-TUNNELED;  Surgeon: Renford Dills, MD;  Location: [facility];  Service: Cardiovascular;  Laterality: N/A;   CENTRAL VENOUS CATHETER INSERTION  07/15/2018   Procedure: Insertion Hickman Catheter;  Surgeon: Annice Needy, MD;  Location: [facility];  Service:  Cardiology Consultation   Patient ID: [name] MRN: [id number]; DOB: 05-30-1941  Admit date: 04/09/2023 Date of Consult: 04/09/2023  PCP:  Estell Harpin, MD   [facility] Providers Cardiologist:  None      Consult completed by Dr End  Patient Profile:   Jonathan Mcgrath is a 82 y.o. 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 Clyde Lundborg.  History of Present Illness:   [name] patient was previously evaluated in the emergency department 06/2018 with shortness of breath and elevated troponin.  Echocardiogram showed normal LV systolic function ejection fraction 55% with minimal valvular heart disease but significant diastolic dysfunction.  High-sensitivity troponin elevation of 0.66 and a BNP of 2154.  He had significant improvements after dialysis without any recurrent chest pain or other significant symptoms at that time.   He presented to [facility] via EMS from home with low blood pressure.  Patient states he had dialysis earlier and did not drink disallow 32 ounces of fluid drained.  Per EMS patient was orthostatic positive and was given 500 ml of fluid.  He was found to be hypoxic with oxygen saturations upper 80s and was placed on 2 L of O2 via nasal cannula.  He states he did not been feeling well for the past couple of days.  He went to dialysis on Wednesday and started to have complaints of chest weakness.  And then went to dialysis on Friday and was told that his blood pressure was low.  The ultrasound had an episode of nausea and vomiting with a mild cough.  No significant shortness of breath and no complaints of chest pain.  Initial vital signs: Blood pressure  101/60, pulse 55, temperature 98.6, and oxygen saturation 96% on 2 L of O2 via nasal cannula  Pertinent labs: Hemoglobin 10.7, hematocrit 33.4, blood glucose 119, BUN 35, serum creatinine 5.11, calcium 8.6, albumin 3.3, lactic acid 2.2, BNP 3735.5, high-sensitivity troponin 2621  Imaging: CT of the head without findings of acute intracranial hemorrhage; chest x-ray revealed cardiomegaly with mild pulmonary edema, Pericardial effusion is not excluded  Medications administered in the emergency department: Normal saline 500 mL IV, Benadryl 12.5 mg IV, Tylenol, aspirin 324 mg, heparin infusion ordered and then discontinued  Cardiology was consulted for concerns of NSTEMI  Past Medical History:  Diagnosis Date   Cancer (HCC) 2019   colon   Chronic kidney disease    Diabetes mellitus without complication (HCC)    diet controlled   Hemodialysis access site with arteriovenous graft (HCC)    Hypertension    Infection due to Clostridium septicum (HCC) 08/27/2018   Renal insufficiency    Pt on dialysis x 3 years and receives every Monday, Wednesday and Friday.     Past Surgical History:  Procedure Laterality Date   AV FISTULA PLACEMENT Left 2015   arm   CATARACT EXTRACTION W/ INTRAOCULAR LENS  IMPLANT, BILATERAL Bilateral 08/2017   CENTRAL LINE INSERTION-TUNNELED N/A 04/19/2018   Procedure: CENTRAL LINE INSERTION-TUNNELED;  Surgeon: Renford Dills, MD;  Location: [facility];  Service: Cardiovascular;  Laterality: N/A;   CENTRAL VENOUS CATHETER INSERTION  07/15/2018   Procedure: Insertion Hickman Catheter;  Surgeon: Annice Needy, MD;  Location: [facility];  Service:  Cardiology Consultation   Patient ID: [name] MRN: [id number]; DOB: 05-30-1941  Admit date: 04/09/2023 Date of Consult: 03/30/2023  PCP:  Estell Harpin, MD   [facility] Providers Cardiologist:  None      Consult completed by Dr End  Patient Profile:   Jonathan Mcgrath is a 82 y.o. 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 Clyde Lundborg.  History of Present Illness:   [name] patient was previously evaluated in the emergency department 06/2018 with shortness of breath and elevated troponin.  Echocardiogram showed normal LV systolic function ejection fraction 55% with minimal valvular heart disease but significant diastolic dysfunction.  High-sensitivity troponin elevation of 0.66 and a BNP of 2154.  He had significant improvements after dialysis without any recurrent chest pain or other significant symptoms at that time.   He presented to [facility] via EMS from home with low blood pressure.  Patient states he had dialysis earlier and did not drink disallow 32 ounces of fluid drained.  Per EMS patient was orthostatic positive and was given 500 ml of fluid.  He was found to be hypoxic with oxygen saturations upper 80s and was placed on 2 L of O2 via nasal cannula.  He states he did not been feeling well for the past couple of days.  He went to dialysis on Wednesday and started to have complaints of chest weakness.  And then went to dialysis on Friday and was told that his blood pressure was low.  The ultrasound had an episode of nausea and vomiting with a mild cough.  No significant shortness of breath and no complaints of chest pain.  Initial vital signs: Blood pressure  101/60, pulse 55, temperature 98.6, and oxygen saturation 96% on 2 L of O2 via nasal cannula  Pertinent labs: Hemoglobin 10.7, hematocrit 33.4, blood glucose 119, BUN 35, serum creatinine 5.11, calcium 8.6, albumin 3.3, lactic acid 2.2, BNP 3735.5, high-sensitivity troponin 2621  Imaging: CT of the head without findings of acute intracranial hemorrhage; chest x-ray revealed cardiomegaly with mild pulmonary edema, Pericardial effusion is not excluded  Medications administered in the emergency department: Normal saline 500 mL IV, Benadryl 12.5 mg IV, Tylenol, aspirin 324 mg, heparin infusion ordered and then discontinued  Cardiology was consulted for concerns of NSTEMI  Past Medical History:  Diagnosis Date   Cancer (HCC) 2019   colon   Chronic kidney disease    Diabetes mellitus without complication (HCC)    diet controlled   Hemodialysis access site with arteriovenous graft (HCC)    Hypertension    Infection due to Clostridium septicum (HCC) 08/27/2018   Renal insufficiency    Pt on dialysis x 3 years and receives every Monday, Wednesday and Friday.     Past Surgical History:  Procedure Laterality Date   AV FISTULA PLACEMENT Left 2015   arm   CATARACT EXTRACTION W/ INTRAOCULAR LENS  IMPLANT, BILATERAL Bilateral 08/2017   CENTRAL LINE INSERTION-TUNNELED N/A 04/19/2018   Procedure: CENTRAL LINE INSERTION-TUNNELED;  Surgeon: Renford Dills, MD;  Location: [facility];  Service: Cardiovascular;  Laterality: N/A;   CENTRAL VENOUS CATHETER INSERTION  07/15/2018   Procedure: Insertion Hickman Catheter;  Surgeon: Annice Needy, MD;  Location: [facility];  Service:  Cardiology Consultation   Patient ID: [name] MRN: [id number]; DOB: 05-30-1941  Admit date: 04/09/2023 Date of Consult: 03/30/2023  PCP:  Estell Harpin, MD   [facility] Providers Cardiologist:  None      Consult completed by Dr End  Patient Profile:   Jonathan Mcgrath is a 82 y.o. 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 Clyde Lundborg.  History of Present Illness:   [name] patient was previously evaluated in the emergency department 06/2018 with shortness of breath and elevated troponin.  Echocardiogram showed normal LV systolic function ejection fraction 55% with minimal valvular heart disease but significant diastolic dysfunction.  High-sensitivity troponin elevation of 0.66 and a BNP of 2154.  He had significant improvements after dialysis without any recurrent chest pain or other significant symptoms at that time.   He presented to [facility] via EMS from home with low blood pressure.  Patient states he had dialysis earlier and did not drink disallow 32 ounces of fluid drained.  Per EMS patient was orthostatic positive and was given 500 ml of fluid.  He was found to be hypoxic with oxygen saturations upper 80s and was placed on 2 L of O2 via nasal cannula.  He states he did not been feeling well for the past couple of days.  He went to dialysis on Wednesday and started to have complaints of chest weakness.  And then went to dialysis on Friday and was told that his blood pressure was low.  The ultrasound had an episode of nausea and vomiting with a mild cough.  No significant shortness of breath and no complaints of chest pain.  Initial vital signs: Blood pressure  101/60, pulse 55, temperature 98.6, and oxygen saturation 96% on 2 L of O2 via nasal cannula  Pertinent labs: Hemoglobin 10.7, hematocrit 33.4, blood glucose 119, BUN 35, serum creatinine 5.11, calcium 8.6, albumin 3.3, lactic acid 2.2, BNP 3735.5, high-sensitivity troponin 2621  Imaging: CT of the head without findings of acute intracranial hemorrhage; chest x-ray revealed cardiomegaly with mild pulmonary edema, Pericardial effusion is not excluded  Medications administered in the emergency department: Normal saline 500 mL IV, Benadryl 12.5 mg IV, Tylenol, aspirin 324 mg, heparin infusion ordered and then discontinued  Cardiology was consulted for concerns of NSTEMI  Past Medical History:  Diagnosis Date   Cancer (HCC) 2019   colon   Chronic kidney disease    Diabetes mellitus without complication (HCC)    diet controlled   Hemodialysis access site with arteriovenous graft (HCC)    Hypertension    Infection due to Clostridium septicum (HCC) 08/27/2018   Renal insufficiency    Pt on dialysis x 3 years and receives every Monday, Wednesday and Friday.     Past Surgical History:  Procedure Laterality Date   AV FISTULA PLACEMENT Left 2015   arm   CATARACT EXTRACTION W/ INTRAOCULAR LENS  IMPLANT, BILATERAL Bilateral 08/2017   CENTRAL LINE INSERTION-TUNNELED N/A 04/19/2018   Procedure: CENTRAL LINE INSERTION-TUNNELED;  Surgeon: Renford Dills, MD;  Location: [facility];  Service: Cardiovascular;  Laterality: N/A;   CENTRAL VENOUS CATHETER INSERTION  07/15/2018   Procedure: Insertion Hickman Catheter;  Surgeon: Annice Needy, MD;  Location: [facility];  Service:

## 2023-04-10 NOTE — ED Notes (Signed)
Second floor was called and this RN spoke with the charge nurse.  Charge stated that the pt can go up into the room that the pt is supposed to go into after his cardiac cath is complete.  Pt's belongings packed up with him and the OR team is here to take the pt at this time.

## 2023-04-10 NOTE — ED Notes (Signed)
MD notified that pt troponin is now 28

## 2023-04-10 NOTE — Discharge Summary (Addendum)
85.  No active bleeding     Aortitis (HCC): Patient is supposed to take Augmentin definitely, but stopped taking this medication few months ago -Resumed Augmentin   Stroke (HCC) -Aspirin and Lipitor   History of vitreous hemorrhage This was initial concern and I discussed with patient.  Event of unclear etiology.  Cannot find documentation to support.  Apparently occurred several months ago.  Not an acute  issue.  Discharge Instructions  Discharge Instructions     Diet - low sodium heart healthy   Complete by: As directed    Increase activity slowly   Complete by: As directed       Allergies as of 03/27/2023   No Known Allergies      Medication List     TAKE these medications    acetaminophen 325 MG tablet Commonly known as: TYLENOL Take 2 tablets (650 mg total) by mouth every 4 (four) hours as needed for mild pain (or temp > 37.5 C (99.5 F)).   amoxicillin-clavulanate 500-125 MG tablet Commonly known as: AUGMENTIN Take 1 tablet by mouth daily.   aspirin EC 81 MG tablet Take 1 tablet (81 mg total) by mouth daily. Swallow whole.   atorvastatin 40 MG tablet Commonly known as: LIPITOR Take 1 tablet (40 mg total) by mouth daily.   Auryxia 1 GM 210 MG(Fe) tablet Generic drug: ferric citrate Take 210 mg by mouth 3 (three) times daily with meals.   cinacalcet 60 MG tablet Commonly known as: SENSIPAR Take 60 mg by mouth daily.   famotidine 20 MG tablet Commonly known as: PEPCID Take 20 mg by mouth 2 (two) times daily. 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   heparin 16010 UT/250ML infusion Inject 1,100 Units/hr into the vein continuous. Start taking on: April 11, 2023   ondansetron 4 MG tablet Commonly known as: ZOFRAN Take 4 mg by mouth 3 (three) times daily as needed for nausea/vomiting.   polyethylene glycol powder 17 GM/SCOOP powder Commonly known as: MiraLax Mix full container in 64 ounces of Gatorade or other clear liquid. No Red Liquids   Rena-Vite Rx 1 MG Tabs Take 1 tablet by mouth daily.   multivitamin Tabs tablet Take 1 tablet by mouth daily.   senna-docusate 8.6-50 MG tablet Commonly known as: Senokot-S Take 1 tablet by mouth 2 (two) times daily.   Vitamin D2 10 MCG (400 UNIT) Tabs Take 1 tab daily        No Known Allergies  Consultations: Cardio    Procedures/Studies: ECHOCARDIOGRAM COMPLETE  Result Date: 03/28/2023     ECHOCARDIOGRAM REPORT   Patient Name:   Jonathan Mcgrath Date of Exam: 04/07/2023 Medical Rec #:  [id number]           Height:       73.0 in Accession #:    [id number]          Weight:       200.0 lb Date of Birth:  03-12-1941            BSA:          2.152 m Patient Age:    82 years            BP:           104/63 mmHg Patient Gender: M                   HR:           86 bpm. Exam Location:  [facility] Procedure:  85.  No active bleeding     Aortitis (HCC): Patient is supposed to take Augmentin definitely, but stopped taking this medication few months ago -Resumed Augmentin   Stroke (HCC) -Aspirin and Lipitor   History of vitreous hemorrhage This was initial concern and I discussed with patient.  Event of unclear etiology.  Cannot find documentation to support.  Apparently occurred several months ago.  Not an acute  issue.  Discharge Instructions  Discharge Instructions     Diet - low sodium heart healthy   Complete by: As directed    Increase activity slowly   Complete by: As directed       Allergies as of 03/27/2023   No Known Allergies      Medication List     TAKE these medications    acetaminophen 325 MG tablet Commonly known as: TYLENOL Take 2 tablets (650 mg total) by mouth every 4 (four) hours as needed for mild pain (or temp > 37.5 C (99.5 F)).   amoxicillin-clavulanate 500-125 MG tablet Commonly known as: AUGMENTIN Take 1 tablet by mouth daily.   aspirin EC 81 MG tablet Take 1 tablet (81 mg total) by mouth daily. Swallow whole.   atorvastatin 40 MG tablet Commonly known as: LIPITOR Take 1 tablet (40 mg total) by mouth daily.   Auryxia 1 GM 210 MG(Fe) tablet Generic drug: ferric citrate Take 210 mg by mouth 3 (three) times daily with meals.   cinacalcet 60 MG tablet Commonly known as: SENSIPAR Take 60 mg by mouth daily.   famotidine 20 MG tablet Commonly known as: PEPCID Take 20 mg by mouth 2 (two) times daily. 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   heparin 16010 UT/250ML infusion Inject 1,100 Units/hr into the vein continuous. Start taking on: April 11, 2023   ondansetron 4 MG tablet Commonly known as: ZOFRAN Take 4 mg by mouth 3 (three) times daily as needed for nausea/vomiting.   polyethylene glycol powder 17 GM/SCOOP powder Commonly known as: MiraLax Mix full container in 64 ounces of Gatorade or other clear liquid. No Red Liquids   Rena-Vite Rx 1 MG Tabs Take 1 tablet by mouth daily.   multivitamin Tabs tablet Take 1 tablet by mouth daily.   senna-docusate 8.6-50 MG tablet Commonly known as: Senokot-S Take 1 tablet by mouth 2 (two) times daily.   Vitamin D2 10 MCG (400 UNIT) Tabs Take 1 tab daily        No Known Allergies  Consultations: Cardio    Procedures/Studies: ECHOCARDIOGRAM COMPLETE  Result Date: 03/28/2023     ECHOCARDIOGRAM REPORT   Patient Name:   Jonathan Mcgrath Date of Exam: 04/07/2023 Medical Rec #:  [id number]           Height:       73.0 in Accession #:    [id number]          Weight:       200.0 lb Date of Birth:  03-12-1941            BSA:          2.152 m Patient Age:    82 years            BP:           104/63 mmHg Patient Gender: M                   HR:           86 bpm. Exam Location:  [facility] Procedure:  85.  No active bleeding     Aortitis (HCC): Patient is supposed to take Augmentin definitely, but stopped taking this medication few months ago -Resumed Augmentin   Stroke (HCC) -Aspirin and Lipitor   History of vitreous hemorrhage This was initial concern and I discussed with patient.  Event of unclear etiology.  Cannot find documentation to support.  Apparently occurred several months ago.  Not an acute  issue.  Discharge Instructions  Discharge Instructions     Diet - low sodium heart healthy   Complete by: As directed    Increase activity slowly   Complete by: As directed       Allergies as of 04/08/2023   No Known Allergies      Medication List     TAKE these medications    acetaminophen 325 MG tablet Commonly known as: TYLENOL Take 2 tablets (650 mg total) by mouth every 4 (four) hours as needed for mild pain (or temp > 37.5 C (99.5 F)).   amoxicillin-clavulanate 500-125 MG tablet Commonly known as: AUGMENTIN Take 1 tablet by mouth daily.   aspirin EC 81 MG tablet Take 1 tablet (81 mg total) by mouth daily. Swallow whole.   atorvastatin 40 MG tablet Commonly known as: LIPITOR Take 1 tablet (40 mg total) by mouth daily.   Auryxia 1 GM 210 MG(Fe) tablet Generic drug: ferric citrate Take 210 mg by mouth 3 (three) times daily with meals.   cinacalcet 60 MG tablet Commonly known as: SENSIPAR Take 60 mg by mouth daily.   famotidine 20 MG tablet Commonly known as: PEPCID Take 20 mg by mouth 2 (two) times daily. 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   heparin 16010 UT/250ML infusion Inject 1,100 Units/hr into the vein continuous. Start taking on: April 11, 2023   ondansetron 4 MG tablet Commonly known as: ZOFRAN Take 4 mg by mouth 3 (three) times daily as needed for nausea/vomiting.   polyethylene glycol powder 17 GM/SCOOP powder Commonly known as: MiraLax Mix full container in 64 ounces of Gatorade or other clear liquid. No Red Liquids   Rena-Vite Rx 1 MG Tabs Take 1 tablet by mouth daily.   multivitamin Tabs tablet Take 1 tablet by mouth daily.   senna-docusate 8.6-50 MG tablet Commonly known as: Senokot-S Take 1 tablet by mouth 2 (two) times daily.   Vitamin D2 10 MCG (400 UNIT) Tabs Take 1 tab daily        No Known Allergies  Consultations: Cardio    Procedures/Studies: ECHOCARDIOGRAM COMPLETE  Result Date: 04/13/2023     ECHOCARDIOGRAM REPORT   Patient Name:   Jonathan Mcgrath Date of Exam: 03/27/2023 Medical Rec #:  [id number]           Height:       73.0 in Accession #:    [id number]          Weight:       200.0 lb Date of Birth:  03-12-1941            BSA:          2.152 m Patient Age:    82 years            BP:           104/63 mmHg Patient Gender: M                   HR:           86 bpm. Exam Location:  [facility] Procedure:  85.  No active bleeding     Aortitis (HCC): Patient is supposed to take Augmentin definitely, but stopped taking this medication few months ago -Resumed Augmentin   Stroke (HCC) -Aspirin and Lipitor   History of vitreous hemorrhage This was initial concern and I discussed with patient.  Event of unclear etiology.  Cannot find documentation to support.  Apparently occurred several months ago.  Not an acute  issue.  Discharge Instructions  Discharge Instructions     Diet - low sodium heart healthy   Complete by: As directed    Increase activity slowly   Complete by: As directed       Allergies as of 04/08/2023   No Known Allergies      Medication List     TAKE these medications    acetaminophen 325 MG tablet Commonly known as: TYLENOL Take 2 tablets (650 mg total) by mouth every 4 (four) hours as needed for mild pain (or temp > 37.5 C (99.5 F)).   amoxicillin-clavulanate 500-125 MG tablet Commonly known as: AUGMENTIN Take 1 tablet by mouth daily.   aspirin EC 81 MG tablet Take 1 tablet (81 mg total) by mouth daily. Swallow whole.   atorvastatin 40 MG tablet Commonly known as: LIPITOR Take 1 tablet (40 mg total) by mouth daily.   Auryxia 1 GM 210 MG(Fe) tablet Generic drug: ferric citrate Take 210 mg by mouth 3 (three) times daily with meals.   cinacalcet 60 MG tablet Commonly known as: SENSIPAR Take 60 mg by mouth daily.   famotidine 20 MG tablet Commonly known as: PEPCID Take 20 mg by mouth 2 (two) times daily. 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   heparin 16010 UT/250ML infusion Inject 1,100 Units/hr into the vein continuous. Start taking on: April 11, 2023   ondansetron 4 MG tablet Commonly known as: ZOFRAN Take 4 mg by mouth 3 (three) times daily as needed for nausea/vomiting.   polyethylene glycol powder 17 GM/SCOOP powder Commonly known as: MiraLax Mix full container in 64 ounces of Gatorade or other clear liquid. No Red Liquids   Rena-Vite Rx 1 MG Tabs Take 1 tablet by mouth daily.   multivitamin Tabs tablet Take 1 tablet by mouth daily.   senna-docusate 8.6-50 MG tablet Commonly known as: Senokot-S Take 1 tablet by mouth 2 (two) times daily.   Vitamin D2 10 MCG (400 UNIT) Tabs Take 1 tab daily        No Known Allergies  Consultations: Cardio    Procedures/Studies: ECHOCARDIOGRAM COMPLETE  Result Date: 04/13/2023     ECHOCARDIOGRAM REPORT   Patient Name:   Jonathan Mcgrath Date of Exam: 03/27/2023 Medical Rec #:  [id number]           Height:       73.0 in Accession #:    [id number]          Weight:       200.0 lb Date of Birth:  03-12-1941            BSA:          2.152 m Patient Age:    82 years            BP:           104/63 mmHg Patient Gender: M                   HR:           86 bpm. Exam Location:  [facility] Procedure:  85.  No active bleeding     Aortitis (HCC): Patient is supposed to take Augmentin definitely, but stopped taking this medication few months ago -Resumed Augmentin   Stroke (HCC) -Aspirin and Lipitor   History of vitreous hemorrhage This was initial concern and I discussed with patient.  Event of unclear etiology.  Cannot find documentation to support.  Apparently occurred several months ago.  Not an acute  issue.  Discharge Instructions  Discharge Instructions     Diet - low sodium heart healthy   Complete by: As directed    Increase activity slowly   Complete by: As directed       Allergies as of 03/27/2023   No Known Allergies      Medication List     TAKE these medications    acetaminophen 325 MG tablet Commonly known as: TYLENOL Take 2 tablets (650 mg total) by mouth every 4 (four) hours as needed for mild pain (or temp > 37.5 C (99.5 F)).   amoxicillin-clavulanate 500-125 MG tablet Commonly known as: AUGMENTIN Take 1 tablet by mouth daily.   aspirin EC 81 MG tablet Take 1 tablet (81 mg total) by mouth daily. Swallow whole.   atorvastatin 40 MG tablet Commonly known as: LIPITOR Take 1 tablet (40 mg total) by mouth daily.   Auryxia 1 GM 210 MG(Fe) tablet Generic drug: ferric citrate Take 210 mg by mouth 3 (three) times daily with meals.   cinacalcet 60 MG tablet Commonly known as: SENSIPAR Take 60 mg by mouth daily.   famotidine 20 MG tablet Commonly known as: PEPCID Take 20 mg by mouth 2 (two) times daily. 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   heparin 16010 UT/250ML infusion Inject 1,100 Units/hr into the vein continuous. Start taking on: April 11, 2023   ondansetron 4 MG tablet Commonly known as: ZOFRAN Take 4 mg by mouth 3 (three) times daily as needed for nausea/vomiting.   polyethylene glycol powder 17 GM/SCOOP powder Commonly known as: MiraLax Mix full container in 64 ounces of Gatorade or other clear liquid. No Red Liquids   Rena-Vite Rx 1 MG Tabs Take 1 tablet by mouth daily.   multivitamin Tabs tablet Take 1 tablet by mouth daily.   senna-docusate 8.6-50 MG tablet Commonly known as: Senokot-S Take 1 tablet by mouth 2 (two) times daily.   Vitamin D2 10 MCG (400 UNIT) Tabs Take 1 tab daily        No Known Allergies  Consultations: Cardio    Procedures/Studies: ECHOCARDIOGRAM COMPLETE  Result Date: 03/28/2023     ECHOCARDIOGRAM REPORT   Patient Name:   Jonathan Mcgrath Date of Exam: 04/07/2023 Medical Rec #:  [id number]           Height:       73.0 in Accession #:    [id number]          Weight:       200.0 lb Date of Birth:  03-12-1941            BSA:          2.152 m Patient Age:    82 years            BP:           104/63 mmHg Patient Gender: M                   HR:           86 bpm. Exam Location:  [facility] Procedure:  85.  No active bleeding     Aortitis (HCC): Patient is supposed to take Augmentin definitely, but stopped taking this medication few months ago -Resumed Augmentin   Stroke (HCC) -Aspirin and Lipitor   History of vitreous hemorrhage This was initial concern and I discussed with patient.  Event of unclear etiology.  Cannot find documentation to support.  Apparently occurred several months ago.  Not an acute  issue.  Discharge Instructions  Discharge Instructions     Diet - low sodium heart healthy   Complete by: As directed    Increase activity slowly   Complete by: As directed       Allergies as of 03/27/2023   No Known Allergies      Medication List     TAKE these medications    acetaminophen 325 MG tablet Commonly known as: TYLENOL Take 2 tablets (650 mg total) by mouth every 4 (four) hours as needed for mild pain (or temp > 37.5 C (99.5 F)).   amoxicillin-clavulanate 500-125 MG tablet Commonly known as: AUGMENTIN Take 1 tablet by mouth daily.   aspirin EC 81 MG tablet Take 1 tablet (81 mg total) by mouth daily. Swallow whole.   atorvastatin 40 MG tablet Commonly known as: LIPITOR Take 1 tablet (40 mg total) by mouth daily.   Auryxia 1 GM 210 MG(Fe) tablet Generic drug: ferric citrate Take 210 mg by mouth 3 (three) times daily with meals.   cinacalcet 60 MG tablet Commonly known as: SENSIPAR Take 60 mg by mouth daily.   famotidine 20 MG tablet Commonly known as: PEPCID Take 20 mg by mouth 2 (two) times daily. 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   heparin 16010 UT/250ML infusion Inject 1,100 Units/hr into the vein continuous. Start taking on: April 11, 2023   ondansetron 4 MG tablet Commonly known as: ZOFRAN Take 4 mg by mouth 3 (three) times daily as needed for nausea/vomiting.   polyethylene glycol powder 17 GM/SCOOP powder Commonly known as: MiraLax Mix full container in 64 ounces of Gatorade or other clear liquid. No Red Liquids   Rena-Vite Rx 1 MG Tabs Take 1 tablet by mouth daily.   multivitamin Tabs tablet Take 1 tablet by mouth daily.   senna-docusate 8.6-50 MG tablet Commonly known as: Senokot-S Take 1 tablet by mouth 2 (two) times daily.   Vitamin D2 10 MCG (400 UNIT) Tabs Take 1 tab daily        No Known Allergies  Consultations: Cardio    Procedures/Studies: ECHOCARDIOGRAM COMPLETE  Result Date: 03/28/2023     ECHOCARDIOGRAM REPORT   Patient Name:   Jonathan Mcgrath Date of Exam: 04/07/2023 Medical Rec #:  [id number]           Height:       73.0 in Accession #:    [id number]          Weight:       200.0 lb Date of Birth:  03-12-1941            BSA:          2.152 m Patient Age:    82 years            BP:           104/63 mmHg Patient Gender: M                   HR:           86 bpm. Exam Location:  [facility] Procedure:  85.  No active bleeding     Aortitis (HCC): Patient is supposed to take Augmentin definitely, but stopped taking this medication few months ago -Resumed Augmentin   Stroke (HCC) -Aspirin and Lipitor   History of vitreous hemorrhage This was initial concern and I discussed with patient.  Event of unclear etiology.  Cannot find documentation to support.  Apparently occurred several months ago.  Not an acute  issue.  Discharge Instructions  Discharge Instructions     Diet - low sodium heart healthy   Complete by: As directed    Increase activity slowly   Complete by: As directed       Allergies as of 03/27/2023   No Known Allergies      Medication List     TAKE these medications    acetaminophen 325 MG tablet Commonly known as: TYLENOL Take 2 tablets (650 mg total) by mouth every 4 (four) hours as needed for mild pain (or temp > 37.5 C (99.5 F)).   amoxicillin-clavulanate 500-125 MG tablet Commonly known as: AUGMENTIN Take 1 tablet by mouth daily.   aspirin EC 81 MG tablet Take 1 tablet (81 mg total) by mouth daily. Swallow whole.   atorvastatin 40 MG tablet Commonly known as: LIPITOR Take 1 tablet (40 mg total) by mouth daily.   Auryxia 1 GM 210 MG(Fe) tablet Generic drug: ferric citrate Take 210 mg by mouth 3 (three) times daily with meals.   cinacalcet 60 MG tablet Commonly known as: SENSIPAR Take 60 mg by mouth daily.   famotidine 20 MG tablet Commonly known as: PEPCID Take 20 mg by mouth 2 (two) times daily. 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   heparin 16010 UT/250ML infusion Inject 1,100 Units/hr into the vein continuous. Start taking on: April 11, 2023   ondansetron 4 MG tablet Commonly known as: ZOFRAN Take 4 mg by mouth 3 (three) times daily as needed for nausea/vomiting.   polyethylene glycol powder 17 GM/SCOOP powder Commonly known as: MiraLax Mix full container in 64 ounces of Gatorade or other clear liquid. No Red Liquids   Rena-Vite Rx 1 MG Tabs Take 1 tablet by mouth daily.   multivitamin Tabs tablet Take 1 tablet by mouth daily.   senna-docusate 8.6-50 MG tablet Commonly known as: Senokot-S Take 1 tablet by mouth 2 (two) times daily.   Vitamin D2 10 MCG (400 UNIT) Tabs Take 1 tab daily        No Known Allergies  Consultations: Cardio    Procedures/Studies: ECHOCARDIOGRAM COMPLETE  Result Date: 03/28/2023     ECHOCARDIOGRAM REPORT   Patient Name:   Jonathan Mcgrath Date of Exam: 04/07/2023 Medical Rec #:  [id number]           Height:       73.0 in Accession #:    [id number]          Weight:       200.0 lb Date of Birth:  03-12-1941            BSA:          2.152 m Patient Age:    82 years            BP:           104/63 mmHg Patient Gender: M                   HR:           86 bpm. Exam Location:  [facility] Procedure:  85.  No active bleeding     Aortitis (HCC): Patient is supposed to take Augmentin definitely, but stopped taking this medication few months ago -Resumed Augmentin   Stroke (HCC) -Aspirin and Lipitor   History of vitreous hemorrhage This was initial concern and I discussed with patient.  Event of unclear etiology.  Cannot find documentation to support.  Apparently occurred several months ago.  Not an acute  issue.  Discharge Instructions  Discharge Instructions     Diet - low sodium heart healthy   Complete by: As directed    Increase activity slowly   Complete by: As directed       Allergies as of 03/27/2023   No Known Allergies      Medication List     TAKE these medications    acetaminophen 325 MG tablet Commonly known as: TYLENOL Take 2 tablets (650 mg total) by mouth every 4 (four) hours as needed for mild pain (or temp > 37.5 C (99.5 F)).   amoxicillin-clavulanate 500-125 MG tablet Commonly known as: AUGMENTIN Take 1 tablet by mouth daily.   aspirin EC 81 MG tablet Take 1 tablet (81 mg total) by mouth daily. Swallow whole.   atorvastatin 40 MG tablet Commonly known as: LIPITOR Take 1 tablet (40 mg total) by mouth daily.   Auryxia 1 GM 210 MG(Fe) tablet Generic drug: ferric citrate Take 210 mg by mouth 3 (three) times daily with meals.   cinacalcet 60 MG tablet Commonly known as: SENSIPAR Take 60 mg by mouth daily.   famotidine 20 MG tablet Commonly known as: PEPCID Take 20 mg by mouth 2 (two) times daily. 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   heparin 16010 UT/250ML infusion Inject 1,100 Units/hr into the vein continuous. Start taking on: April 11, 2023   ondansetron 4 MG tablet Commonly known as: ZOFRAN Take 4 mg by mouth 3 (three) times daily as needed for nausea/vomiting.   polyethylene glycol powder 17 GM/SCOOP powder Commonly known as: MiraLax Mix full container in 64 ounces of Gatorade or other clear liquid. No Red Liquids   Rena-Vite Rx 1 MG Tabs Take 1 tablet by mouth daily.   multivitamin Tabs tablet Take 1 tablet by mouth daily.   senna-docusate 8.6-50 MG tablet Commonly known as: Senokot-S Take 1 tablet by mouth 2 (two) times daily.   Vitamin D2 10 MCG (400 UNIT) Tabs Take 1 tab daily        No Known Allergies  Consultations: Cardio    Procedures/Studies: ECHOCARDIOGRAM COMPLETE  Result Date: 03/28/2023     ECHOCARDIOGRAM REPORT   Patient Name:   Jonathan Mcgrath Date of Exam: 04/07/2023 Medical Rec #:  [id number]           Height:       73.0 in Accession #:    [id number]          Weight:       200.0 lb Date of Birth:  03-12-1941            BSA:          2.152 m Patient Age:    82 years            BP:           104/63 mmHg Patient Gender: M                   HR:           86 bpm. Exam Location:  [facility] Procedure:

## 2023-04-10 NOTE — ED Notes (Signed)
Pt used call light and said he needed to have a BM.  RN assisted patient to bedside but pt was unable to have BM.  When RN assisted pt back to bed pt became nauseous and /V in emesis bag.  RN administered PRN medication for /N.

## 2023-04-10 NOTE — Progress Notes (Signed)
must be combined with clinical observations, patient history, and epidemiological information. The expected result is Negative.  Fact Sheet for Patients:  BloggerCourse.com  Fact Sheet for Healthcare Providers:  SeriousBroker.it  This test is no t yet approved or cleared by the [place] FDA and  has been authorized for detection and/or diagnosis of SARS-CoV-2 by FDA under an Emergency Use Authorization (EUA). This EUA will remain  in effect (meaning this test can be used) for the duration of the COVID-19 declaration under Section 564(b)(1) of the Act,  21 U.S.C.section 360bbb-3(b)(1), unless the authorization is terminated  or revoked sooner.       Influenza A by PCR NEGATIVE NEGATIVE Final   Influenza B by PCR NEGATIVE NEGATIVE Final    Comment: (NOTE) The Xpert Xpress SARS-CoV-2/FLU/RSV plus assay is intended as an aid in the diagnosis of influenza from Nasopharyngeal swab specimens and should not be used as a sole basis for treatment. Nasal washings and aspirates are unacceptable for Xpert Xpress SARS-CoV-2/FLU/RSV testing.  Fact Sheet for Patients: BloggerCourse.com  Fact Sheet for Healthcare Providers: SeriousBroker.it  This test is not yet approved or cleared by the [place] FDA and has been authorized for detection and/or diagnosis of SARS-CoV-2 by FDA under an Emergency Use Authorization (EUA). This EUA will remain in effect (meaning this test can be used) for the duration of the COVID-19 declaration under Section 564(b)(1) of the Act, 21 U.S.C. section 360bbb-3(b)(1), unless the authorization is terminated or revoked.     Resp Syncytial Virus by PCR NEGATIVE NEGATIVE Final    Comment: (NOTE) Fact Sheet for Patients: BloggerCourse.com  Fact Sheet for Healthcare Providers: SeriousBroker.it  This test is not yet approved or cleared by the [place] FDA and has been authorized for detection and/or diagnosis of SARS-CoV-2 by FDA under an Emergency Use Authorization (EUA). This EUA will remain in effect (meaning this test can be used) for the duration of the COVID-19 declaration under Section 564(b)(1) of the Act, 21 U.S.C. section 360bbb-3(b)(1), unless the authorization is terminated or revoked.  Performed at [facility], [address] [place], [place] [address]     Coagulation Studies: Recent Labs    04/09/23 May 06, 2129  LABPROT 16.4*  INR 1.3*    Urinalysis: No results for input(s):  "COLORURINE", "LABSPEC", "PHURINE", "GLUCOSEU", "HGBUR", "BILIRUBINUR", "KETONESUR", "PROTEINUR", "UROBILINOGEN", "NITRITE", "LEUKOCYTESUR" in the last 72 hours.  Invalid input(s): "APPERANCEUR"    Imaging: ECHOCARDIOGRAM COMPLETE  Result Date: 03/18/2023    ECHOCARDIOGRAM REPORT   Patient Name:   Jonathan Mcgrath Date of Exam: 03/25/2023 Medical Rec #:  [id number]           Height:       73.0 in Accession #:    [id number]          Weight:       200.0 lb Date of Birth:  1941-06-04            BSA:          2.152 m Patient Age:    82 years            BP:           104/63 mmHg Patient Gender: M                   HR:           86 bpm. Exam Location:  [facility] Procedure: 2D Echo, Cardiac Doppler, Color Doppler and Strain Analysis Indications:  must be combined with clinical observations, patient history, and epidemiological information. The expected result is Negative.  Fact Sheet for Patients:  BloggerCourse.com  Fact Sheet for Healthcare Providers:  SeriousBroker.it  This test is no t yet approved or cleared by the [place] FDA and  has been authorized for detection and/or diagnosis of SARS-CoV-2 by FDA under an Emergency Use Authorization (EUA). This EUA will remain  in effect (meaning this test can be used) for the duration of the COVID-19 declaration under Section 564(b)(1) of the Act,  21 U.S.C.section 360bbb-3(b)(1), unless the authorization is terminated  or revoked sooner.       Influenza A by PCR NEGATIVE NEGATIVE Final   Influenza B by PCR NEGATIVE NEGATIVE Final    Comment: (NOTE) The Xpert Xpress SARS-CoV-2/FLU/RSV plus assay is intended as an aid in the diagnosis of influenza from Nasopharyngeal swab specimens and should not be used as a sole basis for treatment. Nasal washings and aspirates are unacceptable for Xpert Xpress SARS-CoV-2/FLU/RSV testing.  Fact Sheet for Patients: BloggerCourse.com  Fact Sheet for Healthcare Providers: SeriousBroker.it  This test is not yet approved or cleared by the [place] FDA and has been authorized for detection and/or diagnosis of SARS-CoV-2 by FDA under an Emergency Use Authorization (EUA). This EUA will remain in effect (meaning this test can be used) for the duration of the COVID-19 declaration under Section 564(b)(1) of the Act, 21 U.S.C. section 360bbb-3(b)(1), unless the authorization is terminated or revoked.     Resp Syncytial Virus by PCR NEGATIVE NEGATIVE Final    Comment: (NOTE) Fact Sheet for Patients: BloggerCourse.com  Fact Sheet for Healthcare Providers: SeriousBroker.it  This test is not yet approved or cleared by the [place] FDA and has been authorized for detection and/or diagnosis of SARS-CoV-2 by FDA under an Emergency Use Authorization (EUA). This EUA will remain in effect (meaning this test can be used) for the duration of the COVID-19 declaration under Section 564(b)(1) of the Act, 21 U.S.C. section 360bbb-3(b)(1), unless the authorization is terminated or revoked.  Performed at [facility], [address] [place], [place] [address]     Coagulation Studies: Recent Labs    04/09/23 May 06, 2129  LABPROT 16.4*  INR 1.3*    Urinalysis: No results for input(s):  "COLORURINE", "LABSPEC", "PHURINE", "GLUCOSEU", "HGBUR", "BILIRUBINUR", "KETONESUR", "PROTEINUR", "UROBILINOGEN", "NITRITE", "LEUKOCYTESUR" in the last 72 hours.  Invalid input(s): "APPERANCEUR"    Imaging: ECHOCARDIOGRAM COMPLETE  Result Date: 04/13/2023    ECHOCARDIOGRAM REPORT   Patient Name:   Jonathan Mcgrath Date of Exam: 03/31/2023 Medical Rec #:  [id number]           Height:       73.0 in Accession #:    [id number]          Weight:       200.0 lb Date of Birth:  1941-06-04            BSA:          2.152 m Patient Age:    82 years            BP:           104/63 mmHg Patient Gender: M                   HR:           86 bpm. Exam Location:  [facility] Procedure: 2D Echo, Cardiac Doppler, Color Doppler and Strain Analysis Indications:  must be combined with clinical observations, patient history, and epidemiological information. The expected result is Negative.  Fact Sheet for Patients:  BloggerCourse.com  Fact Sheet for Healthcare Providers:  SeriousBroker.it  This test is no t yet approved or cleared by the [place] FDA and  has been authorized for detection and/or diagnosis of SARS-CoV-2 by FDA under an Emergency Use Authorization (EUA). This EUA will remain  in effect (meaning this test can be used) for the duration of the COVID-19 declaration under Section 564(b)(1) of the Act,  21 U.S.C.section 360bbb-3(b)(1), unless the authorization is terminated  or revoked sooner.       Influenza A by PCR NEGATIVE NEGATIVE Final   Influenza B by PCR NEGATIVE NEGATIVE Final    Comment: (NOTE) The Xpert Xpress SARS-CoV-2/FLU/RSV plus assay is intended as an aid in the diagnosis of influenza from Nasopharyngeal swab specimens and should not be used as a sole basis for treatment. Nasal washings and aspirates are unacceptable for Xpert Xpress SARS-CoV-2/FLU/RSV testing.  Fact Sheet for Patients: BloggerCourse.com  Fact Sheet for Healthcare Providers: SeriousBroker.it  This test is not yet approved or cleared by the [place] FDA and has been authorized for detection and/or diagnosis of SARS-CoV-2 by FDA under an Emergency Use Authorization (EUA). This EUA will remain in effect (meaning this test can be used) for the duration of the COVID-19 declaration under Section 564(b)(1) of the Act, 21 U.S.C. section 360bbb-3(b)(1), unless the authorization is terminated or revoked.     Resp Syncytial Virus by PCR NEGATIVE NEGATIVE Final    Comment: (NOTE) Fact Sheet for Patients: BloggerCourse.com  Fact Sheet for Healthcare Providers: SeriousBroker.it  This test is not yet approved or cleared by the [place] FDA and has been authorized for detection and/or diagnosis of SARS-CoV-2 by FDA under an Emergency Use Authorization (EUA). This EUA will remain in effect (meaning this test can be used) for the duration of the COVID-19 declaration under Section 564(b)(1) of the Act, 21 U.S.C. section 360bbb-3(b)(1), unless the authorization is terminated or revoked.  Performed at [facility], [address] [place], [place] [address]     Coagulation Studies: Recent Labs    04/09/23 May 06, 2129  LABPROT 16.4*  INR 1.3*    Urinalysis: No results for input(s):  "COLORURINE", "LABSPEC", "PHURINE", "GLUCOSEU", "HGBUR", "BILIRUBINUR", "KETONESUR", "PROTEINUR", "UROBILINOGEN", "NITRITE", "LEUKOCYTESUR" in the last 72 hours.  Invalid input(s): "APPERANCEUR"    Imaging: ECHOCARDIOGRAM COMPLETE  Result Date: 03/18/2023    ECHOCARDIOGRAM REPORT   Patient Name:   Jonathan Mcgrath Date of Exam: 03/25/2023 Medical Rec #:  [id number]           Height:       73.0 in Accession #:    [id number]          Weight:       200.0 lb Date of Birth:  1941-06-04            BSA:          2.152 m Patient Age:    82 years            BP:           104/63 mmHg Patient Gender: M                   HR:           86 bpm. Exam Location:  [facility] Procedure: 2D Echo, Cardiac Doppler, Color Doppler and Strain Analysis Indications:  must be combined with clinical observations, patient history, and epidemiological information. The expected result is Negative.  Fact Sheet for Patients:  BloggerCourse.com  Fact Sheet for Healthcare Providers:  SeriousBroker.it  This test is no t yet approved or cleared by the [place] FDA and  has been authorized for detection and/or diagnosis of SARS-CoV-2 by FDA under an Emergency Use Authorization (EUA). This EUA will remain  in effect (meaning this test can be used) for the duration of the COVID-19 declaration under Section 564(b)(1) of the Act,  21 U.S.C.section 360bbb-3(b)(1), unless the authorization is terminated  or revoked sooner.       Influenza A by PCR NEGATIVE NEGATIVE Final   Influenza B by PCR NEGATIVE NEGATIVE Final    Comment: (NOTE) The Xpert Xpress SARS-CoV-2/FLU/RSV plus assay is intended as an aid in the diagnosis of influenza from Nasopharyngeal swab specimens and should not be used as a sole basis for treatment. Nasal washings and aspirates are unacceptable for Xpert Xpress SARS-CoV-2/FLU/RSV testing.  Fact Sheet for Patients: BloggerCourse.com  Fact Sheet for Healthcare Providers: SeriousBroker.it  This test is not yet approved or cleared by the [place] FDA and has been authorized for detection and/or diagnosis of SARS-CoV-2 by FDA under an Emergency Use Authorization (EUA). This EUA will remain in effect (meaning this test can be used) for the duration of the COVID-19 declaration under Section 564(b)(1) of the Act, 21 U.S.C. section 360bbb-3(b)(1), unless the authorization is terminated or revoked.     Resp Syncytial Virus by PCR NEGATIVE NEGATIVE Final    Comment: (NOTE) Fact Sheet for Patients: BloggerCourse.com  Fact Sheet for Healthcare Providers: SeriousBroker.it  This test is not yet approved or cleared by the [place] FDA and has been authorized for detection and/or diagnosis of SARS-CoV-2 by FDA under an Emergency Use Authorization (EUA). This EUA will remain in effect (meaning this test can be used) for the duration of the COVID-19 declaration under Section 564(b)(1) of the Act, 21 U.S.C. section 360bbb-3(b)(1), unless the authorization is terminated or revoked.  Performed at [facility], [address] [place], [place] [address]     Coagulation Studies: Recent Labs    04/09/23 May 06, 2129  LABPROT 16.4*  INR 1.3*    Urinalysis: No results for input(s):  "COLORURINE", "LABSPEC", "PHURINE", "GLUCOSEU", "HGBUR", "BILIRUBINUR", "KETONESUR", "PROTEINUR", "UROBILINOGEN", "NITRITE", "LEUKOCYTESUR" in the last 72 hours.  Invalid input(s): "APPERANCEUR"    Imaging: ECHOCARDIOGRAM COMPLETE  Result Date: 04/13/2023    ECHOCARDIOGRAM REPORT   Patient Name:   Jonathan Mcgrath Date of Exam: 03/31/2023 Medical Rec #:  [id number]           Height:       73.0 in Accession #:    [id number]          Weight:       200.0 lb Date of Birth:  1941-06-04            BSA:          2.152 m Patient Age:    82 years            BP:           104/63 mmHg Patient Gender: M                   HR:           86 bpm. Exam Location:  [facility] Procedure: 2D Echo, Cardiac Doppler, Color Doppler and Strain Analysis Indications:  must be combined with clinical observations, patient history, and epidemiological information. The expected result is Negative.  Fact Sheet for Patients:  BloggerCourse.com  Fact Sheet for Healthcare Providers:  SeriousBroker.it  This test is no t yet approved or cleared by the [place] FDA and  has been authorized for detection and/or diagnosis of SARS-CoV-2 by FDA under an Emergency Use Authorization (EUA). This EUA will remain  in effect (meaning this test can be used) for the duration of the COVID-19 declaration under Section 564(b)(1) of the Act,  21 U.S.C.section 360bbb-3(b)(1), unless the authorization is terminated  or revoked sooner.       Influenza A by PCR NEGATIVE NEGATIVE Final   Influenza B by PCR NEGATIVE NEGATIVE Final    Comment: (NOTE) The Xpert Xpress SARS-CoV-2/FLU/RSV plus assay is intended as an aid in the diagnosis of influenza from Nasopharyngeal swab specimens and should not be used as a sole basis for treatment. Nasal washings and aspirates are unacceptable for Xpert Xpress SARS-CoV-2/FLU/RSV testing.  Fact Sheet for Patients: BloggerCourse.com  Fact Sheet for Healthcare Providers: SeriousBroker.it  This test is not yet approved or cleared by the [place] FDA and has been authorized for detection and/or diagnosis of SARS-CoV-2 by FDA under an Emergency Use Authorization (EUA). This EUA will remain in effect (meaning this test can be used) for the duration of the COVID-19 declaration under Section 564(b)(1) of the Act, 21 U.S.C. section 360bbb-3(b)(1), unless the authorization is terminated or revoked.     Resp Syncytial Virus by PCR NEGATIVE NEGATIVE Final    Comment: (NOTE) Fact Sheet for Patients: BloggerCourse.com  Fact Sheet for Healthcare Providers: SeriousBroker.it  This test is not yet approved or cleared by the [place] FDA and has been authorized for detection and/or diagnosis of SARS-CoV-2 by FDA under an Emergency Use Authorization (EUA). This EUA will remain in effect (meaning this test can be used) for the duration of the COVID-19 declaration under Section 564(b)(1) of the Act, 21 U.S.C. section 360bbb-3(b)(1), unless the authorization is terminated or revoked.  Performed at [facility], [address] [place], [place] [address]     Coagulation Studies: Recent Labs    04/09/23 May 06, 2129  LABPROT 16.4*  INR 1.3*    Urinalysis: No results for input(s):  "COLORURINE", "LABSPEC", "PHURINE", "GLUCOSEU", "HGBUR", "BILIRUBINUR", "KETONESUR", "PROTEINUR", "UROBILINOGEN", "NITRITE", "LEUKOCYTESUR" in the last 72 hours.  Invalid input(s): "APPERANCEUR"    Imaging: ECHOCARDIOGRAM COMPLETE  Result Date: 04/13/2023    ECHOCARDIOGRAM REPORT   Patient Name:   Jonathan Mcgrath Date of Exam: 03/31/2023 Medical Rec #:  [id number]           Height:       73.0 in Accession #:    [id number]          Weight:       200.0 lb Date of Birth:  1941-06-04            BSA:          2.152 m Patient Age:    82 years            BP:           104/63 mmHg Patient Gender: M                   HR:           86 bpm. Exam Location:  [facility] Procedure: 2D Echo, Cardiac Doppler, Color Doppler and Strain Analysis Indications:

## 2023-04-10 NOTE — ED Notes (Signed)
MD messaged with morning lab values. Troponin 3446 up from 2621 and K - 5.6
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
## 2023-04-10 NOTE — Interval H&P Note (Signed)
History and Physical Interval Note:  04/05/2023 4:22 PM  Jonathan Mcgrath  has presented today for surgery, with the diagnosis of non ST elevation myocardial infarction.  The various methods of treatment have been discussed with the patient and family. After consideration of risks, benefits and other options for treatment, the patient has consented to  Procedure(s): RIGHT/LEFT HEART CATH AND CORONARY ANGIOGRAPHY (N/A) as a surgical intervention.  The patient's history has been reviewed, patient examined, no change in status, stable for surgery.  I have reviewed the patient's chart and labs.  Questions were answered to the patient's satisfaction.    Cath Lab Visit (complete for each Cath Lab visit)  Clinical Evaluation Leading to the Procedure:   ACS: Yes.    Non-ACS:  N/A  Azora Bonzo

## 2023-04-10 NOTE — H&P (View-Only) (Signed)
Cardiology Consultation   Patient ID: Jonathan Mcgrath MRN: [id number]; DOB: 05-30-1941  Admit date: 04/09/2023 Date of Consult: 03/30/2023  PCP:  Estell Harpin, MD   [facility] Providers Cardiologist:  None      Consult completed by Dr End  Patient Profile:   Jonathan Mcgrath is a 82 y.o. 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 Clyde Lundborg.  History of Present Illness:   Jonathan Mcgrath patient was previously evaluated in the emergency department 06/2018 with shortness of breath and elevated troponin.  Echocardiogram showed normal LV systolic function ejection fraction 55% with minimal valvular heart disease but significant diastolic dysfunction.  High-sensitivity troponin elevation of 0.66 and a BNP of 2154.  He had significant improvements after dialysis without any recurrent chest pain or other significant symptoms at that time.   He presented to [facility] via EMS from home with low blood pressure.  Patient states he had dialysis earlier and did not drink disallow 32 ounces of fluid drained.  Per EMS patient was orthostatic positive and was given 500 ml of fluid.  He was found to be hypoxic with oxygen saturations upper 80s and was placed on 2 L of O2 via nasal cannula.  He states he did not been feeling well for the past couple of days.  He went to dialysis on Wednesday and started to have complaints of chest weakness.  And then went to dialysis on Friday and was told that his blood pressure was low.  The ultrasound had an episode of nausea and vomiting with a mild cough.  No significant shortness of breath and no complaints of chest pain.  Initial vital signs: Blood pressure  101/60, pulse 55, temperature 98.6, and oxygen saturation 96% on 2 L of O2 via nasal cannula  Pertinent labs: Hemoglobin 10.7, hematocrit 33.4, blood glucose 119, BUN 35, serum creatinine 5.11, calcium 8.6, albumin 3.3, lactic acid 2.2, BNP 3735.5, high-sensitivity troponin 2621  Imaging: CT of the head without findings of acute intracranial hemorrhage; chest x-ray revealed cardiomegaly with mild pulmonary edema, Pericardial effusion is not excluded  Medications administered in the emergency department: Normal saline 500 mL IV, Benadryl 12.5 mg IV, Tylenol, aspirin 324 mg, heparin infusion ordered and then discontinued  Cardiology was consulted for concerns of NSTEMI  Past Medical History:  Diagnosis Date   Cancer (HCC) 2019   colon   Chronic kidney disease    Diabetes mellitus without complication (HCC)    diet controlled   Hemodialysis access site with arteriovenous graft (HCC)    Hypertension    Infection due to Clostridium septicum (HCC) 08/27/2018   Renal insufficiency    Pt on dialysis x 3 years and receives every Monday, Wednesday and Friday.     Past Surgical History:  Procedure Laterality Date   AV FISTULA PLACEMENT Left 2015   arm   CATARACT EXTRACTION W/ INTRAOCULAR LENS  IMPLANT, BILATERAL Bilateral 08/2017   CENTRAL LINE INSERTION-TUNNELED N/A 04/19/2018   Procedure: CENTRAL LINE INSERTION-TUNNELED;  Surgeon: Renford Dills, MD;  Location: [facility] INVASIVE CV LAB;  Service: Cardiovascular;  Laterality: N/A;   CENTRAL VENOUS CATHETER INSERTION  07/15/2018   Procedure: Insertion Hickman Catheter;  Surgeon: Annice Needy, MD;  Location: [facility] INVASIVE CV LAB;  Service:  Cardiology Consultation   Patient ID: Jonathan Mcgrath MRN: [id number]; DOB: 05-30-1941  Admit date: 04/09/2023 Date of Consult: 03/23/2023  PCP:  Estell Harpin, MD   [facility] Providers Cardiologist:  None      Consult completed by Dr End  Patient Profile:   Jonathan Mcgrath is a 82 y.o. 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 Clyde Lundborg.  History of Present Illness:   Jonathan Mcgrath patient was previously evaluated in the emergency department 06/2018 with shortness of breath and elevated troponin.  Echocardiogram showed normal LV systolic function ejection fraction 55% with minimal valvular heart disease but significant diastolic dysfunction.  High-sensitivity troponin elevation of 0.66 and a BNP of 2154.  He had significant improvements after dialysis without any recurrent chest pain or other significant symptoms at that time.   He presented to [facility] via EMS from home with low blood pressure.  Patient states he had dialysis earlier and did not drink disallow 32 ounces of fluid drained.  Per EMS patient was orthostatic positive and was given 500 ml of fluid.  He was found to be hypoxic with oxygen saturations upper 80s and was placed on 2 L of O2 via nasal cannula.  He states he did not been feeling well for the past couple of days.  He went to dialysis on Wednesday and started to have complaints of chest weakness.  And then went to dialysis on Friday and was told that his blood pressure was low.  The ultrasound had an episode of nausea and vomiting with a mild cough.  No significant shortness of breath and no complaints of chest pain.  Initial vital signs: Blood pressure  101/60, pulse 55, temperature 98.6, and oxygen saturation 96% on 2 L of O2 via nasal cannula  Pertinent labs: Hemoglobin 10.7, hematocrit 33.4, blood glucose 119, BUN 35, serum creatinine 5.11, calcium 8.6, albumin 3.3, lactic acid 2.2, BNP 3735.5, high-sensitivity troponin 2621  Imaging: CT of the head without findings of acute intracranial hemorrhage; chest x-ray revealed cardiomegaly with mild pulmonary edema, Pericardial effusion is not excluded  Medications administered in the emergency department: Normal saline 500 mL IV, Benadryl 12.5 mg IV, Tylenol, aspirin 324 mg, heparin infusion ordered and then discontinued  Cardiology was consulted for concerns of NSTEMI  Past Medical History:  Diagnosis Date   Cancer (HCC) 2019   colon   Chronic kidney disease    Diabetes mellitus without complication (HCC)    diet controlled   Hemodialysis access site with arteriovenous graft (HCC)    Hypertension    Infection due to Clostridium septicum (HCC) 08/27/2018   Renal insufficiency    Pt on dialysis x 3 years and receives every Monday, Wednesday and Friday.     Past Surgical History:  Procedure Laterality Date   AV FISTULA PLACEMENT Left 2015   arm   CATARACT EXTRACTION W/ INTRAOCULAR LENS  IMPLANT, BILATERAL Bilateral 08/2017   CENTRAL LINE INSERTION-TUNNELED N/A 04/19/2018   Procedure: CENTRAL LINE INSERTION-TUNNELED;  Surgeon: Renford Dills, MD;  Location: [facility] INVASIVE CV LAB;  Service: Cardiovascular;  Laterality: N/A;   CENTRAL VENOUS CATHETER INSERTION  07/15/2018   Procedure: Insertion Hickman Catheter;  Surgeon: Annice Needy, MD;  Location: [facility] INVASIVE CV [facility];  Service:  Cardiology Consultation   Patient ID: Jonathan Mcgrath MRN: [id number]; DOB: 05-30-1941  Admit date: 04/09/2023 Date of Consult: 03/30/2023  PCP:  Estell Harpin, MD   [facility] Providers Cardiologist:  None      Consult completed by Dr End  Patient Profile:   Jonathan Mcgrath is a 82 y.o. 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 Clyde Lundborg.  History of Present Illness:   Jonathan Mcgrath patient was previously evaluated in the emergency department 06/2018 with shortness of breath and elevated troponin.  Echocardiogram showed normal LV systolic function ejection fraction 55% with minimal valvular heart disease but significant diastolic dysfunction.  High-sensitivity troponin elevation of 0.66 and a BNP of 2154.  He had significant improvements after dialysis without any recurrent chest pain or other significant symptoms at that time.   He presented to [facility] via EMS from home with low blood pressure.  Patient states he had dialysis earlier and did not drink disallow 32 ounces of fluid drained.  Per EMS patient was orthostatic positive and was given 500 ml of fluid.  He was found to be hypoxic with oxygen saturations upper 80s and was placed on 2 L of O2 via nasal cannula.  He states he did not been feeling well for the past couple of days.  He went to dialysis on Wednesday and started to have complaints of chest weakness.  And then went to dialysis on Friday and was told that his blood pressure was low.  The ultrasound had an episode of nausea and vomiting with a mild cough.  No significant shortness of breath and no complaints of chest pain.  Initial vital signs: Blood pressure  101/60, pulse 55, temperature 98.6, and oxygen saturation 96% on 2 L of O2 via nasal cannula  Pertinent labs: Hemoglobin 10.7, hematocrit 33.4, blood glucose 119, BUN 35, serum creatinine 5.11, calcium 8.6, albumin 3.3, lactic acid 2.2, BNP 3735.5, high-sensitivity troponin 2621  Imaging: CT of the head without findings of acute intracranial hemorrhage; chest x-ray revealed cardiomegaly with mild pulmonary edema, Pericardial effusion is not excluded  Medications administered in the emergency department: Normal saline 500 mL IV, Benadryl 12.5 mg IV, Tylenol, aspirin 324 mg, heparin infusion ordered and then discontinued  Cardiology was consulted for concerns of NSTEMI  Past Medical History:  Diagnosis Date   Cancer (HCC) 2019   colon   Chronic kidney disease    Diabetes mellitus without complication (HCC)    diet controlled   Hemodialysis access site with arteriovenous graft (HCC)    Hypertension    Infection due to Clostridium septicum (HCC) 08/27/2018   Renal insufficiency    Pt on dialysis x 3 years and receives every Monday, Wednesday and Friday.     Past Surgical History:  Procedure Laterality Date   AV FISTULA PLACEMENT Left 2015   arm   CATARACT EXTRACTION W/ INTRAOCULAR LENS  IMPLANT, BILATERAL Bilateral 08/2017   CENTRAL LINE INSERTION-TUNNELED N/A 04/19/2018   Procedure: CENTRAL LINE INSERTION-TUNNELED;  Surgeon: Renford Dills, MD;  Location: [facility] INVASIVE CV [facility];  Service: Cardiovascular;  Laterality: N/A;   CENTRAL VENOUS CATHETER INSERTION  07/15/2018   Procedure: Insertion Hickman Catheter;  Surgeon: Annice Needy, MD;  Location: [facility] INVASIVE CV [facility];  Service:  Cardiology Consultation   Patient ID: Jonathan Mcgrath MRN: [id number]; DOB: 05-30-1941  Admit date: 04/09/2023 Date of Consult: 03/23/2023  PCP:  Estell Harpin, MD   [facility] Providers Cardiologist:  None      Consult completed by Dr End  Patient Profile:   Jonathan Mcgrath is a 82 y.o. 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 Clyde Lundborg.  History of Present Illness:   Jonathan Mcgrath patient was previously evaluated in the emergency department 06/2018 with shortness of breath and elevated troponin.  Echocardiogram showed normal LV systolic function ejection fraction 55% with minimal valvular heart disease but significant diastolic dysfunction.  High-sensitivity troponin elevation of 0.66 and a BNP of 2154.  He had significant improvements after dialysis without any recurrent chest pain or other significant symptoms at that time.   He presented to [facility] via EMS from home with low blood pressure.  Patient states he had dialysis earlier and did not drink disallow 32 ounces of fluid drained.  Per EMS patient was orthostatic positive and was given 500 ml of fluid.  He was found to be hypoxic with oxygen saturations upper 80s and was placed on 2 L of O2 via nasal cannula.  He states he did not been feeling well for the past couple of days.  He went to dialysis on Wednesday and started to have complaints of chest weakness.  And then went to dialysis on Friday and was told that his blood pressure was low.  The ultrasound had an episode of nausea and vomiting with a mild cough.  No significant shortness of breath and no complaints of chest pain.  Initial vital signs: Blood pressure  101/60, pulse 55, temperature 98.6, and oxygen saturation 96% on 2 L of O2 via nasal cannula  Pertinent labs: Hemoglobin 10.7, hematocrit 33.4, blood glucose 119, BUN 35, serum creatinine 5.11, calcium 8.6, albumin 3.3, lactic acid 2.2, BNP 3735.5, high-sensitivity troponin 2621  Imaging: CT of the head without findings of acute intracranial hemorrhage; chest x-ray revealed cardiomegaly with mild pulmonary edema, Pericardial effusion is not excluded  Medications administered in the emergency department: Normal saline 500 mL IV, Benadryl 12.5 mg IV, Tylenol, aspirin 324 mg, heparin infusion ordered and then discontinued  Cardiology was consulted for concerns of NSTEMI  Past Medical History:  Diagnosis Date   Cancer (HCC) 2019   colon   Chronic kidney disease    Diabetes mellitus without complication (HCC)    diet controlled   Hemodialysis access site with arteriovenous graft (HCC)    Hypertension    Infection due to Clostridium septicum (HCC) 08/27/2018   Renal insufficiency    Pt on dialysis x 3 years and receives every Monday, Wednesday and Friday.     Past Surgical History:  Procedure Laterality Date   AV FISTULA PLACEMENT Left 2015   arm   CATARACT EXTRACTION W/ INTRAOCULAR LENS  IMPLANT, BILATERAL Bilateral 08/2017   CENTRAL LINE INSERTION-TUNNELED N/A 04/19/2018   Procedure: CENTRAL LINE INSERTION-TUNNELED;  Surgeon: Renford Dills, MD;  Location: [facility] INVAS[facility] CV [facility];  Service: Cardiovascular;  Laterality: N/A;   CENTRAL VENOUS CATHETER INSERTION  07/15/2018   Procedure: Insertion Hickman Catheter;  Surgeon: Annice Needy, MD;  Location: [facility] CV [facility];  Service:  Cardiology Consultation   Patient ID: Jonathan Mcgrath MRN: [id number]; DOB: 05-30-1941  Admit date: 04/09/2023 Date of Consult: 03/30/2023  PCP:  Estell Harpin, MD   [facility] Providers Cardiologist:  None      Consult completed by Dr End  Patient Profile:   Jonathan Mcgrath is a 82 y.o. 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 Clyde Lundborg.  History of Present Illness:   Jonathan Mcgrath patient was previously evaluated in the emergency department 06/2018 with shortness of breath and elevated troponin.  Echocardiogram showed normal LV systolic function ejection fraction 55% with minimal valvular heart disease but significant diastolic dysfunction.  High-sensitivity troponin elevation of 0.66 and a BNP of 2154.  He had significant improvements after dialysis without any recurrent chest pain or other significant symptoms at that time.   He presented to [facility] via EMS from home with low blood pressure.  Patient states he had dialysis earlier and did not drink disallow 32 ounces of fluid drained.  Per EMS patient was orthostatic positive and was given 500 ml of fluid.  He was found to be hypoxic with oxygen saturations upper 80s and was placed on 2 L of O2 via nasal cannula.  He states he did not been feeling well for the past couple of days.  He went to dialysis on Wednesday and started to have complaints of chest weakness.  And then went to dialysis on Friday and was told that his blood pressure was low.  The ultrasound had an episode of nausea and vomiting with a mild cough.  No significant shortness of breath and no complaints of chest pain.  Initial vital signs: Blood pressure  101/60, pulse 55, temperature 98.6, and oxygen saturation 96% on 2 L of O2 via nasal cannula  Pertinent labs: Hemoglobin 10.7, hematocrit 33.4, blood glucose 119, BUN 35, serum creatinine 5.11, calcium 8.6, albumin 3.3, lactic acid 2.2, BNP 3735.5, high-sensitivity troponin 2621  Imaging: CT of the head without findings of acute intracranial hemorrhage; chest x-ray revealed cardiomegaly with mild pulmonary edema, Pericardial effusion is not excluded  Medications administered in the emergency department: Normal saline 500 mL IV, Benadryl 12.5 mg IV, Tylenol, aspirin 324 mg, heparin infusion ordered and then discontinued  Cardiology was consulted for concerns of NSTEMI  Past Medical History:  Diagnosis Date   Cancer (HCC) 2019   colon   Chronic kidney disease    Diabetes mellitus without complication (HCC)    diet controlled   Hemodialysis access site with arteriovenous graft (HCC)    Hypertension    Infection due to Clostridium septicum (HCC) 08/27/2018   Renal insufficiency    Pt on dialysis x 3 years and receives every Monday, Wednesday and Friday.     Past Surgical History:  Procedure Laterality Date   AV FISTULA PLACEMENT Left 2015   arm   CATARACT EXTRACTION W/ INTRAOCULAR LENS  IMPLANT, BILATERAL Bilateral 08/2017   CENTRAL LINE INSERTION-TUNNELED N/A 04/19/2018   Procedure: CENTRAL LINE INSERTION-TUNNELED;  Surgeon: Renford Dills, MD;  Location: [facility] INV[facility] CV [facility];  Service: Cardiovascular;  Laterality: N/A;   CENTRAL VENOUS CATHETER INSERTION  07/15/2018   Procedure: Insertion Hickman Catheter;  Surgeon: Annice Needy, MD;  Location: [facility];  Service:  Cardiology Consultation   Patient ID: Jonathan Mcgrath MRN: [id number]; DOB: 05-30-1941  Admit date: 04/09/2023 Date of Consult: 03/30/2023  PCP:  Estell Harpin, MD   [facility] Providers Cardiologist:  None      Consult completed by Dr End  Patient Profile:   Jonathan Mcgrath is a 82 y.o. 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 Clyde Lundborg.  History of Present Illness:   Jonathan Mcgrath patient was previously evaluated in the emergency department 06/2018 with shortness of breath and elevated troponin.  Echocardiogram showed normal LV systolic function ejection fraction 55% with minimal valvular heart disease but significant diastolic dysfunction.  High-sensitivity troponin elevation of 0.66 and a BNP of 2154.  He had significant improvements after dialysis without any recurrent chest pain or other significant symptoms at that time.   He presented to [facility] via EMS from home with low blood pressure.  Patient states he had dialysis earlier and did not drink disallow 32 ounces of fluid drained.  Per EMS patient was orthostatic positive and was given 500 ml of fluid.  He was found to be hypoxic with oxygen saturations upper 80s and was placed on 2 L of O2 via nasal cannula.  He states he did not been feeling well for the past couple of days.  He went to dialysis on Wednesday and started to have complaints of chest weakness.  And then went to dialysis on Friday and was told that his blood pressure was low.  The ultrasound had an episode of nausea and vomiting with a mild cough.  No significant shortness of breath and no complaints of chest pain.  Initial vital signs: Blood pressure  101/60, pulse 55, temperature 98.6, and oxygen saturation 96% on 2 L of O2 via nasal cannula  Pertinent labs: Hemoglobin 10.7, hematocrit 33.4, blood glucose 119, BUN 35, serum creatinine 5.11, calcium 8.6, albumin 3.3, lactic acid 2.2, BNP 3735.5, high-sensitivity troponin 2621  Imaging: CT of the head without findings of acute intracranial hemorrhage; chest x-ray revealed cardiomegaly with mild pulmonary edema, Pericardial effusion is not excluded  Medications administered in the emergency department: Normal saline 500 mL IV, Benadryl 12.5 mg IV, Tylenol, aspirin 324 mg, heparin infusion ordered and then discontinued  Cardiology was consulted for concerns of NSTEMI  Past Medical History:  Diagnosis Date   Cancer (HCC) 2019   colon   Chronic kidney disease    Diabetes mellitus without complication (HCC)    diet controlled   Hemodialysis access site with arteriovenous graft (HCC)    Hypertension    Infection due to Clostridium septicum (HCC) 08/27/2018   Renal insufficiency    Pt on dialysis x 3 years and receives every Monday, Wednesday and Friday.     Past Surgical History:  Procedure Laterality Date   AV FISTULA PLACEMENT Left 2015   arm   CATARACT EXTRACTION W/ INTRAOCULAR LENS  IMPLANT, BILATERAL Bilateral 08/2017   CENTRAL LINE INSERTION-TUNNELED N/A 04/19/2018   Procedure: CENTRAL LINE INSERTION-TUNNELED;  Surgeon: Renford Dills, MD;  Location: [facility] INVAS[facility] CV [facility];  Service: Cardiovascular;  Laterality: N/A;   CENTRAL VENOUS CATHETER INSERTION  07/15/2018   Procedure: Insertion Hickman Catheter;  Surgeon: Annice Needy, MD;  Location: [facility] IN[facility];  Service:

## 2023-04-10 NOTE — Progress Notes (Addendum)
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  eICU Interventions  New Patient Evaluation.        Migdalia Dk 04/07/2023, 9:37 PM

## 2023-04-11 ENCOUNTER — Encounter: Payer: Self-pay | Admitting: Internal Medicine

## 2023-04-11 DIAGNOSIS — Z66 Do not resuscitate: Secondary | ICD-10-CM

## 2023-04-11 DIAGNOSIS — Z7189 Other specified counseling: Secondary | ICD-10-CM

## 2023-04-11 DIAGNOSIS — I429 Cardiomyopathy, unspecified: Secondary | ICD-10-CM | POA: Diagnosis not present

## 2023-04-11 DIAGNOSIS — I361 Nonrheumatic tricuspid (valve) insufficiency: Secondary | ICD-10-CM | POA: Diagnosis not present

## 2023-04-11 DIAGNOSIS — I214 Non-ST elevation (NSTEMI) myocardial infarction: Principal | ICD-10-CM

## 2023-04-11 DIAGNOSIS — I350 Nonrheumatic aortic (valve) stenosis: Secondary | ICD-10-CM

## 2023-04-11 DIAGNOSIS — I2510 Atherosclerotic heart disease of native coronary artery without angina pectoris: Secondary | ICD-10-CM | POA: Diagnosis not present

## 2023-04-11 DIAGNOSIS — N186 End stage renal disease: Secondary | ICD-10-CM | POA: Diagnosis not present

## 2023-04-11 LAB — MAGNESIUM: Magnesium: 2.1 mg/dL (ref 1.7–2.4)

## 2023-04-11 LAB — BASIC METABOLIC PANEL
Anion gap: 15 (ref 5–15)
BUN: 52 mg/dL — ABNORMAL HIGH (ref 8–23)
CO2: 24 mmol/L (ref 22–32)
Calcium: 8.6 mg/dL — ABNORMAL LOW (ref 8.9–10.3)
Chloride: 97 mmol/L — ABNORMAL LOW (ref 98–111)
Creatinine, Ser: 7.05 mg/dL — ABNORMAL HIGH (ref 0.61–1.24)
GFR, Estimated: 7 mL/min — ABNORMAL LOW (ref >60)
Glucose, Bld: 97 mg/dL (ref 70–99)
Potassium: 5.5 mmol/L — ABNORMAL HIGH (ref 3.5–5.1)
Sodium: 136 mmol/L (ref 135–145)

## 2023-04-11 LAB — TROPONIN I (HIGH SENSITIVITY): Troponin I (High Sensitivity): 17871 ng/L (ref <18)

## 2023-04-11 LAB — CBC
HCT: 33.8 % — ABNORMAL LOW (ref 39.0–52.0)
Hemoglobin: 10.9 g/dL — ABNORMAL LOW (ref 13.0–17.0)
MCH: 31.3 pg (ref 26.0–34.0)
MCHC: 32.2 g/dL (ref 30.0–36.0)
MCV: 97.1 fL (ref 80.0–100.0)
Platelets: 83 10*3/uL — ABNORMAL LOW (ref 150–400)
RBC: 3.48 MIL/uL — ABNORMAL LOW (ref 4.22–5.81)
RDW: 15 % (ref 11.5–15.5)
WBC: 9.5 10*3/uL (ref 4.0–10.5)
nRBC: 0.4 % — ABNORMAL HIGH (ref 0.0–0.2)

## 2023-04-11 LAB — HEPATITIS B SURFACE ANTIGEN: Hepatitis B Surface Ag: NONREACTIVE

## 2023-04-11 LAB — PHOSPHORUS: Phosphorus: 6.2 mg/dL — ABNORMAL HIGH (ref 2.5–4.6)

## 2023-04-11 LAB — GLUCOSE, CAPILLARY
Glucose-Capillary: 93 mg/dL (ref 70–99)
Glucose-Capillary: 99 mg/dL (ref 70–99)

## 2023-04-11 LAB — HEPATITIS B SURFACE ANTIBODY, QUANTITATIVE: Hep B S AB Quant (Post): 61.3 m[IU]/mL

## 2023-04-11 LAB — HEPARIN LEVEL (UNFRACTIONATED)
Heparin Unfractionated: 0.1 IU/mL — ABNORMAL LOW (ref 0.30–0.70)
Heparin Unfractionated: 0.11 IU/mL — ABNORMAL LOW (ref 0.30–0.70)

## 2023-04-11 MED ORDER — NITROGLYCERIN 0.4 MG SL SUBL: 0.4000 mg | SUBLINGUAL_TABLET | SUBLINGUAL | Status: DC | PRN

## 2023-04-11 MED ORDER — IOHEXOL 300 MG/ML  SOLN
INTRAMUSCULAR | Status: AC | PRN
  Administered 2023-04-10: 50 mL

## 2023-04-11 MED ORDER — ACETAMINOPHEN 325 MG PO TABS: 650.0000 mg | ORAL_TABLET | ORAL | Status: DC | PRN

## 2023-04-11 MED ORDER — CHLORHEXIDINE GLUCONATE CLOTH 2 % EX PADS
6.0000 | MEDICATED_PAD | Freq: Every day | CUTANEOUS | Status: DC
  Administered 2023-04-17: 6 via TOPICAL

## 2023-04-11 MED ORDER — AMOXICILLIN-POT CLAVULANATE 500-125 MG PO TABS
1.0000 | ORAL_TABLET | Freq: Every day | ORAL | Status: DC
  Administered 2023-04-11: 1 via ORAL
  Filled 2023-04-11 (×2): qty 1

## 2023-04-11 MED ORDER — FERRIC CITRATE 1 GM 210 MG(FE) PO TABS
420.0000 mg | ORAL_TABLET | Freq: Three times a day (TID) | ORAL | Status: DC
  Administered 2023-04-11 – 2023-04-13 (×5): 420 mg via ORAL
  Filled 2023-04-11 (×7): qty 2

## 2023-04-11 MED ORDER — ATORVASTATIN CALCIUM 40 MG PO TABS
40.0000 mg | ORAL_TABLET | Freq: Every day | ORAL | Status: DC
  Administered 2023-04-11 – 2023-04-13 (×3): 40 mg via ORAL
  Filled 2023-04-11: qty 1
  Filled 2023-04-11 (×2): qty 4

## 2023-04-11 MED ORDER — MIDODRINE HCL 5 MG PO TABS
10.0000 mg | ORAL_TABLET | Freq: Once | ORAL | Status: AC
  Administered 2023-04-11: 10 mg via ORAL
  Filled 2023-04-11: qty 2

## 2023-04-11 MED ORDER — LIDOCAINE HCL (PF) 1 % IJ SOLN: 5.0000 mL | INTRAMUSCULAR | Status: DC | PRN

## 2023-04-11 MED ORDER — ALBUMIN HUMAN 25 % IV SOLN
25.0000 g | Freq: Once | INTRAVENOUS | Status: AC
  Administered 2023-04-11: 25 g via INTRAVENOUS
  Filled 2023-04-11: qty 100

## 2023-04-11 MED ORDER — HEPARIN SODIUM (PORCINE) 1000 UNIT/ML DIALYSIS: 1000.0000 [IU] | INTRAMUSCULAR | Status: DC | PRN

## 2023-04-11 MED ORDER — PENTAFLUOROPROP-TETRAFLUOROETH EX AERO: 1.0000 | INHALATION_SPRAY | CUTANEOUS | Status: DC | PRN

## 2023-04-11 MED ORDER — ASPIRIN 81 MG PO CHEW
324.0000 mg | CHEWABLE_TABLET | ORAL | Status: AC
  Administered 2023-04-11: 324 mg via ORAL
  Filled 2023-04-11: qty 4

## 2023-04-11 MED ORDER — AMOXICILLIN-POT CLAVULANATE 500-125 MG PO TABS
1.0000 | ORAL_TABLET | Freq: Once | ORAL | Status: AC
  Administered 2023-04-11: 1 via ORAL
  Filled 2023-04-11: qty 1

## 2023-04-11 MED ORDER — ORAL CARE MOUTH RINSE: 15.0000 mL | OROMUCOSAL | Status: DC | PRN

## 2023-04-11 MED ORDER — LIDOCAINE-PRILOCAINE 2.5-2.5 % EX CREA: 1.0000 | TOPICAL_CREAM | CUTANEOUS | Status: DC | PRN

## 2023-04-11 MED ORDER — ANTICOAGULANT SODIUM CITRATE 4% (200MG/5ML) IV SOLN: 5.0000 mL | Status: DC | PRN

## 2023-04-11 MED ORDER — ASPIRIN 81 MG PO TBEC
81.0000 mg | DELAYED_RELEASE_TABLET | Freq: Every day | ORAL | Status: DC
  Administered 2023-04-12 – 2023-04-16 (×5): 81 mg via ORAL
  Filled 2023-04-11 (×5): qty 1

## 2023-04-11 MED ORDER — AMOXICILLIN-POT CLAVULANATE 500-125 MG PO TABS
1.0000 | ORAL_TABLET | Freq: Every day | ORAL | Status: DC
  Filled 2023-04-11: qty 1

## 2023-04-11 MED ORDER — ASPIRIN 300 MG RE SUPP: 300.0000 mg | RECTAL | Status: AC

## 2023-04-11 MED ORDER — HEPARIN (PORCINE) 25000 UT/250ML-% IV SOLN
1800.0000 [IU]/h | INTRAVENOUS | Status: DC
  Administered 2023-04-11: 1100 [IU]/h via INTRAVENOUS
  Administered 2023-04-12: 1550 [IU]/h via INTRAVENOUS
  Administered 2023-04-13: 1650 [IU]/h via INTRAVENOUS
  Administered 2023-04-14: 1800 [IU]/h via INTRAVENOUS
  Administered 2023-04-14: 1650 [IU]/h via INTRAVENOUS
  Administered 2023-04-15 (×2): 1800 [IU]/h via INTRAVENOUS
  Filled 2023-04-11 (×9): qty 250

## 2023-04-11 MED ORDER — ALTEPLASE 2 MG IJ SOLR
2.0000 mg | Freq: Once | INTRAMUSCULAR | Status: AC | PRN
  Filled 2023-04-11: qty 2

## 2023-04-11 MED ORDER — SODIUM ZIRCONIUM CYCLOSILICATE 10 G PO PACK
10.0000 g | PACK | Freq: Once | ORAL | Status: AC
  Administered 2023-04-11: 10 g via ORAL
  Filled 2023-04-11: qty 1

## 2023-04-11 MED ORDER — HEPARIN SODIUM (PORCINE) 1000 UNIT/ML DIALYSIS
1000.0000 [IU] | INTRAMUSCULAR | Status: DC | PRN
  Administered 2023-04-18: 1000 [IU]

## 2023-04-11 NOTE — Progress Notes (Signed)
   04/11/23 1730  Vitals  Temp (!) 97 F (36.1 C)  Temp Source Oral  BP 99/64  MAP (mmHg) 76  BP Location Right Arm  BP Method Automatic  Patient Position (if appropriate) Lying  Pulse Rate 88  ECG Heart Rate 89  Resp 14  Oxygen Therapy  SpO2 92 %  During Treatment Monitoring  Blood Flow Rate (mL/min) 0 mL/min  Arterial Pressure (mmHg) -45.45 mmHg  Venous Pressure (mmHg) 74.34 mmHg  TMP (mmHg) -5.66 mmHg  Ultrafiltration Rate (mL/min) 267 mL/min  Dialysate Flow Rate (mL/min) 299 ml/min  Dialysate Potassium Concentration 2  Dialysate Calcium Concentration 2.5  Duration of HD Treatment -hour(s) 3 hour(s)  Cumulative Fluid Removed (mL) per Treatment  500.06  Intra-Hemodialysis Comments Tolerated well;Tx completed  Post Treatment  Dialyzer Clearance Clear  Liters Processed 72  Fluid Removed (mL) 500 mL  Tolerated HD Treatment Yes  Post-Hemodialysis Comments goal met b/p tolerated  Note  Patient Observations alert no problems to note tolerated well   Received patient in bed to unit.  Alert and oriented.  Informed consent signed and in chart.   TX duration:3  Patient tolerated well.  Transported back to the room  Alert, without acute distress.  Hand-off given to patient's nurse.   Access used: Lua fistula Access issues: none  Total UF removed: 500 Medication(s) given: albumin 12.5mg  Post HD VS: see above Post HD weight: 89.1kg   [facility]

## 2023-04-11 NOTE — Progress Notes (Signed)
ANTICOAGULATION CONSULT NOTE  Pharmacy Consult for heparin gtt Indication: chest pain/ACS  No Known Allergies  Patient Measurements: Height: 6\' 1"  (185.4 cm) Weight: 89.1 kg (196 lb 6.9 oz) IBW/kg (Calculated) : 79.9 Heparin Dosing Weight: 90.7 kg  Vital Signs: Temp: 99.3 F (37.4 C) (09/25 2219) Temp Source: Oral (09/25 2219) BP: 95/60 (09/25 2000) Pulse Rate: 89 (09/25 2100)  Labs: Recent Labs    04/09/23 2130 03/21/2023 0208 04/14/2023 0509 04/05/2023 1018 03/20/2023 1638 04/16/2023 1642 04/05/2023 2103 04/11/23 0101 04/11/23 0248 04/11/23 0940 04/11/23 2137  HGB 10.7* 10.2*  --   --    < > 10.9* 10.7*  --  10.9*  --   --   HCT 33.4* 31.6*  --   --    < > 32.0* 32.8*  --  33.8*  --   --   PLT 85* 81*  --   --   --   --  80*  --  83*  --   --   APTT 33  --   --   --   --   --   --   --   --   --   --   LABPROT 16.4*  --   --   --   --   --  16.1*  --   --   --   --   INR 1.3*  --   --   --   --   --  1.3*  --   --   --   --   HEPARINUNFRC  --   --   --   --   --   --   --   --   --  0.10* 0.11*  CREATININE 5.11* 5.69*  --   --   --   --  6.87*  --  7.05*  --   --   TROPONINIHS 2,621* 3,446*   < > 9,478*  --   --  16,162* 17,871*  --   --   --    < > = values in this interval not displayed.    Estimated Creatinine Clearance: 9.1 mL/min (A) (by C-G formula based on SCr of 7.05 mg/dL (H)).   Medical History: Past Medical History:  Diagnosis Date   Cancer (HCC) 2019   colon   Chronic kidney disease    Diabetes mellitus without complication (HCC)    diet controlled   Hemodialysis access site with arteriovenous graft (HCC)    Hypertension    Infection due to Clostridium septicum (HCC) 08/27/2018   Renal insufficiency    Pt on dialysis x 3 years and receives every Monday, Wednesday and Friday.    Assessment: 82 yo M with NSTEMI taken to cath on 9/24 and found to have extensive disease. He was transferred to [facility] for definitive management. Pharmacy consulted to start  heparin infusion 8hr after sheath pull.   Heparin level subtherapeutic (0.11) on infusion at 1300 units/hr. No issues with line or bleeding reported per RN.  Goal of Therapy:  Heparin level 0.3-0.7 units/ml Monitor platelets by anticoagulation protocol: Yes   Plan:  Increase heparin drip to 1550 units/hr Repeat heparin level in 8 hrs  Christoper Fabian, PharmD, BCPS Please see [email or web address] for complete clinical pharmacist phone list  04/11/2023 10:46 PM

## 2023-04-11 NOTE — Progress Notes (Addendum)
PCCM Interval Progress Note:  Contacted Dr. Signe Colt (on-call for Nephrology/[facility]) regarding new CRRT consult for Mr. Hollifield for volume removal.  After long discussion re: CRRT timing, decided to hold CRRT until the AM. We reviewed patient's chart and could not find documentation from [facility] as far as what was done (volume removal/UF, clearance) during his brief dialysis session 9/24 and Dr. Signe Colt would like to speak to the HD unit/provider at [facility] prior to initiation of dialysis here. We also discussed that we may be able to reattempt intermittent HD with low-dose pressor support if needed to spare patient an additional line/procedure.   Given that he is clinically stable and comfortable at present (HR 82, BP 103/64, RR 18 without increased WOB, SpO2 95% on 4LNC), agree with holding further CRRT initiation until further evaluation in the morning including TCTS/Cardiology input. Should patient's clinical status change or deteriorate, we can certainly revisit this tonight and reevaluate. I conveyed this to Dr. Elayne Guerin with Cardiology.  Tim Lair, PA-C [facility] 04/11/23 12:58 AM  Please see [email or web address] for pager details.  From 7A-7P if no response, please call [phone] After hours, please call ELink [phone]

## 2023-04-11 NOTE — Progress Notes (Signed)
NAME:  Jonathan Mcgrath, MRN:  [id number], DOB:  [birth year], LOS: 1 ADMISSION DATE:  04/01/2023, CONSULTATION DATE:  03/31/2023 REFERRING MD:  End - [facility] CHIEF COMPLAINT:  Weakness, NSTEMI  History of Present Illness:  82 year old man admitted to [facility] CVICU 9/24 as a transfer from [facility] for NSTEMI and low flow state. PMHx significant for HTN, CHF (Echo 9/24 EF 20-25%), aortitis (on Augmentin indefinitely, reportedly has not taken this for months), CVA, T2DM, ESRD (on HD MWF via LUE AVF), colon CA s/p hemicolectomy (2019).   Patient was initially admitted to [facility] 9/23 with weakness x 1 week. He was found to have NSTEMI and acute HFrEF c/b low flow/low gradient AS. He was started on Heparin infusion (of note, history of vitreous hemorrhage of eyes 3 months ago; risks/benefits of heparin were apparently discussed with ophthalmology and it was felt benefit outweighed risk). Plan was for volume removal via HD given his ESRD status.  Echo 9/24 demonstrated EF 20-25% with global hypokinesis and septal dyskinesis related to LBBB, mildly dilated RV with mod reduced fx, mod to severe PAH, small pericardial effusion, AV severely thickened with TR, mod to severe TR and mod MR. 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 HD was attempted 9/24, however due to hypotension patient was only able to tolerate 2L fluid removal prior to cessation.  Decision to transfer patient to [facility] for further Cardiology input, TCTS consultation and possible initiation of CRRT initiation. PCCM consulted 9/24 for assistance with management.  Pertinent Medical History:   Past Medical History:  Diagnosis Date   Cancer (HCC) 2019   colon   Chronic kidney disease    Diabetes mellitus without complication (HCC)    diet controlled   Hemodialysis access site with arteriovenous graft (HCC)     Hypertension    Infection due to Clostridium septicum (HCC) 08/27/2018   Renal insufficiency    Pt on dialysis x 3 years and receives every Monday, Wednesday and Friday.    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 Transfer to [facility] CVICU.  Interim History / Subjective:   Code status updated   Objective:  Blood pressure 110/61, pulse 82, temperature 98.1 F (36.7 C), temperature source Oral, resp. rate 18, height 6\' 1"  (1.854 m), weight 89.1 kg, SpO2 95%.        Intake/Output Summary (Last 24 hours) at 04/11/2023 0718 Last data filed at 04/11/2023 0300 Gross per 24 hour  Intake --  Output 0 ml  Net 0 ml   Filed Weights   04/11/23 0000 04/11/23 0500  Weight: 89.1 kg 89.1 kg    Physical Examination: General: Chronically ill elderly M NAD  HEENT: NCAT pink mm  Neuro: AAOx4  CV: rr cap refill < 3 sec. +murmur  PULM: symmetrical chest expansion even unlabored WU:JWJX ndnt  Extremities: Trace edema. LUE AVF. Skin: c/d/w Labs/imaging personally reviewed:  Echo 9/24 > EF 20-25% with global HK and septal dyskinesis related to LBBB, mildly dilated RV with mod reduced fx, mod to severe PAH, small pericardial effusion, AV severely thickened with TR, mod to severe TR and mod MR. [facility] 9/24 >  NAME:  Jonathan Mcgrath, MRN:  [id number], DOB:  [birth year], LOS: 1 ADMISSION DATE:  03/18/2023, CONSULTATION DATE:  03/31/2023 REFERRING MD:  End - [facility] CHIEF COMPLAINT:  Weakness, NSTEMI  History of Present Illness:  82 year old man admitted to [facility] CVICU 9/24 as a transfer from [facility] for NSTEMI and low flow state. PMHx significant for HTN, CHF (Echo 9/24 EF 20-25%), aortitis (on Augmentin indefinitely, reportedly has not taken this for months), CVA, T2DM, ESRD (on HD MWF via LUE AVF), colon CA s/p hemicolectomy (2019).   Patient was initially admitted to [facility] 9/23 with weakness x 1 week. He was found to have NSTEMI and acute HFrEF c/b low flow/low gradient AS. He was started on Heparin infusion (of note, history of vitreous hemorrhage of eyes 3 months ago; risks/benefits of heparin were apparently discussed with ophthalmology and it was felt benefit outweighed risk). Plan was for volume removal via HD given his ESRD status.  Echo 9/24 demonstrated EF 20-25% with global hypokinesis and septal dyskinesis related to LBBB, mildly dilated RV with mod reduced fx, mod to severe PAH, small pericardial effusion, AV severely thickened with TR, mod to severe TR and mod MR. 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 HD was attempted 9/24, however due to hypotension patient was only able to tolerate 2L fluid removal prior to cessation.  Decision to transfer patient to [facility] for further Cardiology input, TCTS consultation and possible initiation of CRRT initiation. PCCM consulted 9/24 for assistance with management.  Pertinent Medical History:   Past Medical History:  Diagnosis Date   Cancer (HCC) 2019   colon   Chronic kidney disease    Diabetes mellitus without complication (HCC)    diet controlled   Hemodialysis access site with arteriovenous graft (HCC)     Hypertension    Infection due to Clostridium septicum (HCC) 08/27/2018   Renal insufficiency    Pt on dialysis x 3 years and receives every Monday, Wednesday and Friday.    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 Transfer to [facility] CVICU.  Interim History / Subjective:   Code status updated   Objective:  Blood pressure 110/61, pulse 82, temperature 98.1 F (36.7 C), temperature source Oral, resp. rate 18, height 6\' 1"  (1.854 m), weight 89.1 kg, SpO2 95%.        Intake/Output Summary (Last 24 hours) at 04/11/2023 0718 Last data filed at 04/11/2023 0300 Gross per 24 hour  Intake --  Output 0 ml  Net 0 ml   Filed Weights   04/11/23 0000 04/11/23 0500  Weight: 89.1 kg 89.1 kg    Physical Examination: General: Chronically ill elderly M NAD  HEENT: NCAT pink mm  Neuro: AAOx4  CV: rr cap refill < 3 sec. +murmur  PULM: symmetrical chest expansion even unlabored WU:JWJX ndnt  Extremities: Trace edema. LUE AVF. Skin: c/d/w Labs/imaging personally reviewed:  Echo 9/24 > EF 20-25% with global HK and septal dyskinesis related to LBBB, mildly dilated RV with mod reduced fx, mod to severe PAH, small pericardial effusion, AV severely thickened with TR, mod to severe TR and mod MR. [facility] 9/24 >  NAME:  Jonathan Mcgrath, MRN:  [id number], DOB:  [birth year], LOS: 1 ADMISSION DATE:  04/01/2023, CONSULTATION DATE:  03/31/2023 REFERRING MD:  End - [facility] CHIEF COMPLAINT:  Weakness, NSTEMI  History of Present Illness:  82 year old man admitted to [facility] CVICU 9/24 as a transfer from [facility] for NSTEMI and low flow state. PMHx significant for HTN, CHF (Echo 9/24 EF 20-25%), aortitis (on Augmentin indefinitely, reportedly has not taken this for months), CVA, T2DM, ESRD (on HD MWF via LUE AVF), colon CA s/p hemicolectomy (2019).   Patient was initially admitted to [facility] 9/23 with weakness x 1 week. He was found to have NSTEMI and acute HFrEF c/b low flow/low gradient AS. He was started on Heparin infusion (of note, history of vitreous hemorrhage of eyes 3 months ago; risks/benefits of heparin were apparently discussed with ophthalmology and it was felt benefit outweighed risk). Plan was for volume removal via HD given his ESRD status.  Echo 9/24 demonstrated EF 20-25% with global hypokinesis and septal dyskinesis related to LBBB, mildly dilated RV with mod reduced fx, mod to severe PAH, small pericardial effusion, AV severely thickened with TR, mod to severe TR and mod MR. 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 HD was attempted 9/24, however due to hypotension patient was only able to tolerate 2L fluid removal prior to cessation.  Decision to transfer patient to [facility] for further Cardiology input, TCTS consultation and possible initiation of CRRT initiation. PCCM consulted 9/24 for assistance with management.  Pertinent Medical History:   Past Medical History:  Diagnosis Date   Cancer (HCC) 2019   colon   Chronic kidney disease    Diabetes mellitus without complication (HCC)    diet controlled   Hemodialysis access site with arteriovenous graft (HCC)     Hypertension    Infection due to Clostridium septicum (HCC) 08/27/2018   Renal insufficiency    Pt on dialysis x 3 years and receives every Monday, Wednesday and Friday.    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 Transfer to [facility] CVICU.  Interim History / Subjective:   Code status updated   Objective:  Blood pressure 110/61, pulse 82, temperature 98.1 F (36.7 C), temperature source Oral, resp. rate 18, height 6\' 1"  (1.854 m), weight 89.1 kg, SpO2 95%.        Intake/Output Summary (Last 24 hours) at 04/11/2023 0718 Last data filed at 04/11/2023 0300 Gross per 24 hour  Intake --  Output 0 ml  Net 0 ml   Filed Weights   04/11/23 0000 04/11/23 0500  Weight: 89.1 kg 89.1 kg    Physical Examination: General: Chronically ill elderly M NAD  HEENT: NCAT pink mm  Neuro: AAOx4  CV: rr cap refill < 3 sec. +murmur  PULM: symmetrical chest expansion even unlabored WU:JWJX ndnt  Extremities: Trace edema. LUE AVF. Skin: c/d/w Labs/imaging personally reviewed:  Echo 9/24 > EF 20-25% with global HK and septal dyskinesis related to LBBB, mildly dilated RV with mod reduced fx, mod to severe PAH, small pericardial effusion, AV severely thickened with TR, mod to severe TR and mod MR. [facility] 9/24 >

## 2023-04-11 NOTE — Progress Notes (Signed)
ANTICOAGULATION CONSULT NOTE -   Pharmacy Consult for heparin gtt Indication: chest pain/ACS  No Known Allergies  Patient Measurements: Height: 6\' 1"  (185.4 cm) Weight: 89.1 kg (196 lb 6.9 oz) IBW/kg (Calculated) : 79.9 Heparin Dosing Weight: 90.7 kg  Vital Signs: Temp: 98.6 F (37 C) (09/25 1139) Temp Source: Oral (09/25 0600) BP: 114/67 (09/25 1100) Pulse Rate: 91 (09/25 1139)  Labs: Recent Labs    04/09/23 2130 04/14/2023 0208 04/07/2023 0509 04/08/2023 1018 03/21/2023 1638 04/01/2023 1642 03/21/2023 2103 04/11/23 0101 04/11/23 0248 04/11/23 0940  HGB 10.7* 10.2*  --   --    < > 10.9* 10.7*  --  10.9*  --   HCT 33.4* 31.6*  --   --    < > 32.0* 32.8*  --  33.8*  --   PLT 85* 81*  --   --   --   --  80*  --  83*  --   APTT 33  --   --   --   --   --   --   --   --   --   LABPROT 16.4*  --   --   --   --   --  16.1*  --   --   --   INR 1.3*  --   --   --   --   --  1.3*  --   --   --   HEPARINUNFRC  --   --   --   --   --   --   --   --   --  0.10*  CREATININE 5.11* 5.69*  --   --   --   --  6.87*  --  7.05*  --   TROPONINIHS 2,621* 3,446*   < > 9,478*  --   --  16,162* 17,871*  --   --    < > = values in this interval not displayed.    Estimated Creatinine Clearance: 9.1 mL/min (A) (by C-G formula based on SCr of 7.05 mg/dL (H)).   Medical History: Past Medical History:  Diagnosis Date   Cancer (HCC) 2019   colon   Chronic kidney disease    Diabetes mellitus without complication (HCC)    diet controlled   Hemodialysis access site with arteriovenous graft (HCC)    Hypertension    Infection due to Clostridium septicum (HCC) 08/27/2018   Renal insufficiency    Pt on dialysis x 3 years and receives every Monday, Wednesday and Friday.    Assessment: 82 yo M with NSTEMI taken to cath on 9/24 and found to have extensive disease. He was transferred to [facility] for definitive management. Pharmacy consulted to start heparin infusion 8hr after sheath pull.   Hgb 10.9, Plt 83    Goal of Therapy:  Heparin level 0.3-0.7 units/ml Monitor platelets by anticoagulation protocol: Yes   Plan:  Increase heparin drip to 1300 units/hr Repeat heparin level in 8 hrs Daily heparin level and CBC.  Reece Leader, Colon Flattery, BCCP Clinical Pharmacist  04/11/2023 1:08 PM   [facility] pharmacy phone numbers are listed on [email or web address]

## 2023-04-11 NOTE — Consult Note (Signed)
[address]       [place] [address]             [phone]           Jonathan Mcgrath [facility] Medical Record #[id number] Date of Birth: [birth year]  End, Cristal Deer, MD Estell Harpin, MD  Chief Complaint:  CAD and AS  History of Present Illness:     Pt is a pleasant 82 yo male previous work up for CAD with normal LV function who was found to be suffering from inability to tolerate dialysis due to hypotension and chest pain. Pt has been feeling poorly also over this time. Was taken to [facility] ER hypotensive and work up with ECHO with Cardiomyopathy with EF 20% and Severe low flow low gradient AS, severe PHTN, Severe TR moderate MR. Pt taken to cath lab where high grade proximal LAD and OM were found and felt to need surgical evaluation. 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 Pt currently without CP and stable      Past Medical History:  Diagnosis Date   Cancer (HCC) 2019   colon   Chronic kidney disease    Diabetes mellitus without complication (HCC)    diet controlled   Hemodialysis access site with arteriovenous graft (HCC)    Hypertension    Infection due to Clostridium septicum (HCC) 08/27/2018   Renal insufficiency    Pt on dialysis x 3 years and receives every Monday, Wednesday and Friday.     Past Surgical History:  Procedure Laterality Date   AV FISTULA PLACEMENT Left 2015   arm   CATARACT EXTRACTION W/ INTRAOCULAR LENS  IMPLANT, BILATERAL Bilateral 08/2017   CENTRAL LINE INSERTION-TUNNELED N/A 04/19/2018   Procedure: CENTRAL LINE INSERTION-TUNNELED;  Surgeon: Renford Dills, MD;  Location: [facility] INVASIVE CV LAB;  Service: Cardiovascular;  Laterality: N/A;   CENTRAL VENOUS CATHETER INSERTION  07/15/2018   Procedure: Insertion Hickman Catheter;  Surgeon: Annice Needy, MD;  Location: [facility] INVASIVE CV LAB;  Service: Cardiovascular;;   COLON RESECTION N/A 05/16/2018   Procedure: LAPAROSCOPIC  COLON RESECTION;   Surgeon: Carolan Shiver, MD;  Location: [facility] ORS;  Service: General;  Laterality: N/A;   COLONOSCOPY N/A 04/18/2018   Procedure: COLONOSCOPY;  Surgeon: Pasty Spillers, MD;  Location: [facility] ENDOSCOPY;  Service: Endoscopy;  Laterality: N/A;   COLONOSCOPY WITH PROPOFOL N/A 05/22/2019   Procedure: COLONOSCOPY WITH PROPOFOL;  Surgeon: Pasty Spillers, MD;  Location: [facility] ENDOSCOPY;  Service: Endoscopy;  Laterality: N/A;   DIALYSIS/PERMA CATHETER REMOVAL N/A 06/19/2018   Procedure: DIALYSIS/PERMA CATHETER REMOVAL;  Surgeon: Annice Needy, MD;  Location: [facility] INVASIVE CV LAB;  Service: Cardiovascular;  Laterality: N/A;   DIALYSIS/PERMA CATHETER REMOVAL N/A 09/24/2018   Procedure: DIALYSIS/PERMA CATHETER REMOVAL;  Surgeon: Renford Dills, MD;  Location: [facility] INVASIVE CV LAB;  Service: Cardiovascular;  Laterality: N/A;   ESOPHAGOGASTRODUODENOSCOPY N/A 04/18/2018   Procedure: ESOPHAGOGASTRODUODENOSCOPY (EGD);  Surgeon: Pasty Spillers, MD;  Location: [facility] ENDOSCOPY;  Service: Endoscopy;  Laterality: N/A;   EYE SURGERY     PELVIC ANGIOGRAPHY  07/15/2018   Procedure: PELVIC ANGIOGRAPHY;  Surgeon: Renford Dills, MD;  Location: [facility] INVASIVE CV LAB;  Service: Cardiovascular;;   PERIPHERAL VASCULAR BALLOON ANGIOPLASTY Bilateral 07/15/2018   Procedure: PERIPHERAL VASCULAR BALLOON ANGIOPLASTY;  Surgeon: Annice Needy, MD;  Location: [facility] INVASIVE CV LAB;  Service: Cardiovascular;  Laterality: Bilateral;   PERIPHERAL VASCULAR CATHETERIZATION Left 01/12/2016  Procedure: A/V Shuntogram/Fistulagram;  Surgeon: Annice Needy, MD;  Location: [facility] INVASIVE CV LAB;  Service: Cardiovascular;  Laterality: Left;   PERIPHERAL VASCULAR CATHETERIZATION N/A 01/12/2016   Procedure: A/V Shunt Intervention;  Surgeon: Annice Needy, MD;  Location: [facility] INVASIVE CV LAB;  Service: Cardiovascular;  Laterality: N/A;   PERITONEAL CATHETER INSERTION N/A    REMOVAL OF A DIALYSIS CATHETER N/A 01/14/2015   Procedure:  REMOVAL OF A  PERITONEAL DIALYSIS CATHETER;  Surgeon: Annice Needy, MD;  Location: [facility] ORS;  Service: Vascular;  Laterality: N/A;   TEE WITHOUT CARDIOVERSION N/A 04/19/2018   Procedure: TRANSESOPHAGEAL ECHOCARDIOGRAM (TEE);  Surgeon: Dalia Heading, MD;  Location: [facility] ORS;  Service: Cardiovascular;  Laterality: N/A;    Social History   Tobacco Use  Smoking Status Never  Smokeless Tobacco Never    Social History   Substance and Sexual Activity  Alcohol Use No    Social History   Socioeconomic History   Marital status: Widowed    Spouse name: Not on file   Number of children: 4   Years of education: Not on file   Highest education level: Not on file  Occupational History   Occupation: retired  Tobacco Use   Smoking status: Never   Smokeless tobacco: Never  Vaping Use   Vaping status: Never Used  Substance and Sexual Activity   Alcohol use: No   Drug use: No   Sexual activity: Not Currently  Other Topics Concern   Not on file  Social History Narrative   Not on file   Social Determinants of Health   Financial Resource Strain: Not on file  Food Insecurity: No Food Insecurity (03/31/2023)   Hunger Vital Sign    Worried About Running Out of Food in the Last Year: Never true    Ran Out of Food in the Last Year: Never true  Transportation Needs: No Transportation Needs (03/28/2023)   PRAPARE - Administrator, Civil Service (Medical): No    Lack of Transportation (Non-Medical): No  Physical Activity: Not on file  Stress: Not on file  Social Connections: Not on file  Intimate Partner Violence: Not At Risk (04/11/2023)   Humiliation, Afraid, Rape, and Kick questionnaire    Fear of Current or Ex-Partner: No    Emotionally Abused: No    Physically Abused: No    Sexually Abused: No    No Known Allergies  Current Facility-Administered Medications  Medication Dose Route Frequency Provider Last Rate Last Admin   acetaminophen (TYLENOL) tablet 650 mg  650 mg  Oral Q4H PRN Hammock, Sheri, NP       amoxicillin-clavulanate (AUGMENTIN) 500-125 MG per tablet 1 tablet  1 tablet Oral Daily Bowser, Kaylyn Layer, NP       [START ON 04/12/2023] aspirin EC tablet 81 mg  81 mg Oral Daily Hammock, Sheri, NP       atorvastatin (LIPITOR) tablet 40 mg  40 mg Oral Daily Hammock, Sheri, NP       Chlorhexidine Gluconate Cloth 2 % PADS 6 each  6 each Topical Daily O'Neal, Ronnald Ramp, MD   6 each at 04/06/2023 1954   heparin ADULT infusion 100 units/mL (25000 units/260mL)  1,100 Units/hr Intravenous Continuous Calton Dach I, RPH 11 mL/hr at 04/11/23 0125 1,100 Units/hr at 04/11/23 0125   nitroGLYCERIN (NITROSTAT) SL tablet 0.4 mg  0.4 mg Sublingual Q5 Min x 3 PRN Hammock, Sheri, NP       ondansetron (ZOFRAN) injection 4 mg  4 mg Intravenous Q8H PRN Cloyd Stagers M, PA-C       Facility-Administered Medications Ordered in Other Encounters  Medication Dose Route Frequency Provider Last Rate Last Admin   iohexol (OMNIPAQUE) 300 MG/ML solution    PRN End, Cristal Deer, MD   50 mL at 05-03-2023 1700     Family History  Problem Relation Age of Onset   Diabetes Mother    Heart disease Father        Physical Exam: BP 110/61 (BP Location: Right Arm)   Pulse 82   Temp 98.1 F (36.7 C) (Oral)   Resp 18   Ht 6\' 1"  (1.854 m)   Wt 89.1 kg   SpO2 95%   BMI 25.92 kg/m  Lungs: decreased at bases Card: rr with soft murmur Ext: warm Neuro: intact    Diagnostic Studies & Laboratory data: I have personally reviewed the following studies and agree with the findings     Recent Radiology Findings:   CARDIAC CATHETERIZATION  Result Date: 05/03/23 Conclusions: Critical ostial LAD/LCx disease with 90-95% stenosis.  There is also moderate proximal RCA and severe RPAV disease. Severely elevated left and right heart filling pressures. Severe pulmonary hypertension. Normal Fick cardiac output/index, likely elevated by AV fistula. Patent bilateral common iliac artery stents  with mild restenosis at the ostium of both vessels.  Aorta is patent with mild luminal irregularities. Recommendations: Transfer to [facility] for cardiac surgery evaluation.  If transfer cannot be performed in the next hour or 2, recommend monitoring in the ICU at [facility]. If the patient becomes hemodynamically unstable or develops angina refractory to medical therapy, IABP placement will need to be considered. Resume heparin infusion 8 hours after right femoral artery hemostasis was achieved. Consider initiation of CVVH for fluid removal, given that the patient is anuric and soft blood pressure and severe AS limit intermittent HD. Yvonne Kendall, MD [facility]  PERIPHERAL VASCULAR CATHETERIZATION  Result Date: May 03, 2023 Conclusions: Critical ostial LAD/LCx disease with 90-95% stenosis.  There is also moderate proximal RCA and severe RPAV disease. Severely elevated left and right heart filling pressures. Severe pulmonary hypertension. Normal Fick cardiac output/index, likely elevated by AV fistula. Patent bilateral common iliac artery stents with mild restenosis at the ostium of both vessels.  Aorta is patent with mild luminal irregularities. Recommendations: Transfer to [facility] for cardiac surgery evaluation.  If transfer cannot be performed in the next hour or 2, recommend monitoring in the ICU at [facility]. If the patient becomes hemodynamically unstable or develops angina refractory to medical therapy, IABP placement will need to be considered. Resume heparin infusion 8 hours after right femoral artery hemostasis was achieved. Consider initiation of CVVH for fluid removal, given that the patient is anuric and soft blood pressure and severe AS limit intermittent HD. Yvonne Kendall, MD [facility]  ECHOCARDIOGRAM COMPLETE  Result Date: May 03, 2023    ECHOCARDIOGRAM REPORT   Patient Name:   Jonathan Mcgrath Date of Exam: 05-03-2023 Medical Rec #:  [id number]           Height:       73.0 in Accession  #:    [id number]          Weight:       200.0 lb Date of Birth:  [birth year]            BSA:          2.152 m Patient Age:    20 years  BP:           104/63 mmHg Patient Gender: M                   HR:           86 bpm. Exam Location:  [facility] Procedure: 2D Echo, Cardiac Doppler, Color Doppler and Strain Analysis Indications:     NSTEMI I21.4  History:         Patient has prior history of Echocardiogram examinations, most                  recent 07/13/2022. Risk Factors:Hypertension and Diabetes.  Sonographer:     Cristela Blue Referring Phys:  [id number] SHERI HAMMOCK Diagnosing Phys: Yvonne Kendall MD  Sonographer Comments: Global longitudinal strain was attempted. IMPRESSIONS  1. Left ventricular ejection fraction, by estimation, is 20 to 25%. The left ventricle has severely decreased function. The left ventricle demonstrates global hypokinesis. The left ventricular internal cavity size was mildly dilated. There is mild left ventricular hypertrophy. Left ventricular diastolic parameters are indeterminate. The average left ventricular global longitudinal strain is -5.3 %. The global longitudinal strain is abnormal.  2. 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 Right ventricular systolic function is moderately reduced. The right ventricular size is mildly enlarged.  3. Left atrial size was mildly dilated.  4. A small pericardial effusion is present.  5. The mitral valve is normal in structure. Moderate mitral valve regurgitation.  6. Tricuspid valve regurgitation is moderate to severe.  7. The aortic valve has an indeterminant number of cusps. There is severe calcifcation of the aortic valve. There is severe thickening of the aortic valve. Aortic valve regurgitation is trivial. There is severe low-flow/low-gradient aortic valve stenosis. Comparison(s): A prior study was performed on 07/13/2022. The left ventricular function is significantly  worse. Aortic valve stenosis has also progressed. FINDINGS  Left Ventricle: Left ventricular ejection fraction, by estimation, is 20 to 25%. The left ventricle has severely decreased function. The left ventricle demonstrates global hypokinesis. The average left ventricular global longitudinal strain is -5.3 %. The global longitudinal strain is abnormal. The left ventricular internal cavity size was mildly dilated. There is mild left ventricular hypertrophy. Abnormal (paradoxical) septal motion, consistent with left bundle branch block. Left ventricular diastolic parameters are indeterminate. Right Ventricle: There is moderate to severe pulmonary hypertension (RVSP 55-60 mmHg plus central venous/right atrial pressure). The right ventricular size is mildly enlarged. No increase in right ventricular wall thickness. Right ventricular systolic function is moderately reduced. Left Atrium: Left atrial size was mildly dilated. Right Atrium: Right atrial size was normal in size. Pericardium: A small pericardial effusion is present. Mitral Valve: The mitral valve is normal in structure. Moderate mitral valve regurgitation. Tricuspid Valve: The tricuspid valve is normal in structure. Tricuspid valve regurgitation is moderate to severe. Aortic Valve: The aortic valve has an indeterminant number of cusps. There is severe calcifcation of the aortic valve. There is severe thickening of the aortic valve. Aortic valve regurgitation is trivial. There is severe low-flow/low-gradient aortic valve stenosis. Aortic valve mean gradient measures 25.0 mmHg. Aortic valve peak gradient measures 45.3 mmHg. Aortic valve area, by VTI measures 0.55 cm. Pulmonic Valve: The pulmonic valve was not well visualized. Pulmonic valve regurgitation is trivial. No evidence of pulmonic stenosis. Aorta: The aortic root is normal in size and structure. Pulmonary Artery: The pulmonary artery is not well seen. Venous: The inferior vena cava was not  well  visualized. IAS/Shunts: The interatrial septum was not well visualized.  LEFT VENTRICLE PLAX 2D LVIDd:         5.69 cm      Diastology LVIDs:         4.94 cm      LV e' medial:    10.60 cm/s LV PW:         1.32 cm      LV E/e' medial:  10.2 LV IVS:        1.13 cm      LV e' lateral:   4.03 cm/s LVOT diam:     2.00 cm      LV E/e' lateral: 26.8 LV SV:         39 LV SV Index:   18           2D Longitudinal Strain LVOT Area:     3.14 cm     2D Strain GLS Avg:     -5.3 %  LV Volumes (MOD) LV vol d, MOD A2C: 155.0 ml LV vol d, MOD A4C: 152.0 ml LV vol s, MOD A2C: 137.0 ml LV vol s, MOD A4C: 105.0 ml LV SV MOD A2C:     18.0 ml LV SV MOD A4C:     152.0 ml LV SV MOD BP:      33.1 ml RIGHT VENTRICLE RV Basal diam:  5.00 cm RV Mid diam:    3.90 cm RV S prime:     9.14 cm/s TAPSE (M-mode): 2.1 cm LEFT ATRIUM             Index        RIGHT ATRIUM           Index LA diam:        5.00 cm 2.32 cm/m   RA Area:     17.90 cm LA Vol (A2C):   69.5 ml 32.30 ml/m  RA Volume:   51.00 ml  23.70 ml/m LA Vol (A4C):   53.3 ml 24.77 ml/m LA Biplane Vol: 66.2 ml 30.77 ml/m  AORTIC VALVE AV Area (Vmax):    0.54 cm AV Area (Vmean):   0.55 cm AV Area (VTI):     0.55 cm AV Vmax:           336.67 cm/s AV Vmean:          232.333 cm/s AV VTI:            0.697 m AV Peak Grad:      45.3 mmHg AV Mean Grad:      25.0 mmHg LVOT Vmax:         58.30 cm/s LVOT Vmean:        41.000 cm/s LVOT VTI:          0.123 m LVOT/AV VTI ratio: 0.18  AORTA Ao Root diam: 2.90 cm MITRAL VALVE                TRICUSPID VALVE MV Area (PHT): 4.31 cm     TR Peak grad:   56.2 mmHg MV Decel Time: 176 msec     TR Vmax:        375.00 cm/s MV E velocity: 108.00 cm/s                             SHUNTS  Systemic VTI:  0.12 m                             Systemic Diam: 2.00 cm Yvonne Kendall MD Electronically signed by Yvonne Kendall MD Signature Date/Time: 03/29/2023/1:12:30 PM    Final    CT Head Wo Contrast  Result Date: 04/09/2023 CLINICAL  DATA:  Headache, new onset (Age >= 51y) EXAM: CT HEAD WITHOUT CONTRAST TECHNIQUE: Contiguous axial images were obtained from the base of the skull through the vertex without intravenous contrast. RADIATION DOSE REDUCTION: This exam was performed according to the departmental dose-optimization program which includes automated exposure control, adjustment of the mA and/or kV according to patient size and/or use of iterative reconstruction technique. COMPARISON:  CT head 07/12/2022, MRI head 07/09/2022 FINDINGS: Brain: Patchy and confluent areas of decreased attenuation are noted throughout the deep and periventricular white matter of the cerebral hemispheres bilaterally, compatible with chronic microvascular ischemic disease. Chronic cerebellar lacunar infarctions again noted. No evidence of large-territorial acute infarction. No parenchymal hemorrhage. No mass lesion. No extra-axial collection. No mass effect or midline shift. No hydrocephalus. Basilar cisterns are patent. Vascular: No hyperdense vessel. Atherosclerotic calcifications are present within the cavernous internal carotid arteries. Skull: No acute fracture or focal lesion. Sinuses/Orbits: Partial right mastoid air cell effusion. Paranasal sinuses and left mastoid air cells are clear. Bilateral lens replacement. Otherwise the orbits are unremarkable. Other: None. IMPRESSION: No acute intracranial abnormality. Electronically Signed   By: Tish Frederickson M.D.   On: 04/09/2023 23:39   DG Chest Port 1 View  Result Date: 04/09/2023 CLINICAL DATA:  Questionable sepsis - evaluate for abnormality EXAM: PORTABLE CHEST 1 VIEW.  Patient is rotated. COMPARISON:  Chest x-ray 07/12/2022, CT chest 07/08/2018, chest x-ray 02/15/2021 FINDINGS: Persistent enlarged cardiac silhouette. The heart and mediastinal contours are unchanged. Aortic calcification. Patient is rotated with right hilar density likely due to vasculature positioning. No focal consolidation. Mild  pulmonary edema. No pleural effusion. No pneumothorax. No acute osseous abnormality. IMPRESSION: 1. Cardiomegaly with mild pulmonary edema. Underlying pericardial effusion not excluded 2.  Aortic Atherosclerosis (ICD10-I70.0). Electronically Signed   By: Tish Frederickson M.D.   On: 04/09/2023 23:36      Recent Lab Findings: Lab Results  Component Value Date   WBC 9.5 04/11/2023   HGB 10.9 (L) 04/11/2023   HCT 33.8 (L) 04/11/2023   PLT 83 (L) 04/11/2023   GLUCOSE 97 04/11/2023   CHOL 160 04/09/2023   TRIG 85 04/09/2023   HDL 38 (L) 04/09/2023   LDLCALC 105 (H) 04/09/2023   ALT 54 (H) 04/01/2023   AST 75 (H) 04/03/2023   NA 136 04/11/2023   K 5.5 (H) 04/11/2023   CL 97 (L) 04/11/2023   CREATININE 7.05 (H) 04/11/2023   BUN 52 (H) 04/11/2023   CO2 24 04/11/2023   TSH 1.539 04/12/2018   INR 1.3 (H) 03/22/2023   HGBA1C 5.8 (H) 04/12/2023      Assessment / Plan:     82 yo male with NYHA class 3 symptoms of severe CAD (LM equivilant) high grade Cardiomyopathy, AS, PHTN, TR, MR on chronic dialysis, PVD sp lower ext stenting. Pt is not a surgical candidate for CABG, AVR, Tvr. I have discussed this with the pt and that the structural heart team will evaluate for options for PCI/TAVR over next few days. He understands. He is to be evaluated for CRRT by nephrology currently   I have spent 60 min  in review of the records, viewing studies and in face to face with patient and in coordination of future care    Eugenio Hoes 04/11/2023 7:28 AM

## 2023-04-11 NOTE — Consult Note (Addendum)
RCA and severe RPAV disease. Severely elevated left and right heart filling pressures. Severe pulmonary hypertension. Normal Fick cardiac output/index, likely elevated by AV fistula. Patent bilateral common iliac artery stents with mild restenosis at the ostium of both vessels.  Aorta is patent with mild luminal irregularities. Recommendations: Transfer to [facility] for cardiac surgery evaluation.  If transfer cannot be performed in the next hour or 2, recommend monitoring in the ICU at [facility]. If the patient becomes hemodynamically unstable or  develops angina refractory to medical therapy, IABP placement will need to be considered. Resume heparin infusion 8 hours after right femoral artery hemostasis was achieved. Consider initiation of CVVH for fluid removal, given that the patient is anuric and soft blood pressure and severe AS limit intermittent HD. Yvonne Kendall, MD [facility]  ECHOCARDIOGRAM COMPLETE  Result Date: 04/01/2023    ECHOCARDIOGRAM REPORT   Patient Name:   RAMEEK GRASSER Date of Exam: 03/31/2023 Medical Rec #:  [id number]           Height:       73.0 in Accession #:    [id number]          Weight:       200.0 lb Date of Birth:  February 16, 1941            BSA:          2.152 m Patient Age:    82 years            BP:           104/63 mmHg Patient Gender: M                   HR:           86 bpm. Exam Location:  [facility] Procedure: 2D Echo, Cardiac Doppler, Color Doppler and Strain Analysis Indications:     NSTEMI I21.4  History:         Patient has prior history of Echocardiogram examinations, most                  recent 07/13/2022. Risk Factors:Hypertension and Diabetes.  Sonographer:     Cristela Blue Referring Phys:  [id number] SHERI HAMMOCK Diagnosing Phys: Yvonne Kendall MD  Sonographer Comments: Global longitudinal strain was attempted. IMPRESSIONS  1. Left ventricular ejection fraction, by estimation, is 20 to 25%. The left ventricle has severely decreased function. The left ventricle demonstrates global hypokinesis. The left ventricular internal cavity size was mildly dilated. There is mild left ventricular hypertrophy. Left ventricular diastolic parameters are indeterminate. The average left ventricular global longitudinal strain is -5.3 %. The global longitudinal strain is abnormal.  2. 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 Right ventricular systolic function is moderately reduced. The right ventricular size is mildly enlarged.  3. Left atrial size was mildly  dilated.  4. A small pericardial effusion is present.  5. The mitral valve is normal in structure. Moderate mitral valve regurgitation.  6. Tricuspid valve regurgitation is moderate to severe.  7. The aortic valve has an indeterminant number of cusps. There is severe calcifcation of the aortic valve. There is severe thickening of the aortic valve. Aortic valve regurgitation is trivial. There is severe low-flow/low-gradient aortic valve stenosis. Comparison(s): A prior study was performed on 07/13/2022. The left ventricular function is significantly worse. Aortic valve stenosis has also progressed. FINDINGS  Left Ventricle: Left ventricular ejection fraction, by estimation, is 20 to 25%. The  RCA and severe RPAV disease. Severely elevated left and right heart filling pressures. Severe pulmonary hypertension. Normal Fick cardiac output/index, likely elevated by AV fistula. Patent bilateral common iliac artery stents with mild restenosis at the ostium of both vessels.  Aorta is patent with mild luminal irregularities. Recommendations: Transfer to [facility] for cardiac surgery evaluation.  If transfer cannot be performed in the next hour or 2, recommend monitoring in the ICU at [facility]. If the patient becomes hemodynamically unstable or  develops angina refractory to medical therapy, IABP placement will need to be considered. Resume heparin infusion 8 hours after right femoral artery hemostasis was achieved. Consider initiation of CVVH for fluid removal, given that the patient is anuric and soft blood pressure and severe AS limit intermittent HD. Yvonne Kendall, MD [facility]  ECHOCARDIOGRAM COMPLETE  Result Date: 03/27/2023    ECHOCARDIOGRAM REPORT   Patient Name:   RAMEEK GRASSER Date of Exam: 03/25/2023 Medical Rec #:  [id number]           Height:       73.0 in Accession #:    [id number]          Weight:       200.0 lb Date of Birth:  February 16, 1941            BSA:          2.152 m Patient Age:    82 years            BP:           104/63 mmHg Patient Gender: M                   HR:           86 bpm. Exam Location:  [facility] Procedure: 2D Echo, Cardiac Doppler, Color Doppler and Strain Analysis Indications:     NSTEMI I21.4  History:         Patient has prior history of Echocardiogram examinations, most                  recent 07/13/2022. Risk Factors:Hypertension and Diabetes.  Sonographer:     Cristela Blue Referring Phys:  [id number] SHERI HAMMOCK Diagnosing Phys: Yvonne Kendall MD  Sonographer Comments: Global longitudinal strain was attempted. IMPRESSIONS  1. Left ventricular ejection fraction, by estimation, is 20 to 25%. The left ventricle has severely decreased function. The left ventricle demonstrates global hypokinesis. The left ventricular internal cavity size was mildly dilated. There is mild left ventricular hypertrophy. Left ventricular diastolic parameters are indeterminate. The average left ventricular global longitudinal strain is -5.3 %. The global longitudinal strain is abnormal.  2. 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 Right ventricular systolic function is moderately reduced. The right ventricular size is mildly enlarged.  3. Left atrial size was mildly  dilated.  4. A small pericardial effusion is present.  5. The mitral valve is normal in structure. Moderate mitral valve regurgitation.  6. Tricuspid valve regurgitation is moderate to severe.  7. The aortic valve has an indeterminant number of cusps. There is severe calcifcation of the aortic valve. There is severe thickening of the aortic valve. Aortic valve regurgitation is trivial. There is severe low-flow/low-gradient aortic valve stenosis. Comparison(s): A prior study was performed on 07/13/2022. The left ventricular function is significantly worse. Aortic valve stenosis has also progressed. FINDINGS  Left Ventricle: Left ventricular ejection fraction, by estimation, is 20 to 25%. The  RCA and severe RPAV disease. Severely elevated left and right heart filling pressures. Severe pulmonary hypertension. Normal Fick cardiac output/index, likely elevated by AV fistula. Patent bilateral common iliac artery stents with mild restenosis at the ostium of both vessels.  Aorta is patent with mild luminal irregularities. Recommendations: Transfer to [facility] for cardiac surgery evaluation.  If transfer cannot be performed in the next hour or 2, recommend monitoring in the ICU at [facility]. If the patient becomes hemodynamically unstable or  develops angina refractory to medical therapy, IABP placement will need to be considered. Resume heparin infusion 8 hours after right femoral artery hemostasis was achieved. Consider initiation of CVVH for fluid removal, given that the patient is anuric and soft blood pressure and severe AS limit intermittent HD. Yvonne Kendall, MD [facility]  ECHOCARDIOGRAM COMPLETE  Result Date: 03/27/2023    ECHOCARDIOGRAM REPORT   Patient Name:   RAMEEK GRASSER Date of Exam: 03/25/2023 Medical Rec #:  [id number]           Height:       73.0 in Accession #:    [id number]          Weight:       200.0 lb Date of Birth:  February 16, 1941            BSA:          2.152 m Patient Age:    82 years            BP:           104/63 mmHg Patient Gender: M                   HR:           86 bpm. Exam Location:  [facility] Procedure: 2D Echo, Cardiac Doppler, Color Doppler and Strain Analysis Indications:     NSTEMI I21.4  History:         Patient has prior history of Echocardiogram examinations, most                  recent 07/13/2022. Risk Factors:Hypertension and Diabetes.  Sonographer:     Cristela Blue Referring Phys:  [id number] SHERI HAMMOCK Diagnosing Phys: Yvonne Kendall MD  Sonographer Comments: Global longitudinal strain was attempted. IMPRESSIONS  1. Left ventricular ejection fraction, by estimation, is 20 to 25%. The left ventricle has severely decreased function. The left ventricle demonstrates global hypokinesis. The left ventricular internal cavity size was mildly dilated. There is mild left ventricular hypertrophy. Left ventricular diastolic parameters are indeterminate. The average left ventricular global longitudinal strain is -5.3 %. The global longitudinal strain is abnormal.  2. 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 Right ventricular systolic function is moderately reduced. The right ventricular size is mildly enlarged.  3. Left atrial size was mildly  dilated.  4. A small pericardial effusion is present.  5. The mitral valve is normal in structure. Moderate mitral valve regurgitation.  6. Tricuspid valve regurgitation is moderate to severe.  7. The aortic valve has an indeterminant number of cusps. There is severe calcifcation of the aortic valve. There is severe thickening of the aortic valve. Aortic valve regurgitation is trivial. There is severe low-flow/low-gradient aortic valve stenosis. Comparison(s): A prior study was performed on 07/13/2022. The left ventricular function is significantly worse. Aortic valve stenosis has also progressed. FINDINGS  Left Ventricle: Left ventricular ejection fraction, by estimation, is 20 to 25%. The  RCA and severe RPAV disease. Severely elevated left and right heart filling pressures. Severe pulmonary hypertension. Normal Fick cardiac output/index, likely elevated by AV fistula. Patent bilateral common iliac artery stents with mild restenosis at the ostium of both vessels.  Aorta is patent with mild luminal irregularities. Recommendations: Transfer to [facility] for cardiac surgery evaluation.  If transfer cannot be performed in the next hour or 2, recommend monitoring in the ICU at [facility]. If the patient becomes hemodynamically unstable or  develops angina refractory to medical therapy, IABP placement will need to be considered. Resume heparin infusion 8 hours after right femoral artery hemostasis was achieved. Consider initiation of CVVH for fluid removal, given that the patient is anuric and soft blood pressure and severe AS limit intermittent HD. Yvonne Kendall, MD [facility]  ECHOCARDIOGRAM COMPLETE  Result Date: 03/27/2023    ECHOCARDIOGRAM REPORT   Patient Name:   RAMEEK GRASSER Date of Exam: 03/25/2023 Medical Rec #:  [id number]           Height:       73.0 in Accession #:    [id number]          Weight:       200.0 lb Date of Birth:  February 16, 1941            BSA:          2.152 m Patient Age:    82 years            BP:           104/63 mmHg Patient Gender: M                   HR:           86 bpm. Exam Location:  [facility] Procedure: 2D Echo, Cardiac Doppler, Color Doppler and Strain Analysis Indications:     NSTEMI I21.4  History:         Patient has prior history of Echocardiogram examinations, most                  recent 07/13/2022. Risk Factors:Hypertension and Diabetes.  Sonographer:     Cristela Blue Referring Phys:  [id number] SHERI HAMMOCK Diagnosing Phys: Yvonne Kendall MD  Sonographer Comments: Global longitudinal strain was attempted. IMPRESSIONS  1. Left ventricular ejection fraction, by estimation, is 20 to 25%. The left ventricle has severely decreased function. The left ventricle demonstrates global hypokinesis. The left ventricular internal cavity size was mildly dilated. There is mild left ventricular hypertrophy. Left ventricular diastolic parameters are indeterminate. The average left ventricular global longitudinal strain is -5.3 %. The global longitudinal strain is abnormal.  2. 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 Right ventricular systolic function is moderately reduced. The right ventricular size is mildly enlarged.  3. Left atrial size was mildly  dilated.  4. A small pericardial effusion is present.  5. The mitral valve is normal in structure. Moderate mitral valve regurgitation.  6. Tricuspid valve regurgitation is moderate to severe.  7. The aortic valve has an indeterminant number of cusps. There is severe calcifcation of the aortic valve. There is severe thickening of the aortic valve. Aortic valve regurgitation is trivial. There is severe low-flow/low-gradient aortic valve stenosis. Comparison(s): A prior study was performed on 07/13/2022. The left ventricular function is significantly worse. Aortic valve stenosis has also progressed. FINDINGS  Left Ventricle: Left ventricular ejection fraction, by estimation, is 20 to 25%. The  RCA and severe RPAV disease. Severely elevated left and right heart filling pressures. Severe pulmonary hypertension. Normal Fick cardiac output/index, likely elevated by AV fistula. Patent bilateral common iliac artery stents with mild restenosis at the ostium of both vessels.  Aorta is patent with mild luminal irregularities. Recommendations: Transfer to [facility] for cardiac surgery evaluation.  If transfer cannot be performed in the next hour or 2, recommend monitoring in the ICU at [facility]. If the patient becomes hemodynamically unstable or  develops angina refractory to medical therapy, IABP placement will need to be considered. Resume heparin infusion 8 hours after right femoral artery hemostasis was achieved. Consider initiation of CVVH for fluid removal, given that the patient is anuric and soft blood pressure and severe AS limit intermittent HD. Yvonne Kendall, MD [facility]  ECHOCARDIOGRAM COMPLETE  Result Date: 03/27/2023    ECHOCARDIOGRAM REPORT   Patient Name:   RAMEEK GRASSER Date of Exam: 03/25/2023 Medical Rec #:  [id number]           Height:       73.0 in Accession #:    [id number]          Weight:       200.0 lb Date of Birth:  February 16, 1941            BSA:          2.152 m Patient Age:    82 years            BP:           104/63 mmHg Patient Gender: M                   HR:           86 bpm. Exam Location:  [facility] Procedure: 2D Echo, Cardiac Doppler, Color Doppler and Strain Analysis Indications:     NSTEMI I21.4  History:         Patient has prior history of Echocardiogram examinations, most                  recent 07/13/2022. Risk Factors:Hypertension and Diabetes.  Sonographer:     Cristela Blue Referring Phys:  [id number] SHERI HAMMOCK Diagnosing Phys: Yvonne Kendall MD  Sonographer Comments: Global longitudinal strain was attempted. IMPRESSIONS  1. Left ventricular ejection fraction, by estimation, is 20 to 25%. The left ventricle has severely decreased function. The left ventricle demonstrates global hypokinesis. The left ventricular internal cavity size was mildly dilated. There is mild left ventricular hypertrophy. Left ventricular diastolic parameters are indeterminate. The average left ventricular global longitudinal strain is -5.3 %. The global longitudinal strain is abnormal.  2. 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 Right ventricular systolic function is moderately reduced. The right ventricular size is mildly enlarged.  3. Left atrial size was mildly  dilated.  4. A small pericardial effusion is present.  5. The mitral valve is normal in structure. Moderate mitral valve regurgitation.  6. Tricuspid valve regurgitation is moderate to severe.  7. The aortic valve has an indeterminant number of cusps. There is severe calcifcation of the aortic valve. There is severe thickening of the aortic valve. Aortic valve regurgitation is trivial. There is severe low-flow/low-gradient aortic valve stenosis. Comparison(s): A prior study was performed on 07/13/2022. The left ventricular function is significantly worse. Aortic valve stenosis has also progressed. FINDINGS  Left Ventricle: Left ventricular ejection fraction, by estimation, is 20 to 25%. The  RCA and severe RPAV disease. Severely elevated left and right heart filling pressures. Severe pulmonary hypertension. Normal Fick cardiac output/index, likely elevated by AV fistula. Patent bilateral common iliac artery stents with mild restenosis at the ostium of both vessels.  Aorta is patent with mild luminal irregularities. Recommendations: Transfer to [facility] for cardiac surgery evaluation.  If transfer cannot be performed in the next hour or 2, recommend monitoring in the ICU at [facility]. If the patient becomes hemodynamically unstable or  develops angina refractory to medical therapy, IABP placement will need to be considered. Resume heparin infusion 8 hours after right femoral artery hemostasis was achieved. Consider initiation of CVVH for fluid removal, given that the patient is anuric and soft blood pressure and severe AS limit intermittent HD. Yvonne Kendall, MD [facility]  ECHOCARDIOGRAM COMPLETE  Result Date: 04/01/2023    ECHOCARDIOGRAM REPORT   Patient Name:   RAMEEK GRASSER Date of Exam: 03/31/2023 Medical Rec #:  [id number]           Height:       73.0 in Accession #:    [id number]          Weight:       200.0 lb Date of Birth:  February 16, 1941            BSA:          2.152 m Patient Age:    82 years            BP:           104/63 mmHg Patient Gender: M                   HR:           86 bpm. Exam Location:  [facility] Procedure: 2D Echo, Cardiac Doppler, Color Doppler and Strain Analysis Indications:     NSTEMI I21.4  History:         Patient has prior history of Echocardiogram examinations, most                  recent 07/13/2022. Risk Factors:Hypertension and Diabetes.  Sonographer:     Cristela Blue Referring Phys:  [id number] SHERI HAMMOCK Diagnosing Phys: Yvonne Kendall MD  Sonographer Comments: Global longitudinal strain was attempted. IMPRESSIONS  1. Left ventricular ejection fraction, by estimation, is 20 to 25%. The left ventricle has severely decreased function. The left ventricle demonstrates global hypokinesis. The left ventricular internal cavity size was mildly dilated. There is mild left ventricular hypertrophy. Left ventricular diastolic parameters are indeterminate. The average left ventricular global longitudinal strain is -5.3 %. The global longitudinal strain is abnormal.  2. 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 Right ventricular systolic function is moderately reduced. The right ventricular size is mildly enlarged.  3. Left atrial size was mildly  dilated.  4. A small pericardial effusion is present.  5. The mitral valve is normal in structure. Moderate mitral valve regurgitation.  6. Tricuspid valve regurgitation is moderate to severe.  7. The aortic valve has an indeterminant number of cusps. There is severe calcifcation of the aortic valve. There is severe thickening of the aortic valve. Aortic valve regurgitation is trivial. There is severe low-flow/low-gradient aortic valve stenosis. Comparison(s): A prior study was performed on 07/13/2022. The left ventricular function is significantly worse. Aortic valve stenosis has also progressed. FINDINGS  Left Ventricle: Left ventricular ejection fraction, by estimation, is 20 to 25%. The  RCA and severe RPAV disease. Severely elevated left and right heart filling pressures. Severe pulmonary hypertension. Normal Fick cardiac output/index, likely elevated by AV fistula. Patent bilateral common iliac artery stents with mild restenosis at the ostium of both vessels.  Aorta is patent with mild luminal irregularities. Recommendations: Transfer to [facility] for cardiac surgery evaluation.  If transfer cannot be performed in the next hour or 2, recommend monitoring in the ICU at [facility]. If the patient becomes hemodynamically unstable or  develops angina refractory to medical therapy, IABP placement will need to be considered. Resume heparin infusion 8 hours after right femoral artery hemostasis was achieved. Consider initiation of CVVH for fluid removal, given that the patient is anuric and soft blood pressure and severe AS limit intermittent HD. Yvonne Kendall, MD [facility]  ECHOCARDIOGRAM COMPLETE  Result Date: 03/27/2023    ECHOCARDIOGRAM REPORT   Patient Name:   RAMEEK GRASSER Date of Exam: 03/25/2023 Medical Rec #:  [id number]           Height:       73.0 in Accession #:    [id number]          Weight:       200.0 lb Date of Birth:  February 16, 1941            BSA:          2.152 m Patient Age:    82 years            BP:           104/63 mmHg Patient Gender: M                   HR:           86 bpm. Exam Location:  [facility] Procedure: 2D Echo, Cardiac Doppler, Color Doppler and Strain Analysis Indications:     NSTEMI I21.4  History:         Patient has prior history of Echocardiogram examinations, most                  recent 07/13/2022. Risk Factors:Hypertension and Diabetes.  Sonographer:     Cristela Blue Referring Phys:  [id number] SHERI HAMMOCK Diagnosing Phys: Yvonne Kendall MD  Sonographer Comments: Global longitudinal strain was attempted. IMPRESSIONS  1. Left ventricular ejection fraction, by estimation, is 20 to 25%. The left ventricle has severely decreased function. The left ventricle demonstrates global hypokinesis. The left ventricular internal cavity size was mildly dilated. There is mild left ventricular hypertrophy. Left ventricular diastolic parameters are indeterminate. The average left ventricular global longitudinal strain is -5.3 %. The global longitudinal strain is abnormal.  2. 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 Right ventricular systolic function is moderately reduced. The right ventricular size is mildly enlarged.  3. Left atrial size was mildly  dilated.  4. A small pericardial effusion is present.  5. The mitral valve is normal in structure. Moderate mitral valve regurgitation.  6. Tricuspid valve regurgitation is moderate to severe.  7. The aortic valve has an indeterminant number of cusps. There is severe calcifcation of the aortic valve. There is severe thickening of the aortic valve. Aortic valve regurgitation is trivial. There is severe low-flow/low-gradient aortic valve stenosis. Comparison(s): A prior study was performed on 07/13/2022. The left ventricular function is significantly worse. Aortic valve stenosis has also progressed. FINDINGS  Left Ventricle: Left ventricular ejection fraction, by estimation, is 20 to 25%. The  RCA and severe RPAV disease. Severely elevated left and right heart filling pressures. Severe pulmonary hypertension. Normal Fick cardiac output/index, likely elevated by AV fistula. Patent bilateral common iliac artery stents with mild restenosis at the ostium of both vessels.  Aorta is patent with mild luminal irregularities. Recommendations: Transfer to [facility] for cardiac surgery evaluation.  If transfer cannot be performed in the next hour or 2, recommend monitoring in the ICU at [facility]. If the patient becomes hemodynamically unstable or  develops angina refractory to medical therapy, IABP placement will need to be considered. Resume heparin infusion 8 hours after right femoral artery hemostasis was achieved. Consider initiation of CVVH for fluid removal, given that the patient is anuric and soft blood pressure and severe AS limit intermittent HD. Yvonne Kendall, MD [facility]  ECHOCARDIOGRAM COMPLETE  Result Date: 03/27/2023    ECHOCARDIOGRAM REPORT   Patient Name:   RAMEEK GRASSER Date of Exam: 03/25/2023 Medical Rec #:  [id number]           Height:       73.0 in Accession #:    [id number]          Weight:       200.0 lb Date of Birth:  February 16, 1941            BSA:          2.152 m Patient Age:    82 years            BP:           104/63 mmHg Patient Gender: M                   HR:           86 bpm. Exam Location:  [facility] Procedure: 2D Echo, Cardiac Doppler, Color Doppler and Strain Analysis Indications:     NSTEMI I21.4  History:         Patient has prior history of Echocardiogram examinations, most                  recent 07/13/2022. Risk Factors:Hypertension and Diabetes.  Sonographer:     Cristela Blue Referring Phys:  [id number] SHERI HAMMOCK Diagnosing Phys: Yvonne Kendall MD  Sonographer Comments: Global longitudinal strain was attempted. IMPRESSIONS  1. Left ventricular ejection fraction, by estimation, is 20 to 25%. The left ventricle has severely decreased function. The left ventricle demonstrates global hypokinesis. The left ventricular internal cavity size was mildly dilated. There is mild left ventricular hypertrophy. Left ventricular diastolic parameters are indeterminate. The average left ventricular global longitudinal strain is -5.3 %. The global longitudinal strain is abnormal.  2. 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 Right ventricular systolic function is moderately reduced. The right ventricular size is mildly enlarged.  3. Left atrial size was mildly  dilated.  4. A small pericardial effusion is present.  5. The mitral valve is normal in structure. Moderate mitral valve regurgitation.  6. Tricuspid valve regurgitation is moderate to severe.  7. The aortic valve has an indeterminant number of cusps. There is severe calcifcation of the aortic valve. There is severe thickening of the aortic valve. Aortic valve regurgitation is trivial. There is severe low-flow/low-gradient aortic valve stenosis. Comparison(s): A prior study was performed on 07/13/2022. The left ventricular function is significantly worse. Aortic valve stenosis has also progressed. FINDINGS  Left Ventricle: Left ventricular ejection fraction, by estimation, is 20 to 25%. The

## 2023-04-11 NOTE — Progress Notes (Signed)
Pt receives out-pt HD at [facility] on MWF 6:30 am chair time. Will assist as needed.   Olivia Canter Renal Navigator [phone]

## 2023-04-11 NOTE — Progress Notes (Signed)
04/11/23 1000  Spiritual Encounters  Type of Visit Initial  Care provided to: Pt and family  Referral source Patient request  Reason for visit Advance directives  OnCall Visit No   Chaplain has responded to the request to create advance directive.   Chaplain provided the Advance Directive packet as well as education on Advance Directives-documents an individual completes to communicate their health care directions in advance of a time when they may need them. Chaplain informed pt the documents which may be completed here in the hospital are the Living Will and Health Care Power of [place].  Chaplain informed that the Health Care Power of [place] is a legal document in which an individual names another person, their Health Care Agent, to make health care decisions when the individual is not able to make them for themselves. The Health Care Agent's function can be temporary or permanent depending on the pt's ability to make and communicate those decisions independently. Chaplain informed pt in the absence of a Health Care Power of [place], the state of [place] directs health care providers to look to the following individuals in the order listed: legal guardian; an attorney?in?fact under a general power of attorney (POA) if that POA includes the right to make health care decisions; a husband or wife; a majority of parents and adult children; a majority of adult brothers and sisters; or an individual who has an established relationship with you, who is acting in good faith and who can convey your wishes.  If none of these person are available or willing to make medical decisions on a patient's behalf, the law allows the patient's doctor to make decisions for them as long as another doctor agrees with those decisions.  Chaplain also informed the patient that the Health Care agent has no decision-making authority over any affairs other than those related to his or her medical care.  The  chaplain further educated the pt that a Living Will is a legal document that allows an individual to state his or her desire not to receive life-prolonging measures in the event that they have a condition that is incurable and will result in their death in a short period of time; they are unconscious, and doctors are confident that they will not regain consciousness; and/or they have advanced dementia or other substantial and irreversible loss of mental function. The chaplain informed pt that life-prolonging measures are medical treatments that would only serve to postpone death, including breathing machines, kidney dialysis, antibiotics, artificial nutrition and hydration (tube feeding), and similar forms of treatment and that if an individual is able to express their wishes, they may also make them known without the use of a Living Will, but in the event that an individual is not able to express their wishes themselves, a Living Will allows medical providers and the pt's family and friends ensure that they are not making decisions on the pt's behalf, but rather serving as the pt's voice to convey decisions the pt has already made.  The patient is aware that the decision to create an advance directive is theirs alone and they may chose not to complete the documents or may chose to complete one portion or both.  The patient was informed that they can revoke the documents at any time by striking through them and writing void or by completing new documents, but that it is also advisable that the individual verbally notify interested parties that their wishes have changed.  They are also aware  that the document must be signed in the presence of a notary public and two witnesses and that this can be done while the patient is still admitted to the hospital or after discharge in the community. If they decide to complete Advance Directives after being discharged from the hospital, they have been advised to notify all  interested parties and to provide those documents to their physicians and loved ones in addition to bringing them to the hospital in the event of another hospitalization.  M.Kubra Jusitn Salsgiver, MA Chaplain Intern [phone]

## 2023-04-11 NOTE — IPAL (Signed)
  Interdisciplinary Goals of Care Family Meeting   Date carried out: 04/11/2023  Location of the meeting: Bedside  Member's involved: Physician, Nurse Practitioner, Bedside Registered Nurse, and Family Member or next of kin  Durable Power of Attorney or acting medical decision maker: Pt. There is a son Jonathan Mcgrath) who is designated POA if he cannot make decisions.   Discussion: We discussed goals of care for Jonathan Mcgrath .  Discussed proposed interventions for his complex cardiovascular problems, including mechanical support. Discussed code status.   In event of arrest pt does not want to be resuscitated.  It was also discussed that if he were to go for procedure (valve, PCI, etc) code status would be reversed to Full Code for the procedure, and reverted back to DNR following. This was understood by pt and family who agree.   Code status:   Code Status: Do not attempt resuscitation (DNR) PRE-ARREST INTERVENTIONS DESIRED   Disposition: Continue current acute care  Time spent for the meeting:    Lanier Clam, NP  04/11/2023, 9:57 AM

## 2023-04-11 NOTE — Progress Notes (Signed)
Patient Name: Jonathan Mcgrath Date of Encounter: 04/11/2023 [facility] Cardiologist: Yvonne Kendall, MD   Interval Summary  .    No acute events overnight.  Denies chest pain or dyspnea.    Vital Signs .    Vitals:   04/11/23 0730 04/11/23 0756 04/11/23 0800 04/11/23 0900  BP: 104/61  (!) 106/48 112/63  Pulse:  80 82 82  Resp:  16 (!) 24 13  Temp:      TempSrc:      SpO2:  96% 98% 96%  Weight:      Height:        Intake/Output Summary (Last 24 hours) at 04/11/2023 1028 Last data filed at 04/11/2023 0900 Gross per 24 hour  Intake 83.36 ml  Output 0 ml  Net 83.36 ml      04/11/2023    5:00 AM 04/11/2023   12:00 AM 04/01/2023    3:31 PM  Last 3 Weights  Weight (lbs) 196 lb 6.9 oz 196 lb 6.9 oz 199 lb 15.3 oz  Weight (kg) 89.1 kg 89.1 kg 90.7 kg      Telemetry/ECG    SR - Personally Reviewed  Physical Exam .   GEN: No acute distress.   Neck: No JVD Cardiac: RRR wth 3/6 SEM  Respiratory: Clear to auscultation bilaterally. GI: Soft, nontender, non-distended  MS: No edema; L forearm fistula  Assessment & Plan .     Multivessel coronary artery disease.  The patient was seen by cardiothoracic surgery and turned down for CABG and AVR.  I do long talk with the patient and his sons.  At this point in time they would like to pursue intervention.  I will continue discussing this with them as this would require Impella assisted high risk PCI with possible atherectomy given the presence of severe LV dysfunction and severe aortic stenosis.  Will continue to discuss with family and perhaps obtain palliative care consult to address goals of care given his multiple comorbidities. Aortic stenosis: I think his coronary artery disease would need to be addressed prior to his aortic stenosis if we were to proceed with comprehensive interventional management of both his coronary artery disease and valvular disease.  Based on his peripheral angiography he looks to have  femoral access that can accommodate both an Impella sheath and sheath for a TAVR procedure if needed. Severe cardiomyopathy: The patient does not seem to be in cardiogenic shock at this time but does have low blood pressures.  He is warm and well-perfused.  While he does have critical LAD and left circumflex disease I believe his cardiomyopathy is likely mixed in etiology with a contribution of his valvular disease as well.  No room for goal-directed medical therapy at this time. End-stage renal disease: I discussed with pulmonary critical care and nephrology.  Conventional dialysis will be pursued for now but I am unsure whether this will be tolerated given the patient's severe LV dysfunction and aortic stenosis; if not he will require CVVHD.  His wedge pressure was 30 and his PVR is less than 3.  Removing volume will be helpful to optimize him for any procedures. 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   CRITICAL CARE Performed by: Orbie Pyo   Total critical care time: 60 minutes  Critical care time was exclusive of separately billable procedures and treating other patients.  Critical care was necessary to treat or prevent imminent or life-threatening deterioration.  Critical care was time spent personally by  me on the following activities: development of treatment plan with patient and/or surrogate as well as nursing, discussions with consultants, evaluation of patient's response to treatment, examination of patient, obtaining history from patient or surrogate, ordering and performing treatments and interventions, ordering and review of laboratory studies, ordering and review of radiographic studies, pulse oximetry and re-evaluation of patient's condition.  For questions or updates, please contact [facility] Please consult [email or web address] for contact info under        Signed, Orbie Pyo, MD

## 2023-04-11 NOTE — Progress Notes (Signed)
ANTICOAGULATION CONSULT NOTE - Initial Consult  Pharmacy Consult for heparin gtt Indication: chest pain/ACS  No Known Allergies  Patient Measurements:   Heparin Dosing Weight: 90.7 kg  Vital Signs: Temp: 97.8 F (36.6 C) (09/24 1851) Temp Source: Oral (09/24 1531) BP: 108/63 (09/24 2300) Pulse Rate: 85 (09/24 2320)  Labs: Recent Labs    04/09/23 2130 04/02/2023 0208 04/11/2023 0509 04/01/2023 0826 04/03/2023 1018 04/14/2023 1638 03/26/2023 1642 04/13/2023 2103  HGB 10.7* 10.2*  --   --   --  11.2* 10.9* 10.7*  HCT 33.4* 31.6*  --   --   --  33.0* 32.0* 32.8*  PLT 85* 81*  --   --   --   --   --  80*  APTT 33  --   --   --   --   --   --   --   LABPROT 16.4*  --   --   --   --   --   --  16.1*  INR 1.3*  --   --   --   --   --   --  1.3*  CREATININE 5.11* 5.69*  --   --   --   --   --  6.87*  TROPONINIHS 2,621* 3,446*   < > 7,083* 9,478*  --   --  16,162*   < > = values in this interval not displayed.    Estimated Creatinine Clearance: 9.4 mL/min (A) (by C-G formula based on SCr of 6.87 mg/dL (H)).   Medical History: Past Medical History:  Diagnosis Date   Cancer (HCC) 2019   colon   Chronic kidney disease    Diabetes mellitus without complication (HCC)    diet controlled   Hemodialysis access site with arteriovenous graft (HCC)    Hypertension    Infection due to Clostridium septicum (HCC) 08/27/2018   Renal insufficiency    Pt on dialysis x 3 years and receives every Monday, Wednesday and Friday.    Assessment: 82 yo M with NSTEMI taken to cath on 9/24 and found to have extensive disease. He was transferred to [facility] for definitive management. Pharmacy consulted to start heparin infusion 8hr after sheath pull.   Hgb 10.7, Plt 80   Goal of Therapy:  Heparin level 0.3-0.7 units/ml Monitor platelets by anticoagulation protocol: Yes   Plan:  Begin heparin infusion 8 hr after sheath pull ~0100 (sheath removed 1706) Start infusion at 1100 units/hr  8hr HL  Daily HL ,  CBC  F/u cardiology interventions, plans    Calton Dach, PharmD, BCCCP Clinical Pharmacist 04/11/2023 12:21 AM

## 2023-04-11 NOTE — TOC Initial Note (Signed)
Transition of Care [facility]) - Initial/Assessment Note    Patient Details  Name: Jonathan Mcgrath MRN: [id number] Date of Birth: October 31, 1940  Transition of Care [facility]) CM/SW Contact:    Jonathan Cousin, RN Phone Number: [phone] 04/11/2023, 6:09 PM  Clinical Narrative:  CM spoke to pt and states he drives to appts. Pt was independent pta. Will continue follow as pt progresses.                  Expected Discharge Plan: Home/Self Care Barriers to Discharge: Continued Medical Work up   Patient Goals and CMS Choice Patient states their goals for this hospitalization and ongoing recovery are:: wants to get better          Expected Discharge Plan and Services   Discharge Planning Services: CM Consult   Living arrangements for the past 2 months: Apartment                                      Prior Living Arrangements/Services Living arrangements for the past 2 months: Apartment Lives with:: Self Patient language and need for interpreter reviewed:: Yes Do you feel safe going back to the place where you live?: Yes      Need for Family Participation in Patient Care: No (Comment) Care giver support system in place?: Yes (comment)   Criminal Activity/Legal Involvement Pertinent to Current Situation/Hospitalization: No - Comment as needed  Activities of Daily Living   ADL Screening (condition at time of admission) Does the patient have a NEW difficulty with bathing/dressing/toileting/self-feeding that is expected to last >3 days?: No Does the patient have a NEW difficulty with getting in/out of bed, walking, or climbing stairs that is expected to last >3 days?: No Does the patient have a NEW difficulty with communication that is expected to last >3 days?: No Is the patient deaf or have difficulty hearing?: Yes Does the patient have difficulty seeing, even when wearing glasses/contacts?: No Does the patient have difficulty concentrating, remembering, or making  decisions?: No  Permission Sought/Granted Permission sought to share information with : Case Manager, Family Supports, PCP Permission granted to share information with : Yes, Verbal Permission Granted  Share Information with NAME: Jonathan Mcgrath     Permission granted to share info w Relationship: wife  Permission granted to share info w Contact Information: [phone]  Emotional Assessment Appearance:: Appears stated age Attitude/Demeanor/Rapport: Engaged Affect (typically observed): Accepting Orientation: : Oriented to Self, Oriented to Place, Oriented to  Time, Oriented to Situation   Psych Involvement: No (comment)  Admission diagnosis:  NSTEMI (non-ST elevated myocardial infarction) [facility]) [I21.4] Patient Active Problem List   Diagnosis Date Noted   DNR (do not resuscitate) 04/11/2023   Severe aortic stenosis 04/11/2023   Goals of care, counseling/discussion 04/11/2023   Acute HFrEF (heart failure with reduced ejection fraction) (HCC) 04/14/2023   NSTEMI (non-ST elevated myocardial infarction) (HCC) 04/09/2023   Elevated lactic acid level 04/09/2023   Stroke (HCC) 04/09/2023   Chronic diastolic CHF (congestive heart failure) (HCC) 04/09/2023   Acute CVA (cerebrovascular accident) (HCC) 07/12/2022   Other fracture of left lower leg, initial encounter for closed fracture 02/24/2022   Closed nondisplaced fracture of second metatarsal bone of right foot 01/16/2022   Tibial plateau fracture, right 01/16/2022   Patellar fracture 01/12/2022   Closed fracture of left olecranon process 01/12/2022   Leukocytosis 01/12/2022   Disorder of phosphorus  metabolism, unspecified 01/07/2022   Pain in left upper arm 09/06/2021   Nausea 08/26/2021   Acute respiratory disease due to COVID-19 virus 02/15/2021   Anemia in ESRD (end-stage renal disease) (HCC) 02/15/2021   ESRD (end stage renal disease) on dialysis (HCC) 02/15/2021   Elevated troponin 02/15/2021   Thrombocytopenia (HCC)  02/15/2021   Abnormality of albumin 08/28/2019   Personal history of colon cancer    Polyp of colon    Infection due to Clostridium septicum (HCC) 08/27/2018   Aortitis (HCC) 07/08/2018   Acute hypoxemic respiratory failure (HCC) 06/16/2018   Colon cancer (HCC) 05/16/2018   Adenomatous polyp of colon 05/03/2018   Malignant neoplasm of ascending colon (HCC) 05/03/2018   Benign neoplasm of ascending colon    Benign neoplasm of transverse colon    Polyp of sigmoid colon    Colon neoplasm    Mass of cecum    Diarrhea    Stomach irritation    Abdominal pain 04/12/2018   Encounter for screening for respiratory tuberculosis 09/10/2017   Anemia in chronic kidney disease 04/30/2017   Coagulation defect, unspecified (HCC) 04/30/2017   Dependence on renal dialysis (HCC) 04/30/2017   CAP (community acquired pneumonia) 08/17/2016   ESRD on peritoneal dialysis (HCC) 07/13/2014   Essential hypertension 07/13/2014   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, [place] - [address] [place] [address] Phone: [phone] Fax: [phone]  [id number], [place] - [address] [place] [address] Phone: [phone] Fax: [phone]     Social Determinants of Health (SDOH) Social History: SDOH Screenings   Food Insecurity: No Food Insecurity (28-Apr-2023)  Housing: Low Risk  (28-Apr-2023)  Transportation Needs: No Transportation Needs (04-28-23)  Utilities: Not At Risk (2023/04/28)  Tobacco Use: Low Risk  (2023-04-28)   SDOH Interventions:     Readmission Risk Interventions     No data to display

## 2023-04-12 ENCOUNTER — Inpatient Hospital Stay (HOSPITAL_COMMUNITY): Payer: Medicare HMO

## 2023-04-12 DIAGNOSIS — N186 End stage renal disease: Secondary | ICD-10-CM | POA: Diagnosis not present

## 2023-04-12 DIAGNOSIS — I214 Non-ST elevation (NSTEMI) myocardial infarction: Secondary | ICD-10-CM | POA: Diagnosis not present

## 2023-04-12 DIAGNOSIS — I350 Nonrheumatic aortic (valve) stenosis: Secondary | ICD-10-CM | POA: Diagnosis not present

## 2023-04-12 DIAGNOSIS — Z7189 Other specified counseling: Secondary | ICD-10-CM | POA: Diagnosis not present

## 2023-04-12 DIAGNOSIS — I2510 Atherosclerotic heart disease of native coronary artery without angina pectoris: Secondary | ICD-10-CM

## 2023-04-12 DIAGNOSIS — Z66 Do not resuscitate: Secondary | ICD-10-CM | POA: Diagnosis not present

## 2023-04-12 DIAGNOSIS — Z515 Encounter for palliative care: Secondary | ICD-10-CM | POA: Diagnosis not present

## 2023-04-12 DIAGNOSIS — I429 Cardiomyopathy, unspecified: Secondary | ICD-10-CM | POA: Diagnosis not present

## 2023-04-12 LAB — CBC
HCT: 31.2 % — ABNORMAL LOW (ref 39.0–52.0)
Hemoglobin: 10.3 g/dL — ABNORMAL LOW (ref 13.0–17.0)
MCH: 31.8 pg (ref 26.0–34.0)
MCHC: 33 g/dL (ref 30.0–36.0)
MCV: 96.3 fL (ref 80.0–100.0)
Platelets: 80 10*3/uL — ABNORMAL LOW (ref 150–400)
RBC: 3.24 MIL/uL — ABNORMAL LOW (ref 4.22–5.81)
RDW: 14.7 % (ref 11.5–15.5)
WBC: 10.4 10*3/uL (ref 4.0–10.5)
nRBC: 0.4 % — ABNORMAL HIGH (ref 0.0–0.2)

## 2023-04-12 LAB — POCT I-STAT 7, (LYTES, BLD GAS, ICA,H+H)
Acid-Base Excess: 3 mmol/L — ABNORMAL HIGH (ref 0.0–2.0)
Bicarbonate: 27 mmol/L (ref 20.0–28.0)
Calcium, Ion: 1.08 mmol/L — ABNORMAL LOW (ref 1.15–1.40)
HCT: 31 % — ABNORMAL LOW (ref 39.0–52.0)
Hemoglobin: 10.5 g/dL — ABNORMAL LOW (ref 13.0–17.0)
O2 Saturation: 94 %
Potassium: 4 mmol/L (ref 3.5–5.1)
Sodium: 135 mmol/L (ref 135–145)
TCO2: 28 mmol/L (ref 22–32)
pCO2 arterial: 40.3 mmHg (ref 32–48)
pH, Arterial: 7.434 (ref 7.35–7.45)
pO2, Arterial: 67 mmHg — ABNORMAL LOW (ref 83–108)

## 2023-04-12 LAB — LIPOPROTEIN A (LPA): Lipoprotein (a): 22.3 nmol/L (ref <75.0)

## 2023-04-12 LAB — CULTURE, BLOOD (SINGLE)

## 2023-04-12 LAB — GLUCOSE, CAPILLARY
Glucose-Capillary: 93 mg/dL (ref 70–99)
Glucose-Capillary: 77 mg/dL (ref 70–99)
Glucose-Capillary: 104 mg/dL — ABNORMAL HIGH (ref 70–99)
Glucose-Capillary: 126 mg/dL — ABNORMAL HIGH (ref 70–99)

## 2023-04-12 LAB — BASIC METABOLIC PANEL
Anion gap: 12 (ref 5–15)
BUN: 37 mg/dL — ABNORMAL HIGH (ref 8–23)
CO2: 27 mmol/L (ref 22–32)
Calcium: 8.3 mg/dL — ABNORMAL LOW (ref 8.9–10.3)
Chloride: 98 mmol/L (ref 98–111)
Creatinine, Ser: 5.9 mg/dL — ABNORMAL HIGH (ref 0.61–1.24)
GFR, Estimated: 9 mL/min — ABNORMAL LOW (ref >60)
Glucose, Bld: 78 mg/dL (ref 70–99)
Potassium: 4.1 mmol/L (ref 3.5–5.1)
Sodium: 137 mmol/L (ref 135–145)

## 2023-04-12 LAB — HEPARIN LEVEL (UNFRACTIONATED)
Heparin Unfractionated: 1.08 IU/mL — ABNORMAL HIGH (ref 0.30–0.70)
Heparin Unfractionated: 0.22 IU/mL — ABNORMAL LOW (ref 0.30–0.70)

## 2023-04-12 MED ORDER — PRISMASOL BGK 4/2.5 32-4-2.5 MEQ/L REPLACEMENT SOLN: Status: DC

## 2023-04-12 MED ORDER — HEPARIN (PORCINE) 2000 UNITS/L FOR CRRT: INTRAVENOUS_CENTRAL | Status: DC | PRN

## 2023-04-12 MED ORDER — AMOXICILLIN-POT CLAVULANATE 500-125 MG PO TABS
1.0000 | ORAL_TABLET | Freq: Two times a day (BID) | ORAL | Status: DC
  Administered 2023-04-12 – 2023-04-18 (×11): 1 via ORAL
  Filled 2023-04-12 (×12): qty 1

## 2023-04-12 MED ORDER — PRISMASOL BGK 4/2.5 32-4-2.5 MEQ/L EC SOLN: Status: DC

## 2023-04-12 MED ORDER — HEPARIN SODIUM (PORCINE) 1000 UNIT/ML DIALYSIS
1000.0000 [IU] | INTRAMUSCULAR | Status: DC | PRN
  Administered 2023-04-15: 4000 [IU] via INTRAVENOUS_CENTRAL
  Administered 2023-04-18: 3000 [IU] via INTRAVENOUS_CENTRAL
  Filled 2023-04-12: qty 2
  Filled 2023-04-12: qty 4
  Filled 2023-04-12: qty 6
  Filled 2023-04-12: qty 4
  Filled 2023-04-12: qty 6

## 2023-04-12 NOTE — Progress Notes (Addendum)
Patient Name: Jonathan Mcgrath Date of Encounter: 04/12/2023 [facility] Cardiologist: Yvonne Kendall, MD   Interval Summary  .    Yesterday evening, I had a long discussion with patient and one son (by phone) regarding regarding complex medical issues and complex/high risk interventions that would be required to address significant coronary artery disease as a bridge to a potential transcatheter attic valve replacement in the context of advanced age and end-stage renal disease.  Consensus opinion was to obtain palliative care consult to clarify goals of care and patient/family desires.  HD yesterday with 500cc off but remains volume up per I/O +524  This AM, BP/pulse pressure marginal.  Patient feels more dyspneic.   Expresses that he may not want complex intervention.   Vital Signs .    Vitals:   04/12/23 0100 04/12/23 0200 04/12/23 0300 04/12/23 0400  BP: (!) 95/49 (!) 85/57 95/61 95/63   Pulse: 82 80 84   Resp: (!) 6 17 17 18   Temp:      TempSrc:      SpO2: 99% 97% 95%   Weight:      Height:        Intake/Output Summary (Last 24 hours) at 04/12/2023 0718 Last data filed at 04/11/2023 2301 Gross per 24 hour  Intake 1024.21 ml  Output 500 ml  Net 524.21 ml      04/11/2023    2:15 PM 04/11/2023    5:00 AM 04/11/2023   12:00 AM  Last 3 Weights  Weight (lbs) 196 lb 6.9 oz 196 lb 6.9 oz 196 lb 6.9 oz  Weight (kg) 89.1 kg 89.1 kg 89.1 kg      Telemetry/ECG    SR - Personally Reviewed  Physical Exam .   GEN: No acute distress.   Neck: +JVD to jaw Cardiac: RRR wth 3/6 SEM  Respiratory: Clear to auscultation bilaterally. GI: Soft, nontender, non-distended  MS: No edema; L forearm fistula  Assessment & Plan .     Multivessel coronary artery disease.  I had a long discussion with the patient and 1 son yesterday evening.  At that point in time they expressed an interest to move forward with high risk Impella assisted PCI on Monday.  This morning the  patient feels significantly worse and is more volume overloaded.  He now expresses a desire to perhaps defer high risk intervention.  I discussed with critical care service.  Will obtain a palliative care consult to clarify goals of care.   Aortic stenosis: Has severe low-flow low gradient aortic stenosis which would eventually need to be addressed at some point in time and is likely contributing to hypotension. Severe cardiomyopathy: Has a low pulse pressure but is warm and well-perfused today.   End-stage renal disease: Wedge pressure on right heart catheterization was 30 with a PVR of less than 3.  The patient has had no significant volume removal with conventional dialysis.  He is volume overloaded today.  He would require CVVHD for optimization. 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  Status post bilateral iliac stenting for this in 2019. Cardiogenic shock: Patient's blood pressure and pulse pressure are quite low.  He will likely need pressor support. Respiratory distress: The patient is more dyspneic and has evidence of volume overload.  Will check an ABG today.  I have discussed with pulmonary critical care.  Will obtain a palliative care consult to clarify goals of care.  I am concerned that he will require intubation and CVVHD as  a bridge to high risk PCI.   CRITICAL CARE Performed by: Orbie Pyo   Total critical care time: 30 minutes  Critical care time was exclusive of separately billable procedures and treating other patients.  Critical care was necessary to treat or prevent imminent or life-threatening deterioration.  Critical care was time spent personally by me on the following activities: development of treatment plan with patient and/or surrogate as well as nursing, discussions with consultants, evaluation of patient's response to treatment, examination of patient, obtaining history from patient or surrogate, ordering and performing treatments and  interventions, ordering and review of laboratory studies, ordering and review of radiographic studies, pulse oximetry and re-evaluation of patient's condition.  For questions or updates, please contact [facility] Please consult [email or web address] for contact info under        Signed, Orbie Pyo, MD

## 2023-04-12 NOTE — Progress Notes (Signed)
Have spoken w family multiple times over course of the day re plan of care, GOC. Pt has oscillated in his scope of care desires. This afternoon they asked for more time to talk w other family members about how to proceed.   Was notified by RN pt has elected to proceed w offered cardiac interventions, and CRRT in interim if indicated.   I will reach out to nephro    Tessie Fass MSN, AGACNP-BC [facility] Pulmonary/Critical Care Medicine 04/12/2023, 5:07 PM

## 2023-04-12 NOTE — Procedures (Signed)
Central Venous Catheter Insertion Procedure Note  FADIL KEYSOR  [id number]  April 13, 1941  Date:04/12/23  Time:8:33 PM   Provider Performing: Tim Lair   Procedure: Insertion of Non-tunneled Central Venous Catheter(36556)with US guidance ([id number])    Indication(s): Difficult access and Hemodialysis  Consent: Risks of the procedure as well as the alternatives and risks of each were explained to the patient and/or caregiver.  Consent for the procedure was obtained and is signed in the bedside chart  Anesthesia: Topical only with 1% lidocaine   Timeout: Verified patient identification, verified procedure, site/side was marked, verified correct patient position, special equipment/implants available, medications/allergies/relevant history reviewed, required imaging and test results available.  Sterile Technique: Maximal sterile technique including full sterile barrier drape, hand hygiene, sterile gown, sterile gloves, mask, hair covering, sterile ultrasound probe cover (if used).  Procedure Description: Area of catheter insertion was cleaned with chlorhexidine and draped in sterile fashion.   With real-time ultrasound guidance a HD catheter was placed into the left internal jugular vein.  Nonpulsatile blood flow and easy flushing noted in all ports.  The catheter was sutured in place and sterile dressing applied.    Complications/Tolerance: None; patient tolerated the procedure well. Chest X-ray is ordered to verify placement for internal jugular or subclavian cannulation.  Chest x-ray is not ordered for femoral cannulation.  EBL: Minimal  Specimen(s): None  Tim Lair, [place] [facility] 04/12/23 8:34 PM  Please see [email or web address] for pager details.  From 7A-7P if no response, please call [phone] After hours, please call ELink [phone]

## 2023-04-12 NOTE — Progress Notes (Signed)
Brief Nephrology Progress Note  Notes from earlier in the day reviewed and case discussed with CCM.  Patient and family have decided to move forward with additional cardiac evaluation including catheterization with hope for TAVR after discussion with palliative care.  Patient has poorly tolerated hemodialysis yesterday.  He remains with soft blood pressures.  Plan has been for initiation of CRRT to assist with volume management if family had decided to move forward.  Will start CRRT tonight after temp HD cath placed by CCM.  All 4K baths.  Patient is on systemic heparin, no initial circuit based anticoagulation.  Initial UF 0 to 50 mL/h and uptitrate as patient tolerates.

## 2023-04-12 NOTE — Plan of Care (Signed)
  Problem: Education: Goal: Knowledge of General Education information will improve Description: Including pain rating scale, medication(s)/side effects and non-pharmacologic comfort measures Outcome: Progressing   Problem: Health Behavior/Discharge Planning: Goal: Ability to manage health-related needs will improve Outcome: Progressing   Problem: Clinical Measurements: Goal: Ability to maintain clinical measurements within normal limits will improve Outcome: Progressing Goal: Will remain free from infection Outcome: Progressing Goal: Diagnostic test results will improve Outcome: Progressing Goal: Respiratory complications will improve Outcome: Progressing Goal: Cardiovascular complication will be avoided Outcome: Progressing   Problem: Activity: Goal: Risk for activity intolerance will decrease Outcome: Progressing   Problem: Nutrition: Goal: Adequate nutrition will be maintained Outcome: Progressing   Problem: Coping: Goal: Level of anxiety will decrease Outcome: Progressing   Problem: Elimination: Goal: Will not experience complications related to bowel motility Outcome: Progressing Goal: Will not experience complications related to urinary retention Outcome: Progressing   Problem: Pain Managment: Goal: General experience of comfort will improve Outcome: Progressing   Problem: Safety: Goal: Ability to remain free from injury will improve Outcome: Progressing   Problem: Skin Integrity: Goal: Risk for impaired skin integrity will decrease Outcome: Progressing   Problem: Education: Goal: Understanding of CV disease, CV risk reduction, and recovery process will improve Outcome: Progressing Goal: Individualized Educational Video(s) Outcome: Progressing   Problem: Activity: Goal: Ability to return to baseline activity level will improve Outcome: Progressing   Problem: Cardiovascular: Goal: Ability to achieve and maintain adequate cardiovascular perfusion  will improve Outcome: Progressing Goal: Vascular access site(s) Level 0-1 will be maintained Outcome: Progressing   Problem: Health Behavior/Discharge Planning: Goal: Ability to safely manage health-related needs after discharge will improve Outcome: Progressing   Problem: Education: Goal: Knowledge of disease or condition will improve Outcome: Progressing Goal: Knowledge of secondary prevention will improve (MUST DOCUMENT ALL) Outcome: Progressing Goal: Knowledge of patient specific risk factors will improve Loraine Leriche N/A or DELETE if not current risk factor) Outcome: Progressing   Problem: Ischemic Stroke/TIA Tissue Perfusion: Goal: Complications of ischemic stroke/TIA will be minimized Outcome: Progressing   Problem: Coping: Goal: Will verbalize positive feelings about self Outcome: Progressing Goal: Will identify appropriate support needs Outcome: Progressing   Problem: Health Behavior/Discharge Planning: Goal: Ability to manage health-related needs will improve Outcome: Progressing Goal: Goals will be collaboratively established with patient/family Outcome: Progressing   Problem: Self-Care: Goal: Ability to participate in self-care as condition permits will improve Outcome: Progressing Goal: Verbalization of feelings and concerns over difficulty with self-care will improve Outcome: Progressing Goal: Ability to communicate needs accurately will improve Outcome: Progressing   Problem: Nutrition: Goal: Risk of aspiration will decrease Outcome: Progressing Goal: Dietary intake will improve Outcome: Progressing   Problem: Education: Goal: Understanding of cardiac disease, CV risk reduction, and recovery process will improve Outcome: Progressing Goal: Individualized Educational Video(s) Outcome: Progressing   Problem: Activity: Goal: Ability to tolerate increased activity will improve Outcome: Progressing   Problem: Cardiac: Goal: Ability to achieve and maintain  adequate cardiovascular perfusion will improve Outcome: Progressing   Problem: Health Behavior/Discharge Planning: Goal: Ability to safely manage health-related needs after discharge will improve Outcome: Progressing

## 2023-04-12 NOTE — Progress Notes (Signed)
[facility] PROGRESS NOTE  Assessment/ Plan: Pt is a 82 y.o. yo male  with past medical history significant for hypertension, DM,  CHF with EF of 20 to 25%, stroke, ESRD on HD MWF, colon cancer status post hemicolectomy who was transferred from [facility] for an NSTEMI, seen as a consultation for the management of ESRD.   # Acute on chronic CHF with EF of 20% CAD/NSTEMI/severe AS: The patient is currently on IV heparin.  Cardiology, PCCM is following. UF with HD.  Patient is DNR and now consulting palliative care team to address goals of care before proceeding further intervention.   # ESRD: MWF at [facility]: Status post dialysis yesterday with UF around 500 cc.  He completed treatment well except intradialytic hypotension requiring albumin, midodrine.  I agree with addressing goals of care and palliative care consult.  If desired aggressive care then the next step would be to do CRRT to optimize volume.  I do not think we can remove much fluid with conventional HD.  I have discussed with the nursing staff and patient today.     # Hypertension/volume: Issue with intradialytic hypotension.  Will try to attempt to UF with HD.   # Anemia of ESRD: Hemoglobin at goal.  No need for ESA.  Try to hold ESA in the setting of CAD.   # CKD-metabolic Bone Disease: Resumed Auryxia for hyperphosphatemia.  Subjective: Seen and examined at bedside.  Denies nausea, vomiting, chest pain or shortness of breath.  He informs me that he does not want aggressive measures and let nature take the course.  He is DNR.  His son was present at the bedside with him. Objective Vital signs in last 24 hours: Vitals:   04/12/23 0736 04/12/23 0800 04/12/23 0830 04/12/23 0900  BP:  (!) 82/49 97/60 94/65   Pulse:  86  92  Resp:  17  19  Temp: 97.6 F (36.4 C)     TempSrc: Oral     SpO2:  98%  99%  Weight:      Height:       Weight change: 0 kg  Intake/Output Summary (Last 24 hours) at 04/12/2023  1025 Last data filed at 04/12/2023 0700 Gross per 24 hour  Intake 811.14 ml  Output 500 ml  Net 311.14 ml       Labs: RENAL PANEL Recent Labs  Lab 04/09/23 2130 04/06/2023 0208 03/20/2023 1638 03/19/2023 1642 03/27/2023 2103 04/11/23 0248 04/12/23 0310 04/12/23 0753  NA 141 139   < > 137 139 136 137 135  K 4.4 5.6*   < > 5.0 5.3* 5.5* 4.1 4.0  CL 100 100  --   --  100 97* 98  --   CO2 26 28  --   --  26 24 27   --   GLUCOSE 119* 105*  --   --  84 97 78  --   BUN 35* 40*  --   --  47* 52* 37*  --   CREATININE 5.11* 5.69*  --   --  6.87* 7.05* 5.90*  --   CALCIUM 8.6* 9.0  --   --  8.7* 8.6* 8.3*  --   MG  --   --   --   --  2.0 2.1  --   --   PHOS  --   --   --   --  5.9* 6.2*  --   --   ALBUMIN 3.3*  --   --   --  3.2*  --   --   --    < > = values in this interval not displayed.    Liver Function Tests: Recent Labs  Lab 04/09/23 2130 03/19/2023 2103  AST 36 75*  ALT 33 54*  ALKPHOS 72 74  BILITOT 1.1 1.1  PROT 5.6* 5.8*  ALBUMIN 3.3* 3.2*   No results for input(s): "LIPASE", "AMYLASE" in the last 168 hours. No results for input(s): "AMMONIA" in the last 168 hours. CBC: Recent Labs    04/02/2023 1642 04/13/2023 2103 04/11/23 0248 04/12/23 0310 04/12/23 0753  HGB 10.9* 10.7* 10.9* 10.3* 10.5*  MCV  --  95.9 97.1 96.3  --     Cardiac Enzymes: No results for input(s): "CKTOTAL", "CKMB", "CKMBINDEX", "TROPONINI" in the last 168 hours. CBG: Recent Labs  Lab 03/31/2023 2154 04/11/23 1138 04/11/23 1602 04/11/23 2156 04/12/23 0603  GLUCAP 103* 93 99 93 77    Iron Studies: No results for input(s): "IRON", "TIBC", "TRANSFERRIN", "FERRITIN" in the last 72 hours. 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  There is also moderate proximal RCA and severe RPAV disease. Severely elevated left and right heart filling pressures. Severe pulmonary hypertension. Normal Fick cardiac  output/index, likely elevated by AV fistula. Patent bilateral common iliac artery stents with mild restenosis at the ostium of both vessels.  Aorta is patent with mild luminal irregularities. Recommendations: Transfer to [facility] for cardiac surgery evaluation.  If transfer cannot be performed in the next hour or 2, recommend monitoring in the ICU at [facility]. If the patient becomes hemodynamically unstable or develops angina refractory to medical therapy, IABP placement will need to be considered. Resume heparin infusion 8 hours after right femoral artery hemostasis was achieved. Consider initiation of CVVH for fluid removal, given that the patient is anuric and soft blood pressure and severe AS limit intermittent HD. 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  There is also moderate proximal RCA and severe RPAV disease. Severely elevated left and right heart filling pressures. Severe pulmonary hypertension. Normal Fick cardiac output/index, likely elevated by AV fistula. Patent bilateral common iliac artery stents with mild restenosis at the ostium of both vessels.  Aorta is patent with mild luminal irregularities. Recommendations: Transfer to [facility] for cardiac surgery evaluation.  If transfer cannot be performed in the next hour or 2, recommend monitoring in the ICU at [facility] If the patient becomes hemodynamically unstable or develops angina refractory to medical therapy, IABP placement will need to be considered. Resume heparin infusion 8 hours after right femoral artery hemostasis was achieved. Consider initiation of CVVH for fluid removal, given that the patient is anuric and soft blood pressure and severe AS limit intermittent HD. Yvonne Kendall, MD [facility]  ECHOCARDIOGRAM COMPLETE  Result Date: 04/13/2023    ECHOCARDIOGRAM REPORT   Patient Name:   Jonathan Mcgrath Date  of Exam: 04/02/2023 Medical Rec #:  [id number]           Height:       73.0 in Accession #:    [id number]          Weight:       200.0 lb Date of Birth:  1941/05/29            BSA:          2.152 m Patient Age:    23 years  BP:           104/63 mmHg Patient Gender: M                   HR:           86 bpm. Exam Location:  [facility] Procedure: 2D Echo, Cardiac Doppler, Color Doppler and Strain Analysis Indications:     NSTEMI I21.4  History:         Patient has prior history of Echocardiogram examinations, most                  recent 07/13/2022. Risk Factors:Hypertension and Diabetes.  Sonographer:     Cristela Blue Referring Phys:  [id number] SHERI HAMMOCK Diagnosing Phys: Yvonne Kendall MD  Sonographer Comments: Global longitudinal strain was attempted. IMPRESSIONS  1. Left ventricular ejection fraction, by estimation, is 20 to 25%. The left ventricle has severely decreased function. The left ventricle demonstrates global hypokinesis. The left ventricular internal cavity size was mildly dilated. There is mild left ventricular hypertrophy. Left ventricular diastolic parameters are indeterminate. The average left ventricular global longitudinal strain is -5.3 %. The global longitudinal strain is abnormal.  2. 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 Right ventricular systolic function is moderately reduced. The right ventricular size is mildly enlarged.  3. Left atrial size was mildly dilated.  4. A small pericardial effusion is present.  5. The mitral valve is normal in structure. Moderate mitral valve regurgitation.  6. Tricuspid valve regurgitation is moderate to severe.  7. The aortic valve has an indeterminant number of cusps. There is severe calcifcation of the aortic valve. There is severe thickening of the aortic valve. Aortic valve regurgitation is trivial. There is severe low-flow/low-gradient aortic valve stenosis. Comparison(s): A prior  study was performed on 07/13/2022. The left ventricular function is significantly worse. Aortic valve stenosis has also progressed. FINDINGS  Left Ventricle: Left ventricular ejection fraction, by estimation, is 20 to 25%. The left ventricle has severely decreased function. The left ventricle demonstrates global hypokinesis. The average left ventricular global longitudinal strain is -5.3 %. The global longitudinal strain is abnormal. The left ventricular internal cavity size was mildly dilated. There is mild left ventricular hypertrophy. Abnormal (paradoxical) septal motion, consistent with left bundle branch block. Left ventricular diastolic parameters are indeterminate. Right Ventricle: There is moderate to severe pulmonary hypertension (RVSP 55-60 mmHg plus central venous/right atrial pressure). The right ventricular size is mildly enlarged. No increase in right ventricular wall thickness. Right ventricular systolic function is moderately reduced. Left Atrium: Left atrial size was mildly dilated. Right Atrium: Right atrial size was normal in size. Pericardium: A small pericardial effusion is present. Mitral Valve: The mitral valve is normal in structure. Moderate mitral valve regurgitation. Tricuspid Valve: The tricuspid valve is normal in structure. Tricuspid valve regurgitation is moderate to severe. Aortic Valve: The aortic valve has an indeterminant number of cusps. There is severe calcifcation of the aortic valve. There is severe thickening of the aortic valve. Aortic valve regurgitation is trivial. There is severe low-flow/low-gradient aortic valve stenosis. Aortic valve mean gradient measures 25.0 mmHg. Aortic valve peak gradient measures 45.3 mmHg. Aortic valve area, by VTI measures 0.55 cm. Pulmonic Valve: The pulmonic valve was not well visualized. Pulmonic valve regurgitation is trivial. No evidence of pulmonic stenosis. Aorta: The aortic root is normal in size and structure. Pulmonary Artery: The  pulmonary artery is not well seen. Venous: The inferior vena cava was  not well visualized. IAS/Shunts: The interatrial septum was not well visualized.  LEFT VENTRICLE PLAX 2D LVIDd:         5.69 cm      Diastology LVIDs:         4.94 cm      LV e' medial:    10.60 cm/s LV PW:         1.32 cm      LV E/e' medial:  10.2 LV IVS:        1.13 cm      LV e' lateral:   4.03 cm/s LVOT diam:     2.00 cm      LV E/e' lateral: 26.8 LV SV:         39 LV SV Index:   18           2D Longitudinal Strain LVOT Area:     3.14 cm     2D Strain GLS Avg:     -5.3 %  LV Volumes (MOD) LV vol d, MOD A2C: 155.0 ml LV vol d, MOD A4C: 152.0 ml LV vol s, MOD A2C: 137.0 ml LV vol s, MOD A4C: 105.0 ml LV SV MOD A2C:     18.0 ml LV SV MOD A4C:     152.0 ml LV SV MOD BP:      33.1 ml RIGHT VENTRICLE RV Basal diam:  5.00 cm RV Mid diam:    3.90 cm RV S prime:     9.14 cm/s TAPSE (M-mode): 2.1 cm LEFT ATRIUM             Index        RIGHT ATRIUM           Index LA diam:        5.00 cm 2.32 cm/m   RA Area:     17.90 cm LA Vol (A2C):   69.5 ml 32.30 ml/m  RA Volume:   51.00 ml  23.70 ml/m LA Vol (A4C):   53.3 ml 24.77 ml/m LA Biplane Vol: 66.2 ml 30.77 ml/m  AORTIC VALVE AV Area (Vmax):    0.54 cm AV Area (Vmean):   0.55 cm AV Area (VTI):     0.55 cm AV Vmax:           336.67 cm/s AV Vmean:          232.333 cm/s AV VTI:            0.697 m AV Peak Grad:      45.3 mmHg AV Mean Grad:      25.0 mmHg LVOT Vmax:         58.30 cm/s LVOT Vmean:        41.000 cm/s LVOT VTI:          0.123 m LVOT/AV VTI ratio: 0.18  AORTA Ao Root diam: 2.90 cm MITRAL VALVE                TRICUSPID VALVE MV Area (PHT): 4.31 cm     TR Peak grad:   56.2 mmHg MV Decel Time: 176 msec     TR Vmax:        375.00 cm/s MV E velocity: 108.00 cm/s                             SHUNTS  Systemic VTI:  0.12 m                             Systemic Diam: 2.00 cm Yvonne Kendall MD Electronically signed by Yvonne Kendall MD Signature Date/Time:  04/02/2023/1:12:30 PM    Final     Medications: Infusions:  anticoagulant sodium citrate     heparin 1,550 Units/hr (04/12/23 0700)    Scheduled Medications:  amoxicillin-clavulanate  1 tablet Oral QHS   aspirin EC  81 mg Oral Daily   atorvastatin  40 mg Oral Daily   Chlorhexidine Gluconate Cloth  6 each Topical Daily   Chlorhexidine Gluconate Cloth  6 each Topical Q0600   ferric citrate  420 mg Oral TID WC    have reviewed scheduled and prn medications.  Physical Exam: General:NAD, comfortable Heart:RRR, s1s2 nl Lungs:clear b/l, no crackle Abdomen:soft, Non-tender, non-distended Extremities:No edema Dialysis Access: AV fistula with good thrill and bruit.  Teala Daffron Prasad Ruhan Borak 04/12/2023,10:25 AM  LOS: 2 days

## 2023-04-12 NOTE — Progress Notes (Signed)
ANTICOAGULATION CONSULT NOTE  Pharmacy Consult for heparin gtt Indication: chest pain/ACS  No Known Allergies  Patient Measurements: Height: 6\' 1"  (185.4 cm) Weight: 89.1 kg (196 lb 6.9 oz) IBW/kg (Calculated) : 79.9 Heparin Dosing Weight: 90.7 kg  Vital Signs: Temp: 98 F (36.7 C) (09/26 1202) Temp Source: Oral (09/26 1202) BP: 82/68 (09/26 1300) Pulse Rate: 79 (09/26 1300)  Labs: Recent Labs    04/09/23 2130 03/22/2023 0208 03/23/2023 1018 04/12/2023 1638 04/11/2023 2103 04/11/23 0101 04/11/23 0248 04/11/23 0940 04/11/23 2137 04/12/23 0310 04/12/23 0732 04/12/23 0753  HGB 10.7*   < >  --    < > 10.7*  --  10.9*  --   --  10.3*  --  10.5*  HCT 33.4*   < >  --    < > 32.8*  --  33.8*  --   --  31.2*  --  31.0*  PLT 85*   < >  --   --  80*  --  83*  --   --  80*  --   --   APTT 33  --   --   --   --   --   --   --   --   --   --   --   LABPROT 16.4*  --   --   --  16.1*  --   --   --   --   --   --   --   INR 1.3*  --   --   --  1.3*  --   --   --   --   --   --   --   HEPARINUNFRC  --   --   --   --   --   --   --  0.10* 0.11*  --  1.08*  --   CREATININE 5.11*   < >  --   --  6.87*  --  7.05*  --   --  5.90*  --   --   TROPONINIHS 2,621*   < > 9,478*  --  16,162* 17,871*  --   --   --   --   --   --    < > = values in this interval not displayed.    Estimated Creatinine Clearance: 10.9 mL/min (A) (by C-G formula based on SCr of 5.9 mg/dL (H)).   Medical History: Past Medical History:  Diagnosis Date   Cancer (HCC) 2019   colon   Chronic kidney disease    Diabetes mellitus without complication (HCC)    diet controlled   Hemodialysis access site with arteriovenous graft (HCC)    Hypertension    Infection due to Clostridium septicum (HCC) 08/27/2018   Renal insufficiency    Pt on dialysis x 3 years and receives every Monday, Wednesday and Friday.    Assessment: 82 yo M with NSTEMI taken to cath on 9/24 and found to have extensive disease. He was transferred to [facility]  for definitive management. Pharmacy consulted to start heparin infusion 8hr after sheath pull.   Morning heparin level was 1.08, heparin lab drawn from same arm as heparin infusion. Likely lab error.  Repeat level returned at 0.22 and is subtherapeutic with heparin running at 1550 units/hr. Hgb (10.5) and PLTs (80) are stable. No signs of bleeding.   Goal of Therapy:  Heparin level 0.3-0.7 units/ml Monitor platelets by anticoagulation protocol: Yes   Plan:  Increase heparin drip  to 1650 units/hr Repeat heparin level in 8 hrs Monitor daily heparin level and CBC Monitor signs and symptoms of bleeding  Romie Minus, PharmD PGY1 Pharmacy Resident  Please check AMION for all [facility] Pharmacy phone numbers After 10:00 PM, call Main Pharmacy [phone]  04/12/2023 3:31 PM

## 2023-04-12 NOTE — Progress Notes (Signed)
NAME:  Jonathan Mcgrath, MRN:  [id number], DOB:  [birth year], LOS: 2 ADMISSION DATE:  03/27/2023, CONSULTATION DATE:  04/04/2023 REFERRING MD:  End - [facility] CHIEF COMPLAINT:  Weakness, NSTEMI  History of Present Illness:  82 year old man admitted to [facility] CVICU 9/24 as a transfer from [facility] for NSTEMI and low flow state. PMHx significant for HTN, CHF (Echo 9/24 EF 20-25%), aortitis (on Augmentin indefinitely, reportedly has not taken this for months), CVA, T2DM, ESRD (on HD MWF via LUE AVF), colon CA s/p hemicolectomy (2019).   Patient was initially admitted to [facility] 9/23 with weakness x 1 week. He was found to have NSTEMI and acute HFrEF c/b low flow/low gradient AS. He was started on Heparin infusion (of note, history of vitreous hemorrhage of eyes 3 months ago; risks/benefits of heparin were apparently discussed with ophthalmology and it was felt benefit outweighed risk). Plan was for volume removal via HD given his ESRD status.  Echo 9/24 demonstrated EF 20-25% with global hypokinesis and septal dyskinesis related to LBBB, mildly dilated RV with mod reduced fx, mod to severe PAH, small pericardial effusion, AV severely thickened with TR, mod to severe TR and mod MR. Patient was taken for [facility] 9/24 which demonstrated critical ostial LAD/LCx disease with 90-95%, mod proximal RCA and severe RPAV disease, severely elevated L and R filling pressures, severe PAH. HD was attempted 9/24, however due to hypotension patient was only able to tolerate 2L fluid removal prior to cessation.  Decision to transfer patient to [facility] for further Cardiology input, TCTS consultation and possible initiation of CRRT initiation. PCCM consulted 9/24 for assistance with management.  Pertinent Medical History:   Past Medical History:  Diagnosis Date   Cancer (HCC) 2019   colon   Chronic kidney disease    Diabetes mellitus without complication (HCC)    diet controlled   Hemodialysis access site with arteriovenous graft (HCC)     Hypertension    Infection due to Clostridium septicum (HCC) 08/27/2018   Renal insufficiency    Pt on dialysis x 3 years and receives every Monday, Wednesday and Friday.    Significant Hospital Events: Including procedures, antibiotic start and stop dates in addition to other pertinent events   9/23 Admit to [facility] 9/24 L/RHC with findings of critical ostial LAD/LCx disease with 90-95%, mod proximal RCA and severe RPAV disease, severely elevated L and R filling pressures, severe PAH. Transfer to [facility] CVICU. 9/25 DNR, aggressive medical care desired. iHD trial 9/26 palliative consult   Interim History / Subjective:  Did ok w iHD hemodynamically but not able to get much fluid off. Did get some albumin  NAEO  This morning some subjective SOB    Objective:  Blood pressure 94/65, pulse 92, temperature 97.6 F (36.4 C), temperature source Oral, resp. rate 19, height 6\' 1"  (1.854 m), weight 89.1 kg, SpO2 99%.        Intake/Output Summary (Last 24 hours) at 04/12/2023 1031 Last data filed at 04/12/2023 0700 Gross per 24 hour  Intake 811.14 ml  Output 500 ml  Net 311.14 ml   Filed Weights   04/11/23 0000 04/11/23 0500 04/11/23 1415  Weight: 89.1 kg 89.1 kg 89.1 kg    Physical Examination: General: Chronically ill elderly M NAD  HEENT: NCAT pink mm anicteric sclera  Neuro: AAOx3  CV: + JVD. + murmur. Cap refill < 3 sec  PULM: Even unlabored respirations. Basilar crackles. No accessory muscle use  PP:IRJJ  Extremities: LUE AVF  Skin: c/d/w  Labs/imaging personally reviewed:  Echo 9/24 > EF 20-25% with global HK and septal dyskinesis related to LBBB, mildly dilated RV with mod reduced fx, mod to severe PAH, small pericardial effusion, AV severely thickened with TR, mod to severe TR and mod MR. [facility] 9/24 > critical ostial LAD/LCx disease with 90-95%, mod proximal RCA and severe RPAV disease, severely elevated L and R filling pressures, severe PAH  Assessment & Plan:     mvCAD Severe AS Mod-sev TR Mod MR  NSTEMI AoC combined systolic and diastolic HF Severe PAH  Hx HTN  P - Cardiology primary-- are assessing for possible PCI/TAVR, MCS  -CVTS has seen, not a surgical candidate for CABG AVR TVR  -hep gtt -GDMT limited by soft pressures  -midodrine deferred with sev AS   Acute respiratory failure with hypoxia  -pulm hygiene -IS -may need CRRT for volume removal if aggressive care is continued   ESRD MWF HD Hypocalcemia  P -neprho is consulted, at present MWF HD but werent able to pull much volume 9/25. 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 - Monitor while on heparin gtt  DNR status Goals of care -see IPAL 9/25 re code status, wants to be DNR in event of arrest. Reversal for procedure discussed and is understood. 9/25 he was clear that he was at peace if his life is coming to a close. 9/26 was inconsistent with his GOC wishes. If we were to cont aggressive care, likely CRRT to optimize fr high risk PCI, TAVR-- palliative care is being consulted so we can get everybody on the same page and determine course of care  -has sons and a daughter. Son Shon Hale is POA if pt unable to make decisions   Best practice (evaluated daily):  Per Primary Team  Labs   CBC: Recent Labs  Lab 04/09/23 2130 03/25/2023 0208 03/22/2023 1638 03/20/2023 1642 03/26/2023 2103 04/11/23 0248 04/12/23 0310 04/12/23 0753  WBC 8.7 7.7  --   --   8.4 9.5 10.4  --   HGB 10.7* 10.2*   < > 10.9* 10.7* 10.9* 10.3* 10.5*  HCT 33.4* 31.6*   < > 32.0* 32.8* 33.8* 31.2* 31.0*  MCV 98.2 98.1  --   --  95.9 97.1 96.3  --   PLT 85* 81*  --   --  80* 83* 80*  --    < > = values in this interval not displayed.   Basic Metabolic Panel: Recent Labs  Lab 04/09/23 2130 04/03/2023 0208 04/05/2023 1638 03/27/2023 1642 03/20/2023 2103 04/11/23 0248 04/12/23 0310 04/12/23 0753  NA 141 139   < > 137 139 136 137 135  K 4.4 5.6*   < > 5.0 5.3* 5.5* 4.1 4.0  CL 100 100  --   --  100 97* 98  --   CO2 26 28  --   --  26 24 27   --   GLUCOSE 119* 105*  --   --  84  97 78  --   BUN 35* 40*  --   --  47* 52* 37*  --   CREATININE 5.11* 5.69*  --   --  6.87* 7.05* 5.90*  --   CALCIUM 8.6* 9.0  --   --  8.7* 8.6* 8.3*  --   MG  --   --   --   --  2.0 2.1  --   --   PHOS  --   --   --   --  5.9* 6.2*  --   --    < > = values in this interval not displayed.   GFR: Estimated Creatinine Clearance: 10.9 mL/min (A) (by C-G formula based on SCr of 5.9 mg/dL (H)). Recent Labs  Lab 04/09/23 2130 04/11/2023 0002 04/06/2023 0208 03/20/2023 1638 04/12/2023 2103 04/11/23 0248 04/12/23 0310  WBC 8.7  --  7.7  --  8.4 9.5 10.4  LATICACIDVEN 2.2* 1.2  --  1.3  --   --   --    Liver Function Tests: Recent Labs  Lab 04/09/23 2130 04/02/2023 2103  AST 36 75*  ALT 33 54*  ALKPHOS 72 74  BILITOT 1.1 1.1  PROT 5.6* 5.8*  ALBUMIN 3.3* 3.2*   No results for input(s): "LIPASE", "AMYLASE" in the last 168 hours. No results for input(s): "AMMONIA" in the last 168 hours.  ABG:    Component Value Date/Time   PHART 7.434 04/12/2023 0753   PCO2ART 40.3 04/12/2023 0753   PO2ART 67 (L) 04/12/2023 0753   HCO3 27.0 04/12/2023 0753   TCO2 28 04/12/2023 0753   O2SAT 94 04/12/2023 0753    Coagulation Profile: Recent Labs  Lab 04/09/23 2130 03/26/2023 2103  INR 1.3* 1.3*   Cardiac Enzymes: No results for input(s): "CKTOTAL", "CKMB", "CKMBINDEX", "TROPONINI" in the last 168  hours.  HbA1C: Hemoglobin A1C  Date/Time Value Ref Range Status  06/04/2013 05:45 AM 5.9 4.2 - 6.3 % Final    Comment:    The American Diabetes Association recommends that a primary goal of therapy should be <7% and that physicians should reevaluate the treatment regimen in patients with HbA1c values consistently >8%.   01/27/2013 11:42 AM 7.5 (H) 4.2 - 6.3 % Final    Comment:    The American Diabetes Association recommends that a primary goal of therapy should be <7% and that physicians should reevaluate the treatment regimen in patients with HbA1c values consistently >8%.    Hgb A1c MFr Bld  Date/Time Value Ref Range Status  04/06/2023 02:08 AM 5.8 (H) 4.8 - 5.6 % Final    Comment:    (NOTE) Pre diabetes:          5.7%-6.4%  Diabetes:              >6.4%  Glycemic control for   <7.0% adults with diabetes   07/12/2022 11:58 PM 5.8 (H) 4.8 - 5.6 % Final    Comment:    (NOTE)         Prediabetes: 5.7 - 6.4         Diabetes: >6.4         Glycemic control for adults with diabetes: <7.0    CBG: Recent Labs  Lab 03/25/2023 2154 04/11/23 1138 04/11/23 1602 04/11/23 2156 04/12/23 0603  GLUCAP 103* 93 99 93 77   CRITICAL CARE Performed by: Lanier Clam  CCT na High MDM   Tessie Fass MSN, AGACNP-BC [facility] Amion for pager  04/12/2023,  10:31 AM

## 2023-04-12 NOTE — Consult Note (Signed)
Palliative Care Consult Note                                  Date: 04/12/2023   Patient Name: Jonathan Mcgrath  DOB: Jan 16, 1941  MRN: [id number]  Age / Sex: 82 y.o., male  PCP: Jonathan Harpin, MD Referring Physician: Orbie Pyo, MD  Reason for Consultation: Establishing goals of care  HPI/Patient Profile: 82 y.o. 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 He was admitted to [facility] CVICU 9/24 as a transfer from [facility]  on 04/15/2023 with NSTEMI and low flow state.   Past Medical History:  Diagnosis Date   Cancer (HCC) 2019   colon   Chronic kidney disease    Diabetes mellitus without complication (HCC)    diet controlled   Hemodialysis access site with arteriovenous graft (HCC)    Hypertension    Infection due to Clostridium septicum (HCC) 08/27/2018   Renal insufficiency    Pt on dialysis x 3 years and receives every Monday, Wednesday and Friday.     Subjective:   This NP Jonathan Mcgrath reviewed medical records, received report from team, assessed the patient and then meet at the patient's bedside to discuss diagnosis, prognosis, GOC, EOL wishes disposition and options.  I met with the patient at the bedside with sons Jonathan Mcgrath and Jonathan Mcgrath.  Jonathan Fass, NP was also present and participated in the discussion.   We meet to discuss diagnosis prognosis, GOC, EOL wishes, disposition and options. Concept of Palliative Care was introduced as specialized medical care for people and their families living with serious illness.  If focuses on providing relief from the symptoms and stress of a serious illness.  The goal is to improve quality of life for both the patient and the family. Values and goals of care important to patient and family were attempted to be  elicited.  Created space and opportunity for patient  and family to explore thoughts and feelings regarding current medical situation   Natural trajectory and current clinical status were discussed. Questions and concerns addressed. Patient  encouraged to call with questions or concerns.    Patient/Family Understanding of Illness: They understand that he has blockages in his heart but he is weak.  They have offered things they can do that may or may not work, although he has faith that it would work.  We spent extensive time explaining in detail the complicated clinical situation he is in.  I feel that the patient and family both understand his situation after explanation.  Life Review: He was born in [facility] but raised in [place].  He was married for 53 years but his wife passed in 2016.  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  He drove for 55 years and in that time did not have any accidents or any tickets.  He enjoys hunting and fishing, especially fishing for catfish.  He hunts with a gun and has also done rabbit hunting.  He generally eats what he wants.  He also enjoys traveling and his favorite destination was [facility].  He is [place] and practices in the [place] faith.  He has sons Jonathan Mcgrath and Jonathan Mcgrath as well as a daughter Jonathan Mcgrath.  Patient Values: He values Faith, family, and minimal pain if possible.  Goals: To get better if possible and minimize pain in the process.   Today's Discussion: In addition to discussions described above we had extensive discussion of various topics.  In addition to explaining, in detail, his clinical situations including severe aortic stenosis, pulmonary hypertension, acute on chronic CHF, NSTEMI with blockages we discussed the possible options.  Aggressive interventions are possibly Impella assisted PCI, possible TAVR.  He is not a surgical candidate and goal-directed medical therapy is not possible due to his soft  pressures.  In addition he has end-stage renal disease and while he tolerated outpatient dialysis "okay" he is not tolerating well here.  Prior to any aggressive interventions he would need to be tuned up with CRRT to try to reduce the fluid overload.  We made it clear that these interventions are really a "[facility]" attempt.  We discussed the chance that it could work for the chance that it could not work.  We discussed what life would look like and both these eventualities including the possibility that, even if a technical success, he may not have a improved quality of life.  At the end of our discussions the patient expressed a wish to attempt these aggressive interventions.  He understands that they are very risky and there is no maturity of success.  The patient's sons expressed their desire for him to be in his minimal pain as possible.  I encouraged him to continue to discuss amongst himself's as no decision that is made cannot be made.  I also discussed that they talk about what he would like them to do in the event that things do not go well.  For example, if he came out of the procedures on a ventilator and there was difficulty removing it.  Would he want prolonged intubation and/or trach, would he want a feeding tube if he could not eat or drink for himself, etc.  I offered to palliative medicine will continue to support them in her decision making.  I expressed that we would support decisions they made and do our best to facilitate conversations and goals discussions between them and the team.  I provided emotional and general support through therapeutic listening, empathy, sharing of stories, and other techniques. I answered all questions and addressed all concerns to the best of my ability.  Review of Systems  Constitutional:  Positive for fatigue.  Respiratory:  Negative for cough and shortness of breath.   Cardiovascular:  Negative for chest pain.  Gastrointestinal:  Negative for  abdominal pain, nausea and vomiting.  Neurological:  Positive for weakness.    Objective:   Primary Diagnoses: Present on Admission:  NSTEMI (non-ST elevated myocardial infarction) [facility])   Physical Exam Vitals and nursing note reviewed.  Constitutional:      General: He is not in acute distress.    Appearance: He is not toxic-appearing.  HENT:     Head: Normocephalic and atraumatic.  Cardiovascular:     Rate and Rhythm: Normal rate.  Pulmonary:     Effort: Pulmonary effort is normal. No respiratory distress.  Abdominal:     General: Abdomen is flat.     Palpations: Abdomen is soft.  Skin:    General: Skin is warm and dry.  Neurological:     General: No focal deficit present.     Mental Status: He is alert.  Psychiatric:        Mood and Affect: Mood  normal.        Behavior: Behavior normal.     Vital Signs:  BP (!) 87/57 (BP Location: Right Arm)   Pulse 85   Temp 97.6 F (36.4 C) (Oral)   Resp 17   Ht 6\' 1"  (1.854 m)   Wt 89.1 kg   SpO2 91%   BMI 25.92 kg/m   Palliative Assessment/Data: 50-60%    Advanced Care Planning:   Existing Vynca/ACP Documentation: HCPOA Signed 04/12/2023  Primary Decision Maker: PATIENT  Code Status/Advance Care Planning: DNR-Interventions desired  A discussion was had today regarding advanced directives. Concepts specific to code status, artifical feeding and hydration, continued IV antibiotics and rehospitalization was had.  The difference between a aggressive medical intervention path and a palliative comfort care path for this patient at this time was had.   Decisions/Changes to ACP: None today  Assessment & Plan:   Impression: 82 year old male with chronic comorbidities and acute presentations as described above.  The patient is currently in a very difficult situation needing to decide on whether he would like to proceed with "last ditch effort" risky interventions.  They understand that they are high risk and no  guarantee, but they also understand without them that he is likely facing end-of-life.  At this time the patient has decided to proceed with these interventions.  He has completed HCPOA paperwork and is designated his son Jonathan Mcgrath is power of attorney.  They understand the need to have further discussions amongst themselves for specific decisions on what the patient would wish should he be in a poor clinical state and unable to express his own wishes.  SUMMARY OF RECOMMENDATIONS   Remain DNR-interventions desired They understand that this would be revoked during procedures and reinstated post procedure Encourage communication amongst family to discern specific wishes including but not limited to vent/trach, feeding tube, etc. Spiritual care consult offered and accepted, will be entered Palliative medicine will continue to follow and support  Symptom Management:  Per primary team PMT is available to assist as needed  Prognosis:  Unable to determine  Discharge Planning:  To Be Determined   Discussed with: Patient, patient's family, medical team, nursing team    Thank you for allowing [place] to participate in the care of BENTLIE MANSKER PMT will continue to support holistically.  Time Total: 105 min (75 + 30 ACP)  Detailed review of medical records (labs, imaging, vital signs), medically appropriate exam, discussed with treatment team, counseling and education to patient, family, & staff, documenting clinical information, medication management, coordination of care  Signed by: Jonathan Dust, NP Palliative Medicine Team  Team Phone # [phone] (Nights/Weekends)  04/12/2023, 10:53 AM

## 2023-04-12 NOTE — Progress Notes (Signed)
   04/12/23 1328  Spiritual Encounters  Type of Visit Follow up  Care provided to: Pt and family  Reason for visit Advance directives  OnCall Visit No   Chaplain responded to call from RN regarding ACD ready for notary.  Chaplain checked document for readiness and then facilitated obtaining a notary and 2 witnesses to complete the document.  Once document was complete, chaplain uploaded copy for digital chart, and made copies for Pt and his proxy. No further chaplain services needed at this time.  Chaplain services remain available by Spiritual Consult or for emergent cases, paging [phone]  Chaplain Raelene Bott, MDiv [email or web address] [phone]

## 2023-04-13 DIAGNOSIS — Z515 Encounter for palliative care: Secondary | ICD-10-CM | POA: Diagnosis not present

## 2023-04-13 DIAGNOSIS — N186 End stage renal disease: Secondary | ICD-10-CM | POA: Diagnosis not present

## 2023-04-13 DIAGNOSIS — I214 Non-ST elevation (NSTEMI) myocardial infarction: Secondary | ICD-10-CM | POA: Diagnosis not present

## 2023-04-13 DIAGNOSIS — Z7189 Other specified counseling: Secondary | ICD-10-CM | POA: Diagnosis not present

## 2023-04-13 DIAGNOSIS — I5020 Unspecified systolic (congestive) heart failure: Secondary | ICD-10-CM

## 2023-04-13 DIAGNOSIS — I350 Nonrheumatic aortic (valve) stenosis: Secondary | ICD-10-CM | POA: Diagnosis not present

## 2023-04-13 DIAGNOSIS — I429 Cardiomyopathy, unspecified: Secondary | ICD-10-CM | POA: Diagnosis not present

## 2023-04-13 DIAGNOSIS — Z66 Do not resuscitate: Secondary | ICD-10-CM | POA: Diagnosis not present

## 2023-04-13 LAB — RENAL FUNCTION PANEL
Albumin: 3 g/dL — ABNORMAL LOW (ref 3.5–5.0)
Albumin: 3.1 g/dL — ABNORMAL LOW (ref 3.5–5.0)
Anion gap: 10 (ref 5–15)
Anion gap: 10 (ref 5–15)
BUN: 41 mg/dL — ABNORMAL HIGH (ref 8–23)
BUN: 31 mg/dL — ABNORMAL HIGH (ref 8–23)
CO2: 27 mmol/L (ref 22–32)
CO2: 25 mmol/L (ref 22–32)
Calcium: 8.2 mg/dL — ABNORMAL LOW (ref 8.9–10.3)
Calcium: 8.5 mg/dL — ABNORMAL LOW (ref 8.9–10.3)
Chloride: 98 mmol/L (ref 98–111)
Chloride: 100 mmol/L (ref 98–111)
Creatinine, Ser: 5.97 mg/dL — ABNORMAL HIGH (ref 0.61–1.24)
Creatinine, Ser: 4.28 mg/dL — ABNORMAL HIGH (ref 0.61–1.24)
GFR, Estimated: 9 mL/min — ABNORMAL LOW (ref >60)
GFR, Estimated: 13 mL/min — ABNORMAL LOW (ref >60)
Glucose, Bld: 91 mg/dL (ref 70–99)
Glucose, Bld: 141 mg/dL — ABNORMAL HIGH (ref 70–99)
Phosphorus: 4 mg/dL (ref 2.5–4.6)
Phosphorus: 3 mg/dL (ref 2.5–4.6)
Potassium: 4.1 mmol/L (ref 3.5–5.1)
Potassium: 4.6 mmol/L (ref 3.5–5.1)
Sodium: 135 mmol/L (ref 135–145)
Sodium: 135 mmol/L (ref 135–145)

## 2023-04-13 LAB — HEPATIC FUNCTION PANEL
ALT: 35 U/L (ref 0–44)
AST: 28 U/L (ref 15–41)
Albumin: 3 g/dL — ABNORMAL LOW (ref 3.5–5.0)
Alkaline Phosphatase: 61 U/L (ref 38–126)
Bilirubin, Direct: 0.1 mg/dL (ref 0.0–0.2)
Indirect Bilirubin: 0.7 mg/dL (ref 0.3–0.9)
Total Bilirubin: 0.8 mg/dL (ref 0.3–1.2)
Total Protein: 5.4 g/dL — ABNORMAL LOW (ref 6.5–8.1)

## 2023-04-13 LAB — CBC
HCT: 29.4 % — ABNORMAL LOW (ref 39.0–52.0)
Hemoglobin: 9.6 g/dL — ABNORMAL LOW (ref 13.0–17.0)
MCH: 31.3 pg (ref 26.0–34.0)
MCHC: 32.7 g/dL (ref 30.0–36.0)
MCV: 95.8 fL (ref 80.0–100.0)
Platelets: 86 10*3/uL — ABNORMAL LOW (ref 150–400)
RBC: 3.07 MIL/uL — ABNORMAL LOW (ref 4.22–5.81)
RDW: 14.8 % (ref 11.5–15.5)
WBC: 8.8 10*3/uL (ref 4.0–10.5)
nRBC: 0.9 % — ABNORMAL HIGH (ref 0.0–0.2)

## 2023-04-13 LAB — MAGNESIUM
Magnesium: 1.8 mg/dL (ref 1.7–2.4)
Magnesium: 2.2 mg/dL (ref 1.7–2.4)

## 2023-04-13 LAB — HEPARIN LEVEL (UNFRACTIONATED)
Heparin Unfractionated: >1.1 [IU]/mL — ABNORMAL HIGH (ref 0.30–0.70)
Heparin Unfractionated: 0.32 [IU]/mL (ref 0.30–0.70)
Heparin Unfractionated: 0.34 [IU]/mL (ref 0.30–0.70)

## 2023-04-13 MED ORDER — SODIUM CHLORIDE 0.9 % IV SOLN: INTRAVENOUS | Status: DC | PRN

## 2023-04-13 MED ORDER — LIDOCAINE-EPINEPHRINE (PF) 2 %-1:200000 IJ SOLN
INTRAMUSCULAR | Status: AC
  Filled 2023-04-13: qty 20

## 2023-04-13 MED ORDER — LIDOCAINE HCL (PF) 1 % IJ SOLN
INTRAMUSCULAR | Status: AC
  Filled 2023-04-13: qty 5

## 2023-04-13 MED ORDER — RENA-VITE PO TABS
1.0000 | ORAL_TABLET | Freq: Two times a day (BID) | ORAL | Status: DC
  Administered 2023-04-13 – 2023-04-17 (×7): 1
  Filled 2023-04-13 (×7): qty 1

## 2023-04-13 MED ORDER — HEPARIN BOLUS VIA INFUSION
2000.0000 [IU] | Freq: Once | INTRAVENOUS | Status: DC
  Filled 2023-04-13: qty 2000

## 2023-04-13 MED ORDER — LIDOCAINE HCL (PF) 1 % IJ SOLN
INTRAMUSCULAR | Status: AC
  Administered 2023-04-13: 5 mL via INTRADERMAL
  Filled 2023-04-13: qty 5

## 2023-04-13 MED ORDER — ATORVASTATIN CALCIUM 80 MG PO TABS
80.0000 mg | ORAL_TABLET | Freq: Every day | ORAL | Status: DC
  Administered 2023-04-14 – 2023-04-17 (×4): 80 mg via ORAL
  Filled 2023-04-13 (×4): qty 1

## 2023-04-13 MED ORDER — NOREPINEPHRINE 4 MG/250ML-% IV SOLN
0.0000 ug/min | INTRAVENOUS | Status: DC
  Administered 2023-04-13: 9 ug/min via INTRAVENOUS
  Administered 2023-04-13: 2 ug/min via INTRAVENOUS
  Filled 2023-04-13 (×2): qty 250

## 2023-04-13 MED ORDER — NOREPINEPHRINE 16 MG/250ML-% IV SOLN
0.0000 ug/min | INTRAVENOUS | Status: DC
  Administered 2023-04-13 – 2023-04-14 (×2): 9 ug/min via INTRAVENOUS
  Administered 2023-04-16: 11 ug/min via INTRAVENOUS
  Administered 2023-04-16: 5 ug/min via INTRAVENOUS
  Administered 2023-04-18: 34 ug/min via INTRAVENOUS
  Filled 2023-04-13 (×5): qty 250

## 2023-04-13 NOTE — Progress Notes (Signed)
ANTICOAGULATION CONSULT NOTE  Pharmacy Consult for heparin gtt Indication: chest pain/ACS  No Known Allergies  Patient Measurements: Height: 6\' 1"  (185.4 cm) Weight: 91.5 kg (201 lb 11.5 oz) IBW/kg (Calculated) : 79.9 Heparin Dosing Weight: 90.7 kg  Vital Signs: Temp: 98.2 F (36.8 C) (09/27 1700) Temp Source: Oral (09/27 1700) BP: 104/61 (09/27 1430) Pulse Rate: 96 (09/27 1900)  Labs: Recent Labs    04/13/2023 2103 04/11/23 0101 04/11/23 0248 04/11/23 0940 04/12/23 0310 04/12/23 0732 04/12/23 0753 04/12/23 1501 04/13/23 0407 04/13/23 0908 04/13/23 1726 04/13/23 1853  HGB 10.7*  --  10.9*  --  10.3*  --  10.5*  --  9.6*  --   --   --   HCT 32.8*  --  33.8*  --  31.2*  --  31.0*  --  29.4*  --   --   --   PLT 80*  --  83*  --  80*  --   --   --  86*  --   --   --   LABPROT 16.1*  --   --   --   --   --   --   --   --   --   --   --   INR 1.3*  --   --   --   --   --   --   --   --   --   --   --   HEPARINUNFRC  --   --   --    < >  --    < >  --    < > >1.10* 0.32  --  0.34  CREATININE 6.87*  --  7.05*  --  5.90*  --   --   --  5.97*  --  4.28*  --   TROPONINIHS 16,162* 16,109*  --   --   --   --   --   --   --   --   --   --    < > = values in this interval not displayed.    Estimated Creatinine Clearance: 15 mL/min (A) (by C-G formula based on SCr of 4.28 mg/dL (H)).   Medical History: Past Medical History:  Diagnosis Date   Cancer (HCC) 2019   colon   Chronic kidney disease    Diabetes mellitus without complication (HCC)    diet controlled   Hemodialysis access site with arteriovenous graft (HCC)    Hypertension    Infection due to Clostridium septicum (HCC) 08/27/2018   Renal insufficiency    Pt on dialysis x 3 years and receives every Monday, Wednesday and Friday.    Assessment: 82 yo M with NSTEMI taken to cath on 9/24 and found to have extensive disease. He was transferred to [facility] for definitive management. Pharmacy consulted to start heparin infusion  8hr after sheath pull.   Morning heparin level was > 1.1, likely lab error.  Repeat level returned at 0.32 and is therapeutic with heparin running at 1650 units/hr. Hgb (9.6) and PLTs (86) are stable. No signs of bleeding.   9/27 PM update: confirmatory heparin level 0.34 is therapeutic on 1650 units/hr.  No infusion issues or bleeding noted.  Goal of Therapy:  Heparin level 0.3-0.7 units/ml Monitor platelets by anticoagulation protocol: Yes  Plan:  Continue heparin drip at 1650 units/hr Monitor daily heparin level and CBC Monitor signs and symptoms of bleeding  Trixie Rude, PharmD Clinical Pharmacist 04/13/2023  7:38 PM

## 2023-04-13 NOTE — Procedures (Signed)
Radial arterial line placement:  Discussed with pulmonary critical care who had issues requiring radial axillary artery on the right upper extremity for arterial line placement.  I examined the radial artery with ultrasound and found that it was highly calcified in its anterior aspect.  For this reason I attempted radial arterial access with a radial access needle and threaded the wire into the artery.  Following this I used the inner core of a micropuncture sheath to dilate the tract and the arterial line was placed into the artery with immediate pulsatile blood return.  No acute complications with minimal blood loss.

## 2023-04-13 NOTE — Progress Notes (Signed)
NAME:  Jonathan Mcgrath, MRN:  [id number], DOB:  [birth year], LOS: 3 ADMISSION DATE:  05-07-23, CONSULTATION DATE:  05-07-23 REFERRING MD:  End - [facility] CHIEF COMPLAINT:  Weakness, NSTEMI  History of Present Illness:  82 year old man admitted to [facility] CVICU May 07, 2023 as a transfer from [facility] for NSTEMI and low flow state. 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   Patient was initially admitted to [facility] 9/23 with weakness x 1 week. He was found to have NSTEMI and acute HFrEF c/b low flow/low gradient AS. He was started on Heparin infusion (of note, history of vitreous hemorrhage of eyes 3 months ago; risks/benefits of heparin were apparently discussed with ophthalmology and it was felt benefit outweighed risk). Plan was for volume removal via HD given his ESRD status.  Echo 07-May-2023 demonstrated EF 20-25% with global hypokinesis and septal dyskinesis related to LBBB, mildly dilated RV with mod reduced fx, mod to severe PAH, small pericardial effusion, AV severely thickened with TR, mod to severe TR and mod MR. Patient was taken for [facility] May 07, 2023 which demonstrated critical ostial LAD/LCx disease with 90-95%, mod proximal RCA and severe RPAV disease, severely elevated L and R filling pressures, severe PAH. HD was attempted 2023-05-07, however due to hypotension patient was only able to tolerate 2L fluid removal prior to cessation.  Decision to transfer patient to [facility] for further Cardiology input, TCTS consultation and possible initiation of CRRT initiation. PCCM consulted 05/07/2023 for assistance with management.  Pertinent Medical History:   Past Medical History:  Diagnosis Date   Cancer (HCC) 2019   colon   Chronic kidney disease    Diabetes mellitus without complication (HCC)    diet controlled   Hemodialysis access site with arteriovenous graft (HCC)     Hypertension    Infection due to Clostridium septicum (HCC) 08/27/2018   Renal insufficiency    Pt on dialysis x 3 years and receives every Monday, Wednesday and Friday.    Significant Hospital Events: Including procedures, antibiotic start and stop dates in addition to other pertinent events   9/23 Admit to [facility] 05/07/23 L/RHC with findings of critical ostial LAD/LCx disease with 90-95%, mod proximal RCA and severe RPAV disease, severely elevated L and R filling pressures, severe PAH. Transfer to [facility] CVICU. 9/25 DNR, aggressive medical care desired. iHD trial 9/26 palliative consult. A lot of back and forth by pt re how he wants to proceed and ultimately he has decided to proceed with offered high risk cardiac intervention + requisite optimization preceding    Interim History / Subjective:   CRRT started overnight They aren't pulling much because his SBP is 90   Objective:  Blood pressure (!) 87/51, pulse 82, temperature 97.6 F (36.4 C), temperature source Oral, resp. rate 20, height 6\' 1"  (1.854 m), weight 91.5 kg, SpO2 100%.        Intake/Output Summary (Last 24 hours) at 04/13/2023 0936 Last data filed at 04/13/2023 0900 Gross per 24 hour  Intake 385.39 ml  Output 657.2 ml  Net -271.81 ml   Filed Weights   04/11/23 0500 04/11/23 1415 04/13/23 0500  Weight: 89.1 kg 89.1 kg 91.5 kg    Physical Examination: General: Chronically ill elderly M NAD  HEENT: NCAT  pink mm L internal jugular HD cath  Neuro: AAOx3 but has some intermittent confusion  CV:  harsh murmur. Cap refill < 3 sec  PULM: Even unlabored respirations, symmetrical chest expansion   ZO:XWRU + bowel sounds  Extremities: LUE AVF  Skin: c/d/w   Labs/imaging personally reviewed:  Echo 9/24 > EF 20-25% with global HK and septal dyskinesis related to LBBB, mildly dilated RV with mod reduced fx, mod to severe PAH, small pericardial effusion, AV severely thickened with TR, mod to severe TR and mod MR. [facility] 9/24 >  critical ostial LAD/LCx disease with 90-95%, mod proximal RCA and severe RPAV disease, severely elevated L and R filling pressures, severe PAH  Assessment & Plan:   mvCAD NSTEMI Severe AS Mod-Sev TR Mod MR AoC combined systolic and diastolic HF Severe PAH Hx HTN  P -not a surgical candidate.  - high-risk PCI w Impella,  subsequent likely TAVR was offered and he will go for coronary intervention Monday.  -optimizing volume status leading up to procedure as below  -will get an art line 9/27 per cards request  -Cards is consulting HF for further management.  -I think if this is the course of care we should just start NE now even prior to getting an art line -- pulling so little right now as is that it isn't really helping to impact his volume status.   Acute resp failure w hypoxia  -volume off via CRRT  -IS  ESRD MWF HD Hypocalcemia  P -started CRRT  -I'm going to add NE to facilitate pulling   Hx aortitis Hx clostridium septicum bacteremia  P - should be on augmetntin 500-125 mg daily, indefinitely. 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  -outpt follow up   DNR status Goals of care -He indicated DNR status 9/25, cards d/w him that he would need to be full code for high-risk intervention -Lots of fluctuation in GOC -- at various points has wanted no invasive interventions, at others has wanted all offered interventions -- frequent oscillation, but late 9/26 arrived at wanting to pursue the offered cardiac interventions    Best practice (evaluated daily):  Per Primary Team  Labs   CBC: Recent Labs  Lab 03/29/2023 0208 04/14/2023 1638 03/29/2023 2103 04/11/23 0248 04/12/23 0310  04/12/23 0753 04/13/23 0407  WBC 7.7  --  8.4 9.5 10.4  --  8.8  HGB 10.2*   < > 10.7* 10.9* 10.3* 10.5* 9.6*  HCT 31.6*   < > 32.8* 33.8* 31.2* 31.0* 29.4*  MCV 98.1  --  95.9 97.1 96.3  --  95.8  PLT 81*  --  80* 83* 80*  --  86*   < > = values in this interval not displayed.   Basic Metabolic Panel: Recent Labs  Lab 04/06/2023 0208 04/07/2023 1638 04/01/2023 2103 04/11/23 0248 04/12/23 0310 04/12/23 0753 04/13/23 0407  NA 139   < > 139 136 137 135 135  K 5.6*   < > 5.3* 5.5* 4.1 4.0 4.1  CL 100  --  100 97* 98  --  98  CO2 28  --  26 24 27   --  27  GLUCOSE 105*  --  84 97 78  --  91  BUN 40*  --  47* 52*  37*  --  41*  CREATININE 5.69*  --  6.87* 7.05* 5.90*  --  5.97*  CALCIUM 9.0  --  8.7* 8.6* 8.3*  --  8.2*  MG  --   --  2.0 2.1  --   --  1.8  PHOS  --   --  5.9* 6.2*  --   --  4.0   < > = values in this interval not displayed.   GFR: Estimated Creatinine Clearance: 10.8 mL/min (A) (by C-G formula based on SCr of 5.97 mg/dL (H)). Recent Labs  Lab 04/09/23 2130 04/28/2023 0002 Apr 18, 2023 0208 18-Apr-2023 1638 05/02/2023 2103 04/11/23 0248 04/12/23 0310 04/13/23 0407  WBC 8.7  --    < >  --  8.4 9.5 10.4 8.8  LATICACIDVEN 2.2* 1.2  --  1.3  --   --   --   --    < > = values in this interval not displayed.   Liver Function Tests: Recent Labs  Lab 04/09/23 2130 05/04/2023 2103 04/13/23 0407  AST 36 75*  --   ALT 33 54*  --   ALKPHOS 72 74  --   BILITOT 1.1 1.1  --   PROT 5.6* 5.8*  --   ALBUMIN 3.3* 3.2* 3.0*   No results for input(s): "LIPASE", "AMYLASE" in the last 168 hours. No results for input(s): "AMMONIA" in the last 168 hours.  ABG:    Component Value Date/Time   PHART 7.434 04/12/2023 0753   PCO2ART 40.3 04/12/2023 0753   PO2ART 67 (L) 04/12/2023 0753   HCO3 27.0 04/12/2023 0753   TCO2 28 04/12/2023 0753   O2SAT 94 04/12/2023 0753    Coagulation Profile: Recent Labs  Lab 04/09/23 2130 05/11/2023 2103  INR 1.3* 1.3*   Cardiac Enzymes: No  results for input(s): "CKTOTAL", "CKMB", "CKMBINDEX", "TROPONINI" in the last 168 hours.  HbA1C: Hemoglobin A1C  Date/Time Value Ref Range Status  06/04/2013 05:45 AM 5.9 4.2 - 6.3 % Final    Comment:    The American Diabetes Association recommends that a primary goal of therapy should be <7% and that physicians should reevaluate the treatment regimen in patients with HbA1c values consistently >8%.   01/27/2013 11:42 AM 7.5 (H) 4.2 - 6.3 % Final    Comment:    The American Diabetes Association recommends that a primary goal of therapy should be <7% and that physicians should reevaluate the treatment regimen in patients with HbA1c values consistently >8%.    Hgb A1c MFr Bld  Date/Time Value Ref Range Status  April 18, 2023 02:08 AM 5.8 (H) 4.8 - 5.6 % Final    Comment:    (NOTE) Pre diabetes:          5.7%-6.4%  Diabetes:              >6.4%  Glycemic control for   <7.0% adults with diabetes   07/12/2022 11:58 PM 5.8 (H) 4.8 - 5.6 % Final    Comment:    (NOTE)         Prediabetes: 5.7 - 6.4         Diabetes: >6.4         Glycemic control for adults with diabetes: <7.0    CBG: Recent Labs  Lab 04/11/23 1602 04/11/23 2156 04/12/23 0603 04/12/23 1159 04/12/23 1658  GLUCAP 99 93 77 104* 126*     CRITICAL CARE Performed by: Lanier Clam   Total critical care time: 38 min   Critical care  time was exclusive of separately billable procedures and treating other patients. Critical care was necessary to treat or prevent imminent or life-threatening deterioration.  Critical care was time spent personally by me on the following activities: development of treatment plan with patient and/or surrogate as well as nursing, discussions with consultants, evaluation of patient's response to treatment, examination of patient, obtaining history from patient or surrogate, ordering and performing treatments and interventions, ordering and review of laboratory studies, ordering and  review of radiographic studies, pulse oximetry and re-evaluation of patient's condition.  Tessie Fass MSN, AGACNP-BC [facility] Pulmonary/Critical Care Medicine Amion for pager  04/13/2023, 9:36 AM

## 2023-04-13 NOTE — Progress Notes (Signed)
Daily Progress Note   Patient Name: Jonathan Mcgrath       Date: 04/13/2023 DOB: [birth year]  Age: 82 y.o. MRN#: [id number] Attending Physician: Jonathan Pyo, MD Primary Care Physician: Jonathan Harpin, MD Admit Date: 03/21/2023 Length of Stay: 3 days  Reason for Consultation/Follow-up: Establishing goals of care  HPI/Patient Profile:  82 y.o. 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 He was admitted to [facility] CVICU 9/24 as a transfer from [facility]  on 04/05/2023 with NSTEMI and low flow state.   Subjective:   Subjective: Chart Reviewed. Updates received. Patient Assessed. Created space and opportunity for patient  and family to explore thoughts and feelings regarding current medical situation.  Today's Discussion: Today saw the patient at the bedside, he states this time is around but not currently in his room.  At the bedside is his granddaughter.  He states he is feeling okay.  He was little bit short of breath earlier today but this is improved.  He states they are going to "put a line in my wrist" and we discussed the arterial line plan.  He does feel some weaker.  Overall he seems to be in good spirits.  We discussed that palliative medicine would likely keep an eye on his chart over the weekend and would be available if needed.  I confirmed he had our contact information.  I told him that the medical team or nursing team could call us for any needs.  However, I would be back early next week and we will check on him when I am back on service if we are not needed over the weekend.  He verbalized appreciation.  I consulted with the medical team and nursing teams and explained the palliative plan at this point.  We will back off and monitor his chart over the weekend and I would return on service on Tuesday and follow-up.   They could always call us if needed and they verbalized understanding.  I provided emotional and general support through therapeutic listening, empathy, sharing of stories, therapeutic touch, and other techniques. I answered all questions and addressed all concerns to the best of my ability.  Review of Systems  Constitutional:  Positive for fatigue.       Denies pain in general  Respiratory:  Positive for shortness of breath (earlier in the day, now improved).   Gastrointestinal:  Negative for abdominal pain, nausea and vomiting.    Objective:   Vital Signs:  BP 121/69   Pulse 95   Temp 97.6 F (36.4 C) (Oral)   Resp (!) 21   Ht 6\' 1"  (1.854 m)   Wt 91.5 kg   SpO2 100%   BMI 26.61 kg/m   Physical Exam: Physical Exam Vitals and nursing note reviewed.  Constitutional:      General: He is not in acute distress.    Appearance: He is not toxic-appearing.  HENT:     Head: Normocephalic and atraumatic.  Cardiovascular:     Rate and Rhythm: Normal rate and regular rhythm.  Pulmonary:     Effort: Pulmonary effort is normal. No respiratory distress.     Breath sounds: Normal breath sounds. No wheezing or rhonchi.  Abdominal:     General: Abdomen is flat. Bowel sounds are normal. There is no distension.  Palpations: Abdomen is soft.     Tenderness: There is no abdominal tenderness.  Skin:    General: Skin is warm and dry.  Neurological:     General: No focal deficit present.     Mental Status: He is alert.  Psychiatric:        Mood and Affect: Mood normal.        Behavior: Behavior normal.    Palliative Assessment/Data: 40% (bedbound due to CRRT line)    Existing Vynca/ACP Documentation: Advanced directive signed 04/12/23  Assessment & Plan:   Impression: Present on Admission:  NSTEMI (non-ST elevated myocardial infarction) [facility])  82 year old male with chronic comorbidities and acute presentations as described above. The patient is currently in a very difficult  situation needing to decide on whether he would like to proceed with "last ditch effort" risky interventions. They understand that they are high risk and no guarantee, but they also understand without them that he is likely facing end-of-life. At this time the patient has decided to proceed with these interventions. He has completed HCPOA paperwork and is designated his son Jonathan Mcgrath is power of attorney. They understand the need to have further discussions amongst themselves for specific decisions on what the patient would wish should he be in a poor clinical state and unable to express his own wishes.  He has started CRRT for optimization prior to procedures and seems to be tolerating this well.  SUMMARY OF RECOMMENDATIONS   Remain DNR-interventions desired Continue optimization prior to procedures per medical team Continue encourage intrafamily communication on wishes moving forward Continued spiritual care for support Continued emotional spiritual support of patient and family Palliative medicine will continue to follow  Symptom Management:  Per primary team PMT is available to assist as needed  Code Status: DNR-Interventions desired  Prognosis: Unable to determine  Discharge Planning: To Be Determined  Discussed with: Patient, medical team, nursing team, family  Thank you for allowing [place] to participate in the care of Jonathan Mcgrath PMT will continue to support holistically.  Time Total: 40 min  Detailed review of medical records (labs, imaging, vital signs), medically appropriate exam, discussed with treatment team, counseling and education to patient, family, & staff, documenting clinical information, medication management, coordination of care  Wynne Dust, NP Palliative Medicine Team  Team Phone # [phone] (Nights/Weekends)  03/15/2021, 8:17 AM

## 2023-04-13 NOTE — Progress Notes (Addendum)
Patient Name: Jonathan Mcgrath Date of Encounter: 04/13/2023 [facility] Cardiologist: Yvonne Kendall, MD   Interval Summary  .    Patient and family had palliative care meeting yesterday and decision was to proceed with CVVHD and high risk cardiac intervention on Monday.  CVVHD yesterday after left IJ line placed with 50 cc/h fluid removal removal this morning the patient is hypotensive and fluid removal has been decreased to 40 cc.  This morning the patient feels improved with less dyspnea.   Vital Signs .    Vitals:   04/13/23 0800 04/13/23 0815 04/13/23 0830 04/13/23 0845  BP: (!) 86/56 94/60 91/71  (!) 75/50  Pulse: 84 84 82 85  Resp: 19 (!) 8 18 17   Temp:      TempSrc:      SpO2: 96% 100% 100% 98%  Weight:      Height:        Intake/Output Summary (Last 24 hours) at 04/13/2023 0910 Last data filed at 04/13/2023 0800 Gross per 24 hour  Intake 385.39 ml  Output 578.3 ml  Net -192.91 ml      04/13/2023    5:00 AM 04/11/2023    2:15 PM 04/11/2023    5:00 AM  Last 3 Weights  Weight (lbs) 201 lb 11.5 oz 196 lb 6.9 oz 196 lb 6.9 oz  Weight (kg) 91.5 kg 89.1 kg 89.1 kg      Telemetry/ECG    SR - Personally Reviewed  Physical Exam .   GEN: No acute distress.   Neck: +JVD Cardiac: RRR wth 3/6 SEM  Respiratory: Mild rales at bases GI: Soft, nontender, non-distended  MS: No edema; L forearm fistula Vasc:  +2 femoral pulses  Assessment & Plan .     Multivessel coronary artery disease/ACS.  After multidisciplinary review the patient is not a candidate for conventional  surgical revascularization and aortic valve replacement.  After pill of care consult the patient and his family have decided to pursue CVVHD as a bridge to high risk PCI with Impella.  I will discuss with patient and family later today.  For now we are planning on Impella placement with small BAV and high risk PCI.  Will entertain kissing stents versus a mini/nano crush technique.   Continue aspirin and heparin drip. Increase atorvastatin to 40mg  2.  Aortic stenosis: Echocardiogram in December 2023 demonstrated normal ejection fraction and moderate aortic stenosis.  Now has severe low-flow low gradient aortic stenosis.  Will pursue high risk PCI with balloon aortic valvuloplasty to partially relieve AS, increase BP, and allow for conventional dialysis.  If he decompensates over the weekend I have discussed with advanced heart failure that I think the best thing to do is place an Impella and not a balloon pump.  I will be available for this procedure if needed.  Following PCI we will consider a TAVR protocol CTA. 3.  Severe cardiomyopathy: Ejection fraction was normal last year.  I think there is a good chance for LV recovery with PCI and potentially TAVR down the road. 4.  End-stage renal disease: Wedge pressure on right heart catheterization was 30 with a PVR of less than 3.  The patient has had no significant volume removal with conventional dialysis.  Continue CVVHD.  Will place arterial line for more accurate blood pressure monitoring.  Will add norepinephrine for MAP of 70 mmHg.  Once on pressors and art line in, would resume -50cc/hr removal 5.  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  Status post bilateral iliac stenting for this in 2019. 6.  Cardiogenic shock: Place arterial line and add norepinephrine for map goal of 70 mmHg.  Advanced heart failure to see and help manage over the weekend.  I discussed with Dr. Gala Romney. 7.  Respiratory distress: Less dyspneic today.  Will place arterial line and monitor ABGs.  His chest x-ray does show pulmonary edema.  We still need to remove quite a bit of fluid prior to high risk intervention.  This may be limited by the patient's low blood pressure.  CRITICAL CARE Performed by: Orbie Pyo   Total critical care time: 30 minutes  Critical care time was exclusive of separately billable procedures and treating  other patients.  Critical care was necessary to treat or prevent imminent or life-threatening deterioration.  Critical care was time spent personally by me on the following activities: development of treatment plan with patient and/or surrogate as well as nursing, discussions with consultants, evaluation of patient's response to treatment, examination of patient, obtaining history from patient or surrogate, ordering and performing treatments and interventions, ordering and review of laboratory studies, ordering and review of radiographic studies, pulse oximetry and re-evaluation of patient's condition.  For questions or updates, please contact [facility] Please consult [email or web address] for contact info under        Signed, Orbie Pyo, MD

## 2023-04-13 NOTE — Progress Notes (Signed)
ANTICOAGULATION CONSULT NOTE  Pharmacy Consult for heparin gtt Indication: chest pain/ACS  No Known Allergies  Patient Measurements: Height: 6\' 1"  (185.4 cm) Weight: 91.5 kg (201 lb 11.5 oz) IBW/kg (Calculated) : 79.9 Heparin Dosing Weight: 90.7 kg  Vital Signs: Temp: 97.6 F (36.4 C) (09/27 0800) Temp Source: Oral (09/27 0800) BP: 109/64 (09/27 1000) Pulse Rate: 85 (09/27 1000)  Labs: Recent Labs    04/03/2023 2103 04/11/23 0101 04/11/23 0248 04/11/23 0940 04/12/23 0310 04/12/23 0732 04/12/23 0753 04/12/23 1501 04/13/23 0407 04/13/23 0908  HGB 10.7*  --  10.9*  --  10.3*  --  10.5*  --  9.6*  --   HCT 32.8*  --  33.8*  --  31.2*  --  31.0*  --  29.4*  --   PLT 80*  --  83*  --  80*  --   --   --  86*  --   LABPROT 16.1*  --   --   --   --   --   --   --   --   --   INR 1.3*  --   --   --   --   --   --   --   --   --   HEPARINUNFRC  --   --   --    < >  --    < >  --  0.22* >1.10* 0.32  CREATININE 6.87*  --  7.05*  --  5.90*  --   --   --  5.97*  --   TROPONINIHS 16,162* 16,109*  --   --   --   --   --   --   --   --    < > = values in this interval not displayed.    Estimated Creatinine Clearance: 10.8 mL/min (A) (by C-G formula based on SCr of 5.97 mg/dL (H)).   Medical History: Past Medical History:  Diagnosis Date   Cancer (HCC) 2019   colon   Chronic kidney disease    Diabetes mellitus without complication (HCC)    diet controlled   Hemodialysis access site with arteriovenous graft (HCC)    Hypertension    Infection due to Clostridium septicum (HCC) 08/27/2018   Renal insufficiency    Pt on dialysis x 3 years and receives every Monday, Wednesday and Friday.    Assessment: 82 yo M with NSTEMI taken to cath on 9/24 and found to have extensive disease. He was transferred to [facility] for definitive management. Pharmacy consulted to start heparin infusion 8hr after sheath pull.   Morning heparin level was > 1.1, likely lab error.  Repeat level returned at  0.32 and is therapeutic with heparin running at 1650 units/hr. Hgb (9.6) and PLTs (86) are stable. No signs of bleeding.   Goal of Therapy:  Heparin level 0.3-0.7 units/ml Monitor platelets by anticoagulation protocol: Yes  Plan:  Continue heparin drip at 1650 units/hr Repeat confirmatory heparin level in 8 hrs Monitor daily heparin level and CBC Monitor signs and symptoms of bleeding  Romie Minus, PharmD PGY1 Pharmacy Resident  Please check AMION for all [facility] Pharmacy phone numbers After 10:00 PM, call Main Pharmacy [phone]  04/13/2023 10:44 AM

## 2023-04-13 NOTE — Plan of Care (Signed)
Pt doing well tolerating CRRT. No signs of acute distress at this time.

## 2023-04-13 NOTE — Progress Notes (Addendum)
ANTICOAGULATION CONSULT NOTE  Pharmacy Consult for heparin gtt Indication: chest pain/ACS  No Known Allergies  Patient Measurements: Height: 6\' 1"  (185.4 cm) Weight: 89.1 kg (196 lb 6.9 oz) IBW/kg (Calculated) : 79.9 Heparin Dosing Weight: 90.7 kg  Vital Signs: BP: 89/53 (09/27 0500) Pulse Rate: 76 (09/27 0500)  Labs: Recent Labs    04/14/2023 1018 04/12/2023 1638 04/05/2023 2103 04/11/23 0101 04/11/23 0248 04/11/23 0940 04/12/23 0310 04/12/23 0732 04/12/23 0753 04/12/23 1501 04/13/23 0407  HGB  --    < > 10.7*  --  10.9*  --  10.3*  --  10.5*  --  9.6*  HCT  --    < > 32.8*  --  33.8*  --  31.2*  --  31.0*  --  29.4*  PLT  --    < > 80*  --  83*  --  80*  --   --   --  86*  LABPROT  --   --  16.1*  --   --   --   --   --   --   --   --   INR  --   --  1.3*  --   --   --   --   --   --   --   --   HEPARINUNFRC  --   --   --   --   --    < >  --  1.08*  --  0.22* <0.10*  CREATININE  --    < > 6.87*  --  7.05*  --  5.90*  --   --   --  5.97*  TROPONINIHS 9,478*  --  16,162* 17,871*  --   --   --   --   --   --   --    < > = values in this interval not displayed.    Estimated Creatinine Clearance: 10.8 mL/min (A) (by C-G formula based on SCr of 5.97 mg/dL (H)).   Medical History: Past Medical History:  Diagnosis Date   Cancer (HCC) 2019   colon   Chronic kidney disease    Diabetes mellitus without complication (HCC)    diet controlled   Hemodialysis access site with arteriovenous graft (HCC)    Hypertension    Infection due to Clostridium septicum (HCC) 08/27/2018   Renal insufficiency    Pt on dialysis x 3 years and receives every Monday, Wednesday and Friday.    Assessment: 82 yo M with NSTEMI taken to cath on 9/24 and found to have extensive disease. He was transferred to [facility] for definitive management. Pharmacy consulted to start heparin infusion 8hr after sheath pull.   Morning heparin level was 1.08, heparin lab drawn from same arm as heparin infusion. Likely  lab error.  Repeat level returned at 0.22 and is subtherapeutic with heparin running at 1550 units/hr. Hgb (10.5) and PLTs (80) are stable. No signs of bleeding.   9/27 AM: heparin level returned undetectable on 1650 units/hr. Per RN, no issues with the heparin running continuously or signs/symptoms of bleeding. Plts low, stable, Hgb 10.5 >9.6  Goal of Therapy:  Heparin level 0.3-0.7 units/ml Monitor platelets by anticoagulation protocol: Yes   Plan:  Heparin bolus 2000 units IV x1 Increase heparin drip to 1850 units/hr Repeat heparin level in 8 hrs Monitor daily heparin level and CBC Monitor signs and symptoms of bleeding  Arabella Merles, PharmD. Clinical Pharmacist 04/13/2023 5:34 AM   ADDENDUM -Lab called  RN to state heparin level was an error and it will need to be re-collected. RN informed pharmacy and we will continue rate prior to dose change at 1650 units/hr.  Arabella Merles, PharmD. Clinical Pharmacist 04/13/2023 5:44 AM

## 2023-04-13 NOTE — Progress Notes (Signed)
This chaplain responded to PMT NP-Eric consult for spiritual care. The Pt. oldest son is at the bedside and stepped out of the room during the visit.   The Pt. accepted the chaplain's invitation for storytelling. The Pt. shared his testimony of God's presence and his faithfulness in his ministry and life. The chaplain understands the Pt. chooses to receive and share God's love.  The Pt. and chaplain accept the gift of intercessory prayer.  This chaplain is available for F/U spiritual care as needed.  Chaplain Stephanie Acre [phone]

## 2023-04-13 NOTE — Progress Notes (Signed)
[facility] NEPHROLOGY PROGRESS NOTE  Assessment/ Plan: Pt is a 82 y.o. yo male  with past medical history significant for hypertension, DM,  CHF with EF of 20 to 25%, stroke, ESRD on HD MWF, colon cancer status post hemicolectomy who was transferred from [facility] for an NSTEMI, seen as a consultation for the management of ESRD.   # Acute on chronic CHF with EF of 20% CAD/NSTEMI/severe AS: The patient is currently on IV heparin.  Cardiology, PCCM is following. UF with CRRT.   # ESRD: MWF at [facility]: Patient received conventional HD on 9/25 with 500 cc complicated by IDH.  After GOC discussion, CRRT started on 9/26. UF as tolerated, all 4K bath, heparin for anticoagulation.   # Hypertension/volume: Issue with intradialytic hypotension.  Monitor BP.   # Anemia of ESRD: Hemoglobin at goal.  No need for ESA.  Try to hold ESA in the setting of CAD.   # CKD-metabolic Bone Disease: DC Auryxia as patient is on CRRT.  Discussed with ICU nurse.  Subjective: Seen and examined at bedside.  Tolerating CRRT well.  Patient denies nausea, vomiting, chest pain or shortness of breath.  Not on pressors.  Objective Vital signs in last 24 hours: Vitals:   04/13/23 0915 04/13/23 0930 04/13/23 0945 04/13/23 1000  BP: (!) 91/58 104/64 106/67 109/64  Pulse: 78 88 90 85  Resp: 15 19 18 16   Temp:      TempSrc:      SpO2: 97% 96% 100% 100%  Weight:      Height:       Weight change: 2.4 kg  Intake/Output Summary (Last 24 hours) at 04/13/2023 1059 Last data filed at 04/13/2023 1000 Gross per 24 hour  Intake 370.88 ml  Output 710.2 ml  Net -339.32 ml       Labs: RENAL PANEL Recent Labs  Lab 04/09/23 2130 04-17-23 0208 04-17-2023 1638 17-Apr-2023 2103 04/11/23 0248 04/12/23 0310 04/12/23 0753 04/13/23 0407 04/13/23 0908  NA 141 139   < > 139 136 137 135 135  --   K 4.4 5.6*   < > 5.3* 5.5* 4.1 4.0 4.1  --   CL 100 100  --  100 97* 98  --  98  --   CO2 26 28  --  26 24 27   --  27   --   GLUCOSE 119* 105*  --  84 97 78  --  91  --   BUN 35* 40*  --  47* 52* 37*  --  41*  --   CREATININE 5.11* 5.69*  --  6.87* 7.05* 5.90*  --  5.97*  --   CALCIUM 8.6* 9.0  --  8.7* 8.6* 8.3*  --  8.2*  --   MG  --   --   --  2.0 2.1  --   --  1.8  --   PHOS  --   --   --  5.9* 6.2*  --   --  4.0  --   ALBUMIN 3.3*  --   --  3.2*  --   --   --  3.0* 3.0*   < > = values in this interval not displayed.    Liver Function Tests: Recent Labs  Lab 04/09/23 2130 2023-04-17 2103 04/13/23 0407 04/13/23 0908  AST 36 75*  --  28  ALT 33 54*  --  35  ALKPHOS 72 74  --  61  BILITOT 1.1 1.1  --  0.8  PROT 5.6* 5.8*  --  5.4*  ALBUMIN 3.3* 3.2* 3.0* 3.0*   No results for input(s): "LIPASE", "AMYLASE" in the last 168 hours. No results for input(s): "AMMONIA" in the last 168 hours. CBC: Recent Labs    04-27-23 2103 04/11/23 0248 04/12/23 0310 04/12/23 0753 04/13/23 0407  HGB 10.7* 10.9* 10.3* 10.5* 9.6*  MCV 95.9 97.1 96.3  --  95.8    Cardiac Enzymes: No results for input(s): "CKTOTAL", "CKMB", "CKMBINDEX", "TROPONINI" in the last 168 hours. CBG: Recent Labs  Lab 04/11/23 1602 04/11/23 2156 04/12/23 0603 04/12/23 1159 04/12/23 1658  GLUCAP 99 93 77 104* 126*    Iron Studies: No results for input(s): "IRON", "TIBC", "TRANSFERRIN", "FERRITIN" in the last 72 hours. Studies/Results: DG CHEST PORT 1 VIEW  Result Date: 04/12/2023 CLINICAL DATA:  Central line placement. EXAM: PORTABLE CHEST 1 VIEW COMPARISON:  04/09/2023. FINDINGS: The heart is enlarged and the mediastinal contour stable. There is atherosclerotic calcification of the aorta. The pulmonary vasculature is distended. Interstitial and airspace opacities are noted in the lungs bilaterally. No effusion or pneumothorax. A left internal jugular central venous catheter terminates at the cavoatrial junction. Left subclavian/axillary stent is noted. No acute osseous abnormality. IMPRESSION: 1. Cardiomegaly with pulmonary  vascular congestion. 2. Interstitial and airspace opacities in the lungs bilaterally, possible edema versus multifocal pneumonia. 3. Left internal jugular central venous catheter terminates at the cavoatrial junction. Electronically Signed   By: Thornell Sartorius M.D.   On: 04/12/2023 22:20    Medications: Infusions:   prismasol BGK 4/2.5 600 mL/hr at 04/13/23 0544    prismasol BGK 4/2.5 400 mL/hr at 04/12/23 2128   sodium chloride     anticoagulant sodium citrate     heparin 1,650 Units/hr (04/13/23 0900)   norepinephrine (LEVOPHED) Adult infusion 2 mcg/min (04/13/23 0922)   prismasol BGK 4/2.5 1,000 mL/hr at 04/13/23 1610    Scheduled Medications:  amoxicillin-clavulanate  1 tablet Oral Q12H   aspirin EC  81 mg Oral Daily   [START ON 04/14/2023] atorvastatin  80 mg Oral Daily   Chlorhexidine Gluconate Cloth  6 each Topical Daily   Chlorhexidine Gluconate Cloth  6 each Topical Q0600   ferric citrate  420 mg Oral TID WC    have reviewed scheduled and prn medications.  Physical Exam: General:NAD, comfortable Heart:RRR, s1s2 nl Lungs:clear b/l, no crackle Abdomen:soft, Non-tender, non-distended Extremities:No edema Dialysis Access: AV fistula with good thrill and bruit.  Left IJ temporary HD catheter placed by PCCM on 9/26.  Keigo Whalley Prasad Marisol Glazer 04/13/2023,10:59 AM  LOS: 3 days

## 2023-04-13 NOTE — Procedures (Addendum)
R rad art line attempted x1 after RT attempted a couple times. Couldn't thread.  Moved to R axillary x2 -- couldn't thread the first and second just lost positioning.  Turned over to Dr. Katrinka Blazing who also attempted. Ultimately unable to place   Will d/w primary re options from here    Tessie Fass MSN, AGACNP-BC [facility] Pulmonary/Critical Care Medicine 04/13/2023, 1:31 PM

## 2023-04-13 NOTE — Consult Note (Addendum)
murmurs. Lungs: clear Abdomen: soft, nontender, nondistended. No hepatosplenomegaly. No bruits or masses. Good bowel sounds. Extremities: no cyanosis, clubbing, rash, edema. LUE fistula +bruit/thrill Neuro: alert & oriented x 3, cranial nerves grossly intact. moves all 4 extremities w/o difficulty. Affect pleasant.   Telemetry   NSR 80s (Personally reviewed)    EKG    NSR 1st degree AVB, LBBB 80s   Labs   Basic Metabolic Panel: Recent Labs  Lab 03/25/2023 0208 04/06/2023 1638 03/25/2023 May 29, 2102 04/11/23 0248 04/12/23 0310 04/12/23 0753 04/13/23 0407  NA 139   < > 139 136 137 135 135  K 5.6*   < > 5.3* 5.5* 4.1 4.0 4.1  CL 100  --  100 97* 98  --  98  CO2 28  --  26 24 27   --  27  GLUCOSE 105*  --  84 97 78  --  91  BUN 40*  --  47* 52* 37*  --  41*  CREATININE 5.69*  --  6.87* 7.05* 5.90*  --  5.97*  CALCIUM 9.0  --  8.7* 8.6* 8.3*  --  8.2*  MG  --   --  2.0 2.1  --   --  1.8  PHOS  --   --  5.9* 6.2*  --   --  4.0   < > = values in this interval not displayed.    Liver Function Tests: Recent Labs  Lab 04/09/23 05/29/29 03/19/2023 05/29/02 04/13/23 0407  AST 36 75*  --   ALT 33 54*  --   ALKPHOS 72 74  --   BILITOT 1.1 1.1  --   PROT  5.6* 5.8*  --   ALBUMIN 3.3* 3.2* 3.0*   No results for input(s): "LIPASE", "AMYLASE" in the last 168 hours. No results for input(s): "AMMONIA" in the last 168 hours.  CBC: Recent Labs  Lab 03/27/2023 0208 03/23/2023 1638 03/19/2023 2102/05/29 04/11/23 0248 04/12/23 0310 04/12/23 0753 04/13/23 0407  WBC 7.7  --  8.4 9.5 10.4  --  8.8  HGB 10.2*   < > 10.7* 10.9* 10.3* 10.5* 9.6*  HCT 31.6*   < > 32.8* 33.8* 31.2* 31.0* 29.4*  MCV 98.1  --  95.9 97.1 96.3  --  95.8  PLT 81*  --  80* 83* 80*  --  86*   < > = values in this interval not displayed.    Cardiac Enzymes: No results for input(s): "CKTOTAL", "CKMB", "CKMBINDEX", "TROPONINI" in the last 168 hours.  BNP: BNP (last 3 results) Recent Labs    04/09/23 2129-05-29  BNP 3,735.5*    ProBNP (last 3 results) No results for input(s): "PROBNP" in the last 8760 hours.   CBG: Recent Labs  Lab 04/11/23 1602 04/11/23 2156 04/12/23 0603 04/12/23 1159 04/12/23 1658  GLUCAP 99 93 77 104* 126*    Coagulation Studies: Recent Labs    04/15/2023 29-May-2102  LABPROT 16.1*  INR 1.3*     Imaging   DG CHEST PORT 1 VIEW  Result Date: 04/12/2023 CLINICAL DATA:  Central line placement. EXAM: PORTABLE CHEST 1 VIEW COMPARISON:  04/09/2023. FINDINGS: The heart is enlarged and the mediastinal contour stable. There is atherosclerotic calcification of the aorta. The pulmonary vasculature is distended. Interstitial and airspace opacities are noted in the lungs bilaterally. No effusion or pneumothorax. A left internal jugular central venous catheter terminates at the cavoatrial junction. Left subclavian/axillary stent is noted. No acute osseous abnormality. IMPRESSION: 1. Cardiomegaly with pulmonary vascular congestion. 2.  murmurs. Lungs: clear Abdomen: soft, nontender, nondistended. No hepatosplenomegaly. No bruits or masses. Good bowel sounds. Extremities: no cyanosis, clubbing, rash, edema. LUE fistula +bruit/thrill Neuro: alert & oriented x 3, cranial nerves grossly intact. moves all 4 extremities w/o difficulty. Affect pleasant.   Telemetry   NSR 80s (Personally reviewed)    EKG    NSR 1st degree AVB, LBBB 80s   Labs   Basic Metabolic Panel: Recent Labs  Lab 03/25/2023 0208 04/06/2023 1638 03/25/2023 May 29, 2102 04/11/23 0248 04/12/23 0310 04/12/23 0753 04/13/23 0407  NA 139   < > 139 136 137 135 135  K 5.6*   < > 5.3* 5.5* 4.1 4.0 4.1  CL 100  --  100 97* 98  --  98  CO2 28  --  26 24 27   --  27  GLUCOSE 105*  --  84 97 78  --  91  BUN 40*  --  47* 52* 37*  --  41*  CREATININE 5.69*  --  6.87* 7.05* 5.90*  --  5.97*  CALCIUM 9.0  --  8.7* 8.6* 8.3*  --  8.2*  MG  --   --  2.0 2.1  --   --  1.8  PHOS  --   --  5.9* 6.2*  --   --  4.0   < > = values in this interval not displayed.    Liver Function Tests: Recent Labs  Lab 04/09/23 05/29/29 03/19/2023 05/29/02 04/13/23 0407  AST 36 75*  --   ALT 33 54*  --   ALKPHOS 72 74  --   BILITOT 1.1 1.1  --   PROT  5.6* 5.8*  --   ALBUMIN 3.3* 3.2* 3.0*   No results for input(s): "LIPASE", "AMYLASE" in the last 168 hours. No results for input(s): "AMMONIA" in the last 168 hours.  CBC: Recent Labs  Lab 03/27/2023 0208 03/23/2023 1638 03/19/2023 2102/05/29 04/11/23 0248 04/12/23 0310 04/12/23 0753 04/13/23 0407  WBC 7.7  --  8.4 9.5 10.4  --  8.8  HGB 10.2*   < > 10.7* 10.9* 10.3* 10.5* 9.6*  HCT 31.6*   < > 32.8* 33.8* 31.2* 31.0* 29.4*  MCV 98.1  --  95.9 97.1 96.3  --  95.8  PLT 81*  --  80* 83* 80*  --  86*   < > = values in this interval not displayed.    Cardiac Enzymes: No results for input(s): "CKTOTAL", "CKMB", "CKMBINDEX", "TROPONINI" in the last 168 hours.  BNP: BNP (last 3 results) Recent Labs    04/09/23 2129-05-29  BNP 3,735.5*    ProBNP (last 3 results) No results for input(s): "PROBNP" in the last 8760 hours.   CBG: Recent Labs  Lab 04/11/23 1602 04/11/23 2156 04/12/23 0603 04/12/23 1159 04/12/23 1658  GLUCAP 99 93 77 104* 126*    Coagulation Studies: Recent Labs    04/15/2023 29-May-2102  LABPROT 16.1*  INR 1.3*     Imaging   DG CHEST PORT 1 VIEW  Result Date: 04/12/2023 CLINICAL DATA:  Central line placement. EXAM: PORTABLE CHEST 1 VIEW COMPARISON:  04/09/2023. FINDINGS: The heart is enlarged and the mediastinal contour stable. There is atherosclerotic calcification of the aorta. The pulmonary vasculature is distended. Interstitial and airspace opacities are noted in the lungs bilaterally. No effusion or pneumothorax. A left internal jugular central venous catheter terminates at the cavoatrial junction. Left subclavian/axillary stent is noted. No acute osseous abnormality. IMPRESSION: 1. Cardiomegaly with pulmonary vascular congestion. 2.  murmurs. Lungs: clear Abdomen: soft, nontender, nondistended. No hepatosplenomegaly. No bruits or masses. Good bowel sounds. Extremities: no cyanosis, clubbing, rash, edema. LUE fistula +bruit/thrill Neuro: alert & oriented x 3, cranial nerves grossly intact. moves all 4 extremities w/o difficulty. Affect pleasant.   Telemetry   NSR 80s (Personally reviewed)    EKG    NSR 1st degree AVB, LBBB 80s   Labs   Basic Metabolic Panel: Recent Labs  Lab 03/25/2023 0208 04/06/2023 1638 03/25/2023 May 29, 2102 04/11/23 0248 04/12/23 0310 04/12/23 0753 04/13/23 0407  NA 139   < > 139 136 137 135 135  K 5.6*   < > 5.3* 5.5* 4.1 4.0 4.1  CL 100  --  100 97* 98  --  98  CO2 28  --  26 24 27   --  27  GLUCOSE 105*  --  84 97 78  --  91  BUN 40*  --  47* 52* 37*  --  41*  CREATININE 5.69*  --  6.87* 7.05* 5.90*  --  5.97*  CALCIUM 9.0  --  8.7* 8.6* 8.3*  --  8.2*  MG  --   --  2.0 2.1  --   --  1.8  PHOS  --   --  5.9* 6.2*  --   --  4.0   < > = values in this interval not displayed.    Liver Function Tests: Recent Labs  Lab 04/09/23 05/29/29 03/19/2023 05/29/02 04/13/23 0407  AST 36 75*  --   ALT 33 54*  --   ALKPHOS 72 74  --   BILITOT 1.1 1.1  --   PROT  5.6* 5.8*  --   ALBUMIN 3.3* 3.2* 3.0*   No results for input(s): "LIPASE", "AMYLASE" in the last 168 hours. No results for input(s): "AMMONIA" in the last 168 hours.  CBC: Recent Labs  Lab 03/27/2023 0208 03/23/2023 1638 03/19/2023 2102/05/29 04/11/23 0248 04/12/23 0310 04/12/23 0753 04/13/23 0407  WBC 7.7  --  8.4 9.5 10.4  --  8.8  HGB 10.2*   < > 10.7* 10.9* 10.3* 10.5* 9.6*  HCT 31.6*   < > 32.8* 33.8* 31.2* 31.0* 29.4*  MCV 98.1  --  95.9 97.1 96.3  --  95.8  PLT 81*  --  80* 83* 80*  --  86*   < > = values in this interval not displayed.    Cardiac Enzymes: No results for input(s): "CKTOTAL", "CKMB", "CKMBINDEX", "TROPONINI" in the last 168 hours.  BNP: BNP (last 3 results) Recent Labs    04/09/23 2129-05-29  BNP 3,735.5*    ProBNP (last 3 results) No results for input(s): "PROBNP" in the last 8760 hours.   CBG: Recent Labs  Lab 04/11/23 1602 04/11/23 2156 04/12/23 0603 04/12/23 1159 04/12/23 1658  GLUCAP 99 93 77 104* 126*    Coagulation Studies: Recent Labs    04/15/2023 29-May-2102  LABPROT 16.1*  INR 1.3*     Imaging   DG CHEST PORT 1 VIEW  Result Date: 04/12/2023 CLINICAL DATA:  Central line placement. EXAM: PORTABLE CHEST 1 VIEW COMPARISON:  04/09/2023. FINDINGS: The heart is enlarged and the mediastinal contour stable. There is atherosclerotic calcification of the aorta. The pulmonary vasculature is distended. Interstitial and airspace opacities are noted in the lungs bilaterally. No effusion or pneumothorax. A left internal jugular central venous catheter terminates at the cavoatrial junction. Left subclavian/axillary stent is noted. No acute osseous abnormality. IMPRESSION: 1. Cardiomegaly with pulmonary vascular congestion. 2.  murmurs. Lungs: clear Abdomen: soft, nontender, nondistended. No hepatosplenomegaly. No bruits or masses. Good bowel sounds. Extremities: no cyanosis, clubbing, rash, edema. LUE fistula +bruit/thrill Neuro: alert & oriented x 3, cranial nerves grossly intact. moves all 4 extremities w/o difficulty. Affect pleasant.   Telemetry   NSR 80s (Personally reviewed)    EKG    NSR 1st degree AVB, LBBB 80s   Labs   Basic Metabolic Panel: Recent Labs  Lab 03/25/2023 0208 04/06/2023 1638 03/25/2023 May 29, 2102 04/11/23 0248 04/12/23 0310 04/12/23 0753 04/13/23 0407  NA 139   < > 139 136 137 135 135  K 5.6*   < > 5.3* 5.5* 4.1 4.0 4.1  CL 100  --  100 97* 98  --  98  CO2 28  --  26 24 27   --  27  GLUCOSE 105*  --  84 97 78  --  91  BUN 40*  --  47* 52* 37*  --  41*  CREATININE 5.69*  --  6.87* 7.05* 5.90*  --  5.97*  CALCIUM 9.0  --  8.7* 8.6* 8.3*  --  8.2*  MG  --   --  2.0 2.1  --   --  1.8  PHOS  --   --  5.9* 6.2*  --   --  4.0   < > = values in this interval not displayed.    Liver Function Tests: Recent Labs  Lab 04/09/23 05/29/29 03/19/2023 05/29/02 04/13/23 0407  AST 36 75*  --   ALT 33 54*  --   ALKPHOS 72 74  --   BILITOT 1.1 1.1  --   PROT  5.6* 5.8*  --   ALBUMIN 3.3* 3.2* 3.0*   No results for input(s): "LIPASE", "AMYLASE" in the last 168 hours. No results for input(s): "AMMONIA" in the last 168 hours.  CBC: Recent Labs  Lab 03/27/2023 0208 03/23/2023 1638 03/19/2023 2102/05/29 04/11/23 0248 04/12/23 0310 04/12/23 0753 04/13/23 0407  WBC 7.7  --  8.4 9.5 10.4  --  8.8  HGB 10.2*   < > 10.7* 10.9* 10.3* 10.5* 9.6*  HCT 31.6*   < > 32.8* 33.8* 31.2* 31.0* 29.4*  MCV 98.1  --  95.9 97.1 96.3  --  95.8  PLT 81*  --  80* 83* 80*  --  86*   < > = values in this interval not displayed.    Cardiac Enzymes: No results for input(s): "CKTOTAL", "CKMB", "CKMBINDEX", "TROPONINI" in the last 168 hours.  BNP: BNP (last 3 results) Recent Labs    04/09/23 2129-05-29  BNP 3,735.5*    ProBNP (last 3 results) No results for input(s): "PROBNP" in the last 8760 hours.   CBG: Recent Labs  Lab 04/11/23 1602 04/11/23 2156 04/12/23 0603 04/12/23 1159 04/12/23 1658  GLUCAP 99 93 77 104* 126*    Coagulation Studies: Recent Labs    04/15/2023 29-May-2102  LABPROT 16.1*  INR 1.3*     Imaging   DG CHEST PORT 1 VIEW  Result Date: 04/12/2023 CLINICAL DATA:  Central line placement. EXAM: PORTABLE CHEST 1 VIEW COMPARISON:  04/09/2023. FINDINGS: The heart is enlarged and the mediastinal contour stable. There is atherosclerotic calcification of the aorta. The pulmonary vasculature is distended. Interstitial and airspace opacities are noted in the lungs bilaterally. No effusion or pneumothorax. A left internal jugular central venous catheter terminates at the cavoatrial junction. Left subclavian/axillary stent is noted. No acute osseous abnormality. IMPRESSION: 1. Cardiomegaly with pulmonary vascular congestion. 2.  Advanced Heart Failure Team Consult Note   Primary Physician: Estell Harpin, MD PCP-Cardiologist:  Yvonne Kendall, MD  Reason for Consultation: Optimization prior to high-risk PCI with Impella support  HPI:    Jonathan Mcgrath is seen today for evaluation of optimization prior to high-risk PCI with Impella support at the request of Dr. Lynnette Caffey, Cardiology.   Jonathan Mcgrath is a 82 y.o. 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   Mr. Winker initially presented to [facility] via EMS after experiencing waekness and dizziness at home. He was found to be orthostatic in the ED and was given IVF. Denies CP. Had not been having any issues tolerating iHD in the past however 9/20 they did tell his that his BP was low in iHD. Admission studies reviewed: SBP low 100s, SCr 5.1, lactic acid 2.2>1.2>1.3, K 4.4BNP 3735. CXR with pulmonary edema. Echo showed EF 20-25%. 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    Since being at [facility] he was unable to be diuresed with iHD so he was started on CRRT. The family and team have had multiple GOC discussions to clearly define patients wishes. At this time he wished to proceed to go forward with high risk interventions but would like to be DNR/DNI otherwise. He is now requiring pressor support to tolerate fluid removal with CRRT. TCTS evaluated deemed patient not a candidate for surgery. Plan is now for high-risk PCI with impella support. Plan to revisit TAVR after PCI complete.   Patient resting comfortably in bed, son at bedside. Denies CP/SOB.   Cardiac studies reviewed:  RHC 9/24: Critical ostial LAD/LCx disease with 90-95% stenosis.  Moderate proximal RCA and severe RPAV disease. Severely  elevated left and right heart filling pressures. Severe pulmonary hypertension. Normal Fick cardiac output/index, likely elevated by AV fistula. RA 17, PA 80/30 (47), PCWP 30, Fick CO/CI 7.2/3.3. TD CO/CI 5.5/2.6 Echo 9/24: EF 20-25%, LV GHK, mod reduced RV, mod-sev pulmonary hypertension (RVSP 55-60), LA mildly dilated, small pericardial effusion, moderate MR, mod-severe TR, severe low-flow/low-gradient AV stenosis.  Echo 12/23: EF 55-60%, GIIDD  Home Medications Prior to Admission medications   Medication Sig Start Date End Date Taking? Authorizing Provider  acetaminophen (TYLENOL) 325 MG tablet Take 2 tablets (650 mg total) by mouth every 4 (four) hours as needed for mild pain (or temp > 37.5 C (99.5 F)). 07/15/22   Marguerita Merles Latif, DO  amoxicillin-clavulanate (AUGMENTIN) 500-125 MG tablet Take 1 tablet by mouth daily. Patient not taking: Reported on 04/08/2023 12/19/22   Lynn Ito, MD  aspirin EC 81 MG tablet Take 1 tablet (81 mg total) by mouth daily. Swallow whole. 07/16/22   Marguerita Merles Latif, DO  atorvastatin (LIPITOR) 40 MG tablet Take 1 tablet (40 mg total) by mouth daily. Patient not taking: Reported on 04/05/2023 07/16/22   Marguerita Merles Latif, DO  B Complex-C-Folic Acid (RENA-VITE RX) 1 MG TABS Take 1 tablet by mouth daily. Patient not taking: Reported on 04/11/2023 11/02/21   [provider]  cinacalcet (SENSIPAR) 60 MG tablet Take 60 mg by mouth daily. 03/21/22   [provider]  Ergocalciferol (VITAMIN D2) 10 MCG (400 UNIT) TABS Take 1 tab daily Patient not taking: Reported on 03/31/2023 01/16/22   Enedina Finner, MD  famotidine (  murmurs. Lungs: clear Abdomen: soft, nontender, nondistended. No hepatosplenomegaly. No bruits or masses. Good bowel sounds. Extremities: no cyanosis, clubbing, rash, edema. LUE fistula +bruit/thrill Neuro: alert & oriented x 3, cranial nerves grossly intact. moves all 4 extremities w/o difficulty. Affect pleasant.   Telemetry   NSR 80s (Personally reviewed)    EKG    NSR 1st degree AVB, LBBB 80s   Labs   Basic Metabolic Panel: Recent Labs  Lab 03/25/2023 0208 04/06/2023 1638 03/25/2023 May 29, 2102 04/11/23 0248 04/12/23 0310 04/12/23 0753 04/13/23 0407  NA 139   < > 139 136 137 135 135  K 5.6*   < > 5.3* 5.5* 4.1 4.0 4.1  CL 100  --  100 97* 98  --  98  CO2 28  --  26 24 27   --  27  GLUCOSE 105*  --  84 97 78  --  91  BUN 40*  --  47* 52* 37*  --  41*  CREATININE 5.69*  --  6.87* 7.05* 5.90*  --  5.97*  CALCIUM 9.0  --  8.7* 8.6* 8.3*  --  8.2*  MG  --   --  2.0 2.1  --   --  1.8  PHOS  --   --  5.9* 6.2*  --   --  4.0   < > = values in this interval not displayed.    Liver Function Tests: Recent Labs  Lab 04/09/23 05/29/29 03/19/2023 05/29/02 04/13/23 0407  AST 36 75*  --   ALT 33 54*  --   ALKPHOS 72 74  --   BILITOT 1.1 1.1  --   PROT  5.6* 5.8*  --   ALBUMIN 3.3* 3.2* 3.0*   No results for input(s): "LIPASE", "AMYLASE" in the last 168 hours. No results for input(s): "AMMONIA" in the last 168 hours.  CBC: Recent Labs  Lab 03/27/2023 0208 03/23/2023 1638 03/19/2023 2102/05/29 04/11/23 0248 04/12/23 0310 04/12/23 0753 04/13/23 0407  WBC 7.7  --  8.4 9.5 10.4  --  8.8  HGB 10.2*   < > 10.7* 10.9* 10.3* 10.5* 9.6*  HCT 31.6*   < > 32.8* 33.8* 31.2* 31.0* 29.4*  MCV 98.1  --  95.9 97.1 96.3  --  95.8  PLT 81*  --  80* 83* 80*  --  86*   < > = values in this interval not displayed.    Cardiac Enzymes: No results for input(s): "CKTOTAL", "CKMB", "CKMBINDEX", "TROPONINI" in the last 168 hours.  BNP: BNP (last 3 results) Recent Labs    04/09/23 2129-05-29  BNP 3,735.5*    ProBNP (last 3 results) No results for input(s): "PROBNP" in the last 8760 hours.   CBG: Recent Labs  Lab 04/11/23 1602 04/11/23 2156 04/12/23 0603 04/12/23 1159 04/12/23 1658  GLUCAP 99 93 77 104* 126*    Coagulation Studies: Recent Labs    04/15/2023 29-May-2102  LABPROT 16.1*  INR 1.3*     Imaging   DG CHEST PORT 1 VIEW  Result Date: 04/12/2023 CLINICAL DATA:  Central line placement. EXAM: PORTABLE CHEST 1 VIEW COMPARISON:  04/09/2023. FINDINGS: The heart is enlarged and the mediastinal contour stable. There is atherosclerotic calcification of the aorta. The pulmonary vasculature is distended. Interstitial and airspace opacities are noted in the lungs bilaterally. No effusion or pneumothorax. A left internal jugular central venous catheter terminates at the cavoatrial junction. Left subclavian/axillary stent is noted. No acute osseous abnormality. IMPRESSION: 1. Cardiomegaly with pulmonary vascular congestion. 2.

## 2023-04-14 DIAGNOSIS — K5900 Constipation, unspecified: Secondary | ICD-10-CM

## 2023-04-14 DIAGNOSIS — D696 Thrombocytopenia, unspecified: Secondary | ICD-10-CM

## 2023-04-14 DIAGNOSIS — R110 Nausea: Secondary | ICD-10-CM

## 2023-04-14 DIAGNOSIS — D649 Anemia, unspecified: Secondary | ICD-10-CM

## 2023-04-14 DIAGNOSIS — N186 End stage renal disease: Secondary | ICD-10-CM | POA: Diagnosis not present

## 2023-04-14 DIAGNOSIS — I214 Non-ST elevation (NSTEMI) myocardial infarction: Secondary | ICD-10-CM | POA: Diagnosis not present

## 2023-04-14 LAB — RENAL FUNCTION PANEL
Albumin: 3 g/dL — ABNORMAL LOW (ref 3.5–5.0)
Albumin: 3 g/dL — ABNORMAL LOW (ref 3.5–5.0)
Anion gap: 11 (ref 5–15)
Anion gap: 11 (ref 5–15)
BUN: 28 mg/dL — ABNORMAL HIGH (ref 8–23)
BUN: 24 mg/dL — ABNORMAL HIGH (ref 8–23)
CO2: 24 mmol/L (ref 22–32)
CO2: 23 mmol/L (ref 22–32)
Calcium: 8.8 mg/dL — ABNORMAL LOW (ref 8.9–10.3)
Calcium: 8.8 mg/dL — ABNORMAL LOW (ref 8.9–10.3)
Chloride: 100 mmol/L (ref 98–111)
Chloride: 100 mmol/L (ref 98–111)
Creatinine, Ser: 3.84 mg/dL — ABNORMAL HIGH (ref 0.61–1.24)
Creatinine, Ser: 3.21 mg/dL — ABNORMAL HIGH (ref 0.61–1.24)
GFR, Estimated: 15 mL/min — ABNORMAL LOW (ref >60)
GFR, Estimated: 19 mL/min — ABNORMAL LOW (ref >60)
Glucose, Bld: 132 mg/dL — ABNORMAL HIGH (ref 70–99)
Glucose, Bld: 212 mg/dL — ABNORMAL HIGH (ref 70–99)
Phosphorus: 2.9 mg/dL (ref 2.5–4.6)
Phosphorus: 2.7 mg/dL (ref 2.5–4.6)
Potassium: 4.5 mmol/L (ref 3.5–5.1)
Potassium: 4.8 mmol/L (ref 3.5–5.1)
Sodium: 135 mmol/L (ref 135–145)
Sodium: 134 mmol/L — ABNORMAL LOW (ref 135–145)

## 2023-04-14 LAB — CULTURE, BLOOD (SINGLE): Culture: NO GROWTH

## 2023-04-14 LAB — CBC
HCT: 31 % — ABNORMAL LOW (ref 39.0–52.0)
Hemoglobin: 10 g/dL — ABNORMAL LOW (ref 13.0–17.0)
MCH: 31.4 pg (ref 26.0–34.0)
MCHC: 32.3 g/dL (ref 30.0–36.0)
MCV: 97.5 fL (ref 80.0–100.0)
Platelets: 135 10*3/uL — ABNORMAL LOW (ref 150–400)
RBC: 3.18 MIL/uL — ABNORMAL LOW (ref 4.22–5.81)
RDW: 15.2 % (ref 11.5–15.5)
WBC: 15.5 10*3/uL — ABNORMAL HIGH (ref 4.0–10.5)
nRBC: 1.8 % — ABNORMAL HIGH (ref 0.0–0.2)

## 2023-04-14 LAB — COOXEMETRY PANEL
Carboxyhemoglobin: 1.2 % (ref 0.5–1.5)
Methemoglobin: <0.7 % (ref 0.0–1.5)
O2 Saturation: 78.4 %
Total hemoglobin: 10.4 g/dL — ABNORMAL LOW (ref 12.0–16.0)

## 2023-04-14 LAB — HEPARIN LEVEL (UNFRACTIONATED)
Heparin Unfractionated: 0.29 [IU]/mL — ABNORMAL LOW (ref 0.30–0.70)
Heparin Unfractionated: 0.49 [IU]/mL (ref 0.30–0.70)

## 2023-04-14 LAB — GLUCOSE, CAPILLARY: Glucose-Capillary: 174 mg/dL — ABNORMAL HIGH (ref 70–99)

## 2023-04-14 LAB — MAGNESIUM: Magnesium: 2.3 mg/dL (ref 1.7–2.4)

## 2023-04-14 MED ORDER — VASOPRESSIN 20 UNITS/100 ML INFUSION FOR SHOCK
0.0000 [IU]/min | INTRAVENOUS | Status: DC
  Administered 2023-04-14: 0.02 [IU]/min via INTRAVENOUS
  Administered 2023-04-14 – 2023-04-17 (×6): 0.03 [IU]/min via INTRAVENOUS
  Administered 2023-04-17: 0.02 [IU]/min via INTRAVENOUS
  Filled 2023-04-14 (×7): qty 100

## 2023-04-14 MED ORDER — AMIODARONE HCL IN DEXTROSE 360-4.14 MG/200ML-% IV SOLN
30.0000 mg/h | INTRAVENOUS | Status: DC
  Administered 2023-04-14: 60 mg/h via INTRAVENOUS
  Filled 2023-04-14 (×3): qty 200

## 2023-04-14 MED ORDER — AMIODARONE HCL IN DEXTROSE 360-4.14 MG/200ML-% IV SOLN
30.0000 mg/h | INTRAVENOUS | Status: DC
  Administered 2023-04-14 – 2023-04-17 (×7): 30 mg/h via INTRAVENOUS
  Filled 2023-04-14 (×5): qty 200

## 2023-04-14 MED ORDER — POLYETHYLENE GLYCOL 3350 17 G PO PACK
17.0000 g | PACK | Freq: Every day | ORAL | Status: DC
  Administered 2023-04-14: 17 g via ORAL
  Filled 2023-04-14 (×3): qty 1

## 2023-04-14 MED ORDER — INSULIN ASPART 100 UNIT/ML IJ SOLN: 0.0000 [IU] | Freq: Every day | INTRAMUSCULAR | Status: DC

## 2023-04-14 MED ORDER — INSULIN ASPART 100 UNIT/ML IJ SOLN
0.0000 [IU] | Freq: Three times a day (TID) | INTRAMUSCULAR | Status: DC
  Administered 2023-04-15 – 2023-04-17 (×5): 1 [IU] via SUBCUTANEOUS

## 2023-04-14 MED ORDER — SENNA 8.6 MG PO TABS: 1.0000 | ORAL_TABLET | Freq: Every day | ORAL | Status: DC | PRN

## 2023-04-14 NOTE — Consult Note (Signed)
Advanced Heart Failure Team Progress Note   Primary Physician: Estell Harpin, MD PCP-Cardiologist:  Yvonne Kendall, MD  Reason for Consultation: Optimization prior to high-risk PCI with Impella support  HPI:     Jonathan Mcgrath is a 82 y.o. 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  Since being at [facility] he was unable to be diuresed with iHD so he was started on CRRT. The family and team have had multiple GOC discussions to clearly define patients wishes. At this time he wished to proceed to go forward with high risk interventions but would like to be DNR/DNI otherwise. TCTS evaluated deemed patient not a candidate for surgery. Plan is now for high-risk PCI with impella support. Plan to revisit TAVR after PCI complete.   Patient resting comfortably in bed, son at bedside. Denies CP/SOB.    Allergies:  No Known Allergies  Objective:    Vital Signs:   Temp:  [98 F (36.7 C)-98.7 F (37.1 C)] 98 F (36.7 C) (09/28 0754) Pulse Rate:  [71-96] 88 (09/28 1130) Resp:  [0-28] 19 (09/28 1130) BP: (87-113)/(57-61) 104/61 (09/27 1430) SpO2:  [88 %-100 %] 96 % (09/28 1130) Arterial Line BP: (96-141)/(39-66) 135/65 (09/28 1130) Weight:  [91.3 kg] 91.3 kg (09/28 0500) Last BM Date : 04/12/23  Weight change: Filed Weights   04/11/23 1415 04/13/23 0500 04/14/23 0500  Weight: 89.1 kg 91.5 kg 91.3 kg   Intake/Output:   Intake/Output Summary (Last 24 hours) at 04/14/2023 1249 Last data filed at 04/14/2023 1200 Gross per 24 hour  Intake 800.16 ml  Output 2074.8 ml  Net -1274.64 ml    Physical Exam  General:   elderly appearing.  No respiratory difficulty HEENT: normal Neck: supple. JVD elevated. Carotids 2+ bilat; no bruits. No lymphadenopathy or thyromegaly appreciated. LIJ HD cath Cor: PMI nondisplaced. Regular rate & rhythm. No rubs, gallops or murmurs. Lungs: clear Abdomen: soft, nontender, nondistended. No hepatosplenomegaly. No bruits or masses. Good bowel sounds. Extremities: no cyanosis, clubbing, rash, edema. LUE fistula +bruit/thrill Neuro: alert & oriented x 3, cranial nerves grossly intact. moves all 4 extremities w/o difficulty. Affect pleasant.   Telemetry   NSR 80s (Personally reviewed)    EKG    NSR 1st degree AVB, LBBB 80s   Labs   Basic Metabolic Panel: Recent Labs  Lab 04/14/2023 2103 04/11/23 0248 04/12/23 0310 04/12/23 0753 04/13/23 0407 04/13/23 1726 04/13/23 2011 04/14/23 0405  NA 139 136 137 135 135 135  --  135  K 5.3* 5.5* 4.1 4.0 4.1 4.6  --  4.5  CL 100 97* 98  --  98 100  --  100  CO2 26 24 27   --  27 25  --  24  GLUCOSE 84 97 78  --  91 141*  --  132*  BUN 47* 52* 37*  --  41* 31*  --  28*  CREATININE 6.87* 7.05* 5.90*  --  5.97* 4.28*  --  3.84*  CALCIUM 8.7* 8.6* 8.3*  --  8.2* 8.5*  --  8.8*  MG 2.0 2.1  --   --  1.8  --  2.2 2.3  PHOS 5.9*  6.2*  --   --  4.0 3.0  --  2.9    Liver Function Tests: Recent Labs  Lab 04/09/23 2130 03/29/2023 2103 04/13/23 0407 04/13/23 0908 04/13/23 1726 04/14/23 0405  AST 36 75*  --  28  --   --   ALT 33 54*  --  35  --   --   ALKPHOS 72 74  --  61  --   --   BILITOT 1.1 1.1  --  0.8  --   --   PROT 5.6* 5.8*  --  5.4*  --   --   ALBUMIN 3.3* 3.2* 3.0* 3.0* 3.1* 3.0*   CBC: Recent Labs  Lab 04/01/2023 2103 04/11/23 0248 04/12/23 0310 04/12/23 0753 04/13/23 0407 04/14/23 0405  WBC 8.4 9.5 10.4  --  8.8 15.5*  HGB 10.7* 10.9* 10.3* 10.5* 9.6* 10.0*  HCT 32.8* 33.8* 31.2* 31.0* 29.4* 31.0*  MCV 95.9 97.1 96.3  --  95.8 97.5  PLT 80* 83* 80*  --  86* 135*    BNP: BNP (last 3 results) Recent  Labs    04/09/23 2130  BNP 3,735.5*   CBG: Recent Labs  Lab 04/11/23 1602 04/11/23 2156 04/12/23 0603 04/12/23 1159 04/12/23 1658  GLUCAP 99 93 77 104* 126*     Imaging   Cardiac studies reviewed:  RHC 9/24: Critical ostial LAD/LCx disease with 90-95% stenosis.  Moderate proximal RCA and severe RPAV disease. Severely elevated left and right heart filling pressures. Severe pulmonary hypertension. Normal Fick cardiac output/index, likely elevated by AV fistula. RA 17, PA 80/30 (47), PCWP 30, Fick CO/CI 7.2/3.3. TD CO/CI 5.5/2.6 Echo 9/24: EF 20-25%, LV GHK, mod reduced RV, mod-sev pulmonary hypertension (RVSP 55-60), LA mildly dilated, small pericardial effusion, moderate MR, mod-severe TR, severe low-flow/low-gradient AV stenosis.  Echo 12/23: EF 55-60%, GIIDD   Medications:     Current Medications:  amoxicillin-clavulanate  1 tablet Oral Q12H   aspirin EC  81 mg Oral Daily   atorvastatin  80 mg Oral Daily   Chlorhexidine Gluconate Cloth  6 each Topical Daily   Chlorhexidine Gluconate Cloth  6 each Topical Q0600   multivitamin  1 tablet Per Tube BID   polyethylene glycol  17 g Oral Daily    Infusions:   prismasol BGK 4/2.5 600 mL/hr at 04/14/23 0649    prismasol BGK 4/2.5 400 mL/hr at 04/13/23 2329   sodium chloride     anticoagulant sodium citrate     heparin 1,800 Units/hr (04/14/23 1100)   norepinephrine (LEVOPHED) Adult infusion 10 mcg/min (04/14/23 1100)   prismasol BGK 4/2.5 1,000 mL/hr at 04/14/23 0930      Patient Profile   Jonathan Mcgrath is a 82 y.o. 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 AHF team to see for optimization prior to high-risk PCI with Impella support.   Assessment/Plan  Cardiogenic shock w/ biventricular failure - Echo 12/23: EF 55-60%, GIIDD - Echo 9/24: EF 20-25%, LV GHK, mod-sev pulmonary hypertension (RVSP 55-60), LA mildly dilated, small pericardial  effusion, moderate MR, mod-severe TR, severe low-flow/low-gradient AV stenosis.  -Right heart cath on 04/06/2023 with RA 17, PA 80/30 (47), PCWP 30 mmHg.  Cardiac index 2.6 L/min/m by thermal dilution -Net -1 L over the past 24 hours.  JVP remains elevated to the angle of the jaw.  On Levophed 10 mcg with blood pressure 120s over 50.  Increasing ectopy with frequent bigeminy on  telemetry.  Will start amiodarone 60 mg/h without bolus.  Start low-dose vasopressin.  After this will start to pull at 75 cc/h net negative.  I suspect he will need several liters off prior to PCI on Monday. -CVP and mixed venous pending.  Will adjust fluid rate removal accordingly.  NSTEMI, CAD - [facility] 9/24: Critical ostial LAD/LCx disease with 90-95% stenosis.  Moderate proximal RCA and severe RPAV disease.  - HsTrop 2.6K>3.4K>5K>7K>9K  - Plan for high risk PCI with Impella support - If unable to pull fluid with CRRT (optimize prior to PCI) may need impella placed over the weekend   - Continue heparin gtt - Continue ASA/statin - Denies CP  Aortic valve stenosis - Echo 9/24: severe low-flow/low-gradient AV stenosis. - May be candidate for high risk TAVR, plan to revisit post PCI - Per TCTS not surgical candidate - avoid hypotension  ESRD on HD - Per nephrology, now on CRRT as he did not tolerate iHD - Would try to pull -50/hr - Continue norepi   HTN - BP currently soft on norepi - avoid hypotension  Hx aortitis - on chronic abx - s/p bilateral iliac stenting 19'  Hx stroke - Continue ASA/stain  Colon CA - s/p hemicolectomy  GOC - DNR - Palliative care team following   Length of Stay: 4  Hurschel Paynter, DO  04/14/2023, 12:49 PM  Advanced Heart Failure Team Pager [phone] (M-F; 7a - 5p)  Please contact [facility] for night-coverage after hours (4p -7a ) and weekends on [email or web address]   CRITICAL CARE Performed by: Dorthula Nettles   Total critical care time: 42 minutes  Critical care  time was exclusive of separately billable procedures and treating other patients.  Critical care was necessary to treat or prevent imminent or life-threatening deterioration.  Critical care was time spent personally by me on the following activities: development of treatment plan with patient and/or surrogate as well as nursing, discussions with consultants, evaluation of patient's response to treatment, examination of patient, obtaining history from patient or surrogate, ordering and performing treatments and interventions, ordering and review of laboratory studies, ordering and review of radiographic studies, pulse oximetry and re-evaluation of patient's condition.

## 2023-04-14 NOTE — Progress Notes (Addendum)
NAME:  Jonathan Mcgrath, MRN:  [id number], DOB:  1940-11-25, LOS: 4 ADMISSION DATE:  04/13/2023, CONSULTATION DATE:  13-Apr-2023 REFERRING MD:  End - [facility] CHIEF COMPLAINT:  Weakness, NSTEMI  History of Present Illness:  82 year old man admitted to [facility] CVICU 04-13-23 as a transfer from [facility] for NSTEMI and low flow state. PMHx significant for HTN, CHF (Echo 04-13-2023 EF 20-25%), aortitis (on Augmentin indefinitely, reportedly has not taken this for months), CVA, T2DM, ESRD (on HD MWF via LUE AVF), colon CA s/p hemicolectomy (2019).   Patient was initially admitted to [facility] 9/23 with weakness x 1 week. He was found to have NSTEMI and acute HFrEF c/b low flow/low gradient AS. He was started on Heparin infusion (of note, history of vitreous hemorrhage of eyes 3 months ago; risks/benefits of heparin were apparently discussed with ophthalmology and it was felt benefit outweighed risk). Plan was for volume removal via HD given his ESRD status.  Echo 2023-04-13 demonstrated EF 20-25% with global hypokinesis and septal dyskinesis related to LBBB, mildly dilated RV with mod reduced fx, mod to severe PAH, small pericardial effusion, AV severely thickened with TR, mod to severe TR and mod MR. Patient was taken for [facility] 04-13-23 which demonstrated critical ostial LAD/LCx disease with 90-95%, mod proximal RCA and severe RPAV disease, severely elevated L and R filling pressures, severe PAH. HD was attempted 13-Apr-2023, however due to hypotension patient was only able to tolerate 2L fluid removal prior to cessation.  Decision to transfer patient to [facility] for further Cardiology input, TCTS consultation and possible initiation of CRRT initiation. PCCM consulted 04-13-23 for assistance with management.  Pertinent Medical History:   Past Medical History:  Diagnosis Date   Cancer (HCC) 2019   colon   Chronic kidney disease    Diabetes mellitus without complication (HCC)    diet controlled   Hemodialysis access site with arteriovenous graft (HCC)     Hypertension    Infection due to Clostridium septicum (HCC) 08/27/2018   Renal insufficiency    Pt on dialysis x 3 years and receives every Monday, Wednesday and Friday.    Significant Hospital Events: Including procedures, antibiotic start and stop dates in addition to other pertinent events   9/23 Admit to [facility] 04/13/2023 L/RHC with findings of critical ostial LAD/LCx disease with 90-95%, mod proximal RCA and severe RPAV disease, severely elevated L and R filling pressures, severe PAH. Transfer to [facility] CVICU. 9/25 DNR, aggressive medical care desired. iHD trial 9/26 palliative consult. A lot of back and forth by pt re how he wants to proceed and ultimately he has decided to proceed with offered high risk cardiac intervention + requisite optimization preceding    Interim History / Subjective:  Has some constipation today. Mild nausea when trying to eat.  Escalating norepinephrine requirements overnight, up to 35mcg/ min.  Objective:  Blood pressure 104/61, pulse 81, temperature 98 F (36.7 C), temperature source Oral, resp. rate 20, height 6\' 1"  (1.854 m), weight 91.3 kg, SpO2 96%.        Intake/Output Summary (Last 24 hours) at 04/14/2023 1105 Last data filed at 04/14/2023 1000 Gross per 24 hour  Intake 798.66 ml  Output 2002.4 ml  Net -1203.74 ml   Filed Weights   04/11/23 1415 04/13/23 0500 04/14/23 0500  Weight: 89.1 kg 91.5 kg 91.3 kg    Physical Examination: General: elderly man lying in bed in NAD HEENT: [place]/AT, eyes anicteric  Neuro: awake, alert, moving extremities, answering questions appropriately. CV: 5/6  systolic murmur across precordium, irreg rhythm, frequent ectopy PULM: breathing comfortably on [place] GI: soft, NT  Extremities: fistula LUE Skin: no diffuse rashes or diaphoresis  BUN 28, Cr 3.84 on cRRT WBC 15.5 H/H 10/31 Platelets 135 CXR personally reviewed> continued vascular congestion. Cardiomegaly. LIJ CVC.   9/23 blood culture: NG, final  I/O -1.2L  overnight, -700cc for admission   Labs/imaging personally reviewed:  Echo 9/24 > EF 20-25% with global HK and septal dyskinesis related to LBBB, mildly dilated RV with mod reduced fx, mod to severe PAH, small pericardial effusion, AV severely thickened with TR, mod to severe TR and mod MR. [facility] 9/24 > critical ostial LAD/LCx disease with 90-95%, mod proximal RCA and severe RPAV disease, severely elevated L and R filling pressures, severe PAH  Assessment & Plan:  Acute HFrEF; had preserved EF in 06/2022 Biventricular heart failure Severe PH, WHO group 2 3-vessel severe CAD NSTEMI Severe AS; low flow state Mod-Sev TR Mod MR Hx HTN Frequent ectopy, at risk for malignant arrhythmias -con't norepi to maintain MAP >65 -adding vasopressin to limit NE increase with ectopy -adding amiodarone -monitor electrolytes and replete as needed -needs to be optimized before LHC; planning repeat RHC before additional interventions -anticipate he may need balloon angioplasty with later reconsideration for TAVR  ESRD on HD -CRRT per nephrology, pulling -50cc/h -renally dose meds, avoid nephrotoxic meds  Acute resp failure w hypoxia  -Pulmonary hygiene -mobility limited with CRRT and significant cardiac limitations  Hx aortitis Hx clostridium septicum bacteremia  History of noncompliance with his chronic suppressive antibiotics -con't chronic augmentin    Hx aortic pseudoaneurysm s/p stents -- aorta, common iliac R and L iliac, external iliac  -ASA, statin  -may need vascular surgery follow up if he requires mechanical cardiac support  Chronic anemia Chronic thrombocytopenia > improving -monitor, transfuse for Hb <7 or hemodynamically significant bleeding  History of CVA - aspirin, statin  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 -zofran  PRN -would not be aggressive about enteral nutrition yet  DNR status PTA? Goals of care -He indicated DNR status 9/25, cards d/w him that he would need to be full code for high-risk intervention. Appropriate if he is going to pursue high risk interventions to remain full code, with understanding that his family would make decisions for him post-procedurally if he would be disabled in a long-term fashion.   Best practice (evaluated daily):  Per Primary Team  Labs   CBC: Recent Labs  Lab 03/21/2023 2103 04/11/23 0248 04/12/23 0310 04/12/23 0753 04/13/23 0407 04/14/23 0405  WBC 8.4 9.5 10.4  --  8.8 15.5*  HGB 10.7* 10.9* 10.3* 10.5* 9.6* 10.0*  HCT 32.8* 33.8* 31.2* 31.0* 29.4* 31.0*  MCV 95.9 97.1 96.3  --  95.8 97.5  PLT 80* 83* 80*  --  86* 135*   Basic Metabolic Panel: Recent Labs  Lab 04/14/2023 2103 04/11/23 0248 04/12/23 0310 04/12/23 0753 04/13/23 0407 04/13/23 1726 04/13/23 2011 04/14/23 0405  NA 139 136 137 135 135 135  --  135  K 5.3* 5.5* 4.1 4.0 4.1 4.6  --  4.5  CL 100 97* 98  --  98 100  --  100  CO2 26 24 27   --  27 25  --  24  GLUCOSE 84 97 78  --  91 141*  --  132*  BUN 47* 52* 37*  --  41* 31*  --  28*  CREATININE 6.87* 7.05* 5.90*  --  5.97* 4.28*  --  3.84*  CALCIUM 8.7* 8.6* 8.3*  --  8.2* 8.5*  --  8.8*  MG 2.0 2.1  --   --  1.8  --  2.2 2.3  PHOS 5.9* 6.2*  --   --  4.0 3.0  --  2.9   GFR: Estimated Creatinine Clearance: 16.8 mL/min (A) (by C-G formula based on SCr of 3.84 mg/dL (H)). Recent Labs  Lab 04/09/23 2130 04/02/2023 0002 03/26/2023 0208 04/04/2023 1638 04/04/2023 2103 04/11/23 0248 04/12/23 0310 04/13/23 0407 04/14/23 0405  WBC 8.7  --    < >  --    < > 9.5 10.4 8.8 15.5*  LATICACIDVEN 2.2* 1.2  --  1.3  --   --   --   --   --    < > = values in this interval not displayed.   Liver Function Tests: Recent Labs  Lab 04/09/23 2130 04/12/2023 2103 04/13/23 0407 04/13/23 0908 04/13/23 1726 04/14/23 0405  AST 36 75*  --  28  --    --   ALT 33 54*  --  35  --   --   ALKPHOS 72 74  --  61  --   --   BILITOT 1.1 1.1  --  0.8  --   --   PROT 5.6* 5.8*  --  5.4*  --   --   ALBUMIN 3.3* 3.2* 3.0* 3.0* 3.1* 3.0*   No results for input(s): "LIPASE", "AMYLASE" in the last 168 hours. No results for input(s): "AMMONIA" in the last 168 hours.  ABG:    Component Value Date/Time   PHART 7.434 04/12/2023 0753   PCO2ART 40.3 04/12/2023 0753   PO2ART 67 (L) 04/12/2023 0753   HCO3 27.0 04/12/2023 0753   TCO2 28 04/12/2023 0753   O2SAT 94 04/12/2023 0753    Coagulation Profile: Recent Labs  Lab 04/09/23 2130 03/28/2023 2103  INR 1.3* 1.3*  HbA1C: Hemoglobin A1C  Date/Time Value Ref Range Status  06/04/2013 05:45 AM 5.9 4.2 - 6.3 % Final    Comment:    The American Diabetes Association recommends that a primary goal of therapy should be <7% and that physicians should reevaluate the treatment regimen in patients with HbA1c values consistently >8%.   01/27/2013 11:42 AM 7.5 (H) 4.2 - 6.3 % Final    Comment:    The American Diabetes Association recommends that a primary goal of therapy should be <7% and that physicians should reevaluate the treatment regimen in patients with HbA1c values consistently >8%.    Hgb A1c MFr Bld  Date/Time Value Ref Range Status  03/20/2023 02:08 AM 5.8 (H) 4.8 - 5.6 % Final    Comment:    (NOTE) Pre diabetes:          5.7%-6.4%  Diabetes:              >6.4%  Glycemic control for   <7.0% adults with diabetes   07/12/2022 11:58 PM 5.8 (H) 4.8 - 5.6 % Final    Comment:    (NOTE)         Prediabetes: 5.7 - 6.4         Diabetes: >6.4         Glycemic control for adults with diabetes: <7.0    CBG: Recent Labs  Lab 04/11/23 1602 04/11/23 2156 04/12/23 0603 04/12/23  1159 04/12/23 1658  GLUCAP 99 93 77 104* 126*     This patient is critically ill with multiple organ system failure which requires frequent high complexity decision making, assessment, support, evaluation, and  titration of therapies. This was completed through the application of advanced monitoring technologies and extensive interpretation of multiple databases. During this encounter critical care time was devoted to patient care services described in this note for 36 minutes.  Steffanie Dunn, DO 04/14/23 3:07 PM [facility]  For contact information, see Amion. If no response to pager, please call PCCM consult pager. After hours, 7PM- 7AM, please call Elink.

## 2023-04-14 NOTE — Progress Notes (Addendum)
ANTICOAGULATION CONSULT NOTE  Pharmacy Consult for heparin gtt Indication: chest pain/ACS  No Known Allergies  Patient Measurements: Height: 6\' 1"  (185.4 cm) Weight: 91.5 kg (201 lb 11.5 oz) IBW/kg (Calculated) : 79.9 Heparin Dosing Weight: 90.7 kg  Vital Signs: Temp: 98.7 F (37.1 C) (09/27 1900) Temp Source: Axillary (09/27 1900) Pulse Rate: 83 (09/28 0415)  Labs: Recent Labs    04/12/23 0310 04/12/23 0732 04/12/23 0753 04/12/23 1501 04/13/23 0407 04/13/23 0908 04/13/23 1726 04/13/23 1853 04/14/23 0405  HGB 10.3*  --  10.5*  --  9.6*  --   --   --  10.0*  HCT 31.2*  --  31.0*  --  29.4*  --   --   --  31.0*  PLT 80*  --   --   --  86*  --   --   --  135*  HEPARINUNFRC  --    < >  --    < > >1.10* 0.32  --  0.34 0.29*  CREATININE 5.90*  --   --   --  5.97*  --  4.28*  --  3.84*   < > = values in this interval not displayed.    Estimated Creatinine Clearance: 16.8 mL/min (A) (by C-G formula based on SCr of 3.84 mg/dL (H)).   Medical History: Past Medical History:  Diagnosis Date   Cancer (HCC) 2019   colon   Chronic kidney disease    Diabetes mellitus without complication (HCC)    diet controlled   Hemodialysis access site with arteriovenous graft (HCC)    Hypertension    Infection due to Clostridium septicum (HCC) 08/27/2018   Renal insufficiency    Pt on dialysis x 3 years and receives every Monday, Wednesday and Friday.    Assessment: 82 yo M with NSTEMI taken to cath on 9/24 and found to have extensive disease. He was transferred to [facility] for definitive management. Pharmacy consulted to start heparin infusion 8hr after sheath pull.   Morning heparin level was > 1.1, likely lab error.  Repeat level returned at 0.32 and is therapeutic with heparin running at 1650 units/hr. Hgb (9.6) and PLTs (86) are stable. No signs of bleeding.   9/28 AM update: daily heparin level 0.29 is subtherapeutic on 1650 units/hr.  No infusion issues or bleeding per RN. Cbc  shows Hgb low, stable, plts improved 86 > 135  Goal of Therapy:  Heparin level 0.3-0.7 units/ml Monitor platelets by anticoagulation protocol: Yes  Plan:  Increase heparin drip to 1800 units/hr 8h heparin level Monitor daily heparin level and CBC Monitor signs and symptoms of bleeding  Arabella Merles, PharmD. Clinical Pharmacist 04/14/2023 5:04 AM

## 2023-04-14 NOTE — Progress Notes (Signed)
[facility] NEPHROLOGY PROGRESS NOTE  Assessment/ Plan: Pt is a 82 y.o. yo male  with past medical history significant for hypertension, DM,  CHF with EF of 20 to 25%, stroke, ESRD on HD MWF, colon cancer status post hemicolectomy who was transferred from [facility] for an NSTEMI, seen as a consultation for the management of ESRD.   # Acute on chronic CHF with EF of 20% CAD/NSTEMI/severe AS: The patient is currently on IV heparin.  Cardiology, PCCM is following. UF with CRRT.   # ESRD: MWF at [facility]: Patient received conventional HD on 9/25 with 500 cc complicated by IDH.  After GOC discussion, CRRT started on 9/26. UF as tolerated, all 4K bath, heparin for anticoagulation.  Continue CRRT at current prescription.   # Hypertension/volume: Issue with intradialytic hypotension.  Monitor BP.   # Anemia of ESRD: Hemoglobin at goal.  No need for ESA.  Try to hold ESA in the setting of CAD.   # CKD-metabolic Bone Disease: DC Auryxia as patient is on CRRT.  Discussed with ICU nurse and multiple family members today.  Subjective: Seen and examined at bedside.  Tolerating CRRT well and feels good.  He denies nausea, vomiting, shortness of breath or chest pain.  Family members including his son was presented with him. Objective Vital signs in last 24 hours: Vitals:   04/14/23 0945 04/14/23 1000 04/14/23 1015 04/14/23 1030  BP:      Pulse: 85 88 81 81  Resp: (!) 21 (!) 21 (!) 21 20  Temp:      TempSrc:      SpO2: 96% 96% 100% 96%  Weight:      Height:       Weight change: -0.2 kg  Intake/Output Summary (Last 24 hours) at 04/14/2023 1120 Last data filed at 04/14/2023 1000 Gross per 24 hour  Intake 798.66 ml  Output 2002.4 ml  Net -1203.74 ml       Labs: RENAL PANEL Recent Labs  Lab 03/24/2023 2103 04/11/23 0248 04/12/23 0310 04/12/23 0753 04/13/23 0407 04/13/23 0908 04/13/23 1726 04/13/23 2011 04/14/23 0405  NA 139 136 137 135 135  --  135  --  135  K 5.3* 5.5*  4.1 4.0 4.1  --  4.6  --  4.5  CL 100 97* 98  --  98  --  100  --  100  CO2 26 24 27   --  27  --  25  --  24  GLUCOSE 84 97 78  --  91  --  141*  --  132*  BUN 47* 52* 37*  --  41*  --  31*  --  28*  CREATININE 6.87* 7.05* 5.90*  --  5.97*  --  4.28*  --  3.84*  CALCIUM 8.7* 8.6* 8.3*  --  8.2*  --  8.5*  --  8.8*  MG 2.0 2.1  --   --  1.8  --   --  2.2 2.3  PHOS 5.9* 6.2*  --   --  4.0  --  3.0  --  2.9  ALBUMIN 3.2*  --   --   --  3.0* 3.0* 3.1*  --  3.0*    Liver Function Tests: Recent Labs  Lab 04/09/23 2130 04/03/2023 2103 04/13/23 0407 04/13/23 0908 04/13/23 1726 04/14/23 0405  AST 36 75*  --  28  --   --   ALT 33 54*  --  35  --   --  ALKPHOS 72 74  --  61  --   --   BILITOT 1.1 1.1  --  0.8  --   --   PROT 5.6* 5.8*  --  5.4*  --   --   ALBUMIN 3.3* 3.2*   < > 3.0* 3.1* 3.0*   < > = values in this interval not displayed.   No results for input(s): "LIPASE", "AMYLASE" in the last 168 hours. No results for input(s): "AMMONIA" in the last 168 hours. CBC: Recent Labs    04/11/23 0248 04/12/23 0310 04/12/23 0753 04/13/23 0407 04/14/23 0405  HGB 10.9* 10.3* 10.5* 9.6* 10.0*  MCV 97.1 96.3  --  95.8 97.5    Cardiac Enzymes: No results for input(s): "CKTOTAL", "CKMB", "CKMBINDEX", "TROPONINI" in the last 168 hours. CBG: Recent Labs  Lab 04/11/23 1602 04/11/23 2156 04/12/23 0603 04/12/23 1159 04/12/23 1658  GLUCAP 99 93 77 104* 126*    Iron Studies: No results for input(s): "IRON", "TIBC", "TRANSFERRIN", "FERRITIN" in the last 72 hours. Studies/Results: DG CHEST PORT 1 VIEW  Result Date: 04/12/2023 CLINICAL DATA:  Central line placement. EXAM: PORTABLE CHEST 1 VIEW COMPARISON:  04/09/2023. FINDINGS: The heart is enlarged and the mediastinal contour stable. There is atherosclerotic calcification of the aorta. The pulmonary vasculature is distended. Interstitial and airspace opacities are noted in the lungs bilaterally. No effusion or pneumothorax. A left  internal jugular central venous catheter terminates at the cavoatrial junction. Left subclavian/axillary stent is noted. No acute osseous abnormality. IMPRESSION: 1. Cardiomegaly with pulmonary vascular congestion. 2. Interstitial and airspace opacities in the lungs bilaterally, possible edema versus multifocal pneumonia. 3. Left internal jugular central venous catheter terminates at the cavoatrial junction. Electronically Signed   By: Thornell Sartorius M.D.   On: 04/12/2023 22:20    Medications: Infusions:   prismasol BGK 4/2.5 600 mL/hr at 04/14/23 0649    prismasol BGK 4/2.5 400 mL/hr at 04/13/23 2329   sodium chloride     anticoagulant sodium citrate     heparin 1,800 Units/hr (04/14/23 1000)   norepinephrine (LEVOPHED) Adult infusion 10 mcg/min (04/14/23 1000)   prismasol BGK 4/2.5 1,000 mL/hr at 04/13/23 2248    Scheduled Medications:  amoxicillin-clavulanate  1 tablet Oral Q12H   aspirin EC  81 mg Oral Daily   atorvastatin  80 mg Oral Daily   Chlorhexidine Gluconate Cloth  6 each Topical Daily   Chlorhexidine Gluconate Cloth  6 each Topical Q0600   multivitamin  1 tablet Per Tube BID    have reviewed scheduled and prn medications.  Physical Exam: General:NAD, comfortable Heart:RRR, s1s2 nl Lungs:clear b/l, no crackle Abdomen:soft, Non-tender, non-distended Extremities:No edema Dialysis Access: AV fistula with good thrill and bruit.  Left IJ temporary HD catheter placed by PCCM on 9/26.  Cameka Rae Prasad Hawkin Charo 04/14/2023,11:20 AM  LOS: 4 days

## 2023-04-14 NOTE — Plan of Care (Signed)
Pt progressing well. Tolerating CRRT.

## 2023-04-15 DIAGNOSIS — J9601 Acute respiratory failure with hypoxia: Secondary | ICD-10-CM

## 2023-04-15 DIAGNOSIS — J810 Acute pulmonary edema: Secondary | ICD-10-CM

## 2023-04-15 DIAGNOSIS — I214 Non-ST elevation (NSTEMI) myocardial infarction: Secondary | ICD-10-CM | POA: Diagnosis not present

## 2023-04-15 DIAGNOSIS — E8779 Other fluid overload: Secondary | ICD-10-CM

## 2023-04-15 DIAGNOSIS — N186 End stage renal disease: Secondary | ICD-10-CM | POA: Diagnosis not present

## 2023-04-15 LAB — RENAL FUNCTION PANEL
Albumin: 3.1 g/dL — ABNORMAL LOW (ref 3.5–5.0)
Albumin: 3.2 g/dL — ABNORMAL LOW (ref 3.5–5.0)
Anion gap: 13 (ref 5–15)
Anion gap: 9 (ref 5–15)
BUN: 20 mg/dL (ref 8–23)
BUN: 18 mg/dL (ref 8–23)
CO2: 21 mmol/L — ABNORMAL LOW (ref 22–32)
CO2: 25 mmol/L (ref 22–32)
Calcium: 9 mg/dL (ref 8.9–10.3)
Calcium: 8.9 mg/dL (ref 8.9–10.3)
Chloride: 99 mmol/L (ref 98–111)
Chloride: 100 mmol/L (ref 98–111)
Creatinine, Ser: 2.75 mg/dL — ABNORMAL HIGH (ref 0.61–1.24)
Creatinine, Ser: 2.77 mg/dL — ABNORMAL HIGH (ref 0.61–1.24)
GFR, Estimated: 22 mL/min — ABNORMAL LOW (ref >60)
GFR, Estimated: 22 mL/min — ABNORMAL LOW (ref >60)
Glucose, Bld: 168 mg/dL — ABNORMAL HIGH (ref 70–99)
Glucose, Bld: 184 mg/dL — ABNORMAL HIGH (ref 70–99)
Phosphorus: 3 mg/dL (ref 2.5–4.6)
Phosphorus: 2.9 mg/dL (ref 2.5–4.6)
Potassium: 4.8 mmol/L (ref 3.5–5.1)
Potassium: 4.7 mmol/L (ref 3.5–5.1)
Sodium: 133 mmol/L — ABNORMAL LOW (ref 135–145)
Sodium: 134 mmol/L — ABNORMAL LOW (ref 135–145)

## 2023-04-15 LAB — GLUCOSE, CAPILLARY
Glucose-Capillary: 167 mg/dL — ABNORMAL HIGH (ref 70–99)
Glucose-Capillary: 133 mg/dL — ABNORMAL HIGH (ref 70–99)
Glucose-Capillary: 190 mg/dL — ABNORMAL HIGH (ref 70–99)
Glucose-Capillary: 195 mg/dL — ABNORMAL HIGH (ref 70–99)

## 2023-04-15 LAB — CBC
HCT: 31 % — ABNORMAL LOW (ref 39.0–52.0)
Hemoglobin: 10.1 g/dL — ABNORMAL LOW (ref 13.0–17.0)
MCH: 32.4 pg (ref 26.0–34.0)
MCHC: 32.6 g/dL (ref 30.0–36.0)
MCV: 99.4 fL (ref 80.0–100.0)
Platelets: 112 10*3/uL — ABNORMAL LOW (ref 150–400)
RBC: 3.12 MIL/uL — ABNORMAL LOW (ref 4.22–5.81)
RDW: 15.5 % (ref 11.5–15.5)
WBC: 11.5 10*3/uL — ABNORMAL HIGH (ref 4.0–10.5)
nRBC: 2.4 % — ABNORMAL HIGH (ref 0.0–0.2)

## 2023-04-15 LAB — MAGNESIUM: Magnesium: 2.4 mg/dL (ref 1.7–2.4)

## 2023-04-15 LAB — HEPARIN LEVEL (UNFRACTIONATED): Heparin Unfractionated: 0.43 [IU]/mL (ref 0.30–0.70)

## 2023-04-15 MED ORDER — PANTOPRAZOLE SODIUM 40 MG PO TBEC
40.0000 mg | DELAYED_RELEASE_TABLET | Freq: Every day | ORAL | Status: DC
  Administered 2023-04-15 – 2023-04-17 (×3): 40 mg via ORAL
  Filled 2023-04-15 (×3): qty 1

## 2023-04-15 MED ORDER — TICAGRELOR 90 MG PO TABS
90.0000 mg | ORAL_TABLET | Freq: Two times a day (BID) | ORAL | Status: DC
  Administered 2023-04-16 – 2023-04-18 (×3): 90 mg via ORAL
  Filled 2023-04-15 (×5): qty 1

## 2023-04-15 MED ORDER — TICAGRELOR 90 MG PO TABS
180.0000 mg | ORAL_TABLET | Freq: Once | ORAL | Status: AC
  Administered 2023-04-15: 180 mg via ORAL
  Filled 2023-04-15: qty 2

## 2023-04-15 NOTE — Progress Notes (Signed)
Advanced Heart Failure Team Progress Note   Primary Physician: Estell Harpin, MD PCP-Cardiologist:  Yvonne Kendall, MD  Reason for Consultation: Optimization prior to high-risk PCI with Impella support  HPI:     Jonathan Mcgrath is a 82 y.o. 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  Since being at [facility] he was unable to be diuresed with iHD so he was started on CRRT. The family and team have had multiple GOC discussions to clearly define patients wishes. At this time he wished to proceed to go forward with high risk interventions but would like to be DNR/DNI otherwise. TCTS evaluated deemed patient not a candidate for surgery. Plan is now for high-risk PCI with impella support. Plan to revisit TAVR after PCI complete.   - Remains volume overloaded with CVP > 15.  - No complaints otherwise; significant improvement in ectopy. Hemodynamically stable on low dose levophed & vasopressin 0.04.  Objective:    Vital Signs:   Temp:  [97.7 F (36.5 C)-98.7 F (37.1 C)] 98.7 F (37.1 C) (09/29 0800) Pulse Rate:  [63-88] 67 (09/29 1000) Resp:  [0-36] 19 (09/29 1000) BP: (100-116)/(50-65) 104/50 (09/29 1000) SpO2:  [85 %-100 %] 97 % (09/29 1000) Arterial Line BP: (93-135)/(35-79) 107/41 (09/29 1000) Weight:  [88.6 kg] 88.6 kg (09/29 0600) Last BM Date : 04/15/23  Weight change: Filed Weights   04/13/23 0500 04/14/23 0500 04/15/23 0600  Weight: 91.5 kg 91.3 kg 88.6 kg   Intake/Output:   Intake/Output Summary (Last 24 hours) at 04/15/2023 1044 Last data filed at 04/15/2023 1000 Gross per 24 hour  Intake 1294.62 ml   Output 2850 ml  Net -1555.38 ml    Physical Exam  General:  elderly appearing.  No respiratory difficulty HEENT: normal Neck: supple. JVD elevated. Carotids 2+ bilat; no bruits. No lymphadenopathy or thyromegaly appreciated. LIJ HD cath Cor: PMI nondisplaced. Regular rate & rhythm. No rubs, gallops or murmurs. Lungs: clear Abdomen: soft, nontender, nondistended. No hepatosplenomegaly. No bruits or masses. Good bowel sounds. Extremities: no cyanosis, clubbing, rash, edema. LUE fistula +bruit/thrill Neuro: alert & oriented x 3, cranial nerves grossly intact. moves all 4 extremities w/o difficulty. Affect pleasant.   Telemetry   NSR 80s (Personally reviewed)    EKG    NSR 1st degree AVB, LBBB 80s   Labs   Basic Metabolic Panel: Recent Labs  Lab 04/11/23 0248 04/12/23 0310 04/13/23 0407 04/13/23 1726 04/13/23 2011 04/14/23 0405 04/14/23 1739 04/15/23 0406  NA 136   < > 135 135  --  135 134* 133*  K 5.5*   < > 4.1 4.6  --  4.5 4.8 4.8  CL 97*   < > 98 100  --  100 100 99  CO2 24   < > 27 25  --  24 23 21*  GLUCOSE 97   < > 91 141*  --  132* 212* 168*  BUN 52*   < > 41* 31*  --  28* 24* 20  CREATININE 7.05*   < > 5.97* 4.28*  --  3.84* 3.21* 2.75*  CALCIUM 8.6*   < > 8.2* 8.5*  --  8.8* 8.8*  9.0  MG 2.1  --  1.8  --  2.2 2.3  --  2.4  PHOS 6.2*  --  4.0 3.0  --  2.9 2.7 3.0   < > = values in this interval not displayed.    Liver Function Tests: Recent Labs  Lab 04/09/23 2130 04/12/2023 2103 04/13/23 0407 04/13/23 0908 04/13/23 1726 04/14/23 0405 04/14/23 1739 04/15/23 0406  AST 36 75*  --  28  --   --   --   --   ALT 33 54*  --  35  --   --   --   --   ALKPHOS 72 74  --  61  --   --   --   --   BILITOT 1.1 1.1  --  0.8  --   --   --   --   PROT 5.6* 5.8*  --  5.4*  --   --   --   --   ALBUMIN 3.3* 3.2*   < > 3.0* 3.1* 3.0* 3.0* 3.1*   < > = values in this interval not displayed.   CBC: Recent Labs  Lab 04/11/23 0248 04/12/23 0310 04/12/23 0753  04/13/23 0407 04/14/23 0405 04/15/23 0406  WBC 9.5 10.4  --  8.8 15.5* 11.5*  HGB 10.9* 10.3* 10.5* 9.6* 10.0* 10.1*  HCT 33.8* 31.2* 31.0* 29.4* 31.0* 31.0*  MCV 97.1 96.3  --  95.8 97.5 99.4  PLT 83* 80*  --  86* 135* 112*    BNP: BNP (last 3 results) Recent Labs    04/09/23 2130  BNP 3,735.5*   CBG: Recent Labs  Lab 04/12/23 0603 04/12/23 1159 04/12/23 1658 04/14/23 2122 04/15/23 0645  GLUCAP 77 104* 126* 174* 167*     Imaging   Cardiac studies reviewed:  RHC 9/24: Critical ostial LAD/LCx disease with 90-95% stenosis.  Moderate proximal RCA and severe RPAV disease. Severely elevated left and right heart filling pressures. Severe pulmonary hypertension. Normal Fick cardiac output/index, likely elevated by AV fistula. RA 17, PA 80/30 (47), PCWP 30, Fick CO/CI 7.2/3.3. TD CO/CI 5.5/2.6 Echo 9/24: EF 20-25%, LV GHK, mod reduced RV, mod-sev pulmonary hypertension (RVSP 55-60), LA mildly dilated, small pericardial effusion, moderate MR, mod-severe TR, severe low-flow/low-gradient AV stenosis.  Echo 12/23: EF 55-60%, GIIDD   Medications:     Current Medications:  amoxicillin-clavulanate  1 tablet Oral Q12H   aspirin EC  81 mg Oral Daily   atorvastatin  80 mg Oral Daily   Chlorhexidine Gluconate Cloth  6 each Topical Daily   Chlorhexidine Gluconate Cloth  6 each Topical Q0600   insulin aspart  0-5 Units Subcutaneous QHS   insulin aspart  0-6 Units Subcutaneous TID WC   multivitamin  1 tablet Per Tube BID   polyethylene glycol  17 g Oral Daily    Infusions:   prismasol BGK 4/2.5 600 mL/hr at 04/15/23 0800    prismasol BGK 4/2.5 400 mL/hr at 04/15/23 0113   sodium chloride     amiodarone 30 mg/hr (04/15/23 1037)   anticoagulant sodium citrate     heparin 1,800 Units/hr (04/15/23 1000)   norepinephrine (LEVOPHED) Adult infusion 8.5 mcg/min (04/15/23 1000)   prismasol BGK 4/2.5 1,000 mL/hr at 04/15/23 0511   vasopressin 0.03 Units/min (04/15/23 1000)       Patient Profile   Jonathan Mcgrath is a 82 y.o. 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 AHF  team to see for optimization prior to high-risk PCI with Impella support.   Assessment/Plan  Cardiogenic shock w/ biventricular failure - Echo 12/23: EF 55-60%, GIIDD - Echo 9/24: EF 20-25%, LV GHK, mod-sev pulmonary hypertension (RVSP 55-60), LA mildly dilated, small pericardial effusion, moderate MR, mod-severe TR, severe low-flow/low-gradient AV stenosis.  -Right heart cath on 04/06/2023 with RA 17, PA 80/30 (47), PCWP 30 mmHg.  Cardiac index 2.6 L/min/m by thermal dilution - Negative 1.5L over the past 24h; he was having significant ectopy yesterday which has now resolved with addition of amiodarone and vasopressin allowing for lower doses of levophed. Will increase net negative fluid removal to 100-150cc/hr as his vitals/ectopy have improved. Target net negative 4L today.  - levophed 5-32mcg; vaso 0.03. continue amio 30mg /hr.   NSTEMI, CAD - [facility] 9/24: Critical ostial LAD/LCx disease with 90-95% stenosis.  Moderate proximal RCA and severe RPAV disease.  - HsTrop 2.6K>3.4K>5K>7K>9K  - Plan for high risk PCI with Impella support - If unable to pull fluid with CRRT (optimize prior to PCI) may need impella placed over the weekend   - Continue heparin gtt - Continue ASA/statin - Denies CP  Aortic valve stenosis - Echo 9/24: severe low-flow/low-gradient AV stenosis. 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  Critical care was necessary to treat or prevent imminent or life-threatening deterioration.  Critical care was time spent personally by me on the following activities: development of treatment plan with patient and/or surrogate as well as nursing, discussions with consultants, evaluation of patient's response to treatment, examination of patient, obtaining history from patient or surrogate, ordering and performing treatments and interventions, ordering and review of laboratory studies, ordering and review of radiographic studies, pulse oximetry and re-evaluation of patient's condition.

## 2023-04-15 NOTE — Progress Notes (Signed)
ANTICOAGULATION CONSULT NOTE  Pharmacy Consult for heparin gtt Indication: chest pain/ACS  No Known Allergies  Patient Measurements: Height: 6\' 1"  (185.4 cm) Weight: 88.6 kg (195 lb 5.2 oz) IBW/kg (Calculated) : 79.9 Heparin Dosing Weight: 90.7 kg  Vital Signs: Temp: 98.7 F (37.1 C) (09/29 0800) Temp Source: Oral (09/29 0800) BP: 104/50 (09/29 1000) Pulse Rate: 67 (09/29 1000)  Labs: Recent Labs    04/13/23 0407 04/13/23 0908 04/14/23 0405 04/14/23 1739 04/14/23 1740 04/15/23 0406  HGB 9.6*  --  10.0*  --   --  10.1*  HCT 29.4*  --  31.0*  --   --  31.0*  PLT 86*  --  135*  --   --  112*  HEPARINUNFRC >1.10*   < > 0.29*  --  0.49 0.43  CREATININE 5.97*   < > 3.84* 3.21*  --  2.75*   < > = values in this interval not displayed.    Estimated Creatinine Clearance: 23.4 mL/min (A) (by C-G formula based on SCr of 2.75 mg/dL (H)).   Medical History: Past Medical History:  Diagnosis Date   Cancer (HCC) 2019   colon   Chronic kidney disease    Diabetes mellitus without complication (HCC)    diet controlled   Hemodialysis access site with arteriovenous graft (HCC)    Hypertension    Infection due to Clostridium septicum (HCC) 08/27/2018   Renal insufficiency    Pt on dialysis x 3 years and receives every Monday, Wednesday and Friday.    Assessment: 82 yo M with NSTEMI taken to cath on 9/24 and found to have extensive disease. He was transferred to [facility] for definitive management. Pharmacy consulted to start heparin infusion 8hr after sheath pull.   Morning heparin level was > 1.1, likely lab error.  Repeat level returned at 0.32 and is therapeutic with heparin running at 1650 units/hr. Hgb (9.6) and PLTs (86) are stable. No signs of bleeding.   Heparin level 0.43 is therapeutic on 1800 units/hr.  No infusion issues or bleeding per RN. Cbc stable.  Goal of Therapy:  Heparin level 0.3-0.7 units/ml Monitor platelets by anticoagulation protocol: Yes  Plan:   Continue heparin drip at 1800 units/hr 8h heparin level Monitor daily heparin level and CBC Monitor signs and symptoms of bleeding  Wilmer Floor, PharmD PGY2 Cardiology Pharmacy Resident 04/15/2023 10:51 AM

## 2023-04-15 NOTE — Progress Notes (Signed)
[facility] NEPHROLOGY PROGRESS NOTE  Assessment/ Plan: Pt is a 82 y.o. yo male  with past medical history significant for hypertension, DM,  CHF with EF of 20 to 25%, stroke, ESRD on HD MWF, colon cancer status post hemicolectomy who was transferred from [facility] for an NSTEMI, seen as a consultation for the management of ESRD.   # Acute on chronic CHF with EF of 20% CAD/NSTEMI/severe AS: Cardiology team is planning for PCI and Impella.  Manage volume with CRRT.   # ESRD: MWF at [facility]: Patient received conventional HD on 9/25 with 500 cc complicated by IDH.  After GOC discussion, CRRT started on 9/26. UF as tolerated, all 4K bath, heparin for anticoagulation.  Continue CRRT at current prescription.   # Shock/volume: Issue with intradialytic hypotension.  Currently on Levophed, vasopressin.  Monitor BP.   # Anemia of ESRD: Hemoglobin at goal.  No need for ESA.  Try to hold ESA in the setting of CAD.   # CKD-metabolic Bone Disease: DC Auryxia as patient is on CRRT.  Discussed with ICU nurse.  Subjective: Seen and examined at bedside.  Denies nausea, vomiting, chest pain or shortness of breath.  UF around 50 to 100 cc an hour with CRRT, tolerating well.  Currently on Levophed 8.5 mics and vasopressin.  Discussed with ICU nurse.  Objective Vital signs in last 24 hours: Vitals:   04/15/23 0645 04/15/23 0700 04/15/23 0800 04/15/23 0900  BP:  (!) 111/52 (!) 104/50 (!) 107/54  Pulse: 65 69 69 70  Resp: 19 20 15 17   Temp:   98.7 F (37.1 C)   TempSrc:   Oral   SpO2: 98% 98% 94% 97%  Weight:      Height:       Weight change: -2.7 kg  Intake/Output Summary (Last 24 hours) at 04/15/2023 0955 Last data filed at 04/15/2023 0900 Gross per 24 hour  Intake 1270.39 ml  Output 2787 ml  Net -1516.61 ml       Labs: RENAL PANEL Recent Labs  Lab 04/11/23 0248 04/12/23 0310 04/13/23 0407 04/13/23 0908 04/13/23 1726 04/13/23 2011 04/14/23 0405 04/14/23 1739  04/15/23 0406  NA 136   < > 135  --  135  --  135 134* 133*  K 5.5*   < > 4.1  --  4.6  --  4.5 4.8 4.8  CL 97*   < > 98  --  100  --  100 100 99  CO2 24   < > 27  --  25  --  24 23 21*  GLUCOSE 97   < > 91  --  141*  --  132* 212* 168*  BUN 52*   < > 41*  --  31*  --  28* 24* 20  CREATININE 7.05*   < > 5.97*  --  4.28*  --  3.84* 3.21* 2.75*  CALCIUM 8.6*   < > 8.2*  --  8.5*  --  8.8* 8.8* 9.0  MG 2.1  --  1.8  --   --  2.2 2.3  --  2.4  PHOS 6.2*  --  4.0  --  3.0  --  2.9 2.7 3.0  ALBUMIN  --   --  3.0* 3.0* 3.1*  --  3.0* 3.0* 3.1*   < > = values in this interval not displayed.    Liver Function Tests: Recent Labs  Lab 04/09/23 2130 04/26/2023 2103 04/13/23 0407 04/13/23 0908 04/13/23 1726  04/14/23 0405 04/14/23 1739 04/15/23 0406  AST 36 75*  --  28  --   --   --   --   ALT 33 54*  --  35  --   --   --   --   ALKPHOS 72 74  --  61  --   --   --   --   BILITOT 1.1 1.1  --  0.8  --   --   --   --   PROT 5.6* 5.8*  --  5.4*  --   --   --   --   ALBUMIN 3.3* 3.2*   < > 3.0*   < > 3.0* 3.0* 3.1*   < > = values in this interval not displayed.   No results for input(s): "LIPASE", "AMYLASE" in the last 168 hours. No results for input(s): "AMMONIA" in the last 168 hours. CBC: Recent Labs    04/12/23 0310 04/12/23 0753 04/13/23 0407 04/14/23 0405 04/15/23 0406  HGB 10.3* 10.5* 9.6* 10.0* 10.1*  MCV 96.3  --  95.8 97.5 99.4    Cardiac Enzymes: No results for input(s): "CKTOTAL", "CKMB", "CKMBINDEX", "TROPONINI" in the last 168 hours. CBG: Recent Labs  Lab 04/12/23 0603 04/12/23 1159 04/12/23 1658 04/14/23 2122 04/15/23 0645  GLUCAP 77 104* 126* 174* 167*    Iron Studies: No results for input(s): "IRON", "TIBC", "TRANSFERRIN", "FERRITIN" in the last 72 hours. Studies/Results: No results found.  Medications: Infusions:   prismasol BGK 4/2.5 600 mL/hr at 04/15/23 0800    prismasol BGK 4/2.5 400 mL/hr at 04/15/23 0113   sodium chloride     amiodarone 30  mg/hr (04/15/23 0900)   anticoagulant sodium citrate     heparin 1,800 Units/hr (04/15/23 0900)   norepinephrine (LEVOPHED) Adult infusion 8.5 mcg/min (04/15/23 0900)   prismasol BGK 4/2.5 1,000 mL/hr at 04/15/23 0511   vasopressin 0.03 Units/min (04/15/23 0947)    Scheduled Medications:  amoxicillin-clavulanate  1 tablet Oral Q12H   aspirin EC  81 mg Oral Daily   atorvastatin  80 mg Oral Daily   Chlorhexidine Gluconate Cloth  6 each Topical Daily   Chlorhexidine Gluconate Cloth  6 each Topical Q0600   insulin aspart  0-5 Units Subcutaneous QHS   insulin aspart  0-6 Units Subcutaneous TID WC   multivitamin  1 tablet Per Tube BID   polyethylene glycol  17 g Oral Daily    have reviewed scheduled and prn medications.  Physical Exam: General:NAD, comfortable Heart:RRR, s1s2 nl Lungs:clear b/l, no crackle Abdomen:soft, Non-tender, non-distended Extremities:No edema Dialysis Access: AV fistula with good thrill and bruit.  Left IJ temporary HD catheter placed by PCCM on 9/26.  Tashaya Ancrum Prasad Nasira Janusz 04/15/2023,9:55 AM  LOS: 5 days

## 2023-04-15 NOTE — Progress Notes (Signed)
NAME:  Jonathan Mcgrath, MRN:  [id number], DOB:  Feb 15, 1941, LOS: 5 ADMISSION DATE:  04/14/2023, CONSULTATION DATE:  03/24/2023 REFERRING MD:  End - [facility] CHIEF COMPLAINT:  Weakness, NSTEMI  History of Present Illness:  82 year old man admitted to [facility] CVICU 9/24 as a transfer from [facility] for NSTEMI and low flow state. PMHx significant for HTN, CHF (Echo 9/24 EF 20-25%), aortitis (on Augmentin indefinitely, reportedly has not taken this for months), CVA, T2DM, ESRD (on HD MWF via LUE AVF), colon CA s/p hemicolectomy (2019).   Patient was initially admitted to [facility] 9/23 with weakness x 1 week. He was found to have NSTEMI and acute HFrEF c/b low flow/low gradient AS. He was started on Heparin infusion (of note, history of vitreous hemorrhage of eyes 3 months ago; risks/benefits of heparin were apparently discussed with ophthalmology and it was felt benefit outweighed risk). Plan was for volume removal via HD given his ESRD status.  Echo 9/24 demonstrated EF 20-25% with global hypokinesis and septal dyskinesis related to LBBB, mildly dilated RV with mod reduced fx, mod to severe PAH, small pericardial effusion, AV severely thickened with TR, mod to severe TR and mod MR. Patient was taken for [facility] 9/24 which demonstrated critical ostial LAD/LCx disease with 90-95%, mod proximal RCA and severe RPAV disease, severely elevated L and R filling pressures, severe PAH. HD was attempted 9/24, however due to hypotension patient was only able to tolerate 2L fluid removal prior to cessation.  Decision to transfer patient to [facility] for further Cardiology input, TCTS consultation and possible initiation of CRRT initiation. PCCM consulted 9/24 for assistance with management.  Pertinent Medical History:   Past Medical History:  Diagnosis Date   Cancer (HCC) 2019   colon   Chronic kidney disease    Diabetes mellitus without complication (HCC)    diet controlled   Hemodialysis access site with arteriovenous graft (HCC)     Hypertension    Infection due to Clostridium septicum (HCC) 08/27/2018   Renal insufficiency    Pt on dialysis x 3 years and receives every Monday, Wednesday and Friday.    Significant Hospital Events: Including procedures, antibiotic start and stop dates in addition to other pertinent events   9/23 Admit to [facility] 9/24 L/RHC with findings of critical ostial LAD/LCx disease with 90-95%, mod proximal RCA and severe RPAV disease, severely elevated L and R filling pressures, severe PAH. Transfer to [facility] CVICU. 9/25 DNR, aggressive medical care desired. iHD trial 9/26 palliative consult. A lot of back and forth by pt re how he wants to proceed and ultimately he has decided to proceed with offered high risk cardiac intervention + requisite optimization preceding    Interim History / Subjective:  CVP remains elevated. Increase UF this morning.   Objective:  Blood pressure (!) 108/52, pulse 69, temperature 98.7 F (37.1 C), temperature source Oral, resp. rate 18, height 6\' 1"  (1.854 m), weight 88.6 kg, SpO2 91%. CVP:  [13 mmHg-18 mmHg] 18 mmHg      Intake/Output Summary (Last 24 hours) at 04/15/2023 1110 Last data filed at 04/15/2023 1100 Gross per 24 hour  Intake 1318.86 ml  Output 2768.7 ml  Net -1449.84 ml   Filed Weights   04/13/23 0500 04/14/23 0500 04/15/23 0600  Weight: 91.5 kg 91.3 kg 88.6 kg    Physical Examination: General: elderly man sitting up in bed in NAD HEENT: [place]/AT, eyes anicteric Neuro: awake, alert, answering questions appropriately CV: Systolic murmur across precordium, regular rate and rhythm.  Less ectopy on telemetry. PULM: Breathing comfortably on nasal cannula, reduced basilar breath sounds.  Clear anteriorly.  No conversational dyspnea. GI: Soft, nontender Extremities: Fistula left upper extremity, left IJ dialysis catheter Skin: Warm, dry, no diffuse rashes  BUN 20, Cr 2.75 on cRRT WBC 11.5 H/H 10.1/31 Platelets 112  9/23 blood culture: NG,  final  I/O -1.5L overnight, -2.2L for admission   Labs/imaging personally reviewed:  Echo 9/24 > EF 20-25% with global HK and septal dyskinesis related to LBBB, mildly dilated RV with mod reduced fx, mod to severe PAH, small pericardial effusion, AV severely thickened with TR, mod to severe TR and mod MR. [facility] 9/24 > critical ostial LAD/LCx disease with 90-95%, mod proximal RCA and severe RPAV disease, severely elevated L and R filling pressures, severe PAH  Assessment & Plan:  Acute HFrEF; had preserved EF in 06/2022 Biventricular heart failure Severe PH, WHO group 2 3-vessel severe CAD NSTEMI Severe AS; low flow state Mod-Sev TR Mod MR Hx HTN Frequent ectopy, at risk for malignant arrhythmias -Norepinephrine to maintain MAP greater than 65, serial [place] is able to collect, but somewhat limited with limited central access. - Continue vasopressin to limit norepinephrine needed due to ectopy, chest improved - Continue amiodarone, heparin -Monitor electrolytes - Increasing ultrafiltration with CRRT due to increased CVP.  -Tentatively planning for protected left heart cath & Impella tomorrow -anticipate he may need balloon angioplasty with later reconsideration for TAVR  ESRD on HD -Continue CRRT per nephrology, increase ultrafiltration -Renally dose meds and avoid nephrotoxic meds  Acute resp failure with hypoxia  -Pulmonary hygiene - Increase ultrafiltration - Wean supplemental oxygen as able to maintain SpO2 greater than 90%  Hx aortitis Hx clostridium septicum bacteremia  History of noncompliance with his chronic suppressive antibiotics -Continue chronic Augmentin  Hx aortic pseudoaneurysm s/p stents -- aorta, common iliac R and L iliac, external iliac  -Aspirin and statin -Anticipate the need for vascular surgery follow-up after needing MCS  Chronic anemia Chronic thrombocytopenia > improving -Transfuse for hemoglobin less than 8 given concern for subacute cardiac  ischemia - Monitor for signs of bleeding.  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 -Zofran as needed - Would not be too aggressive about enteral nutrition if he is having symptoms which may be an anginal equivalent  DNR status PTA? Goals of care -He indicated DNR status 9/25, cards d/w him that he would need to be full code for high-risk intervention. Appropriate if he is going to pursue high risk interventions to remain full code, with understanding that his family would make decisions for him post-procedurally if he would be disabled in a long-term fashion.    2 family members updated at bedside with Mr. Hassinger during rounds.  Best practice (evaluated daily):  Per Primary Team  Labs   CBC: Recent Labs  Lab 04/11/23 0248 04/12/23 0310 04/12/23 0753 04/13/23 0407 04/14/23 0405 04/15/23 0406  WBC 9.5 10.4  --  8.8 15.5* 11.5*  HGB 10.9* 10.3* 10.5* 9.6* 10.0* 10.1*  HCT 33.8* 31.2* 31.0* 29.4* 31.0* 31.0*  MCV 97.1 96.3  --  95.8 97.5 99.4  PLT 83* 80*  --  86* 135* 112*   Basic Metabolic Panel: Recent Labs  Lab 04/11/23 0248 04/12/23  0310 04/13/23 0407 04/13/23 1726 04/13/23 2011 04/14/23 0405 04/14/23 1739 04/15/23 0406  NA 136   < > 135 135  --  135 134* 133*  K 5.5*   < > 4.1 4.6  --  4.5 4.8 4.8  CL 97*   < > 98 100  --  100 100 99  CO2 24   < > 27 25  --  24 23 21*  GLUCOSE 97   < > 91 141*  --  132* 212* 168*  BUN 52*   < > 41* 31*  --  28* 24* 20  CREATININE 7.05*   < > 5.97* 4.28*  --  3.84* 3.21* 2.75*  CALCIUM 8.6*   < > 8.2* 8.5*  --  8.8* 8.8* 9.0  MG 2.1  --  1.8  --  2.2 2.3  --  2.4  PHOS 6.2*  --  4.0 3.0  --  2.9 2.7 3.0   < > = values in this interval not displayed.    GFR: Estimated Creatinine Clearance: 23.4 mL/min (A) (by C-G formula based on SCr of 2.75 mg/dL (H)). Recent Labs  Lab 04/09/23 2130 04/14/2023 0002 03/27/2023 0208 04/05/2023 1638 04/13/2023 2103 04/12/23 0310 04/13/23 0407 04/14/23 0405 04/15/23 0406  WBC 8.7  --    < >  --    < > 10.4 8.8 15.5* 11.5*  LATICACIDVEN 2.2* 1.2  --  1.3  --   --   --   --   --    < > = values in this interval not displayed.   Liver Function Tests: Recent Labs  Lab 04/09/23 2130 04/14/2023 2103 04/13/23 0407 04/13/23 0908 04/13/23 1726 04/14/23 0405 04/14/23 1739 04/15/23 0406  AST 36 75*  --  28  --   --   --   --   ALT 33 54*  --  35  --   --   --   --   ALKPHOS 72 74  --  61  --   --   --   --   BILITOT 1.1 1.1  --  0.8  --   --   --   --   PROT 5.6* 5.8*  --  5.4*  --   --   --   --   ALBUMIN 3.3* 3.2*   < > 3.0* 3.1* 3.0* 3.0* 3.1*   < > = values in this interval not displayed.   This patient is critically ill with multiple organ system failure which requires frequent high complexity decision making, assessment, support, evaluation, and titration of therapies. This was completed through the application of advanced monitoring technologies and extensive interpretation of multiple databases. During this encounter critical care time was devoted to patient care services described in this note for 33 minutes.  Steffanie Dunn, DO 04/15/23 1:33 PM [facility]  For contact information, see Amion. If no response to pager, please call PCCM consult pager. After hours, 7PM- 7AM, please call Elink.

## 2023-04-15 NOTE — Plan of Care (Signed)
Problem: Education: Goal: Knowledge of General Education information will improve Description: Including pain rating scale, medication(s)/side effects and non-pharmacologic comfort measures Outcome: Progressing   Problem: Health Behavior/Discharge Planning: Goal: Ability to manage health-related needs will improve Outcome: Progressing   Problem: Clinical Measurements: Goal: Ability to maintain clinical measurements within normal limits will improve Outcome: Progressing Goal: Will remain free from infection Outcome: Progressing Goal: Diagnostic test results will improve Outcome: Progressing Goal: Respiratory complications will improve Outcome: Progressing Goal: Cardiovascular complication will be avoided Outcome: Progressing   Problem: Activity: Goal: Risk for activity intolerance will decrease Outcome: Progressing   Problem: Nutrition: Goal: Adequate nutrition will be maintained Outcome: Progressing   Problem: Coping: Goal: Level of anxiety will decrease Outcome: Progressing   Problem: Elimination: Goal: Will not experience complications related to bowel motility Outcome: Progressing Goal: Will not experience complications related to urinary retention Outcome: Progressing   Problem: Pain Managment: Goal: General experience of comfort will improve Outcome: Progressing   Problem: Safety: Goal: Ability to remain free from injury will improve Outcome: Progressing   Problem: Skin Integrity: Goal: Risk for impaired skin integrity will decrease Outcome: Progressing   Problem: Education: Goal: Understanding of CV disease, CV risk reduction, and recovery process will improve Outcome: Progressing Goal: Individualized Educational Video(s) Outcome: Progressing   Problem: Activity: Goal: Ability to return to baseline activity level will improve Outcome: Progressing   Problem: Cardiovascular: Goal: Ability to achieve and maintain adequate cardiovascular perfusion  will improve Outcome: Progressing Goal: Vascular access site(s) Level 0-1 will be maintained Outcome: Progressing   Problem: Health Behavior/Discharge Planning: Goal: Ability to safely manage health-related needs after discharge will improve Outcome: Progressing   Problem: Education: Goal: Knowledge of disease or condition will improve Outcome: Progressing Goal: Knowledge of secondary prevention will improve (MUST DOCUMENT ALL) Outcome: Progressing Goal: Knowledge of patient specific risk factors will improve Loraine Leriche N/A or DELETE if not current risk factor) Outcome: Progressing   Problem: Ischemic Stroke/TIA Tissue Perfusion: Goal: Complications of ischemic stroke/TIA will be minimized Outcome: Progressing   Problem: Coping: Goal: Will verbalize positive feelings about self Outcome: Progressing Goal: Will identify appropriate support needs Outcome: Progressing   Problem: Health Behavior/Discharge Planning: Goal: Ability to manage health-related needs will improve Outcome: Progressing Goal: Goals will be collaboratively established with patient/family Outcome: Progressing   Problem: Self-Care: Goal: Ability to participate in self-care as condition permits will improve Outcome: Progressing Goal: Verbalization of feelings and concerns over difficulty with self-care will improve Outcome: Progressing Goal: Ability to communicate needs accurately will improve Outcome: Progressing   Problem: Nutrition: Goal: Risk of aspiration will decrease Outcome: Progressing Goal: Dietary intake will improve Outcome: Progressing   Problem: Education: Goal: Understanding of cardiac disease, CV risk reduction, and recovery process will improve Outcome: Progressing Goal: Individualized Educational Video(s) Outcome: Progressing   Problem: Activity: Goal: Ability to tolerate increased activity will improve Outcome: Progressing   Problem: Cardiac: Goal: Ability to achieve and maintain  adequate cardiovascular perfusion will improve Outcome: Progressing   Problem: Health Behavior/Discharge Planning: Goal: Ability to safely manage health-related needs after discharge will improve Outcome: Progressing   Problem: Education: Goal: Ability to describe self-care measures that may prevent or decrease complications (Diabetes Survival Skills Education) will improve Outcome: Progressing Goal: Individualized Educational Video(s) Outcome: Progressing   Problem: Coping: Goal: Ability to adjust to condition or change in health will improve Outcome: Progressing   Problem: Fluid Volume: Goal: Ability to maintain a balanced intake and output will improve Outcome: Progressing   Problem: Health  Behavior/Discharge Planning: Goal: Ability to identify and utilize available resources and services will improve Outcome: Progressing Goal: Ability to manage health-related needs will improve Outcome: Progressing   Problem: Metabolic: Goal: Ability to maintain appropriate glucose levels will improve Outcome: Progressing   Problem: Nutritional: Goal: Maintenance of adequate nutrition will improve Outcome: Progressing Goal: Progress toward achieving an optimal weight will improve Outcome: Progressing   Problem: Skin Integrity: Goal: Risk for impaired skin integrity will decrease Outcome: Progressing   Problem: Tissue Perfusion: Goal: Adequacy of tissue perfusion will improve Outcome: Progressing

## 2023-04-16 ENCOUNTER — Inpatient Hospital Stay (HOSPITAL_COMMUNITY): Payer: Medicare HMO

## 2023-04-16 ENCOUNTER — Inpatient Hospital Stay (HOSPITAL_COMMUNITY): Admission: AD | Disposition: E | Payer: Self-pay | Source: Other Acute Inpatient Hospital | Attending: Internal Medicine

## 2023-04-16 DIAGNOSIS — J810 Acute pulmonary edema: Secondary | ICD-10-CM | POA: Diagnosis not present

## 2023-04-16 DIAGNOSIS — E871 Hypo-osmolality and hyponatremia: Secondary | ICD-10-CM

## 2023-04-16 DIAGNOSIS — I214 Non-ST elevation (NSTEMI) myocardial infarction: Secondary | ICD-10-CM | POA: Diagnosis not present

## 2023-04-16 DIAGNOSIS — R570 Cardiogenic shock: Secondary | ICD-10-CM | POA: Diagnosis not present

## 2023-04-16 DIAGNOSIS — J9601 Acute respiratory failure with hypoxia: Secondary | ICD-10-CM | POA: Diagnosis not present

## 2023-04-16 HISTORY — PX: TEMPORARY PACEMAKER: CATH118268

## 2023-04-16 HISTORY — PX: CORONARY IMAGING/OCT: CATH118326

## 2023-04-16 HISTORY — PX: CORONARY ATHERECTOMY: CATH118238

## 2023-04-16 HISTORY — PX: AORTIC VALVULOPLASTY: CATH118226

## 2023-04-16 HISTORY — PX: CORONARY STENT INTERVENTION W/IMPELLA: CATH118235

## 2023-04-16 LAB — RENAL FUNCTION PANEL
Albumin: 3.2 g/dL — ABNORMAL LOW (ref 3.5–5.0)
Albumin: 2.9 g/dL — ABNORMAL LOW (ref 3.5–5.0)
Anion gap: 9 (ref 5–15)
Anion gap: 13 (ref 5–15)
BUN: 17 mg/dL (ref 8–23)
BUN: 26 mg/dL — ABNORMAL HIGH (ref 8–23)
CO2: 24 mmol/L (ref 22–32)
CO2: 21 mmol/L — ABNORMAL LOW (ref 22–32)
Calcium: 8.8 mg/dL — ABNORMAL LOW (ref 8.9–10.3)
Calcium: 9 mg/dL (ref 8.9–10.3)
Chloride: 100 mmol/L (ref 98–111)
Chloride: 99 mmol/L (ref 98–111)
Creatinine, Ser: 2.75 mg/dL — ABNORMAL HIGH (ref 0.61–1.24)
Creatinine, Ser: 3.63 mg/dL — ABNORMAL HIGH (ref 0.61–1.24)
GFR, Estimated: 22 mL/min — ABNORMAL LOW (ref >60)
GFR, Estimated: 16 mL/min — ABNORMAL LOW (ref >60)
Glucose, Bld: 173 mg/dL — ABNORMAL HIGH (ref 70–99)
Glucose, Bld: 167 mg/dL — ABNORMAL HIGH (ref 70–99)
Phosphorus: 2.8 mg/dL (ref 2.5–4.6)
Phosphorus: 4.3 mg/dL (ref 2.5–4.6)
Potassium: 4.7 mmol/L (ref 3.5–5.1)
Potassium: 5 mmol/L (ref 3.5–5.1)
Sodium: 133 mmol/L — ABNORMAL LOW (ref 135–145)
Sodium: 133 mmol/L — ABNORMAL LOW (ref 135–145)

## 2023-04-16 LAB — POCT I-STAT EG7
Acid-base deficit: 1 mmol/L (ref 0.0–2.0)
Acid-base deficit: 2 mmol/L (ref 0.0–2.0)
Bicarbonate: 24.6 mmol/L (ref 20.0–28.0)
Bicarbonate: 22.5 mmol/L (ref 20.0–28.0)
Calcium, Ion: 1.19 mmol/L (ref 1.15–1.40)
Calcium, Ion: 1.11 mmol/L — ABNORMAL LOW (ref 1.15–1.40)
HCT: 32 % — ABNORMAL LOW (ref 39.0–52.0)
HCT: 32 % — ABNORMAL LOW (ref 39.0–52.0)
Hemoglobin: 10.9 g/dL — ABNORMAL LOW (ref 13.0–17.0)
Hemoglobin: 10.9 g/dL — ABNORMAL LOW (ref 13.0–17.0)
O2 Saturation: 63 %
O2 Saturation: 67 %
Potassium: 4.7 mmol/L (ref 3.5–5.1)
Potassium: 4.3 mmol/L (ref 3.5–5.1)
Sodium: 135 mmol/L (ref 135–145)
Sodium: 137 mmol/L (ref 135–145)
TCO2: 26 mmol/L (ref 22–32)
TCO2: 24 mmol/L (ref 22–32)
pCO2, Ven: 42.5 mm[Hg] — ABNORMAL LOW (ref 44–60)
pCO2, Ven: 37.8 mm[Hg] — ABNORMAL LOW (ref 44–60)
pH, Ven: 7.37 (ref 7.25–7.43)
pH, Ven: 7.382 (ref 7.25–7.43)
pO2, Ven: 34 mm[Hg] (ref 32–45)
pO2, Ven: 35 mm[Hg] (ref 32–45)

## 2023-04-16 LAB — CBC
HCT: 30.8 % — ABNORMAL LOW (ref 39.0–52.0)
Hemoglobin: 9.8 g/dL — ABNORMAL LOW (ref 13.0–17.0)
MCH: 31.1 pg (ref 26.0–34.0)
MCHC: 31.8 g/dL (ref 30.0–36.0)
MCV: 97.8 fL (ref 80.0–100.0)
Platelets: 132 10*3/uL — ABNORMAL LOW (ref 150–400)
RBC: 3.15 MIL/uL — ABNORMAL LOW (ref 4.22–5.81)
RDW: 15.7 % — ABNORMAL HIGH (ref 11.5–15.5)
WBC: 14.9 10*3/uL — ABNORMAL HIGH (ref 4.0–10.5)
nRBC: 3.2 % — ABNORMAL HIGH (ref 0.0–0.2)

## 2023-04-16 LAB — HEPARIN LEVEL (UNFRACTIONATED): Heparin Unfractionated: 0.53 [IU]/mL (ref 0.30–0.70)

## 2023-04-16 LAB — POCT ACTIVATED CLOTTING TIME
Activated Clotting Time: 268 s
Activated Clotting Time: 275 s
Activated Clotting Time: 262 s
Activated Clotting Time: 379 s
Activated Clotting Time: 250 s
Activated Clotting Time: 256 s
Activated Clotting Time: 244 s
Activated Clotting Time: 262 s
Activated Clotting Time: 238 s
Activated Clotting Time: 256 s
Activated Clotting Time: 158 s

## 2023-04-16 LAB — GLUCOSE, CAPILLARY
Glucose-Capillary: 175 mg/dL — ABNORMAL HIGH (ref 70–99)
Glucose-Capillary: 174 mg/dL — ABNORMAL HIGH (ref 70–99)
Glucose-Capillary: 184 mg/dL — ABNORMAL HIGH (ref 70–99)

## 2023-04-16 LAB — MAGNESIUM: Magnesium: 2.5 mg/dL — ABNORMAL HIGH (ref 1.7–2.4)

## 2023-04-16 SURGERY — AORTIC VALVULOPLASTY
Anesthesia: LOCAL

## 2023-04-16 MED ORDER — SODIUM CHLORIDE 0.9% FLUSH
3.0000 mL | INTRAVENOUS | Status: DC | PRN
  Administered 2023-04-16: 3 mL via INTRAVENOUS

## 2023-04-16 MED ORDER — IOHEXOL 350 MG/ML SOLN
INTRAVENOUS | Status: DC | PRN
  Administered 2023-04-16: 300 mL

## 2023-04-16 MED ORDER — HEPARIN SODIUM (PORCINE) 1000 UNIT/ML IJ SOLN
INTRAMUSCULAR | Status: AC
  Filled 2023-04-16: qty 10

## 2023-04-16 MED ORDER — TICAGRELOR 90 MG PO TABS
ORAL_TABLET | ORAL | Status: DC | PRN
  Administered 2023-04-16: 90 mg via ORAL

## 2023-04-16 MED ORDER — ONDANSETRON HCL 4 MG/2ML IJ SOLN
INTRAMUSCULAR | Status: AC
  Filled 2023-04-16: qty 2

## 2023-04-16 MED ORDER — ONDANSETRON HCL 4 MG/2ML IJ SOLN
4.0000 mg | Freq: Four times a day (QID) | INTRAMUSCULAR | Status: DC | PRN
  Administered 2023-04-17 – 2023-04-18 (×2): 4 mg via INTRAVENOUS
  Filled 2023-04-16 (×2): qty 2

## 2023-04-16 MED ORDER — SODIUM CHLORIDE 0.9% FLUSH
3.0000 mL | Freq: Two times a day (BID) | INTRAVENOUS | Status: DC
  Administered 2023-04-16 – 2023-04-17 (×3): 3 mL via INTRAVENOUS

## 2023-04-16 MED ORDER — LABETALOL HCL 5 MG/ML IV SOLN: 10.0000 mg | INTRAVENOUS | Status: AC | PRN

## 2023-04-16 MED ORDER — MIDAZOLAM HCL 2 MG/2ML IJ SOLN
INTRAMUSCULAR | Status: AC
  Filled 2023-04-16: qty 2

## 2023-04-16 MED ORDER — FENTANYL CITRATE (PF) 100 MCG/2ML IJ SOLN
INTRAMUSCULAR | Status: DC | PRN
  Administered 2023-04-16 (×5): 25 ug via INTRAVENOUS

## 2023-04-16 MED ORDER — SODIUM CHLORIDE 0.9 % IV SOLN: INTRAVENOUS | Status: DC

## 2023-04-16 MED ORDER — FENTANYL CITRATE (PF) 100 MCG/2ML IJ SOLN
INTRAMUSCULAR | Status: AC
  Filled 2023-04-16: qty 2

## 2023-04-16 MED ORDER — ONDANSETRON HCL 4 MG/2ML IJ SOLN
INTRAMUSCULAR | Status: DC | PRN
  Administered 2023-04-16: 4 mg via INTRAVENOUS

## 2023-04-16 MED ORDER — LIDOCAINE HCL (PF) 1 % IJ SOLN
INTRAMUSCULAR | Status: AC
  Filled 2023-04-16: qty 30

## 2023-04-16 MED ORDER — MIDAZOLAM HCL 2 MG/2ML IJ SOLN
INTRAMUSCULAR | Status: DC | PRN
  Administered 2023-04-16 (×5): 1 mg via INTRAVENOUS

## 2023-04-16 MED ORDER — ASPIRIN 81 MG PO CHEW
81.0000 mg | CHEWABLE_TABLET | Freq: Every day | ORAL | Status: DC
  Administered 2023-04-17: 81 mg via ORAL
  Filled 2023-04-16: qty 1

## 2023-04-16 MED ORDER — SODIUM BICARBONATE 8.4 % IV SOLN
INTRAVENOUS | Status: DC
  Filled 2023-04-16 (×2): qty 25

## 2023-04-16 MED ORDER — HEPARIN SODIUM (PORCINE) 1000 UNIT/ML IJ SOLN
INTRAMUSCULAR | Status: DC | PRN
  Administered 2023-04-16 (×3): 2000 [IU] via INTRAVENOUS
  Administered 2023-04-16: 1000 [IU] via INTRAVENOUS
  Administered 2023-04-16 (×2): 2000 [IU] via INTRAVENOUS
  Administered 2023-04-16: 10000 [IU] via INTRAVENOUS

## 2023-04-16 MED ORDER — ASPIRIN 81 MG PO CHEW
81.0000 mg | CHEWABLE_TABLET | ORAL | Status: AC
  Administered 2023-04-16: 81 mg via ORAL
  Filled 2023-04-16: qty 1

## 2023-04-16 MED ORDER — ACETAMINOPHEN 325 MG PO TABS
650.0000 mg | ORAL_TABLET | ORAL | Status: DC | PRN
  Administered 2023-04-17: 650 mg via ORAL
  Filled 2023-04-16: qty 2

## 2023-04-16 MED ORDER — TICAGRELOR 90 MG PO TABS
ORAL_TABLET | ORAL | Status: AC
  Filled 2023-04-16: qty 1

## 2023-04-16 MED ORDER — HYDRALAZINE HCL 20 MG/ML IJ SOLN: 10.0000 mg | INTRAMUSCULAR | Status: AC | PRN

## 2023-04-16 MED ORDER — HEPARIN (PORCINE) 25000 UT/250ML-% IV SOLN
1600.0000 [IU]/h | INTRAVENOUS | Status: DC
  Filled 2023-04-16: qty 250

## 2023-04-16 MED ORDER — LIDOCAINE HCL (PF) 1 % IJ SOLN
INTRAMUSCULAR | Status: DC | PRN
  Administered 2023-04-16: 2 mL

## 2023-04-16 MED ORDER — SODIUM CHLORIDE 0.9 % IV SOLN: 250.0000 mL | INTRAVENOUS | Status: DC | PRN

## 2023-04-16 MED ORDER — HEPARIN (PORCINE) IN NACL 1000-0.9 UT/500ML-% IV SOLN
INTRAVENOUS | Status: DC | PRN
  Administered 2023-04-16 (×4): 500 mL

## 2023-04-16 SURGICAL SUPPLY — 66 items
BALL SAPPHIRE NC24 2.50X12 (BALLOONS) ×1
BALL SAPPHIRE NC24 3.0X15 (BALLOONS) ×1
BALL SAPPHIRE NC24 3.0X18 (BALLOONS) ×1
BALL SAPPHIRE NC24 4.0X8 (BALLOONS) ×1
BALLN MUSTANG 10X20X75 (BALLOONS)
BALLN MUSTANG 10X40X135 (BALLOONS) ×1
BALLN MUSTANG 9X20X135 (BALLOONS) ×1
BALLN SAPPHIRE 2.5X12 (BALLOONS) ×1
BALLN WOLVERINE 2.50X15 (BALLOONS) ×1
BALLN WOLVERINE 3.00X15 (BALLOONS) ×1
BALLOON MUSTANG 10X20X75 (BALLOONS) IMPLANT
BALLOON MUSTANG 10X40X135 (BALLOONS) IMPLANT
BALLOON MUSTANG 9X20X135 (BALLOONS) IMPLANT
BALLOON SAPPHIRE 2.5X12 (BALLOONS) IMPLANT
BALLOON SAPPHIRE NC24 2.50X12 (BALLOONS) IMPLANT
BALLOON SAPPHIRE NC24 3.0X15 (BALLOONS) IMPLANT
BALLOON SAPPHIRE NC24 3.0X18 (BALLOONS) IMPLANT
BALLOON SAPPHIRE NC24 4.0X8 (BALLOONS) IMPLANT
BALLOON TAKERU 1.5X12 (BALLOONS) IMPLANT
BALLOON WOLVERINE 2.50X15 (BALLOONS) IMPLANT
BALLOON WOLVERINE 3.00X15 (BALLOONS) IMPLANT
CABLE ADAPT PACING TEMP 12FT (ADAPTER) IMPLANT
CATH DRAGONFLY OPSTAR (CATHETERS) IMPLANT
CATH INFINITI 6F AL1 (CATHETERS) IMPLANT
CATH INFINITI 6F ANG MULTIPACK (CATHETERS) IMPLANT
CATH SWAN GANZ 7F STRAIGHT (CATHETERS) IMPLANT
CATH TELEPORT (CATHETERS) IMPLANT
CATH VISTA GUIDE 6FR XBLAD3.5 (CATHETERS) IMPLANT
CATH-GARD ARROW CATH SHIELD (MISCELLANEOUS) ×1
CLOSURE PERCLOSE PROSTYLE (VASCULAR PRODUCTS) IMPLANT
CROWN DIAMONDBACK CLASSIC 1.25 (BURR) IMPLANT
ELECT DEFIB PAD ADLT CADENCE (PAD) IMPLANT
GUIDEWIRE VAS SION BLUE 190 (WIRE) IMPLANT
KIT ENCORE 26 ADVANTAGE (KITS) IMPLANT
KIT ENCORE 40 (KITS) IMPLANT
KIT HEMO VALVE WATCHDOG (MISCELLANEOUS) IMPLANT
LUBRICANT VIPERSLIDE CORONARY (MISCELLANEOUS) IMPLANT
MAT PREVALON FULL STRYKER (MISCELLANEOUS) IMPLANT
PACK CARDIAC CATHETERIZATION (CUSTOM PROCEDURE TRAY) ×1 IMPLANT
PROTECTION STATION PRESSURIZED (MISCELLANEOUS) ×1
SET ATX-X65L (MISCELLANEOUS) IMPLANT
SET IMPELLA CP PUMP (CATHETERS) IMPLANT
SHEATH PINNACLE 6F 10CM (SHEATH) IMPLANT
SHEATH PINNACLE 7F 10CM (SHEATH) IMPLANT
SHEATH PINNACLE 8F 10CM (SHEATH) IMPLANT
SHEATH PROBE COVER 6X72 (BAG) IMPLANT
SHIELD CATHGARD ARROW (MISCELLANEOUS) IMPLANT
SLEEVE REPOSITIONING LENGTH 30 (MISCELLANEOUS) IMPLANT
STATION PROTECTION PRESSURIZED (MISCELLANEOUS) IMPLANT
STENT SYNERGY XD 2.25X12 (Permanent Stent) IMPLANT
STENT SYNERGY XD 3.0X24 (Permanent Stent) IMPLANT
SYNERGY XD 2.25X12 (Permanent Stent) ×1 IMPLANT
SYNERGY XD 3.0X24 (Permanent Stent) ×1 IMPLANT
SYR CONTROL 10ML ANGIOGRAPHIC (SYRINGE) IMPLANT
TUBING CONTRAST HIGH PRESS 20 (MISCELLANEOUS) IMPLANT
WIRE ASAHI PROWATER 180CM (WIRE) IMPLANT
WIRE CHOICE GRAPHX PT 300 (WIRE) IMPLANT
WIRE EMERALD 3MM-J .035X150CM (WIRE) IMPLANT
WIRE EMERALD 3MM-J .035X260CM (WIRE) IMPLANT
WIRE EMERALD ST .035X260CM (WIRE) IMPLANT
WIRE GUIDE ASAHI EXTENSION 165 (WIRE) IMPLANT
WIRE MICRO SET SILHO 5FR 7 (SHEATH) IMPLANT
WIRE MICROINTRODUCER 60CM (WIRE) IMPLANT
WIRE PACING TEMP ST TIP 5 (CATHETERS) IMPLANT
WIRE SAFARI SM CURVE 275 (WIRE) IMPLANT
WIRE VIPERWIRE COR FLEX .012 (WIRE) IMPLANT

## 2023-04-16 NOTE — Progress Notes (Signed)
NAME:  Jonathan Mcgrath, MRN:  [id number], DOB:  Mar 02, 1941, LOS: 6 ADMISSION DATE:  04/27/2023, CONSULTATION DATE:  04/27/23 REFERRING MD:  End - [facility] CHIEF COMPLAINT:  Weakness, NSTEMI  History of Present Illness:  82 year old man admitted to [facility] CVICU 2023-04-27 as a transfer from [facility] for NSTEMI and low flow state. PMHx significant for HTN, CHF (Echo 04/27/2023 EF 20-25%), aortitis (on Augmentin indefinitely, reportedly has not taken this for months), CVA, T2DM, ESRD (on HD MWF via LUE AVF), colon CA s/p hemicolectomy (2019).   Patient was initially admitted to [facility] 9/23 with weakness x 1 week. He was found to have NSTEMI and acute HFrEF c/b low flow/low gradient AS. He was started on Heparin infusion (of note, history of vitreous hemorrhage of eyes 3 months ago; risks/benefits of heparin were apparently discussed with ophthalmology and it was felt benefit outweighed risk). Plan was for volume removal via HD given his ESRD status.  Echo 2023-04-27 demonstrated EF 20-25% with global hypokinesis and septal dyskinesis related to LBBB, mildly dilated RV with mod reduced fx, mod to severe PAH, small pericardial effusion, AV severely thickened with TR, mod to severe TR and mod MR. Patient was taken for [facility] 04-27-2023 which demonstrated critical ostial LAD/LCx disease with 90-95%, mod proximal RCA and severe RPAV disease, severely elevated L and R filling pressures, severe PAH. HD was attempted 04-27-23, however due to hypotension patient was only able to tolerate 2L fluid removal prior to cessation.  Decision to transfer patient to [facility] for further Cardiology input, TCTS consultation and possible initiation of CRRT initiation. PCCM consulted Apr 27, 2023 for assistance with management.  Pertinent Medical History:   Past Medical History:  Diagnosis Date   Cancer (HCC) 2019   colon   Chronic kidney disease    Diabetes mellitus without complication (HCC)    diet controlled   Hemodialysis access site with arteriovenous graft (HCC)     Hypertension    Infection due to Clostridium septicum (HCC) 08/27/2018   Renal insufficiency    Pt on dialysis x 3 years and receives every Monday, Wednesday and Friday.    Significant Hospital Events: Including procedures, antibiotic start and stop dates in addition to other pertinent events   9/23 Admit to [facility] 2023/04/27 L/RHC with findings of critical ostial LAD/LCx disease with 90-95%, mod proximal RCA and severe RPAV disease, severely elevated L and R filling pressures, severe PAH.  Echo 04/27/23 > EF 20-25% with global HK and septal dyskinesis related to LBBB, mildly dilated RV with mod reduced fx, mod to severe PAH, small pericardial effusion, AV severely thickened with TR, mod to severe TR and mod MR.Transfer to [facility] CVICU. 9/25 DNR, aggressive medical care desired. iHD trial 9/26 palliative consult. A lot of back and forth by pt re how he wants to proceed and ultimately he has decided to proceed with offered high risk cardiac intervention + requisite optimization preceding. 9/27 started on CRRT for additional volume removal, advanced heart failure team consulted, with plan put in place for high risk PCI with Impella support after medical optimization.  Deemed not a surgical candidate per TCTS, right radial A-line placed by cardiology 9/28: Increased ectopy, added amiodarone, also vasopressin added to norepinephrine in hopes of decreasing norepinephrine dose requirements 9/29: Tolerating volume removal via CRRT, still volume overloaded but ectopy improving.  UF goals increased  Interim History / Subjective:   No distress  Objective:  Blood pressure (!) 104/54, pulse 67, temperature 97.7 F (36.5 C), temperature source Oral,  resp. rate 19, height 6\' 1"  (1.854 m), weight 85.8 kg, SpO2 97%. CVP:  [9 mmHg-18 mmHg] 9 mmHg      Intake/Output Summary (Last 24 hours) at 04-26-23 0817 Last data filed at 26-Apr-2023 0800 Gross per 24 hour  Intake 1510.52 ml  Output 4089 ml  Net -2578.48 ml    Filed Weights   04/14/23 0500 04/15/23 0600 26-Apr-2023 0615  Weight: 91.3 kg 88.6 kg 85.8 kg    Physical Examination: General pleasant 82 year old male laying in bed he is in no distress HENT NCAT no JVD  Pulm clear, dc bases. Currently on 2 lpm [place] Card diffuse HM C/w AS.  Ext warm and dry pulses are palp Abd soft Neuro intact  GU anuric   Resolved problem list  Constipation   Assessment & Plan:  Acute biventricular heart failure with cardiogenic shock   EF 20 to 25% with global hypokinesis Severe PH, WHO group 2 NSTEMI: 3-vessel severe CAD Severe AS, Mod-Sev TR, Mod MR Frequent ectopy, at risk for malignant arrhythmias Plan Continuing norepinephrine, shooting for ceiling of 10 mcg, continue vasopressin at 0.03  Continue volume removal  Continue telemetry monitoring  Hold goal-directed medical therapy given shock state  Continuing amiodarone infusion  high risk PCI with Impella device, this afternoon currently interventional cardiology still determining final plan Continue IV heparin Continue aspirin Continue Lipitor  ESRD on HD Plan Continuing CRRT currently -4.9 L Serial chemistries Continue volume removal  Acute resp failure with hypoxia due to pulmonary edema Plan Continuing volume removal via CRRT Continue to wean supplemental oxygen Continue pulse oximetry A.m. chest x-ray   Hx aortitis Hx clostridium septicum bacteremia  History of noncompliance with his chronic suppressive antibiotics Plan  continue chronic Augmentin  Hx aortic pseudoaneurysm s/p stents -- aorta, common iliac R and L iliac, external iliac  Plan  Continue aspirin and statin Anticipate the need for vascular surgery follow-up after needing MCS  Chronic anemia Chronic thrombocytopenia > improving, stable Plan Transfuse for hemoglobin less than 8 given concern for subacute cardiac ischemia Monitor for signs of bleeding.  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 Plan Zofran as needed Would not be too aggressive about enteral nutrition if he is having symptoms which may be an anginal equivalent  DNR status PTA? Goals of care -He indicated DNR status 9/25, cards d/w him that he would need to be full code for high-risk intervention. Appropriate if he is going to pursue high risk interventions to remain full code, with understanding that his family would make decisions for him post-procedurally if he would be disabled in a long-term fashion.    2 family members updated at bedside with Mr. Rosal during rounds.  Best practice (evaluated daily):  Per Primary Team    My time 32 min   Simonne Martinet ACNP-BC [facility] Pulmonary/Critical Care Pager # [phone] OR # [phone] if no answer

## 2023-04-16 NOTE — Progress Notes (Signed)
Advanced Heart Failure Team Progress Note   Primary Physician: Estell Harpin, MD PCP-Cardiologist:  Yvonne Kendall, MD  Reason for Consultation: Optimization prior to high-risk PCI with Impella support  HPI:     Jonathan Mcgrath is a 82 y.o. 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 Smedley initially presented to [facility] via EMS after experiencing waekness and dizziness at home.Taken to the cath lab and found to have critical ostial LAD/LCx disease with 90-95% stenosis and moderate proximal RCA and severe RPAV disease.Transferred to [facility] for cardiac surgery evaluation.  Since being at [facility] he was unable to be diuresed with iHD so he was started on CRRT. The family and team have had multiple GOC discussions to clearly define patients wishes. At this time he wished to proceed to go forward with high risk interventions but would like to be DNR/DNI otherwise. TCTS evaluated deemed patient not a candidate for surgery. Plan is now for high-risk PCI with impella support. Plan to revisit TAVR after PCI complete.   - Negative 2.4L yesterday; pulling at 200cc/hr this morning.  - Comfortable; no SOB, CP; JVP still elevated to midneck.   Objective:    Vital Signs:   Temp:  [97.7 F (36.5 C)-98.6 F (37 C)] 97.7 F (36.5 C) (09/30 0700) Pulse Rate:  [63-71] 67 (09/30 0800) Resp:  [9-26] 19 (09/30 0800) BP: (92-123)/(44-85) 104/54 (09/30 0800) SpO2:  [86 %-100 %] 97 % (09/30 0800) Arterial Line BP: (102-153)/(35-84) 116/41 (09/30 0800) Weight:  [85.8 kg] 85.8 kg (09/30 0615) Last BM Date : 04/15/23  Weight change: Filed Weights   04/14/23 0500 04/15/23 0600 03/30/2023 0615  Weight: 91.3 kg 88.6 kg 85.8 kg   Intake/Output:   Intake/Output Summary (Last 24 hours) at 03/24/2023 0841 Last data filed at 03/28/2023 0800 Gross per 24 hour  Intake 1510.52 ml  Output 4089 ml  Net -2578.48 ml    Physical Exam   General:  elderly appearing.  No respiratory difficulty HEENT: normal Neck: supple. JVD elevated to midneck. Carotids 2+ bilat; no bruits. No lymphadenopathy or thyromegaly appreciated. LIJ HD cath Cor: PMI nondisplaced. Regular rate & rhythm. No rubs, gallops or murmurs. Lungs: clear Abdomen: soft, nontender, nondistended. No hepatosplenomegaly. No bruits or masses. Good bowel sounds. Extremities: no cyanosis, clubbing, rash, edema. LUE fistula +bruit/thrill Neuro: alert & oriented x 3, cranial nerves grossly intact. moves all 4 extremities w/o difficulty. Affect pleasant.   Telemetry   NSR 80s (Personally reviewed)    EKG    NSR 1st degree AVB, LBBB 80s   Labs   Basic Metabolic Panel: Recent Labs  Lab 04/13/23 0407 04/13/23 1726 04/13/23 2011 04/14/23 0405 04/14/23 1739 04/15/23 0406 04/15/23 1551 04/09/2023 0405  NA 135   < >  --  135 134* 133* 134* 133*  K 4.1   < >  --  4.5 4.8 4.8 4.7 4.7  CL 98   < >  --  100 100 99 100 100  CO2 27   < >  --  24 23 21* 25 24  GLUCOSE 91   < >  --  132* 212* 168* 184* 173*  BUN 41*   < >  --  28* 24* 20 18 17   CREATININE 5.97*   < >  --  3.84* 3.21* 2.75* 2.77* 2.75*  CALCIUM 8.2*   < >  --  8.8* 8.8* 9.0 8.9 8.8*  MG  1.8  --  2.2 2.3  --  2.4  --  2.5*  PHOS 4.0   < >  --  2.9 2.7 3.0 2.9 2.8   < > = values in this interval not displayed.    Liver Function Tests: Recent Labs  Lab 04/09/23 2130 04/13/2023 2103 04/13/23 0407 04/13/23 0908 04/13/23 1726 04/14/23 0405 04/14/23 1739 04/15/23 0406 04/15/23 1551 May 13, 2023 0405  AST 36 75*  --  28  --   --   --   --   --   --   ALT 33 54*  --  35  --   --   --   --   --   --   ALKPHOS 72 74  --  61  --   --   --   --   --   --   BILITOT 1.1 1.1  --  0.8  --   --   --   --   --   --   PROT 5.6* 5.8*  --  5.4*  --   --   --   --   --   --   ALBUMIN 3.3* 3.2*   < > 3.0*   < > 3.0* 3.0* 3.1* 3.2* 3.2*   < > = values in this interval not displayed.   CBC: Recent Labs  Lab  04/12/23 0310 04/12/23 0753 04/13/23 0407 04/14/23 0405 04/15/23 0406 05-13-2023 0405  WBC 10.4  --  8.8 15.5* 11.5* 14.9*  HGB 10.3* 10.5* 9.6* 10.0* 10.1* 9.8*  HCT 31.2* 31.0* 29.4* 31.0* 31.0* 30.8*  MCV 96.3  --  95.8 97.5 99.4 97.8  PLT 80*  --  86* 135* 112* 132*    BNP: BNP (last 3 results) Recent Labs    04/09/23 2130  BNP 3,735.5*   CBG: Recent Labs  Lab 04/15/23 0645 04/15/23 1200 04/15/23 1550 04/15/23 2104 13-May-2023 0649  GLUCAP 167* 133* 190* 195* 175*     Imaging   Cardiac studies reviewed:  RHC 9/24: Critical ostial LAD/LCx disease with 90-95% stenosis.  Moderate proximal RCA and severe RPAV disease. Severely elevated left and right heart filling pressures. Severe pulmonary hypertension. Normal Fick cardiac output/index, likely elevated by AV fistula. RA 17, PA 80/30 (47), PCWP 30, Fick CO/CI 7.2/3.3. TD CO/CI 5.5/2.6 Echo 9/24: EF 20-25%, LV GHK, mod reduced RV, mod-sev pulmonary hypertension (RVSP 55-60), LA mildly dilated, small pericardial effusion, moderate MR, mod-severe TR, severe low-flow/low-gradient AV stenosis.  Echo 12/23: EF 55-60%, GIIDD   Medications:     Current Medications:  amoxicillin-clavulanate  1 tablet Oral Q12H   aspirin EC  81 mg Oral Daily   atorvastatin  80 mg Oral Daily   Chlorhexidine Gluconate Cloth  6 each Topical Daily   Chlorhexidine Gluconate Cloth  6 each Topical Q0600   insulin aspart  0-5 Units Subcutaneous QHS   insulin aspart  0-6 Units Subcutaneous TID WC   multivitamin  1 tablet Per Tube BID   pantoprazole  40 mg Oral Daily   polyethylene glycol  17 g Oral Daily   ticagrelor  90 mg Oral BID    Infusions:   prismasol BGK 4/2.5 600 mL/hr at 05/13/2023 0521    prismasol BGK 4/2.5 400 mL/hr at 04/15/23 1420   sodium chloride     sodium chloride     amiodarone 30 mg/hr (May 13, 2023 0800)   anticoagulant sodium citrate     heparin 1,800 Units/hr (05/13/23 0800)   norepinephrine (LEVOPHED)  Adult infusion 10  mcg/min (04/07/2023 0800)   prismasol BGK 4/2.5 1,000 mL/hr at 04/11/2023 6387   vasopressin 0.03 Units/min (04/12/2023 0800)      Patient Profile   Jonathan Mcgrath is a 82 y.o. 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 AHF team to see for optimization prior to high-risk PCI with Impella support.   Assessment/Plan  Cardiogenic shock w/ biventricular failure - Echo 12/23: EF 55-60%, GIIDD - Echo 9/24: EF 20-25%, LV GHK, mod-sev pulmonary hypertension (RVSP 55-60), LA mildly dilated, small pericardial effusion, moderate MR, mod-severe TR, severe low-flow/low-gradient AV stenosis.  -Right heart cath on 04/06/2023 with RA 17, PA 80/30 (47), PCWP 30 mmHg.  Cardiac index 2.6 L/min/m by thermal dilution - Negative 2.5L urine output over the past 24h; JVP remains significantly elevated. Planning for PCI this afternoon. Will continue to pull 200/hr this morning. Now on levophed / vasopressin 0.03-0.04.   NSTEMI, CAD - [facility] 9/24: Critical ostial LAD/LCx disease with 90-95% stenosis.  Moderate proximal RCA and severe RPAV disease.  - HsTrop 2.6K>3.4K>5K>7K>9K  - Plan for high risk PCI with Impella support - Continue heparin gtt - Continue ASA/statin - Denies CP  Aortic valve stenosis - Echo 9/24: severe low-flow/low-gradient AV stenosis. - May be candidate for high risk TAVR, plan to revisit post PCI - Per TCTS not surgical candidate - avoid hypotension  ESRD on HD - Per nephrology, now on CRRT as he did not tolerate iHD - Would try to pull -200cc/hr - Continue norepi / levophed  HTN - BP currently soft on norepi - avoid hypotension  Hx aortitis - on chronic abx - s/p bilateral iliac stenting 19'  Hx stroke - Continue ASA/stain  Colon CA - s/p hemicolectomy  GOC - DNR - Palliative care team following   Length of Stay: 6  Rosan Calbert, DO  04/09/2023, 8:41 AM  Advanced Heart Failure  Team Pager [phone] (M-F; 7a - 5p)  Please contact [facility] for night-coverage after hours (4p -7a ) and weekends on [email or web address]   CRITICAL CARE Performed by: Dorthula Nettles   Total critical care time: 35 minutes  Critical care time was exclusive of separately billable procedures and treating other patients.  Critical care was necessary to treat or prevent imminent or life-threatening deterioration.  Critical care was time spent personally by me on the following activities: development of treatment plan with patient and/or surrogate as well as nursing, discussions with consultants, evaluation of patient's response to treatment, examination of patient, obtaining history from patient or surrogate, ordering and performing treatments and interventions, ordering and review of laboratory studies, ordering and review of radiographic studies, pulse oximetry and re-evaluation of patient's condition.

## 2023-04-16 NOTE — Progress Notes (Signed)
ANTICOAGULATION CONSULT NOTE  Pharmacy Consult for heparin gtt Indication: chest pain/ACS  No Known Allergies  Patient Measurements: Height: 6\' 1"  (185.4 cm) Weight: 85.8 kg (189 lb 2.5 oz) IBW/kg (Calculated) : 79.9 Heparin Dosing Weight: 90.7 kg  Vital Signs: Temp: 97.7 F (36.5 C) 2023/04/22 0700) Temp Source: Oral April 22, 2023 0700) BP: 104/54 2023/04/22 0800) Pulse Rate: 67 04-22-23 0800)  Labs: Recent Labs    04/14/23 0405 04/14/23 1739 04/14/23 1740 04/15/23 0406 04/15/23 1551 Apr 22, 2023 0405  HGB 10.0*  --   --  10.1*  --  9.8*  HCT 31.0*  --   --  31.0*  --  30.8*  PLT 135*  --   --  112*  --  132*  HEPARINUNFRC 0.29*  --  0.49 0.43  --  0.53  CREATININE 3.84*   < >  --  2.75* 2.77* 2.75*   < > = values in this interval not displayed.    Estimated Creatinine Clearance: 23.4 mL/min (A) (by C-G formula based on SCr of 2.75 mg/dL (H)).   Medical History: Past Medical History:  Diagnosis Date   Cancer (HCC) 2019   colon   Chronic kidney disease    Diabetes mellitus without complication (HCC)    diet controlled   Hemodialysis access site with arteriovenous graft (HCC)    Hypertension    Infection due to Clostridium septicum (HCC) 08/27/2018   Renal insufficiency    Pt on dialysis x 3 years and receives every Monday, Wednesday and Friday.    Assessment: 82 yo M with NSTEMI taken to cath on 9/24 and found to have extensive disease. He was transferred to [facility] for definitive management. Pharmacy consulted to start heparin infusion.   Heparin level 0.53 is therapeutic with heparin running at 1800 units/hr. Hgb (9.8) and PLTs (132) are stable. Per RN, no report of pauses, issues with the line, or signs of bleeding. Planning for [facility] today.  Goal of Therapy:  Heparin level 0.3-0.7 units/ml Monitor platelets by anticoagulation protocol: Yes  Plan:  Continue heparin drip at 1800 units/hr for now Monitor daily heparin level and CBC Monitor signs and symptoms of bleeding F/u  plan after [facility]  Romie Minus, PharmD PGY1 Pharmacy Resident  Please check AMION for all [facility] Pharmacy phone numbers After 10:00 PM, call Main Pharmacy [phone] April 22, 2023 8:46 AM

## 2023-04-16 NOTE — Progress Notes (Addendum)
[facility] NEPHROLOGY PROGRESS NOTE  Subjective: seen in room. In good spirits. CRRT d/w nurse.   Objective Vital signs in last 24 hours: Vitals:   03/25/2023 0900 03/23/2023 0915 04/04/2023 0930 04/07/2023 0945  BP: (!) 112/52  (!) 107/57   Pulse: 65 63 63 63  Resp: 19 19 18  (!) 21  Temp:      TempSrc:      SpO2: 93% 98% 95% 97%  Weight:      Height:       Physical Exam: General:NAD, comfortable Heart:RRR, s1s2 nl Lungs:clear b/l, no crackle Abdomen:soft, Non-tender, non-distended Extremities:No edema Dialysis Access: AV fistula with good thrill and bruit.  Left IJ temporary HD catheter placed by PCCM on 9/26.   OP HD: [place] [facility]   pending  Summary Pt is a 82 y.o. yo male  with past medical history significant for hypertension, DM,  CHF with EF of 20 to 25%, stroke, ESRD on HD MWF, colon cancer status post hemicolectomy who was transferred from [facility] for an NSTEMI, seen as a consultation for the management of ESRD.    Assessment/ Plan # Acute on chronic CHF with EF of 20% CAD/NSTEMI/severe AS: Cardiology team is planning for PCI and Impella.  Manage volume with CRRT.   # ESRD: MWF at [facility]: Patient received conventional HD on 9/25 with 500 cc complicated by IDH.  After GOC discussion, CRRT started on 9/26. UF as tolerated, all 4K bath w/ circuit heparin. Cont CRRT.    # Shock/volume: remains on Levophed, vasopressin gtts. Euvolemic on exam, don't have a dry wt yet from his OP unit. Will get f/u CXR today.    # Anemia of ESRD: Hemoglobin at goal.  No need for ESA.  Try to hold ESA in the setting of CAD.   # CKD-metabolic Bone Disease: DC Auryxia as patient is on CRRT.  Vinson Moselle  MD  [facility] 04/11/2023, 11:06 AM  Recent Labs  Lab 04/15/23 0406 04/15/23 1551 04/08/2023 0405  HGB 10.1*  --  9.8*  ALBUMIN 3.1* 3.2* 3.2*  CALCIUM 9.0 8.9 8.8*  PHOS 3.0 2.9 2.8  CREATININE 2.75* 2.77* 2.75*  K 4.8 4.7 4.7    Inpatient medications:   amoxicillin-clavulanate  1 tablet Oral Q12H   aspirin EC  81 mg Oral Daily   atorvastatin  80 mg Oral Daily   Chlorhexidine Gluconate Cloth  6 each Topical Daily   Chlorhexidine Gluconate Cloth  6 each Topical Q0600   insulin aspart  0-5 Units Subcutaneous QHS   insulin aspart  0-6 Units Subcutaneous TID WC   multivitamin  1 tablet Per Tube BID   pantoprazole  40 mg Oral Daily   polyethylene glycol  17 g Oral Daily   ticagrelor  90 mg Oral BID     prismasol BGK 4/2.5 600 mL/hr at 03/26/2023 0521    prismasol BGK 4/2.5 400 mL/hr at 04/15/23 1420   sodium chloride     sodium chloride     amiodarone 30 mg/hr (03/23/2023 1054)   anticoagulant sodium citrate     heparin 1,800 Units/hr (03/26/2023 1000)   norepinephrine (LEVOPHED) Adult infusion 12 mcg/min (04/06/2023 1000)   prismasol BGK 4/2.5 1,000 mL/hr at 03/31/2023 2130   vasopressin 0.03 Units/min (03/23/2023 1000)   Place/Maintain arterial line **AND** sodium chloride, acetaminophen, alteplase, anticoagulant sodium citrate, heparin, heparin, heparin, lidocaine (PF), lidocaine-prilocaine, nitroGLYCERIN, ondansetron (ZOFRAN) IV, mouth rinse, pentafluoroprop-tetrafluoroeth, senna

## 2023-04-16 NOTE — Progress Notes (Signed)
Orthopedic Tech Progress Note Patient Details:  Jonathan Mcgrath 1940/12/07 [id number]  Ortho Devices Type of Ortho Device: Knee Immobilizer Ortho Device/Splint Interventions: Ordered  Delivered to room at rn request.    Trinna Post 04/04/2023, 8:54 PM

## 2023-04-16 NOTE — Progress Notes (Signed)
Pt receives out-pt HD at [facility] on MWF 6:30 am chair time. Will assist as needed.   Olivia Canter Renal Navigator [phone]

## 2023-04-16 NOTE — Progress Notes (Signed)
ANTICOAGULATION CONSULT NOTE - Initial Consult  Pharmacy Consult for heparin Indication:  Impella  No Known Allergies  Patient Measurements: Height: 6\' 1"  (185.4 cm) Weight: 85.8 kg (189 lb 2.5 oz) IBW/kg (Calculated) : 79.9  Vital Signs: Temp: 98.4 F (36.9 C) (09/30 2200) Temp Source: Oral (09/30 1217) BP: 102/60 (09/30 2200) Pulse Rate: 66 (09/30 2200)  Labs: Recent Labs    04/14/23 0405 04/14/23 1739 04/14/23 1740 04/15/23 0406 04/15/23 1551 04/06/2023 0405 04/06/2023 1528 04/12/2023 1531  HGB 10.0*  --   --  10.1*  --  9.8* 10.9* 10.9*  HCT 31.0*  --   --  31.0*  --  30.8* 32.0* 32.0*  PLT 135*  --   --  112*  --  132*  --   --   HEPARINUNFRC 0.29*  --  0.49 0.43  --  0.53  --   --   CREATININE 3.84*   < >  --  2.75* 2.77* 2.75*  --   --    < > = values in this interval not displayed.    Estimated Creatinine Clearance: 23.4 mL/min (A) (by C-G formula based on SCr of 2.75 mg/dL (H)).   Medical History: Past Medical History:  Diagnosis Date   Cancer (HCC) 2019   colon   Chronic kidney disease    Diabetes mellitus without complication (HCC)    diet controlled   Hemodialysis access site with arteriovenous graft (HCC)    Hypertension    Infection due to Clostridium septicum (HCC) 08/27/2018   Renal insufficiency    Pt on dialysis x 3 years and receives every Monday, Wednesday and Friday.     Medications:  Medications Prior to Admission  Medication Sig Dispense Refill Last Dose   acetaminophen (TYLENOL) 325 MG tablet Take 2 tablets (650 mg total) by mouth every 4 (four) hours as needed for mild pain (or temp > 37.5 C (99.5 F)). 20 tablet 0    amoxicillin-clavulanate (AUGMENTIN) 500-125 MG tablet Take 1 tablet by mouth daily. (Patient not taking: Reported on 03/30/2023) 30 tablet 12    aspirin EC 81 MG tablet Take 1 tablet (81 mg total) by mouth daily. Swallow whole. 30 tablet 12    atorvastatin (LIPITOR) 40 MG tablet Take 1 tablet (40 mg total) by mouth daily.  (Patient not taking: Reported on 04/13/2023) 30 tablet 1    B Complex-C-Folic Acid (RENA-VITE RX) 1 MG TABS Take 1 tablet by mouth daily. (Patient not taking: Reported on 03/22/2023)      cinacalcet (SENSIPAR) 60 MG tablet Take 60 mg by mouth daily.      Ergocalciferol (VITAMIN D2) 10 MCG (400 UNIT) TABS Take 1 tab daily (Patient not taking: Reported on 03/27/2023) 30 tablet 0    famotidine (PEPCID) 20 MG tablet Take 20 mg by mouth 2 (two) times daily. 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 (Patient not taking: Reported on 04/11/2023)      ferric citrate (AURYXIA) 1 GM 210 MG(Fe) tablet Take 210 mg by mouth 3 (three) times daily with meals. (Patient not taking: Reported on 03/26/2023)      heparin 40981 UT/250ML infusion Inject 1,100 Units/hr into the vein continuous.      multivitamin (RENA-VIT) TABS tablet Take 1 tablet by mouth daily. (Patient not taking: Reported on 03/27/2023) 30 tablet 11    ondansetron (ZOFRAN) 4 MG tablet Take 4 mg by mouth 3 (three) times daily as needed for nausea/vomiting.      polyethylene glycol powder (MIRALAX) 17 GM/SCOOP  powder Mix full container in 64 ounces of Gatorade or other clear liquid. No Red Liquids 238 g 0    senna-docusate (SENOKOT-S) 8.6-50 MG tablet Take 1 tablet by mouth 2 (two) times daily. 30 tablet 0    Scheduled:   amoxicillin-clavulanate  1 tablet Oral Q12H   [START ON 04/17/2023] aspirin  81 mg Oral Daily   atorvastatin  80 mg Oral Daily   Chlorhexidine Gluconate Cloth  6 each Topical Daily   Chlorhexidine Gluconate Cloth  6 each Topical Q0600   insulin aspart  0-5 Units Subcutaneous QHS   insulin aspart  0-6 Units Subcutaneous TID WC   multivitamin  1 tablet Per Tube BID   pantoprazole  40 mg Oral Daily   polyethylene glycol  17 g Oral Daily   sodium chloride flush  3 mL Intravenous Q12H   ticagrelor  90 mg Oral BID   Infusions:    prismasol BGK 4/2.5 600 mL/hr at 03/24/2023 0521    prismasol BGK 4/2.5 400 mL/hr at 04/15/23 1420   sodium chloride      sodium chloride 10 mL/hr at 03/28/2023 2200   amiodarone 30 mg/hr (03/21/2023 2326)   anticoagulant sodium citrate     heparin Stopped (03/19/2023 1414)   norepinephrine (LEVOPHED) Adult infusion 7 mcg/min (03/22/2023 2200)   prismasol BGK 4/2.5 1,000 mL/hr at 04/09/2023 0622   sodium bicarbonate 25 mEq (Impella PURGE) in dextrose 5 % 1000 mL bag     vasopressin 0.03 Units/min (03/25/2023 2200)    Assessment: 82yo male to resume heparin with low goal s/p Impella placement; ACT now 158.  Heparin level was previously 0.43-0.53 at 1800 units/hr.  Goal of Therapy:  Heparin level 0.2-0.5 units/ml Monitor platelets by anticoagulation protocol: Yes   Plan:  Resume heparin at lower rate of 1500 units/hr. Monitor heparin levels and CBC.  Vernard Gambles, PharmD, BCPS  03/19/2023,11:27 PM

## 2023-04-17 ENCOUNTER — Other Ambulatory Visit (HOSPITAL_COMMUNITY): Payer: Self-pay

## 2023-04-17 ENCOUNTER — Encounter (HOSPITAL_COMMUNITY): Payer: Self-pay | Admitting: Internal Medicine

## 2023-04-17 DIAGNOSIS — Z515 Encounter for palliative care: Secondary | ICD-10-CM | POA: Diagnosis not present

## 2023-04-17 DIAGNOSIS — N186 End stage renal disease: Secondary | ICD-10-CM | POA: Diagnosis not present

## 2023-04-17 DIAGNOSIS — I350 Nonrheumatic aortic (valve) stenosis: Secondary | ICD-10-CM | POA: Diagnosis not present

## 2023-04-17 DIAGNOSIS — I214 Non-ST elevation (NSTEMI) myocardial infarction: Secondary | ICD-10-CM | POA: Diagnosis not present

## 2023-04-17 DIAGNOSIS — Z7189 Other specified counseling: Secondary | ICD-10-CM | POA: Diagnosis not present

## 2023-04-17 LAB — RENAL FUNCTION PANEL
Albumin: 3.1 g/dL — ABNORMAL LOW (ref 3.5–5.0)
Albumin: 3 g/dL — ABNORMAL LOW (ref 3.5–5.0)
Anion gap: 11 (ref 5–15)
Anion gap: 12 (ref 5–15)
BUN: 24 mg/dL — ABNORMAL HIGH (ref 8–23)
BUN: 19 mg/dL (ref 8–23)
CO2: 22 mmol/L (ref 22–32)
CO2: 23 mmol/L (ref 22–32)
Calcium: 8.8 mg/dL — ABNORMAL LOW (ref 8.9–10.3)
Calcium: 8.8 mg/dL — ABNORMAL LOW (ref 8.9–10.3)
Chloride: 100 mmol/L (ref 98–111)
Chloride: 99 mmol/L (ref 98–111)
Creatinine, Ser: 3.01 mg/dL — ABNORMAL HIGH (ref 0.61–1.24)
Creatinine, Ser: 2.73 mg/dL — ABNORMAL HIGH (ref 0.61–1.24)
GFR, Estimated: 20 mL/min — ABNORMAL LOW (ref >60)
GFR, Estimated: 23 mL/min — ABNORMAL LOW (ref >60)
Glucose, Bld: 163 mg/dL — ABNORMAL HIGH (ref 70–99)
Glucose, Bld: 177 mg/dL — ABNORMAL HIGH (ref 70–99)
Phosphorus: 3.8 mg/dL (ref 2.5–4.6)
Phosphorus: 2.7 mg/dL (ref 2.5–4.6)
Potassium: 4.8 mmol/L (ref 3.5–5.1)
Potassium: 4.4 mmol/L (ref 3.5–5.1)
Sodium: 133 mmol/L — ABNORMAL LOW (ref 135–145)
Sodium: 134 mmol/L — ABNORMAL LOW (ref 135–145)

## 2023-04-17 LAB — CBC
HCT: 27.2 % — ABNORMAL LOW (ref 39.0–52.0)
HCT: 26.9 % — ABNORMAL LOW (ref 39.0–52.0)
Hemoglobin: 9 g/dL — ABNORMAL LOW (ref 13.0–17.0)
Hemoglobin: 9 g/dL — ABNORMAL LOW (ref 13.0–17.0)
MCH: 31.9 pg (ref 26.0–34.0)
MCH: 33.2 pg (ref 26.0–34.0)
MCHC: 33.1 g/dL (ref 30.0–36.0)
MCHC: 33.5 g/dL (ref 30.0–36.0)
MCV: 96.5 fL (ref 80.0–100.0)
MCV: 99.3 fL (ref 80.0–100.0)
Platelets: 113 10*3/uL — ABNORMAL LOW (ref 150–400)
Platelets: 128 10*3/uL — ABNORMAL LOW (ref 150–400)
RBC: 2.82 MIL/uL — ABNORMAL LOW (ref 4.22–5.81)
RBC: 2.71 MIL/uL — ABNORMAL LOW (ref 4.22–5.81)
RDW: 15.9 % — ABNORMAL HIGH (ref 11.5–15.5)
RDW: 16.6 % — ABNORMAL HIGH (ref 11.5–15.5)
WBC: 14.5 10*3/uL — ABNORMAL HIGH (ref 4.0–10.5)
WBC: 25.8 10*3/uL — ABNORMAL HIGH (ref 4.0–10.5)
nRBC: 4.2 % — ABNORMAL HIGH (ref 0.0–0.2)
nRBC: 6.4 % — ABNORMAL HIGH (ref 0.0–0.2)

## 2023-04-17 LAB — HEPARIN LEVEL (UNFRACTIONATED): Heparin Unfractionated: 0.19 [IU]/mL — ABNORMAL LOW (ref 0.30–0.70)

## 2023-04-17 LAB — LACTIC ACID, PLASMA: Lactic Acid, Venous: 1.1 mmol/L (ref 0.5–1.9)

## 2023-04-17 LAB — GLUCOSE, CAPILLARY
Glucose-Capillary: 174 mg/dL — ABNORMAL HIGH (ref 70–99)
Glucose-Capillary: 185 mg/dL — ABNORMAL HIGH (ref 70–99)
Glucose-Capillary: 171 mg/dL — ABNORMAL HIGH (ref 70–99)
Glucose-Capillary: 177 mg/dL — ABNORMAL HIGH (ref 70–99)
Glucose-Capillary: 168 mg/dL — ABNORMAL HIGH (ref 70–99)

## 2023-04-17 LAB — COOXEMETRY PANEL
Carboxyhemoglobin: 1.4 % (ref 0.5–1.5)
Carboxyhemoglobin: 2 % — ABNORMAL HIGH (ref 0.5–1.5)
Carboxyhemoglobin: 1.7 % — ABNORMAL HIGH (ref 0.5–1.5)
Methemoglobin: <0.7 % (ref 0.0–1.5)
Methemoglobin: <0.7 % (ref 0.0–1.5)
Methemoglobin: <0.7 % (ref 0.0–1.5)
O2 Saturation: 56.3 %
O2 Saturation: 63 %
O2 Saturation: 65.7 %
Total hemoglobin: 8.6 g/dL — ABNORMAL LOW (ref 12.0–16.0)
Total hemoglobin: 8.3 g/dL — ABNORMAL LOW (ref 12.0–16.0)
Total hemoglobin: 8.8 g/dL — ABNORMAL LOW (ref 12.0–16.0)

## 2023-04-17 LAB — POCT ACTIVATED CLOTTING TIME: Activated Clotting Time: 146 s

## 2023-04-17 LAB — MAGNESIUM: Magnesium: 2.4 mg/dL (ref 1.7–2.4)

## 2023-04-17 MED ORDER — INSULIN ASPART 100 UNIT/ML IJ SOLN
0.0000 [IU] | Freq: Three times a day (TID) | INTRAMUSCULAR | Status: DC
  Administered 2023-04-17: 2 [IU] via SUBCUTANEOUS
  Administered 2023-04-17: 3 [IU] via SUBCUTANEOUS

## 2023-04-17 MED ORDER — MILRINONE LACTATE IN DEXTROSE 20-5 MG/100ML-% IV SOLN
0.1250 ug/kg/min | INTRAVENOUS | Status: DC
  Administered 2023-04-17: 0.125 ug/kg/min via INTRAVENOUS
  Filled 2023-04-17 (×2): qty 100

## 2023-04-17 MED ORDER — FENTANYL CITRATE PF 50 MCG/ML IJ SOSY
PREFILLED_SYRINGE | INTRAMUSCULAR | Status: AC
  Administered 2023-04-17: 50 ug
  Filled 2023-04-17: qty 1

## 2023-04-17 MED ORDER — RENA-VITE PO TABS
1.0000 | ORAL_TABLET | Freq: Two times a day (BID) | ORAL | Status: DC
  Administered 2023-04-17: 1 via ORAL
  Filled 2023-04-17: qty 1

## 2023-04-17 MED ORDER — MILRINONE LACTATE IN DEXTROSE 20-5 MG/100ML-% IV SOLN: 0.1250 ug/kg/min | INTRAVENOUS | Status: DC

## 2023-04-17 MED ORDER — SODIUM CHLORIDE 0.9% IV SOLUTION: INTRAVENOUS | Status: DC | PRN

## 2023-04-17 MED ORDER — ALTEPLASE 2 MG IJ SOLR
INTRAMUSCULAR | Status: AC
  Administered 2023-04-17: 2 mg
  Filled 2023-04-17: qty 2

## 2023-04-17 MED ORDER — HYDROMORPHONE HCL 1 MG/ML IJ SOLN: 1.0000 mg | INTRAMUSCULAR | Status: DC | PRN

## 2023-04-17 MED ORDER — ENSURE ENLIVE PO LIQD
237.0000 mL | Freq: Three times a day (TID) | ORAL | Status: DC
  Administered 2023-04-17: 237 mL via ORAL

## 2023-04-17 MED ORDER — HYDROMORPHONE HCL 1 MG/ML IJ SOLN
0.5000 mg | INTRAMUSCULAR | Status: DC | PRN
  Administered 2023-04-17: 0.5 mg via INTRAVENOUS
  Filled 2023-04-17: qty 0.5

## 2023-04-17 MED ORDER — FENTANYL CITRATE PF 50 MCG/ML IJ SOSY
PREFILLED_SYRINGE | INTRAMUSCULAR | Status: AC
  Administered 2023-04-17: 25 ug
  Filled 2023-04-17: qty 1

## 2023-04-17 NOTE — Progress Notes (Signed)
ANTICOAGULATION CONSULT NOTE  Pharmacy Consult for heparin Indication:  Impella  No Known Allergies  Patient Measurements: Height: 6\' 1"  (185.4 cm) Weight: 85.5 kg (188 lb 7.9 oz) IBW/kg (Calculated) : 79.9  Vital Signs: Temp: 98.6 F (37 C) (10/01 0945) Temp Source: Core (10/01 0400) BP: 103/65 (10/01 0930) Pulse Rate: 65 (10/01 0945)  Labs: Recent Labs    04/15/23 0406 04/15/23 1551 04/08/2023 0405 04/09/2023 1528 03/30/2023 1531 03/20/2023 2307 04/17/23 0413 04/17/23 0726  HGB 10.1*  --  9.8* 10.9* 10.9*  --  9.0*  --   HCT 31.0*  --  30.8* 32.0* 32.0*  --  27.2*  --   PLT 112*  --  132*  --   --   --  113*  --   HEPARINUNFRC 0.43  --  0.53  --   --   --   --  0.19*  CREATININE 2.75*   < > 2.75*  --   --  3.63* 3.01*  --    < > = values in this interval not displayed.    Estimated Creatinine Clearance: 21.4 mL/min (A) (by C-G formula based on SCr of 3.01 mg/dL (H)).   Medical History: Past Medical History:  Diagnosis Date   Cancer (HCC) 2019   colon   Chronic kidney disease    Diabetes mellitus without complication (HCC)    diet controlled   Hemodialysis access site with arteriovenous graft (HCC)    Hypertension    Infection due to Clostridium septicum (HCC) 08/27/2018   Renal insufficiency    Pt on dialysis x 3 years and receives every Monday, Wednesday and Friday.     Medications:  Medications Prior to Admission  Medication Sig Dispense Refill Last Dose   acetaminophen (TYLENOL) 325 MG tablet Take 2 tablets (650 mg total) by mouth every 4 (four) hours as needed for mild pain (or temp > 37.5 C (99.5 F)). 20 tablet 0    amoxicillin-clavulanate (AUGMENTIN) 500-125 MG tablet Take 1 tablet by mouth daily. (Patient not taking: Reported on 03/31/2023) 30 tablet 12    aspirin EC 81 MG tablet Take 1 tablet (81 mg total) by mouth daily. Swallow whole. 30 tablet 12    atorvastatin (LIPITOR) 40 MG tablet Take 1 tablet (40 mg total) by mouth daily. (Patient not taking:  Reported on 03/20/2023) 30 tablet 1    B Complex-C-Folic Acid (RENA-VITE RX) 1 MG TABS Take 1 tablet by mouth daily. (Patient not taking: Reported on 04/08/2023)      cinacalcet (SENSIPAR) 60 MG tablet Take 60 mg by mouth daily.      Ergocalciferol (VITAMIN D2) 10 MCG (400 UNIT) TABS Take 1 tab daily (Patient not taking: Reported on 03/27/2023) 30 tablet 0    famotidine (PEPCID) 20 MG tablet Take 20 mg by mouth 2 (two) times daily. 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 (Patient not taking: Reported on 04/02/2023)      ferric citrate (AURYXIA) 1 GM 210 MG(Fe) tablet Take 210 mg by mouth 3 (three) times daily with meals. (Patient not taking: Reported on 03/29/2023)      heparin 42595 UT/250ML infusion Inject 1,100 Units/hr into the vein continuous.      multivitamin (RENA-VIT) TABS tablet Take 1 tablet by mouth daily. (Patient not taking: Reported on 04/13/2023) 30 tablet 11    ondansetron (ZOFRAN) 4 MG tablet Take 4 mg by mouth 3 (three) times daily as needed for nausea/vomiting.      polyethylene glycol powder (MIRALAX) 17 GM/SCOOP powder  Mix full container in 64 ounces of Gatorade or other clear liquid. No Red Liquids 238 g 0    senna-docusate (SENOKOT-S) 8.6-50 MG tablet Take 1 tablet by mouth 2 (two) times daily. 30 tablet 0    Scheduled:   amoxicillin-clavulanate  1 tablet Oral Q12H   aspirin  81 mg Oral Daily   atorvastatin  80 mg Oral Daily   Chlorhexidine Gluconate Cloth  6 each Topical Daily   Chlorhexidine Gluconate Cloth  6 each Topical Q0600   insulin aspart  0-15 Units Subcutaneous TID WC   insulin aspart  0-6 Units Subcutaneous TID WC   multivitamin  1 tablet Per Tube BID   pantoprazole  40 mg Oral Daily   polyethylene glycol  17 g Oral Daily   sodium chloride flush  3 mL Intravenous Q12H   ticagrelor  90 mg Oral BID   Infusions:    prismasol BGK 4/2.5 600 mL/hr at 04/17/23 0129    prismasol BGK 4/2.5 400 mL/hr at 04/15/23 1420   sodium chloride     sodium chloride 10 mL/hr at 04/17/23  0900   amiodarone 30 mg/hr (04/17/23 0900)   anticoagulant sodium citrate     heparin 1,500 Units/hr (04/17/23 0900)   norepinephrine (LEVOPHED) Adult infusion 7 mcg/min (04/17/23 0900)   prismasol BGK 4/2.5 1,000 mL/hr at 04/17/23 0504   sodium bicarbonate 25 mEq (Impella PURGE) in dextrose 5 % 1000 mL bag     vasopressin 0.03 Units/min (04/17/23 0900)    Assessment: 82yo male underwent high risk PCI with impella assisted balloon aortic valvulopasty and orbital atherectomy and cutting balloon angioplasty of LAD/Lcx. Given continued hypotension Impella CP remained in place overnight.   Using bicarb purge solution for Impella (currently running at [facility]). Running heparin systemically. Heparin level came back slightly subtherapeutic at 0.19, on 1500 units/hr. Hgb 9, plt 113. No s/sx of bleeding or infusion issues.   Goal of Therapy:  Heparin level 0.2-0.5 units/ml Monitor platelets by anticoagulation protocol: Yes   Plan:  Increase heparin infusion slightly to 1600 units/hr - possible Impella CP removal today  Monitor heparin levels and CBC.  Thank you for allowing pharmacy to participate in this patient's care,  Sherron Monday, PharmD, BCCCP Clinical Pharmacist  Phone: [phone] 04/17/2023 10:06 AM  Please check AMION for all [facility] Pharmacy phone numbers After 10:00 PM, call Main Pharmacy [phone]

## 2023-04-17 NOTE — Progress Notes (Signed)
Impella CP pulled by Dr. Elwyn Lade at 1714, manual pressure held until 1800. This nurse stayed at bedside to monitor patient and assist MD. This nurse assisted Dr. Elwyn Lade apply femstop. Norepi titrated during manual pressure due to hypotension. No other complications noted.    Christain Sacramento, RN

## 2023-04-17 NOTE — Plan of Care (Signed)
Problem: Education: Goal: Knowledge of General Education information will improve Description: Including pain rating scale, medication(s)/side effects and non-pharmacologic comfort measures Outcome: Progressing   Problem: Health Behavior/Discharge Planning: Goal: Ability to manage health-related needs will improve Outcome: Progressing   Problem: Clinical Measurements: Goal: Ability to maintain clinical measurements within normal limits will improve Outcome: Progressing Goal: Will remain free from infection Outcome: Progressing Goal: Diagnostic test results will improve Outcome: Progressing Goal: Respiratory complications will improve Outcome: Progressing Goal: Cardiovascular complication will be avoided Outcome: Progressing   Problem: Activity: Goal: Risk for activity intolerance will decrease Outcome: Progressing   Problem: Nutrition: Goal: Adequate nutrition will be maintained Outcome: Progressing   Problem: Coping: Goal: Level of anxiety will decrease Outcome: Progressing   Problem: Elimination: Goal: Will not experience complications related to bowel motility Outcome: Progressing Goal: Will not experience complications related to urinary retention Outcome: Progressing   Problem: Pain Managment: Goal: General experience of comfort will improve Outcome: Progressing   Problem: Safety: Goal: Ability to remain free from injury will improve Outcome: Progressing   Problem: Skin Integrity: Goal: Risk for impaired skin integrity will decrease Outcome: Progressing   Problem: Education: Goal: Understanding of CV disease, CV risk reduction, and recovery process will improve Outcome: Progressing Goal: Individualized Educational Video(s) Outcome: Progressing   Problem: Activity: Goal: Ability to return to baseline activity level will improve Outcome: Progressing   Problem: Cardiovascular: Goal: Ability to achieve and maintain adequate cardiovascular perfusion  will improve Outcome: Progressing Goal: Vascular access site(s) Level 0-1 will be maintained Outcome: Progressing   Problem: Health Behavior/Discharge Planning: Goal: Ability to safely manage health-related needs after discharge will improve Outcome: Progressing   Problem: Education: Goal: Knowledge of disease or condition will improve Outcome: Progressing Goal: Knowledge of secondary prevention will improve (MUST DOCUMENT ALL) Outcome: Progressing Goal: Knowledge of patient specific risk factors will improve Loraine Leriche N/A or DELETE if not current risk factor) Outcome: Progressing   Problem: Ischemic Stroke/TIA Tissue Perfusion: Goal: Complications of ischemic stroke/TIA will be minimized Outcome: Progressing   Problem: Coping: Goal: Will verbalize positive feelings about self Outcome: Progressing Goal: Will identify appropriate support needs Outcome: Progressing   Problem: Health Behavior/Discharge Planning: Goal: Ability to manage health-related needs will improve Outcome: Progressing Goal: Goals will be collaboratively established with patient/family Outcome: Progressing   Problem: Self-Care: Goal: Ability to participate in self-care as condition permits will improve Outcome: Progressing Goal: Verbalization of feelings and concerns over difficulty with self-care will improve Outcome: Progressing Goal: Ability to communicate needs accurately will improve Outcome: Progressing   Problem: Nutrition: Goal: Risk of aspiration will decrease Outcome: Progressing Goal: Dietary intake will improve Outcome: Progressing   Problem: Education: Goal: Understanding of cardiac disease, CV risk reduction, and recovery process will improve Outcome: Progressing Goal: Individualized Educational Video(s) Outcome: Progressing   Problem: Activity: Goal: Ability to tolerate increased activity will improve Outcome: Progressing   Problem: Cardiac: Goal: Ability to achieve and maintain  adequate cardiovascular perfusion will improve Outcome: Progressing   Problem: Health Behavior/Discharge Planning: Goal: Ability to safely manage health-related needs after discharge will improve Outcome: Progressing   Problem: Education: Goal: Ability to describe self-care measures that may prevent or decrease complications (Diabetes Survival Skills Education) will improve Outcome: Progressing Goal: Individualized Educational Video(s) Outcome: Progressing   Problem: Coping: Goal: Ability to adjust to condition or change in health will improve Outcome: Progressing   Problem: Fluid Volume: Goal: Ability to maintain a balanced intake and output will improve Outcome: Progressing   Problem: Health  Behavior/Discharge Planning: Goal: Ability to identify and utilize available resources and services will improve Outcome: Progressing Goal: Ability to manage health-related needs will improve Outcome: Progressing   Problem: Metabolic: Goal: Ability to maintain appropriate glucose levels will improve Outcome: Progressing   Problem: Nutritional: Goal: Maintenance of adequate nutrition will improve Outcome: Progressing Goal: Progress toward achieving an optimal weight will improve Outcome: Progressing   Problem: Skin Integrity: Goal: Risk for impaired skin integrity will decrease Outcome: Progressing   Problem: Tissue Perfusion: Goal: Adequacy of tissue perfusion will improve Outcome: Progressing   Problem: Cardiac: Goal: Ability to achieve and maintain adequate cardiopulmonary perfusion will improve Outcome: Progressing Goal: Vascular access site(s) Level 0-1 will be maintained Outcome: Progressing   Problem: Fluid Volume: Goal: Ability to achieve a balanced intake and output will improve Outcome: Progressing   Problem: Physical Regulation: Goal: Complications related to the disease process, condition or treatment will be avoided or minimized Outcome: Progressing    Problem: Respiratory: Goal: Will regain and/or maintain adequate ventilation Outcome: Progressing

## 2023-04-17 NOTE — Progress Notes (Signed)
Advanced Heart Failure Rounding Note  PCP-Cardiologist: Yvonne Kendall, MD   Subjective:     S/p Impella assisted balloon aortic valvuloplasty and PCI/orbital atherectomy/cutting balloon angioplasty and bifurcation stenting LM into LAD and Lcx on 03/21/2023.   Impella CP at P6, flow 3.1L/min NE 5 + Vaso 0.03 CVP 8, PA 66/20 CI 2.2  Tolerated CVVH. 2.5L volume removed yesterday.  Feeling well. No dyspnea.  Very appreciative of care.   Objective:   Weight Range: 85.5 kg Body mass index is 24.87 kg/m.   Vital Signs:   Temp:  [97.8 F (36.6 C)-99.1 F (37.3 C)] 98.8 F (37.1 C) (10/01 0800) Pulse Rate:  [0-76] 64 (10/01 0800) Resp:  [10-26] 14 (10/01 0800) BP: (94-121)/(46-64) 103/62 (10/01 0800) SpO2:  [81 %-100 %] 98 % (10/01 0800) Arterial Line BP: (106-140)/(37-73) 124/49 (10/01 0800) Weight:  [85.5 kg] 85.5 kg (10/01 0500) Last BM Date : 04/15/23  Weight change: Filed Weights   04/15/23 0600 04/14/2023 0615 04/17/23 0500  Weight: 88.6 kg 85.8 kg 85.5 kg    Intake/Output:   Intake/Output Summary (Last 24 hours) at 04/17/2023 0907 Last data filed at 04/17/2023 0800 Gross per 24 hour  Intake 1114.4 ml  Output 2114 ml  Net -999.6 ml      Physical Exam    General:  Well appearing elderly male HEENT: Normal Neck: Supple. R internal jugular SWAN + L internal jugular trialysis catheter. Cor: PMI nondisplaced. Regular rate & rhythm. No rubs, gallops, 3/6 AS murmur Lungs: Clear Abdomen: Soft, nontender, nondistended.  Extremities: No cyanosis, clubbing, rash, edema, R femoral impella Neuro: Alert & orientedx3. Affect pleasant   Telemetry   SR 60s   Labs    CBC Recent Labs    03/23/2023 0405 04/06/2023 1528 04/03/2023 1531 04/17/23 0413  WBC 14.9*  --   --  14.5*  HGB 9.8*   < > 10.9* 9.0*  HCT 30.8*   < > 32.0* 27.2*  MCV 97.8  --   --  96.5  PLT 132*  --   --  113*   < > = values in this interval not displayed.   Basic Metabolic Panel Recent  Labs    04/04/2023 0405 04/08/2023 1528 04/15/2023 2307 04/17/23 0413  NA 133*   < > 133* 133*  K 4.7   < > 5.0 4.8  CL 100  --  99 100  CO2 24  --  21* 22  GLUCOSE 173*  --  167* 163*  BUN 17  --  26* 24*  CREATININE 2.75*  --  3.63* 3.01*  CALCIUM 8.8*  --  9.0 8.8*  MG 2.5*  --   --  2.4  PHOS 2.8  --  4.3 3.8   < > = values in this interval not displayed.   Liver Function Tests Recent Labs    03/26/2023 2307 04/17/23 0413  ALBUMIN 2.9* 3.1*   No results for input(s): "LIPASE", "AMYLASE" in the last 72 hours. Cardiac Enzymes No results for input(s): "CKTOTAL", "CKMB", "CKMBINDEX", "TROPONINI" in the last 72 hours.  BNP: BNP (last 3 results) Recent Labs    04/09/23 2130  BNP 3,735.5*    ProBNP (last 3 results) No results for input(s): "PROBNP" in the last 8760 hours.   D-Dimer No results for input(s): "DDIMER" in the last 72 hours. Hemoglobin A1C No results for input(s): "HGBA1C" in the last 72 hours. Fasting Lipid Panel No results for input(s): "CHOL", "HDL", "LDLCALC", "TRIG", "CHOLHDL", "LDLDIRECT" in the  Right ventricular pressure 76/0 with an end-diastolic pressure of 11 mmHg  Pulmonary artery pressure of 85/23 with a mean of 44 mmHg  Wedge pressure mean of 20 mmHg  Pulmonary vascular resistance of 3.5 Woods units  Pulmonary artery pulsatility index of 5.6 3.  LVEDP of 16 mmHg 4.  Successful Impella assisted balloon aortic valvuloplasty with a 10 mm balloon. 5.  Successful complex high risk PCI with Impella support, orbital atherectomy of LAD and left circumflex guided by OCT, Cutting Balloon angioplasty of LAD and left circumflex, and DK Nano crush two stent bifurcation strategy. 6.  Given hypotension from sedation, planned Impella removal was aborted and the Impella will remain in place at least overnight.  This was discussed with advanced heart failure. Recommendation: Monitor in unit and wean Impella as tolerated; if the patient is able to wean off the Impella and can convert to conventional hemodialysis, will obtain TAVR protocol CTA this admission for possible TAVR in the near future.  Result Date: 04/17/2023   Mid LM to Ost LAD lesion is 50% stenosed with 95% stenosed side branch in Ost Cx.   Prox LAD to Mid LAD lesion is 40% stenosed.   Ost LAD lesion is 90% stenosed.   Prox RCA lesion is 60% stenosed.   1st Mrg lesion is 40% stenosed.   2nd Mrg lesion is 50% stenosed.   RPAV lesion is 90% stenosed.   A stent was successfully placed.   A stent was successfully placed.   Post intervention, there is a 0% residual  stenosis.   Post intervention, there is a 0% residual stenosis.   Post intervention, there is a 0% residual stenosis.   Post intervention, the side branch was reduced to 0% residual stenosis. 1.  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 2.  Fick cardiac output of 6.9 L/min and cardiac index of 3.2 L/min/m prior to Impella placement while on norepinephrine and vasopressin with the following hemodynamics:  Right atrial pressure mean of 11 mmHg  Right ventricular pressure 76/0 with an end-diastolic pressure of 11 mmHg  Pulmonary artery pressure of 85/23 with a mean of 44 mmHg  Wedge pressure mean of 20 mmHg  Pulmonary vascular resistance of 3.5 Woods units  Pulmonary artery pulsatility index of 5.6 3.  LVEDP of 16 mmHg 4.  Successful Impella assisted balloon aortic valvuloplasty with a 10 mm balloon. 5.  Successful complex high risk PCI with Impella support, orbital atherectomy of LAD and left circumflex guided by OCT, Cutting Balloon angioplasty of LAD and left circumflex, and DK Nano crush two stent bifurcation strategy. 6.  Given hypotension from sedation, plan Impella removal was aborted and the Impella will remain in place at least overnight.  This was discussed with advanced heart failure. Recommendation: Monitor in unit and wean Impella as tolerated; if the patient is able to wean off the Impella and can convert to conventional hemodialysis, will obtain TAVR protocol CTA this admission for possible TAVR in the near future.   DG CHEST PORT 1 VIEW  Result Date: 04/02/2023 CLINICAL DATA:  Hypertension chronic kidney disease EXAM: PORTABLE CHEST 1 VIEW COMPARISON:  04/12/2023 FINDINGS: Left-sided central venous catheter tip at the SVC. Cardiomegaly with vascular congestion and interstitial pulmonary edema similar compared to prior. No pleural effusion or  pneumothorax. Stent in the left subclavian/axillary region. IMPRESSION: Cardiomegaly with vascular congestion and mild interstitial  Right ventricular pressure 76/0 with an end-diastolic pressure of 11 mmHg  Pulmonary artery pressure of 85/23 with a mean of 44 mmHg  Wedge pressure mean of 20 mmHg  Pulmonary vascular resistance of 3.5 Woods units  Pulmonary artery pulsatility index of 5.6 3.  LVEDP of 16 mmHg 4.  Successful Impella assisted balloon aortic valvuloplasty with a 10 mm balloon. 5.  Successful complex high risk PCI with Impella support, orbital atherectomy of LAD and left circumflex guided by OCT, Cutting Balloon angioplasty of LAD and left circumflex, and DK Nano crush two stent bifurcation strategy. 6.  Given hypotension from sedation, planned Impella removal was aborted and the Impella will remain in place at least overnight.  This was discussed with advanced heart failure. Recommendation: Monitor in unit and wean Impella as tolerated; if the patient is able to wean off the Impella and can convert to conventional hemodialysis, will obtain TAVR protocol CTA this admission for possible TAVR in the near future.  Result Date: 04/17/2023   Mid LM to Ost LAD lesion is 50% stenosed with 95% stenosed side branch in Ost Cx.   Prox LAD to Mid LAD lesion is 40% stenosed.   Ost LAD lesion is 90% stenosed.   Prox RCA lesion is 60% stenosed.   1st Mrg lesion is 40% stenosed.   2nd Mrg lesion is 50% stenosed.   RPAV lesion is 90% stenosed.   A stent was successfully placed.   A stent was successfully placed.   Post intervention, there is a 0% residual  stenosis.   Post intervention, there is a 0% residual stenosis.   Post intervention, there is a 0% residual stenosis.   Post intervention, the side branch was reduced to 0% residual stenosis. 1.  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 2.  Fick cardiac output of 6.9 L/min and cardiac index of 3.2 L/min/m prior to Impella placement while on norepinephrine and vasopressin with the following hemodynamics:  Right atrial pressure mean of 11 mmHg  Right ventricular pressure 76/0 with an end-diastolic pressure of 11 mmHg  Pulmonary artery pressure of 85/23 with a mean of 44 mmHg  Wedge pressure mean of 20 mmHg  Pulmonary vascular resistance of 3.5 Woods units  Pulmonary artery pulsatility index of 5.6 3.  LVEDP of 16 mmHg 4.  Successful Impella assisted balloon aortic valvuloplasty with a 10 mm balloon. 5.  Successful complex high risk PCI with Impella support, orbital atherectomy of LAD and left circumflex guided by OCT, Cutting Balloon angioplasty of LAD and left circumflex, and DK Nano crush two stent bifurcation strategy. 6.  Given hypotension from sedation, plan Impella removal was aborted and the Impella will remain in place at least overnight.  This was discussed with advanced heart failure. Recommendation: Monitor in unit and wean Impella as tolerated; if the patient is able to wean off the Impella and can convert to conventional hemodialysis, will obtain TAVR protocol CTA this admission for possible TAVR in the near future.   DG CHEST PORT 1 VIEW  Result Date: 04/02/2023 CLINICAL DATA:  Hypertension chronic kidney disease EXAM: PORTABLE CHEST 1 VIEW COMPARISON:  04/12/2023 FINDINGS: Left-sided central venous catheter tip at the SVC. Cardiomegaly with vascular congestion and interstitial pulmonary edema similar compared to prior. No pleural effusion or  pneumothorax. Stent in the left subclavian/axillary region. IMPRESSION: Cardiomegaly with vascular congestion and mild interstitial  Advanced Heart Failure Rounding Note  PCP-Cardiologist: Yvonne Kendall, MD   Subjective:     S/p Impella assisted balloon aortic valvuloplasty and PCI/orbital atherectomy/cutting balloon angioplasty and bifurcation stenting LM into LAD and Lcx on 03/21/2023.   Impella CP at P6, flow 3.1L/min NE 5 + Vaso 0.03 CVP 8, PA 66/20 CI 2.2  Tolerated CVVH. 2.5L volume removed yesterday.  Feeling well. No dyspnea.  Very appreciative of care.   Objective:   Weight Range: 85.5 kg Body mass index is 24.87 kg/m.   Vital Signs:   Temp:  [97.8 F (36.6 C)-99.1 F (37.3 C)] 98.8 F (37.1 C) (10/01 0800) Pulse Rate:  [0-76] 64 (10/01 0800) Resp:  [10-26] 14 (10/01 0800) BP: (94-121)/(46-64) 103/62 (10/01 0800) SpO2:  [81 %-100 %] 98 % (10/01 0800) Arterial Line BP: (106-140)/(37-73) 124/49 (10/01 0800) Weight:  [85.5 kg] 85.5 kg (10/01 0500) Last BM Date : 04/15/23  Weight change: Filed Weights   04/15/23 0600 04/14/2023 0615 04/17/23 0500  Weight: 88.6 kg 85.8 kg 85.5 kg    Intake/Output:   Intake/Output Summary (Last 24 hours) at 04/17/2023 0907 Last data filed at 04/17/2023 0800 Gross per 24 hour  Intake 1114.4 ml  Output 2114 ml  Net -999.6 ml      Physical Exam    General:  Well appearing elderly male HEENT: Normal Neck: Supple. R internal jugular SWAN + L internal jugular trialysis catheter. Cor: PMI nondisplaced. Regular rate & rhythm. No rubs, gallops, 3/6 AS murmur Lungs: Clear Abdomen: Soft, nontender, nondistended.  Extremities: No cyanosis, clubbing, rash, edema, R femoral impella Neuro: Alert & orientedx3. Affect pleasant   Telemetry   SR 60s   Labs    CBC Recent Labs    03/23/2023 0405 04/06/2023 1528 04/03/2023 1531 04/17/23 0413  WBC 14.9*  --   --  14.5*  HGB 9.8*   < > 10.9* 9.0*  HCT 30.8*   < > 32.0* 27.2*  MCV 97.8  --   --  96.5  PLT 132*  --   --  113*   < > = values in this interval not displayed.   Basic Metabolic Panel Recent  Labs    04/04/2023 0405 04/08/2023 1528 04/15/2023 2307 04/17/23 0413  NA 133*   < > 133* 133*  K 4.7   < > 5.0 4.8  CL 100  --  99 100  CO2 24  --  21* 22  GLUCOSE 173*  --  167* 163*  BUN 17  --  26* 24*  CREATININE 2.75*  --  3.63* 3.01*  CALCIUM 8.8*  --  9.0 8.8*  MG 2.5*  --   --  2.4  PHOS 2.8  --  4.3 3.8   < > = values in this interval not displayed.   Liver Function Tests Recent Labs    03/26/2023 2307 04/17/23 0413  ALBUMIN 2.9* 3.1*   No results for input(s): "LIPASE", "AMYLASE" in the last 72 hours. Cardiac Enzymes No results for input(s): "CKTOTAL", "CKMB", "CKMBINDEX", "TROPONINI" in the last 72 hours.  BNP: BNP (last 3 results) Recent Labs    04/09/23 2130  BNP 3,735.5*    ProBNP (last 3 results) No results for input(s): "PROBNP" in the last 8760 hours.   D-Dimer No results for input(s): "DDIMER" in the last 72 hours. Hemoglobin A1C No results for input(s): "HGBA1C" in the last 72 hours. Fasting Lipid Panel No results for input(s): "CHOL", "HDL", "LDLCALC", "TRIG", "CHOLHDL", "LDLDIRECT" in the  Advanced Heart Failure Rounding Note  PCP-Cardiologist: Yvonne Kendall, MD   Subjective:     S/p Impella assisted balloon aortic valvuloplasty and PCI/orbital atherectomy/cutting balloon angioplasty and bifurcation stenting LM into LAD and Lcx on 03/21/2023.   Impella CP at P6, flow 3.1L/min NE 5 + Vaso 0.03 CVP 8, PA 66/20 CI 2.2  Tolerated CVVH. 2.5L volume removed yesterday.  Feeling well. No dyspnea.  Very appreciative of care.   Objective:   Weight Range: 85.5 kg Body mass index is 24.87 kg/m.   Vital Signs:   Temp:  [97.8 F (36.6 C)-99.1 F (37.3 C)] 98.8 F (37.1 C) (10/01 0800) Pulse Rate:  [0-76] 64 (10/01 0800) Resp:  [10-26] 14 (10/01 0800) BP: (94-121)/(46-64) 103/62 (10/01 0800) SpO2:  [81 %-100 %] 98 % (10/01 0800) Arterial Line BP: (106-140)/(37-73) 124/49 (10/01 0800) Weight:  [85.5 kg] 85.5 kg (10/01 0500) Last BM Date : 04/15/23  Weight change: Filed Weights   04/15/23 0600 04/14/2023 0615 04/17/23 0500  Weight: 88.6 kg 85.8 kg 85.5 kg    Intake/Output:   Intake/Output Summary (Last 24 hours) at 04/17/2023 0907 Last data filed at 04/17/2023 0800 Gross per 24 hour  Intake 1114.4 ml  Output 2114 ml  Net -999.6 ml      Physical Exam    General:  Well appearing elderly male HEENT: Normal Neck: Supple. R internal jugular SWAN + L internal jugular trialysis catheter. Cor: PMI nondisplaced. Regular rate & rhythm. No rubs, gallops, 3/6 AS murmur Lungs: Clear Abdomen: Soft, nontender, nondistended.  Extremities: No cyanosis, clubbing, rash, edema, R femoral impella Neuro: Alert & orientedx3. Affect pleasant   Telemetry   SR 60s   Labs    CBC Recent Labs    03/23/2023 0405 04/06/2023 1528 04/03/2023 1531 04/17/23 0413  WBC 14.9*  --   --  14.5*  HGB 9.8*   < > 10.9* 9.0*  HCT 30.8*   < > 32.0* 27.2*  MCV 97.8  --   --  96.5  PLT 132*  --   --  113*   < > = values in this interval not displayed.   Basic Metabolic Panel Recent  Labs    04/04/2023 0405 04/08/2023 1528 04/15/2023 2307 04/17/23 0413  NA 133*   < > 133* 133*  K 4.7   < > 5.0 4.8  CL 100  --  99 100  CO2 24  --  21* 22  GLUCOSE 173*  --  167* 163*  BUN 17  --  26* 24*  CREATININE 2.75*  --  3.63* 3.01*  CALCIUM 8.8*  --  9.0 8.8*  MG 2.5*  --   --  2.4  PHOS 2.8  --  4.3 3.8   < > = values in this interval not displayed.   Liver Function Tests Recent Labs    03/26/2023 2307 04/17/23 0413  ALBUMIN 2.9* 3.1*   No results for input(s): "LIPASE", "AMYLASE" in the last 72 hours. Cardiac Enzymes No results for input(s): "CKTOTAL", "CKMB", "CKMBINDEX", "TROPONINI" in the last 72 hours.  BNP: BNP (last 3 results) Recent Labs    04/09/23 2130  BNP 3,735.5*    ProBNP (last 3 results) No results for input(s): "PROBNP" in the last 8760 hours.   D-Dimer No results for input(s): "DDIMER" in the last 72 hours. Hemoglobin A1C No results for input(s): "HGBA1C" in the last 72 hours. Fasting Lipid Panel No results for input(s): "CHOL", "HDL", "LDLCALC", "TRIG", "CHOLHDL", "LDLDIRECT" in the

## 2023-04-17 NOTE — Progress Notes (Signed)
[facility] NEPHROLOGY PROGRESS NOTE  Subjective: seen in room. No c/o's.   Objective Vital signs in last 24 hours: Vitals:   04/17/23 1445 04/17/23 1500 04/17/23 1515 04/17/23 1530  BP:  (!) 106/50  (!) 110/54  Pulse: 69 67 68 69  Resp: 15 19 19 19   Temp: 98.1 F (36.7 C) 98.1 F (36.7 C) 97.9 F (36.6 C) 97.9 F (36.6 C)  TempSrc:      SpO2: 91% 93% 93% 94%  Weight:      Height:       Physical Exam: General:NAD, comfortable Heart:RRR, s1s2 nl Lungs:clear b/l, no crackle Abdomen:soft, Non-tender, non-distended Extremities:No edema Dialysis Access: AV fistula with good thrill and bruit.  Left IJ temporary HD catheter placed by PCCM on 9/26.   OP HD: [place] [facility] MWF  4h  400/1.5   88.9kg   2/2.5 bath  LUA AVF  15ga  Hep 2000 - sensipar 60 mg every day - velphoro 500 mg ac tid - nephrovite    Summary Pt is a 82 y.o. yo male  with past medical history significant for hypertension, DM,  CHF with EF of 20 to 25%, stroke, ESRD on HD MWF, colon cancer status post hemicolectomy who was transferred from [facility] for an NSTEMI, seen as a consultation for the management of ESRD.    Assessment/ Plan # Acute on chronic CHF/ EF of 20% / CAD/ NSTEMI/ severe AS - is now s/p Impella assisted aortic valvuloplasty and high risk PCI/atherectomy to the left main/LAD/LCx. CVP 6-8 this am. Cards added milrinone. Cont UF 50- 100 cc/hr.    # ESRD: MWF at [facility] [place]. Patient received conventional HD on 9/25 with 500 cc complicated by IDH.  CRRT started on 9/26, cont for now. UF as tolerated, all 4K bath w/ circuit heparin.    # Shock/volume: remains on Levophed, vasopressin gtt. Euvolemic on exam. Is 4-5 kg under his dry wt. CXR's 9/26 and 05-01-2023 show bilat pulm edema. Cont to lower vol w/ CRRT.    # Anemia of ESRD: Hemoglobin at goal.  No need for ESA.  Try to hold ESA in the setting of CAD.   # CKD-metabolic Bone Disease: DC Auryxia as patient is on CRRT.  Vinson Moselle  MD   [facility] 04/17/2023, 3:58 PM  Recent Labs  Lab 05/01/23 1531 05-01-23 2307 04/17/23 0413  HGB 10.9*  --  9.0*  ALBUMIN  --  2.9* 3.1*  CALCIUM  --  9.0 8.8*  PHOS  --  4.3 3.8  CREATININE  --  3.63* 3.01*  K 4.3 5.0 4.8    Inpatient medications:  amoxicillin-clavulanate  1 tablet Oral Q12H   aspirin  81 mg Oral Daily   atorvastatin  80 mg Oral Daily   Chlorhexidine Gluconate Cloth  6 each Topical Daily   Chlorhexidine Gluconate Cloth  6 each Topical Q0600   feeding supplement  237 mL Oral TID BM   insulin aspart  0-15 Units Subcutaneous TID WC   insulin aspart  0-6 Units Subcutaneous TID WC   multivitamin  1 tablet Oral BID   pantoprazole  40 mg Oral Daily   polyethylene glycol  17 g Oral Daily   sodium chloride flush  3 mL Intravenous Q12H   ticagrelor  90 mg Oral BID     prismasol BGK 4/2.5 600 mL/hr at 04/17/23 1015    prismasol BGK 4/2.5 400 mL/hr at 04/17/23 1017   sodium chloride     sodium chloride  10 mL/hr at 04/17/23 1500   amiodarone 30 mg/hr (04/17/23 1500)   anticoagulant sodium citrate     heparin 1,600 Units/hr (04/17/23 1500)   milrinone 0.125 mcg/kg/min (04/17/23 1500)   norepinephrine (LEVOPHED) Adult infusion 7 mcg/min (04/17/23 1500)   prismasol BGK 4/2.5 1,000 mL/hr at 04/17/23 0504   sodium bicarbonate 25 mEq (Impella PURGE) in dextrose 5 % 1000 mL bag     vasopressin 0.02 Units/min (04/17/23 1500)   sodium chloride, Place/Maintain arterial line **AND** sodium chloride, sodium chloride, acetaminophen, alteplase, anticoagulant sodium citrate, heparin, heparin, heparin, lidocaine (PF), lidocaine-prilocaine, nitroGLYCERIN, ondansetron (ZOFRAN) IV, mouth rinse, pentafluoroprop-tetrafluoroeth, senna, sodium chloride flush

## 2023-04-17 NOTE — Progress Notes (Addendum)
eLink Physician-Brief Progress Note Patient Name: Jonathan Mcgrath DOB: 06/24/1941 MRN: [id number]   Date of Service  04/17/2023  HPI/Events of Note  82 year old man admitted to [facility] CVICU 9/24 as a transfer from [facility] for NSTEMI and low flow state status post Impella and removal.  On CRRT.  Has pain at the Impella removal site without evidence of hematoma, pain not responsive to Tylenol.  Overall rising pressor requirements.  CVP stable.  Cardiac index stable.  eICU Interventions  Defer pressor/inotropic management to cardiology.  Add Dilaudid sliding scale in addition to the as needed Tylenol.  Maintain extremity watch and straight leg for the time being per preprocedure guidelines   2344 - WBC 14.5 -> 25.8.  Continue monitoring.   8295 -patient is now on 45 mcg of norepinephrine, vasopressin 0.03, and remains on milrinone 0.125.  CRRT now off due to clotting after emesis episode.  Lines are currently indwelling and will attempt to resume CRRT.  Although concern is patient may need additional inotropic support.  Epi versus increasing milrinone.  CVP uptrending, index slightly lower, PA pressures climbing.  Pending cardiology conversation.  With regards to aspiration, the patient is adequately covered with antibiotics and no immediate interventions required.  Procalcitonin remains indeterminate at 0.85 although difficult to interpret in the setting of renal replacement therapy.  6213 -patient had a brief cardiac arrest.  Patient is no compressions, okay to intubate and shock.  Attempt was made that epinephrine and x 2, bag mask ventilation was initiated.  Unfortunately despite these interventions no significant change in rhythm.  One-time shock was delivered at the onset of VT/V-fib.  Ground team evaluated the patient and he remained pulseless.  I called the patient's daughter who confirmed that she did not want to pursue ongoing chemical code or invasive mechanical ventilation with endotracheal  tube.  As such, CODE STATUS was updated in the chart and bedside team pronounced the patient.  Cardiology was notified via pager.   Intervention Category Intermediate Interventions: Pain - evaluation and management  Sumaya Riedesel 04/17/2023, 9:35 PM

## 2023-04-17 NOTE — Progress Notes (Signed)
Daily Progress Note   Patient Name: Jonathan Mcgrath       Date: 04/17/2023 DOB: 10-Nov-1940  Age: 82 y.o. MRN#: [id number] Attending Physician: Orbie Pyo, MD Primary Care Physician: Estell Harpin, MD Admit Date: 04/12/2023 Length of Stay: 7 days  Reason for Consultation/Follow-up: Establishing goals of care  HPI/Patient Profile:  82 y.o. 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 He was admitted to [facility] CVICU 9/24 as a transfer from [facility]  on 04/04/2023 with NSTEMI and low flow state.   Subjective:   Subjective: Chart Reviewed. Updates received. Patient Assessed. Created space and opportunity for patient  and family to explore thoughts and feelings regarding current medical situation.  Today's Discussion: Today saw the patient at the bedside and he states that overall he is doing okay.  He feels the procedure went well, notes that he was "awake all the time."  He denies pain in general including abdominal pain, chest pain.  He denies shortness of breath as well.  States he was having nausea and nursing notes that he projectile vomited last night post procedure, but after receiving medication he has not had a recurrence of his nausea and vomiting.  We discussed the possibility of sedative medications causing his upset stomach.  He notes his symptoms of not been here today, but thinks that somebody has told him that they are coming in a little bit.  We talked about the need to lie flat with the Impella device still implanted.  Per nursing, possibly removing Impella later this evening which would allow him to sit up a little more.  We also discussed the inability to get up and walk around even after Impella is removed because of CRRT.  We also discussed that there is the possibility of another procedure to further help  his heart valves.  He states that his family thought "he was on his way out" but he is happy that he did well.  We discussed that we will continue to hope and pray for the best but it was good for [place] to have conversations about what to do if things do not go well.  He agrees.  He states he has had a lot of prayers coming his way and he is very thankful for this.  Overall he seems to be in very good spirits.  I also reached out and spoke with the patient's son/HCPOA [name].  I updated him on his father's condition.  He states he stated to the procedure last night and went home to get some rest.  Nursing has said that the patient needs to rest is much as possible today and so he is not going to come back to the hospital.  His sister is going to come briefly later this afternoon but otherwise family will come back tomorrow.  I ensured that he had our contact information for any questions or concerns.  I provided emotional and general support through therapeutic listening, empathy, sharing of stories, therapeutic touch, and other techniques. I answered all questions and addressed all concerns to the best of my ability.  Review of Systems  Constitutional:  Positive for fatigue.       Denies pain in general  Respiratory:  Negative for chest tightness and shortness of breath (earlier in the day, now improved).   Cardiovascular:  Negative for chest pain.  Gastrointestinal:  Positive for nausea (earlier, but now passed) and vomiting (last night/post-procedure). Negative for abdominal pain.    Objective:   Vital Signs:  BP 103/65   Pulse 65   Temp 98.6 F (37 C)   Resp 19   Ht 6\' 1"  (1.854 m)   Wt 85.5 kg   SpO2 100%   BMI 24.87 kg/m   Physical Exam: Physical Exam Vitals and nursing note reviewed.  Constitutional:      General: He is not in acute distress.    Appearance: He is not toxic-appearing.  HENT:     Head: Normocephalic and atraumatic.  Cardiovascular:     Rate and Rhythm:  Normal rate and regular rhythm.  Pulmonary:     Effort: Pulmonary effort is normal. No respiratory distress.     Breath sounds: Normal breath sounds. No wheezing or rhonchi.  Abdominal:     General: Abdomen is flat. Bowel sounds are normal. There is no distension.     Palpations: Abdomen is soft.     Tenderness: There is no abdominal tenderness.  Skin:    General: Skin is warm and dry.  Neurological:     General: No focal deficit present.     Mental Status: He is alert.  Psychiatric:        Mood and Affect: Mood normal.        Behavior: Behavior normal.     Palliative Assessment/Data: 40% (bedbound due to CRRT line/Impella)    Existing Vynca/ACP Documentation: Advanced directive signed 04/12/23  Assessment & Plan:   Impression: Present on Admission:  NSTEMI (non-ST elevated myocardial infarction) [facility])  82 year old male with chronic comorbidities and acute presentations as described above. The patient decided to proceed with Impella supported PCI, possibly TAVR at a later date which he understood to be high risk.  It appears that the Impella supported PCI went well.  He remains on Impella and CRRT, though Impella may be removed later today.  It appears the procedure went well and the patient is in no discomfort and in good spirits.  Family is also thankful for success thus far and continue to pray for him for continued recovery and lean on their faith.  He has completed HCPOA paperwork and is designated his son [name] is power of attorney. They have taken the opportunity to have discussions amongst themselves to understand the patient's wishes should he end up in a poor clinical state and unable to express his own wishes.  Overall prognosis is guarded.  SUMMARY OF RECOMMENDATIONS   Remain DNR-interventions desired Continue encourage intrafamily communication on wishes moving forward Continued spiritual care for support Continued emotional spiritual support of patient and  family Goals are clear at this time Palliative medicine will continue to follow for discussions as needed based on clinical progression  Symptom Management:  Per primary team PMT is available to assist as needed  Code Status: DNR-Interventions desired  Prognosis: Unable to determine  Discharge Planning: To Be Determined  Discussed with: Patient, medical team, nursing team, family  Thank you for allowing [place] to participate in the care of KEATS CAMERINO PMT will continue to support holistically.  Time Total: 60 min  Detailed review of medical records (labs, imaging, vital signs), medically appropriate exam, discussed with treatment team, counseling and education to patient, family, & staff, documenting clinical information, medication management, coordination of care  Wynne Dust, NP Palliative Medicine Team  Team Phone # [phone] (Nights/Weekends)  03/15/2021, 8:17  AM

## 2023-04-17 NOTE — Progress Notes (Addendum)
Impella weaned to P2 earlier this afternoon while on 0.172mcg/kg/min of milrinone support. Coox remained stable in the 60s with CI >2.5. Heparin was turned off after impella was placed at P6, ACT was checked 45 minutes later and was 146. Impella was turned to P0 and pulled at bedside with 45 minutes of manual pressure. After 45 minutes, site was soft without evidence of hematoma. Femstop left in place, 6 hours bed rest.   CRITICAL CARE Performed by: Romie Minus   Total critical care time: 60 minutes  Critical care time was exclusive of separately billable procedures and treating other patients.  Critical care was necessary to treat or prevent imminent or life-threatening deterioration.  Critical care was time spent personally by me on the following activities: development of treatment plan with patient and/or surrogate as well as nursing, discussions with consultants, evaluation of patient's response to treatment, examination of patient, obtaining history from patient or surrogate, ordering and performing treatments and interventions, ordering and review of laboratory studies, ordering and review of radiographic studies, pulse oximetry and re-evaluation of patient's condition.

## 2023-04-17 NOTE — Progress Notes (Signed)
Wasted fentanyl in stericycle witnessed by Clearnce Hasten, RN.    Christain Sacramento, RN

## 2023-04-17 NOTE — Progress Notes (Signed)
NAME:  Jonathan Mcgrath, MRN:  [id number], DOB:  [birth year], LOS: 7 ADMISSION DATE:  April 30, 2023, CONSULTATION DATE:  April 30, 2023 REFERRING MD:  End - [facility] CHIEF COMPLAINT:  Weakness, NSTEMI  History of Present Illness:  82 year old man admitted to [facility] CVICU Apr 30, 2023 as a transfer from [facility] for NSTEMI and low flow state. 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   Patient was initially admitted to [facility] 9/23 with weakness x 1 week. He was found to have NSTEMI and acute HFrEF c/b low flow/low gradient AS. He was started on Heparin infusion (of note, history of vitreous hemorrhage of eyes 3 months ago; risks/benefits of heparin were apparently discussed with ophthalmology and it was felt benefit outweighed risk). Plan was for volume removal via HD given his ESRD status.  Echo April 30, 2023 demonstrated EF 20-25% with global hypokinesis and septal dyskinesis related to LBBB, mildly dilated RV with mod reduced fx, mod to severe PAH, small pericardial effusion, AV severely thickened with TR, mod to severe TR and mod MR. Patient was taken for [facility] Apr 30, 2023 which demonstrated critical ostial LAD/LCx disease with 90-95%, mod proximal RCA and severe RPAV disease, severely elevated L and R filling pressures, severe PAH. HD was attempted 2023-04-30, however due to hypotension patient was only able to tolerate 2L fluid removal prior to cessation.  Decision to transfer patient to [facility] for further Cardiology input, TCTS consultation and possible initiation of CRRT initiation. PCCM consulted 30-Apr-2023 for assistance with management.  Pertinent Medical History:   Past Medical History:  Diagnosis Date   Cancer (HCC) 2019   colon   Chronic kidney disease    Diabetes mellitus without complication (HCC)    diet controlled   Hemodialysis access site with arteriovenous graft (HCC)     Hypertension    Infection due to Clostridium septicum (HCC) 08/27/2018   Renal insufficiency    Pt on dialysis x 3 years and receives every Monday, Wednesday and Friday.    Significant Hospital Events: Including procedures, antibiotic start and stop dates in addition to other pertinent events   9/23 Admit to [facility] 2023-04-30 L/RHC with findings of critical ostial LAD/LCx disease with 90-95%, mod proximal RCA and severe RPAV disease, severely elevated L and R filling pressures, severe PAH.  Echo 04/30/23 > EF 20-25% with global HK and septal dyskinesis related to LBBB, mildly dilated RV with mod reduced fx, mod to severe PAH, small pericardial effusion, AV severely thickened with TR, mod to severe TR and mod MR.Transfer to [facility] CVICU. 9/25 DNR, aggressive medical care desired. iHD trial 9/26 palliative consult. A lot of back and forth by pt re how he wants to proceed and ultimately he has decided to proceed with offered high risk cardiac intervention + requisite optimization preceding. 9/27 started on CRRT for additional volume removal, advanced heart failure team consulted, with plan put in place for high risk PCI with Impella support after medical optimization.  Deemed not a surgical candidate per TCTS, right radial A-line placed by cardiology 9/28: Increased ectopy, added amiodarone, also vasopressin added to norepinephrine in hopes of decreasing norepinephrine dose requirements 9/29: Tolerating volume removal via CRRT, still volume overloaded but ectopy improving.  UF goals increased 9/30 went to Cath Lab had successful.  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  RAP 11 mmHg pulmonary artery  pressures 85/23 wedge pressure 20.  Underwent successful Impella assisted balloon valvuloplasty, orbital arthrectomy of the LAD and left circumflex guided by OCT, Cutting Balloon angioplasty of the LAD and left circumflex, DK Nano crush stent x 2.  Impella left in place given  shock state postprocedure  Interim History / Subjective:  He denies chest pain.  Denies shortness of breath Objective:  Blood pressure (!) 104/56, pulse 64, temperature 98.6 F (37 C), resp. rate 18, height 6\' 1"  (1.854 m), weight 85.5 kg, SpO2 99%. PAP: (67-88)/(16-31) 71/22 CVP:  [2 mmHg-10 mmHg] 8 mmHg      Intake/Output Summary (Last 24 hours) at 04/17/2023 0739 Last data filed at 04/17/2023 0700 Gross per 24 hour  Intake 1147.17 ml  Output 2451 ml  Net -1303.83 ml   Filed Weights   04/15/23 0600 04/09/2023 0615 04/17/23 0500  Weight: 88.6 kg 85.8 kg 85.5 kg    Physical Examination: General 82 year old male patient lying in bed currently no acute distress. HEENT normocephalic atraumatic Pacemaker on right neck, HD cath left neck.  Mucous membranes moist Pulmonary clear to auscultation diminished bases currently on nasal cannula 2 L/min Cardiac holosystolic heart murmur consistent with his aortic stenosis Extremities are cool, pulses Doppler, no edema left leg immobilized Neuro intact GU due to void    Resolved problem list  Constipation Nausea  Assessment & Plan:  S/p Status post Impella assisted balloon aortic valvuloplasty, orbital arthrectomy of LAD and left circumflex, Cutting Balloon angioplasty of LAD and left circumflex and stent placement w/ Temporary pacemaker for Acute biventricular heart failure with cardiogenic shock   EF 20 to 25% with global hypokinesis Severe PH, WHO group 2 NSTEMI: 3-vessel severe CAD Severe AS, Mod-Sev TR, Mod MR Frequent ectopy, at risk for malignant arrhythmias Plan Mechanical assistance as directed by heart failure team  continuing norepinephrine, shooting for ceiling of 10 mcg, continue vasopressin at 0.03 as directed by heart failure team Continue volume removal  Continue telemetry monitoring  Not a candidate for antihypertensives or goal-directed therapy at this time given shock state Continuing amiodarone infusion  Continue IV  heparin Continue aspirin, continue Brilinta Continue Lipitor Considering TAVR in near future  ESRD on HD Plan Continue CRRT Serial chemistries  Acute resp failure with hypoxia due to pulmonary edema He is now -6 L  plan Continuing volume removal via CRRT Continue to wean supplemental oxygen Continue pulse oximetry Add IS  Hx aortitis Hx clostridium septicum bacteremia  History of noncompliance with his chronic suppressive antibiotics Plan  continue chronic Augmentin  Hx aortic pseudoaneurysm s/p stents -- aorta, common iliac R and L iliac, external iliac  Plan  Continue aspirin and statin Anticipate the need for vascular surgery follow-up after needing MCS  Chronic anemia Chronic thrombocytopenia  Plan  transfuse for hemoglobin less than 8 given concern for subacute cardiac ischemia Continue to monitor for evidence of bleeding  History of CVA Plan  Continue aspirin and statin  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 Goals of care -He indicated DNR status 9/25, cards d/w him that he would need to be full code for high-risk intervention.  Plan We need to revisit CODE STATUS in the very near future given unlikely to be a candidate for further intervention  Best practice (evaluated daily):  Per Primary Team  Simonne Martinet ACNP-BC [facility] Pulmonary/Critical Care Pager # [phone] OR # [phone] if no answer

## 2023-04-17 NOTE — Progress Notes (Signed)
Initial Nutrition Assessment  DOCUMENTATION CODES:   Non-severe (moderate) malnutrition in context of chronic illness  INTERVENTION:   Liberalize diet to Regular  Ensure Enlive po TID, each supplement provides 350 kcal and 20 grams of protein.  Magic cup BID with meals, each supplement provides 290 kcal and 9 grams of protein  Renal MVI BID while on CRRT  Assistance with meals as needed given inability to sit up/bend at hip until Impella removed; good positioning via reverse trendelenburg on visit today.     NUTRITION DIAGNOSIS:   Moderate Malnutrition related to chronic illness as evidenced by moderate fat depletion, moderate muscle depletion.  GOAL:   Patient will meet greater than or equal to 90% of their needs  MONITOR:   PO intake, Supplement acceptance  REASON FOR ASSESSMENT:   Consult Assessment of nutrition requirement/status  ASSESSMENT:   82 yo male admitted with acute on chronic biventricular HF  with cardiogenic shock, NSTEMI. 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 9/24 Tx to [facility] CVICU 9/26 palliative care consult 9/27 CRRT initiated, advanced HF consulted May 04, 2023 high risk PCI, Impella assisted balloon aortic valvuloplasty   Noted plan for TAVR eventually  Pt alert on visit today, reverse trendelenburg position currently. Impella remains in place but possible removal per RN  Remains on CRRT Levophed at 7, vasopressin at 0.02. Milrinone and amiodarone gtt as well  Noted pt with vomiting post procedure last night but none today. Pt reports appetite is fair, lunch tray at bedside and pt did not eat much. Pt did eat some bites of pot roast which he enjoyed. Very limited documentation of po intake with only 2 meals recorded (from 9/25), 50-75%  Pt drinks Premier Protein shakes at home; discussed importance of adequate protein in addition to adequate calories during acute illness and while on CRRT.  Discussed use of high calorie/high protein supplement such as Ensure/Nepro/Boost for now. Pt and family agreeable to this; does not like chocolate  Pt reports he takes Rena-Vite at home daily; encouraged pt to continue to do so at d/c. While on CRRT, increasing renal MVI to BID  Current wt 85.5 kg; dry weight not documented. Admission wt 89.1 kg, weight up to 91.3 kg on 9/28.   Anuric; 2.5 L volume removed via CRRT in 24 hours. Net negative 6L since admission  Noted pt takes [place] but currently on hold, phos ok and on CRRT  Labs: phosphorus 3.8 (wdl), potassium 4.8 (wdl), sodium 133 (L), magnesium 2.4 (wdl) Meds: reviewed   NUTRITION - FOCUSED PHYSICAL EXAM:  Flowsheet Row Most Recent Value  Orbital Region Moderate depletion  Upper Arm Region Moderate depletion  Thoracic and Lumbar Region Severe depletion  Buccal Region Moderate depletion  Temple Region Severe depletion  Clavicle Bone Region Moderate depletion  Clavicle and Acromion Bone Region Moderate depletion  Scapular Bone Region Severe depletion  Dorsal Hand Severe depletion  Patellar Region Moderate depletion  Anterior Thigh Region Moderate depletion  Posterior Calf Region Moderate depletion  Edema (RD Assessment) Mild       Diet Order:  HEART HEALTHY   EDUCATION NEEDS:   Education needs have been addressed  Skin:  Skin Assessment: Reviewed RN Assessment  Last BM:  9/29 type 5 large  Height:   Ht Readings from Last 1 Encounters:  04/11/23 6\' 1"  (1.854 m)    Weight:   Wt Readings from Last 1 Encounters:  04/17/23 85.5 kg  BMI:  Body mass index is 24.87 kg/m.  Estimated Nutritional Needs:   Kcal:  1900-2100 kcals  Protein:  100-120 g  Fluid:  1L plus UOP   Romelle Starcher MS, RDN, LDN, CNSC Registered Dietitian 3 Clinical Nutrition RD Pager and On-Call Pager Number Located in [place]

## 2023-04-17 NOTE — Progress Notes (Signed)
At [facility] with milrinone 0.135mcg/kg/min; vaso 0.02 and levophed 7-10 mcg improvement in mixed venous to 63. eFick >2.5L/min/m2. Will decrease to P2 with repeat COOX & lactic acid in 2-3 hours.   Jonathan Mcgrath 12:36 PM

## 2023-04-17 NOTE — TOC Benefit Eligibility Note (Signed)
Patient Product/process development scientist completed.    The patient is insured through [employer]. Patient has Medicare and is not eligible for a copay card, but may be able to apply for patient assistance, if available.    Ran test claim for Brilinta 90 mg and the current 30 day co-pay is $45.00.   This test claim was processed through [facility]- copay amounts may vary at other pharmacies due to pharmacy/plan contracts, or as the patient moves through the different stages of their insurance plan.     Roland Earl, CPHT Pharmacy Technician III Certified Patient Advocate [facility] Patient Advocate Team Direct Number: [phone]  Fax: [phone]

## 2023-04-17 DEATH — deceased

## 2023-04-18 ENCOUNTER — Inpatient Hospital Stay (HOSPITAL_COMMUNITY): Payer: Medicare HMO

## 2023-04-18 DIAGNOSIS — E440 Moderate protein-calorie malnutrition: Secondary | ICD-10-CM | POA: Insufficient documentation

## 2023-04-18 LAB — COOXEMETRY PANEL
Carboxyhemoglobin: 1.6 % — ABNORMAL HIGH (ref 0.5–1.5)
Methemoglobin: <0.7 % (ref 0.0–1.5)
O2 Saturation: 67.4 %
Total hemoglobin: 7 g/dL — ABNORMAL LOW (ref 12.0–16.0)

## 2023-04-18 LAB — CBC
HCT: 26.7 % — ABNORMAL LOW (ref 39.0–52.0)
Hemoglobin: 8.6 g/dL — ABNORMAL LOW (ref 13.0–17.0)
MCH: 32.5 pg (ref 26.0–34.0)
MCHC: 32.2 g/dL (ref 30.0–36.0)
MCV: 100.8 fL — ABNORMAL HIGH (ref 80.0–100.0)
Platelets: 133 10*3/uL — ABNORMAL LOW (ref 150–400)
RBC: 2.65 MIL/uL — ABNORMAL LOW (ref 4.22–5.81)
RDW: 16.9 % — ABNORMAL HIGH (ref 11.5–15.5)
WBC: 27.9 10*3/uL — ABNORMAL HIGH (ref 4.0–10.5)
nRBC: 7.7 % — ABNORMAL HIGH (ref 0.0–0.2)

## 2023-04-18 LAB — POCT I-STAT 7, (LYTES, BLD GAS, ICA,H+H)
Acid-base deficit: 2 mmol/L (ref 0.0–2.0)
Bicarbonate: 21.9 mmol/L (ref 20.0–28.0)
Calcium, Ion: 1.15 mmol/L (ref 1.15–1.40)
HCT: 28 % — ABNORMAL LOW (ref 39.0–52.0)
Hemoglobin: 9.5 g/dL — ABNORMAL LOW (ref 13.0–17.0)
O2 Saturation: 95 %
Patient temperature: 37.1
Potassium: 5.1 mmol/L (ref 3.5–5.1)
Sodium: 134 mmol/L — ABNORMAL LOW (ref 135–145)
TCO2: 23 mmol/L (ref 22–32)
pCO2 arterial: 34.6 mm[Hg] (ref 32–48)
pH, Arterial: 7.41 (ref 7.35–7.45)
pO2, Arterial: 73 mm[Hg] — ABNORMAL LOW (ref 83–108)

## 2023-04-18 LAB — RENAL FUNCTION PANEL
Albumin: 3 g/dL — ABNORMAL LOW (ref 3.5–5.0)
Anion gap: 14 (ref 5–15)
BUN: 18 mg/dL (ref 8–23)
CO2: 23 mmol/L (ref 22–32)
Calcium: 8.7 mg/dL — ABNORMAL LOW (ref 8.9–10.3)
Chloride: 98 mmol/L (ref 98–111)
Creatinine, Ser: 2.66 mg/dL — ABNORMAL HIGH (ref 0.61–1.24)
GFR, Estimated: 23 mL/min — ABNORMAL LOW (ref >60)
Glucose, Bld: 191 mg/dL — ABNORMAL HIGH (ref 70–99)
Phosphorus: 2.9 mg/dL (ref 2.5–4.6)
Potassium: 4.6 mmol/L (ref 3.5–5.1)
Sodium: 135 mmol/L (ref 135–145)

## 2023-04-18 LAB — GLUCOSE, CAPILLARY: Glucose-Capillary: 227 mg/dL — ABNORMAL HIGH (ref 70–99)

## 2023-04-18 LAB — CG4 I-STAT (LACTIC ACID): Lactic Acid, Venous: 1.6 mmol/L (ref 0.5–1.9)

## 2023-04-18 LAB — MAGNESIUM: Magnesium: 2.4 mg/dL (ref 1.7–2.4)

## 2023-04-18 LAB — PROCALCITONIN: Procalcitonin: 0.85 ng/mL

## 2023-04-18 MED ORDER — EPINEPHRINE HCL 5 MG/250ML IV SOLN IN NS
0.5000 ug/min | INTRAVENOUS | Status: DC
  Administered 2023-04-18: 2 ug/min via INTRAVENOUS
  Filled 2023-04-18: qty 250

## 2023-04-18 MED ORDER — EPINEPHRINE 1 MG/10ML IJ SOSY
PREFILLED_SYRINGE | INTRAMUSCULAR | Status: AC | PRN
Start: 2023-04-18 — End: 2023-04-18
  Administered 2023-04-18 (×3): 1 mg via INTRAVENOUS

## 2023-05-18 NOTE — Progress Notes (Signed)
Cards provider paged multiple times by primary RN and E-Link MD. Awaiting response

## 2023-05-18 NOTE — Progress Notes (Signed)
Contacted [facility] to advise clinic staff of pt's passing.   Olivia Canter Renal Navigator [phone]

## 2023-05-18 NOTE — Death Summary Note (Cosign Needed Addendum)
Advanced Heart Failure Death Summary  Death Summary   Patient ID: Jonathan Mcgrath MRN: [id number], DOB/AGE: 07-17-1941 82 y.o. Admit date: 03/22/2023 D/C date:     04/22/2023   Primary Discharge Diagnoses:  Cardiac arrest Suspected aspiration Cardiogenic shock with biventricular heart failure NSTEMI with underlying CAD Aortic valve stenosis ESRD on HD  Hospital Course:  Jonathan Mcgrath is a 82 y.o. male with HTN, HLP, ESRD on HD since 2016, colon cancer s/p hemicolectomy 10/19, anemia, DM, PAD s/p bilateral iliac stenting, aortitis on chronic suppressive antibiotics.    Admitted to [facility] under Cardiology Apr 16, 2023 with NSTEMI. R/LHC with critical ostial LAD and Lcx disease, moderate proximal RCA and severe RPAV disease, severe pulmonary venous HTN with PA pressure in 80s, PCWP 30 mmHg, TD CI 2.6. Echo with EF 20-25%, RV moderately reduced, RVSP 55-60 mmHg, moderate MR, moderate to severe TR, severe low flow low gradient AS. He was not felt to be a candidate for CABG/SAVR. He was transferred to [facility] for consideration of high-risk PCI with Impella support and possible TAVR at later date. Advanced Heart Failure consulted for pre-procedure optimization. Palliative care assisted with GOC discussion. He desired to be DNR (no compressions, okay for shocks and intubation), but opted to reverse to full code for intervention, then revert back to DNR.  Volume could not be managed effectively with iHD and he was transitioned to CRRT. Hemodynamics stabilized with addition of NE and Vasopressin. He underwent Impella assisted aortic valvuloplasty and high risk PCI/atherectomy to LM/LAD/Lcx on 09/30 with excellent results. Hemodynamics remained stable with Impella wean and Impella was removed on afternoon of 10/01.   Overnight, he had multiple episodes of emesis and rising pressor requirements. Worsening leukocytosis noted, WBCs 14>26K. Blood cultures obtained. Around 6 am he became unresponsive with agonal  breathing. Code blue activated. Bag mask ventilation started.  Became pulseless and received epinephrine. One shock delivered for VT/V-fib with no change in rhythm. Daughter contacted and it was confirmed that she did not wish to continue further resuscitation efforts. Time of death 6:15 AM.    Significant Diagnostic Studies  Impella assisted aortic valvuloplasty and PCI 03/26/2023:    Mid LM to Ost LAD lesion is 50% stenosed with 95% stenosed side branch in Ost Cx.   Prox LAD to Mid LAD lesion is 40% stenosed.   Ost LAD lesion is 90% stenosed.   Prox RCA lesion is 60% stenosed.   1st Mrg lesion is 40% stenosed.   2nd Mrg lesion is 50% stenosed.   RPAV lesion is 90% stenosed.   A stent was successfully placed.   A stent was successfully placed.   Post intervention, there is a 0% residual stenosis.   Post intervention, there is a 0% residual stenosis.   Post intervention, there is a 0% residual stenosis.   Post intervention, the side branch was reduced to 0% residual stenosis.   1.  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 2.  Fick cardiac output of 6.9 L/min and cardiac index of 3.2 L/min/m prior to Impella placement while on norepinephrine and vasopressin with the following hemodynamics:             Right atrial pressure mean of 11 mmHg             Right ventricular pressure 76/0 with an end-diastolic pressure of 11 mmHg  Pulmonary artery pressure of 85/23 with a mean of 44 mmHg             Wedge pressure mean of 20 mmHg             Pulmonary vascular resistance of 3.5 Woods units             Pulmonary artery pulsatility index of 5.6 3.  LVEDP of 16 mmHg 4.  Successful Impella assisted balloon aortic valvuloplasty with a 10 mm balloon. 5.  Successful complex high risk PCI with Impella support, orbital atherectomy of LAD  and left circumflex guided by OCT, Cutting Balloon angioplasty of LAD and left circumflex, and DK Nano crush two stent bifurcation strategy. 6.  Given hypotension from sedation, planned Impella removal was aborted and the Impella will remain in place at least overnight.  This was discussed with advanced heart failure.   Recommendation: Monitor in unit and wean Impella as tolerated; if the patient is able to wean off the Impella and can convert to conventional hemodialysis, will obtain TAVR protocol CTA this admission for possible TAVR in the near future.    Echo 04/11/2023: IMPRESSIONS   1. Left ventricular ejection fraction, by estimation, is 20 to 25%. The left ventricle has severely decreased function. The left ventricle demonstrates global hypokinesis. The left ventricular internal cavity size was mildly dilated. There is mild left ventricular hypertrophy. Left ventricular diastolic parameters are indeterminate. The average left ventricular global longitudinal strain is  -5.3 %. The global longitudinal strain is abnormal.   2. 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 Right ventricular systolic function is moderately reduced. The right ventricular size is mildly enlarged.   3. Left atrial size was mildly dilated.   4. A small pericardial effusion is present.   5. The mitral valve is normal in structure. Moderate mitral valve regurgitation.   6. Tricuspid valve regurgitation is moderate to severe.   7. The aortic valve has an indeterminant number of cusps. There is severe  calcifcation of the aortic valve. There is severe thickening of the aortic valve. Aortic valve regurgitation is trivial. There is severe  low-flow/low-gradient aortic valve stenosis.   R/LHC 04/05/2023: Conclusions: Critical ostial LAD/LCx disease with 90-95% stenosis.  There is also moderate proximal RCA and severe RPAV disease. Severely elevated left and  right heart filling pressures. Severe pulmonary hypertension. Normal Fick cardiac output/index, likely elevated by AV fistula. Patent bilateral common iliac artery stents with mild restenosis at the ostium of both vessels.  Aorta is patent with mild luminal irregularities.   Recommendations: Transfer to [facility] for cardiac surgery evaluation.  If transfer cannot be performed in the next hour or 2, recommend monitoring in the ICU at [facility]. If the patient becomes hemodynamically unstable or develops angina refractory to medical therapy, IABP placement will need to be considered. Resume heparin infusion 8 hours after right femoral artery hemostasis was achieved. Consider initiation of CVVH for fluid removal, given that the patient is anuric and soft blood pressure and severe AS limit intermittent HD.    Consultations  Advanced Heart Failure Pulmonary/Critical Care Medicine Nephrology Palliative Care  Duration of Discharge Encounter: Greater than 35 minutes   Signed, Ranesha Val N, PA-C 04/20/2023, 12:01 PM

## 2023-05-18 NOTE — Progress Notes (Signed)
Chaplain responded to Code Blue.  Family has been called.  Chaplain on standby to offer support if needed.  Vernell Morgans Chaplain

## 2023-05-18 NOTE — Code Documentation (Signed)
Patient Name: Jonathan Mcgrath   MRN: [id number]   Date of Birth/ Sex: 1941/02/13 , male      Admission Date: 03/23/2023  Attending Provider: Orbie Pyo, MD  Primary Diagnosis: NSTEMI (non-ST elevated myocardial infarction) [facility]) [I21.4]   Indication: Pt was in his usual state of health until this AM, when he started becoming hypotensive and went into PEA. Code blue was subsequently called. At the time of arrival on scene, ACLS protocol was underway.   Technical Description:  - CPR performance duration:  Not applicable as patient is DNR   - Was defibrillation or cardioversion used? Yes   - Was external pacer placed? No  - Was patient intubated pre/post CPR? No   Medications Administered: Y = Yes; Blank = No Amiodarone    Atropine    Calcium    Epinephrine  Y x2  Lidocaine    Magnesium    Norepinephrine    Phenylephrine    Sodium bicarbonate    Vasopressin    Other    Post CPR evaluation:  - Final Status - Was patient successfully resuscitated ? No   Miscellaneous Information:  - Time of death:  6:15 AM  - Primary team notified?  Yes  - Family Notified? PCCM Conrad Homestead, MD  called family      Kathleen Lime, MD   05/12/2023, 6:29 AM

## 2023-05-18 NOTE — Progress Notes (Signed)
2330- Pt continued to have increased pressor needs, Elink notified.See new orders CRRT stopped at this time due to catheter issues, See TPA documentation  0145- CRRT restated  0300- CRRT stopped again due to access issues  0330- patient having multiple episodes of emesis and became increasingly hypotensive  RN Contacted Elink and paged cards fellow, [place],MD x 2 See New orders  0500- Epi gtt started with no improvement- 0520 & 0539- RN paged cards fellow x2 with no response Elink notified for further orders, See separate note 0600- pt started having agonal breaths and became unresponsive Code blue called See code documentation TOD- 0615

## 2023-05-18 DEATH — deceased

## 2023-12-20 ENCOUNTER — Ambulatory Visit: Payer: Medicare HMO | Admitting: Infectious Diseases
